# Patient Record
Sex: Female | Born: 1935 | Race: Black or African American | Hispanic: No | Marital: Single | Smoking: Current some day smoker
Health system: Southern US, Community
[De-identification: ages and names within clinical notes are randomized; demographics above are authoritative.]

## PROBLEM LIST (undated history)

## (undated) DIAGNOSIS — T380X5A Adverse effect of glucocorticoids and synthetic analogues, initial encounter: Secondary | ICD-10-CM

## (undated) DIAGNOSIS — Z8701 Personal history of pneumonia (recurrent): Secondary | ICD-10-CM

## (undated) DIAGNOSIS — I471 Supraventricular tachycardia: Secondary | ICD-10-CM

## (undated) DIAGNOSIS — K922 Gastrointestinal hemorrhage, unspecified: Secondary | ICD-10-CM

## (undated) DIAGNOSIS — I422 Other hypertrophic cardiomyopathy: Secondary | ICD-10-CM

## (undated) DIAGNOSIS — I517 Cardiomegaly: Secondary | ICD-10-CM

## (undated) DIAGNOSIS — I480 Paroxysmal atrial fibrillation: Secondary | ICD-10-CM

## (undated) DIAGNOSIS — M069 Rheumatoid arthritis, unspecified: Secondary | ICD-10-CM

## (undated) DIAGNOSIS — K529 Noninfective gastroenteritis and colitis, unspecified: Secondary | ICD-10-CM

## (undated) DIAGNOSIS — M545 Low back pain, unspecified: Secondary | ICD-10-CM

## (undated) DIAGNOSIS — D61818 Other pancytopenia: Secondary | ICD-10-CM

## (undated) DIAGNOSIS — K754 Autoimmune hepatitis: Secondary | ICD-10-CM

## (undated) DIAGNOSIS — I10 Essential (primary) hypertension: Secondary | ICD-10-CM

## (undated) DIAGNOSIS — I4719 Other supraventricular tachycardia: Secondary | ICD-10-CM

## (undated) DIAGNOSIS — M792 Neuralgia and neuritis, unspecified: Secondary | ICD-10-CM

## (undated) DIAGNOSIS — K7589 Other specified inflammatory liver diseases: Secondary | ICD-10-CM

## (undated) DIAGNOSIS — Z8719 Personal history of other diseases of the digestive system: Secondary | ICD-10-CM

## (undated) DIAGNOSIS — N183 Chronic kidney disease, stage 3 (moderate): Principal | ICD-10-CM

## (undated) DIAGNOSIS — G720 Drug-induced myopathy: Secondary | ICD-10-CM

## (undated) DIAGNOSIS — K633 Ulcer of intestine: Secondary | ICD-10-CM

## (undated) DIAGNOSIS — I639 Cerebral infarction, unspecified: Secondary | ICD-10-CM

## (undated) DIAGNOSIS — B3781 Candidal esophagitis: Secondary | ICD-10-CM

## (undated) DIAGNOSIS — J40 Bronchitis, not specified as acute or chronic: Secondary | ICD-10-CM

## (undated) DIAGNOSIS — K259 Gastric ulcer, unspecified as acute or chronic, without hemorrhage or perforation: Secondary | ICD-10-CM

## (undated) DIAGNOSIS — I4891 Unspecified atrial fibrillation: Secondary | ICD-10-CM

## (undated) DIAGNOSIS — N200 Calculus of kidney: Secondary | ICD-10-CM

## (undated) DIAGNOSIS — I441 Atrioventricular block, second degree: Secondary | ICD-10-CM

## (undated) DIAGNOSIS — M1990 Unspecified osteoarthritis, unspecified site: Secondary | ICD-10-CM

## (undated) DIAGNOSIS — K31819 Angiodysplasia of stomach and duodenum without bleeding: Secondary | ICD-10-CM

## (undated) DIAGNOSIS — D631 Anemia in chronic kidney disease: Secondary | ICD-10-CM

## (undated) DIAGNOSIS — I4892 Unspecified atrial flutter: Secondary | ICD-10-CM

## (undated) DIAGNOSIS — D509 Iron deficiency anemia, unspecified: Secondary | ICD-10-CM

## (undated) HISTORY — DX: Bronchitis, not specified as acute or chronic: J40

## (undated) HISTORY — DX: Noninfective gastroenteritis and colitis, unspecified: K52.9

## (undated) HISTORY — PX: CYST REMOVAL HAND: SHX6279

## (undated) HISTORY — DX: Paroxysmal atrial fibrillation: I48.0

## (undated) HISTORY — DX: Atrioventricular block, second degree: I44.1

## (undated) HISTORY — PX: OTHER SURGICAL HISTORY: SHX169

## (undated) HISTORY — DX: Other specified inflammatory liver diseases: K75.89

## (undated) HISTORY — DX: Unspecified osteoarthritis, unspecified site: M19.90

## (undated) HISTORY — DX: Chronic kidney disease, stage 3 (moderate): N18.3

## (undated) HISTORY — DX: Anemia in chronic kidney disease: D63.1

---

## 2000-12-11 ENCOUNTER — Encounter: Payer: Self-pay | Admitting: Family Medicine

## 2000-12-11 ENCOUNTER — Ambulatory Visit (HOSPITAL_COMMUNITY): Admission: RE | Admit: 2000-12-11 | Discharge: 2000-12-11 | Payer: Self-pay | Admitting: Specialist

## 2000-12-15 ENCOUNTER — Other Ambulatory Visit: Admission: RE | Admit: 2000-12-15 | Discharge: 2000-12-15 | Payer: Self-pay | Admitting: Family Medicine

## 2001-12-17 ENCOUNTER — Ambulatory Visit (HOSPITAL_COMMUNITY): Admission: RE | Admit: 2001-12-17 | Discharge: 2001-12-17 | Payer: Self-pay | Admitting: Family Medicine

## 2001-12-17 ENCOUNTER — Encounter: Payer: Self-pay | Admitting: Family Medicine

## 2002-12-23 ENCOUNTER — Ambulatory Visit (HOSPITAL_COMMUNITY): Admission: RE | Admit: 2002-12-23 | Discharge: 2002-12-23 | Payer: Self-pay | Admitting: Family Medicine

## 2004-01-13 ENCOUNTER — Ambulatory Visit (HOSPITAL_COMMUNITY): Admission: RE | Admit: 2004-01-13 | Discharge: 2004-01-13 | Payer: Self-pay | Admitting: Family Medicine

## 2004-03-04 ENCOUNTER — Ambulatory Visit (HOSPITAL_COMMUNITY): Admission: RE | Admit: 2004-03-04 | Discharge: 2004-03-04 | Payer: Self-pay | Admitting: General Surgery

## 2005-01-13 ENCOUNTER — Ambulatory Visit (HOSPITAL_COMMUNITY): Admission: RE | Admit: 2005-01-13 | Discharge: 2005-01-13 | Payer: Self-pay | Admitting: Family Medicine

## 2006-01-17 ENCOUNTER — Ambulatory Visit (HOSPITAL_COMMUNITY): Admission: RE | Admit: 2006-01-17 | Discharge: 2006-01-17 | Payer: Self-pay | Admitting: Family Medicine

## 2006-02-14 ENCOUNTER — Other Ambulatory Visit: Admission: RE | Admit: 2006-02-14 | Discharge: 2006-02-14 | Payer: Self-pay | Admitting: Family Medicine

## 2007-02-26 ENCOUNTER — Ambulatory Visit (HOSPITAL_COMMUNITY): Admission: RE | Admit: 2007-02-26 | Discharge: 2007-02-26 | Payer: Self-pay | Admitting: Family Medicine

## 2008-03-28 ENCOUNTER — Ambulatory Visit (HOSPITAL_COMMUNITY): Admission: RE | Admit: 2008-03-28 | Discharge: 2008-03-28 | Payer: Self-pay | Admitting: Family Medicine

## 2009-04-10 ENCOUNTER — Ambulatory Visit (HOSPITAL_COMMUNITY): Admission: RE | Admit: 2009-04-10 | Discharge: 2009-04-10 | Payer: Self-pay | Admitting: Family Medicine

## 2009-06-17 ENCOUNTER — Ambulatory Visit (HOSPITAL_COMMUNITY): Admission: RE | Admit: 2009-06-17 | Discharge: 2009-06-17 | Payer: Self-pay | Admitting: General Surgery

## 2009-11-27 ENCOUNTER — Emergency Department (HOSPITAL_COMMUNITY): Admission: EM | Admit: 2009-11-27 | Discharge: 2009-11-27 | Payer: Self-pay | Admitting: Emergency Medicine

## 2009-11-28 ENCOUNTER — Emergency Department (HOSPITAL_COMMUNITY): Admission: EM | Admit: 2009-11-28 | Discharge: 2009-11-29 | Payer: Self-pay | Admitting: Emergency Medicine

## 2009-12-03 ENCOUNTER — Encounter: Admission: RE | Admit: 2009-12-03 | Discharge: 2009-12-03 | Payer: Self-pay | Admitting: Family Medicine

## 2010-03-16 LAB — POCT I-STAT, CHEM 8
Calcium, Ion: 1.23 mmol/L (ref 1.12–1.32)
Glucose, Bld: 124 mg/dL — ABNORMAL HIGH (ref 70–99)
HCT: 28 % — ABNORMAL LOW (ref 36.0–46.0)
Hemoglobin: 9.5 g/dL — ABNORMAL LOW (ref 12.0–15.0)
TCO2: 26 mmol/L (ref 0–100)

## 2010-03-19 ENCOUNTER — Other Ambulatory Visit (HOSPITAL_COMMUNITY): Payer: Self-pay | Admitting: Family Medicine

## 2010-03-19 DIAGNOSIS — Z139 Encounter for screening, unspecified: Secondary | ICD-10-CM

## 2010-03-22 LAB — DIFFERENTIAL
Basophils Relative: 0 % (ref 0–1)
Eosinophils Absolute: 0.3 10*3/uL (ref 0.0–0.7)
Lymphs Abs: 1.2 10*3/uL (ref 0.7–4.0)
Neutro Abs: 3.9 10*3/uL (ref 1.7–7.7)
Neutrophils Relative %: 68 % (ref 43–77)

## 2010-03-22 LAB — CBC
MCV: 69.1 fL — ABNORMAL LOW (ref 78.0–100.0)
Platelets: 169 10*3/uL (ref 150–400)
WBC: 5.8 10*3/uL (ref 4.0–10.5)

## 2010-03-22 LAB — BASIC METABOLIC PANEL
BUN: 13 mg/dL (ref 6–23)
Calcium: 9.7 mg/dL (ref 8.4–10.5)
Chloride: 104 mEq/L (ref 96–112)
Creatinine, Ser: 1.06 mg/dL (ref 0.4–1.2)

## 2010-03-22 LAB — SURGICAL PCR SCREEN
MRSA, PCR: NEGATIVE
Staphylococcus aureus: NEGATIVE

## 2010-04-04 DIAGNOSIS — K259 Gastric ulcer, unspecified as acute or chronic, without hemorrhage or perforation: Secondary | ICD-10-CM

## 2010-04-04 DIAGNOSIS — K633 Ulcer of intestine: Secondary | ICD-10-CM

## 2010-04-04 HISTORY — PX: UPPER GASTROINTESTINAL ENDOSCOPY: SHX188

## 2010-04-04 HISTORY — DX: Gastric ulcer, unspecified as acute or chronic, without hemorrhage or perforation: K25.9

## 2010-04-04 HISTORY — DX: Ulcer of intestine: K63.3

## 2010-04-04 HISTORY — PX: COLONOSCOPY: SHX174

## 2010-04-12 ENCOUNTER — Ambulatory Visit (HOSPITAL_COMMUNITY)
Admission: RE | Admit: 2010-04-12 | Discharge: 2010-04-12 | Disposition: A | Discharge status: Home / Self Care | Payer: Medicare Other | Source: Ambulatory Visit | Attending: Family Medicine | Admitting: Family Medicine

## 2010-04-12 DIAGNOSIS — Z139 Encounter for screening, unspecified: Secondary | ICD-10-CM

## 2010-04-12 DIAGNOSIS — Z1231 Encounter for screening mammogram for malignant neoplasm of breast: Secondary | ICD-10-CM | POA: Insufficient documentation

## 2010-04-22 ENCOUNTER — Inpatient Hospital Stay (HOSPITAL_COMMUNITY)
Admission: AD | Admit: 2010-04-22 | Discharge: 2010-04-24 | DRG: 811 | Disposition: A | Discharge status: Home / Self Care | Payer: Medicare Other | Source: Ambulatory Visit | Attending: Otolaryngology | Admitting: Otolaryngology

## 2010-04-22 DIAGNOSIS — K633 Ulcer of intestine: Secondary | ICD-10-CM | POA: Diagnosis present

## 2010-04-22 DIAGNOSIS — I10 Essential (primary) hypertension: Secondary | ICD-10-CM | POA: Diagnosis present

## 2010-04-22 DIAGNOSIS — K254 Chronic or unspecified gastric ulcer with hemorrhage: Secondary | ICD-10-CM | POA: Diagnosis present

## 2010-04-22 DIAGNOSIS — D500 Iron deficiency anemia secondary to blood loss (chronic): Principal | ICD-10-CM | POA: Diagnosis present

## 2010-04-22 DIAGNOSIS — D61818 Other pancytopenia: Secondary | ICD-10-CM | POA: Diagnosis present

## 2010-04-22 DIAGNOSIS — J069 Acute upper respiratory infection, unspecified: Secondary | ICD-10-CM | POA: Diagnosis present

## 2010-04-22 DIAGNOSIS — K5730 Diverticulosis of large intestine without perforation or abscess without bleeding: Secondary | ICD-10-CM | POA: Diagnosis present

## 2010-04-22 DIAGNOSIS — I509 Heart failure, unspecified: Secondary | ICD-10-CM | POA: Diagnosis present

## 2010-04-22 DIAGNOSIS — T394X5A Adverse effect of antirheumatics, not elsewhere classified, initial encounter: Secondary | ICD-10-CM | POA: Diagnosis present

## 2010-04-22 LAB — COMPREHENSIVE METABOLIC PANEL
Albumin: 2.7 g/dL — ABNORMAL LOW (ref 3.5–5.2)
BUN: 12 mg/dL (ref 6–23)
Creatinine, Ser: 1.25 mg/dL — ABNORMAL HIGH (ref 0.4–1.2)
GFR calc Af Amer: 50 mL/min — ABNORMAL LOW (ref >60)
Potassium: 4.5 mEq/L (ref 3.5–5.1)
Total Protein: 7.1 g/dL (ref 6.0–8.3)

## 2010-04-22 LAB — CBC
HCT: 16.2 % — ABNORMAL LOW (ref 36.0–46.0)
Hemoglobin: 4.6 g/dL — CL (ref 12.0–15.0)
RBC: 2.83 MIL/uL — ABNORMAL LOW (ref 3.87–5.11)
WBC: 2.5 10*3/uL — ABNORMAL LOW (ref 4.0–10.5)

## 2010-04-22 LAB — PROTIME-INR: INR: 1.11 (ref 0.00–1.49)

## 2010-04-22 LAB — APTT: aPTT: 30 seconds (ref 24–37)

## 2010-04-22 LAB — DIFFERENTIAL
Eosinophils Relative: 0 % (ref 0–5)
Monocytes Relative: 12 % (ref 3–12)
Neutrophils Relative %: 43 % (ref 43–77)

## 2010-04-22 LAB — HEMOCCULT GUIAC POC 1CARD (OFFICE): Fecal Occult Bld: POSITIVE

## 2010-04-22 LAB — LACTATE DEHYDROGENASE: LDH: 153 U/L (ref 94–250)

## 2010-04-23 ENCOUNTER — Inpatient Hospital Stay (HOSPITAL_COMMUNITY): Payer: Medicare Other

## 2010-04-23 DIAGNOSIS — R195 Other fecal abnormalities: Secondary | ICD-10-CM

## 2010-04-23 DIAGNOSIS — K253 Acute gastric ulcer without hemorrhage or perforation: Secondary | ICD-10-CM

## 2010-04-23 DIAGNOSIS — D509 Iron deficiency anemia, unspecified: Secondary | ICD-10-CM

## 2010-04-23 DIAGNOSIS — Z791 Long term (current) use of non-steroidal anti-inflammatories (NSAID): Secondary | ICD-10-CM

## 2010-04-23 DIAGNOSIS — K2960 Other gastritis without bleeding: Secondary | ICD-10-CM

## 2010-04-23 LAB — CBC
HCT: 21.9 % — ABNORMAL LOW (ref 36.0–46.0)
Hemoglobin: 7.1 g/dL — ABNORMAL LOW (ref 12.0–15.0)
MCV: 63.7 fL — ABNORMAL LOW (ref 78.0–100.0)
RBC: 3.44 MIL/uL — ABNORMAL LOW (ref 3.87–5.11)
WBC: 3.8 10*3/uL — ABNORMAL LOW (ref 4.0–10.5)

## 2010-04-23 LAB — LIPID PANEL
Cholesterol: 108 mg/dL (ref 0–200)
LDL Cholesterol: 79 mg/dL (ref 0–99)
Total CHOL/HDL Ratio: 6.4 RATIO
VLDL: 12 mg/dL (ref 0–40)

## 2010-04-23 LAB — DIFFERENTIAL
Basophils Absolute: 0 10*3/uL (ref 0.0–0.1)
Eosinophils Relative: 3 % (ref 0–5)
Lymphocytes Relative: 35 % (ref 12–46)
Monocytes Relative: 12 % (ref 3–12)
Neutrophils Relative %: 50 % (ref 43–77)

## 2010-04-23 LAB — HEMOGLOBIN AND HEMATOCRIT, BLOOD: HCT: 25.9 % — ABNORMAL LOW (ref 36.0–46.0)

## 2010-04-23 LAB — IRON AND TIBC: UIBC: 352 ug/dL

## 2010-04-23 LAB — BASIC METABOLIC PANEL
BUN: 8 mg/dL (ref 6–23)
Calcium: 8.9 mg/dL (ref 8.4–10.5)
Chloride: 107 mEq/L (ref 96–112)
Creatinine, Ser: 1.07 mg/dL (ref 0.4–1.2)
GFR calc Af Amer: >60 mL/min (ref >60)

## 2010-04-23 LAB — VITAMIN B12: Vitamin B-12: 417 pg/mL (ref 211–911)

## 2010-04-23 LAB — TSH: TSH: 0.737 u[IU]/mL (ref 0.350–4.500)

## 2010-04-23 LAB — FERRITIN: Ferritin: 9 ng/mL — ABNORMAL LOW (ref 10–291)

## 2010-04-23 LAB — FOLATE: Folate: 9.9 ng/mL

## 2010-04-24 ENCOUNTER — Other Ambulatory Visit (INDEPENDENT_AMBULATORY_CARE_PROVIDER_SITE_OTHER): Payer: Self-pay | Admitting: Internal Medicine

## 2010-04-24 DIAGNOSIS — K633 Ulcer of intestine: Secondary | ICD-10-CM

## 2010-04-24 DIAGNOSIS — K5730 Diverticulosis of large intestine without perforation or abscess without bleeding: Secondary | ICD-10-CM

## 2010-04-24 LAB — CBC
Hemoglobin: 8.2 g/dL — ABNORMAL LOW (ref 12.0–15.0)
Platelets: 132 10*3/uL — ABNORMAL LOW (ref 150–400)
RBC: 3.95 MIL/uL (ref 3.87–5.11)
WBC: 2.8 10*3/uL — ABNORMAL LOW (ref 4.0–10.5)

## 2010-04-24 LAB — DIFFERENTIAL
Basophils Absolute: 0 10*3/uL (ref 0.0–0.1)
Basophils Relative: 1 % (ref 0–1)
Eosinophils Absolute: 0.1 10*3/uL (ref 0.0–0.7)
Lymphocytes Relative: 36 % (ref 12–46)
Monocytes Relative: 14 % — ABNORMAL HIGH (ref 3–12)
Neutro Abs: 1.3 10*3/uL — ABNORMAL LOW (ref 1.7–7.7)
Neutrophils Relative %: 44 % (ref 43–77)

## 2010-04-25 LAB — CROSSMATCH
Antibody Screen: NEGATIVE
Unit division: 0

## 2010-04-26 LAB — H. PYLORI ANTIBODY, IGG: H Pylori IgG: <0.4 {ISR}

## 2010-04-28 NOTE — Discharge Summary (Signed)
Eileen Santana, Eileen Santana               ACCOUNT NO.:  [id number]  MEDICAL RECORD NO.:  [id number]           PATIENT TYPE:  I  LOCATION:  A302                          FACILITY:  [facility]  PHYSICIAN:  Georgina Quint. Kobee Medlen, MDDATE OF BIRTH:  1930/09/20  DATE OF ADMISSION:  04/22/2010 DATE OF DISCHARGE:  04/21/2012LH                              DISCHARGE SUMMARY   DISCHARGE DIAGNOSES: 1. Severe anemia with hemoglobin of 4.6 due to nonsteroidal anti-     inflammatories-induced gastrointestinal ulcers. 2. Gastric ulcers. 3. Ulcer at the ileocecal valve and two erosions at sigmoid colon,     nonsteroidal anti-inflammatories induced. 4. Cough, likely due to allergies and upper respiratory tract illness     (improved). 5. Small bilateral pleural effusions, likely due to mild congestive     heart failure (resolved). 6. Hypertension, on therapy. 7. Pancytopenia, of unclear etiology.  CONSULTATIONS:  Lionel December, MD, Gastroenterology.  PROCEDURES:  Colonoscopy and upper endoscopy by Dr. Karilyn Cota.  DISCHARGE MEDICATIONS: 1. Ferrous sulfate twice a day. 2. Hycodan cough syrup 5 mL q.i.d. p.r.n. 3. Protonix 40 mg twice a day. 4. Claritin 10 mg daily p.r.n. 5. Cozaar 100 mg one tablet daily. 6. Bengay cream twice daily p.r.n. 7. Verapamil SR 240 mg daily.  ALLERGIES:  NAPROXEN, DIAZEPAM, CONTRAST MEDIA.  FOLLOWUP APPOINTMENTS:  With Dr. Mirna Mires next week with CBC and BMET. Followup appointment with Dr. Karilyn Cota in 4-6 weeks.  SPECIAL INSTRUCTIONS: 1. Resume activity as tolerated. 2. Avoid nonsteroidal anti-inflammatory medicines. 3. Okay to resume baby aspirin in 2 weeks.  HISTORY:  The patient is a 77 year old very active female who was admitted from home with hemoglobin of 4.6.  She was seen prior by Dr. Loleta Chance in the office with complaint of 3-4 weeks of progressive fatigue and malaise and hemoglobin was drawn.  Interestingly enough, she continued to bowl twice a week up until the  day of admission.  There was no chest pain.  There was some shortness of breath.  She was sick with cough for a few days prior to her admission, no fever or colored mucous.  HOSPITAL COURSE:  During the course of hospitalization, the patient was admitted to telemetry.  She received 2 units of red blood cells following her admission.  GI consultation with Dr. Karilyn Cota was obtained, and she underwent upper endoscopy first and lower endoscopy on the day of discharge.  She was found to have multiple ulcers by both studies. She was put on twice a day Protonix.  Her NSAIDs were discontinued.  On the day of discharge, she received another unit of red blood cells.  At the moment of discharge, she is feeling well.  She has no active complaints. VITAL SIGNS:  Her blood pressure this morning was 150/77, heart rate 86, respirations 18, temperature 97.9, O2 sats 97% on room air.  She is in no acute distress. HEENT:  Moist mucosa.  No pallor. NECK:  Supple. LUNGS:  Clear.  No wheezes or rales. HEART:  S1 and S2.  No gallop. ABDOMEN:  Soft, nontender.  No organomegaly, no masses felt. EXTREMITIES:  Lower extremities without edema. NEUROLOGIC:  She is alert, oriented, cooperative.  LABORATORY DATA:  White count 2.8, hemoglobin 8.2, MCV 66, platelets 132,000.  Chest x-ray with interstitial lung disease and small pleural effusion bilaterally. EGD on April 23, 2010, with gastric ulcers. Colonoscopy on April 24, 2010, with scattered diverticula throughout colon, 2 x 1 cm ulcer at the ileocecal valve, two erosions at sigmoid colon, external hemorrhoids.     Georgina Quint. Quincee Gittens, MD     AVP/MEDQ  D:  04/24/2010  T:  04/24/2010  Job:  [id number]  cc:   [name]. Loleta Chance, MD Fax: [phone]  Lionel December, M.D. Fax: [phone]  Electronically Signed by Jacinta Shoe MD on 04/28/2010 12:10:41 AM

## 2010-05-09 NOTE — Op Note (Signed)
NAMESEMA, Eileen Santana               ACCOUNT NO.:  [id number]  MEDICAL RECORD NO.:  [id number]           PATIENT TYPE:  I  LOCATION:  A302                          FACILITY:  [facility]  PHYSICIAN:  Lionel December, M.D.    DATE OF BIRTH:  06-28-1930  DATE OF PROCEDURE:  04/24/2010 DATE OF DISCHARGE:  04/24/2010                              OPERATIVE REPORT   PROCEDURE:  Colonoscopy.  SURGEON:  Lionel December, MD  INDICATIONS:  [name] is a 76 year old Afro American female who presents with profound microcytic anemia confirmed to be due to iron deficiency.  She had EGD yesterday which revealed two very small antral ulcers and erosions but no stigmata of bleeding were identified.  She is therefore undergoing diagnostic colonoscopy.  The patient's last colonoscopy was in March 2006.  Procedures were reviewed with the patient.  Informed consent was obtained.  MEDICATIONS FOR CONSCIOUS SEDATION:  Demerol 25 mg IV, Versed 3 mg IV.  FINDINGS:  Procedure performed in endoscopy suite.  The patient's vital signs and O2 sats were monitored during the procedure and remained stable.  The patient was placed in left lateral recumbent position. Rectal examination performed.  No abnormality noted on external or digital exam.  Pentax videoscope was placed through rectum and advanced under vision into sigmoid colon and beyond.  Preparation was excellent. She has scattered diverticula throughout the colon.  The scope was passed into cecum which was identified by ileocecal valve and appendiceal orifice.  Pictures were taken for the record.  There was approximately 2 x 1 cm ulcer on the superior lip of ileocecal valve with a clean base.  Short segment of GI was also examined and was normal.  On the way out, multiple biopsies were taken from this ulcer margin.  As the scope was withdrawn, rest of the colonic mucosa was carefully examined and there were two small erosions at sigmoid colon but no  other abnormalities noted.  Rectal mucosa was normal.  Scope was retroflexed to examine.  Anorectal junction was small.  Hemorrhoids were noted below the dentate line.  Endoscope was withdrawn.  Withdrawal time was over 10 minutes.  The patient tolerated the procedure well.  FINAL DIAGNOSES: 1. Normal terminal ileum. 2. A single large ulcer over ileocecal valve felt to be a result of     chronic NSAID therapy.  Multiple biopsies taken for histology. 3. Pancolonic diverticulosis. 4. Two erosions at sigmoid colon. 5. External hemorrhoids.  I suspect, [name] has been losing blood chronically from NSAID induced injury to her upper and lower GI tract and she may also have similar injury to her small bowel.  Agree with giving another unit of PRBCs today.  RECOMMENDATIONS: 1. She will need to stay on ferrous sulfate 325 mg twice daily for     several months. 2. Aspirin can be resumed at 81 mg after 2 weeks. 3. The patient should not take any NSAIDs.  I will be contacting the patient with results of H. pylori and biopsy next week.     Lionel December, M.D.     NR/MEDQ  D:  04/24/2010  T:  04/24/2010  Job:  [id number]  Electronically Signed by Lionel December M.D. on 05/09/2010 09:53:58 PM

## 2010-05-09 NOTE — Consult Note (Signed)
Eileen Santana, Eileen Santana               ACCOUNT NO.:  [id number]  MEDICAL RECORD NO.:  [id number]           PATIENT TYPE:  I  LOCATION:  A302                          FACILITY:  [facility]  PHYSICIAN:  Lionel December, M.D.    DATE OF BIRTH:  01/20/1930  DATE OF CONSULTATION: DATE OF DISCHARGE:                                CONSULTATION   REASON FOR CONSULTATION:  Anemia.  HISTORY OF PRESENT ILLNESS:  This patient is a 77 year old black female admitted for anemia.  She was found to have a hemoglobin of 4.6 and hematocrit of 16.2.  Her iron study was less than 10, UIBC was 352, vitamin B12 was 417, folate 9.9, ferritin was 9.  She does take aspirin on a daily basis 81 mg and 2 Aleve a day for her arthritis.  She states she began feeling weak about 4 weeks ago.  Also Monday she was bowling and felt very weak in her legs and saw Dr. Loleta Chance on Tuesday.  Apparently blood work was drawn and he called her Thursday and she was directly admitted to the hospital.  She says her bowel movements have been light brown.  She denies having any black tarry stools or bright red rectal bleeding.  There has been no epigastric pain or acid reflux.  She does give a remote history of having anemia.  She says she has lost about 3- 1/2 pounds per Dr. Loleta Chance.  She says her appetite has not been good for 4- 1/2 weeks due to her allergies.  PAST MEDICAL HISTORY:  Anemia.  Her last colonoscopy was in 2006 by Dr. Katrinka Blazing, which was normal.  She has a history of hypertension and high cholesterol.  ALLERGIES:  She is allergic to VALIUM, NAPROXEN, and IVP DYE.  SURGERIES:  She has had some arthritic nodules removed from her right elbow in June of last year.  SOCIAL HISTORY:  She smokes very little.  She does not drink or do drugs.  She is retired from [employer]  She has 4 children in good health.  She lives with 2 of her daughters.  HOME MEDICATIONS:  Verapamil 240 mg a day, losartan 100 mg a day, loratadine 10 mg  a day, aspirin 81 mg a day, Aleve 2 a day.  OBJECTIVE:  VITALS:  Her temperature is 98.9, pulse 76, respirations 16, her blood pressure is 115/69, O2 sats 94%, her weight is 69.2 kg, her height is 6 feet 5 inches. HEENT:  Sclerae are anicteric.  Her conjunctivae is pale.  She is edentulous.  She does have dentures, however.  Her oral mucosa is moist. There are no lesions. NECK:  Her thyroid is normal.  There is no cervical lymphadenopathy. LUNGS:  Clear. HEART:  Regular rate and rhythm. ABDOMEN:  Soft.  Bowel sounds are positive.  No masses.  She had fecal occult blood, which was positive. EXTREMITIES:  She has 2+ pulses to her lower extremities and there is no edema.  She has been transfused.  WBC count is 3.8, RBC 3.44, hemoglobin is 7.1, hematocrit is 21.9, MCV is low at 63.7, platelets of 143.  Anemia  profile in the history of physical at 6:17 this morning, her hemoglobin 8.3, hematocrit is 25.9.  BMET today; sodium 136, potassium 3.9, chloride 107, CO2 of 23, glucose 82, BUN 8, creatinine is 1.07, calcium is 8.9.  ASSESSMENT:  Eileen Santana is a 77 year old female admitted with anemia. Her stool has been guaiac positive.  She is on chronic NSAID therapy and also takes Aleve daily.  Peptic ulcer disease needs to be ruled out.  RECOMMENDATIONS:  We will schedule an EGD with Dr. Karilyn Cota today.  She may have clear liquids this morning and then n.p.o. and I have discussed this case with Dr. Karilyn Cota.    ______________________________ Dorene Ar, NP   ______________________________ Lionel December, M.D.    TS/MEDQ  D:  04/23/2010  T:  04/23/2010  Job:  [id number]  Electronically Signed by Dorene Ar PA on 04/30/2010 10:41:38 AM Electronically Signed by Lionel December M.D. on 05/09/2010 09:52:04 PM

## 2010-05-09 NOTE — Op Note (Signed)
  Eileen Santana, PEDERSON               ACCOUNT NO.:  [id number]  MEDICAL RECORD NO.:  [id number]           PATIENT TYPE:  I  LOCATION:  A302                          FACILITY:  [facility]  PHYSICIAN:  Lionel December, M.D.    DATE OF BIRTH:  Jun 12, 1930  DATE OF PROCEDURE:  04/23/2010 DATE OF DISCHARGE:                              OPERATIVE REPORT   PROCEDURE:  Esophagogastroduodenoscopy.  INDICATION:  [name] is a 77 year old African American female who presents with profound weakness and noted to have a hemoglobin of 4.6. Iron studies confirmed iron-deficiency anemia.  She has been on aspirin and also takes 2 Aleve every day that she is on for several months.  She has not experienced any hematemesis, melena, or rectal bleeding.  The patient's last screening colonoscopy was in March 2006 and was normal.  She is undergoing diagnostic EGD.  She has received 3 units of PRBCs and feels better and her hemoglobin is up to 8.3.  Procedure risks were reviewed with the patient.  Informed consent was obtained.  MEDICATIONS FOR CONSCIOUS SEDATION:  Cetacaine spray for pharyngeal topical anesthesia, Demerol 25 mg IV, Versed 3 mg IV.  FINDINGS:  Procedure performed in endoscopy suite.  The patient's vital signs and O2 saturations were monitored during the procedure and remained stable.  The patient was placed in left lateral recumbent position and Pentax videoscope was passed through oropharynx without any difficulty into esophagus.  Esophagus:  Mucosa of the esophagus was normal.  GE junction was at 39 cm from the incisors and was unremarkable.  Stomach:  It was empty and distended very well with insufflation.  Folds of proximal stomach are normal.  Examination of mucosa at the body was normal.  In the antrum, there were two 3-4 mm ulcers with a clean base. Multiple erosions were noted within 2-3 cm of pyloric channel.  No stigmata of bleeding noted.  Pyloric channel was patent.   Angularis, fundus, and cardia were examined by retroflexing scope and were normal.  Duodenum:  Bulbar mucosa was normal.  Scope was passed through second part of duodenum where mucosa and folds are normal.  Endoscope was withdrawn.  The patient tolerated the procedure well.  FINAL DIAGNOSIS:  Two small gastric ulcers at antrum along with multiple antral erosions.  No stigmata of bleeding.  This could be potential source of intermittent chronic blood loss in the setting of chronic nonsteroidal anti-inflammatory drug therapy.  I would need to rule out occult colonic neoplasm.  RECOMMENDATIONS:  Colonoscopy to be performed in a.m.  H. pylori serology will be checked, for colonoscopy later today.     Lionel December, M.D.     NR/MEDQ  D:  04/23/2010  T:  04/24/2010  Job:  [id number]  cc:   [name]. Loleta Chance, MD Fax: [phone]  Electronically Signed by Lionel December M.D. on 05/09/2010 09:52:15 PM

## 2010-05-10 NOTE — H&P (Signed)
NAMEPACEY, Eileen Santana               ACCOUNT NO.:  [id number]  MEDICAL RECORD NO.:  [id number]           PATIENT TYPE:  I  LOCATION:  A302                          FACILITY:  [facility]  PHYSICIAN:  Kendi Defalco L. Lendell Caprice, MDDATE OF BIRTH:  11-20-1930  DATE OF ADMISSION:  04/22/2010 DATE OF DISCHARGE:  LH                             HISTORY & PHYSICAL   CHIEF COMPLAINT:  "Washed out."  HISTORY OF PRESENT ILLNESS:  [name] is a pleasant 77 year old black female patient of Dr. Loleta Chance who was directly admitted from home with anemia.  She saw him in the office for about 3-4 weeks of progressive fatigue, malaise.  She had routine blood work done and was found to have a hemoglobin just over 4.  Last year per e-chart, she had a hemoglobin of about 9.5.  She takes an aspirin a day and 2 Aleve a day.  She has noted no bleeding, bloody stools, or nausea.  Her appetite has diminished.  She denies no dysphasia, reflux, abdominal pain.  She had a blood transfusion once many years ago.  She reportedly had colonoscopy by Dr. Katrinka Blazing in 2006, but I do not have the results.  She has never had upper endoscopy.  She has no history of GI bleed.  She has had no chest pain.  No shortness of breath.  She is usually quite active and was looking forward to the [place] next week.  PAST MEDICAL HISTORY:  Hypertension, hyperlipidemia which she has refused treatment per Dr. Loleta Chance.  MEDICATIONS:  Aspirin 81 mg a day, Aleve 220 mg 2 tablets daily, Cozaar 100 mg a day, verapamil SR 240 mg a day.  Ben-Gay topically twice a day as needed, Claritin and was just prescribed 10 mg a day.  ALLERGIES:  She has an allergy to IVP DYE and UNCOATED NAPROSYN.  She has weakness and severe confusion with VALIUM.  SOCIAL HISTORY:  She a is single.  She lives with 2 daughters.  She has 4 daughters who are grown.  She smokes a few cigarettes a day or every other day.  She denies having ever been a heavy smoker.  She does  not drink.  She has no history of drug use.  FAMILY HISTORY:  Her daughter has ulcerative colitis.  No family history of cancer or blood dyscrasias.  HEALTH MAINTENANCE:  She had her mammogram a few weeks ago which was reportedly normal.  She had a Pap smear about 2 years ago with pelvic exam that was unremarkable per her report.  REVIEW OF SYSTEMS:  Systems reviewed and as above otherwise negative.  PHYSICAL EXAMINATION:  VITAL SIGNS:  On physical examination temperature is 97.8, heart rate 89, blood pressure 130/77.  Weight is 69.2 kg GENERAL:  The patient is an elderly black female in no acute distress. HEENT: Normocephalic, atraumatic.  Pupils are equal, round, reactive to light.  She has pale conjunctiva.  Moist mucous membranes. NECK:  Supple.  Shoddy submandibular lymphadenopathy.  No lymph.  No carotid bruits.  No thyromegaly. LUNGS:  Clear to auscultation bilaterally without wheezes, rhonchi, or rales. CARDIOVASCULAR:  Regular rate and  rhythm without murmurs, gallops, or rubs. ABDOMEN:  Normal bowel sounds, soft, nontender, nondistended. GU:  Deferred. Rectal:  She has no external hemorrhoids.  She has no mass.  She has brown stool which has been Hemoccult but results are pending. EXTREMITIES:  She has 1+ pitting edema. SKIN: No rash. PSYCHIATRIC:  Normal affect. NEUROLOGIC:  Sensory motor exam are grossly intact.  She does have chronic disconjugate gaze. PSYCHIATRIC:  Normal affect.  LABORATORY DATA:  White blood cell count is 2500 with a normal differential.  Hemoglobin is 4.6, hematocrit 16.2.  MCV is 57, platelet count 161.  PT/PTT normal.  Complete metabolic panel significant for a creatinine of 1.25, SGOT of 67, albumin of 2.7.  Normal total protein, normal calcium.  ASSESSMENT/PLAN: 1. Severe microcytic anemia, suspect acute on chronic:  The patient     agrees to blood transfusion.  I await her Hemoccult results.  I     have also sent blood for an anemia  panel.  I will add an LDH.  I     have discussed the case with Dr. Karilyn Cota who will consult for upper     endoscopy.  She does take NSAIDs and she may have chronic GI blood     loss.  He also can pull out Dr. Michaelle Copas colonoscopy report. 2. Mild leukopenia and neutropenia:  I will repeat her CBC.  Her     platelet count is normal but this brings of concern of a an     alternate etiology for the anemia.  If GI workup is negative we     will explore other etiologies. 3. Hypertension:  I will resume her verapamil female with hold     parameters.  I will hold her Cozaar for now.  To avoid hypotension. 4. Rheumatoid arthritis:  She does have a history of rheumatoid     arthritis but is maintained on Aleve alone. 5. Reported history of hyperlipidemia.  Apparently refused treatment     so she and her daughter reports that her cholesterol "went back to     normal." 6. Mild renal insufficiency.  I will repeat a basic metabolic panel     tomorrow.     Lenell Mcconnell L. Lendell Caprice, MD     CLS/MEDQ  D:  04/22/2010  T:  04/23/2010  Job:  [id number]  cc:   [name]. Loleta Chance, MD Fax: [phone]  Electronically Signed by Crista Curb MD on 05/10/2010 08:06:27 AM

## 2010-05-21 NOTE — H&P (Signed)
NAMEELDINE, RENCHER               ACCOUNT NO.:  [id number]   MEDICAL RECORD NO.:  [id number]          PATIENT TYPE:  AMB   LOCATION:  DAY                           FACILITY:  [facility]   PHYSICIAN:  Jerolyn Shin C. Katrinka Blazing, M.D.   DATE OF BIRTH:  22-Nov-1930   DATE OF ADMISSION:  DATE OF DISCHARGE:  LH                                HISTORY & PHYSICAL   HISTORY OF PRESENT ILLNESS:  A 77 year old female referred for screening  colonoscopy. She has had difficulty with bowel movements. There has been no  documented rectal bleeding. There is no family history of colon cancer. She  does have a history of anemia.   PAST MEDICAL HISTORY:  Positive for hypertension, osteoarthritis, and  hyperlipidemia.   MEDICATIONS:  Verapamil 240 mg q.d., Celebrex 200 mg q.d., Hyzaar 100/12.5  mg q.d.   PHYSICAL EXAMINATION:  VITAL SIGNS:  Blood pressure 140/80, pulse 72,  respiratory rate 20, weight 168 pounds.  HEENT:  Unremarkable.  NECK:  Supple. No jugular venous distention or bruit.  CHEST:  Clear to auscultation.  HEART:  Regular rate and rhythm without murmur, rub, or gallop.  ABDOMEN:  Soft, nontender, no masses.  EXTREMITIES:  No clubbing, cyanosis, or edema. Positive crepitus and  stiffness of both knees with mild synovial hypertrophy.  NEUROLOGIC:  No focal motor, sensory, or cerebellar deficits.   IMPRESSION:  1.  Need for screening colonoscopy.  2.  Anemia.  3.  Hypertension.  4.  Osteoarthritis.  5.  Hyperlipidemia.   PLAN:  Colonoscopy.      LCS/MEDQ  D:  03/04/2004  T:  03/04/2004  Job:  [id number]

## 2010-06-24 ENCOUNTER — Other Ambulatory Visit (HOSPITAL_COMMUNITY): Payer: Self-pay | Admitting: Oncology

## 2010-06-24 ENCOUNTER — Encounter (HOSPITAL_COMMUNITY): Payer: Medicare Other | Attending: Oncology

## 2010-06-24 DIAGNOSIS — D61818 Other pancytopenia: Secondary | ICD-10-CM | POA: Insufficient documentation

## 2010-06-24 DIAGNOSIS — D509 Iron deficiency anemia, unspecified: Secondary | ICD-10-CM | POA: Insufficient documentation

## 2010-06-24 DIAGNOSIS — D709 Neutropenia, unspecified: Secondary | ICD-10-CM | POA: Insufficient documentation

## 2010-06-24 DIAGNOSIS — M069 Rheumatoid arthritis, unspecified: Secondary | ICD-10-CM | POA: Insufficient documentation

## 2010-06-24 DIAGNOSIS — I10 Essential (primary) hypertension: Secondary | ICD-10-CM | POA: Insufficient documentation

## 2010-06-24 LAB — DIFFERENTIAL
Basophils Relative: 0 % (ref 0–1)
Eosinophils Absolute: 0 10*3/uL (ref 0.0–0.7)
Eosinophils Relative: 1 % (ref 0–5)
Lymphocytes Relative: 40 % (ref 12–46)
Monocytes Relative: 16 % — ABNORMAL HIGH (ref 3–12)
Neutro Abs: 1.3 10*3/uL — ABNORMAL LOW (ref 1.7–7.7)
Neutrophils Relative %: 43 % (ref 43–77)

## 2010-06-24 LAB — CBC
HCT: 33.6 % — ABNORMAL LOW (ref 36.0–46.0)
Hemoglobin: 11 g/dL — ABNORMAL LOW (ref 12.0–15.0)
RBC: 4.73 MIL/uL (ref 3.87–5.11)

## 2010-06-24 LAB — ANTIBODY SCREEN: Antibody Screen: NEGATIVE

## 2010-06-24 LAB — LACTATE DEHYDROGENASE: LDH: 204 U/L (ref 94–250)

## 2010-06-25 LAB — DIRECT ANTIGLOBULIN TEST (NOT AT ARMC)
DAT, IgG: NEGATIVE
DAT, complement: NEGATIVE

## 2010-06-25 LAB — HAPTOGLOBIN: Haptoglobin: 231 mg/dL — ABNORMAL HIGH (ref 30–200)

## 2010-07-08 ENCOUNTER — Other Ambulatory Visit (HOSPITAL_COMMUNITY): Payer: Self-pay | Admitting: Oncology

## 2010-07-08 ENCOUNTER — Encounter (HOSPITAL_COMMUNITY): Payer: Medicare Other | Attending: Oncology

## 2010-07-08 DIAGNOSIS — I10 Essential (primary) hypertension: Secondary | ICD-10-CM | POA: Insufficient documentation

## 2010-07-08 DIAGNOSIS — D649 Anemia, unspecified: Secondary | ICD-10-CM

## 2010-07-08 DIAGNOSIS — D61818 Other pancytopenia: Secondary | ICD-10-CM | POA: Insufficient documentation

## 2010-07-08 DIAGNOSIS — M069 Rheumatoid arthritis, unspecified: Secondary | ICD-10-CM | POA: Insufficient documentation

## 2010-07-08 DIAGNOSIS — D709 Neutropenia, unspecified: Secondary | ICD-10-CM | POA: Insufficient documentation

## 2010-07-08 DIAGNOSIS — D509 Iron deficiency anemia, unspecified: Secondary | ICD-10-CM | POA: Insufficient documentation

## 2010-07-08 LAB — DIFFERENTIAL
Basophils Relative: 2 % — ABNORMAL HIGH (ref 0–1)
Eosinophils Absolute: 0.1 10*3/uL (ref 0.0–0.7)
Eosinophils Relative: 5 % (ref 0–5)
Lymphs Abs: 1.2 10*3/uL (ref 0.7–4.0)

## 2010-07-08 LAB — CBC
MCV: 72.5 fL — ABNORMAL LOW (ref 78.0–100.0)
Platelets: 127 10*3/uL — ABNORMAL LOW (ref 150–400)
RDW: 19.3 % — ABNORMAL HIGH (ref 11.5–15.5)
WBC: 2.5 10*3/uL — ABNORMAL LOW (ref 4.0–10.5)

## 2010-07-08 LAB — RETICULOCYTES
Retic Count, Absolute: 33.1 10*3/uL (ref 19.0–186.0)
Retic Ct Pct: 0.7 % (ref 0.4–3.1)

## 2010-07-09 ENCOUNTER — Encounter (HOSPITAL_COMMUNITY): Payer: Medicare Other | Admitting: Oncology

## 2010-07-09 DIAGNOSIS — D72819 Decreased white blood cell count, unspecified: Secondary | ICD-10-CM

## 2010-07-09 DIAGNOSIS — D696 Thrombocytopenia, unspecified: Secondary | ICD-10-CM

## 2010-09-02 ENCOUNTER — Encounter (HOSPITAL_COMMUNITY): Payer: Medicare Other | Attending: Oncology

## 2010-09-02 DIAGNOSIS — D61818 Other pancytopenia: Secondary | ICD-10-CM | POA: Insufficient documentation

## 2010-09-02 DIAGNOSIS — R700 Elevated erythrocyte sedimentation rate: Secondary | ICD-10-CM

## 2010-09-02 LAB — DIFFERENTIAL
Basophils Absolute: 0 10*3/uL (ref 0.0–0.1)
Lymphocytes Relative: 50 % — ABNORMAL HIGH (ref 12–46)
Lymphs Abs: 1.4 10*3/uL (ref 0.7–4.0)
Monocytes Absolute: 0.5 10*3/uL (ref 0.1–1.0)
Neutro Abs: 0.8 10*3/uL — ABNORMAL LOW (ref 1.7–7.7)

## 2010-09-02 LAB — IRON AND TIBC
Saturation Ratios: 9 % — ABNORMAL LOW (ref 20–55)
TIBC: 339 ug/dL (ref 250–470)
UIBC: 307 ug/dL (ref 125–400)

## 2010-09-02 LAB — CBC
HCT: 24.4 % — ABNORMAL LOW (ref 36.0–46.0)
Platelets: 129 10*3/uL — ABNORMAL LOW (ref 150–400)
RBC: 3.34 MIL/uL — ABNORMAL LOW (ref 3.87–5.11)
RDW: 17.5 % — ABNORMAL HIGH (ref 11.5–15.5)
WBC: 2.7 10*3/uL — ABNORMAL LOW (ref 4.0–10.5)

## 2010-09-02 LAB — SEDIMENTATION RATE: Sed Rate: 120 mm/hr — ABNORMAL HIGH (ref 0–22)

## 2010-09-02 NOTE — Progress Notes (Signed)
Labs drawn today for cbc/diff,esr,ferr,feibc

## 2010-09-03 ENCOUNTER — Encounter (HOSPITAL_BASED_OUTPATIENT_CLINIC_OR_DEPARTMENT_OTHER): Payer: Medicare Other | Admitting: Oncology

## 2010-09-03 ENCOUNTER — Ambulatory Visit (HOSPITAL_COMMUNITY): Payer: Medicare Other | Admitting: Oncology

## 2010-09-03 ENCOUNTER — Encounter (HOSPITAL_COMMUNITY): Payer: Self-pay | Admitting: Oncology

## 2010-09-03 ENCOUNTER — Other Ambulatory Visit (HOSPITAL_COMMUNITY): Payer: Medicare Other

## 2010-09-03 DIAGNOSIS — D61818 Other pancytopenia: Secondary | ICD-10-CM | POA: Insufficient documentation

## 2010-09-03 DIAGNOSIS — R161 Splenomegaly, not elsewhere classified: Secondary | ICD-10-CM

## 2010-09-03 DIAGNOSIS — R700 Elevated erythrocyte sedimentation rate: Secondary | ICD-10-CM

## 2010-09-03 LAB — CBC
HCT: 22.4 % — ABNORMAL LOW (ref 36.0–46.0)
Hemoglobin: 7.3 g/dL — ABNORMAL LOW (ref 12.0–15.0)
MCV: 72.3 fL — ABNORMAL LOW (ref 78.0–100.0)
RBC: 3.1 MIL/uL — ABNORMAL LOW (ref 3.87–5.11)
RDW: 17.7 % — ABNORMAL HIGH (ref 11.5–15.5)
WBC: 2.6 10*3/uL — ABNORMAL LOW (ref 4.0–10.5)

## 2010-09-03 NOTE — Patient Instructions (Signed)
[facility]  Discharge Instructions  RECOMMENDATIONS MADE BY THE CONSULTANT AND ANY TEST RESULTS WILL BE SENT TO YOUR REFERRING DOCTOR.   EXAM FINDINGS BY MD TODAY AND SIGNS AND SYMPTOMS TO REPORT TO CLINIC OR PRIMARY MD:   Return in 3 weeks to see doctor -- 09/27/10 @ 2:00 Dr. Mariel Sleet  Today we are doing a CBC, ANA, rheumatoid factor, and ESR (labs)  CT of abd/pelvis without contrast on 09/09/10 at 10:15. Arrive at 10 in the radiology department. If this is normal, we will then proceed to do a bone marrow biopsy.    I acknowledge that I have been informed and understand all the instructions given to me and received a copy. I do not have any more questions at this time, but understand that I may call the [facility]ty [facility] at [facility] at [phone] during business hours should I have any further questions or need assistance in obtaining follow-up care.    __________________________________________  _____________  __________ Signature of Patient or Authorized Representative            Date                   Time    __________________________________________ Nurse's Signature

## 2010-09-03 NOTE — Progress Notes (Signed)
Eileen Courier, MD [address] [place] [address] [place] [address]  1. Pancytopenia  losartan (COZAAR) 100 MG tablet, verapamil (CALAN-SR) 240 MG CR tablet, dexlansoprazole (DEXILANT) 60 MG capsule, Multiple Vitamins-Minerals (MULTIVITAMIN WITH MINERALS) tablet, Sulfabenzamide POWD, Basic metabolic panel, CT Abdomen Pelvis W Contrast, Basic metabolic panel, CT Abdomen Pelvis Wo Contrast, CBC, Sedimentation rate, ANA, Rheumatoid factor, CBC, Sedimentation rate, ANA, Rheumatoid factor  2. Sedimentation rate elevation       INTERVAL HISTORY: Eileen Santana 77 y.o. female returns for  regular  visit for followup of iron deficiency anemia with pancytopenia.  The patient is noted today to be pancytopenic with an elevated sedimentation rate.  The patient denies any complaints.  She denies any headaches, muscle or joint aches, or other pains.    I spent a significant amount of time with the patient going over her lab work.  I went over patient education regarding pancytopenia and what needs to be performed to elucidate its etiology.  She understands that she needs a CT scan of the abdomen and pelvis and if that is normal, we should proceed to a bone marrow aspiration and biopsy.  I spent some time going over patient education regarding splenomegaly and how that can affect the blood counts.  I spent time going over rheumatologic diseases that may be a cause of her increased sedimentation rate and pancytopenia.  I explained the role of bone marrow in producing all three cell lines and a bone marrow aspiration and biopsy is the best procedure to evaluate the functionality of the bone marrow.  I went over the risks, benefits, and alternatives to a bone marrow aspiration and biopsy.  I described the procedure in detail.  I listed the possible complications including infection, bleeding, pain, and ecchymosis.  She knows the procedure.    Past Medical History  Diagnosis Date  . H/O: GI bleed d/t NSAIDS   . Anemia     fe def anemia  . DJD (degenerative joint disease)   . Hypertension   . Colitis, acute hx of   . Ileitis hx of  . Pancytopenia 09/03/2010  . Sedimentation rate elevation 09/03/2010    has Pancytopenia and Sedimentation rate elevation on her problem list.     is allergic to iohexol; naprosyn; red blood cells; vicodin; and sulfa antibiotics.  Ms. Gsell does not currently have medications on file.  Past Surgical History  Procedure Date  . Colonoscopy   . Upper gastrointestinal endoscopy     Denies any headaches, dizziness, double vision, fevers, chills, night sweats, nausea, vomiting, diarrhea, constipation, chest pain, heart palpitations, shortness of breath, blood in stool, black tarry stool, urinary pain, urinary burning, urinary frequency, hematuria.   PHYSICAL EXAMINATION   Filed Vitals:   09/03/10 1309  BP: 153/77  Pulse: 99  Temp: 98 F (36.7 C)    GENERAL:alert, no distress, well nourished, well developed, comfortable and cooperative SKIN: skin color, texture, turgor are normal, no rashes or significant lesions HEAD: Normocephalic EYES: normal EARS: External ears normal OROPHARYNX:mucous membranes are moist  NECK: trachea midline LYMPH:  not examined BREAST:not examined LUNGS: clear to auscultation and percussion HEART: regular rate & rhythm, no murmurs, no gallops, S1 normal and S2 normal ABDOMEN:abdomen soft, non-tender and normal bowel sounds BACK: Back symmetric, no curvature., No CVA tenderness EXTREMITIES:less then 2 second capillary refill, no joint deformities, effusion, or inflammation, no skin discoloration, no clubbing, no cyanosis, positive findings:  edema trace pretibial pitting edema  NEURO: alert &  oriented x 3 with fluent speech, no focal motor/sensory deficits, gait normal    LABORATORY DATA:    Component Value Date/Time   WBC 2.7* 09/02/2010 0949   RBC 3.34* 09/02/2010 0949   HGB 7.9* 09/02/2010 0949   HCT 24.4* 09/02/2010  0949   PLT 129* 09/02/2010 0949   MCV 73.1* 09/02/2010 0949   MCH 23.7* 09/02/2010 0949   MCHC 32.4 09/02/2010 0949   RDW 17.5* 09/02/2010 0949   LYMPHSABS 1.4 09/02/2010 0949   MONOABS 0.5 09/02/2010 0949   EOSABS 0.1 09/02/2010 0949   BASOSABS 0.0 09/02/2010 0949    ASSESSMENT:  1. Pancytopenia 2. Elevated ESR   PLAN:  1. Lab work today: CBC, sed rate, ANA, Rheumatoid factor 2. CT Abd/Pelvis without contrast to evaluate for splenomegaly and lymphadenopathy. 3. Return in 3 weeks for follow-up 4. If CT scan is normal, we will then proceed to perform a bone marrow aspiration and biopsy. 5. I personally reviewed and went over laboratory results with the patient. 6. Performed patient education regarding pancytopenia, potential etiologies, and the investigative process involved in discovering the diagnosis. 7. Patient education regarding the bone marrow aspiration and biopsy procedure and its risks, benefits, and alternatives.  All questions were answered. The patient knows to call the clinic with any problems, questions or concerns. We can certainly see the patient much sooner if necessary.  The patient and plan discussed with Glenford Peers, MD and he is in agreement with the aforementioned.  I spent 40 minutes counseling the patient face to face. The total time spent in the appointment was 60 minutes.  More than 50% of the time spent with the patient was utilized for counseling.    Kinisha Soper

## 2010-09-04 LAB — RHEUMATOID FACTOR: Rhuematoid fact SerPl-aCnc: 225 IU/mL — ABNORMAL HIGH (ref <=14)

## 2010-09-07 ENCOUNTER — Telehealth (HOSPITAL_COMMUNITY): Payer: Self-pay | Admitting: *Deleted

## 2010-09-07 LAB — ANA: Anti Nuclear Antibody(ANA): NEGATIVE

## 2010-09-07 NOTE — Telephone Encounter (Signed)
Will need premeds before feraheme - please advise.

## 2010-09-07 NOTE — Telephone Encounter (Signed)
Check with Pharmacy-do we have standing pre-med orders for feraheme??

## 2010-09-08 NOTE — Telephone Encounter (Signed)
Eileen Santana, Did you check with pharmacy concerning Feraheme pre-meds standing orders?

## 2010-09-09 ENCOUNTER — Other Ambulatory Visit (HOSPITAL_COMMUNITY): Payer: Self-pay | Admitting: Oncology

## 2010-09-09 ENCOUNTER — Encounter (HOSPITAL_COMMUNITY): Payer: Medicare Other | Attending: Oncology

## 2010-09-09 ENCOUNTER — Ambulatory Visit (HOSPITAL_COMMUNITY)
Admission: RE | Admit: 2010-09-09 | Discharge: 2010-09-09 | Disposition: A | Discharge status: Home / Self Care | Payer: Medicare Other | Source: Ambulatory Visit | Attending: Oncology | Admitting: Oncology

## 2010-09-09 VITALS — BP 102/60 | HR 92 | Temp 98.4°F

## 2010-09-09 DIAGNOSIS — R700 Elevated erythrocyte sedimentation rate: Secondary | ICD-10-CM

## 2010-09-09 DIAGNOSIS — D61818 Other pancytopenia: Secondary | ICD-10-CM | POA: Insufficient documentation

## 2010-09-09 DIAGNOSIS — D509 Iron deficiency anemia, unspecified: Secondary | ICD-10-CM | POA: Insufficient documentation

## 2010-09-09 DIAGNOSIS — R599 Enlarged lymph nodes, unspecified: Secondary | ICD-10-CM | POA: Insufficient documentation

## 2010-09-09 MED ORDER — SODIUM CHLORIDE 0.9 % IV SOLN
INTRAVENOUS | Status: DC
  Administered 2010-09-09: 11:00:00 via INTRAVENOUS

## 2010-09-09 MED ORDER — SODIUM CHLORIDE 0.9 % IV SOLN
1020.0000 mg | Freq: Once | INTRAVENOUS | Status: AC
  Administered 2010-09-09: 1020 mg via INTRAVENOUS
  Filled 2010-09-09: qty 34

## 2010-09-10 ENCOUNTER — Telehealth (HOSPITAL_COMMUNITY): Payer: Self-pay | Admitting: Oncology

## 2010-09-10 NOTE — Telephone Encounter (Signed)
Patient's CT did not show splenomegaly.  Will proceed with Bone Marrow BUT it must be after bowling.  Therefore, we will obtain a CBC to make sure she is ok for bowling on 9/20.  Then a bone marrow is scheduled for the beginning of October.  The patient refuses to have anything interfere with her bowling which is the last week of September.

## 2010-09-21 ENCOUNTER — Encounter (HOSPITAL_COMMUNITY): Payer: Medicare Other | Admitting: Oncology

## 2010-09-23 ENCOUNTER — Encounter (HOSPITAL_BASED_OUTPATIENT_CLINIC_OR_DEPARTMENT_OTHER): Payer: Medicare Other

## 2010-09-23 DIAGNOSIS — D61818 Other pancytopenia: Secondary | ICD-10-CM

## 2010-09-23 DIAGNOSIS — R700 Elevated erythrocyte sedimentation rate: Secondary | ICD-10-CM

## 2010-09-23 LAB — COMPREHENSIVE METABOLIC PANEL
ALT: 17 U/L (ref 0–35)
AST: 86 U/L — ABNORMAL HIGH (ref 0–37)
Albumin: 2.7 g/dL — ABNORMAL LOW (ref 3.5–5.2)
CO2: 29 mEq/L (ref 19–32)
Calcium: 10.4 mg/dL (ref 8.4–10.5)
Chloride: 102 mEq/L (ref 96–112)
Creatinine, Ser: 1.06 mg/dL (ref 0.50–1.10)
GFR calc non Af Amer: 50 mL/min — ABNORMAL LOW (ref >60)
Sodium: 137 mEq/L (ref 135–145)

## 2010-09-23 LAB — DIFFERENTIAL
Basophils Absolute: 0 10*3/uL (ref 0.0–0.1)
Basophils Relative: 0 % (ref 0–1)
Lymphocytes Relative: 31 % (ref 12–46)
Monocytes Absolute: 0.5 10*3/uL (ref 0.1–1.0)
Monocytes Relative: 19 % — ABNORMAL HIGH (ref 3–12)
Neutro Abs: 1.4 10*3/uL — ABNORMAL LOW (ref 1.7–7.7)
Neutrophils Relative %: 48 % (ref 43–77)

## 2010-09-23 LAB — CBC
HCT: 29.4 % — ABNORMAL LOW (ref 36.0–46.0)
Hemoglobin: 9.4 g/dL — ABNORMAL LOW (ref 12.0–15.0)
MCHC: 32 g/dL (ref 30.0–36.0)
WBC: 2.8 10*3/uL — ABNORMAL LOW (ref 4.0–10.5)

## 2010-09-23 LAB — RETICULOCYTES
RBC.: 3.84 MIL/uL — ABNORMAL LOW (ref 3.87–5.11)
Retic Count, Absolute: 76.8 10*3/uL (ref 19.0–186.0)

## 2010-09-23 NOTE — Progress Notes (Signed)
Labs drawn today for cbc/diff,ret,cmp,esr

## 2010-09-27 ENCOUNTER — Ambulatory Visit (HOSPITAL_COMMUNITY): Payer: Medicare Other | Admitting: Oncology

## 2010-10-01 ENCOUNTER — Encounter (HOSPITAL_BASED_OUTPATIENT_CLINIC_OR_DEPARTMENT_OTHER): Payer: Medicare Other | Admitting: Oncology

## 2010-10-01 ENCOUNTER — Ambulatory Visit (HOSPITAL_COMMUNITY): Payer: Medicare Other | Admitting: Oncology

## 2010-10-01 DIAGNOSIS — D61818 Other pancytopenia: Secondary | ICD-10-CM

## 2010-10-01 NOTE — Progress Notes (Signed)
Eileen Courier, MD [address] [place] [address] [place] [address]  1. Pancytopenia     INTERVAL HISTORY: Eileen Santana 77 y.o. female returns for  regular  visit for followup of pancytopenia.  The patient is her today to not only go over lab work, but also to discuss a bone marrow aspiration and biopsy.  I personally reviewed and went over laboratory results with the patient.   I went over the the procedure of a bone marrow aspiration and biopsy.  I explained that we prep and cleanse the skin to decrease the risk of infection.  We then numb the skin superficial to the PSIS.  This will likely cause a stinging sensation that is short-lived.  After numbing the skin, a tract all the way to the bone or PSIS is numbed.  After waiting a few moments for the lidocaine to take full effect, a needle is inserted through the numbed tract and through the bone.  Once through the bone, an aspirate is performed.  A "shock" sensation will be felt down the lower extremity of the side of the hip that the procedure is being performed on. This is caused by the negative pressure created by the pulling back of the syringe to obtain blood from the bone marrow.  When this blood is collected and the negative pressure is stopped, the "shock" sensation subsides.  Then a biopsy is performed of the bone and its marrow.  The products from this procedure are then analyzed and the results are reported 1-2 weeks later. Following the procedure, a pressure bandage will be applied to the site and the patient will remain in the procedure room for 30 minutes. The patient will then be discharged.  The risks, benefits, and alternatives of the procedure were explained.  The risks include infection, pain, minimal blood loss, and discomfort following the procedure.  The patient expresses understanding and asks appropriate questions.  All questions regarding the procedure were answered.  The patient reports that she feels well.  She performed  well in her bowling match last week.  She placed 6th.  She is interested in finding an answer for her low blood counts.  Her daughter believes that her pancytopenia is related to a vasculitis that occurred at the onset of her low counts.    I explained that we need to complete our work-up and rule out any myelodysplastic disorders before we investigate other causes of her pancytopenia.  Past Medical History  Diagnosis Date  . H/O: GI bleed d/t NSAIDS  . Anemia     fe def anemia  . DJD (degenerative joint disease)   . Hypertension   . Colitis, acute hx of   . Ileitis hx of  . Pancytopenia 09/03/2010  . Sedimentation rate elevation 09/03/2010    has Pancytopenia and Sedimentation rate elevation on her problem list.     is allergic to iohexol; naprosyn; red blood cells; vicodin; and sulfa antibiotics.  Ms. Kolodny does not currently have medications on file.  Past Surgical History  Procedure Date  . Colonoscopy   . Upper gastrointestinal endoscopy     Denies any headaches, dizziness, double vision, fevers, chills, night sweats, nausea, vomiting, diarrhea, constipation, chest pain, heart palpitations, shortness of breath, blood in stool, black tarry stool, urinary pain, urinary burning, urinary frequency, hematuria.   PHYSICAL EXAMINATION  ECOG PERFORMANCE STATUS: 0 - Asymptomatic  Filed Vitals:   10/01/10 1431  BP: 152/77  Pulse: 90  Temp: 98.8 F (37.1  C)    GENERAL:alert, no distress, well nourished, well developed, comfortable, cooperative and smiling SKIN: skin color, texture, turgor are normal HEAD: Normocephalic EYES: normal EARS: External ears normal OROPHARYNX:mucous membranes are moist  NECK: trachea midline LYMPH:  not examined BREAST:not examined LUNGS: clear to auscultation  HEART: regular rate & rhythm, no murmurs, no gallops, S1 normal and S2 normal ABDOMEN:abdomen soft, non-tender and normal bowel sounds BACK: No CVA tenderness EXTREMITIES:less then 2  second capillary refill, no joint deformities, effusion, or inflammation, no skin discoloration, no clubbing, no cyanosis, positive findings:  edema B/L trace pre-tibial edema.  NEURO: alert & oriented x 3 with fluent speech, no focal motor/sensory deficits, gait normal   LABORATORY DATA: CBC    Component Value Date/Time   WBC 2.8* 09/23/2010 1014   RBC 3.84* 09/23/2010 1014   HGB 9.4* 09/23/2010 1014   HCT 29.4* 09/23/2010 1014   PLT 82* 09/23/2010 1014   MCV 76.6* 09/23/2010 1014   MCH 24.5* 09/23/2010 1014   MCHC 32.0 09/23/2010 1014   RDW 21.3* 09/23/2010 1014   LYMPHSABS 0.9 09/23/2010 1014   MONOABS 0.5 09/23/2010 1014   EOSABS 0.0 09/23/2010 1014   BASOSABS 0.0 09/23/2010 1014      Chemistry      Component Value Date/Time   NA 137 09/23/2010 1014   K 3.9 09/23/2010 1014   CL 102 09/23/2010 1014   CO2 29 09/23/2010 1014   BUN 11 09/23/2010 1014   CREATININE 1.06 09/23/2010 1014      Component Value Date/Time   CALCIUM 10.4 09/23/2010 1014   ALKPHOS 162* 09/23/2010 1014   AST 86* 09/23/2010 1014   ALT 17 09/23/2010 1014   BILITOT 0.6 09/23/2010 1014       RADIOGRAPHIC STUDIES:  09/09/10  *RADIOLOGY REPORT*  Clinical Data: Evaluate pancytopenia  CT ABDOMEN AND PELVIS WITHOUT CONTRAST  Technique: Multidetector CT imaging of the abdomen and pelvis was  performed following the standard protocol without intravenous  contrast.  Comparison: None  Findings:  Lung bases appear clear.  There is no focal liver abnormality identified.  The spleen measures 0.9 cm in craniocaudal dimension. Within  normal limits.  Calcified porta hepatic lymph nodes and gastrohepatic ligament  lymph nodes are identified. The adrenal glands both appear within  normal limits.  Gallbladder is negative. No biliary dilatation. Normal appearance  of the right kidney. There is a cyst within the interpolar region  of the left kidney. Nonobstructing calculus is noted within the  inferior pole of the left kidney  measuring 4 mm, image 27.  Multiple calcified upper abdominal lymph nodes are identified.  No pelvic  Or inguinal adenopathy identified.  A trace amount of free fluid is noted within the dependent portion  of the pelvis.  The uterus and the adnexal structures are unremarkable. The  urinary bladder appears collapsed. Uterus and the adnexal  structures are unremarkable. There is a trace amount of free fluid  identified within the dependent portion of the pelvis.  The stomach and small bowel loops appear normal. The appendix is  normal.  Normal appearance of the colon.  IMPRESSION:  1. No evidence for splenomegaly.  2. Calcified upper abdominal lymph nodes are identified which are  likely the sequela of prior granulomatous inflammation or  infection. No noncalcified pathologic adenopathy identified.  3. Small amount of free fluid within the pelvis.  Original Report Authenticated By: Rosealee Albee, M.D.     ASSESSMENT:  1. Pancytopenia 2. Intermittent elevated  ESR   PLAN:  1. Bone marrow aspiration and biopsy on Monday October 1st at 9 am 2. Recommend the patient take a Percocet one hour prior to procedure.  She already has this medication at home. 3. Return 2 weeks following bone marrow to go over results. 4. I personally reviewed and went over laboratory results with the patient. 5. More than 50% of the time spent with the patient was utilized for counseling.    All questions were answered. The patient knows to call the clinic with any problems, questions or concerns. We can certainly see the patient much sooner if necessary.  The patient and plan discussed with Glenford Peers, MD and he is in agreement with the aforementioned.  KEFALAS,THOMAS

## 2010-10-01 NOTE — Patient Instructions (Signed)
[facility]  Discharge Instructions  RECOMMENDATIONS MADE BY THE CONSULTANT AND ANY TEST RESULTS WILL BE SENT TO YOUR REFERRING DOCTOR.   SPECIAL INSTRUCTIONS/FOLLOW-UP: Return to Clinic on Monday for Bone Marrow.  See the from desk for appointments.   I acknowledge that I have been informed and understand all the instructions given to me and received a copy. I do not have any more questions at this time, but understand that I may call the Specialty Clinic at [facility] at [phone] during business hours should I have any further questions or need assistance in obtaining follow-up care.    __________________________________________  _____________  __________ Signature of Patient or Authorized Representative            Date                   Time    __________________________________________ Nurse's Signature

## 2010-10-04 ENCOUNTER — Other Ambulatory Visit (HOSPITAL_COMMUNITY): Payer: Self-pay | Admitting: Oncology

## 2010-10-04 ENCOUNTER — Encounter (HOSPITAL_COMMUNITY): Payer: Medicare Other | Attending: Oncology | Admitting: Oncology

## 2010-10-04 DIAGNOSIS — D61818 Other pancytopenia: Secondary | ICD-10-CM | POA: Insufficient documentation

## 2010-10-04 DIAGNOSIS — R700 Elevated erythrocyte sedimentation rate: Secondary | ICD-10-CM | POA: Insufficient documentation

## 2010-10-04 LAB — DIFFERENTIAL
Basophils Absolute: 0 10*3/uL (ref 0.0–0.1)
Basophils Relative: 1 % (ref 0–1)
Eosinophils Absolute: 0 10*3/uL (ref 0.0–0.7)
Eosinophils Relative: 1 % (ref 0–5)
Monocytes Absolute: 0.4 10*3/uL (ref 0.1–1.0)

## 2010-10-04 LAB — CBC
HCT: 28.4 % — ABNORMAL LOW (ref 36.0–46.0)
MCH: 25.5 pg — ABNORMAL LOW (ref 26.0–34.0)
MCV: 76.1 fL — ABNORMAL LOW (ref 78.0–100.0)
RBC: 3.73 MIL/uL — ABNORMAL LOW (ref 3.87–5.11)
WBC: 2.1 10*3/uL — ABNORMAL LOW (ref 4.0–10.5)

## 2010-10-04 NOTE — Patient Instructions (Addendum)
Bone Marrow Aspiration, Bone Marrow Biopsy Care After Read the instructions outlined below and refer to this sheet in the next few weeks. These discharge instructions provide you with general information on caring for yourself after you leave the hospital. Your caregiver may also give you specific instructions. While your treatment has been planned according to the most current medical practices available, unavoidable complications occasionally occur. If you have any problems or questions after discharge, call your caregiver. HOME CARE INSTRUCTIONS You have had sedation and may be sleepy or dizzy. Your thinking may not be as clear as usual. For the next 24 hours:  Only take over-the-counter or prescription medicines for pain, discomfort, and or fever as directed by your caregiver.   Do not drink alcohol.   Do not smoke.   Do not drive.   Do not make important legal decisions.   Do not operate heavy machinery.   Do not care for small children by yourself.   Keep your dressing clean and dry. You may replace dressing with a bandage after 24 hours.   You may take a bath or shower after 24 hours.   Use an ice pack for 20 minutes every 2 hours while awake for pain as needed.  FINDING OUT THE RESULTS OF YOUR TEST Not all test results are available during your visit. If your test results are not back during the visit, make an appointment with your caregiver to find out the results. Do not assume everything is normal if you have not heard from your caregiver or the medical facility. It is important for you to follow up on all of your test results.  SEEK MEDICAL CARE IF:  There is redness, swelling, or increasing pain at the biopsy site.   There is pus coming from the biopsy site.   There is drainage from a biopsy site lasting longer than one day.   An unexplained oral temperature above 100.5 develops.  SEEK IMMEDIATE MEDICAL CARE IF:  You develop a rash.   You have difficulty  breathing.   You develop any reaction or side effects to medications given.  Document Released: 07/09/2004 Document Re-Released: 06/09/2009 [facility] Patient Information 2011 [place], [place].[facility]  Discharge Instructions  RECOMMENDATIONS MADE BY THE CONSULTANT AND ANY TEST RESULTS WILL BE SENT TO YOUR REFERRING DOCTOR.   EXAM FINDINGS BY MD TODAY AND SIGNS AND SYMPTOMS TO REPORT TO CLINIC OR PRIMARY MD:  Bone marrow biopsy to left hip  INSTRUCTIONS GIVEN AND DISCUSSED: Other  Keep dressing dry and intact for 24 hrs.  If bleeding occurs reinforce dressing, if bleeding then continues report to ED.  SPECIAL INSTRUCTIONS/FOLLOW-UP: Return to Clinic on 10/13/10   I acknowledge that I have been informed and understand all the instructions given to me and received a copy. I do not have any more questions at this time, but understand that I may call the Specialty Clinic at [facility] at [phone] during business hours should I have any further questions or need assistance in obtaining follow-up care.    __________________________________________  _____________  __________ Signature of Patient or Authorized Representative            Date                   Time    __________________________________________ Nurse's Signature

## 2010-10-04 NOTE — Progress Notes (Signed)
Procedure began at 0930 and finished at 0940 am. Pressure dressing applied to left hip and pt placed supine. Vitals stable. Venipuncture x 1 to lt antecubital for cbc diff and pt tolerated procedures well. Denies pain or discomfort. Pt. Instructed to leave pressure dressing on for 24 hrs and to report to [place] if bleeding through dressing occurs.  D/c ed home with daughter.

## 2010-10-04 NOTE — Progress Notes (Signed)
This office note has been dictated.

## 2010-10-04 NOTE — Procedures (Signed)
DIAGNOSES: 1. Pancytopenia, unclear etiology. 2. Elevated sedimentation rate, unclear etiology. 3. History of colitis in the past. 4. History of hypertension. 5. History of gastrointestinal bleed due to nonsteroidal     antiinflammatory drugs in the past.  Phoebie agreed to do a bone marrow aspirate and biopsy after her blood counts continued to show pancytopenia.  Her white count the other day was 2800, hemoglobin 9.4 g, platelets 82,000 and we do not have a clear etiology for this at all.  She has no evidence of splenomegaly.  No evidence for adenopathy.  No evidence for B12 or folic acid deficiency, etc.  So we have proceeded to talk to her about a bone marrow aspirate and biopsy which she agreed to do.  After informed consent she was placed in the prone position.  She was of course once again identified to [place] as [employer].  She had the right and left superior posterior iliac spinous processes identified and I chose the right to perform the bone marrow biopsy on.  She was prepped with Betadine swabs x3.  Sterile cloths were then placed over the area.  She was anesthetized with 9 mL of 2% plain Xylocaine and a bone marrow aspirate and biopsy for flow cytometry and cytogenics was performed without incident.    ______________________________ Ladona Horns. Mariel Sleet, MD ESN/MEDQ  D:  10/04/2010  T:  10/04/2010  Job:  [id number]

## 2010-10-04 NOTE — Progress Notes (Signed)
Pt. Arrived for Bone Marrow biopsy this am. Verbalized understanding of procedure. Informed consent was obtained and potential risks including bleeding, infection and pain were reviewed with the patient.   Posterior iliac crest(s) prepped with Betadine.   Lidocaine 2% esp 02/2013 10 cc local anesthesia infiltrated into the subcutaneous tissue.  Left bone marrow biopsy and left bone marrow aspirate was obtained.   The procedure was tolerated well and there were no complications.  Specimens sent for: flow cytometry and Cytogenetics Nurse: Edythe Lynn A

## 2010-10-05 ENCOUNTER — Other Ambulatory Visit (HOSPITAL_COMMUNITY): Payer: Self-pay | Admitting: Oncology

## 2010-10-18 ENCOUNTER — Encounter (HOSPITAL_BASED_OUTPATIENT_CLINIC_OR_DEPARTMENT_OTHER): Payer: Medicare Other | Admitting: Oncology

## 2010-10-18 DIAGNOSIS — D61818 Other pancytopenia: Secondary | ICD-10-CM

## 2010-10-18 DIAGNOSIS — R700 Elevated erythrocyte sedimentation rate: Secondary | ICD-10-CM

## 2010-10-18 NOTE — Patient Instructions (Signed)
[facility]  Discharge Instructions  RECOMMENDATIONS MADE BY THE CONSULTANT AND ANY TEST RESULTS WILL BE SENT TO YOUR REFERRING DOCTOR.   EXAM FINDINGS BY MD TODAY AND SIGNS AND SYMPTOMS TO REPORT TO CLINIC OR PRIMARY MD:   Bone marrow was good.  Please return in 1 month for labs (CBC, ESR, C-reactive protein)   November 12th @ 9:10  Return to see Big Daddy in 3 months:  January 14th @ 10:00  We are referring you to Zenovia Jordan, rheumatologist     I acknowledge that I have been informed and understand all the instructions given to me and received a copy. I do not have any more questions at this time, but understand that I may call the Specialty Clinic at [facility] at [phone] during business hours should I have any further questions or need assistance in obtaining follow-up care.    __________________________________________  _____________  __________ Signature of Patient or Authorized Representative            Date                   Time    __________________________________________ Nurse's Signature

## 2010-10-18 NOTE — Progress Notes (Signed)
[name], MD [address] [place] [address] [place] [address]  1. Pancytopenia  CBC, Sedimentation rate, C-reactive protein  2. Sedimentation rate elevation  Sedimentation rate    INTERVAL HISTORY: Eileen Santana 77 y.o. female returns for  regular  visit for followup of pancytopenia.  The patient recently underwent a bone marrow aspiration and biopsy.  The results reveal a slightly hypercellular bone marrow for age with trilineage hematopoiesis and 5% plasma cells.  No specific changes.  Significant dyspoiesis is not appreciated.  The patient and her daughter, of course, are pleased with the results.  I personally reviewed and went over laboratory results with the patient.  The patient however remains asymptomatic of her pancytopenia and she remains pancytopenic.  I have told the patient that we have completed our work-up and she does not have any myelodysplastic syndrome that we can detect via bone marrow aspiration and biopsy.  We spent some time going over what the next steps are.  I have suggested that we get a rheumatology consultation with Zenovia Jordan.  She has a history of increased sedimentation rates.  She may have an inflammatory process going on that needs further evaluation.    Past Medical History  Diagnosis Date  . H/O: GI bleed d/t NSAIDS  . Anemia     fe def anemia  . DJD (degenerative joint disease)   . Hypertension   . Colitis, acute hx of   . Ileitis hx of  . Pancytopenia 09/03/2010  . Sedimentation rate elevation 09/03/2010    has Pancytopenia and Sedimentation rate elevation on her problem list.     is allergic to iohexol; naprosyn; red blood cells; vicodin; and sulfa antibiotics.  Ms. Dona does not currently have medications on file.  Past Surgical History  Procedure Date  . Colonoscopy   . Upper gastrointestinal endoscopy     Denies any headaches, dizziness, double vision, fevers, chills, night sweats, nausea, vomiting, diarrhea,  constipation, chest pain, heart palpitations, shortness of breath, blood in stool, black tarry stool, urinary pain, urinary burning, urinary frequency, hematuria.   PHYSICAL EXAMINATION  ECOG PERFORMANCE STATUS: 1 - Symptomatic but completely ambulatory  Filed Vitals:   10/18/10 1525  BP: 147/72  Pulse: 106  Temp: 97.3 F (36.3 C)    GENERAL:alert, no distress, well nourished, well developed, comfortable, cooperative and smiling SKIN: skin color, texture, turgor are normal HEAD: Normocephalic EYES: normal EARS: External ears normal OROPHARYNX:mucous membranes are moist  NECK: trachea midline LYMPH:  not examined BREAST:not examined LUNGS: not examined HEART: not examined ABDOMEN:not examined BACK: not examined EXTREMITIES:no edema  NEURO: alert & oriented x 3 with fluent speech, gait normal   LABORATORY DATA: CBC    Component Value Date/Time   WBC 2.1* 10/04/2010 0946   RBC 3.73* 10/04/2010 0946   HGB 9.5* 10/04/2010 0946   HCT 28.4* 10/04/2010 0946   PLT 86* 10/04/2010 0946   MCV 76.1* 10/04/2010 0946   MCH 25.5* 10/04/2010 0946   MCHC 33.5 10/04/2010 0946   RDW 20.2* 10/04/2010 0946   LYMPHSABS 0.8 10/04/2010 0946   MONOABS 0.4 10/04/2010 0946   EOSABS 0.0 10/04/2010 0946   BASOSABS 0.0 10/04/2010 0946    PATHOLOGY: 1. Bone Marrow, Aspirate, Biopsy, and clot, right SISP- slightly hypercellular bone marrow for age with trilineage hematopoiesis and 5% plasma cells.  No specific changes.  Significant dyspoiesis is not appreciated.  ASSESSMENT:  1. Pancytopenia 2. Elevated ESR   PLAN:  1. Referral to Dr. Marylene Land  Hawkes for evaluation of elevated ESR and associated pancytopenia with a negative bone marrow aspiration and biopsy. 2. Lab work in 4 weeks: CBC diff, ESR, CRP 3. Return to the clinic in 3 months for follow-up. 4. I personally reviewed and went over laboratory and pathology results with the patient.    All questions were answered. The patient knows to call  the clinic with any problems, questions or concerns. We can certainly see the patient much sooner if necessary.  KEFALAS,THOMAS

## 2010-11-15 ENCOUNTER — Encounter (HOSPITAL_COMMUNITY): Payer: Medicare Other | Attending: Oncology

## 2010-11-15 DIAGNOSIS — R700 Elevated erythrocyte sedimentation rate: Secondary | ICD-10-CM

## 2010-11-15 DIAGNOSIS — D61818 Other pancytopenia: Secondary | ICD-10-CM

## 2010-11-15 LAB — CBC
MCHC: 33.5 g/dL (ref 30.0–36.0)
Platelets: 121 10*3/uL — ABNORMAL LOW (ref 150–400)
RDW: 17.2 % — ABNORMAL HIGH (ref 11.5–15.5)
WBC: 4.1 10*3/uL (ref 4.0–10.5)

## 2010-11-15 LAB — C-REACTIVE PROTEIN: CRP: 0.18 mg/dL — ABNORMAL LOW (ref <0.60)

## 2010-11-15 LAB — SEDIMENTATION RATE: Sed Rate: 132 mm/hr — ABNORMAL HIGH (ref 0–22)

## 2010-11-15 NOTE — Progress Notes (Signed)
Labs drawn today for cbc,esr,c-reactive protein

## 2011-01-17 ENCOUNTER — Encounter (HOSPITAL_COMMUNITY): Payer: Medicare Other | Attending: Oncology | Admitting: Oncology

## 2011-01-17 ENCOUNTER — Encounter (HOSPITAL_COMMUNITY): Payer: Self-pay | Admitting: Oncology

## 2011-01-17 DIAGNOSIS — D61818 Other pancytopenia: Secondary | ICD-10-CM | POA: Insufficient documentation

## 2011-01-17 DIAGNOSIS — E611 Iron deficiency: Secondary | ICD-10-CM

## 2011-01-17 DIAGNOSIS — R635 Abnormal weight gain: Secondary | ICD-10-CM

## 2011-01-17 DIAGNOSIS — R700 Elevated erythrocyte sedimentation rate: Secondary | ICD-10-CM | POA: Insufficient documentation

## 2011-01-17 DIAGNOSIS — D509 Iron deficiency anemia, unspecified: Secondary | ICD-10-CM

## 2011-01-17 DIAGNOSIS — L089 Local infection of the skin and subcutaneous tissue, unspecified: Secondary | ICD-10-CM

## 2011-01-17 LAB — CBC
HCT: 29.7 % — ABNORMAL LOW (ref 36.0–46.0)
Hemoglobin: 10.1 g/dL — ABNORMAL LOW (ref 12.0–15.0)
MCH: 23.3 pg — ABNORMAL LOW (ref 26.0–34.0)
RBC: 4.33 MIL/uL (ref 3.87–5.11)

## 2011-01-17 LAB — RETICULOCYTES: Retic Ct Pct: 2.1 % (ref 0.4–3.1)

## 2011-01-17 LAB — COMPREHENSIVE METABOLIC PANEL
ALT: 72 U/L — ABNORMAL HIGH (ref 0–35)
Alkaline Phosphatase: 112 U/L (ref 39–117)
BUN: 31 mg/dL — ABNORMAL HIGH (ref 6–23)
CO2: 23 mEq/L (ref 19–32)
Calcium: 10.5 mg/dL (ref 8.4–10.5)
GFR calc Af Amer: 46 mL/min — ABNORMAL LOW (ref >90)
GFR calc non Af Amer: 39 mL/min — ABNORMAL LOW (ref >90)
Glucose, Bld: 167 mg/dL — ABNORMAL HIGH (ref 70–99)
Potassium: 3.9 mEq/L (ref 3.5–5.1)
Sodium: 137 mEq/L (ref 135–145)

## 2011-01-17 LAB — DIFFERENTIAL
Basophils Absolute: 0 10*3/uL (ref 0.0–0.1)
Eosinophils Absolute: 0.1 10*3/uL (ref 0.0–0.7)
Eosinophils Relative: 1 % (ref 0–5)
Neutrophils Relative %: 64 % (ref 43–77)
Smear Review: DECREASED

## 2011-01-17 LAB — SEDIMENTATION RATE: Sed Rate: 34 mm/hr — ABNORMAL HIGH (ref 0–22)

## 2011-01-17 NOTE — Progress Notes (Signed)
CC:   Zenovia Jordan, MD [name]. Loleta Chance, MD Lionel December, M.D.  DIAGNOSES: 1. Inflammatory disorder, unclear as to its exact nature.  Presently,     she is on prednisone 20 mg a day since December.  She states that     Dr. Nickola Major started in December, but she cannot remember the exact     date. 2. Iron-deficiency at presentation also with thrombocytopenia, status     post intravenous iron.  We will see what her labs are today. 3. Degenerative joint disease. 4. History of hypertension. 5. Chronic leg swelling, which has gotten worse.  Plus, she has gone     up in weight from 143 pounds in July to 264 pounds today.  She was     136 pounds actually in October.  INTERVAL HISTORY:  She tells [place] that she was placed on prednisone by Dr. Nickola Major and at one point, in the last 6 months, she was placed on Megace by Dr. Loleta Chance.  She took it for awhile, got a better appetite, and her legs started swelling at some point in the last couple months.  They are 3+ pitting edema presently bilaterally below the knees and very, very puffy.  She states her waist is enlarged as well, but she is not short of breath and she just "does not feel great."  She cannot put her finger on anything specific.  She needs blood work today which will include her sed rate, CBC, diff, reticulocyte count, and CMET.  I will check some protein electrophoresis and quantitative IgG levels to make sure we are not missing anything with the sed rate, but she also had a bone marrow biopsy and aspiration with flow cytometry in October which did not reveal a plasma cell dyscrasia.  It did show adequate iron stores at that time.  I will check her ferritin level since that was low in the past at 9.  She states also that she is on a diuretic, but we do not have one listed and she cannot remember the name of it.  She states that she takes it when she takes her potassium.  She will call us back with those doses, names, etc.  I am hoping we  can make her sense of well being better.  I tried to explain to her in detail about her inflammatory disorder which is not well-characterized otherwise.  I think she understands, but I am not sure we can put a name to her disorder just yet.  At one time, she was said, by Dr. Karilyn Cota, to have ileitis and colitis with ulceration, but it was probably due to nonsteroidal anti- inflammatory drug usage and nothing more.  We will check a few blood tests today.  We will forward them to Dr. Nickola Major, who is going to see her on Thursday.  We will also send my note to Dr. Loleta Chance.  I will see her myself in 3 months.   ______________________________ Ladona Horns. Mariel Sleet, MD ESN/MEDQ  D:  01/17/2011  T:  01/17/2011  Job:  [id number]

## 2011-01-17 NOTE — Patient Instructions (Addendum)
Eileen Santana  [id number] 1930/04/04   [facility]  Discharge Instructions  RECOMMENDATIONS MADE BY THE CONSULTANT AND ANY TEST RESULTS WILL BE SENT TO YOUR REFERRING DOCTOR.   EXAM FINDINGS BY MD TODAY AND SIGNS AND SYMPTOMS TO REPORT TO CLINIC OR PRIMARY MD: Call Tobie Lords [phone]) with the name and dosage of your fluid pill and your potassium.  Will check some labs today and will fax them to Dr. Nickola Major.  You have a lot of fluid in your legs that we need to try to get rid of.  MEDICATIONS PRESCRIBED: none   INSTRUCTIONS GIVEN AND DISCUSSED: Report increased shortness of breath.  SPECIAL INSTRUCTIONS/FOLLOW-UP: Return to Clinic in 2 months.   I acknowledge that I have been informed and understand all the instructions given to me and received a copy. I do not have any more questions at this time, but understand that I may call the Specialty Clinic at [facility] at [phone] during business hours should I have any further questions or need assistance in obtaining follow-up care.    __________________________________________  _____________  __________ Signature of Patient or Authorized Representative            Date                   Time    __________________________________________ Nurse's Signature

## 2011-01-17 NOTE — Progress Notes (Signed)
This office note has been dictated.

## 2011-01-17 NOTE — Progress Notes (Signed)
Patient is taking Torsemide 10 mg as needed for swelling and Potassium Chloride 10 mEq whenever she takes the Torsemide.  Discussed with Dr. Mariel Sleet and prescription for Spironolactone 50 mg Bid and refill for Torsemide also called in.  Patient notified and verbalized understanding.

## 2011-01-19 LAB — PROTEIN ELECTROPHORESIS, SERUM
Alpha-1-Globulin: 4.5 % (ref 2.9–4.9)
Alpha-2-Globulin: 9.9 % (ref 7.1–11.8)
Beta 2: 7.8 % — ABNORMAL HIGH (ref 3.2–6.5)
Beta Globulin: 7.6 % — ABNORMAL HIGH (ref 4.7–7.2)
Gamma Globulin: 20.3 % — ABNORMAL HIGH (ref 11.1–18.8)

## 2011-01-21 ENCOUNTER — Telehealth (HOSPITAL_COMMUNITY): Payer: Self-pay | Admitting: Oncology

## 2011-02-01 ENCOUNTER — Telehealth (HOSPITAL_COMMUNITY): Payer: Self-pay

## 2011-02-01 ENCOUNTER — Encounter: Payer: Self-pay | Admitting: Oncology

## 2011-02-01 NOTE — Telephone Encounter (Signed)
Spoke with [name], information given that elevated LFTs probably due to the inflammatory process.  Per daughter, Dr. Lendon Colonel is referring Mrs. Sison to Dr. Karilyn Cota does not know when the appointment is going to be, she is suppose to be contacted with day and time.

## 2011-02-02 ENCOUNTER — Encounter (INDEPENDENT_AMBULATORY_CARE_PROVIDER_SITE_OTHER): Payer: Self-pay | Admitting: *Deleted

## 2011-02-07 ENCOUNTER — Ambulatory Visit (HOSPITAL_COMMUNITY): Payer: Medicare Other | Admitting: Oncology

## 2011-02-17 ENCOUNTER — Ambulatory Visit (INDEPENDENT_AMBULATORY_CARE_PROVIDER_SITE_OTHER): Payer: Medicare Other | Admitting: Internal Medicine

## 2011-02-17 ENCOUNTER — Encounter (INDEPENDENT_AMBULATORY_CARE_PROVIDER_SITE_OTHER): Payer: Self-pay | Admitting: Internal Medicine

## 2011-02-17 DIAGNOSIS — K922 Gastrointestinal hemorrhage, unspecified: Secondary | ICD-10-CM | POA: Insufficient documentation

## 2011-02-17 DIAGNOSIS — D649 Anemia, unspecified: Secondary | ICD-10-CM

## 2011-02-17 DIAGNOSIS — D509 Iron deficiency anemia, unspecified: Secondary | ICD-10-CM | POA: Insufficient documentation

## 2011-02-17 DIAGNOSIS — R748 Abnormal levels of other serum enzymes: Secondary | ICD-10-CM | POA: Insufficient documentation

## 2011-02-17 DIAGNOSIS — D696 Thrombocytopenia, unspecified: Secondary | ICD-10-CM

## 2011-02-17 NOTE — Patient Instructions (Addendum)
Continue present medications. Will get a repeat CMet and a ferritin on her today. Further recommendations once we have these back. I will discuss with DR. Rehman.

## 2011-02-17 NOTE — Progress Notes (Signed)
Subjective:     Patient ID: Eileen Santana, female   DOB: July 17, 1930, 78 y.o.   MRN: [id number]  HPIWilma is a 78 yr old female referred to our off by Dr. Loleta Chance for elevated liver enzymes.( See below) Her appetite is good. No weight loss. No abdominal pain. There is no hx of etoh abuse. No new medications.She usually has a BM x 1 a day.   She has a hx of chronic mild pancytopenia and is being managed by Dr. Mariel Sleet. . 09/09/2010 CT abdomen and pelvis with CM. There e is no focal liver abnormality identified. No evidence of splenomegaly. Calcified upper abdominal lymph nodes are identified which are likely the sequela of prior granulomatous inflammation or infection. Small amt of free fluid within the pelvis.  01/17/2011 AST 133, ALT 72, ALP 112. 09/23/10 AST 86, ALT 17, ALP 162. 04/22/10 AST 67, ALT 16, ALP 91  Platelets low at 97. Review of Systems Current Outpatient Prescriptions  Medication Sig Dispense Refill  . dexlansoprazole (DEXILANT) 60 MG capsule Take 60 mg by mouth daily.        Marland Kitchen losartan (COZAAR) 100 MG tablet Take 100 mg by mouth daily.        . Multiple Vitamins-Minerals (MULTIVITAMIN WITH MINERALS) tablet Take 1 tablet by mouth daily.        . predniSONE (DELTASONE) 5 MG tablet Take 20 mg by mouth daily.      Marland Kitchen spironolactone (ALDACTONE) 50 MG tablet Take 50 mg by mouth 2 (two) times daily. To start 01/17/11      . torsemide (DEMADEX) 10 MG tablet Take 10 mg by mouth as needed.      . verapamil (CALAN-SR) 240 MG CR tablet Take 240 mg by mouth daily.        Marland Kitchen POTASSIUM CHLORIDE PO Take 10 mEq by mouth. Takes when she takes Torsemide      . Sulfabenzamide POWD by Does not apply route.         Past Medical History  Diagnosis Date  . H/O: GI bleed d/t NSAIDS  . Anemia     fe def anemia  . DJD (degenerative joint disease)   . Hypertension   . Colitis, acute hx of   . Ileitis hx of  . Pancytopenia 09/03/2010  . Sedimentation rate elevation 09/03/2010   Past Surgical History   Procedure Date  . Colonoscopy   . Upper gastrointestinal endoscopy    History   Social History  . Marital Status: Single    Spouse Name: N/A    Number of Children: N/A  . Years of Education: N/A   Occupational History  . Not on file.   Social History Main Topics  . Smoking status: Current Some Day Smoker    Types: Cigarettes  . Smokeless tobacco: Not on file  . Alcohol Use: No  . Drug Use: No  . Sexually Active: Not on file   Other Topics Concern  . Not on file   Social History Narrative  . No narrative on file   Family Status  Relation Status Death Age  . Mother Deceased     CVA  . Father Deceased     MI   Allergies  Allergen Reactions  . Iohexol Swelling    IV Dye   . Naprosyn (Naproxen)     Drowsy, hallucinations  . Red Blood Cells Swelling and Dermatitis    With blood transfusion 2012  . Vicodin (Hydrocodone-Acetaminophen) Other (See Comments)  hallucinations  . Sulfa Antibiotics Rash      Objective:   Physical Exam Filed Vitals:   02/17/11 1104  Height: 5\' 6"  (1.676 m)  Weight: 156 lb (70.761 kg)   Alert and oriented. Skin warm and dry. Oral mucosa is moist.   . Sclera anicteric, conjunctivae is pink. Thyroid not enlarged. No cervical lymphadenopathy. Lungs clear. Heart regular rate and rhythm.  Abdomen is soft. Bowel sounds are positive. No hepatomegaly. No abdominal masses felt. No tenderness.  No edema to lower extremities. Patient is alert and oriented.      Assessment:    Elevated liver enzymes. ? Etiology. No fatty liver on CT. No spenomegaly. No new medications.    Plan:    Will repeat a CMet and US liver. Further recommendations once we have these studies back. I will discuss with DR. Rehman.

## 2011-02-18 LAB — COMPREHENSIVE METABOLIC PANEL
ALT: 73 U/L — ABNORMAL HIGH (ref 0–35)
CO2: 21 mEq/L (ref 19–32)
Chloride: 104 mEq/L (ref 96–112)
Potassium: 4.5 mEq/L (ref 3.5–5.3)
Sodium: 136 mEq/L (ref 135–145)
Total Bilirubin: 1.3 mg/dL — ABNORMAL HIGH (ref 0.3–1.2)
Total Protein: 6.3 g/dL (ref 6.0–8.3)

## 2011-02-18 LAB — FERRITIN: Ferritin: 48 ng/mL (ref 10–291)

## 2011-02-21 ENCOUNTER — Telehealth (INDEPENDENT_AMBULATORY_CARE_PROVIDER_SITE_OTHER): Payer: Self-pay | Admitting: Internal Medicine

## 2011-02-21 DIAGNOSIS — N289 Disorder of kidney and ureter, unspecified: Secondary | ICD-10-CM

## 2011-02-21 DIAGNOSIS — Z1159 Encounter for screening for other viral diseases: Secondary | ICD-10-CM

## 2011-02-21 DIAGNOSIS — R748 Abnormal levels of other serum enzymes: Secondary | ICD-10-CM

## 2011-02-21 DIAGNOSIS — R932 Abnormal findings on diagnostic imaging of liver and biliary tract: Secondary | ICD-10-CM

## 2011-02-21 NOTE — Telephone Encounter (Signed)
Left message with Daughter

## 2011-02-21 NOTE — Telephone Encounter (Signed)
Spoke with patient. Will get a ACE level, Hep B, Hep C, SMA. After results she will probably need a liver biopsy. I discussed this case with Dr. Karilyn Cota.

## 2011-02-22 ENCOUNTER — Ambulatory Visit (HOSPITAL_COMMUNITY)
Admission: RE | Admit: 2011-02-22 | Discharge: 2011-02-22 | Disposition: A | Discharge status: Home / Self Care | Payer: Medicare Other | Source: Ambulatory Visit | Attending: Internal Medicine | Admitting: Internal Medicine

## 2011-02-22 DIAGNOSIS — R748 Abnormal levels of other serum enzymes: Secondary | ICD-10-CM | POA: Insufficient documentation

## 2011-02-22 DIAGNOSIS — K7689 Other specified diseases of liver: Secondary | ICD-10-CM | POA: Insufficient documentation

## 2011-02-22 DIAGNOSIS — D696 Thrombocytopenia, unspecified: Secondary | ICD-10-CM | POA: Insufficient documentation

## 2011-02-22 LAB — HEPATITIS B SURFACE ANTIGEN: Hepatitis B Surface Ag: NEGATIVE

## 2011-02-22 LAB — HEPATITIS C ANTIBODY: HCV Ab: NEGATIVE

## 2011-02-23 ENCOUNTER — Telehealth (INDEPENDENT_AMBULATORY_CARE_PROVIDER_SITE_OTHER): Payer: Self-pay | Admitting: *Deleted

## 2011-02-23 LAB — ANGIOTENSIN CONVERTING ENZYME: Angiotensin-Converting Enzyme: 57 U/L — ABNORMAL HIGH (ref 8–52)

## 2011-02-23 LAB — ANTI-SMOOTH MUSCLE ANTIBODY, IGG: Smooth Muscle Ab: 48 U — ABNORMAL HIGH (ref <20)

## 2011-02-23 NOTE — Telephone Encounter (Signed)
Per Terri setzer,NP the patient will need repeat C-MET in 1 week. Lab noted and printed to [employer]

## 2011-03-01 ENCOUNTER — Telehealth (INDEPENDENT_AMBULATORY_CARE_PROVIDER_SITE_OTHER): Payer: Self-pay | Admitting: Internal Medicine

## 2011-03-01 NOTE — Telephone Encounter (Signed)
Will drop Prednisone down to 35mg  x 1 week, then 5mg  every week. The lab should be back by Monday.

## 2011-03-04 ENCOUNTER — Other Ambulatory Visit (INDEPENDENT_AMBULATORY_CARE_PROVIDER_SITE_OTHER): Payer: Self-pay | Admitting: Internal Medicine

## 2011-03-04 DIAGNOSIS — I441 Atrioventricular block, second degree: Secondary | ICD-10-CM

## 2011-03-04 HISTORY — DX: Atrioventricular block, second degree: I44.1

## 2011-03-05 ENCOUNTER — Other Ambulatory Visit: Payer: Self-pay

## 2011-03-05 ENCOUNTER — Encounter (HOSPITAL_COMMUNITY): Payer: Self-pay | Admitting: Emergency Medicine

## 2011-03-05 ENCOUNTER — Inpatient Hospital Stay (HOSPITAL_COMMUNITY)
Admission: EM | Admit: 2011-03-05 | Discharge: 2011-03-06 | DRG: 312 | Disposition: A | Discharge status: Home / Self Care | Payer: Medicare Other | Attending: Internal Medicine | Admitting: Internal Medicine

## 2011-03-05 ENCOUNTER — Emergency Department (HOSPITAL_COMMUNITY): Payer: Medicare Other

## 2011-03-05 DIAGNOSIS — N289 Disorder of kidney and ureter, unspecified: Secondary | ICD-10-CM

## 2011-03-05 DIAGNOSIS — D649 Anemia, unspecified: Secondary | ICD-10-CM

## 2011-03-05 DIAGNOSIS — R001 Bradycardia, unspecified: Secondary | ICD-10-CM | POA: Diagnosis present

## 2011-03-05 DIAGNOSIS — Z888 Allergy status to other drugs, medicaments and biological substances status: Secondary | ICD-10-CM

## 2011-03-05 DIAGNOSIS — E139 Other specified diabetes mellitus without complications: Secondary | ICD-10-CM | POA: Diagnosis present

## 2011-03-05 DIAGNOSIS — D696 Thrombocytopenia, unspecified: Secondary | ICD-10-CM

## 2011-03-05 DIAGNOSIS — I9589 Other hypotension: Principal | ICD-10-CM | POA: Diagnosis present

## 2011-03-05 DIAGNOSIS — F17200 Nicotine dependence, unspecified, uncomplicated: Secondary | ICD-10-CM | POA: Diagnosis present

## 2011-03-05 DIAGNOSIS — Z882 Allergy status to sulfonamides status: Secondary | ICD-10-CM

## 2011-03-05 DIAGNOSIS — Z79899 Other long term (current) drug therapy: Secondary | ICD-10-CM

## 2011-03-05 DIAGNOSIS — I459 Conduction disorder, unspecified: Secondary | ICD-10-CM

## 2011-03-05 DIAGNOSIS — I129 Hypertensive chronic kidney disease with stage 1 through stage 4 chronic kidney disease, or unspecified chronic kidney disease: Secondary | ICD-10-CM | POA: Diagnosis present

## 2011-03-05 DIAGNOSIS — IMO0002 Reserved for concepts with insufficient information to code with codable children: Secondary | ICD-10-CM

## 2011-03-05 DIAGNOSIS — I498 Other specified cardiac arrhythmias: Secondary | ICD-10-CM | POA: Diagnosis present

## 2011-03-05 DIAGNOSIS — I10 Essential (primary) hypertension: Secondary | ICD-10-CM

## 2011-03-05 DIAGNOSIS — T380X5A Adverse effect of glucocorticoids and synthetic analogues, initial encounter: Secondary | ICD-10-CM | POA: Diagnosis present

## 2011-03-05 DIAGNOSIS — N19 Unspecified kidney failure: Secondary | ICD-10-CM

## 2011-03-05 DIAGNOSIS — M1990 Unspecified osteoarthritis, unspecified site: Secondary | ICD-10-CM | POA: Diagnosis present

## 2011-03-05 DIAGNOSIS — T463X5A Adverse effect of coronary vasodilators, initial encounter: Secondary | ICD-10-CM | POA: Diagnosis present

## 2011-03-05 DIAGNOSIS — I441 Atrioventricular block, second degree: Secondary | ICD-10-CM | POA: Diagnosis present

## 2011-03-05 DIAGNOSIS — K922 Gastrointestinal hemorrhage, unspecified: Secondary | ICD-10-CM

## 2011-03-05 DIAGNOSIS — N189 Chronic kidney disease, unspecified: Secondary | ICD-10-CM | POA: Diagnosis present

## 2011-03-05 DIAGNOSIS — R700 Elevated erythrocyte sedimentation rate: Secondary | ICD-10-CM

## 2011-03-05 DIAGNOSIS — K754 Autoimmune hepatitis: Secondary | ICD-10-CM | POA: Diagnosis present

## 2011-03-05 DIAGNOSIS — R748 Abnormal levels of other serum enzymes: Secondary | ICD-10-CM

## 2011-03-05 DIAGNOSIS — I951 Orthostatic hypotension: Secondary | ICD-10-CM | POA: Diagnosis present

## 2011-03-05 HISTORY — DX: Autoimmune hepatitis: K75.4

## 2011-03-05 LAB — URINALYSIS, ROUTINE W REFLEX MICROSCOPIC
Glucose, UA: NEGATIVE mg/dL
Hgb urine dipstick: NEGATIVE
Ketones, ur: NEGATIVE mg/dL
Leukocytes, UA: NEGATIVE
Nitrite: NEGATIVE
Protein, ur: NEGATIVE mg/dL
Specific Gravity, Urine: 1.02 (ref 1.005–1.030)
Urobilinogen, UA: 0.2 mg/dL (ref 0.0–1.0)
pH: 6 (ref 5.0–8.0)

## 2011-03-05 LAB — CBC
HCT: 27.6 % — ABNORMAL LOW (ref 36.0–46.0)
Hemoglobin: 9.4 g/dL — ABNORMAL LOW (ref 12.0–15.0)
MCH: 23 pg — ABNORMAL LOW (ref 26.0–34.0)
MCHC: 34.1 g/dL (ref 30.0–36.0)
MCV: 67.5 fL — ABNORMAL LOW (ref 78.0–100.0)
Platelets: 112 K/uL — ABNORMAL LOW (ref 150–400)
RBC: 4.09 MIL/uL (ref 3.87–5.11)
RDW: 20 % — ABNORMAL HIGH (ref 11.5–15.5)
WBC: 9.6 K/uL (ref 4.0–10.5)

## 2011-03-05 LAB — BASIC METABOLIC PANEL WITH GFR
Calcium: 10.7 mg/dL — ABNORMAL HIGH (ref 8.4–10.5)
Creatinine, Ser: 2.01 mg/dL — ABNORMAL HIGH (ref 0.50–1.10)
GFR calc Af Amer: 26 mL/min — ABNORMAL LOW (ref >90)
Sodium: 132 meq/L — ABNORMAL LOW (ref 135–145)

## 2011-03-05 LAB — DIFFERENTIAL
Basophils Absolute: 0 K/uL (ref 0.0–0.1)
Basophils Relative: 0 % (ref 0–1)
Eosinophils Absolute: 0.1 K/uL (ref 0.0–0.7)
Eosinophils Relative: 1 % (ref 0–5)
Lymphocytes Relative: 8 % — ABNORMAL LOW (ref 12–46)
Lymphs Abs: 0.7 K/uL (ref 0.7–4.0)
Monocytes Absolute: 0.5 10*3/uL (ref 0.1–1.0)
Monocytes Relative: 5 % (ref 3–12)
Neutro Abs: 8.3 10*3/uL — ABNORMAL HIGH (ref 1.7–7.7)
Neutrophils Relative %: 87 % — ABNORMAL HIGH (ref 43–77)

## 2011-03-05 LAB — COMPREHENSIVE METABOLIC PANEL
ALT: 115 U/L — ABNORMAL HIGH (ref 0–35)
Alkaline Phosphatase: 172 U/L — ABNORMAL HIGH (ref 39–117)
CO2: 18 mEq/L — ABNORMAL LOW (ref 19–32)
Creat: 1.7 mg/dL — ABNORMAL HIGH (ref 0.50–1.10)
Sodium: 136 mEq/L (ref 135–145)
Total Bilirubin: 1.6 mg/dL — ABNORMAL HIGH (ref 0.3–1.2)
Total Protein: 6.3 g/dL (ref 6.0–8.3)

## 2011-03-05 LAB — GLUCOSE, CAPILLARY
Glucose-Capillary: 204 mg/dL — ABNORMAL HIGH (ref 70–99)
Glucose-Capillary: 172 mg/dL — ABNORMAL HIGH (ref 70–99)

## 2011-03-05 LAB — BASIC METABOLIC PANEL
BUN: 43 mg/dL — ABNORMAL HIGH (ref 6–23)
CO2: 17 mEq/L — ABNORMAL LOW (ref 19–32)
Chloride: 104 mEq/L (ref 96–112)
GFR calc non Af Amer: 23 mL/min — ABNORMAL LOW (ref >90)
Glucose, Bld: 208 mg/dL — ABNORMAL HIGH (ref 70–99)
Potassium: 5.1 mEq/L (ref 3.5–5.1)

## 2011-03-05 LAB — PRO B NATRIURETIC PEPTIDE: Pro B Natriuretic peptide (BNP): 1299 pg/mL — ABNORMAL HIGH (ref 0–450)

## 2011-03-05 LAB — CARDIAC PANEL(CRET KIN+CKTOT+MB+TROPI): Troponin I: <0.3 ng/mL (ref <0.30)

## 2011-03-05 LAB — TROPONIN I: Troponin I: <0.3 ng/mL (ref <0.30)

## 2011-03-05 MED ORDER — GLUCAGON HCL (RDNA) 1 MG IJ SOLR
3.0000 mg | Freq: Once | INTRAMUSCULAR | Status: AC
  Administered 2011-03-05: 3 mg via INTRAVENOUS
  Filled 2011-03-05: qty 3

## 2011-03-05 MED ORDER — DOCUSATE SODIUM 100 MG PO CAPS
100.0000 mg | ORAL_CAPSULE | Freq: Two times a day (BID) | ORAL | Status: DC
  Administered 2011-03-05: 100 mg via ORAL
  Filled 2011-03-05: qty 1

## 2011-03-05 MED ORDER — ONDANSETRON HCL 4 MG/2ML IJ SOLN: 4.0000 mg | Freq: Four times a day (QID) | INTRAMUSCULAR | Status: DC | PRN

## 2011-03-05 MED ORDER — MULTI-VITAMIN/MINERALS PO TABS
1.0000 | ORAL_TABLET | Freq: Every day | ORAL | Status: DC
  Filled 2011-03-05 (×3): qty 1

## 2011-03-05 MED ORDER — SODIUM CHLORIDE 0.9 % IJ SOLN
3.0000 mL | Freq: Two times a day (BID) | INTRAMUSCULAR | Status: DC
  Administered 2011-03-05: 3 mL via INTRAVENOUS
  Filled 2011-03-05: qty 3

## 2011-03-05 MED ORDER — SODIUM CHLORIDE 0.9 % IV BOLUS (SEPSIS)
500.0000 mL | Freq: Once | INTRAVENOUS | Status: AC
  Administered 2011-03-05: 1000 mL via INTRAVENOUS

## 2011-03-05 MED ORDER — SODIUM CHLORIDE 0.9 % IV BOLUS (SEPSIS): 500.0000 mL | Freq: Once | INTRAVENOUS | Status: DC

## 2011-03-05 MED ORDER — INSULIN ASPART 100 UNIT/ML ~~LOC~~ SOLN: 0.0000 [IU] | Freq: Three times a day (TID) | SUBCUTANEOUS | Status: DC

## 2011-03-05 MED ORDER — PANTOPRAZOLE SODIUM 40 MG PO TBEC
40.0000 mg | DELAYED_RELEASE_TABLET | Freq: Every day | ORAL | Status: DC
  Administered 2011-03-05: 40 mg via ORAL
  Filled 2011-03-05: qty 1

## 2011-03-05 MED ORDER — ONDANSETRON HCL 4 MG PO TABS: 4.0000 mg | ORAL_TABLET | Freq: Four times a day (QID) | ORAL | Status: DC | PRN

## 2011-03-05 MED ORDER — SODIUM CHLORIDE 0.9 % IV SOLN
Freq: Once | INTRAVENOUS | Status: AC
  Administered 2011-03-05: 16:00:00 via INTRAVENOUS

## 2011-03-05 MED ORDER — PREDNISONE 10 MG PO TABS
10.0000 mg | ORAL_TABLET | Freq: Every day | ORAL | Status: DC
  Filled 2011-03-05: qty 1

## 2011-03-05 MED ORDER — MORPHINE SULFATE 2 MG/ML IJ SOLN: 1.0000 mg | INTRAMUSCULAR | Status: DC | PRN

## 2011-03-05 NOTE — ED Notes (Signed)
Family at bedside. Patient and family do not need anything at this time. 

## 2011-03-05 NOTE — ED Notes (Signed)
Dr ghim aware of pt.

## 2011-03-05 NOTE — ED Notes (Signed)
Beeped Dr. Karilyn Cota to [phone].

## 2011-03-05 NOTE — ED Provider Notes (Addendum)
History     CSN: [id number]  Arrival date & time 03/05/11  1501   First MD Initiated Contact with Patient 03/05/11 1524      Chief Complaint  Patient presents with  . Weakness    (Consider location/radiation/quality/duration/timing/severity/associated sxs/prior treatment) HPI Comments: Most history is obtained from the patient's daughter who is a critical care nurse. The patient began having complications about one year ago following a blood transfusion. She developed vasculitis and more recently has developed autoimmune hepatitis and is currently on prednisone. Approximately 3 weeks ago she began having minor episodes of generalized weakness and the patient's daughter reports that the patient has been having episodes of low blood pressure. Normally she takes 240 mg of long-acting diltiazem. They temporarily decreased her dosage during this time. The last few days, however the patient has gone back up to her usual dosage. She also was begun on Fosamax. Today approximately 2 PM the patient developed profound weakness again and the patient's daughter took her vital signs and found her blood pressure to be low and also with a slower heart beat than usual at around 60. Patient denies any chest or back pain. She denied any syncopal episode. She denied abdominal back or flank pain. She denies any recently vomiting or diarrhea. She denies any black or bloody stools. The patient simply endorses global fatigue and weakness. She reports she had a brief episode of nausea which has since resolved. No fevers or chills.    The history is provided by the patient and a relative.    Past Medical History  Diagnosis Date  . H/O: GI bleed d/t NSAIDS  . Anemia     fe def anemia  . DJD (degenerative joint disease)   . Hypertension   . Colitis, acute hx of   . Ileitis hx of  . Pancytopenia 09/03/2010  . Sedimentation rate elevation 09/03/2010  . Autoimmune hepatitis     Past Surgical History  Procedure Date   . Colonoscopy   . Upper gastrointestinal endoscopy     Family History  Problem Relation Age of Onset  . Stroke Mother   . Hypertension Sister   . Hypertension Brother     History  Substance Use Topics  . Smoking status: Current Some Day Smoker    Types: Cigarettes  . Smokeless tobacco: Not on file  . Alcohol Use: No    OB History    Grav Para Term Preterm Abortions TAB SAB Ect Mult Living                  Review of Systems  Constitutional: Positive for fatigue. Negative for fever, chills and appetite change.  Cardiovascular: Negative for chest pain and palpitations.  Gastrointestinal: Negative for abdominal pain.  Neurological: Positive for dizziness, weakness and light-headedness. Negative for headaches.  All other systems reviewed and are negative.    Allergies  Iohexol; Naprosyn; Red blood cells; Vicodin; and Sulfa antibiotics  Home Medications   Current Outpatient Rx  Name Route Sig Dispense Refill  . DEXLANSOPRAZOLE 60 MG PO CPDR Oral Take 60 mg by mouth daily.      Marland Kitchen LOSARTAN POTASSIUM 100 MG PO TABS Oral Take 100 mg by mouth daily.      . MULTI-VITAMIN/MINERALS PO TABS Oral Take 1 tablet by mouth daily.      Marland Kitchen POTASSIUM CHLORIDE PO Oral Take 10 mEq by mouth. Takes when she takes Torsemide    . PREDNISONE 5 MG PO TABS Oral Take 20 mg  by mouth daily.    Marland Kitchen SPIRONOLACTONE 50 MG PO TABS Oral Take 50 mg by mouth 2 (two) times daily. To start 01/17/11    . SULFABENZAMIDE POWD Does not apply by Does not apply route.      . TORSEMIDE 10 MG PO TABS Oral Take 10 mg by mouth as needed.    Marland Kitchen VERAPAMIL HCL 240 MG PO TBCR Oral Take 240 mg by mouth daily.        BP 114/62  Pulse 71  Temp(Src) 97.8 F (36.6 C) (Oral)  Resp 20  Ht 5\' 6"  (1.676 m)  Wt 165 lb (74.844 kg)  BMI 26.63 kg/m2  SpO2 98%  Physical Exam  Nursing note and vitals reviewed. Constitutional: She appears well-developed and well-nourished.  HENT:  Head: Normocephalic and atraumatic.  Eyes:  Conjunctivae and EOM are normal. No scleral icterus.  Neck: JVD present.  Cardiovascular: Normal rate, S1 normal and S2 normal.   Occasional extrasystoles are present.  Pulmonary/Chest: Effort normal and breath sounds normal.  Abdominal: Soft. Bowel sounds are normal. There is no tenderness. There is no rebound and no guarding.  Neurological: She is alert. She has normal strength.       No arm drift  Skin: Skin is warm and dry. No rash noted. No erythema.  Psychiatric: She has a normal mood and affect.    ED Course  Procedures (including critical care time)  CRITICAL CARE Performed by: Lear Ng.   Total critical care time: 40 min  Critical care time was exclusive of separately billable procedures and treating other patients.  Critical care was necessary to treat or prevent imminent or life-threatening deterioration.  Critical care was time spent personally by me on the following activities: development of treatment plan with patient and/or surrogate as well as nursing, discussions with consultants, evaluation of patient's response to treatment, examination of patient, obtaining history from patient or surrogate, ordering and performing treatments and interventions, ordering and review of laboratory studies, ordering and review of radiographic studies, pulse oximetry and re-evaluation of patient's condition.   Labs Reviewed  CBC - Abnormal; Notable for the following:    Hemoglobin 9.4 (*)    HCT 27.6 (*)    MCV 67.5 (*)    MCH 23.0 (*)    RDW 20.0 (*)    Platelets 112 (*)    All other components within normal limits  DIFFERENTIAL - Abnormal; Notable for the following:    Neutrophils Relative 87 (*)    Neutro Abs 8.3 (*)    Lymphocytes Relative 8 (*)    All other components within normal limits  BASIC METABOLIC PANEL - Abnormal; Notable for the following:    Sodium 132 (*)    CO2 17 (*)    Glucose, Bld 208 (*)    BUN 43 (*)    Creatinine, Ser 2.01 (*)    Calcium  10.7 (*)    GFR calc non Af Amer 23 (*)    GFR calc Af Amer 26 (*)    All other components within normal limits  URINALYSIS, ROUTINE W REFLEX MICROSCOPIC - Abnormal; Notable for the following:    Bilirubin Urine SMALL (*)    All other components within normal limits  PRO B NATRIURETIC PEPTIDE - Abnormal; Notable for the following:    Pro B Natriuretic peptide (BNP) 1299.0 (*)    All other components within normal limits  GLUCOSE, CAPILLARY - Abnormal; Notable for the following:    Glucose-Capillary 204 (*)  All other components within normal limits  TROPONIN I   Dg Chest Portable 1 View  03/05/2011  *RADIOLOGY REPORT*  Clinical Data: Weakness.  Heart block.  PORTABLE CHEST - 1 VIEW  Comparison: 04/23/2010.  Findings: 1542 hours.  Cardiomegaly and aortic atherosclerosis appear stable.  Previously noted interstitial prominence has resolved.  There is no edema, confluent airspace opacity or significant pleural effusion.  There is no pneumothorax.  Multiple telemetry leads overlie the chest.  IMPRESSION: Stable cardiomegaly with resolved pulmonary edema.  No acute cardiopulmonary process.  Original Report Authenticated By: Gerrianne Scale, M.D.     1. Heart block   2. Hypotension   3. Renal insufficiency   4. Anemia   5. Thrombocytopenia       sat is 100% and normal  ECG at time 15:16 initially shows sinus tachycardia at a rate of about 100 with a prolonged PR interval versus a slow SVT with retrograde P waves which then turned into a slower second-degree heart block with likely a retrograde P waves and a junctional escape rhythm or sinus escape rhythm with a rate of 70. Normal axis is noted. No ST or T wave changes are noted except for nonspecific T wave in lead to and V6 which may be due to the retrograde P waves.  MDM   Patient currently with a heart rate of 60-70, however arrhythmias noted showing an escape rhythm possibly to 2 second or third degree heart block with possibly a  junctional narrow complex tachycardia. Patient's blood pressure is marginal with a systolic of 85-90. Patient is awake and talkative with no current airway concerns. Her only symptom has generalized overall weakness. She did have one minor episode of nausea. She denies chest or back pain. Her only new medication is Fosamax. Also she has been on prednisone recently to 2 recent diagnosis of autoimmune hepatitis. She does take verapamil daily. She was given her usual medications this morning which is always taken. Otherwise no exact prodromal symptoms prior to symptom onset at about 2 PM this afternoon. Otherwise she had been feeling at her baseline prior to that episode.      5:02 PM Repeat EKG had too much artifact. A third EKG performed at 16:44 shows a sinus rhythm at rate 72 which is probably an ectopic P waves with normal axis, normal intervals and no ST or T-wave abnormalities. Patient reports that she feels improved and her blood pressure is 90/60. Again she denies any chest pain or shortness of breath. In my opinion the glucagon likely did improve her symptoms. I question whether or not the patient has sick sinus syndrome. I will discuss with the hospitalist here in consider speaking to a cardiologist down at [facility].   5:53 PM Spoke to Farina who would like for me to discuss with cardiology regarding what to watch for and if pt can stay here or should be transferred to [facility].     6:31 PM Spoke to Dr. Anne Fu who reviewed ECG's, thinks that this is not complete heart block, no need for pacer at this time, would stop diltiazem, will need to monitor as an atrial tachycardia may develop and require a different medication.  Thinks ok to be admitted at [facility].    Gavin Pound. Oletta Lamas, MD 03/05/11 1610  Gavin Pound. Oletta Lamas, MD 03/05/11 9604

## 2011-03-05 NOTE — Progress Notes (Signed)
CRITICAL VALUE ALERT  Critical value received:  CK[facility] 7.9  Date of notification:  03/05/11  Time of notification: 2155  Critical value read back:yes  Nurse who received alert:  Linwood Dibbles RN  MD notified (1st page):  Ranga  Time of first page:  2200  MD notified (2nd page): Ranga  Time of second page:2215  Responding MD:  Venetia Constable  Time MD responded:  2217

## 2011-03-05 NOTE — H&P (Signed)
PCP:   [name], MD, MD   Chief Complaint:  Low BP, lethargy since this afternoon.  HPI: Eileen Santana is a delightful 78 year old female, recently diagnosed of autoimmune hepatitis, now on prednisone, being tapered off, who comes in with hx of lethargy and low BP around 90 systolic, with Hr around 60, per her daughter, a former critical care nurse in the [facility] system, who was checking the vitals. Apparently, patient had a similar episode last week and her daughter had been cutting back patient's BP meds, which include losartan, Demadex, verapamil, spironolactone. Patient denies chest pains, and does not have a prior cardiac history. Lately, she was diagnosed of autoimmune hepattitis and started on prednisone. She also has renal insufficiency, which is relatively new, per her daughter. Today patient had EKG suggesting possible 2nd degree avb, and was given glucagon/calcium, in consultation with cardiology Dr Anne Fu, who recommended telemetry monitoring. Patient has received bolus normal saline , and she is hemodynamically stable with heart rate in the 70s, BP118 systolic.  Review of Systems:  Unremarkable, except as highlighted in hpi. Past Medical History: Past Medical History  Diagnosis Date  . H/O: GI bleed d/t NSAIDS  . Anemia     fe def anemia  . DJD (degenerative joint disease)   . Hypertension   . Colitis, acute hx of   . Ileitis hx of  . Pancytopenia 09/03/2010  . Sedimentation rate elevation 09/03/2010  . Autoimmune hepatitis    Past Surgical History  Procedure Date  . Colonoscopy   . Upper gastrointestinal endoscopy     Medications: Prior to Admission medications   Medication Sig Start Date End Date Taking? Authorizing Provider  alendronate (FOSAMAX) 70 MG tablet Take 70 mg by mouth every 7 (seven) days. Take with a full glass of water on an empty stomach.Patient takes on Saturday   Yes Historical Provider, MD  dexlansoprazole (DEXILANT) 60 MG capsule Take 60 mg by mouth  daily.     Yes Historical Provider, MD  Multiple Vitamins-Minerals (MULTIVITAMIN WITH MINERALS) tablet Take 1 tablet by mouth daily.     Yes Historical Provider, MD  predniSONE (DELTASONE) 10 MG tablet Take 10 mg by mouth daily. Patient is taking 3&1/2 tablets(35mg ) today(03/05/11) until Thursday then tapering off   Yes Historical Provider, MD  spironolactone (ALDACTONE) 50 MG tablet Take 25 mg by mouth every other day. To start 01/17/11   Yes Historical Provider, MD  torsemide (DEMADEX) 10 MG tablet Take 10 mg by mouth as needed.   Yes Historical Provider, MD  verapamil (CALAN-SR) 240 MG CR tablet Take 240 mg by mouth daily.     Yes Historical Provider, MD    Allergies:   Allergies  Allergen Reactions  . Iohexol Swelling    IV Dye   . Naprosyn (Naproxen)     Drowsy, hallucinations  . Red Blood Cells Swelling and Dermatitis    With blood transfusion 2012  . Vicodin (Hydrocodone-Acetaminophen) Other (See Comments)    hallucinations  . Sulfa Antibiotics Rash    Social History:  reports that she has been smoking Cigarettes.  She does not have any smokeless tobacco history on file. She reports that she does not drink alcohol or use illicit drugs.   Family History: Family History  Problem Relation Age of Onset  . Stroke Mother   . Hypertension Sister   . Hypertension Brother     Physical Exam: Filed Vitals:   03/05/11 1507 03/05/11 1545 03/05/11 1600 03/05/11 1728  BP:  95/53  90/59 114/62  Pulse: 60 61 62 71  Temp: 97.8 F (36.6 C)     TempSrc: Oral     Resp: 19 20 18 20   Height:      Weight:      SpO2: 100% 100% 100% 98%   Lying comfortably in bed. No jvd or carotid bruits. Lungs clear. CVS- S1S2 heard, no murmurs. RRR. Abdomen soft, non tender. +BS. No palpable organomegaly or masses. CNS- grossly intact. Extremities- tinge of pedal edema.   Labs on Admission:   [facility] 03/05/11 1522 03/04/11 0910  NA 132* 136  K 5.1 4.7  CL 104 107  CO2 17* 18*  GLUCOSE 208*  126*  BUN 43* 42*  CREATININE 2.01* 1.70*  CALCIUM 10.7* 10.3  MG -- --  PHOS -- --    Basename 03/04/11 0910  AST 163*  ALT 115*  ALKPHOS 172*  BILITOT 1.6*  PROT 6.3  ALBUMIN 3.1*   No results found for this basename: LIPASE:2,AMYLASE:2 in the last 72 hours  Basename 03/05/11 1522  WBC 9.6  NEUTROABS 8.3*  HGB 9.4*  HCT 27.6*  MCV 67.5*  PLT 112*    Basename 03/05/11 1538  CKTOTAL --  CKMB --  CKMBINDEX --  TROPONINI <0.30   No results found for this basename: TSH,T4TOTAL,FREET3,T3FREE,THYROIDAB in the last 72 hours No results found for this basename: VITAMINB12:2,FOLATE:2,FERRITIN:2,TIBC:2,IRON:2,RETICCTPCT:2 in the last 72 hours  Radiological Exams on Admission: US Abdomen Complete  02/22/2011  *RADIOLOGY REPORT*  Clinical Data:  Elevated liver enzymes, thrombocytopenia  COMPLETE ABDOMINAL ULTRASOUND  Comparison:  CT abdomen pelvis dated 09/09/2010  Findings:  Gallbladder:  No gallstones, gallbladder wall thickening, or pericholecystic fluid.  Negative sonographic Murphy's sign.  Common bile duct:  Measures 5 mm.  Liver:  No focal lesion identified.  Hyperechoic hepatic parenchyma, suggesting hepatic steatosis.  IVC:  Appears normal.  Pancreas:  Visualized portions are grossly unremarkable.  Spleen:  Measures 7.9 cm.  Right Kidney:  Measures 9.5 cm.  No mass or hydronephrosis.  Left Kidney:  Measures 11.7 cm.  Multiple cysts, the largest a 3.0 x 3.4 x 3.1 cm interpolar /renal sinus cyst.  Abdominal aorta:  No aneurysm identified.  IMPRESSION: Normal sonographic appearance of the gallbladder.  Hepatic steatosis.  Left renal cysts.  Original Report Authenticated By: Charline Bills, M.D.   Dg Chest Portable 1 View  03/05/2011  *RADIOLOGY REPORT*  Clinical Data: Weakness.  Heart block.  PORTABLE CHEST - 1 VIEW  Comparison: 04/23/2010.  Findings: 1542 hours.  Cardiomegaly and aortic atherosclerosis appear stable.  Previously noted interstitial prominence has resolved.  There  is no edema, confluent airspace opacity or significant pleural effusion.  There is no pneumothorax.  Multiple telemetry leads overlie the chest.  IMPRESSION: Stable cardiomegaly with resolved pulmonary edema.  No acute cardiopulmonary process.  Original Report Authenticated By: Gerrianne Scale, M.D.    Assessment Hypotension and bradycardia, in elderly patient on multiple antihypertensives, in background of hepattitis and renal failure. Medications may be playing significant role. She has hyperglycemia, likely steroid induced. Plan .Hypotension/Bradycardia- likely due to meds, but consider possibility of masking of more nefarious etiology. Hold antihypertensives. Cardiac enzymes/2decho. Telemetry monitoring/?cards consult. .Renal failure- ?new. Med element. Urine lytes. Renal ultrasound. Renal and hepatic dysfunction may be causing delay in med clearance. .Autoimmune hepatitis- defer mx to GI. Dvt/gi prophylaxis. Condition guarded. Dicussed plan of care with daughter at bed side.  Jaila Schellhorn [phone]. 03/05/2011, 8:26 PM

## 2011-03-05 NOTE — ED Notes (Signed)
Pt c/o weakness all over. Denies any pain/n/v/d or dizziness. Pt is alert/oriented. Generalized weakness observed. Pt has no hx of dm but cbg in route is 251. Pupils perrla.

## 2011-03-06 LAB — CBC
MCH: 22.1 pg — ABNORMAL LOW (ref 26.0–34.0)
Platelets: 84 10*3/uL — ABNORMAL LOW (ref 150–400)
RBC: 3.66 MIL/uL — ABNORMAL LOW (ref 3.87–5.11)

## 2011-03-06 LAB — PROTIME-INR: Prothrombin Time: 15.9 seconds — ABNORMAL HIGH (ref 11.6–15.2)

## 2011-03-06 LAB — URINALYSIS, ROUTINE W REFLEX MICROSCOPIC
Ketones, ur: NEGATIVE mg/dL
Leukocytes, UA: NEGATIVE
Nitrite: NEGATIVE
Protein, ur: NEGATIVE mg/dL
Urobilinogen, UA: 0.2 mg/dL (ref 0.0–1.0)
pH: 5.5 (ref 5.0–8.0)

## 2011-03-06 LAB — LIPID PANEL
Cholesterol: 196 mg/dL (ref 0–200)
HDL: 68 mg/dL (ref >39)
Total CHOL/HDL Ratio: 2.9 RATIO
Triglycerides: 41 mg/dL (ref <150)
VLDL: 8 mg/dL (ref 0–40)

## 2011-03-06 LAB — GLUCOSE, CAPILLARY: Glucose-Capillary: 110 mg/dL — ABNORMAL HIGH (ref 70–99)

## 2011-03-06 LAB — CARDIAC PANEL(CRET KIN+CKTOT+MB+TROPI)
Relative Index: INVALID (ref 0.0–2.5)
Troponin I: <0.3 ng/mL (ref <0.30)

## 2011-03-06 LAB — HEMOGLOBIN A1C: Mean Plasma Glucose: 140 mg/dL — ABNORMAL HIGH (ref <117)

## 2011-03-06 LAB — APTT: aPTT: 27 seconds (ref 24–37)

## 2011-03-06 LAB — TSH: TSH: 0.605 u[IU]/mL (ref 0.350–4.500)

## 2011-03-06 MED ORDER — METOPROLOL TARTRATE 25 MG PO TABS: 12.5000 mg | ORAL_TABLET | Freq: Two times a day (BID) | ORAL | Status: DC

## 2011-03-06 MED ORDER — METOPROLOL SUCCINATE ER 25 MG PO TB24: 12.5000 mg | ORAL_TABLET | Freq: Two times a day (BID) | ORAL | Status: DC

## 2011-03-06 NOTE — Discharge Summary (Signed)
Physician Discharge Summary  Patient ID: Eileen Santana MRN: [id number] DOB/AGE: 1930/06/05 78 y.o. Primary Care Physician:HILL,GERALD K, MD, MD Admit date: 03/05/2011 Discharge date: 03/06/2011    Discharge Diagnoses:  1. Hypotension, bradycardia secondary to verapamil, resolved. 2. Autoimmune hepatitis. 3. Chronic renal failure. 4. Hypertension.   Medication List  As of 03/06/2011  9:52 AM   STOP taking these medications         spironolactone 50 MG tablet      torsemide 10 MG tablet      verapamil 240 MG CR tablet         TAKE these medications         alendronate 70 MG tablet   Commonly known as: FOSAMAX   Take 70 mg by mouth every 7 (seven) days. Take with a full glass of water on an empty stomach.Patient takes on Saturday      DEXILANT 60 MG capsule   Generic drug: dexlansoprazole   Take 60 mg by mouth daily.      metoprolol tartrate 25 MG tablet   Commonly known as: LOPRESSOR   Take 0.5 tablets (12.5 mg total) by mouth 2 (two) times daily.      multivitamin with minerals tablet   Take 1 tablet by mouth daily.      predniSONE 10 MG tablet   Commonly known as: DELTASONE   Take 10 mg by mouth daily. Patient is taking 3&1/2 tablets(35mg ) today(03/05/11) until Thursday then tapering off            Discharged Condition: Stable and improved.    Consults: None.  Significant Diagnostic Studies: US Abdomen Complete  02/22/2011  *RADIOLOGY REPORT*  Clinical Data:  Elevated liver enzymes, thrombocytopenia  COMPLETE ABDOMINAL ULTRASOUND  Comparison:  CT abdomen pelvis dated 09/09/2010  Findings:  Gallbladder:  No gallstones, gallbladder wall thickening, or pericholecystic fluid.  Negative sonographic Murphy's sign.  Common bile duct:  Measures 5 mm.  Liver:  No focal lesion identified.  Hyperechoic hepatic parenchyma, suggesting hepatic steatosis.  IVC:  Appears normal.  Pancreas:  Visualized portions are grossly unremarkable.  Spleen:  Measures 7.9 cm.  Right Kidney:   Measures 9.5 cm.  No mass or hydronephrosis.  Left Kidney:  Measures 11.7 cm.  Multiple cysts, the largest a 3.0 x 3.4 x 3.1 cm interpolar /renal sinus cyst.  Abdominal aorta:  No aneurysm identified.  IMPRESSION: Normal sonographic appearance of the gallbladder.  Hepatic steatosis.  Left renal cysts.  Original Report Authenticated By: Charline Bills, M.D.   Dg Chest Portable 1 View  03/05/2011  *RADIOLOGY REPORT*  Clinical Data: Weakness.  Heart block.  PORTABLE CHEST - 1 VIEW  Comparison: 04/23/2010.  Findings: 1542 hours.  Cardiomegaly and aortic atherosclerosis appear stable.  Previously noted interstitial prominence has resolved.  There is no edema, confluent airspace opacity or significant pleural effusion.  There is no pneumothorax.  Multiple telemetry leads overlie the chest.  IMPRESSION: Stable cardiomegaly with resolved pulmonary edema.  No acute cardiopulmonary process.  Original Report Authenticated By: Gerrianne Scale, M.D.    Lab Results: Basic Metabolic Panel:  Basename 03/05/11 2100 03/05/11 1522 03/04/11 0910  NA -- 132* 136  K -- 5.1 4.7  CL -- 104 107  CO2 -- 17* 18*  GLUCOSE -- 208* 126*  BUN -- 43* 42*  CREATININE -- 2.01* 1.70*  CALCIUM -- 10.7* 10.3  MG 1.9 -- --  PHOS 3.1 -- --   Liver Function Tests:  [facility] 03/04/11 0910  AST 163*  ALT 115*  ALKPHOS 172*  BILITOT 1.6*  PROT 6.3  ALBUMIN 3.1*     CBC:  Basename 03/06/11 0409 03/05/11 1522  WBC 7.5 9.6  NEUTROABS -- 8.3*  HGB 8.1* 9.4*  HCT 24.8* 27.6*  MCV 67.8* 67.5*  PLT 84* 112*       Hospital Course: This 78 year old lady was admitted yesterday with hypotension and bradycardia. She had been taking verapamil 240 mg daily and she took a dose yesterday. Her blood pressure had been trending down and blood pressure medicines were also being adjusted. When she came to the emergency room, she was hypotensive and bradycardic. She appeared to be in a 2 to one block. She was given glucagon and  intravenous fluids and everything has resolved now. Serial cardiac enzymes are negative. Her blood pressure is not significantly elevated despite not having any medications on board. I note that her creatinine has increased to 2.01 in the last couple of days.  Discharge Exam: Blood pressure 136/75, pulse 98, temperature 98.8 F (37.1 C), temperature source Oral, resp. rate 20, height 5\' 6"  (1.676 m), weight 74.8 kg (164 lb 14.5 oz), SpO2 96.00%. She looks systemically well. Heart sounds are present and in sinus rhythm with a slight increased heart rate at rest. Lung fields are clear. She is alert and orientated. There are no focal neurological signs.  Disposition: Home. I think she needs to see a nephrologist in view of increased creatinine levels. I will encourage her to drink oral fluids. Have given this lady a prescription for metoprolol 12.5 mg twice a day should her blood pressure increase and be unsafe. Her daughter, who is a nursing background, understands to give this medicine if needed.  Discharge Orders    Future Appointments: Provider: Department: Dept Phone: Center:   03/14/2011 9:00 AM Randall An, MD [facility] [phone] None     Future Orders Please Complete By Expires   Diet - low sodium heart healthy      Increase activity slowly         Follow-up Information    Follow up with [facility] K, MD. Schedule an appointment as soon as possible for a visit in 2 weeks.      Follow up with [facility] S, MD. Schedule an appointment as soon as possible for a visit in 1 week.   Contact information:   [address] [place] [address] [phone]          SignedWilson Singer Pager [phone]  03/06/2011, 9:52 AM

## 2011-03-06 NOTE — Progress Notes (Signed)
Writer discussed and reviewed discharge instructions and medications, verbalized understanding.  Encouraged to call with any questions that may arise.  Pt refused morning medications, stated that she will take her own at home.  Encouraged follow up appts and importance to make Monday morning.  Pt took all belongings and staff member wheel chaired pt out in stable condition.

## 2011-03-10 ENCOUNTER — Telehealth (INDEPENDENT_AMBULATORY_CARE_PROVIDER_SITE_OTHER): Payer: Self-pay | Admitting: *Deleted

## 2011-03-10 ENCOUNTER — Ambulatory Visit (HOSPITAL_COMMUNITY)
Admission: RE | Admit: 2011-03-10 | Discharge: 2011-03-10 | Disposition: A | Discharge status: Home / Self Care | Payer: Medicare Other | Source: Ambulatory Visit | Attending: Cardiovascular Disease | Admitting: Cardiovascular Disease

## 2011-03-10 DIAGNOSIS — F17200 Nicotine dependence, unspecified, uncomplicated: Secondary | ICD-10-CM | POA: Insufficient documentation

## 2011-03-10 DIAGNOSIS — I300 Acute nonspecific idiopathic pericarditis: Secondary | ICD-10-CM | POA: Insufficient documentation

## 2011-03-10 DIAGNOSIS — I10 Essential (primary) hypertension: Secondary | ICD-10-CM | POA: Insufficient documentation

## 2011-03-10 DIAGNOSIS — K754 Autoimmune hepatitis: Secondary | ICD-10-CM

## 2011-03-10 NOTE — Progress Notes (Signed)
*  PRELIMINARY RESULTS* Echocardiogram 2D Echocardiogram has been performed.  Conrad Paul Smiths 03/10/2011, 9:23 AM

## 2011-03-10 NOTE — Telephone Encounter (Signed)
Per Delrae Rend the patient will need to have drawn on 03-14-11 the following labs: CBC/D, C- Met. Lab orders faxed to [facility].

## 2011-03-14 ENCOUNTER — Encounter (HOSPITAL_COMMUNITY): Payer: Self-pay | Admitting: Pharmacy Technician

## 2011-03-14 ENCOUNTER — Other Ambulatory Visit (INDEPENDENT_AMBULATORY_CARE_PROVIDER_SITE_OTHER): Payer: Self-pay | Admitting: *Deleted

## 2011-03-14 ENCOUNTER — Encounter (HOSPITAL_COMMUNITY): Payer: Self-pay | Admitting: Oncology

## 2011-03-14 ENCOUNTER — Encounter (HOSPITAL_COMMUNITY): Payer: Medicare Other | Attending: Oncology | Admitting: Oncology

## 2011-03-14 ENCOUNTER — Other Ambulatory Visit: Payer: Self-pay | Admitting: Physician Assistant

## 2011-03-14 DIAGNOSIS — D509 Iron deficiency anemia, unspecified: Secondary | ICD-10-CM

## 2011-03-14 DIAGNOSIS — R17 Unspecified jaundice: Secondary | ICD-10-CM

## 2011-03-14 DIAGNOSIS — G7111 Myotonic muscular dystrophy: Secondary | ICD-10-CM

## 2011-03-14 DIAGNOSIS — R7401 Elevation of levels of liver transaminase levels: Secondary | ICD-10-CM | POA: Insufficient documentation

## 2011-03-14 DIAGNOSIS — R748 Abnormal levels of other serum enzymes: Secondary | ICD-10-CM

## 2011-03-14 DIAGNOSIS — K754 Autoimmune hepatitis: Secondary | ICD-10-CM

## 2011-03-14 DIAGNOSIS — D696 Thrombocytopenia, unspecified: Secondary | ICD-10-CM

## 2011-03-14 DIAGNOSIS — D61818 Other pancytopenia: Secondary | ICD-10-CM | POA: Insufficient documentation

## 2011-03-14 DIAGNOSIS — R7402 Elevation of levels of lactic acid dehydrogenase (LDH): Secondary | ICD-10-CM | POA: Insufficient documentation

## 2011-03-14 DIAGNOSIS — M6281 Muscle weakness (generalized): Secondary | ICD-10-CM

## 2011-03-14 LAB — COMPREHENSIVE METABOLIC PANEL
Albumin: 2.3 g/dL — ABNORMAL LOW (ref 3.5–5.2)
BUN: 25 mg/dL — ABNORMAL HIGH (ref 6–23)
Calcium: 9.4 mg/dL (ref 8.4–10.5)
Chloride: 104 mEq/L (ref 96–112)
Creatinine, Ser: 1.38 mg/dL — ABNORMAL HIGH (ref 0.50–1.10)
Total Bilirubin: 2.6 mg/dL — ABNORMAL HIGH (ref 0.3–1.2)

## 2011-03-14 LAB — CBC
MCH: 22.5 pg — ABNORMAL LOW (ref 26.0–34.0)
MCHC: 33.3 g/dL (ref 30.0–36.0)
MCV: 67.6 fL — ABNORMAL LOW (ref 78.0–100.0)
Platelets: 96 10*3/uL — ABNORMAL LOW (ref 150–400)
RBC: 4.17 MIL/uL (ref 3.87–5.11)
RDW: 20.2 % — ABNORMAL HIGH (ref 11.5–15.5)

## 2011-03-14 LAB — CK: Total CK: 111 U/L (ref 7–177)

## 2011-03-14 LAB — IRON AND TIBC
Iron: 26 ug/dL — ABNORMAL LOW (ref 42–135)
Saturation Ratios: 8 % — ABNORMAL LOW (ref 20–55)

## 2011-03-14 LAB — FERRITIN: Ferritin: 61 ng/mL (ref 10–291)

## 2011-03-14 NOTE — Patient Instructions (Signed)
Eileen Santana  [id number] 1930-05-31   [facility]  Discharge Instructions  RECOMMENDATIONS MADE BY THE CONSULTANT AND ANY TEST RESULTS WILL BE SENT TO YOUR REFERRING DOCTOR.   EXAM FINDINGS BY MD TODAY AND SIGNS AND SYMPTOMS TO REPORT TO CLINIC OR PRIMARY MD: We need to do some labs today.  Dr. Mariel Sleet will talk with Dr. Karilyn Cota and Dr. Nickola Major and will be back in touch with you. MD thinks the difficulty walking is probably due to steroids.  Will make a Home Health referral for physical therapy for ambulation, and hip girdle musculature strengthening  MEDICATIONS PRESCRIBED: none     SPECIAL INSTRUCTIONS/FOLLOW-UP: Lab work Needed today and Return to Clinic to see MD in 1 month.   I acknowledge that I have been informed and understand all the instructions given to me and received a copy. I do not have any more questions at this time, but understand that I may call the Specialty Clinic at [facility] at [phone] during business hours should I have any further questions or need assistance in obtaining follow-up care.    __________________________________________  _____________  __________ Signature of Patient or Authorized Representative            Date                   Time    __________________________________________ Nurse's Signature

## 2011-03-15 ENCOUNTER — Ambulatory Visit (HOSPITAL_COMMUNITY)
Admission: RE | Admit: 2011-03-15 | Discharge: 2011-03-15 | Disposition: A | Discharge status: Home / Self Care | Payer: Medicare Other | Source: Ambulatory Visit | Attending: Internal Medicine | Admitting: Internal Medicine

## 2011-03-15 DIAGNOSIS — R17 Unspecified jaundice: Secondary | ICD-10-CM

## 2011-03-15 DIAGNOSIS — K754 Autoimmune hepatitis: Secondary | ICD-10-CM | POA: Insufficient documentation

## 2011-03-15 LAB — CBC
HCT: 27.8 % — ABNORMAL LOW (ref 36.0–46.0)
MCH: 22.1 pg — ABNORMAL LOW (ref 26.0–34.0)
MCHC: 33.1 g/dL (ref 30.0–36.0)
MCV: 66.7 fL — ABNORMAL LOW (ref 78.0–100.0)
RDW: 20.1 % — ABNORMAL HIGH (ref 11.5–15.5)

## 2011-03-15 LAB — GAMMA GT: GGT: 583 U/L — ABNORMAL HIGH (ref 7–51)

## 2011-03-15 MED ORDER — FENTANYL CITRATE 0.05 MG/ML IJ SOLN
INTRAMUSCULAR | Status: AC | PRN
  Administered 2011-03-15: 50 ug via INTRAVENOUS
  Administered 2011-03-15: 25 ug via INTRAVENOUS

## 2011-03-15 MED ORDER — FENTANYL CITRATE 0.05 MG/ML IJ SOLN
INTRAMUSCULAR | Status: AC
  Filled 2011-03-15: qty 4

## 2011-03-15 MED ORDER — SODIUM CHLORIDE 0.9 % IV SOLN
INTRAVENOUS | Status: DC
  Administered 2011-03-15: 09:00:00 via INTRAVENOUS

## 2011-03-15 MED ORDER — MIDAZOLAM HCL 5 MG/5ML IJ SOLN
INTRAMUSCULAR | Status: AC | PRN
  Administered 2011-03-15: 1 mg via INTRAVENOUS
  Administered 2011-03-15: 0.5 mg via INTRAVENOUS

## 2011-03-15 MED ORDER — MIDAZOLAM HCL 2 MG/2ML IJ SOLN
INTRAMUSCULAR | Status: AC
  Filled 2011-03-15: qty 4

## 2011-03-15 NOTE — Procedures (Signed)
Ultrasound guided liver biopsy.  2 cores.  Gelfoam used in biopsy tract.  No immediate complication.

## 2011-03-15 NOTE — Progress Notes (Signed)
This office note has been dictated.

## 2011-03-15 NOTE — Progress Notes (Signed)
CC:   Eileen Santana, M.D. [name]. Eileen Chance, MD Eileen Jordan, MD  DIAGNOSES: 1. Inflammatory disorder consistent with rheumatoid arthritis,     followed by Dr. Nickola Major now on 40 mg of prednisone a day. 2. Abnormal liver enzymes, probably from an autoimmune disorder versus     infectious etiology. 3. Iron-deficiency at presentation with thrombocytopenia status post     intravenous iron. 4. Degenerative joint disease. 5. 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 6. History of hypertension. 7. Chronic leg swelling. 8. Chronic renal insufficiency. 9. Thrombocytopenia most likely from an autoimmune disorder. She is here today with her daughter, Eileen Santana.  She has really deteriorated in the last 2 weeks.  She is now in a wheelchair.  She could not walk up here easily.  She cannot get out of a chair without typically pushing off with her hands.  She was able to get out of the wheelchair 1 time without using her hands, but she almost had the lean so far forward she almost fell towards the floor.  She has tremendous weakness in the hip girdle musculature, quadriceps, psoas of course.  She also has some weakness in the proximal musculature of the upper extremities, but it is not nearly as noticeable.  She can be easily overcome below.  She still has puffiness of the pretibial areas.  She is becoming more cushingoid in the face.  Her labs, of course, I reviewed with her today which still show very abnormal liver enzymes.  I ordered a GGT which came back also quite abnormal at 583.  Her total bilirubin was 2.6, but she was not clearly jaundiced.  She looks more and more chronically ill.  Her hemoglobin has at least stabilized around the 9.4 g range.  Her white count is 8100. Platelets are still stable around 96,000.  So [name] has lost quite a bit of ground on several fronts, 1 of which is her liver enzymes are quite abnormal and she has developed  profound weakness, cannot walk well anymore without assistance.  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  In spite of the 40 mg of prednisone for the last several weeks, she has really not improved, she has only worsened.  Her liver enzymes have also worsened.  So I discussed her in detail with Dr. Karilyn Cota  by phone and Dr. Zenovia Santana by phone.  We all feel that we need to pursue a liver biopsy to get to the bottom of what is going on in her liver.  If she needs another agent such as Imuran, then that can be added to her steroids and hopefully the steroids can be tapered, but she has become very, very ill with this inflammatory disorder.  Whether it is all related to RA is unclear at this time.  Dr. Karilyn Cota will set up her liver biopsy in the very near future.    ______________________________ Ladona Horns. Mariel Sleet, MD ESN/MEDQ  D:  03/15/2011  T:  03/15/2011  Job:  [id number]

## 2011-03-15 NOTE — H&P (Signed)
Eileen Santana is an 78 y.o. female.   Chief Complaint: "I'm here for a liver biopsy" HPI: Patient with history of autoimmune hepatitis presents today for US guided random liver biopsy.  Past Medical History  Diagnosis Date  . H/O: GI bleed d/t NSAIDS  . Anemia     fe def anemia  . DJD (degenerative joint disease)   . Hypertension   . Colitis, acute hx of   . Ileitis hx of  . Pancytopenia 09/03/2010  . Sedimentation rate elevation 09/03/2010  . Autoimmune hepatitis   . Heart block AV second degree March 2013    Past Surgical History  Procedure Date  . Colonoscopy   . Upper gastrointestinal endoscopy     Family History  Problem Relation Age of Onset  . Stroke Mother   . Hypertension Sister   . Hypertension Brother    Social History:  reports that she has been smoking Cigarettes.  She does not have any smokeless tobacco history on file. She reports that she does not drink alcohol or use illicit drugs.  Allergies:  Allergies  Allergen Reactions  . Iohexol Swelling    IV Dye   . Naprosyn (Naproxen)     Drowsy, hallucinations  . Red Blood Cells Swelling and Dermatitis    With blood transfusion 2012  . Verapamil     Heart block (2nd degree)  . Vicodin (Hydrocodone-Acetaminophen) Other (See Comments)    hallucinations  . Sulfa Antibiotics Rash    Medications Prior to Admission  Medication Sig Dispense Refill  . alendronate (FOSAMAX) 70 MG tablet Take 70 mg by mouth every 7 (seven) days. Take with a full glass of water on an empty stomach.Patient takes on Saturday      . amLODipine (NORVASC) 2.5 MG tablet Take 2.5 mg by mouth daily.      . Coenzyme Q10 (CO Q 10) 100 MG CAPS Take 200 mg by mouth daily.      Marland Kitchen dexlansoprazole (DEXILANT) 60 MG capsule Take 60 mg by mouth daily.        . fish oil-omega-3 fatty acids 1000 MG capsule Take 1 g by mouth daily.      . Multiple Vitamins-Minerals (MULTIVITAMIN WITH MINERALS) tablet Take 1 tablet by mouth daily.        .  predniSONE (DELTASONE) 10 MG tablet Take 40 mg by mouth daily.        Medications Prior to Admission  Medication Dose Route Frequency Provider Last Rate Last Dose  . 0.9 %  sodium chloride infusion   Intravenous Continuous Abundio Miu, MD 20 mL/hr at 03/15/11 [id number]      Results for orders placed in visit on 03/14/11 (from the past 48 hour(s))  CBC     Status: Abnormal   Collection Time   03/14/11  9:52 AM      Component Value Range Comment   WBC 8.1  4.0 - 10.5 (K/uL)    RBC 4.17  3.87 - 5.11 (MIL/uL)    Hemoglobin 9.4 (*) 12.0 - 15.0 (g/dL)    HCT 96.0 (*) 45.4 - 46.0 (%)    MCV 67.6 (*) 78.0 - 100.0 (fL)    MCH 22.5 (*) 26.0 - 34.0 (pg)    MCHC 33.3  30.0 - 36.0 (g/dL)    RDW 09.8 (*) 11.9 - 15.5 (%)    Platelets 96 (*) 150 - 400 (K/uL)   SEDIMENTATION RATE     Status: Abnormal   Collection Time  03/14/11  9:52 AM      Component Value Range Comment   Sed Rate 37 (*) 0 - 22 (mm/hr)   COMPREHENSIVE METABOLIC PANEL     Status: Abnormal   Collection Time   03/14/11  9:52 AM      Component Value Range Comment   Sodium 133 (*) 135 - 145 (mEq/L)    Potassium 3.8  3.5 - 5.1 (mEq/L)    Chloride 104  96 - 112 (mEq/L)    CO2 22  19 - 32 (mEq/L)    Glucose, Bld 202 (*) 70 - 99 (mg/dL)    BUN 25 (*) 6 - 23 (mg/dL)    Creatinine, Ser 1.61 (*) 0.50 - 1.10 (mg/dL)    Calcium 9.4  8.4 - 10.5 (mg/dL)    Total Protein 5.9 (*) 6.0 - 8.3 (g/dL)    Albumin 2.3 (*) 3.5 - 5.2 (g/dL)    AST 096 (*) 0 - 37 (U/L)    ALT 270 (*) 0 - 35 (U/L)    Alkaline Phosphatase 352 (*) 39 - 117 (U/L)    Total Bilirubin 2.6 (*) 0.3 - 1.2 (mg/dL)    GFR calc non Af Amer 36 (*) >90 (mL/min)    GFR calc Af Amer 41 (*) >90 (mL/min)   GAMMA GT     Status: Abnormal   Collection Time   03/14/11  9:52 AM      Component Value Range Comment   GGT 583 (*) 7 - 51 (U/L)   FERRITIN     Status: Normal   Collection Time   03/14/11  9:52 AM      Component Value Range Comment   Ferritin 61  10 - 291 (ng/mL)   IRON AND  TIBC     Status: Abnormal   Collection Time   03/14/11  9:52 AM      Component Value Range Comment   Iron 26 (*) 42 - 135 (ug/dL)    TIBC 045  409 - 811 (ug/dL) TIBC and %SAT were not calculated due to the UIBC being >575.   Saturation Ratios 8 (*) 20 - 55 (%) TIBC and %SAT were not calculated due to the UIBC being >575.   UIBC 311  125 - 400 (ug/dL)   VITAMIN B14     Status: Normal   Collection Time   03/14/11  9:52 AM      Component Value Range Comment   Vitamin B-12 736  211 - 911 (pg/mL)   FOLATE     Status: Normal   Collection Time   03/14/11  9:52 AM      Component Value Range Comment   Folate >20.0     CK     Status: Normal   Collection Time   03/14/11  1:14 PM      Component Value Range Comment   Total CK 111  7 - 177 (U/L)    Results for orders placed during the hospital encounter of 03/15/11  CBC      Component Value Range   WBC 8.2  4.0 - 10.5 (K/uL)   RBC 4.17  3.87 - 5.11 (MIL/uL)   Hemoglobin 9.2 (*) 12.0 - 15.0 (g/dL)   HCT 78.2 (*) 95.6 - 46.0 (%)   MCV 66.7 (*) 78.0 - 100.0 (fL)   MCH 22.1 (*) 26.0 - 34.0 (pg)   MCHC 33.1  30.0 - 36.0 (g/dL)   RDW 21.3 (*) 08.6 - 15.5 (%)   Platelets 73 (*) 150 -  400 (K/uL)  PROTIME-INR      Component Value Range   Prothrombin Time 14.2  11.6 - 15.2 (seconds)   INR 1.08  0.00 - 1.49     Review of Systems  Constitutional: Positive for malaise/fatigue. Negative for fever and chills.  Respiratory: Negative for cough and shortness of breath.   Cardiovascular: Negative for chest pain.  Gastrointestinal: Negative for nausea, vomiting and abdominal pain.  Neurological: Positive for weakness. Negative for headaches.  Endo/Heme/Allergies: Does not bruise/bleed easily.    Blood pressure 142/76, pulse 107, temperature 97.2 F (36.2 C), temperature source Oral, resp. rate 16, height 5\' 6"  (1.676 m), weight 172 lb (78.019 kg), SpO2 98.00%. Physical Exam  Constitutional: She is oriented to person, place, and time. She appears  well-developed and well-nourished.  Cardiovascular:       Irregular rhythm, tachycardic  Respiratory: Effort normal and breath sounds normal.  GI: Soft. Bowel sounds are normal. There is no tenderness.  Musculoskeletal: Normal range of motion. She exhibits edema.  Neurological: She is alert and oriented to person, place, and time.     Assessment/Plan Patient with history of autoimmune hepatitis; plan is for US guided random core liver biopsy.  Zandria Woldt,D KEVIN 03/15/2011, 9:15 AM

## 2011-03-15 NOTE — ED Notes (Signed)
Dressing CDI, no pain or C/O.

## 2011-03-15 NOTE — ED Notes (Signed)
RUQ bandaid CDI.  Denies pain.

## 2011-03-16 ENCOUNTER — Other Ambulatory Visit (INDEPENDENT_AMBULATORY_CARE_PROVIDER_SITE_OTHER): Payer: Self-pay | Admitting: Internal Medicine

## 2011-03-16 ENCOUNTER — Telehealth (HOSPITAL_COMMUNITY): Payer: Self-pay

## 2011-03-16 ENCOUNTER — Encounter (INDEPENDENT_AMBULATORY_CARE_PROVIDER_SITE_OTHER): Payer: Self-pay | Admitting: *Deleted

## 2011-03-16 ENCOUNTER — Telehealth (INDEPENDENT_AMBULATORY_CARE_PROVIDER_SITE_OTHER): Payer: Self-pay | Admitting: Internal Medicine

## 2011-03-16 DIAGNOSIS — R17 Unspecified jaundice: Secondary | ICD-10-CM

## 2011-03-16 DIAGNOSIS — K754 Autoimmune hepatitis: Secondary | ICD-10-CM

## 2011-03-16 NOTE — Telephone Encounter (Signed)
Dr. Frederica Kuster called to review liver biopsy results on [name]. I reviewed patient's prior workup with him. He sees changes suggestive of obstructive pattern and not one of autoimmune liver injury. Prior ultrasound and CT were negative for biliary dilation. He has requested a second opinion from hepatologist at [facility]. Liver biopsy interpretation may be difficult since she's been on prednisone. It is possible that she has developed second process which would explain sudden bump in her transaminases and alkaline phosphatase. Her GGT done by Dr. Jerelyn Scott was more than 10 folds elevated. Biopsy results reviewed with patient's daughter Dr. Ronnald Ramp. We'll schedule another ultrasound to find that if she has evidence of biliary obstruction in which case she would need an ERCP.

## 2011-03-16 NOTE — Telephone Encounter (Signed)
Per dr Karilyn Cota, patient needs abd U/S, elevated transaminases, elevated bilirubin  This encounter was created in error - please disregard.

## 2011-03-17 ENCOUNTER — Ambulatory Visit (HOSPITAL_COMMUNITY)
Admission: RE | Admit: 2011-03-17 | Discharge: 2011-03-17 | Disposition: A | Discharge status: Home / Self Care | Payer: Medicare Other | Source: Ambulatory Visit | Attending: Internal Medicine | Admitting: Internal Medicine

## 2011-03-17 DIAGNOSIS — R17 Unspecified jaundice: Secondary | ICD-10-CM

## 2011-03-17 DIAGNOSIS — R932 Abnormal findings on diagnostic imaging of liver and biliary tract: Secondary | ICD-10-CM | POA: Insufficient documentation

## 2011-03-17 DIAGNOSIS — R748 Abnormal levels of other serum enzymes: Secondary | ICD-10-CM | POA: Insufficient documentation

## 2011-03-19 ENCOUNTER — Other Ambulatory Visit (INDEPENDENT_AMBULATORY_CARE_PROVIDER_SITE_OTHER): Payer: Self-pay | Admitting: Internal Medicine

## 2011-03-19 DIAGNOSIS — K754 Autoimmune hepatitis: Secondary | ICD-10-CM

## 2011-03-19 LAB — HEPATIC FUNCTION PANEL
AST: 256 U/L — ABNORMAL HIGH (ref 0–37)
Albumin: 2.2 g/dL — ABNORMAL LOW (ref 3.5–5.2)
Alkaline Phosphatase: 358 U/L — ABNORMAL HIGH (ref 39–117)
Total Bilirubin: 2.9 mg/dL — ABNORMAL HIGH (ref 0.3–1.2)
Total Protein: 5.4 g/dL — ABNORMAL LOW (ref 6.0–8.3)

## 2011-03-20 ENCOUNTER — Other Ambulatory Visit (INDEPENDENT_AMBULATORY_CARE_PROVIDER_SITE_OTHER): Payer: Self-pay | Admitting: Internal Medicine

## 2011-03-20 DIAGNOSIS — K7689 Other specified diseases of liver: Secondary | ICD-10-CM

## 2011-03-20 MED ORDER — URSODIOL 250 MG PO TABS: 500.0000 mg | ORAL_TABLET | Freq: Three times a day (TID) | ORAL | Status: DC

## 2011-03-23 ENCOUNTER — Telehealth (HOSPITAL_COMMUNITY): Payer: Self-pay

## 2011-03-23 ENCOUNTER — Other Ambulatory Visit (INDEPENDENT_AMBULATORY_CARE_PROVIDER_SITE_OTHER): Payer: Self-pay | Admitting: *Deleted

## 2011-03-23 DIAGNOSIS — R935 Abnormal findings on diagnostic imaging of other abdominal regions, including retroperitoneum: Secondary | ICD-10-CM

## 2011-03-23 DIAGNOSIS — R17 Unspecified jaundice: Secondary | ICD-10-CM

## 2011-03-23 DIAGNOSIS — K754 Autoimmune hepatitis: Secondary | ICD-10-CM

## 2011-03-23 DIAGNOSIS — K760 Fatty (change of) liver, not elsewhere classified: Secondary | ICD-10-CM

## 2011-03-23 NOTE — Progress Notes (Signed)
Per Dr Karilyn Cota, patient needs to be sch'd for MRA w/ & w/out, looking for veno occlusive disease

## 2011-03-23 NOTE — Telephone Encounter (Signed)
Patient's daughter called concerned about "4+ edema" to BLE.  Dr. Mariel Sleet notified and prescription called in to [facility] for Spirinolactone 50mg  PO BID for 30 days.  Daughter notified of new prescription.

## 2011-03-24 ENCOUNTER — Other Ambulatory Visit (INDEPENDENT_AMBULATORY_CARE_PROVIDER_SITE_OTHER): Payer: Self-pay | Admitting: *Deleted

## 2011-03-24 ENCOUNTER — Other Ambulatory Visit (HOSPITAL_COMMUNITY): Payer: Medicare Other

## 2011-03-24 DIAGNOSIS — K754 Autoimmune hepatitis: Secondary | ICD-10-CM

## 2011-03-24 DIAGNOSIS — R17 Unspecified jaundice: Secondary | ICD-10-CM

## 2011-03-25 ENCOUNTER — Ambulatory Visit (HOSPITAL_COMMUNITY)
Admission: RE | Admit: 2011-03-25 | Discharge: 2011-03-25 | Disposition: A | Discharge status: Home / Self Care | Payer: Medicare Other | Source: Ambulatory Visit | Attending: Internal Medicine | Admitting: Internal Medicine

## 2011-03-25 ENCOUNTER — Ambulatory Visit (HOSPITAL_COMMUNITY): Admission: RE | Admit: 2011-03-25 | Payer: Medicare Other | Source: Ambulatory Visit

## 2011-03-25 DIAGNOSIS — R17 Unspecified jaundice: Secondary | ICD-10-CM

## 2011-03-25 DIAGNOSIS — R188 Other ascites: Secondary | ICD-10-CM | POA: Insufficient documentation

## 2011-03-25 DIAGNOSIS — K754 Autoimmune hepatitis: Secondary | ICD-10-CM | POA: Insufficient documentation

## 2011-03-28 ENCOUNTER — Telehealth (INDEPENDENT_AMBULATORY_CARE_PROVIDER_SITE_OTHER): Payer: Self-pay | Admitting: *Deleted

## 2011-03-28 DIAGNOSIS — R945 Abnormal results of liver function studies: Secondary | ICD-10-CM

## 2011-03-28 NOTE — Telephone Encounter (Signed)
Per Dr. Karilyn Cota the patient will need to have this lab 04-01-11.

## 2011-03-30 ENCOUNTER — Telehealth (INDEPENDENT_AMBULATORY_CARE_PROVIDER_SITE_OTHER): Payer: Self-pay | Admitting: Internal Medicine

## 2011-03-30 ENCOUNTER — Telehealth (INDEPENDENT_AMBULATORY_CARE_PROVIDER_SITE_OTHER): Payer: Self-pay | Admitting: *Deleted

## 2011-03-30 NOTE — Telephone Encounter (Signed)
I talked with Dr. Nickola Major; she is in agreement with starting patient on azathioprine. Her daughter was TPMT deficient; therefore will check TPMT assay .

## 2011-03-30 NOTE — Telephone Encounter (Signed)
The TMPT could not be ordered from Epic , so I sent a req (old form up stairs to lab).

## 2011-03-30 NOTE — Telephone Encounter (Signed)
A req was sent. As i could not find this test in Epic.

## 2011-03-31 ENCOUNTER — Other Ambulatory Visit (INDEPENDENT_AMBULATORY_CARE_PROVIDER_SITE_OTHER): Payer: Self-pay | Admitting: Internal Medicine

## 2011-04-01 LAB — COMPREHENSIVE METABOLIC PANEL
ALT: 97 U/L — ABNORMAL HIGH (ref 0–35)
AST: 144 U/L — ABNORMAL HIGH (ref 0–37)
CO2: 24 mEq/L (ref 19–32)
Sodium: 135 mEq/L (ref 135–145)
Total Bilirubin: 5 mg/dL — ABNORMAL HIGH (ref 0.3–1.2)
Total Protein: 5.5 g/dL — ABNORMAL LOW (ref 6.0–8.3)

## 2011-04-01 LAB — CBC WITH DIFFERENTIAL/PLATELET
Basophils Absolute: 0 10*3/uL (ref 0.0–0.1)
Eosinophils Absolute: 0 10*3/uL (ref 0.0–0.7)
Lymphocytes Relative: 6 % — ABNORMAL LOW (ref 12–46)
Lymphs Abs: 0.6 10*3/uL — ABNORMAL LOW (ref 0.7–4.0)
Neutrophils Relative %: 90 % — ABNORMAL HIGH (ref 43–77)
Platelets: 149 10*3/uL — ABNORMAL LOW (ref 150–400)
RBC: 4.24 MIL/uL (ref 3.87–5.11)
WBC: 10.1 10*3/uL (ref 4.0–10.5)

## 2011-04-01 LAB — HEPATIC FUNCTION PANEL
Albumin: 2.9 g/dL — ABNORMAL LOW (ref 3.5–5.2)
Alkaline Phosphatase: 237 U/L — ABNORMAL HIGH (ref 39–117)
Total Protein: 5.5 g/dL — ABNORMAL LOW (ref 6.0–8.3)

## 2011-04-08 ENCOUNTER — Other Ambulatory Visit (INDEPENDENT_AMBULATORY_CARE_PROVIDER_SITE_OTHER): Payer: Self-pay | Admitting: Internal Medicine

## 2011-04-08 DIAGNOSIS — K754 Autoimmune hepatitis: Secondary | ICD-10-CM

## 2011-04-08 MED ORDER — INSULIN ASPART 100 UNIT/ML ~~LOC~~ SOLN: 4.0000 [IU] | Freq: Three times a day (TID) | SUBCUTANEOUS | Status: DC

## 2011-04-08 MED ORDER — AZATHIOPRINE 50 MG PO TABS: 75.0000 mg | ORAL_TABLET | Freq: Every day | ORAL | Status: DC

## 2011-04-12 ENCOUNTER — Telehealth (INDEPENDENT_AMBULATORY_CARE_PROVIDER_SITE_OTHER): Payer: Self-pay | Admitting: *Deleted

## 2011-04-12 DIAGNOSIS — K754 Autoimmune hepatitis: Secondary | ICD-10-CM

## 2011-04-12 NOTE — Telephone Encounter (Signed)
Per Dr. Karilyn Cota the patient will need to have labs drawn on 04-23-11 and a office visit on 04-26-11.

## 2011-04-18 ENCOUNTER — Encounter (HOSPITAL_COMMUNITY): Payer: Self-pay | Admitting: Oncology

## 2011-04-18 ENCOUNTER — Encounter (HOSPITAL_COMMUNITY): Payer: Medicare Other | Attending: Oncology | Admitting: Oncology

## 2011-04-18 VITALS — BP 132/87 | HR 88 | Temp 98.9°F | Wt 158.1 lb

## 2011-04-18 DIAGNOSIS — M359 Systemic involvement of connective tissue, unspecified: Secondary | ICD-10-CM

## 2011-04-18 DIAGNOSIS — D649 Anemia, unspecified: Secondary | ICD-10-CM | POA: Insufficient documentation

## 2011-04-18 DIAGNOSIS — R5381 Other malaise: Secondary | ICD-10-CM

## 2011-04-18 DIAGNOSIS — D61818 Other pancytopenia: Secondary | ICD-10-CM | POA: Insufficient documentation

## 2011-04-18 LAB — COMPREHENSIVE METABOLIC PANEL
Albumin: 2.4 g/dL — ABNORMAL LOW (ref 3.5–5.2)
Alkaline Phosphatase: 176 U/L — ABNORMAL HIGH (ref 39–117)
BUN: 24 mg/dL — ABNORMAL HIGH (ref 6–23)
Creatinine, Ser: 1.35 mg/dL — ABNORMAL HIGH (ref 0.50–1.10)
GFR calc Af Amer: 42 mL/min — ABNORMAL LOW (ref >90)
Glucose, Bld: 226 mg/dL — ABNORMAL HIGH (ref 70–99)
Potassium: 4.6 mEq/L (ref 3.5–5.1)
Total Bilirubin: 4.1 mg/dL — ABNORMAL HIGH (ref 0.3–1.2)
Total Protein: 6.5 g/dL (ref 6.0–8.3)

## 2011-04-18 LAB — RETICULOCYTES
Retic Count, Absolute: 179.3 10*3/uL (ref 19.0–186.0)
Retic Ct Pct: 4.3 % — ABNORMAL HIGH (ref 0.4–3.1)

## 2011-04-18 LAB — CBC
HCT: 28.8 % — ABNORMAL LOW (ref 36.0–46.0)
Hemoglobin: 8.8 g/dL — ABNORMAL LOW (ref 12.0–15.0)
MCH: 21.1 pg — ABNORMAL LOW (ref 26.0–34.0)
MCHC: 30.6 g/dL (ref 30.0–36.0)
MCV: 69.1 fL — ABNORMAL LOW (ref 78.0–100.0)
RDW: 21 % — ABNORMAL HIGH (ref 11.5–15.5)

## 2011-04-18 LAB — GAMMA GT: GGT: 98 U/L — ABNORMAL HIGH (ref 7–51)

## 2011-04-18 NOTE — Patient Instructions (Signed)
Eileen Santana  [id number] 1930/06/30   [facility]  Discharge Instructions  RECOMMENDATIONS MADE BY THE CONSULTANT AND ANY TEST RESULTS WILL BE SENT TO YOUR REFERRING DOCTOR.   EXAM FINDINGS BY MD TODAY AND SIGNS AND SYMPTOMS TO REPORT TO CLINIC OR PRIMARY MD: Exam & Discussion per MD.  May want to do trial of aranesp or procrit.  Need to check some labs first.n  MEDICATIONS PRESCRIBED: none   INSTRUCTIONS GIVEN AND DISCUSSED: Other :  Report increased shortness of breath or other problems.  SPECIAL INSTRUCTIONS/FOLLOW-UP: Lab work Needed today and Return to Clinic in 3 months to see MD.   I acknowledge that I have been informed and understand all the instructions given to me and received a copy. I do not have any more questions at this time, but understand that I may call the Specialty Clinic at [facility] at [phone] during business hours should I have any further questions or need assistance in obtaining follow-up care.    __________________________________________  _____________  __________ Signature of Patient or Authorized Representative            Date                   Time    __________________________________________ Nurse's Signature

## 2011-04-18 NOTE — Progress Notes (Signed)
This office note has been dictated.

## 2011-04-18 NOTE — Progress Notes (Signed)
CC:   [name]. Loleta Chance, MD Zenovia Jordan, MD Lionel December, M.D.  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  Eileen Santana is a very complicated woman who is 78 years old, being seen by Dr. Karilyn Cota, Dr. Loleta Chance, Dr. Nickola Major and myself.  She has still a profound anemia with a hemoglobin of 8.8 g, platelets are still mildly low intermittently at 125,000, though 3 weeks ago they were 149,000.  Her white count is 9900, and her differential 3 weeks ago really was mildly left-shifted.  Her sedimentation rate is now only 35 but she is on prednisone 10 mg a day.  Her reticulocyte count was still brisk at 179,000 but we have not suspected that she was hemolyzing.  She has had other blood counts early in the year that showed a platelet count of 84,000, hemoglobin 8.1 g, white count 7500 so I suspect the white count is partially elevated now due to the steroids but her platelets are better, her hemoglobin is still not responding, however.  Her reticulocytes in September were very low actually at a time when her sedimentation rate was 125.  She had only a 2% reticulocyte count, but absolute reticulocyte count of only 76,000.  I think since starting the steroids with a drop in her sedimentation rate she has had a rebound in her bone marrow function.  So she is here today for followup.  She does not feel a ton better but we have checked her haptoglobin in the past which was perfectly unremarkable, not indicating hemolysis at that time and that was last year in June when her haptoglobin level was 231.  Her sedimentation rate has been as high as 132, and then with the onset and use of steroids her sedimentation rate has dropped into the high 20s/low 30s.  She still feels weak and tired and so I think it is reasonable  to consider Aranesp at this point since her sedimentation rate is much lower than it was.  Usually when we see the people with inflammatory disorders and the high sedimentation rates they do not seem to respond well in my experience to darbepoetin or erythropoietin.  So we will give this a try for 12 weeks.  She and her daughter are willing to proceed with this and we will see what happens if anything to her hemoglobin.    ______________________________ Ladona Horns. Mariel Sleet, MD ESN/MEDQ  D:  04/18/2011  T:  04/18/2011  Job:  [id number]

## 2011-04-20 ENCOUNTER — Encounter (HOSPITAL_BASED_OUTPATIENT_CLINIC_OR_DEPARTMENT_OTHER): Payer: Medicare Other

## 2011-04-20 DIAGNOSIS — D649 Anemia, unspecified: Secondary | ICD-10-CM

## 2011-04-20 MED ORDER — DARBEPOETIN ALFA-POLYSORBATE 500 MCG/ML IJ SOLN
500.0000 ug | Freq: Once | INTRAMUSCULAR | Status: AC
  Administered 2011-04-20: 500 ug via SUBCUTANEOUS

## 2011-04-20 MED ORDER — DARBEPOETIN ALFA-POLYSORBATE 500 MCG/ML IJ SOLN
INTRAMUSCULAR | Status: AC
  Filled 2011-04-20: qty 1

## 2011-04-20 NOTE — Progress Notes (Signed)
Ardeth Perfect Aldridge presents today for injection per MD orders. Aranesp administered SQ in left Abdomen. Administration without incident. Patient tolerated well.

## 2011-04-22 ENCOUNTER — Telehealth (INDEPENDENT_AMBULATORY_CARE_PROVIDER_SITE_OTHER): Payer: Self-pay | Admitting: *Deleted

## 2011-04-22 MED ORDER — DEXLANSOPRAZOLE 60 MG PO CPDR
60.0000 mg | DELAYED_RELEASE_CAPSULE | Freq: Every day | ORAL | Status: DC
Start: 2011-04-22 — End: 2011-08-23

## 2011-04-22 NOTE — Telephone Encounter (Signed)
[facility] has requested a refill request on Dexilant 60 mg, take 1 capsule by mouth 30 minutes prior to breakfast

## 2011-04-26 ENCOUNTER — Ambulatory Visit (INDEPENDENT_AMBULATORY_CARE_PROVIDER_SITE_OTHER): Payer: Medicare Other | Admitting: Internal Medicine

## 2011-04-26 ENCOUNTER — Encounter (INDEPENDENT_AMBULATORY_CARE_PROVIDER_SITE_OTHER): Payer: Self-pay | Admitting: Internal Medicine

## 2011-04-26 DIAGNOSIS — K5900 Constipation, unspecified: Secondary | ICD-10-CM

## 2011-04-26 DIAGNOSIS — K754 Autoimmune hepatitis: Secondary | ICD-10-CM

## 2011-04-26 DIAGNOSIS — Z5189 Encounter for other specified aftercare: Secondary | ICD-10-CM

## 2011-04-26 DIAGNOSIS — D61818 Other pancytopenia: Secondary | ICD-10-CM

## 2011-04-26 DIAGNOSIS — R109 Unspecified abdominal pain: Secondary | ICD-10-CM

## 2011-04-26 DIAGNOSIS — Z79899 Other long term (current) drug therapy: Secondary | ICD-10-CM

## 2011-04-26 DIAGNOSIS — R188 Other ascites: Secondary | ICD-10-CM

## 2011-04-26 MED ORDER — DOCUSATE SODIUM 100 MG PO CAPS: 100.0000 mg | ORAL_CAPSULE | Freq: Two times a day (BID) | ORAL | Status: AC

## 2011-04-26 MED ORDER — SPIRONOLACTONE 50 MG PO TABS: 50.0000 mg | ORAL_TABLET | Freq: Two times a day (BID) | ORAL | Status: DC

## 2011-04-26 MED ORDER — HYDROCODONE-ACETAMINOPHEN 5-500 MG PO TABS: 2.0000 | ORAL_TABLET | Freq: Three times a day (TID) | ORAL | Status: DC | PRN

## 2011-04-26 MED ORDER — PREDNISONE 10 MG PO TABS: 5.0000 mg | ORAL_TABLET | Freq: Every day | ORAL | Status: DC

## 2011-04-26 MED ORDER — FUROSEMIDE 20 MG PO TABS: 20.0000 mg | ORAL_TABLET | Freq: Every day | ORAL | Status: DC

## 2011-04-26 NOTE — Patient Instructions (Addendum)
Next blood work would be on 05/13/2011. Notify if abdominal pain is recurrent.

## 2011-04-26 NOTE — Progress Notes (Signed)
Presenting complaint;  Followup for liver problems.   Database;  Patient is a 78 year old Afro-American female who is in for scheduled visit accompanied by her daughter [name]. She was initially seen in her office on 02/17/2011 by Ms. Dorene Ar NP for elevated transaminases. No focal abnormalities are noted on abdominopelvic CT as well as ultrasound. Markers for hepatitis B and C. were negative. Smooth muscle antibody was positive at a titer of 48 U (normal less than 20). ACE level was borderline at 57 U/L. ANA.in  September, 2012 was negative and rheumatoid factor was positive at 225 IU/mL. She was already on prednisone for rheumatoid arthritis  and the care of Dr. Zenovia Jordan of [place]. She also has been evaluated by Dr. 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 She had a liver biopsy on 03/15/2011. Typical changes of autoimmune hepatitis were not seen. Dr. Frederica Kuster obtained secondhand from hepatologist at the [facility]. Systolic she was felt to be most consistent with venous outflow obstruction. She therefore returned for hepatobiliary ultrasound along with Doppler study and hepatic veins were wide open. For progressive cholestasis she was begun on Urso. Her TPMT was normal in prescription for azathioprine was called over 2 weeks ago but unfortunately this was not filled until yesterday. Patient has been gradually decreasing her prednisone and she took 5 mg yesterday. She is also seeing Dr. Fausto Skillern. Subjective; Patient states she feels some better. Her main complaint is one of feeling tired and weak.; however she is unable to do simple tasks at home. She has had a good appetite until today. She noted nausea last evening and this morning but without emesis. She has noted decrease in abdominal swelling and lower extremity edema as she's been dropping prednisone dose. Her glucose levels have also been coming down. Today she noted abdominal pain primarily on  the left side but migratory in other quadrants. Her stool has been somewhat hard. She denies melena or rectal bleeding or dysuria. She has not experienced weight loss since her last visit.          Current Medications: Current Outpatient Prescriptions on File Prior to Visit  Medication Sig Dispense Refill  . alendronate (FOSAMAX) 70 MG tablet Take 70 mg by mouth every 7 (seven) days. Take with a full glass of water on an empty stomach.Patient takes on Saturday      . ALPRAZolam (XANAX) 0.25 MG tablet Take 0.25 mg by mouth at bedtime as needed.      Marland Kitchen aspirin 81 MG tablet Take 81 mg by mouth daily.      . Bisacodyl (DULCOLAX PO) Take by mouth as needed.      . Coenzyme Q10 (CO Q 10) 100 MG CAPS Take 200 mg by mouth daily.      Marland Kitchen dexlansoprazole (DEXILANT) 60 MG capsule Take 1 capsule (60 mg total) by mouth daily.  30 capsule  6  . fish oil-omega-3 fatty acids 1000 MG capsule Take 1 g by mouth daily.      . Hydrocod Polst-Chlorphen Polst (TUSSIONEX PENNKINETIC ER PO) Take 5 mLs by mouth as needed.      . loratadine (CLARITIN) 10 MG tablet Take 10 mg by mouth as needed.      . Metoprolol Tartrate (LOPRESSOR PO) Take 12.5 mg by mouth 2 (two) times daily.      . Multiple Vitamins-Minerals (MULTIVITAMIN WITH MINERALS) tablet Take 1 tablet by mouth daily.        . ursodiol (ACTIGALL) 250 MG tablet  Take 2 tablets (500 mg total) by mouth 3 (three) times daily.  120 tablet  5  . DISCONTD: furosemide (LASIX) 20 MG tablet Take 20 mg by mouth daily.      Marland Kitchen azaTHIOprine (IMURAN) 50 MG tablet Take 1.5 tablets (75 mg total) by mouth daily.  45 tablet  2  . insulin aspart (NOVOLOG) 100 UNIT/ML injection Inject 4 Units into the skin 3 (three) times daily before meals.  10 mL  3     Objective: Blood pressure 110/70, pulse 98, temperature 98.4 F (36.9 C), temperature source Oral, resp. rate 20, height 5\' 5"  (1.651 m), weight 151 lb 14.4 oz (68.901 kg). Patient appears chronically ill and has a round  facies. Conjunctiva is pink. Sclera is nonicteric Oropharyngeal mucosa is normal. No neck masses or thyromegaly noted. Cardiac exam with regular rhythm normal S1 and S2. No murmur or gallop noted. Lungs are clear to auscultation. Abdomen is symmetrical. Bowel sounds are normal. Abdomen is soft with mild tenderness in left lower quadrants. No guarding or rebound noted. No hepatosplenomegaly or masses.  She has trace edema around her ankles. No clubbing noted  Labs/studies Results: From 04/18/2011. WBC 9.9, hemoglobin 8.8, hematocrit 28.8 MCV 69.1, platelet count 125K. Serum sodium 134 potassium 4.6 chloride 97 CO2 26 glucose 226 BUN 24 creatinine 1.35, calcium 10.3, Bilirubin 4.1, AP 176, AST 129 ALT 58, albumin 2.4 Gamma GT 98; it was 583 one month ago.     Assessment:  #1. Patient's liver disease appears to be both  cholestatic and hepatocellular injury. Etiology is felt to be autoimmune. Liver biopsy suggestive of venoocclusive disease however she has patent hepatic veins on Doppler study. She has had somewhat delayed response to prednisone. She will need to be on azathioprine her long-term maintenance on this she has side effects. #2. Microcytic anemia previously well documented to be due to iron deficiency. Need for parenteral iron   would be deferred to Dr. Mariel Sleet. #3. Recent onset of left-sided abdominal pain. Abdominal exam is rather benign. Will monitor.   Plan: Patient or family member when called if abdominal pain persists. Continue prednisone at 5 mg by mouth every morning. Start azathioprine 75 mg by mouth daily. Colace 2 tablets by mouth each bedtime. Continue furosemide, Spironolactone and Urso at current dose. New prescription given for furosemide, spironolactone and  Prednisone given. Patient will have CBC with differential and comprehensive chemistry panel on 05/13/2011. Next office visit in 4 weeks.

## 2011-05-04 ENCOUNTER — Telehealth (INDEPENDENT_AMBULATORY_CARE_PROVIDER_SITE_OTHER): Payer: Self-pay | Admitting: *Deleted

## 2011-05-04 ENCOUNTER — Encounter (INDEPENDENT_AMBULATORY_CARE_PROVIDER_SITE_OTHER): Payer: Self-pay | Admitting: *Deleted

## 2011-05-04 DIAGNOSIS — Z79899 Other long term (current) drug therapy: Secondary | ICD-10-CM

## 2011-05-04 NOTE — Telephone Encounter (Signed)
Lab printed 

## 2011-05-09 ENCOUNTER — Other Ambulatory Visit (INDEPENDENT_AMBULATORY_CARE_PROVIDER_SITE_OTHER): Payer: Self-pay | Admitting: *Deleted

## 2011-05-09 DIAGNOSIS — K754 Autoimmune hepatitis: Secondary | ICD-10-CM

## 2011-05-10 ENCOUNTER — Other Ambulatory Visit (HOSPITAL_COMMUNITY): Payer: Self-pay

## 2011-05-10 DIAGNOSIS — R700 Elevated erythrocyte sedimentation rate: Secondary | ICD-10-CM

## 2011-05-12 ENCOUNTER — Encounter (HOSPITAL_COMMUNITY): Payer: Medicare Other | Attending: Oncology

## 2011-05-12 DIAGNOSIS — D649 Anemia, unspecified: Secondary | ICD-10-CM | POA: Insufficient documentation

## 2011-05-12 DIAGNOSIS — N19 Unspecified kidney failure: Secondary | ICD-10-CM | POA: Insufficient documentation

## 2011-05-12 DIAGNOSIS — R700 Elevated erythrocyte sedimentation rate: Secondary | ICD-10-CM | POA: Insufficient documentation

## 2011-05-12 DIAGNOSIS — D61818 Other pancytopenia: Secondary | ICD-10-CM | POA: Insufficient documentation

## 2011-05-12 LAB — CBC
Hemoglobin: 6.5 g/dL — CL (ref 12.0–15.0)
RBC: 3.32 MIL/uL — ABNORMAL LOW (ref 3.87–5.11)

## 2011-05-12 LAB — SEDIMENTATION RATE: Sed Rate: 65 mm/hr — ABNORMAL HIGH (ref 0–22)

## 2011-05-12 MED ORDER — DARBEPOETIN ALFA-POLYSORBATE 500 MCG/ML IJ SOLN
500.0000 ug | Freq: Once | INTRAMUSCULAR | Status: AC
  Administered 2011-05-12: 500 ug via SUBCUTANEOUS

## 2011-05-12 MED ORDER — DARBEPOETIN ALFA-POLYSORBATE 500 MCG/ML IJ SOLN
INTRAMUSCULAR | Status: AC
  Filled 2011-05-12: qty 1

## 2011-05-12 NOTE — Progress Notes (Signed)
Eileen Santana presented for labwork. Labs per MD order drawn via Peripheral Line 23 gauge needle inserted in right antecubital.  Good blood return present. Procedure without incident.  Needle removed intact. Patient tolerated procedure well.  Eileen Santana presents today for injection per MD orders. Aranesp 500 mcg administered SQ in left Abdomen. Administration without incident. Patient tolerated well.  Patient scheduled to receive 2 units PRBC tomorrow.

## 2011-05-13 ENCOUNTER — Encounter (HOSPITAL_BASED_OUTPATIENT_CLINIC_OR_DEPARTMENT_OTHER): Payer: Medicare Other

## 2011-05-13 VITALS — BP 122/70 | HR 102 | Temp 98.6°F | Resp 18

## 2011-05-13 DIAGNOSIS — D649 Anemia, unspecified: Secondary | ICD-10-CM

## 2011-05-13 LAB — COMPREHENSIVE METABOLIC PANEL
ALT: 19 U/L (ref 0–35)
AST: 78 U/L — ABNORMAL HIGH (ref 0–37)
Albumin: 2.5 g/dL — ABNORMAL LOW (ref 3.5–5.2)
Calcium: 9.6 mg/dL (ref 8.4–10.5)
Chloride: 105 mEq/L (ref 96–112)
Potassium: 4.3 mEq/L (ref 3.5–5.3)
Sodium: 136 mEq/L (ref 135–145)

## 2011-05-13 LAB — C-REACTIVE PROTEIN: CRP: 1.64 mg/dL — ABNORMAL HIGH (ref <0.60)

## 2011-05-13 MED ORDER — ACETAMINOPHEN 325 MG PO TABS
ORAL_TABLET | ORAL | Status: AC
  Filled 2011-05-13: qty 2

## 2011-05-13 MED ORDER — DIPHENHYDRAMINE HCL 25 MG PO CAPS
ORAL_CAPSULE | ORAL | Status: AC
  Filled 2011-05-13: qty 1

## 2011-05-13 MED ORDER — ACETAMINOPHEN 325 MG PO TABS
650.0000 mg | ORAL_TABLET | Freq: Four times a day (QID) | ORAL | Status: DC | PRN
  Administered 2011-05-13: 650 mg via ORAL

## 2011-05-13 MED ORDER — SODIUM CHLORIDE 0.9 % IJ SOLN
INTRAMUSCULAR | Status: AC
  Filled 2011-05-13: qty 10

## 2011-05-13 MED ORDER — SODIUM CHLORIDE 0.9 % IV SOLN
250.0000 mL | Freq: Once | INTRAVENOUS | Status: AC
  Administered 2011-05-13: 250 mL via INTRAVENOUS

## 2011-05-13 MED ORDER — SODIUM CHLORIDE 0.9 % IJ SOLN
10.0000 mL | INTRAMUSCULAR | Status: AC | PRN
  Administered 2011-05-13: 10 mL
  Filled 2011-05-13: qty 10

## 2011-05-13 MED ORDER — DIPHENHYDRAMINE HCL 25 MG PO CAPS
25.0000 mg | ORAL_CAPSULE | Freq: Four times a day (QID) | ORAL | Status: AC | PRN
  Administered 2011-05-13: 25 mg via ORAL

## 2011-05-13 NOTE — Progress Notes (Signed)
Tolerated packed red cell transfusion well. Home accompanied by family members.  No s/s of  Transfusion reaction.

## 2011-05-14 LAB — TYPE AND SCREEN: Unit division: 0

## 2011-05-23 ENCOUNTER — Encounter (INDEPENDENT_AMBULATORY_CARE_PROVIDER_SITE_OTHER): Payer: Self-pay | Admitting: Internal Medicine

## 2011-05-23 ENCOUNTER — Ambulatory Visit (INDEPENDENT_AMBULATORY_CARE_PROVIDER_SITE_OTHER): Payer: Medicare Other | Admitting: Internal Medicine

## 2011-05-23 VITALS — BP 128/78 | HR 76 | Temp 98.4°F | Resp 20 | Ht 66.0 in | Wt 156.9 lb

## 2011-05-23 DIAGNOSIS — K754 Autoimmune hepatitis: Secondary | ICD-10-CM

## 2011-05-23 DIAGNOSIS — D649 Anemia, unspecified: Secondary | ICD-10-CM

## 2011-05-23 NOTE — Patient Instructions (Addendum)
Notify if lower extremity edema increases or you  have gained more than 5 pounds. Next blood work to be done on 06/02/2011. To continue prednisone 5 mg daily and Imuran/azathioprine 75 mg daily

## 2011-05-23 NOTE — Progress Notes (Signed)
Presenting complaint;  Followup for autoimmune hepatitis and cholangitis.  Subjective:  Patient is a 78 year old female who is here for scheduled visit accompanied by one of her daughters. She was last seen 4 weeks ago she has been on Imuran for 4 weeks now. She she reports no side effects with this medication. Patient states that since she received blood transfusion 3 weeks ago she feels great. Her appetite is is normal. Her strength is coming back and she is able to move around much better. She has noted lower extremity edema slightly more on the right side. She denies shortness of breath or leg pain. She's also felt to have rheumatoid arthritis and leukocytoclastic vasculitis and is seeing Dr. Zenovia Jordan. Abdominal pain that she was complaining off on her last visit has resolved. Her bowels move every morning and she denies melena or rectal bleeding.  Current Medications: Current Outpatient Prescriptions  Medication Sig Dispense Refill  . alendronate (FOSAMAX) 70 MG tablet Take 70 mg by mouth every 7 (seven) days. Take with a full glass of water on an empty stomach.Patient takes on Saturday      . ALPRAZolam (XANAX) 0.25 MG tablet Take 0.25 mg by mouth at bedtime as needed.      Marland Kitchen aspirin 81 MG tablet Take 81 mg by mouth daily.      Marland Kitchen azaTHIOprine (IMURAN) 50 MG tablet Take 1.5 tablets (75 mg total) by mouth daily.  45 tablet  2  . Bisacodyl (DULCOLAX PO) Take by mouth as needed.      . Coenzyme Q10 (CO Q 10) 100 MG CAPS Take 200 mg by mouth daily.      Marland Kitchen dexlansoprazole (DEXILANT) 60 MG capsule Take 1 capsule (60 mg total) by mouth daily.  30 capsule  6  . docusate sodium (COLACE) 100 MG capsule Take 1 capsule (100 mg total) by mouth 2 (two) times daily.  60 capsule  2  . fish oil-omega-3 fatty acids 1000 MG capsule Take 1 g by mouth daily.      . furosemide (LASIX) 20 MG tablet Take 1 tablet (20 mg total) by mouth daily.  30 tablet  2  . Hydrocod Polst-Chlorphen Polst (TUSSIONEX  PENNKINETIC ER PO) Take 5 mLs by mouth as needed.      . insulin aspart (NOVOLOG) 100 UNIT/ML injection Inject 4 Units into the skin 3 (three) times daily before meals.  10 mL  3  . loratadine (CLARITIN) 10 MG tablet Take 10 mg by mouth as needed.      . Metoprolol Tartrate (LOPRESSOR PO) Take 12.5 mg by mouth 2 (two) times daily.      . Multiple Vitamins-Minerals (MULTIVITAMIN WITH MINERALS) tablet Take 1 tablet by mouth daily.        . predniSONE (DELTASONE) 10 MG tablet Take 0.5 tablets (5 mg total) by mouth daily. On 10 mg now will decrease to 5 mg tomorrow  30 tablet  1  . spironolactone (ALDACTONE) 50 MG tablet Take 1 tablet (50 mg total) by mouth 2 (two) times daily.  60 tablet  2  . ursodiol (ACTIGALL) 250 MG tablet Take 2 tablets (500 mg total) by mouth 3 (three) times daily.  120 tablet  5   No current facility-administered medications for this visit.   Facility-Administered Medications Ordered in Other Visits  Medication Dose Route Frequency Provider Last Rate Last Dose  . acetaminophen (TYLENOL) tablet 650 mg  650 mg Oral Q6H PRN Randall An, MD   650 mg  at 05/13/11 0953  . diphenhydrAMINE (BENADRYL) capsule 25 mg  25 mg Oral Q6H PRN Randall An, MD   25 mg at 05/13/11 1610     Objective: Blood pressure 128/78, pulse 76, temperature 98.4 F (36.9 C), temperature source Oral, resp. rate 20, height 5\' 6"  (1.676 m), weight 156 lb 14.4 oz (71.169 kg). Patient is comfortable sitting in chair. She has round facies. Conjunctiva is pink. Sclera is nonicteric Oropharyngeal mucosa is normal. No neck masses or thyromegaly noted. Cardiac exam with regular rhythm normal S1 and S2. No murmur or gallop noted. Lungs are clear to auscultation. Abdomen is full, soft and nontender without organomegaly or masses.  She has bilateral lower extremity edema 2+ on the right side and 1+ on the left below the level of the knees. No calf tenderness noted.  Labs/studies Results: LFTs from  05/04/2011. Total bilirubin 1.8, AP 98, AST 78, ALT 19 and albumin 2.5 .    Assessment:  #1. Autoimmune hepatitis and cholangitis based on patient's liver biopsy and biochemical parameters prednisone is down to 5 mg per day and she has been on Imuran for 4 weeks now and tolerating this medication. There has been significant improvement in level of transaminases and cholestasis has resolved. #2. Anemia. Felt to be multifactorial requiring 2 units of PRBCs a few weeks ago. #3. Other autoimmune disorders as above. Current therapy should help with these as well.   Plan: Patient will continue prednisone and Imuran at current dose. She will have CBC, C-reactive protein and comprehensive chemistry panel on 06/02/2011. Office visit in 3 months

## 2011-05-25 ENCOUNTER — Other Ambulatory Visit (INDEPENDENT_AMBULATORY_CARE_PROVIDER_SITE_OTHER): Payer: Self-pay | Admitting: *Deleted

## 2011-05-25 DIAGNOSIS — K754 Autoimmune hepatitis: Secondary | ICD-10-CM

## 2011-05-25 DIAGNOSIS — D649 Anemia, unspecified: Secondary | ICD-10-CM

## 2011-06-02 ENCOUNTER — Encounter (HOSPITAL_BASED_OUTPATIENT_CLINIC_OR_DEPARTMENT_OTHER): Payer: Medicare Other

## 2011-06-02 ENCOUNTER — Encounter (INDEPENDENT_AMBULATORY_CARE_PROVIDER_SITE_OTHER): Payer: Self-pay

## 2011-06-02 VITALS — BP 109/52 | HR 93

## 2011-06-02 DIAGNOSIS — D649 Anemia, unspecified: Secondary | ICD-10-CM

## 2011-06-02 DIAGNOSIS — N19 Unspecified kidney failure: Secondary | ICD-10-CM

## 2011-06-02 MED ORDER — DARBEPOETIN ALFA-POLYSORBATE 500 MCG/ML IJ SOLN
INTRAMUSCULAR | Status: AC
  Filled 2011-06-02: qty 1

## 2011-06-02 MED ORDER — DARBEPOETIN ALFA-POLYSORBATE 500 MCG/ML IJ SOLN
500.0000 ug | Freq: Once | INTRAMUSCULAR | Status: AC
  Administered 2011-06-02: 500 ug via SUBCUTANEOUS

## 2011-06-02 NOTE — Progress Notes (Signed)
Eileen Santana presents today for injection per MD orders. Aranesp 500 mcg administered SQ in right Abdomen. Administration without incident. Patient tolerated well.  

## 2011-06-21 ENCOUNTER — Encounter (HOSPITAL_COMMUNITY): Payer: Medicare Other | Attending: Oncology

## 2011-06-21 ENCOUNTER — Encounter (HOSPITAL_BASED_OUTPATIENT_CLINIC_OR_DEPARTMENT_OTHER): Payer: Medicare Other | Admitting: Oncology

## 2011-06-21 ENCOUNTER — Other Ambulatory Visit (HOSPITAL_COMMUNITY): Payer: Self-pay | Admitting: Oncology

## 2011-06-21 DIAGNOSIS — N19 Unspecified kidney failure: Secondary | ICD-10-CM

## 2011-06-21 DIAGNOSIS — D649 Anemia, unspecified: Secondary | ICD-10-CM

## 2011-06-21 DIAGNOSIS — M069 Rheumatoid arthritis, unspecified: Secondary | ICD-10-CM

## 2011-06-21 DIAGNOSIS — M359 Systemic involvement of connective tissue, unspecified: Secondary | ICD-10-CM

## 2011-06-21 LAB — DIFFERENTIAL
Basophils Absolute: 0.1 10*3/uL (ref 0.0–0.1)
Eosinophils Absolute: 0.1 10*3/uL (ref 0.0–0.7)
Lymphocytes Relative: 15 % (ref 12–46)
Monocytes Absolute: 0.8 10*3/uL (ref 0.1–1.0)
Neutro Abs: 3.9 10*3/uL (ref 1.7–7.7)

## 2011-06-21 LAB — COMPREHENSIVE METABOLIC PANEL
Albumin: 2.9 g/dL — ABNORMAL LOW (ref 3.5–5.2)
BUN: 23 mg/dL (ref 6–23)
Creatinine, Ser: 1.06 mg/dL (ref 0.50–1.10)
Total Protein: 6.8 g/dL (ref 6.0–8.3)

## 2011-06-21 LAB — CBC
Hemoglobin: 5.7 g/dL — CL (ref 12.0–15.0)
MCHC: 28.9 g/dL — ABNORMAL LOW (ref 30.0–36.0)
RDW: 21.2 % — ABNORMAL HIGH (ref 11.5–15.5)

## 2011-06-21 LAB — SEDIMENTATION RATE: Sed Rate: 75 mm/hr — ABNORMAL HIGH (ref 0–22)

## 2011-06-21 LAB — LACTATE DEHYDROGENASE: LDH: 320 U/L — ABNORMAL HIGH (ref 94–250)

## 2011-06-21 LAB — RETICULOCYTES
RBC.: 3 MIL/uL — ABNORMAL LOW (ref 3.87–5.11)
Retic Count, Absolute: 48 10*3/uL (ref 19.0–186.0)

## 2011-06-21 MED ORDER — DARBEPOETIN ALFA-POLYSORBATE 500 MCG/ML IJ SOLN
INTRAMUSCULAR | Status: AC
  Filled 2011-06-21: qty 1

## 2011-06-21 MED ORDER — DARBEPOETIN ALFA-POLYSORBATE 500 MCG/ML IJ SOLN
500.0000 ug | Freq: Once | INTRAMUSCULAR | Status: AC
  Administered 2011-06-21: 500 ug via SUBCUTANEOUS

## 2011-06-21 NOTE — Progress Notes (Signed)
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 She also has severe anemia which thus far is not responding to Aranesp 500 mcg every 21 days. Her hemoglobin today is under 6 down from just under 9 we started therapy. She is very weak very tired but her liver enzymes have improved on the Imuran 75 mg is her present dose. She is a work in today. Her daughter [name] is with her today and the patient is very weak and tired and having a hard time getting around her house. We are going to transfuse her with 2 units of packed cells and reevaluate a number of blood tests. Her daughter will call us on Friday to see if the blood has helped her strength and I will be in touch after the blood tests with Dr. Karilyn Cota to see if we need to do anything else. 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 Sometimes these autoimmune disorders suppress the bone marrow significantly such that erythropoietin agents do not always work.

## 2011-06-21 NOTE — Progress Notes (Signed)
Eileen Santana presents today for injection per MD orders. Aranesp 500 mcg administered SQ in right Abdomen. Administration without incident. Patient tolerated well.  Eileen Santana presented for labwork. Labs per MD order drawn via Peripheral Line 25 gauge needle inserted in rt arm.  Good blood return present. Procedure without incident.  Needle removed intact. Patient tolerated procedure well. Dr. Mariel Sleet in to talk with pt and orders received.

## 2011-06-22 ENCOUNTER — Encounter (HOSPITAL_BASED_OUTPATIENT_CLINIC_OR_DEPARTMENT_OTHER): Payer: Medicare Other

## 2011-06-22 VITALS — BP 122/67 | HR 87 | Temp 98.4°F | Resp 18

## 2011-06-22 DIAGNOSIS — D649 Anemia, unspecified: Secondary | ICD-10-CM

## 2011-06-22 LAB — HAPTOGLOBIN: Haptoglobin: 127 mg/dL (ref 45–215)

## 2011-06-22 LAB — IRON AND TIBC

## 2011-06-22 LAB — VITAMIN B12: Vitamin B-12: 703 pg/mL (ref 211–911)

## 2011-06-22 LAB — FERRITIN: Ferritin: 16 ng/mL (ref 10–291)

## 2011-06-22 MED ORDER — ACETAMINOPHEN 325 MG PO TABS
650.0000 mg | ORAL_TABLET | Freq: Once | ORAL | Status: AC
  Administered 2011-06-22: 650 mg via ORAL

## 2011-06-22 MED ORDER — SODIUM CHLORIDE 0.9 % IJ SOLN
10.0000 mL | INTRAMUSCULAR | Status: DC | PRN
  Filled 2011-06-22: qty 10

## 2011-06-22 MED ORDER — ACETAMINOPHEN 325 MG PO TABS
ORAL_TABLET | ORAL | Status: AC
  Filled 2011-06-22: qty 2

## 2011-06-22 MED ORDER — SODIUM CHLORIDE 0.9 % IV SOLN
250.0000 mL | Freq: Once | INTRAVENOUS | Status: AC
  Administered 2011-06-22: 250 mL via INTRAVENOUS

## 2011-06-22 MED ORDER — DIPHENHYDRAMINE HCL 25 MG PO CAPS
ORAL_CAPSULE | ORAL | Status: AC
  Filled 2011-06-22: qty 1

## 2011-06-22 MED ORDER — DIPHENHYDRAMINE HCL 25 MG PO CAPS
25.0000 mg | ORAL_CAPSULE | Freq: Once | ORAL | Status: AC
  Administered 2011-06-22: 25 mg via ORAL
  Filled 2011-06-22: qty 1

## 2011-06-22 NOTE — Patient Instructions (Addendum)
[facility]  Discharge Instructions  RECOMMENDATIONS MADE BY THE CONSULTANT AND ANY TEST RESULTS WILL BE SENT TO YOUR REFERRING DOCTOR.   EXAM FINDINGS BY MD TODAY AND SIGNS AND SYMPTOMS TO REPORT TO CLINIC OR PRIMARY MD:  Dr. Mariel Sleet and Dr. Karilyn Cota talked and would like you to increase your prednisone to 10 mg daily.  SPECIAL INSTRUCTIONS/FOLLOW-UP: Lab work Needed in 2 weeks See Dr. Mariel Sleet the end of July   I acknowledge that I have been informed and understand all the instructions given to me and received a copy. I do not have any more questions at this time, but understand that I may call the Specialty Clinic at [facility] at [phone] during business hours should I have any further questions or need assistance in obtaining follow-up care.

## 2011-06-22 NOTE — Progress Notes (Signed)
Tolerated well

## 2011-06-23 LAB — TYPE AND SCREEN
Antibody Screen: NEGATIVE
Unit division: 0

## 2011-06-27 ENCOUNTER — Other Ambulatory Visit (HOSPITAL_COMMUNITY): Payer: Self-pay | Admitting: Oncology

## 2011-06-27 DIAGNOSIS — E611 Iron deficiency: Secondary | ICD-10-CM

## 2011-06-27 MED ORDER — SODIUM CHLORIDE 0.9 % IV SOLN: 1020.0000 mg | Freq: Once | INTRAVENOUS | Status: DC

## 2011-06-28 ENCOUNTER — Encounter (HOSPITAL_BASED_OUTPATIENT_CLINIC_OR_DEPARTMENT_OTHER): Payer: Medicare Other

## 2011-06-28 DIAGNOSIS — D649 Anemia, unspecified: Secondary | ICD-10-CM

## 2011-06-28 MED ORDER — SODIUM CHLORIDE 0.9 % IJ SOLN
INTRAMUSCULAR | Status: AC
  Filled 2011-06-28: qty 10

## 2011-06-28 MED ORDER — SODIUM CHLORIDE 0.9 % IV SOLN
1020.0000 mg | Freq: Once | INTRAVENOUS | Status: AC
  Administered 2011-06-28: 1020 mg via INTRAVENOUS
  Filled 2011-06-28: qty 34

## 2011-06-28 MED ORDER — SODIUM CHLORIDE 0.9 % IV SOLN
Freq: Once | INTRAVENOUS | Status: AC
  Administered 2011-06-28: 15:00:00 via INTRAVENOUS

## 2011-06-28 MED ORDER — SODIUM CHLORIDE 0.9 % IJ SOLN
10.0000 mL | INTRAMUSCULAR | Status: DC | PRN
  Administered 2011-06-28: 10 mL via INTRAVENOUS
  Filled 2011-06-28: qty 10

## 2011-06-28 NOTE — Progress Notes (Signed)
Tolerated fereheme infusion well. 

## 2011-07-04 ENCOUNTER — Encounter (HOSPITAL_COMMUNITY): Payer: Medicare Other | Attending: Oncology

## 2011-07-04 DIAGNOSIS — R7401 Elevation of levels of liver transaminase levels: Secondary | ICD-10-CM | POA: Insufficient documentation

## 2011-07-04 DIAGNOSIS — D649 Anemia, unspecified: Secondary | ICD-10-CM

## 2011-07-04 DIAGNOSIS — N19 Unspecified kidney failure: Secondary | ICD-10-CM | POA: Insufficient documentation

## 2011-07-04 DIAGNOSIS — R7402 Elevation of levels of lactic acid dehydrogenase (LDH): Secondary | ICD-10-CM | POA: Insufficient documentation

## 2011-07-04 LAB — CBC
Hemoglobin: 10.2 g/dL — ABNORMAL LOW (ref 12.0–15.0)
MCH: 23.7 pg — ABNORMAL LOW (ref 26.0–34.0)
MCHC: 30.9 g/dL (ref 30.0–36.0)
Platelets: 176 10*3/uL (ref 150–400)
RDW: 30.8 % — ABNORMAL HIGH (ref 11.5–15.5)

## 2011-07-04 NOTE — Progress Notes (Signed)
Lab draw

## 2011-07-05 ENCOUNTER — Other Ambulatory Visit (HOSPITAL_COMMUNITY): Payer: Medicare Other

## 2011-07-12 ENCOUNTER — Other Ambulatory Visit (HOSPITAL_COMMUNITY): Payer: Medicare Other

## 2011-07-12 ENCOUNTER — Encounter (HOSPITAL_BASED_OUTPATIENT_CLINIC_OR_DEPARTMENT_OTHER): Payer: Medicare Other

## 2011-07-12 ENCOUNTER — Ambulatory Visit (HOSPITAL_COMMUNITY): Payer: Medicare Other | Admitting: Oncology

## 2011-07-12 VITALS — BP 118/75 | HR 94

## 2011-07-12 DIAGNOSIS — D649 Anemia, unspecified: Secondary | ICD-10-CM

## 2011-07-12 MED ORDER — DARBEPOETIN ALFA-POLYSORBATE 500 MCG/ML IJ SOLN
500.0000 ug | Freq: Once | INTRAMUSCULAR | Status: AC
  Administered 2011-07-12: 500 ug via SUBCUTANEOUS

## 2011-07-12 MED ORDER — DARBEPOETIN ALFA-POLYSORBATE 500 MCG/ML IJ SOLN
INTRAMUSCULAR | Status: AC
  Filled 2011-07-12: qty 1

## 2011-07-12 NOTE — Progress Notes (Signed)
Tolerated injection well. 

## 2011-07-26 ENCOUNTER — Encounter (HOSPITAL_BASED_OUTPATIENT_CLINIC_OR_DEPARTMENT_OTHER): Payer: Medicare Other

## 2011-07-26 DIAGNOSIS — D649 Anemia, unspecified: Secondary | ICD-10-CM

## 2011-07-26 LAB — CBC
MCH: 24.8 pg — ABNORMAL LOW (ref 26.0–34.0)
MCV: 77.3 fL — ABNORMAL LOW (ref 78.0–100.0)
Platelets: 166 10*3/uL (ref 150–400)
RDW: 25 % — ABNORMAL HIGH (ref 11.5–15.5)
WBC: 7.2 10*3/uL (ref 4.0–10.5)

## 2011-07-26 NOTE — Progress Notes (Signed)
Labs drawn today for cbc 

## 2011-07-27 ENCOUNTER — Other Ambulatory Visit (HOSPITAL_COMMUNITY): Payer: Self-pay

## 2011-07-27 DIAGNOSIS — D649 Anemia, unspecified: Secondary | ICD-10-CM

## 2011-08-02 ENCOUNTER — Encounter (HOSPITAL_BASED_OUTPATIENT_CLINIC_OR_DEPARTMENT_OTHER): Payer: Medicare Other | Admitting: Oncology

## 2011-08-02 ENCOUNTER — Telehealth (INDEPENDENT_AMBULATORY_CARE_PROVIDER_SITE_OTHER): Payer: Self-pay | Admitting: *Deleted

## 2011-08-02 ENCOUNTER — Other Ambulatory Visit (INDEPENDENT_AMBULATORY_CARE_PROVIDER_SITE_OTHER): Payer: Self-pay | Admitting: Internal Medicine

## 2011-08-02 ENCOUNTER — Encounter (HOSPITAL_COMMUNITY): Payer: Self-pay | Admitting: Oncology

## 2011-08-02 VITALS — BP 105/63 | HR 91 | Temp 98.3°F | Wt 156.2 lb

## 2011-08-02 DIAGNOSIS — D649 Anemia, unspecified: Secondary | ICD-10-CM

## 2011-08-02 DIAGNOSIS — M359 Systemic involvement of connective tissue, unspecified: Secondary | ICD-10-CM

## 2011-08-02 DIAGNOSIS — R748 Abnormal levels of other serum enzymes: Secondary | ICD-10-CM

## 2011-08-02 DIAGNOSIS — K7689 Other specified diseases of liver: Secondary | ICD-10-CM

## 2011-08-02 LAB — CBC WITH DIFFERENTIAL/PLATELET
Basophils Relative: 0 % (ref 0–1)
Eosinophils Relative: 1 % (ref 0–5)
Hemoglobin: 7.5 g/dL — ABNORMAL LOW (ref 12.0–15.0)
MCH: 23.7 pg — ABNORMAL LOW (ref 26.0–34.0)
MCV: 74.8 fL — ABNORMAL LOW (ref 78.0–100.0)
Monocytes Absolute: 0.4 10*3/uL (ref 0.1–1.0)
Monocytes Relative: 5 % (ref 3–12)
Neutrophils Relative %: 87 % — ABNORMAL HIGH (ref 43–77)
RBC: 3.17 MIL/uL — ABNORMAL LOW (ref 3.87–5.11)
WBC: 7.4 10*3/uL (ref 4.0–10.5)

## 2011-08-02 LAB — RETICULOCYTES
RBC.: 3.17 MIL/uL — ABNORMAL LOW (ref 3.87–5.11)
Retic Count, Absolute: 85.6 10*3/uL (ref 19.0–186.0)
Retic Ct Pct: 2.7 % (ref 0.4–3.1)

## 2011-08-02 LAB — COMPREHENSIVE METABOLIC PANEL
BUN: 17 mg/dL (ref 6–23)
Calcium: 10.4 mg/dL (ref 8.4–10.5)
Creatinine, Ser: 0.98 mg/dL (ref 0.50–1.10)
GFR calc Af Amer: 62 mL/min — ABNORMAL LOW (ref >90)
GFR calc non Af Amer: 54 mL/min — ABNORMAL LOW (ref >90)
Glucose, Bld: 138 mg/dL — ABNORMAL HIGH (ref 70–99)
Sodium: 136 mEq/L (ref 135–145)
Total Protein: 6.2 g/dL (ref 6.0–8.3)

## 2011-08-02 MED ORDER — DARBEPOETIN ALFA-POLYSORBATE 500 MCG/ML IJ SOLN
500.0000 ug | Freq: Once | INTRAMUSCULAR | Status: AC
  Administered 2011-08-02: 500 ug via SUBCUTANEOUS

## 2011-08-02 MED ORDER — URSODIOL 250 MG PO TABS: 500.0000 mg | ORAL_TABLET | Freq: Three times a day (TID) | ORAL | Status: DC

## 2011-08-02 MED ORDER — DARBEPOETIN ALFA-POLYSORBATE 500 MCG/ML IJ SOLN
INTRAMUSCULAR | Status: AC
  Filled 2011-08-02: qty 1

## 2011-08-02 NOTE — Progress Notes (Signed)
Eileen Santana presented for labwork. Labs per MD order drawn via Peripheral Line 23 gauge needle inserted in right forearm. Good blood return present. Procedure without incident.  Needle removed intact. Patient tolerated procedure well. Eileen Santana presents today for injection per MD orders. Aranesp 500 mcg administered SQ in right Abdomen. Administration without incident. Patient tolerated well.

## 2011-08-02 NOTE — Telephone Encounter (Signed)
rec'd refill request:  Ursodiol 250 mg #120 take 2 tab po tid

## 2011-08-02 NOTE — Patient Instructions (Signed)
Eileen Santana  [id number] Jul 20, 1930 Dr. Glenford Peers  [facility]  Discharge Instructions  RECOMMENDATIONS MADE BY THE CONSULTANT AND ANY TEST RESULTS WILL BE SENT TO YOUR REFERRING DOCTOR.   Lab work today and every 3 weeks. Aranesp today and every 3 weeks as needed. Return to clinic in 2 months to see MD.   I acknowledge that I have been informed and understand all the instructions given to me and received a copy. I do not have any more questions at this time, but understand that I may call the [facility] at [facility] at [phone] during business hours should I have any further questions or need assistance in obtaining follow-up care.    __________________________________________  _____________  __________ Signature of Patient or Authorized Representative            Date                   Time    __________________________________________ Nurse's Signature

## 2011-08-02 NOTE — Progress Notes (Signed)
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 She presented with pancytopenia but presently her white count and platelets have returned to normal but her hemoglobin remains an issue. She also developed iron deficiency and has been replaced with IV feraheme as of 06/28/2011. She received 1020 mg on that day. She was also transfused with 2 units of packed cells when her hemoglobin dropped to 5.7. Her hemoglobin is now 8.2. She was feeling stronger and much better when her hemoglobin was 10 g or greater but is starting to get slightly weak again. She has intact proximal muscle strength and walk to the floor today without the help of a wheelchair. She did not use a cane either. She definitely looks better. I think her sedimentation rate needs to be repeated and possibly her prednisone increased mildly since her bone marrow will not respond to Aranesp if her sed rate is too high. I will also start checking every three-week blood counts liver enzymes etc. so that Dr. Karilyn Cota and Dr. Nickola Major and myself can all be in the loop as to her progress. Her daughter will call me in the morning. We will see her in 8 weeks.

## 2011-08-03 ENCOUNTER — Telehealth (HOSPITAL_COMMUNITY): Payer: Self-pay

## 2011-08-03 ENCOUNTER — Other Ambulatory Visit (HOSPITAL_COMMUNITY): Payer: Self-pay | Admitting: Oncology

## 2011-08-03 ENCOUNTER — Other Ambulatory Visit (HOSPITAL_COMMUNITY): Payer: Self-pay

## 2011-08-03 DIAGNOSIS — D649 Anemia, unspecified: Secondary | ICD-10-CM

## 2011-08-03 LAB — FERRITIN: Ferritin: 74 ng/mL (ref 10–291)

## 2011-08-03 LAB — IRON AND TIBC
Iron: 40 ug/dL — ABNORMAL LOW (ref 42–135)
Saturation Ratios: 11 % — ABNORMAL LOW (ref 20–55)
TIBC: 360 ug/dL (ref 250–470)

## 2011-08-03 NOTE — Telephone Encounter (Signed)
Message copied by Evelena Leyden on Wed Aug 03, 2011  2:54 PM ------      Message from: Mariel Sleet, ERIC S      Created: Wed Aug 03, 2011 10:39 AM       Is she on aranesp? If not we can start 500 q 3 weeks

## 2011-08-03 NOTE — Telephone Encounter (Signed)
Call back from [place] states "Mom wants to go ahead and get the transfusion.  Says that her legs are weak and she does have some dyspnea."  Scheduled transfusion for 8/2.  Patient will come tomorrow to be typed and cross - matched and transfusion will be done on 8/2.

## 2011-08-03 NOTE — Telephone Encounter (Signed)
Notified Eileen Santana that her mother's sed rate was down and that prednisone did not need to be increased and if she was symptomatic that we could give her 2 units of blood.  Per Eileen Santana, her mother was not symptomatic and she did not want to do anything at the present.  Instructed to call if symptoms change.

## 2011-08-04 ENCOUNTER — Encounter (HOSPITAL_COMMUNITY): Payer: Medicare Other | Attending: Oncology

## 2011-08-04 DIAGNOSIS — R7401 Elevation of levels of liver transaminase levels: Secondary | ICD-10-CM | POA: Insufficient documentation

## 2011-08-04 DIAGNOSIS — R7402 Elevation of levels of lactic acid dehydrogenase (LDH): Secondary | ICD-10-CM | POA: Insufficient documentation

## 2011-08-04 DIAGNOSIS — D649 Anemia, unspecified: Secondary | ICD-10-CM | POA: Insufficient documentation

## 2011-08-04 NOTE — Progress Notes (Signed)
Labs drawn today for type and screen 

## 2011-08-05 ENCOUNTER — Encounter (HOSPITAL_BASED_OUTPATIENT_CLINIC_OR_DEPARTMENT_OTHER): Payer: Medicare Other

## 2011-08-05 VITALS — BP 111/65 | HR 88 | Temp 98.5°F | Resp 18

## 2011-08-05 DIAGNOSIS — D649 Anemia, unspecified: Secondary | ICD-10-CM

## 2011-08-05 MED ORDER — ACETAMINOPHEN 325 MG PO TABS
650.0000 mg | ORAL_TABLET | Freq: Once | ORAL | Status: AC
  Administered 2011-08-05: 650 mg via ORAL

## 2011-08-05 MED ORDER — ACETAMINOPHEN 325 MG PO TABS
ORAL_TABLET | ORAL | Status: AC
  Filled 2011-08-05: qty 2

## 2011-08-05 MED ORDER — DIPHENHYDRAMINE HCL 25 MG PO CAPS
25.0000 mg | ORAL_CAPSULE | Freq: Once | ORAL | Status: AC
  Administered 2011-08-05: 25 mg via ORAL

## 2011-08-05 MED ORDER — SODIUM CHLORIDE 0.9 % IJ SOLN
10.0000 mL | INTRAMUSCULAR | Status: AC | PRN
  Administered 2011-08-05: 10 mL
  Filled 2011-08-05: qty 10

## 2011-08-05 MED ORDER — SODIUM CHLORIDE 0.9 % IV SOLN
250.0000 mL | Freq: Once | INTRAVENOUS | Status: AC
Start: 2011-08-05 — End: 2011-08-05
  Administered 2011-08-05: 250 mL via INTRAVENOUS

## 2011-08-05 MED ORDER — DIPHENHYDRAMINE HCL 25 MG PO CAPS
ORAL_CAPSULE | ORAL | Status: AC
  Filled 2011-08-05: qty 1

## 2011-08-05 MED ORDER — SODIUM CHLORIDE 0.9 % IJ SOLN
INTRAMUSCULAR | Status: AC
  Filled 2011-08-05: qty 10

## 2011-08-05 NOTE — Progress Notes (Signed)
Tolerated transfusion without problems 

## 2011-08-06 LAB — TYPE AND SCREEN
Antibody Screen: NEGATIVE
Unit division: 0

## 2011-08-10 ENCOUNTER — Emergency Department (HOSPITAL_COMMUNITY): Payer: Medicare Other

## 2011-08-10 ENCOUNTER — Encounter (HOSPITAL_COMMUNITY): Payer: Self-pay | Admitting: Emergency Medicine

## 2011-08-10 ENCOUNTER — Inpatient Hospital Stay (HOSPITAL_COMMUNITY)
Admission: EM | Admit: 2011-08-10 | Discharge: 2011-08-12 | DRG: 690 | Disposition: A | Discharge status: Home / Self Care | Payer: Medicare Other | Attending: Internal Medicine | Admitting: Internal Medicine

## 2011-08-10 DIAGNOSIS — T380X5A Adverse effect of glucocorticoids and synthetic analogues, initial encounter: Secondary | ICD-10-CM | POA: Diagnosis present

## 2011-08-10 DIAGNOSIS — D649 Anemia, unspecified: Secondary | ICD-10-CM

## 2011-08-10 DIAGNOSIS — F17200 Nicotine dependence, unspecified, uncomplicated: Secondary | ICD-10-CM | POA: Diagnosis present

## 2011-08-10 DIAGNOSIS — G722 Myopathy due to other toxic agents: Secondary | ICD-10-CM | POA: Diagnosis present

## 2011-08-10 DIAGNOSIS — Z882 Allergy status to sulfonamides status: Secondary | ICD-10-CM

## 2011-08-10 DIAGNOSIS — R748 Abnormal levels of other serum enzymes: Secondary | ICD-10-CM | POA: Diagnosis present

## 2011-08-10 DIAGNOSIS — R9431 Abnormal electrocardiogram [ECG] [EKG]: Secondary | ICD-10-CM | POA: Diagnosis present

## 2011-08-10 DIAGNOSIS — R7401 Elevation of levels of liver transaminase levels: Secondary | ICD-10-CM | POA: Diagnosis present

## 2011-08-10 DIAGNOSIS — D509 Iron deficiency anemia, unspecified: Secondary | ICD-10-CM

## 2011-08-10 DIAGNOSIS — Z885 Allergy status to narcotic agent status: Secondary | ICD-10-CM

## 2011-08-10 DIAGNOSIS — R110 Nausea: Secondary | ICD-10-CM | POA: Diagnosis present

## 2011-08-10 DIAGNOSIS — D696 Thrombocytopenia, unspecified: Secondary | ICD-10-CM

## 2011-08-10 DIAGNOSIS — E861 Hypovolemia: Secondary | ICD-10-CM | POA: Diagnosis present

## 2011-08-10 DIAGNOSIS — Z6825 Body mass index (BMI) 25.0-25.9, adult: Secondary | ICD-10-CM

## 2011-08-10 DIAGNOSIS — N390 Urinary tract infection, site not specified: Principal | ICD-10-CM

## 2011-08-10 DIAGNOSIS — N200 Calculus of kidney: Secondary | ICD-10-CM | POA: Diagnosis present

## 2011-08-10 DIAGNOSIS — Z91041 Radiographic dye allergy status: Secondary | ICD-10-CM

## 2011-08-10 DIAGNOSIS — I10 Essential (primary) hypertension: Secondary | ICD-10-CM

## 2011-08-10 DIAGNOSIS — R1084 Generalized abdominal pain: Secondary | ICD-10-CM

## 2011-08-10 DIAGNOSIS — Z79899 Other long term (current) drug therapy: Secondary | ICD-10-CM

## 2011-08-10 DIAGNOSIS — R7402 Elevation of levels of lactic acid dehydrogenase (LDH): Secondary | ICD-10-CM | POA: Diagnosis present

## 2011-08-10 DIAGNOSIS — K754 Autoimmune hepatitis: Secondary | ICD-10-CM

## 2011-08-10 DIAGNOSIS — R011 Cardiac murmur, unspecified: Secondary | ICD-10-CM | POA: Diagnosis present

## 2011-08-10 DIAGNOSIS — B9689 Other specified bacterial agents as the cause of diseases classified elsewhere: Secondary | ICD-10-CM | POA: Diagnosis present

## 2011-08-10 DIAGNOSIS — R001 Bradycardia, unspecified: Secondary | ICD-10-CM

## 2011-08-10 DIAGNOSIS — R700 Elevated erythrocyte sedimentation rate: Secondary | ICD-10-CM

## 2011-08-10 DIAGNOSIS — M1990 Unspecified osteoarthritis, unspecified site: Secondary | ICD-10-CM | POA: Diagnosis present

## 2011-08-10 DIAGNOSIS — R197 Diarrhea, unspecified: Secondary | ICD-10-CM

## 2011-08-10 DIAGNOSIS — N19 Unspecified kidney failure: Secondary | ICD-10-CM

## 2011-08-10 DIAGNOSIS — E669 Obesity, unspecified: Secondary | ICD-10-CM | POA: Diagnosis present

## 2011-08-10 DIAGNOSIS — Z7982 Long term (current) use of aspirin: Secondary | ICD-10-CM

## 2011-08-10 DIAGNOSIS — IMO0002 Reserved for concepts with insufficient information to code with codable children: Secondary | ICD-10-CM

## 2011-08-10 DIAGNOSIS — K922 Gastrointestinal hemorrhage, unspecified: Secondary | ICD-10-CM

## 2011-08-10 DIAGNOSIS — K8020 Calculus of gallbladder without cholecystitis without obstruction: Secondary | ICD-10-CM

## 2011-08-10 DIAGNOSIS — E860 Dehydration: Secondary | ICD-10-CM

## 2011-08-10 DIAGNOSIS — E876 Hypokalemia: Secondary | ICD-10-CM | POA: Diagnosis present

## 2011-08-10 DIAGNOSIS — I951 Orthostatic hypotension: Secondary | ICD-10-CM

## 2011-08-10 HISTORY — DX: Calculus of kidney: N20.0

## 2011-08-10 LAB — CBC WITH DIFFERENTIAL/PLATELET
Basophils Relative: 0 % (ref 0–1)
Eosinophils Absolute: 0 10*3/uL (ref 0.0–0.7)
HCT: 28.2 % — ABNORMAL LOW (ref 36.0–46.0)
Hemoglobin: 9 g/dL — ABNORMAL LOW (ref 12.0–15.0)
MCH: 25.1 pg — ABNORMAL LOW (ref 26.0–34.0)
MCHC: 31.9 g/dL (ref 30.0–36.0)
MCV: 78.6 fL (ref 78.0–100.0)
Monocytes Absolute: 0.4 10*3/uL (ref 0.1–1.0)
Monocytes Relative: 3 % (ref 3–12)
Neutro Abs: 11.6 10*3/uL — ABNORMAL HIGH (ref 1.7–7.7)

## 2011-08-10 LAB — URINE MICROSCOPIC-ADD ON

## 2011-08-10 LAB — COMPREHENSIVE METABOLIC PANEL
ALT: 14 U/L (ref 0–35)
AST: 46 U/L — ABNORMAL HIGH (ref 0–37)
Albumin: 2.5 g/dL — ABNORMAL LOW (ref 3.5–5.2)
Calcium: 9.6 mg/dL (ref 8.4–10.5)
Sodium: 137 mEq/L (ref 135–145)
Total Protein: 5.1 g/dL — ABNORMAL LOW (ref 6.0–8.3)

## 2011-08-10 LAB — URINALYSIS, ROUTINE W REFLEX MICROSCOPIC
Glucose, UA: NEGATIVE mg/dL
Ketones, ur: NEGATIVE mg/dL
pH: 6 (ref 5.0–8.0)

## 2011-08-10 LAB — CLOSTRIDIUM DIFFICILE BY PCR: Toxigenic C. Difficile by PCR: NEGATIVE

## 2011-08-10 MED ORDER — HYDROCORTISONE SOD SUCCINATE 100 MG IJ SOLR
50.0000 mg | Freq: Once | INTRAMUSCULAR | Status: AC
  Administered 2011-08-10: 50 mg via INTRAVENOUS
  Filled 2011-08-10: qty 2

## 2011-08-10 MED ORDER — POTASSIUM CHLORIDE 20 MEQ/15ML (10%) PO LIQD
40.0000 meq | Freq: Once | ORAL | Status: AC
  Administered 2011-08-10: 40 meq via ORAL
  Filled 2011-08-10: qty 30

## 2011-08-10 MED ORDER — ALUM & MAG HYDROXIDE-SIMETH 200-200-20 MG/5ML PO SUSP
30.0000 mL | Freq: Four times a day (QID) | ORAL | Status: DC | PRN
Start: 2011-08-10 — End: 2011-08-12

## 2011-08-10 MED ORDER — ALPRAZOLAM 0.25 MG PO TABS: 0.2500 mg | ORAL_TABLET | Freq: Four times a day (QID) | ORAL | Status: DC | PRN

## 2011-08-10 MED ORDER — ACETAMINOPHEN 325 MG PO TABS: 650.0000 mg | ORAL_TABLET | Freq: Four times a day (QID) | ORAL | Status: DC | PRN

## 2011-08-10 MED ORDER — ACETAMINOPHEN 650 MG RE SUPP: 650.0000 mg | Freq: Four times a day (QID) | RECTAL | Status: DC | PRN

## 2011-08-10 MED ORDER — METRONIDAZOLE IN NACL 5-0.79 MG/ML-% IV SOLN
500.0000 mg | Freq: Three times a day (TID) | INTRAVENOUS | Status: DC
  Administered 2011-08-10 – 2011-08-11 (×3): 500 mg via INTRAVENOUS
  Filled 2011-08-10 (×6): qty 100

## 2011-08-10 MED ORDER — ONDANSETRON HCL 4 MG/2ML IJ SOLN
4.0000 mg | INTRAMUSCULAR | Status: DC | PRN
  Administered 2011-08-10: 4 mg via INTRAVENOUS
  Filled 2011-08-10 (×2): qty 2

## 2011-08-10 MED ORDER — ONDANSETRON HCL 4 MG/2ML IJ SOLN: 4.0000 mg | Freq: Four times a day (QID) | INTRAMUSCULAR | Status: DC | PRN

## 2011-08-10 MED ORDER — AZATHIOPRINE 50 MG PO TABS
75.0000 mg | ORAL_TABLET | Freq: Every day | ORAL | Status: DC
  Administered 2011-08-10 – 2011-08-12 (×3): 75 mg via ORAL
  Filled 2011-08-10 (×3): qty 2

## 2011-08-10 MED ORDER — POTASSIUM CHLORIDE IN NACL 40-0.9 MEQ/L-% IV SOLN
INTRAVENOUS | Status: DC
  Administered 2011-08-10: 75 mL/h via INTRAVENOUS
  Filled 2011-08-10 (×4): qty 1000

## 2011-08-10 MED ORDER — DICLOFENAC SODIUM 1 % TD GEL
1.0000 "application " | Freq: Four times a day (QID) | TRANSDERMAL | Status: DC
  Administered 2011-08-10 – 2011-08-11 (×2): 1 via TOPICAL
  Filled 2011-08-10: qty 100

## 2011-08-10 MED ORDER — ALBUTEROL SULFATE (5 MG/ML) 0.5% IN NEBU: 2.5000 mg | INHALATION_SOLUTION | RESPIRATORY_TRACT | Status: DC | PRN

## 2011-08-10 MED ORDER — MORPHINE SULFATE 4 MG/ML IJ SOLN
4.0000 mg | INTRAMUSCULAR | Status: DC | PRN
  Administered 2011-08-11 (×2): 4 mg via INTRAVENOUS
  Filled 2011-08-10 (×2): qty 1

## 2011-08-10 MED ORDER — SPIRONOLACTONE 25 MG PO TABS
50.0000 mg | ORAL_TABLET | Freq: Every day | ORAL | Status: DC
  Administered 2011-08-10 – 2011-08-12 (×3): 50 mg via ORAL
  Filled 2011-08-10: qty 2
  Filled 2011-08-10 (×2): qty 1
  Filled 2011-08-10: qty 2
  Filled 2011-08-10: qty 1
  Filled 2011-08-10: qty 2

## 2011-08-10 MED ORDER — PANTOPRAZOLE SODIUM 40 MG PO TBEC
40.0000 mg | DELAYED_RELEASE_TABLET | Freq: Every day | ORAL | Status: DC
  Administered 2011-08-11: 40 mg via ORAL
  Filled 2011-08-10: qty 1

## 2011-08-10 MED ORDER — URSODIOL 500 MG PO TABS
500.0000 mg | ORAL_TABLET | Freq: Two times a day (BID) | ORAL | Status: DC
  Administered 2011-08-10 – 2011-08-11 (×3): 500 mg via ORAL
  Filled 2011-08-10 (×4): qty 1

## 2011-08-10 MED ORDER — DEXTROSE 5 % IV SOLN
1.0000 g | Freq: Once | INTRAVENOUS | Status: AC
  Administered 2011-08-10: 1 g via INTRAVENOUS
  Filled 2011-08-10: qty 10

## 2011-08-10 MED ORDER — FAMOTIDINE IN NACL 20-0.9 MG/50ML-% IV SOLN
20.0000 mg | Freq: Once | INTRAVENOUS | Status: AC
  Administered 2011-08-10: 20 mg via INTRAVENOUS
  Filled 2011-08-10: qty 50

## 2011-08-10 MED ORDER — PREDNISONE 10 MG PO TABS
20.0000 mg | ORAL_TABLET | Freq: Every day | ORAL | Status: DC
  Administered 2011-08-11 – 2011-08-12 (×2): 20 mg via ORAL
  Filled 2011-08-10 (×2): qty 2

## 2011-08-10 MED ORDER — SODIUM CHLORIDE 0.9 % IV SOLN
INTRAVENOUS | Status: DC
  Administered 2011-08-10: 1000 mL via INTRAVENOUS

## 2011-08-10 MED ORDER — MAGNESIUM SULFATE 40 MG/ML IJ SOLN
2.0000 g | Freq: Once | INTRAMUSCULAR | Status: AC
  Administered 2011-08-10: 2 g via INTRAVENOUS
  Filled 2011-08-10: qty 50

## 2011-08-10 MED ORDER — CIPROFLOXACIN IN D5W 400 MG/200ML IV SOLN
400.0000 mg | Freq: Two times a day (BID) | INTRAVENOUS | Status: DC
  Administered 2011-08-10 – 2011-08-11 (×2): 400 mg via INTRAVENOUS
  Filled 2011-08-10 (×5): qty 200

## 2011-08-10 MED ORDER — ONDANSETRON HCL 4 MG PO TABS: 4.0000 mg | ORAL_TABLET | Freq: Four times a day (QID) | ORAL | Status: DC | PRN

## 2011-08-10 MED ORDER — ASPIRIN EC 81 MG PO TBEC
81.0000 mg | DELAYED_RELEASE_TABLET | Freq: Every day | ORAL | Status: DC
  Administered 2011-08-10 – 2011-08-12 (×3): 81 mg via ORAL
  Filled 2011-08-10 (×3): qty 1

## 2011-08-10 NOTE — ED Notes (Signed)
Pt not ambulated d/t being orthostatic.

## 2011-08-10 NOTE — ED Provider Notes (Signed)
History  This chart was scribed for Laray Anger, DO by Bennett Scrape. This patient was seen in room [facility]04 and the patient's care was started at 9:23AM.  CSN: [id number]  Arrival date & time 08/10/11  0918   First MD Initiated Contact with Patient 08/10/11 [phone]      Chief Complaint  Patient presents with  . Nausea  . Diarrhea    The history is provided by the patient. No language interpreter was used.    Pt was seen at 9:34AM.  MATRICE HERRO is a 78 y.o. female who presents to the Emergency Department complaining of gradual onset and worsening of constant generalized abd "pain" since approx 0600 PTA.  Has been associated with nausea and multiple episodes of "diarrhea."  Describes her stools as "watery."  Denies recent abx use.  Denies vomiting, no fevers, no back pain, no black or blood in stools, no rash, no CP/SOB.     Dr. Loleta Chance is PCP. Pt has also been seen by Dr. Mariel Sleet, Dr. Stefano Gaul, Dr. Juanetta Gosling and Dr. Jeanie Cooks since the onset of the GI bleed.  Past Medical History  Diagnosis Date  . H/O: GI bleed d/t NSAIDS  . Anemia     fe def anemia  . DJD (degenerative joint disease)   . Hypertension   . Colitis, acute hx of   . Ileitis hx of  . Pancytopenia 09/03/2010  . Sedimentation rate elevation 09/03/2010  . Autoimmune hepatitis   . Heart block AV second degree March 2013  . Bronchitis     Past Surgical History  Procedure Date  . Colonoscopy   . Upper gastrointestinal endoscopy     Family History  Problem Relation Age of Onset  . Stroke Mother   . Hypertension Sister   . Hypertension Brother     History  Substance Use Topics  . Smoking status: Current Some Day Smoker    Types: Cigarettes  . Smokeless tobacco: Never Used  . Alcohol Use: No    No OB history provided.  Review of Systems ROS: Statement: All systems negative except as marked or noted in the HPI; Constitutional: Negative for fever and chills. ; ; Eyes: Negative for eye pain,  redness and discharge. ; ; ENMT: Negative for ear pain, hoarseness, nasal congestion, sinus pressure and sore throat. ; ; Cardiovascular: Negative for chest pain, palpitations, diaphoresis, dyspnea and peripheral edema. ; ; Respiratory: Negative for cough, wheezing and stridor. ; ; Gastrointestinal: +nausea, abd pain, diarrhea.  Negative for vomiting, blood in stool, hematemesis, jaundice and rectal bleeding. . ; ; Genitourinary: Negative for dysuria, flank pain and hematuria. ; ; Musculoskeletal: Negative for back pain and neck pain. Negative for swelling and trauma.; ; Skin: Negative for pruritus, rash, abrasions, blisters, bruising and skin lesion.; ; Neuro: Negative for headache, lightheadedness and neck stiffness. Negative for weakness, altered level of consciousness , altered mental status, extremity weakness, paresthesias, involuntary movement, seizure and syncope.       Allergies  Iohexol; Ivp dye; Naprosyn; Red blood cells; Verapamil; Vicodin; and Sulfa antibiotics  Home Medications   Current Outpatient Rx  Name Route Sig Dispense Refill  . ALENDRONATE SODIUM 70 MG PO TABS Oral Take 70 mg by mouth every 7 (seven) days. Take with a full glass of water on an empty stomach.Patient takes on Saturday    . ALPRAZOLAM 0.25 MG PO TABS Oral Take 0.25 mg by mouth at bedtime as needed.    . ASPIRIN 81 MG PO TABS Oral  Take 81 mg by mouth daily.    . AZATHIOPRINE 50 MG PO TABS Oral Take 1.5 tablets (75 mg total) by mouth daily. 45 tablet 2  . DULCOLAX PO Oral Take by mouth as needed.    . CO Q 10 100 MG PO CAPS Oral Take 200 mg by mouth daily.    . DEXLANSOPRAZOLE 60 MG PO CPDR Oral Take 1 capsule (60 mg total) by mouth daily. 30 capsule 6  . DOCUSATE SODIUM 100 MG PO CAPS Oral Take 1 capsule (100 mg total) by mouth 2 (two) times daily. 60 capsule 2  . OMEGA-3 FATTY ACIDS 1000 MG PO CAPS Oral Take 1 g by mouth daily.    . FUROSEMIDE 20 MG PO TABS Oral Take 20 mg by mouth. Taking as needed    .  TUSSIONEX PENNKINETIC ER PO Oral Take 5 mLs by mouth as needed.    Marland Kitchen LORATADINE 10 MG PO TABS Oral Take 10 mg by mouth as needed.    Marland Kitchen LOPRESSOR PO Oral Take 12.5 mg by mouth 2 (two) times daily.    . MULTI-VITAMIN/MINERALS PO TABS Oral Take 1 tablet by mouth daily.      Marland Kitchen PREDNISONE 10 MG PO TABS Oral Take 10 mg by mouth daily.    Marland Kitchen SPIRONOLACTONE 50 MG PO TABS Oral Take 50 mg by mouth daily.    Marland Kitchen URSODIOL 250 MG PO TABS Oral Take 2 tablets (500 mg total) by mouth 3 (three) times daily. 120 tablet 5  . URSODIOL 250 MG PO TABS Oral Take 500 mg by mouth 2 (two) times daily.      Triage Vitals :BP 118/51  Temp 98.9 F (37.2 C) (Oral)  Ht 5\' 6"  (1.676 m)  Wt 156 lb (70.761 kg)  BMI 25.18 kg/m2  SpO2 94%  Physical Exam 0940: Physical examination:  Nursing notes reviewed; Vital signs and O2 SAT reviewed;  Constitutional: Well developed, Well nourished, In no acute distress; Head:  Normocephalic, atraumatic; Eyes: EOMI, PERRL, No scleral icterus; ENMT: Mouth and pharynx normal, Mucous membranes dry; Neck: Supple, Full range of motion, No lymphadenopathy; Cardiovascular: Irregular rate and rhythm, No gallop; Respiratory: Breath sounds clear & equal bilaterally, No wheezes.  Speaking full sentences with ease, Normal respiratory effort/excursion; Chest: Nontender, Movement normal; Abdomen: Soft, +mild diffuse tenderness to palp.  No rebound or guarding. Nondistended, Normal bowel sounds, Rectal exam performed w/permission of pt and ED RN chaparone present.  Anal tone normal.  Non-tender, soft brown stool in rectal vault, heme positive.  No fissures, no external hemorrhoids, no palp masses.; Extremities: Pulses normal, No tenderness, No edema, No calf edema or asymmetry.; Neuro: AA&Ox3, Major CN grossly intact.  Speech clear. No gross focal motor or sensory deficits in extremities.; Skin: Color normal, Warm, Dry.   ED Course  Procedures   MDM  MDM Reviewed: previous chart, nursing note and  vitals Reviewed previous: labs and ECG Interpretation: labs, ECG, x-ray and CT scan    Date: 08/10/2011  Rate: 125  Rhythm: atrial fibrillation, artifact  QRS Axis: normal  Intervals: QT prolonged  ST/T Wave abnormalities: nonspecific ST/T changes  Conduction Disutrbances:none  Narrative Interpretation:   Old EKG Reviewed: changes noted, afib and prolonged QT new since previous EKG dated 03/06/2011.  Results for orders placed during the hospital encounter of 08/10/11  COMPREHENSIVE METABOLIC PANEL      Component Value Range   Sodium 137  135 - 145 mEq/L   Potassium 3.1 (*) 3.5 - 5.1 mEq/L  Chloride 102  96 - 112 mEq/L   CO2 21  19 - 32 mEq/L   Glucose, Bld 108 (*) 70 - 99 mg/dL   BUN 18  6 - 23 mg/dL   Creatinine, Ser 1.61  0.50 - 1.10 mg/dL   Calcium 9.6  8.4 - 09.6 mg/dL   Total Protein 5.1 (*) 6.0 - 8.3 g/dL   Albumin 2.5 (*) 3.5 - 5.2 g/dL   AST 46 (*) 0 - 37 U/L   ALT 14  0 - 35 U/L   Alkaline Phosphatase 61  39 - 117 U/L   Total Bilirubin 1.0  0.3 - 1.2 mg/dL   GFR calc non Af Amer 56 (*) >90 mL/min   GFR calc Af Amer 65 (*) >90 mL/min  CBC WITH DIFFERENTIAL      Component Value Range   WBC 12.3 (*) 4.0 - 10.5 K/uL   RBC 3.59 (*) 3.87 - 5.11 MIL/uL   Hemoglobin 9.0 (*) 12.0 - 15.0 g/dL   HCT 04.5 (*) 40.9 - 81.1 %   MCV 78.6  78.0 - 100.0 fL   MCH 25.1 (*) 26.0 - 34.0 pg   MCHC 31.9  30.0 - 36.0 g/dL   RDW 91.4 (*) 78.2 - 95.6 %   Platelets 121 (*) 150 - 400 K/uL   Neutrophils Relative 94 (*) 43 - 77 %   Neutro Abs 11.6 (*) 1.7 - 7.7 K/uL   Lymphocytes Relative 3 (*) 12 - 46 %   Lymphs Abs 0.3 (*) 0.7 - 4.0 K/uL   Monocytes Relative 3  3 - 12 %   Monocytes Absolute 0.4  0.1 - 1.0 K/uL   Eosinophils Relative 0  0 - 5 %   Eosinophils Absolute 0.0  0.0 - 0.7 K/uL   Basophils Relative 0  0 - 1 %   Basophils Absolute 0.0  0.0 - 0.1 K/uL  LIPASE, BLOOD      Component Value Range   Lipase 47  11 - 59 U/L  TROPONIN I      Component Value Range   Troponin I  <0.30  <0.30 ng/mL  URINALYSIS, ROUTINE W REFLEX MICROSCOPIC      Component Value Range   Color, Urine YELLOW  YELLOW   APPearance CLEAR  CLEAR   Specific Gravity, Urine 1.010  1.005 - 1.030   pH 6.0  5.0 - 8.0   Glucose, UA NEGATIVE  NEGATIVE mg/dL   Hgb urine dipstick SMALL (*) NEGATIVE   Bilirubin Urine SMALL (*) NEGATIVE   Ketones, ur NEGATIVE  NEGATIVE mg/dL   Protein, ur NEGATIVE  NEGATIVE mg/dL   Urobilinogen, UA 0.2  0.0 - 1.0 mg/dL   Nitrite POSITIVE (*) NEGATIVE   Leukocytes, UA SMALL (*) NEGATIVE  MAGNESIUM      Component Value Range   Magnesium 1.1 (*) 1.5 - 2.5 mg/dL  OCCULT BLOOD, POC DEVICE      Component Value Range   Fecal Occult Bld POSITIVE    URINE MICROSCOPIC-ADD ON      Component Value Range   Squamous Epithelial / LPF FEW (*) RARE   WBC, UA TOO NUMEROUS TO COUNT  <3 WBC/hpf   RBC / HPF 0-2  <3 RBC/hpf   Bacteria, UA MANY (*) RARE   Ct Abdomen Pelvis Wo Contrast 08/10/2011  *RADIOLOGY REPORT*  Clinical Data: Abdominal pain, nausea and bloody diarrhea.  CT ABDOMEN AND PELVIS WITHOUT CONTRAST  Technique:  Multidetector CT imaging of the abdomen and pelvis was performed  following the standard protocol without intravenous contrast.  Comparison: 09/09/2010  Findings: The lung bases appear clear.  No pericardial or pleural effusion.  There is no focal liver abnormality.  Tiny stones are noted layering within the gallbladder fundus.  Gallbladder wall thickening/edema is again identified as seen on ultrasound from 03/17/2011.  There is no significant biliary dilatation.  Normal appearance of the pancreas.  The spleen is normal.  Both adrenal glands are within normal limits.  There is a tiny cyst within the lower pole of the right kidney.  This is incompletely characterized without IV contrast.  No right-sided hydronephrosis or nephrolithiasis.  Left renal cyst measures 3.4 mm.  There is a nonobstructing calculus within the mid pole of the left kidney measuring 3 mm, image  number 33.  Urinary bladder appears normal. The uterus and the adnexal structures have a normal physiologic appearance for patient's age.  No pathologic adenopathy within the upper abdomen.  There is no pelvic or inguinal adenopathy.  The stomach is normal.  The small bowel loops are unremarkable.  The appendix is identified and appears normal.  Normal appearance of the colon.  No wall thickening or inflammatory change noted.  No obstructing mass identified.  Review of the visualized osseous structures is significant for lumbar degenerative disc disease.  Most severe at L4-5 and L5-S1.  IMPRESSION:  1.  Tiny stones are suspected within the fundus of the gallbladder. There is gallbladder wall edema.  In the setting of liver disease this may be a nonspecific finding.  If there is a concern for acute cholecystitis a nuclear medicine hepatobiliary scan may be helpful to assess the patency of the cystic duct. 2.  Nonobstructing left renal calculus. 3.  Bilateral renal cysts.  These are incompletely characterized without IV contrast.  Original Report Authenticated By: Rosealee Albee, M.D.   Dg Chest Port 1 View 08/10/2011  *RADIOLOGY REPORT*  Clinical Data: Nausea, abdominal pain.  PORTABLE CHEST - 1 VIEW  Comparison: 03/05/2011  Findings: Cardiomegaly.  The lungs are clear.  No effusions.  No acute bony abnormality.  IMPRESSION: Cardiomegaly.  No active disease.  Original Report Authenticated By: Cyndie Chime, M.D.    Results for LEAANN, NEVILS (MRN [id number]) as of 08/10/2011 13:41  Ref. Range 07/26/2011 10:19 08/02/2011 15:30 08/10/2011 10:02  WBC Latest Range: 4.0-10.5 K/uL 7.2 7.4 12.3 (H)  Hemoglobin Latest Range: 12.0-15.0 g/dL 8.2 (L) 7.5 (L) 9.0 (L)  HCT Latest Range: 36.0-46.0 % 25.6 (L) 23.7 (L) 28.2 (L)  Platelets Latest Range: 150-400 K/uL 166 182 121 (L)     1335:  Pt with multiple diarrheal stools while in the ED without gross blood; sent for culture and cdiff.  +UTI, UC pending, IV rocephin  ordered. Potassium repleted PO, magnesium repleted IV.  +orthostatic on VS, will give judicious IVF.  Continues to deny CP/SOB.  Remains afebrile. Dx testing d/w pt and family.  Questions answered.  Verb understanding, agreeable to admit. T/C to [facility] Dr. Sherrie Mustache, case discussed, including:  HPI, pertinent PM/SHx, VS/PE, dx testing, ED course and treatment:  Agreeable to admit, requests to obtain tele bed to team 1.     I personally performed the services described in this documentation, which was scribed in my presence. The recorded information has been reviewed and considered. [place] Wenk Allison Quarry, DO 08/11/11 [id number]

## 2011-08-10 NOTE — ED Notes (Signed)
Pt c/o pain to IV site left posterior wrist; small amt of swelling noted just above site; IVF infusion stopped and access discontinued; 2nd access placed and IVF infusion restarted.

## 2011-08-10 NOTE — ED Notes (Signed)
CRITICAL VALUE ALERT  Critical value received:  EKG  Date of notification:  08/10/2011  Time of notification:  1019  Nurse who received alert:  Tarri Glenn RN  MD notified (1st page):  Dr Clarene Duke  Time of first page:  1020  MD notified (2nd page):  Time of second page:  Responding MD:  Dr Clarene Duke  Time MD responded:  1020

## 2011-08-10 NOTE — H&P (Signed)
[facility] History and Physical  KAREEMA KEITT ZOX:[id number] DOB: 02/27/1930 DOA: 08/10/2011  Referring physician: DR. Clarene Duke PCP: [name], MD   Chief Complaint: Abdominal pain, nausea, and diarrhea.  HPI:  The patient is a 78 year old woman with a history significant for autoimmune hepatitis, chronic microcytic anemia, and ileitis/colitis, who presents with a chief complaint of abdominal pain, nausea, and diarrhea. She was in her usual state of health until this morning at approximately 6:00 AM when she began having severe 10 over 10 abdominal pain. She describes the pain as sharp. It has been constant for the past few hours. The pain is located at the right lower quadrant and radiates to the left lower quadrant. There is nothing that she has taken that makes the pain worse or better. Shortly after the abdominal pain started, she began to have diarrhea. She says that she had diarrhea continuously from 6 AM until approximately 8:30 AM this morning. She also had a few loose bowel movements in the emergency department. Her stools have been loose and brown. No evidence of bright red blood per rectum or black tarry stools. She has had nausea but no vomiting. She denies pain or discomfort with urination. She denies chest pain, shortness of breath, fever, or chills. She was at a local community event last night where she ate a hot dog and some cantaloupe. She denies any recent antibiotic therapy.  In the emergency department, she is noted to be orthostatic with a blood pressure of 74/46. At rest, her systolic blood pressure ranges from 100-118. She is afebrile. Her lab data are significant for a WBC of 12.3, hemoglobin of 9.0, platelet count of 121, potassium of 3.1, and magnesium of 1.1. CT of her abdomen reveals tiny stones in the fundus of the gallbladder and gallbladder wall edema. She is being admitted for further evaluation and management.   Review of Systems:  Her review of systems  is positive as above in history of present illness. In addition, she has chronic generalized weakness secondary to myopathy from chronic prednisone therapy. Otherwise, review of systems is negative.   Past Medical History  Diagnosis Date  . H/O: GI bleed d/t NSAIDS  . Anemia     fe def anemia. Transfusion dependent.  . DJD (degenerative joint disease)   . Hypertension   . Colitis, acute hx of   . Ileitis hx of  . Pancytopenia 09/03/2010  . Sedimentation rate elevation 09/03/2010  . Autoimmune hepatitis     With leukocytoclastic vasculitis  . Heart block AV second degree March 2013  . Bronchitis   . Myopathy     Steroid-induced   Past Surgical History  Procedure Date  . Colonoscopy   . Upper gastrointestinal endoscopy    Social History: She is single. She has 4 daughters. Her daughter Olegario Messier is with her today. She is retired from [employer]. She smokes 2-3 cigarettes daily. She denies alcohol and illicit drug use. She generally ambulates with a cane.   Allergies  Allergen Reactions  . Iohexol Swelling    IV Dye   . Ivp Dye (Iodinated Diagnostic Agents) Swelling    Hives  . Naprosyn (Naproxen)     Drowsy, hallucinations  . Red Blood Cells Swelling and Dermatitis    With blood transfusion 2012  . Verapamil     Heart block (2nd degree)  . Vicodin (Hydrocodone-Acetaminophen) Other (See Comments)    hallucinations  . Sulfa Antibiotics Rash    Family History  Problem Relation  Age of Onset  . Stroke Mother   . Hypertension Sister   . Hypertension Brother   Her father died of natural causes at 24 years of age.  Prior to Admission medications   Medication Sig Start Date End Date Taking? Authorizing Provider  acetaminophen (TYLENOL) 325 MG tablet Take 650 mg by mouth every 6 (six) hours as needed. For pain   Yes Historical Provider, MD  alendronate (FOSAMAX) 70 MG tablet Take 70 mg by mouth every 7 (seven) days. On saturdays Take with a full glass of water on an  empty stomach.Patient takes on Saturday   Yes Historical Provider, MD  ALPRAZolam Prudy Feeler) 0.25 MG tablet Take 0.25 mg by mouth every 6 (six) hours as needed. For sleep or anxiety   Yes Historical Provider, MD  aspirin EC 81 MG tablet Take 81 mg by mouth daily.   Yes Historical Provider, MD  azaTHIOprine (IMURAN) 50 MG tablet Take 1.5 tablets (75 mg total) by mouth daily. 04/08/11 04/07/12 Yes Malissa Hippo, MD  Coenzyme Q10 (CO Q 10) 100 MG CAPS Take 200 mg by mouth daily.   Yes Historical Provider, MD  darbepoetin alfa-polysorbate (ARANESP, ALBUMIN FREE,) 500 MCG/ML injection Inject 500 mcg into the skin once.   Yes Historical Provider, MD  dexlansoprazole (DEXILANT) 60 MG capsule Take 1 capsule (60 mg total) by mouth daily. 04/22/11  Yes Len Blalock, NP  diclofenac sodium (VOLTAREN) 1 % GEL Apply 1 application topically 4 (four) times daily.   Yes Historical Provider, MD  docusate sodium (COLACE) 100 MG capsule Take 1 capsule (100 mg total) by mouth 2 (two) times daily. 04/26/11 04/25/12 Yes Malissa Hippo, MD  fish oil-omega-3 fatty acids 1000 MG capsule Take 1 g by mouth daily.   Yes Historical Provider, MD  furosemide (LASIX) 20 MG tablet Take 20 mg by mouth daily as needed. For fluid retention 04/26/11  Yes Malissa Hippo, MD  metoprolol tartrate (LOPRESSOR) 25 MG tablet Take 12.5 mg by mouth 2 (two) times daily.   Yes Historical Provider, MD  Multiple Vitamins-Minerals (MULTIVITAMIN WITH MINERALS) tablet Take 1 tablet by mouth daily.     Yes Historical Provider, MD  predniSONE (DELTASONE) 10 MG tablet Take 10 mg by mouth daily. 04/26/11  Yes Malissa Hippo, MD  spironolactone (ALDACTONE) 50 MG tablet Take 50 mg by mouth daily. 04/26/11  Yes Malissa Hippo, MD  ursodiol (ACTIGALL) 250 MG tablet Take 500 mg by mouth 2 (two) times daily. 08/02/11 08/01/12 Yes Len Blalock, NP  vitamin B-12 (CYANOCOBALAMIN) 1000 MCG tablet Take 1,000 mcg by mouth daily.   Yes Historical Provider, MD   Physical  Exam: Filed Vitals:   08/10/11 1305 08/10/11 1307 08/10/11 1357 08/10/11 1400  BP: 90/42 74/46 98/52  107/59  Pulse: 108 115  103  Temp:    99.2 F (37.3 C)  TempSrc:    Oral  Resp:      Height:      Weight:      SpO2: 96%   99%     General:  Overweight elderly 78 year old African American woman lying in bed in no acute distress, but she appears ill.  Eyes: Pupils equal, round, and reactive to light. Extraocular movements are intact. Conjunctivae are clear. Sclerae are discolored but not icteric..  ENT: Oropharynx reveals no teeth. He does membranes are mildly dry.  Neck: Supple, no adenopathy, no thyromegaly, no JVD.  Cardiovascular: S1, S2, with 1-2/6 systolic murmur.  Respiratory: Decreased breath  sounds in the bases. Clear otherwise. Breathing is nonlabored.  Abdomen: Obese, positive bowel sounds, soft, mildly tender over the left lower quadrant, suprapubic area, and right lower quadrant. No rigidity, no masses palpated, no hepatosplenomegaly.  Skin: Fair turgor. No rashes.  Musculoskeletal: Pedal pulses palpable. No pedal edema. No acute hot red joints.  Psychiatric: Sad affect. Alert and oriented x3.  Neurologic: Cranial nerves II through XII are grossly intact.  Labs on Admission:  Basic Metabolic Panel:  Lab 08/10/11 1610  NA 137  K 3.1*  CL 102  CO2 21  GLUCOSE 108*  BUN 18  CREATININE 0.95  CALCIUM 9.6  MG 1.1*  PHOS --   Liver Function Tests:  Lab 08/10/11 1002  AST 46*  ALT 14  ALKPHOS 61  BILITOT 1.0  PROT 5.1*  ALBUMIN 2.5*    Lab 08/10/11 1002  LIPASE 47  AMYLASE --   No results found for this basename: AMMONIA:5 in the last 168 hours CBC:  Lab 08/10/11 1002  WBC 12.3*  NEUTROABS 11.6*  HGB 9.0*  HCT 28.2*  MCV 78.6  PLT 121*   Cardiac Enzymes:  Lab 08/10/11 1002  CKTOTAL --  CKMB --  CKMBINDEX --  TROPONINI <0.30    BNP (last 3 results)  Basename 03/05/11 1538  PROBNP 1299.0*   CBG: No results found for this  basename: GLUCAP:5 in the last 168 hours  Radiological Exams on Admission: Ct Abdomen Pelvis Wo Contrast  08/10/2011  *RADIOLOGY REPORT*  Clinical Data: Abdominal pain, nausea and bloody diarrhea.  CT ABDOMEN AND PELVIS WITHOUT CONTRAST  Technique:  Multidetector CT imaging of the abdomen and pelvis was performed following the standard protocol without intravenous contrast.  Comparison: 09/09/2010  Findings: The lung bases appear clear.  No pericardial or pleural effusion.  There is no focal liver abnormality.  Tiny stones are noted layering within the gallbladder fundus.  Gallbladder wall thickening/edema is again identified as seen on ultrasound from 03/17/2011.  There is no significant biliary dilatation.  Normal appearance of the pancreas.  The spleen is normal.  Both adrenal glands are within normal limits.  There is a tiny cyst within the lower pole of the right kidney.  This is incompletely characterized without IV contrast.  No right-sided hydronephrosis or nephrolithiasis.  Left renal cyst measures 3.4 mm.  There is a nonobstructing calculus within the mid pole of the left kidney measuring 3 mm, image number 33.  Urinary bladder appears normal. The uterus and the adnexal structures have a normal physiologic appearance for patient's age.  No pathologic adenopathy within the upper abdomen.  There is no pelvic or inguinal adenopathy.  The stomach is normal.  The small bowel loops are unremarkable.  The appendix is identified and appears normal.  Normal appearance of the colon.  No wall thickening or inflammatory change noted.  No obstructing mass identified.  Review of the visualized osseous structures is significant for lumbar degenerative disc disease.  Most severe at L4-5 and L5-S1.  IMPRESSION:  1.  Tiny stones are suspected within the fundus of the gallbladder. There is gallbladder wall edema.  In the setting of liver disease this may be a nonspecific finding.  If there is a concern for acute  cholecystitis a nuclear medicine hepatobiliary scan may be helpful to assess the patency of the cystic duct. 2.  Nonobstructing left renal calculus. 3.  Bilateral renal cysts.  These are incompletely characterized without IV contrast.  Original Report Authenticated By: Rosealee Albee,  M.D.   Dg Chest Port 1 View  08/10/2011  *RADIOLOGY REPORT*  Clinical Data: Nausea, abdominal pain.  PORTABLE CHEST - 1 VIEW  Comparison: 03/05/2011  Findings: Cardiomegaly.  The lungs are clear.  No effusions.  No acute bony abnormality.  IMPRESSION: Cardiomegaly.  No active disease.  Original Report Authenticated By: Cyndie Chime, M.D.    EKG: The computer reads EKG as atrial fibrillation with a heart rate of 125 beats per minute, however, there is a lot of artifact. There is prolonged QTc interval of 565. No old EKG currently to compare.  Assessment/Plan Principal Problem:  *Abdominal pain, acute, generalized Active Problems:  Diarrhea  UTI (urinary tract infection)  Orthostatic hypotension  Nausea  Hypomagnesemia  Hypokalemia  Elevated liver enzymes  Autoimmune hepatitis  Prolonged Q-T interval on ECG  Gallstones  Thrombocytopenia   1. Abdominal pain, diarrhea, and nausea. The patient has a history of colitis and ileitis (but her daughter [name] states that she was never told that her mother had colitis/ileitis before). There is no evidence of colitis or ileitis radiographically on the CT today. She does have gallstones. She has evidence of UTI. She also ate a hot dog and cantaloupe at a community event last night. She has had no recent antibiotic treatment. She could have a viral gastroenteritis versus gastroenteritis from food intoxication versus manifestations of a UTI or gallbladder dysfunction.  2. Gallstones/cholelithiasis. Associated gallbladder edema. She is not particularly tender in the right upper quadrant.  3. Urinary tract infection.  4. Hypomagnesemia and hypokalemia. Possibly  secondary to diarrhea.  5. Prolonged QTc interval. Atrial fibrillation is showing on EKG, however on exam, there was no ectopy. EKG shows a lot of artifact. She does have a history of second degree AV block from [place] female which was eventually discontinued by her cardiologist.  6. Orthostatic hypotension, likely secondary to hypovolemia  7. Autoimmune hepatitis, on chronic prednisone therapy.  8. Chronic microcytic anemia. She was transfused 2 units of packed red blood cells a couple few ago as ordered by Dr. Mariel Sleet. She also receives Aranesp every 3 weeks for treatment.  9. Thrombocytopenia. A few days ago, her platelet count was normal. Today it is 121. This may be a manifestation of infection.     Plan:  1. The patient was given IV Rocephin in the emergency department. 2. We'll start Cipro and Flagyl for a possible enteric infection and for treatment of urinary tract infection. 3. IV fluid hydration. 4. The patient was given oral potassium chloride and 2 g of magnesium sulfate in the emergency department. Will continue supplementation as needed. 5. Supportive treatment with as needed analgesics and as needed antiemetics. We'll continue proton pump inhibitor therapy with Protonix. 6. Clear liquid diet for now. 7. We'll give one stress dose of IV hydrocortisone and continue prednisone at a slightly higher dose tomorrow. 8. Urine culture and stool culture were ordered in the emergency department. C. difficile was also ordered. For further evaluation, we'll order a HIDA scan and other laboratory studies in the morning. We'll also order a followup EKG in the morning.  Code Status: Full code Family Communication: Discussed with her daughter, [name] Disposition Plan: To be determined  Time spent: One hour and 15 minutes  Jamarien Rodkey [facility] Pager [phone]  If 7PM-7AM, please contact night-coverage [email or web address] Password [email or web address] 08/10/2011, 3:12 PM

## 2011-08-10 NOTE — ED Notes (Signed)
Assumed c/o pt; report rec'd from Tonita Phoenix, RN.

## 2011-08-10 NOTE — ED Notes (Signed)
States started having nausea and diarrhea this am around 6am. Denies vomiting. Felt fine when went to bed last night.

## 2011-08-11 ENCOUNTER — Encounter (HOSPITAL_COMMUNITY): Payer: Self-pay

## 2011-08-11 ENCOUNTER — Inpatient Hospital Stay (HOSPITAL_COMMUNITY): Payer: Medicare Other

## 2011-08-11 DIAGNOSIS — R52 Pain, unspecified: Secondary | ICD-10-CM

## 2011-08-11 DIAGNOSIS — N390 Urinary tract infection, site not specified: Principal | ICD-10-CM

## 2011-08-11 DIAGNOSIS — K8020 Calculus of gallbladder without cholecystitis without obstruction: Secondary | ICD-10-CM

## 2011-08-11 DIAGNOSIS — R1084 Generalized abdominal pain: Secondary | ICD-10-CM

## 2011-08-11 LAB — COMPREHENSIVE METABOLIC PANEL
AST: 44 U/L — ABNORMAL HIGH (ref 0–37)
Albumin: 2.3 g/dL — ABNORMAL LOW (ref 3.5–5.2)
Alkaline Phosphatase: 54 U/L (ref 39–117)
BUN: 18 mg/dL (ref 6–23)
Creatinine, Ser: 0.91 mg/dL (ref 0.50–1.10)
Potassium: 4.7 mEq/L (ref 3.5–5.1)
Total Protein: 5 g/dL — ABNORMAL LOW (ref 6.0–8.3)

## 2011-08-11 LAB — CBC
HCT: 24.7 % — ABNORMAL LOW (ref 36.0–46.0)
MCV: 77.4 fL — ABNORMAL LOW (ref 78.0–100.0)
RBC: 3.19 MIL/uL — ABNORMAL LOW (ref 3.87–5.11)
WBC: 15.9 10*3/uL — ABNORMAL HIGH (ref 4.0–10.5)

## 2011-08-11 MED ORDER — BENZONATATE 100 MG PO CAPS: 100.0000 mg | ORAL_CAPSULE | Freq: Three times a day (TID) | ORAL | Status: DC | PRN

## 2011-08-11 MED ORDER — GUAIFENESIN-DM 100-10 MG/5ML PO SYRP
5.0000 mL | ORAL_SOLUTION | ORAL | Status: DC | PRN
  Administered 2011-08-11: 5 mL via ORAL
  Filled 2011-08-11: qty 5

## 2011-08-11 MED ORDER — TECHNETIUM TC 99M MEBROFENIN IV KIT
5.0000 | PACK | Freq: Once | INTRAVENOUS | Status: AC | PRN
  Administered 2011-08-11: 5.4 via INTRAVENOUS

## 2011-08-11 MED ORDER — MAGNESIUM OXIDE 400 (241.3 MG) MG PO TABS
400.0000 mg | ORAL_TABLET | Freq: Two times a day (BID) | ORAL | Status: DC
  Administered 2011-08-12: 400 mg via ORAL
  Filled 2011-08-11: qty 1

## 2011-08-11 MED ORDER — CIPROFLOXACIN HCL 250 MG PO TABS
500.0000 mg | ORAL_TABLET | Freq: Two times a day (BID) | ORAL | Status: DC
  Administered 2011-08-11 – 2011-08-12 (×2): 500 mg via ORAL
  Filled 2011-08-11 (×2): qty 2

## 2011-08-11 MED ORDER — SODIUM CHLORIDE 0.9 % IV SOLN
INTRAVENOUS | Status: DC
  Administered 2011-08-11 – 2011-08-12 (×2): via INTRAVENOUS

## 2011-08-11 NOTE — Progress Notes (Signed)
UR chart review completed.  

## 2011-08-11 NOTE — Progress Notes (Signed)
Subjective: The patient says that she has no abdominal pain. Her last bowel movement was last night. She has no nausea vomiting.  Objective: Vital signs in last 24 hours: Filed Vitals:   08/10/11 1600 08/10/11 1632 08/10/11 2055 08/11/11 0606  BP: 111/53 106/69 100/63 114/63  Pulse: 96 93 93 95  Temp: 99.2 F (37.3 C) 99 F (37.2 C) 98.8 F (37.1 C) 99.3 F (37.4 C)  TempSrc: Oral Oral Oral Oral  Resp: 18 18 24 20   Height:  5\' 6"  (1.676 m)    Weight:  70.6 kg (155 lb 10.3 oz)    SpO2: 99% 96% 98% 96%    Intake/Output Summary (Last 24 hours) at 08/11/11 1524 Last data filed at 08/11/11 1254  Gross per 24 hour  Intake 1773.75 ml  Output   1450 ml  Net 323.75 ml    Weight change:   Physical exam: General: Pleasant 78 year old African-American woman lying in bed, in no acute distress. Lungs: Clear to auscultation bilaterally. Decreased breath sounds in the bases. Heart: S1, S2, with a 1-2/6 systolic murmur. Abdomen: Obese, positive bowel sounds, soft, nontender, nondistended. Extremities: No pedal edema.  Lab Results: Basic Metabolic Panel:  Basename 08/11/11 0456 08/10/11 1002  NA 138 137  K 4.7 3.1*  CL 108 102  CO2 22 21  GLUCOSE 89 108*  BUN 18 18  CREATININE 0.91 0.95  CALCIUM 8.9 9.6  MG 1.7 1.1*  PHOS -- --   Liver Function Tests:  Basename 08/11/11 0456 08/10/11 1002  AST 44* 46*  ALT 13 14  ALKPHOS 54 61  BILITOT 0.9 1.0  PROT 5.0* 5.1*  ALBUMIN 2.3* 2.5*    Basename 08/10/11 1002  LIPASE 47  AMYLASE --   No results found for this basename: AMMONIA:2 in the last 72 hours CBC:  Basename 08/11/11 0456 08/10/11 1002  WBC 15.9* 12.3*  NEUTROABS -- 11.6*  HGB 8.0* 9.0*  HCT 24.7* 28.2*  MCV 77.4* 78.6  PLT 123* 121*   Cardiac Enzymes:  Basename 08/10/11 1002  CKTOTAL --  CKMB --  CKMBINDEX --  TROPONINI <0.30   BNP: No results found for this basename: PROBNP:3 in the last 72 hours D-Dimer: No results found for this basename:  DDIMER:2 in the last 72 hours CBG: No results found for this basename: GLUCAP:6 in the last 72 hours Hemoglobin A1C: No results found for this basename: HGBA1C in the last 72 hours Fasting Lipid Panel: No results found for this basename: CHOL,HDL,LDLCALC,TRIG,CHOLHDL,LDLDIRECT in the last 72 hours Thyroid Function Tests:  Basename 08/10/11 1002  TSH 0.703  T4TOTAL --  FREET4 --  T3FREE --  THYROIDAB --   Anemia Panel: No results found for this basename: VITAMINB12,FOLATE,FERRITIN,TIBC,IRON,RETICCTPCT in the last 72 hours Coagulation: No results found for this basename: LABPROT:2,INR:2 in the last 72 hours Urine Drug Screen: Drugs of Abuse  No results found for this basename: labopia, cocainscrnur, labbenz, amphetmu, thcu, labbarb    Alcohol Level: No results found for this basename: ETH:2 in the last 72 hours Urinalysis:  Basename 08/10/11 1200  COLORURINE YELLOW  LABSPEC 1.010  PHURINE 6.0  GLUCOSEU NEGATIVE  HGBUR SMALL*  BILIRUBINUR SMALL*  KETONESUR NEGATIVE  PROTEINUR NEGATIVE  UROBILINOGEN 0.2  NITRITE POSITIVE*  LEUKOCYTESUR SMALL*   Misc. Labs:   Micro: Recent Results (from the past 240 hour(s))  URINE CULTURE     Status: Normal (Preliminary result)   Collection Time   08/10/11 12:00 PM      Component Value Range  Status Comment   Specimen Description URINE, CLEAN CATCH   Final    Special Requests NONE   Final    Culture  Setup Time 08/10/2011 13:15   Final    Colony Count 80,000 COLONIES/ML   Final    Culture GRAM NEGATIVE RODS   Final    Report Status PENDING   Incomplete   CLOSTRIDIUM DIFFICILE BY PCR     Status: Normal   Collection Time   08/10/11  1:24 PM      Component Value Range Status Comment   C difficile by pcr NEGATIVE  NEGATIVE Final     Studies/Results: Ct Abdomen Pelvis Wo Contrast  08/10/2011  *RADIOLOGY REPORT*  Clinical Data: Abdominal pain, nausea and bloody diarrhea.  CT ABDOMEN AND PELVIS WITHOUT CONTRAST  Technique:   Multidetector CT imaging of the abdomen and pelvis was performed following the standard protocol without intravenous contrast.  Comparison: 09/09/2010  Findings: The lung bases appear clear.  No pericardial or pleural effusion.  There is no focal liver abnormality.  Tiny stones are noted layering within the gallbladder fundus.  Gallbladder wall thickening/edema is again identified as seen on ultrasound from 03/17/2011.  There is no significant biliary dilatation.  Normal appearance of the pancreas.  The spleen is normal.  Both adrenal glands are within normal limits.  There is a tiny cyst within the lower pole of the right kidney.  This is incompletely characterized without IV contrast.  No right-sided hydronephrosis or nephrolithiasis.  Left renal cyst measures 3.4 mm.  There is a nonobstructing calculus within the mid pole of the left kidney measuring 3 mm, image number 33.  Urinary bladder appears normal. The uterus and the adnexal structures have a normal physiologic appearance for patient's age.  No pathologic adenopathy within the upper abdomen.  There is no pelvic or inguinal adenopathy.  The stomach is normal.  The small bowel loops are unremarkable.  The appendix is identified and appears normal.  Normal appearance of the colon.  No wall thickening or inflammatory change noted.  No obstructing mass identified.  Review of the visualized osseous structures is significant for lumbar degenerative disc disease.  Most severe at L4-5 and L5-S1.  IMPRESSION:  1.  Tiny stones are suspected within the fundus of the gallbladder. There is gallbladder wall edema.  In the setting of liver disease this may be a nonspecific finding.  If there is a concern for acute cholecystitis a nuclear medicine hepatobiliary scan may be helpful to assess the patency of the cystic duct. 2.  Nonobstructing left renal calculus. 3.  Bilateral renal cysts.  These are incompletely characterized without IV contrast.  Original Report  Authenticated By: Rosealee Albee, M.D.   Nm Hepato W/eject Fract  08/11/2011  *RADIOLOGY REPORT*  Clinical Data: Abdominal pain for several days.  NUCLEAR MEDICINE HEPATOBILIARY WITH GB, PHARM AND QUAN MEASURE  Radiopharmaceutical:  5.4 mCi technetium 99 Choletec.  Ensure Plus was utilized as the gallbladder contraction stimulant.  Comparison: 08/10/2011 CT scan  Findings: There is satisfactory uptake of radiopharmaceutical from the blood pool.  Gallbladder activity is visible at 15 minutes. Bile duct activity is visible by 60 minutes.  After administration of oral Ensure Plus, bowel activity is clearly identified.  Gallbladder ejection overt an hour-long observation is 28% (normally greater than 30%).  IMPRESSION:  1.  Mild gallbladder dysfunction, with ejection fraction at 28% (normally greater than 30%). 2.  No evidence of cystic duct obstruction or common bile duct obstruction.  Original Report Authenticated By: Dellia Cloud, M.D.   Dg Chest Port 1 View  08/10/2011  *RADIOLOGY REPORT*  Clinical Data: Nausea, abdominal pain.  PORTABLE CHEST - 1 VIEW  Comparison: 03/05/2011  Findings: Cardiomegaly.  The lungs are clear.  No effusions.  No acute bony abnormality.  IMPRESSION: Cardiomegaly.  No active disease.  Original Report Authenticated By: Cyndie Chime, M.D.    Medications:  Scheduled:   . aspirin EC  81 mg Oral Daily  . azaTHIOprine  75 mg Oral Daily  . ciprofloxacin  500 mg Oral BID  . diclofenac sodium  1 application Topical QID  . magnesium sulfate  2 g Intravenous Once  . pantoprazole  40 mg Oral Q1200  . predniSONE  20 mg Oral Daily  . spironolactone  50 mg Oral Daily  . ursodiol  500 mg Oral BID  . DISCONTD: ciprofloxacin  400 mg Intravenous Q12H  . DISCONTD: metronidazole  500 mg Intravenous Q8H   Continuous:   . sodium chloride 50 mL/hr at 08/11/11 1506  . DISCONTD: 0.9 % NaCl with KCl 40 mEq / L 75 mL/hr at 08/10/11 1800   WUJ:[id number], acetaminophen,  albuterol, ALPRAZolam, alum & mag hydroxide-simeth, morphine injection, ondansetron (ZOFRAN) IV, ondansetron, technetium TC 50M mebrofenin, DISCONTD: ondansetron  Assessment: Principal Problem:  *Abdominal pain, acute, generalized Active Problems:  Diarrhea  UTI (urinary tract infection)  Orthostatic hypotension  Nausea  Hypomagnesemia  Hypokalemia  Elevated liver enzymes  Autoimmune hepatitis  Prolonged Q-T interval on ECG  Gallstones  Thrombocytopenia   The patient is symptomatically improved. Her C. difficile PCR is negative. Her HIDA scan revealed only a marginally decreased ejection fraction. Her prolonged QT interval has resolved with magnesium and potassium chloride supplementation. We'll continue treatment of the urinary tract infection. We'll discontinue Flagyl. Her white blood cell count is elevated, so we'll keep her another day for observation. It is possible that her elevated white blood cell count is secondary to steroid therapy. We'll continue supplementing her potassium and magnesium.  Plan: 1. Decrease IV fluids. 2. Discontinue Flagyl. Continue Cipro orally.  3. Advance diet. 4. We'll check the results of the urine culture when available.   LOS: 1 day   Neave Lenger 08/11/2011, 3:24 PM

## 2011-08-11 NOTE — Progress Notes (Signed)
Pt with dry cough at this time. Requesting cough drops. Dr. Sherrie Mustache paged and awaiting return page at this time.

## 2011-08-12 ENCOUNTER — Encounter (HOSPITAL_COMMUNITY): Payer: Self-pay | Admitting: Internal Medicine

## 2011-08-12 DIAGNOSIS — N200 Calculus of kidney: Secondary | ICD-10-CM | POA: Diagnosis present

## 2011-08-12 HISTORY — DX: Calculus of kidney: N20.0

## 2011-08-12 LAB — URINE CULTURE: Colony Count: 80000

## 2011-08-12 LAB — COMPREHENSIVE METABOLIC PANEL
Alkaline Phosphatase: 65 U/L (ref 39–117)
BUN: 15 mg/dL (ref 6–23)
Chloride: 108 mEq/L (ref 96–112)
GFR calc Af Amer: 65 mL/min — ABNORMAL LOW (ref >90)
GFR calc non Af Amer: 56 mL/min — ABNORMAL LOW (ref >90)
Glucose, Bld: 122 mg/dL — ABNORMAL HIGH (ref 70–99)
Potassium: 4.5 mEq/L (ref 3.5–5.1)
Total Bilirubin: 0.7 mg/dL (ref 0.3–1.2)

## 2011-08-12 LAB — CBC
MCV: 77.3 fL — ABNORMAL LOW (ref 78.0–100.0)
Platelets: 109 10*3/uL — ABNORMAL LOW (ref 150–400)
RDW: 22.7 % — ABNORMAL HIGH (ref 11.5–15.5)
WBC: 11.8 10*3/uL — ABNORMAL HIGH (ref 4.0–10.5)

## 2011-08-12 MED ORDER — CIPROFLOXACIN HCL 500 MG PO TABS: 500.0000 mg | ORAL_TABLET | Freq: Two times a day (BID) | ORAL | Status: DC

## 2011-08-12 MED ORDER — MAGNESIUM OXIDE 400 (241.3 MG) MG PO TABS: 400.0000 mg | ORAL_TABLET | Freq: Two times a day (BID) | ORAL | Status: DC

## 2011-08-12 NOTE — Discharge Summary (Signed)
Physician Discharge Summary  Eileen Santana:[id number] DOB: 01-13-1930 DOA: 08/10/2011  PCP: [name], MD  Admit date: 08/10/2011 Discharge date: 08/12/2011  Recommendations for Outpatient Follow-up:  1. The patient was discharged to home in improved condition. She will followup with Dr. Loleta Chance, Dr. Mariel Sleet, Dr. Karilyn Cota as scheduled.   Discharge Diagnoses:  1. Urinary tract infection secondary to gram-negative rods. 2. Diarrhea. C. difficile PCR negative. 3. Nausea/abdominal pain. Resolved. 4. Severe hypomagnesemia, repleted. 5. Hypokalemia. Repleted. 6. Prolonged QT interval on EKG, resolved following supplementation with magnesium and potassium. 7. Cholelithiasis. Mild gallbladder dysfunction with an ejection fraction of 28% (normal is 30% or greater). 8. Orthostatic hypotension secondary to hypovolemia/volume depletion. 9. Chronic autoimmune hepatitis, on chronic suppressive therapy. 10. Chronic thrombocytopenia. 11. Chronic anemia, transfusion dependent. 12. Right renal calculus.  Discharge Condition: Improved.  Diet recommendation: heart healthy.   Wt Readings from Last 3 Encounters:  08/10/11 70.6 kg (155 lb 10.3 oz)  08/02/11 70.852 kg (156 lb 3.2 oz)  05/23/11 71.169 kg (156 lb 14.4 oz)    History of present illness:  The patient is a 78 year old woman with a past medical history significant for autoimmune hepatitis, chronic microcytic anemia, and steroid-induced myopathy, who presented to the emergency department on 08/10/2011 with a chief complaint of abdominal pain, nausea, and diarrhea. In the emergency department, she was noted to be orthostatic with a blood pressure 74/46 when she stood up. At rest, her systolic blood pressure was 100 to 118. She was afebrile. Her lab data were significant for a WBC of 12.3, hemoglobin of 9.0, platelet count of 121, potassium of 3.1, and magnesium of 1.1. Her EKG revealed prolonged QTc interval. CT scan of her abdomen and pelvis  revealed tiny stones in the gallbladder and gallbladder wall edema. Her urinalysis was indicative of infection. She was admitted for further evaluation and management.   Hospital Course:  She was started on treatment with IV Rocephin given in the emergency department. However, given her symptomatology and presentation, antibiotic therapy was continued with Cipro and antiparasitic therapy with Flagyl for a possible enteric infection. IV fluid hydration was started. She was supplemented with potassium chloride orally and in the IV fluids. She was given 2 g of magnesium sulfate. Supportive treatment was started with as needed analgesics and as needed antiemetics. Proton pump inhibitor therapy was continued with Protonix. A clear liquid diet was started. One stress dose of IV hydrocortisone was given followed by a slightly elevated oral dose of prednisone. For further evaluation, a number of studies were ordered. Her AST ranged from 46-44, but her ALT and total bilirubin were well within normal limits. Her troponin I was less than 0.30. Her TSH was within normal limits at 0.7. C. difficile by PCR was negative. Her urine culture grew out 80,000 colonies of gram-negative rods. The specific identification was pending at the time of discharge. The stool culture was pending at the time of discharge. Her HIDA scan revealed mild gallbladder dysfunction with an ejection fraction of 28% (normal is  30% or greater) but with no evidence of cystic duct obstruction or common bile duct obstruction. Her followup serum potassium improved to 4.5. Her followup magnesium level improved to 1.9. Her followup EKG revealed resolution of QTC prolongation. Over the course of the hospitalization, her abdominal pain, nausea, and diarrhea completely resolved. Her diet was advanced. She remained hemodynamically stable. Her white blood cell count improved to 11.8. The mild persistent elevation may have been secondary to stress dose IV  hydrocortisone. Her hemoglobin did decrease from 9.0-8.0, likely from the dilutional effects of the IV fluids. Her platelet count was 121 on admission and 109 at the time of discharge. The patient has a history of chronic anemia and thrombocytopenia associated with autoimmune hepatitis. She is followed by both Dr. Mariel Sleet and Dr. Karilyn Cota.  Flagyl was discontinued when her C. difficile PCR was found to be negative. She was maintained on Cipro. She was discharged to home on 5 more days of Cipro after receiving antibiotic therapy for 2-1/2 days. She was afebrile at the time of discharge.  Procedures:  None  Consultations:  None   Discharge Exam: Filed Vitals:   08/12/11 0644  BP: 108/60  Pulse: 89  Temp: 98.5 F (36.9 C)  Resp: 20   Filed Vitals:   08/11/11 1400 08/11/11 2249 08/11/11 2250 08/12/11 0644  BP: 120/74  109/65 108/60  Pulse: 92  92 89  Temp: 98.2 F (36.8 C)  98.9 F (37.2 C) 98.5 F (36.9 C)  TempSrc:   Oral Oral  Resp: 20  20 20   Height:      Weight:      SpO2: 98% 96% 96% 99%    General: pleasant alert 78 year old African-American African-American woman sitting up in bed, in no acute distress.  Cardiovascular: S1, S2, with a soft systolic murmur. Abdomen: Positive bowel sounds, soft, obese, nontender, nondistended.  Respiratory: decreased breath sounds in the bases and clear anteriorly.   Discharge Instructions  Discharge Orders    Future Appointments: Provider: Department: Dept Phone: Center:   08/23/2011 9:30 AM Malissa Hippo, MD [facility] [phone] None   08/23/2011 11:00 AM [facility] [phone] None   09/23/2011 2:00 PM Randall An, MD [facility] [phone] None     Future Orders Please Complete By Expires   Diet - low sodium heart healthy      Increase activity slowly        Medication List  As of 08/12/2011 10:26 AM   TAKE these medications         acetaminophen 325 MG tablet   Commonly known as: TYLENOL    Take 650 mg by mouth every 6 (six) hours as needed. For pain      alendronate 70 MG tablet   Commonly known as: FOSAMAX   Take 70 mg by mouth every 7 (seven) days. On saturdays Take with a full glass of water on an empty stomach.Patient takes on Saturday      ALPRAZolam 0.25 MG tablet   Commonly known as: XANAX   Take 0.25 mg by mouth every 6 (six) hours as needed. For sleep or anxiety      ARANESP (ALBUMIN FREE) 500 MCG/ML injection   Generic drug: darbepoetin alfa-polysorbate   Inject 500 mcg into the skin once.      aspirin EC 81 MG tablet   Take 81 mg by mouth daily.      azaTHIOprine 50 MG tablet   Commonly known as: IMURAN   Take 1.5 tablets (75 mg total) by mouth daily.      ciprofloxacin 500 MG tablet   Commonly known as: CIPRO   Take 1 tablet (500 mg total) by mouth 2 (two) times daily. Antibiotic to be taken for 5 more days.      Co Q 10 100 MG Caps   Take 200 mg by mouth daily.      dexlansoprazole 60 MG capsule   Commonly known as: DEXILANT  Take 1 capsule (60 mg total) by mouth daily.      diclofenac sodium 1 % Gel   Commonly known as: VOLTAREN   Apply 1 application topically 4 (four) times daily.      docusate sodium 100 MG capsule   Commonly known as: COLACE   Take 1 capsule (100 mg total) by mouth 2 (two) times daily.      fish oil-omega-3 fatty acids 1000 MG capsule   Take 1 g by mouth daily.      furosemide 20 MG tablet   Commonly known as: LASIX   Take 20 mg by mouth daily as needed. For fluid retention      magnesium oxide 400 (241.3 MG) MG tablet   Commonly known as: MAG-OX   Take 1 tablet (400 mg total) by mouth 2 (two) times daily.      metoprolol tartrate 25 MG tablet   Commonly known as: LOPRESSOR   Take 12.5 mg by mouth 2 (two) times daily.      multivitamin with minerals tablet   Take 1 tablet by mouth daily.      predniSONE 10 MG tablet   Commonly known as: DELTASONE   Take 10 mg by mouth daily.      spironolactone 50 MG  tablet   Commonly known as: ALDACTONE   Take 50 mg by mouth daily.      ursodiol 250 MG tablet   Commonly known as: ACTIGALL   Take 500 mg by mouth 2 (two) times daily.      vitamin B-12 1000 MCG tablet   Commonly known as: CYANOCOBALAMIN   Take 1,000 mcg by mouth daily.           Follow-up Information    Follow up with [name], MD on 08/19/2011. (10:45am)    Contact information:   [address] [place] [address] [phone]           The results of significant diagnostics from this hospitalization (including imaging, microbiology, ancillary and laboratory) are listed below for reference.    Significant Diagnostic Studies: Ct Abdomen Pelvis Wo Contrast  08/10/2011  *RADIOLOGY REPORT*  Clinical Data: Abdominal pain, nausea and bloody diarrhea.  CT ABDOMEN AND PELVIS WITHOUT CONTRAST  Technique:  Multidetector CT imaging of the abdomen and pelvis was performed following the standard protocol without intravenous contrast.  Comparison: 09/09/2010  Findings: The lung bases appear clear.  No pericardial or pleural effusion.  There is no focal liver abnormality.  Tiny stones are noted layering within the gallbladder fundus.  Gallbladder wall thickening/edema is again identified as seen on ultrasound from 03/17/2011.  There is no significant biliary dilatation.  Normal appearance of the pancreas.  The spleen is normal.  Both adrenal glands are within normal limits.  There is a tiny cyst within the lower pole of the right kidney.  This is incompletely characterized without IV contrast.  No right-sided hydronephrosis or nephrolithiasis.  Left renal cyst measures 3.4 mm.  There is a nonobstructing calculus within the mid pole of the left kidney measuring 3 mm, image number 33.  Urinary bladder appears normal. The uterus and the adnexal structures have a normal physiologic appearance for patient's age.  No pathologic adenopathy within the upper abdomen.  There is  no pelvic or inguinal adenopathy.  The stomach is normal.  The small bowel loops are unremarkable.  The appendix is identified and appears normal.  Normal appearance of the colon.  No  wall thickening or inflammatory change noted.  No obstructing mass identified.  Review of the visualized osseous structures is significant for lumbar degenerative disc disease.  Most severe at L4-5 and L5-S1.  IMPRESSION:  1.  Tiny stones are suspected within the fundus of the gallbladder. There is gallbladder wall edema.  In the setting of liver disease this may be a nonspecific finding.  If there is a concern for acute cholecystitis a nuclear medicine hepatobiliary scan may be helpful to assess the patency of the cystic duct. 2.  Nonobstructing left renal calculus. 3.  Bilateral renal cysts.  These are incompletely characterized without IV contrast.  Original Report Authenticated By: Rosealee Albee, M.D.   Nm Hepato W/eject Fract  08/11/2011  *RADIOLOGY REPORT*  Clinical Data: Abdominal pain for several days.  NUCLEAR MEDICINE HEPATOBILIARY WITH GB, PHARM AND QUAN MEASURE  Radiopharmaceutical:  5.4 mCi technetium 99 Choletec.  Ensure Plus was utilized as the gallbladder contraction stimulant.  Comparison: 08/10/2011 CT scan  Findings: There is satisfactory uptake of radiopharmaceutical from the blood pool.  Gallbladder activity is visible at 15 minutes. Bile duct activity is visible by 60 minutes.  After administration of oral Ensure Plus, bowel activity is clearly identified.  Gallbladder ejection overt an hour-long observation is 28% (normally greater than 30%).  IMPRESSION:  1.  Mild gallbladder dysfunction, with ejection fraction at 28% (normally greater than 30%). 2.  No evidence of cystic duct obstruction or common bile duct obstruction.  Original Report Authenticated By: Dellia Cloud, M.D.   Dg Chest Port 1 View  08/10/2011  *RADIOLOGY REPORT*  Clinical Data: Nausea, abdominal pain.  PORTABLE CHEST - 1 VIEW   Comparison: 03/05/2011  Findings: Cardiomegaly.  The lungs are clear.  No effusions.  No acute bony abnormality.  IMPRESSION: Cardiomegaly.  No active disease.  Original Report Authenticated By: Cyndie Chime, M.D.    Microbiology: Recent Results (from the past 240 hour(s))  URINE CULTURE     Status: Normal (Preliminary result)   Collection Time   08/10/11 12:00 PM      Component Value Range Status Comment   Specimen Description URINE, CLEAN CATCH   Final    Special Requests NONE   Final    Culture  Setup Time 08/10/2011 13:15   Final    Colony Count 80,000 COLONIES/ML   Final    Culture GRAM NEGATIVE RODS   Final    Report Status PENDING   Incomplete   CLOSTRIDIUM DIFFICILE BY PCR     Status: Normal   Collection Time   08/10/11  1:24 PM      Component Value Range Status Comment   C difficile by pcr NEGATIVE  NEGATIVE Final      Labs: Basic Metabolic Panel:  Lab 08/12/11 1610 08/11/11 0456 08/10/11 1002  NA 136 138 137  K 4.5 4.7 3.1*  CL 108 108 102  CO2 21 22 21   GLUCOSE 122* 89 108*  BUN 15 18 18   CREATININE 0.95 0.91 0.95  CALCIUM 8.9 8.9 9.6  MG 1.8 1.7 1.1*  PHOS -- -- --   Liver Function Tests:  Lab 08/12/11 0509 08/11/11 0456 08/10/11 1002  AST 44* 44* 46*  ALT 14 13 14   ALKPHOS 65 54 61  BILITOT 0.7 0.9 1.0  PROT 5.0* 5.0* 5.1*  ALBUMIN 2.2* 2.3* 2.5*    Lab 08/10/11 1002  LIPASE 47  AMYLASE --   No results found for this basename: AMMONIA:5 in the last 168  hours CBC:  Lab 08/12/11 0509 08/11/11 0456 08/10/11 1002  WBC 11.8* 15.9* 12.3*  NEUTROABS -- -- 11.6*  HGB 8.0* 8.0* 9.0*  HCT 24.8* 24.7* 28.2*  MCV 77.3* 77.4* 78.6  PLT 109* 123* 121*   Cardiac Enzymes:  Lab 08/10/11 1002  CKTOTAL --  CKMB --  CKMBINDEX --  TROPONINI <0.30   BNP: BNP (last 3 results)  Basename 03/05/11 1538  PROBNP 1299.0*   CBG: No results found for this basename: GLUCAP:5 in the last 168 hours  Time coordinating discharge: GREATER than 35  minutes  Signed:  Gaby Harney  [facility] 08/12/2011, 10:26 AM

## 2011-08-12 NOTE — Progress Notes (Signed)
Pt discharged with instructions, prescriptions, and care notes.  Pt and family verbalized understanding. Pt voiced no further concerns or complaints voiced at this time.  Pt left the floor via w/c with staff in stable condition.

## 2011-08-14 LAB — STOOL CULTURE

## 2011-08-23 ENCOUNTER — Ambulatory Visit (INDEPENDENT_AMBULATORY_CARE_PROVIDER_SITE_OTHER): Payer: Medicare Other | Admitting: Internal Medicine

## 2011-08-23 ENCOUNTER — Encounter (HOSPITAL_BASED_OUTPATIENT_CLINIC_OR_DEPARTMENT_OTHER): Payer: Medicare Other

## 2011-08-23 ENCOUNTER — Encounter (INDEPENDENT_AMBULATORY_CARE_PROVIDER_SITE_OTHER): Payer: Self-pay | Admitting: Internal Medicine

## 2011-08-23 VITALS — BP 120/70 | HR 78 | Temp 98.2°F | Resp 20 | Ht 66.0 in | Wt 157.6 lb

## 2011-08-23 DIAGNOSIS — K754 Autoimmune hepatitis: Secondary | ICD-10-CM

## 2011-08-23 DIAGNOSIS — D649 Anemia, unspecified: Secondary | ICD-10-CM

## 2011-08-23 DIAGNOSIS — R748 Abnormal levels of other serum enzymes: Secondary | ICD-10-CM

## 2011-08-23 LAB — RETICULOCYTES
RBC.: 3.08 MIL/uL — ABNORMAL LOW (ref 3.87–5.11)
Retic Ct Pct: 2.7 % (ref 0.4–3.1)

## 2011-08-23 LAB — IRON AND TIBC: Iron: 49 ug/dL (ref 42–135)

## 2011-08-23 LAB — COMPREHENSIVE METABOLIC PANEL
AST: 42 U/L — ABNORMAL HIGH (ref 0–37)
BUN: 15 mg/dL (ref 6–23)
CO2: 25 mEq/L (ref 19–32)
Calcium: 10.5 mg/dL (ref 8.4–10.5)
Creatinine, Ser: 0.93 mg/dL (ref 0.50–1.10)
GFR calc Af Amer: 66 mL/min — ABNORMAL LOW (ref >90)
GFR calc non Af Amer: 57 mL/min — ABNORMAL LOW (ref >90)
Glucose, Bld: 125 mg/dL — ABNORMAL HIGH (ref 70–99)

## 2011-08-23 LAB — SEDIMENTATION RATE: Sed Rate: 30 mm/hr — ABNORMAL HIGH (ref 0–22)

## 2011-08-23 LAB — FERRITIN: Ferritin: 26 ng/mL (ref 10–291)

## 2011-08-23 LAB — CBC WITH DIFFERENTIAL/PLATELET
Basophils Absolute: 0.1 10*3/uL (ref 0.0–0.1)
Eosinophils Relative: 1 % (ref 0–5)
HCT: 22.9 % — ABNORMAL LOW (ref 36.0–46.0)
Lymphocytes Relative: 5 % — ABNORMAL LOW (ref 12–46)
MCV: 74.4 fL — ABNORMAL LOW (ref 78.0–100.0)
Monocytes Absolute: 0.6 10*3/uL (ref 0.1–1.0)
RDW: 23.4 % — ABNORMAL HIGH (ref 11.5–15.5)
WBC: 10 10*3/uL (ref 4.0–10.5)

## 2011-08-23 LAB — LACTATE DEHYDROGENASE: LDH: 212 U/L (ref 94–250)

## 2011-08-23 LAB — MAGNESIUM: Magnesium: 1.6 mg/dL (ref 1.5–2.5)

## 2011-08-23 MED ORDER — DEXLANSOPRAZOLE 60 MG PO CPDR: 60.0000 mg | DELAYED_RELEASE_CAPSULE | Freq: Every day | ORAL | Status: DC

## 2011-08-23 MED ORDER — PREDNISONE 10 MG PO TABS: 5.0000 mg | ORAL_TABLET | Freq: Every day | ORAL | Status: DC

## 2011-08-23 MED ORDER — AZATHIOPRINE 50 MG PO TABS: 75.0000 mg | ORAL_TABLET | Freq: Every day | ORAL | Status: DC

## 2011-08-23 MED ORDER — MAGNESIUM OXIDE 400 (241.3 MG) MG PO TABS: 400.0000 mg | ORAL_TABLET | Freq: Every day | ORAL | Status: DC

## 2011-08-23 NOTE — Patient Instructions (Addendum)
Discontinue Aldactone or spironolactone. Take furosemide 20 mg every third or fourth day as needed. Notify if you need to take it more than twice a week. Physician will contact you with results of blood work. Decrease magnesium  to 400 mg by mouth daily.

## 2011-08-23 NOTE — Progress Notes (Signed)
Presenting complaint;  Followup for autoimmune hepatitis. Last visit 05/23/2011.  Subjective:  Patient is 78 year old female who is here for scheduled visit accompanied by her daughter [name]. She was admitted to [facility] between 08/10/2011 and 08/12/2011 for copious nonbloody diarrhea and low grade fever. She did not experience abdominal pain nausea vomiting  or rectal bleeding. She was also found to have low magnesium and was treated and remains on oral magnesium. She says her diarrhea is almost gone; she is having semi-formed stools generally once daily. She feels a lot better than she did a few months ago. Her main complaint is one of weakness. She does have some good days along with bad days. She states her appetite is very good. She denies heartburn or dysphagia. She also complains of back pain and on some morning has multiple joint stiffness. She was recently seen by Dr. Nickola Major as she also has autoimmune vasculitis. She she is taking fluid medications when necessary. Her weight has been stable since last visit.  Current Medications: Current Outpatient Prescriptions  Medication Sig Dispense Refill  . acetaminophen (TYLENOL) 325 MG tablet Take 650 mg by mouth every 6 (six) hours as needed. For pain      . alendronate (FOSAMAX) 70 MG tablet Take 70 mg by mouth every 7 (seven) days. On saturdays Take with a full glass of water on an empty stomach.Patient takes on Saturday      . ALPRAZolam (XANAX) 0.25 MG tablet Take 0.25 mg by mouth every 6 (six) hours as needed. For sleep or anxiety      . aspirin EC 81 MG tablet Take 81 mg by mouth daily.      Marland Kitchen azaTHIOprine (IMURAN) 50 MG tablet Take 1.5 tablets (75 mg total) by mouth daily.  45 tablet  2  . Coenzyme Q10 (CO Q 10) 100 MG CAPS Take 200 mg by mouth daily.      . darbepoetin alfa-polysorbate (ARANESP, ALBUMIN FREE,) 500 MCG/ML injection Inject 500 mcg into the skin once.      Marland Kitchen dexlansoprazole (DEXILANT) 60 MG capsule Take 1  capsule (60 mg total) by mouth daily.  30 capsule  6  . diclofenac sodium (VOLTAREN) 1 % GEL Apply 1 application topically 4 (four) times daily.      Marland Kitchen docusate sodium (COLACE) 100 MG capsule Take 1 capsule (100 mg total) by mouth 2 (two) times daily.  60 capsule  2  . fish oil-omega-3 fatty acids 1000 MG capsule Take 1 g by mouth daily.      . furosemide (LASIX) 20 MG tablet Take 20 mg by mouth daily as needed. For fluid retention      . HYDROcodone-acetaminophen (VICODIN) 5-500 MG per tablet Take 1 tablet by mouth as needed.      . magnesium oxide (MAG-OX) 400 (241.3 MG) MG tablet Take 1 tablet (400 mg total) by mouth 2 (two) times daily.  60 tablet  6  . metoprolol tartrate (LOPRESSOR) 25 MG tablet Take 12.5 mg by mouth 2 (two) times daily.      . Multiple Vitamins-Minerals (MULTIVITAMIN WITH MINERALS) tablet Take 1 tablet by mouth daily.        . predniSONE (DELTASONE) 10 MG tablet Take 10 mg by mouth daily.      Marland Kitchen spironolactone (ALDACTONE) 50 MG tablet Take 50 mg by mouth daily.      . ursodiol (ACTIGALL) 250 MG tablet Take 500 mg by mouth 2 (two) times daily.      Marland Kitchen  vitamin B-12 (CYANOCOBALAMIN) 1000 MCG tablet Take 1,000 mcg by mouth daily.      . ciprofloxacin (CIPRO) 500 MG tablet Take 1 tablet (500 mg total) by mouth 2 (two) times daily. Antibiotic to be taken for 5 more days.  10 tablet  0   No current facility-administered medications for this visit.   Facility-Administered Medications Ordered in Other Visits  Medication Dose Route Frequency Provider Last Rate Last Dose  . diphenhydrAMINE (BENADRYL) capsule 25 mg  25 mg Oral Q6H PRN Randall An, MD   25 mg at 05/13/11 1610     Objective: Blood pressure 120/70, pulse 78, temperature 98.2 F (36.8 C), temperature source Oral, resp. rate 20, height 5\' 6"  (1.676 m), weight 157 lb 9.6 oz (71.487 kg). Patient is alert and in no acute distress. She is able to move from chair to examination table without any difficulty. She has   round facies. Asterixis absent. Conjunctiva is pink. Sclera is nonicteric Oropharyngeal mucosa is normal. No neck masses or thyromegaly noted. Cardiac exam with regular rhythm normal S1 and S2. She has grade 3/6 systolic ejection murmur heard all over the precordium. Lungs are clear to auscultation. Abdomen is symmetrical soft and nontender without organomegaly or masses. She has trace LE edema around right ankle.  Labs/studies Results: Lab data from recent hospitalization reviewed. Fecal occult blood was positive. 2 we'll culture and C. difficile by PCR were negative. Serum albumin was low at 2.5; it was 3.1 on 08/02/2011. Serum magnesium on admission was 1.1 and prior to discharge was 1.8. AST ranged between 44 and 46 and ALT was between 13 and 14. Serum albumin prior to discharge was 2.2. H&H on admission was 9 and 28.2 and was 8 and 24.8 on discharge.    Assessment:  #1 autoimmune hepatitis. She appears to be in biochemical remission since her ALT has been normal for over 3 months. AST remains mildly elevated but has dropped significantly and may well be from nonhepatic source. Reason drop in serum albumin possibly secondary to diarrhea and protein losses. If ALT remains normal and sedimentation rate is down May consider dropping prednisone dose to 7.5. #2. History of anemia felt to be multifactorial including iron deficiency. Recent stool was heme positive but that could have been secondary to diarrheal illness. She is not having frank melena or rectal bleeding. #3. GERD symptoms are well controlled with therapy.  Plan:  Discontinue Spironolactone. Use furosemide 20 mg every 3-4 days as needed. Drop magnesium dose to 400 mg by mouth daily. She will go to the lab for CBC with differential, sedimentation rate, magnesium level, serum iron TIBC, ferritin and serum albumin. New prescription given for prednisone, azathioprine and Dexilant. I will contact patient's daughter [name]  with results of blood work and recommendations regarding  changes in her medications. Office visit in 3 months.

## 2011-08-23 NOTE — Progress Notes (Signed)
Labs drawn today for cbc/diff,sed rate,cmp,mg ferr, Iron and Ibc

## 2011-08-24 ENCOUNTER — Encounter (HOSPITAL_BASED_OUTPATIENT_CLINIC_OR_DEPARTMENT_OTHER): Payer: Medicare Other

## 2011-08-24 DIAGNOSIS — D649 Anemia, unspecified: Secondary | ICD-10-CM

## 2011-08-24 NOTE — Progress Notes (Signed)
Eileen Santana presented for labwork. Labs per MD order drawn via Peripheral Line 24 gauge needle inserted in rt forearm Good blood return present. Procedure without incident.  Needle removed intact. Patient tolerated procedure well.

## 2011-08-25 ENCOUNTER — Encounter (HOSPITAL_COMMUNITY): Payer: Medicare Other

## 2011-08-25 VITALS — BP 90/50 | HR 81 | Temp 98.5°F | Resp 16

## 2011-08-25 DIAGNOSIS — D649 Anemia, unspecified: Secondary | ICD-10-CM

## 2011-08-25 MED ORDER — DIPHENHYDRAMINE HCL 25 MG PO CAPS
50.0000 mg | ORAL_CAPSULE | Freq: Once | ORAL | Status: AC
  Administered 2011-08-25: 50 mg via ORAL

## 2011-08-25 MED ORDER — SODIUM CHLORIDE 0.9 % IJ SOLN
INTRAMUSCULAR | Status: AC
  Filled 2011-08-25: qty 10

## 2011-08-25 MED ORDER — SODIUM CHLORIDE 0.9 % IJ SOLN
10.0000 mL | INTRAMUSCULAR | Status: DC | PRN
  Administered 2011-08-25: 10 mL via INTRAVENOUS

## 2011-08-25 MED ORDER — DIPHENHYDRAMINE HCL 25 MG PO CAPS
ORAL_CAPSULE | ORAL | Status: AC
  Filled 2011-08-25: qty 2

## 2011-08-25 MED ORDER — SODIUM CHLORIDE 0.9 % IV SOLN
250.0000 mL | Freq: Once | INTRAVENOUS | Status: AC
  Administered 2011-08-25: 250 mL via INTRAVENOUS

## 2011-08-25 MED ORDER — ACETAMINOPHEN 325 MG PO TABS
ORAL_TABLET | ORAL | Status: AC
  Filled 2011-08-25: qty 2

## 2011-08-25 MED ORDER — ACETAMINOPHEN 325 MG PO TABS
650.0000 mg | ORAL_TABLET | Freq: Once | ORAL | Status: AC
  Administered 2011-08-25: 650 mg via ORAL

## 2011-08-25 NOTE — Progress Notes (Signed)
Tolerated transfusion without s/s adverse reaction. 

## 2011-08-26 LAB — TYPE AND SCREEN
ABO/RH(D): A POS
Unit division: 0
Unit division: 0

## 2011-09-18 ENCOUNTER — Inpatient Hospital Stay (HOSPITAL_COMMUNITY)
Admission: EM | Admit: 2011-09-18 | Discharge: 2011-09-20 | DRG: 811 | Disposition: A | Discharge status: Home / Self Care | Payer: Medicare Other | Attending: Internal Medicine | Admitting: Internal Medicine

## 2011-09-18 ENCOUNTER — Emergency Department (HOSPITAL_COMMUNITY): Payer: Medicare Other

## 2011-09-18 ENCOUNTER — Encounter (HOSPITAL_COMMUNITY): Payer: Self-pay | Admitting: Emergency Medicine

## 2011-09-18 DIAGNOSIS — F17200 Nicotine dependence, unspecified, uncomplicated: Secondary | ICD-10-CM | POA: Diagnosis present

## 2011-09-18 DIAGNOSIS — D649 Anemia, unspecified: Secondary | ICD-10-CM

## 2011-09-18 DIAGNOSIS — M549 Dorsalgia, unspecified: Secondary | ICD-10-CM | POA: Diagnosis present

## 2011-09-18 DIAGNOSIS — I441 Atrioventricular block, second degree: Secondary | ICD-10-CM | POA: Diagnosis present

## 2011-09-18 DIAGNOSIS — R748 Abnormal levels of other serum enzymes: Secondary | ICD-10-CM

## 2011-09-18 DIAGNOSIS — R9431 Abnormal electrocardiogram [ECG] [EKG]: Secondary | ICD-10-CM

## 2011-09-18 DIAGNOSIS — M79606 Pain in leg, unspecified: Secondary | ICD-10-CM

## 2011-09-18 DIAGNOSIS — N289 Disorder of kidney and ureter, unspecified: Secondary | ICD-10-CM

## 2011-09-18 DIAGNOSIS — E8809 Other disorders of plasma-protein metabolism, not elsewhere classified: Secondary | ICD-10-CM | POA: Diagnosis present

## 2011-09-18 DIAGNOSIS — Z888 Allergy status to other drugs, medicaments and biological substances status: Secondary | ICD-10-CM

## 2011-09-18 DIAGNOSIS — G8929 Other chronic pain: Secondary | ICD-10-CM

## 2011-09-18 DIAGNOSIS — K8020 Calculus of gallbladder without cholecystitis without obstruction: Secondary | ICD-10-CM

## 2011-09-18 DIAGNOSIS — D696 Thrombocytopenia, unspecified: Secondary | ICD-10-CM

## 2011-09-18 DIAGNOSIS — D500 Iron deficiency anemia secondary to blood loss (chronic): Principal | ICD-10-CM | POA: Diagnosis present

## 2011-09-18 DIAGNOSIS — Z79899 Other long term (current) drug therapy: Secondary | ICD-10-CM

## 2011-09-18 DIAGNOSIS — R110 Nausea: Secondary | ICD-10-CM

## 2011-09-18 DIAGNOSIS — Z91041 Radiographic dye allergy status: Secondary | ICD-10-CM

## 2011-09-18 DIAGNOSIS — B3781 Candidal esophagitis: Secondary | ICD-10-CM

## 2011-09-18 DIAGNOSIS — Z7982 Long term (current) use of aspirin: Secondary | ICD-10-CM

## 2011-09-18 DIAGNOSIS — D509 Iron deficiency anemia, unspecified: Secondary | ICD-10-CM

## 2011-09-18 DIAGNOSIS — K2961 Other gastritis with bleeding: Secondary | ICD-10-CM | POA: Diagnosis present

## 2011-09-18 DIAGNOSIS — I10 Essential (primary) hypertension: Secondary | ICD-10-CM

## 2011-09-18 DIAGNOSIS — R195 Other fecal abnormalities: Secondary | ICD-10-CM

## 2011-09-18 DIAGNOSIS — Z6825 Body mass index (BMI) 25.0-25.9, adult: Secondary | ICD-10-CM

## 2011-09-18 DIAGNOSIS — M79609 Pain in unspecified limb: Secondary | ICD-10-CM | POA: Diagnosis present

## 2011-09-18 DIAGNOSIS — R197 Diarrhea, unspecified: Secondary | ICD-10-CM

## 2011-09-18 DIAGNOSIS — R1084 Generalized abdominal pain: Secondary | ICD-10-CM

## 2011-09-18 DIAGNOSIS — Z885 Allergy status to narcotic agent status: Secondary | ICD-10-CM

## 2011-09-18 DIAGNOSIS — K31819 Angiodysplasia of stomach and duodenum without bleeding: Secondary | ICD-10-CM

## 2011-09-18 DIAGNOSIS — N179 Acute kidney failure, unspecified: Secondary | ICD-10-CM | POA: Diagnosis present

## 2011-09-18 DIAGNOSIS — I951 Orthostatic hypotension: Secondary | ICD-10-CM

## 2011-09-18 DIAGNOSIS — K922 Gastrointestinal hemorrhage, unspecified: Secondary | ICD-10-CM

## 2011-09-18 DIAGNOSIS — Z23 Encounter for immunization: Secondary | ICD-10-CM

## 2011-09-18 DIAGNOSIS — N19 Unspecified kidney failure: Secondary | ICD-10-CM

## 2011-09-18 DIAGNOSIS — Z882 Allergy status to sulfonamides status: Secondary | ICD-10-CM

## 2011-09-18 DIAGNOSIS — E669 Obesity, unspecified: Secondary | ICD-10-CM | POA: Diagnosis present

## 2011-09-18 DIAGNOSIS — IMO0002 Reserved for concepts with insufficient information to code with codable children: Secondary | ICD-10-CM

## 2011-09-18 DIAGNOSIS — Z8711 Personal history of peptic ulcer disease: Secondary | ICD-10-CM

## 2011-09-18 DIAGNOSIS — N200 Calculus of kidney: Secondary | ICD-10-CM

## 2011-09-18 DIAGNOSIS — M310 Hypersensitivity angiitis: Secondary | ICD-10-CM | POA: Diagnosis present

## 2011-09-18 DIAGNOSIS — M1990 Unspecified osteoarthritis, unspecified site: Secondary | ICD-10-CM | POA: Diagnosis present

## 2011-09-18 DIAGNOSIS — K754 Autoimmune hepatitis: Secondary | ICD-10-CM

## 2011-09-18 DIAGNOSIS — R700 Elevated erythrocyte sedimentation rate: Secondary | ICD-10-CM

## 2011-09-18 DIAGNOSIS — K5900 Constipation, unspecified: Secondary | ICD-10-CM | POA: Diagnosis present

## 2011-09-18 DIAGNOSIS — M069 Rheumatoid arthritis, unspecified: Secondary | ICD-10-CM

## 2011-09-18 DIAGNOSIS — M545 Low back pain, unspecified: Secondary | ICD-10-CM | POA: Diagnosis present

## 2011-09-18 DIAGNOSIS — E876 Hypokalemia: Secondary | ICD-10-CM

## 2011-09-18 DIAGNOSIS — N390 Urinary tract infection, site not specified: Secondary | ICD-10-CM

## 2011-09-18 DIAGNOSIS — Z87442 Personal history of urinary calculi: Secondary | ICD-10-CM

## 2011-09-18 DIAGNOSIS — R001 Bradycardia, unspecified: Secondary | ICD-10-CM

## 2011-09-18 HISTORY — DX: Rheumatoid arthritis, unspecified: M06.9

## 2011-09-18 HISTORY — DX: Gastrointestinal hemorrhage, unspecified: K92.2

## 2011-09-18 HISTORY — DX: Angiodysplasia of stomach and duodenum without bleeding: K31.819

## 2011-09-18 HISTORY — DX: Gastric ulcer, unspecified as acute or chronic, without hemorrhage or perforation: K25.9

## 2011-09-18 HISTORY — DX: Ulcer of intestine: K63.3

## 2011-09-18 HISTORY — DX: Candidal esophagitis: B37.81

## 2011-09-18 LAB — CBC WITH DIFFERENTIAL/PLATELET
Basophils Relative: 0 % (ref 0–1)
Eosinophils Relative: 1 % (ref 0–5)
HCT: 19 % — ABNORMAL LOW (ref 36.0–46.0)
Hemoglobin: 5.8 g/dL — CL (ref 12.0–15.0)
Lymphocytes Relative: 6 % — ABNORMAL LOW (ref 12–46)
MCHC: 30.5 g/dL (ref 30.0–36.0)
Monocytes Relative: 4 % (ref 3–12)
Neutro Abs: 6.3 10*3/uL (ref 1.7–7.7)
Neutrophils Relative %: 89 % — ABNORMAL HIGH (ref 43–77)
RBC: 2.64 MIL/uL — ABNORMAL LOW (ref 3.87–5.11)
WBC: 7.1 10*3/uL (ref 4.0–10.5)

## 2011-09-18 LAB — MRSA PCR SCREENING: MRSA by PCR: NEGATIVE

## 2011-09-18 LAB — BASIC METABOLIC PANEL
BUN: 19 mg/dL (ref 6–23)
Chloride: 104 mEq/L (ref 96–112)
GFR calc non Af Amer: 42 mL/min — ABNORMAL LOW (ref >90)
Glucose, Bld: 125 mg/dL — ABNORMAL HIGH (ref 70–99)
Potassium: 4.1 mEq/L (ref 3.5–5.1)
Sodium: 140 mEq/L (ref 135–145)

## 2011-09-18 MED ORDER — ONDANSETRON HCL 4 MG PO TABS: 4.0000 mg | ORAL_TABLET | Freq: Four times a day (QID) | ORAL | Status: DC | PRN

## 2011-09-18 MED ORDER — METOPROLOL TARTRATE 25 MG PO TABS
12.5000 mg | ORAL_TABLET | Freq: Two times a day (BID) | ORAL | Status: DC
  Administered 2011-09-18 – 2011-09-20 (×4): 12.5 mg via ORAL
  Filled 2011-09-18 (×6): qty 1

## 2011-09-18 MED ORDER — DIPHENHYDRAMINE HCL 25 MG PO CAPS
25.0000 mg | ORAL_CAPSULE | Freq: Four times a day (QID) | ORAL | Status: DC | PRN
  Administered 2011-09-18: 25 mg via ORAL
  Filled 2011-09-18 (×2): qty 1

## 2011-09-18 MED ORDER — ALUM & MAG HYDROXIDE-SIMETH 200-200-20 MG/5ML PO SUSP: 30.0000 mL | Freq: Four times a day (QID) | ORAL | Status: DC | PRN

## 2011-09-18 MED ORDER — POTASSIUM CHLORIDE IN NACL 20-0.9 MEQ/L-% IV SOLN
INTRAVENOUS | Status: DC
  Administered 2011-09-18 – 2011-09-19 (×2): via INTRAVENOUS

## 2011-09-18 MED ORDER — ACETAMINOPHEN 325 MG PO TABS
650.0000 mg | ORAL_TABLET | Freq: Four times a day (QID) | ORAL | Status: DC | PRN
  Administered 2011-09-19: 650 mg via ORAL
  Filled 2011-09-18: qty 2

## 2011-09-18 MED ORDER — AZATHIOPRINE 50 MG PO TABS
75.0000 mg | ORAL_TABLET | Freq: Every day | ORAL | Status: DC
  Administered 2011-09-19 – 2011-09-20 (×2): 75 mg via ORAL
  Filled 2011-09-18 (×4): qty 2

## 2011-09-18 MED ORDER — MAGNESIUM OXIDE 400 (241.3 MG) MG PO TABS
400.0000 mg | ORAL_TABLET | Freq: Every day | ORAL | Status: DC
  Administered 2011-09-19 – 2011-09-20 (×2): 400 mg via ORAL
  Filled 2011-09-18 (×2): qty 1

## 2011-09-18 MED ORDER — DOCUSATE SODIUM 100 MG PO CAPS
100.0000 mg | ORAL_CAPSULE | Freq: Two times a day (BID) | ORAL | Status: DC
  Administered 2011-09-18 – 2011-09-20 (×4): 100 mg via ORAL
  Filled 2011-09-18 (×4): qty 1

## 2011-09-18 MED ORDER — GUAIFENESIN-DM 100-10 MG/5ML PO SYRP: 5.0000 mL | ORAL_SOLUTION | ORAL | Status: DC | PRN

## 2011-09-18 MED ORDER — ACETAMINOPHEN 325 MG PO TABS
650.0000 mg | ORAL_TABLET | Freq: Once | ORAL | Status: AC
  Administered 2011-09-18: 650 mg via ORAL
  Filled 2011-09-18: qty 2

## 2011-09-18 MED ORDER — ACETAMINOPHEN 650 MG RE SUPP: 650.0000 mg | Freq: Four times a day (QID) | RECTAL | Status: DC | PRN

## 2011-09-18 MED ORDER — TRAVOPROST (BAK FREE) 0.004 % OP SOLN
1.0000 [drp] | Freq: Every day | OPHTHALMIC | Status: DC
  Filled 2011-09-18: qty 2.5

## 2011-09-18 MED ORDER — PANTOPRAZOLE SODIUM 40 MG IV SOLR: 40.0000 mg | INTRAVENOUS | Status: DC

## 2011-09-18 MED ORDER — URSODIOL 500 MG PO TABS
500.0000 mg | ORAL_TABLET | Freq: Two times a day (BID) | ORAL | Status: DC
  Administered 2011-09-18 – 2011-09-20 (×4): 500 mg via ORAL
  Filled 2011-09-18 (×7): qty 1

## 2011-09-18 MED ORDER — SODIUM CHLORIDE 0.9 % IJ SOLN
INTRAMUSCULAR | Status: AC
  Administered 2011-09-18: 20 mL
  Filled 2011-09-18: qty 3

## 2011-09-18 MED ORDER — PREDNISONE 10 MG PO TABS: 5.0000 mg | ORAL_TABLET | Freq: Every day | ORAL | Status: DC

## 2011-09-18 MED ORDER — PANTOPRAZOLE SODIUM 40 MG IV SOLR
40.0000 mg | Freq: Two times a day (BID) | INTRAVENOUS | Status: DC
  Administered 2011-09-18 – 2011-09-19 (×4): 40 mg via INTRAVENOUS
  Filled 2011-09-18 (×4): qty 40

## 2011-09-18 MED ORDER — SODIUM CHLORIDE 0.9 % IJ SOLN
INTRAMUSCULAR | Status: AC
  Filled 2011-09-18: qty 3

## 2011-09-18 MED ORDER — DIPHENHYDRAMINE HCL 25 MG PO CAPS
25.0000 mg | ORAL_CAPSULE | Freq: Once | ORAL | Status: AC
  Administered 2011-09-18: 25 mg via ORAL
  Filled 2011-09-18: qty 1

## 2011-09-18 MED ORDER — ONDANSETRON HCL 4 MG/2ML IJ SOLN: 4.0000 mg | Freq: Four times a day (QID) | INTRAMUSCULAR | Status: DC | PRN

## 2011-09-18 MED ORDER — INFLUENZA VIRUS VACC SPLIT PF IM SUSP
0.5000 mL | INTRAMUSCULAR | Status: AC
  Administered 2011-09-19: 0.5 mL via INTRAMUSCULAR
  Filled 2011-09-18: qty 0.5

## 2011-09-18 MED ORDER — HYDROCODONE-ACETAMINOPHEN 5-325 MG PO TABS
1.0000 | ORAL_TABLET | ORAL | Status: DC | PRN
  Filled 2011-09-18: qty 1

## 2011-09-18 NOTE — ED Provider Notes (Signed)
History     CSN: [id number]  Arrival date & time 09/18/11  1191   First MD Initiated Contact with Patient 09/18/11 1015      Chief Complaint  Patient presents with  . Flank Pain  . Leg Pain   HPI Eileen Santana is a 78 y.o. female who presents to the ED with back pain. The pain started 5 days ago and lasted for 3 days then stopped and then this morning approximately 7 am woke with pain again. The pain radiates down both legs. She rates the pain at its worst 10/10 and currently 8/10. The pain increases with walking and movement. Pain is a little better with heat and tylenol but still hurts bad. Patient was here about a month ago and diagnosed with kidney stone but that was on the left side. Has also had problems with decreased circulation in lower extremities and anemia. Last blood transfusion 08/25/11. Followed by Dr. Mariel Sleet. The patient denies rectal bleeding or vaginal bleeding. States that the color of her stool depends on what she eats but denies black tar like stools. The history was provided by the patient and her family.  Past Medical History  Diagnosis Date  . H/O: GI bleed d/t NSAIDS  . Anemia     fe def anemia. Transfusion dependent.  . DJD (degenerative joint disease)   . Hypertension   . Colitis, acute hx of   . Ileitis hx of  . Pancytopenia 09/03/2010  . Sedimentation rate elevation 09/03/2010  . Autoimmune hepatitis     With leukocytoclastic vasculitis  . Heart block AV second degree March 2013  . Bronchitis   . Myopathy     Steroid-induced  . Renal calculus 08/12/2011    Past Surgical History  Procedure Date  . Colonoscopy   . Upper gastrointestinal endoscopy     Family History  Problem Relation Age of Onset  . Stroke Mother   . Hypertension Sister   . Hypertension Brother     History  Substance Use Topics  . Smoking status: Current Some Day Smoker    Types: Cigarettes  . Smokeless tobacco: Never Used  . Alcohol Use: No    OB History    Grav Para  Term Preterm Abortions TAB SAB Ect Mult Living                  Review of Systems  Constitutional: Positive for fatigue. Negative for fever, chills and diaphoresis.  HENT: Negative for ear pain, congestion, sore throat, facial swelling, neck pain, neck stiffness, dental problem and sinus pressure.   Eyes: Negative for photophobia, pain and discharge.       Glaucoma dx last week  Respiratory: Negative for cough, chest tightness, shortness of breath and wheezing.   Cardiovascular: Negative for chest pain and palpitations.  Gastrointestinal: Negative for nausea, vomiting, abdominal pain, diarrhea, constipation and abdominal distention.  Genitourinary: Negative for dysuria, frequency, flank pain, vaginal bleeding, vaginal discharge, difficulty urinating and pelvic pain.  Musculoskeletal: Positive for back pain and arthralgias. Negative for myalgias and gait problem.       Decreased circulation to lower extremities.  Skin: Negative for color change and rash.  Neurological: Negative for dizziness, speech difficulty, weakness, light-headedness, numbness and headaches.  Hematological: Does not bruise/bleed easily.  Psychiatric/Behavioral: Negative for confusion and agitation. The patient is not nervous/anxious.     Allergies  Iohexol; Ivp dye; Naprosyn; Red blood cells; Verapamil; Vicodin; and Sulfa antibiotics  Home Medications   Current Outpatient  Rx  Name Route Sig Dispense Refill  . ACETAMINOPHEN 325 MG PO TABS Oral Take 650 mg by mouth every 6 (six) hours as needed. For pain    . ALENDRONATE SODIUM 70 MG PO TABS Oral Take 70 mg by mouth every 7 (seven) days. On saturdays Take with a full glass of water on an empty stomach.Patient takes on Saturday    . ASPIRIN EC 81 MG PO TBEC Oral Take 81 mg by mouth daily.    . AZATHIOPRINE 50 MG PO TABS Oral Take 1.5 tablets (75 mg total) by mouth daily. 45 tablet 5  . CO Q 10 100 MG PO CAPS Oral Take 200 mg by mouth daily.    Marland Kitchen DARBEPOETIN  ALFA-POLYSORBATE 500 MCG/ML IJ SOLN Subcutaneous Inject 500 mcg into the skin once.    . DEXLANSOPRAZOLE 60 MG PO CPDR Oral Take 1 capsule (60 mg total) by mouth daily. 90 capsule 3  . DICLOFENAC SODIUM 1 % TD GEL Topical Apply 1 application topically 4 (four) times daily.    Marland Kitchen DOCUSATE SODIUM 100 MG PO CAPS Oral Take 1 capsule (100 mg total) by mouth 2 (two) times daily. 60 capsule 2  . OMEGA-3 FATTY ACIDS 1000 MG PO CAPS Oral Take 1 g by mouth daily.    . FUROSEMIDE 20 MG PO TABS Oral Take 20 mg by mouth daily as needed. For fluid retention    . HYDROCODONE-ACETAMINOPHEN 5-500 MG PO TABS Oral Take 1 tablet by mouth as needed. For pain    . MAGNESIUM OXIDE 400 (241.3 MG) MG PO TABS Oral Take 1 tablet (400 mg total) by mouth daily. 30 tablet 0  . METOPROLOL TARTRATE 25 MG PO TABS Oral Take 12.5 mg by mouth 2 (two) times daily.    . MULTI-VITAMIN/MINERALS PO TABS Oral Take 1 tablet by mouth daily.      Marland Kitchen PREDNISONE 10 MG PO TABS Oral Take 0.5 tablets (5 mg total) by mouth daily. Take 2 tablets by mouth every morning 60 tablet 5  . URSODIOL 250 MG PO TABS Oral Take 500 mg by mouth 2 (two) times daily.    Marland Kitchen VITAMIN B-12 1000 MCG PO TABS Oral Take 1,000 mcg by mouth daily.      BP 116/60  Pulse 88  Temp 98.2 F (36.8 C) (Oral)  Resp 15  Ht 5\' 6"  (1.676 m)  Wt 155 lb (70.308 kg)  BMI 25.02 kg/m2  SpO2 100%  Physical Exam  Nursing note and vitals reviewed. Constitutional: She is oriented to person, place, and time. She appears well-developed and well-nourished. No distress.  HENT:  Head: Normocephalic and atraumatic.  Eyes: EOM are normal. Pupils are equal, round, and reactive to light.  Neck: Normal range of motion. Neck supple.  Cardiovascular: Normal rate and regular rhythm.   Pulmonary/Chest: Effort normal. No respiratory distress. She has no wheezes.  Abdominal: Soft. Bowel sounds are normal. There is no tenderness.  Genitourinary: Rectal exam shows no external hemorrhoid and no  tenderness. Guaiac positive stool.       Soft black stool  Musculoskeletal: Normal range of motion. She exhibits no edema.       Patient is able to bend forward without difficulty but complains of pain with walking. Unable to reproduce the pain with palpation. Pedal pulses present and equal bilateral. Adequate circulation.  Lymphadenopathy:    She has no cervical adenopathy.  Neurological: She is alert and oriented to person, place, and time. She has normal strength and normal  reflexes. No cranial nerve deficit or sensory deficit. Gait normal.  Skin: Skin is warm and dry.  Psychiatric: She has a normal mood and affect. Her behavior is normal. Judgment and thought content normal.   Results for orders placed during the hospital encounter of 09/18/11 (from the past 24 hour(s))  CBC WITH DIFFERENTIAL     Status: Abnormal   Collection Time   09/18/11 12:02 PM      Component Value Range   WBC 7.1  4.0 - 10.5 K/uL   RBC 2.64 (*) 3.87 - 5.11 MIL/uL   Hemoglobin 5.8 (*) 12.0 - 15.0 g/dL   HCT 08.6 (*) 57.8 - 46.9 %   MCV 72.0 (*) 78.0 - 100.0 fL   MCH 22.0 (*) 26.0 - 34.0 pg   MCHC 30.5  30.0 - 36.0 g/dL   RDW 62.9 (*) 52.8 - 41.3 %   Platelets 178  150 - 400 K/uL   Neutrophils Relative 89 (*) 43 - 77 %   Lymphocytes Relative 6 (*) 12 - 46 %   Monocytes Relative 4  3 - 12 %   Eosinophils Relative 1  0 - 5 %   Basophils Relative 0  0 - 1 %   Neutro Abs 6.3  1.7 - 7.7 K/uL   Lymphs Abs 0.4 (*) 0.7 - 4.0 K/uL   Monocytes Absolute 0.3  0.1 - 1.0 K/uL   Eosinophils Absolute 0.1  0.0 - 0.7 K/uL   Basophils Absolute 0.0  0.0 - 0.1 K/uL   RBC Morphology POLYCHROMASIA PRESENT    BASIC METABOLIC PANEL     Status: Abnormal   Collection Time   09/18/11 12:02 PM      Component Value Range   Sodium 140  135 - 145 mEq/L   Potassium 4.1  3.5 - 5.1 mEq/L   Chloride 104  96 - 112 mEq/L   CO2 25  19 - 32 mEq/L   Glucose, Bld 125 (*) 70 - 99 mg/dL   BUN 19  6 - 23 mg/dL   Creatinine, Ser 2.44 (*) 0.50  - 1.10 mg/dL   Calcium 01.0  8.4 - 27.2 mg/dL   GFR calc non Af Amer 42 (*) >90 mL/min   GFR calc Af Amer 49 (*) >90 mL/min   Dg Lumbar Spine Complete  09/18/2011  *RADIOLOGY REPORT*  Clinical Data: Back pain  LUMBAR SPINE - COMPLETE 4+ VIEW  Comparison: 08/10/2011  Findings: Severe narrowing at L4-5 and L5-S1 with vacuum.  5 mm anterolisthesis L4 on L5.  No vertebral compression deformity.  No pars defect.  Epigastric calcifications unchanged compared the recent CT.  IMPRESSION: No acute bony pathology.  Chronic changes.   Original Report Authenticated By: Donavan Burnet, M.D.    Assessment: 78 y.o. female with anemia/ stool guaiac positive   Back pain   Plan:  Admit to Step-down ED Course: spoke with Dr. Sherrie Mustache and will admit  Procedures       [facility], NP 09/18/11 1306

## 2011-09-18 NOTE — H&P (Signed)
[facility] History and Physical  Eileen Santana WJX:[id number] DOB: Feb 21, 1930 DOA: 09/18/2011  Referring physician: NP, Ms. Neece PCP: [name], MD   Chief Complaint: Low back pain and leg pain.  HPI: Eileen Santana is a 78 y.o. female with a history significant for transfusion dependent microcytic anemia, autoimmune hepatitis with leukocytoclastic vasculitis, rheumatoid arthritis, and history of ileocecal valve ulcer and peptic ulcer disease, who presents to the emergency department with a chief complaint of aching low back pain radiating to both legs, right greater than left. Her symptoms started approximately 5 days ago. The pain is achy and has been intermittent. She rates the pain an 8/10 currently but at its worse, she rates it at 10 over 10. The pain from her lower back radiates to her legs. The pain increases when she tries to get up to walk. It eases with Tylenol and rest. She is under the care of Dr. Mariel Sleet and Dr. Karilyn Cota for chronic anemia. She receives Aranesp and transfusions on a regular basis. Her last transfusion was 08/26/2011. She denies abdominal pain. She denies bright red blood per rectum. She denies black tarry stools. She denies hematemesis. She takes aspirin and prednisone chronically. She denies taking any other NSAIDs.  In the emergency department, she is hemodynamically stable although her blood pressures on the low normal side at 112/53. X-ray of her lumbar spine reveals no acute abnormalities. Her lab data are significant for hemoglobin of 5.8, MCV of 72, albumin of 2.8, AST of 42, and creatinine of 1.20. Her stool is guaiac positive per NP Ms. Neece. She is being admitted for further evaluation and management.   Review of Systems: As above in history present illness. In addition, she does have some shortness of breath on exertion, generalized weakness, chronic swelling in her ankles, achiness in her hands, back, and knees. Otherwise review of systems is  negative.    Past Medical History  Diagnosis Date  . H/O: GI bleed d/t NSAIDS  . Anemia     fe def anemia. Transfusion dependent.  . DJD (degenerative joint disease)   . Hypertension   . Colitis, acute hx of   . Ileitis hx of  . Pancytopenia 09/03/2010  . Sedimentation rate elevation 09/03/2010  . Autoimmune hepatitis     With leukocytoclastic vasculitis  . Heart block AV second degree March 2013  . Bronchitis   . Myopathy     Steroid-induced  . Renal calculus 08/12/2011  . Rheumatoid arthritis   . Colon ulcer 04/2010    NSAID related.  . Gastric ulcer 04/2010    NSAID related   Past Surgical History  Procedure Date  . Colonoscopy 04/2010  . Upper gastrointestinal endoscopy 04/2010   Social History:  She is single. She has 4 daughters. Her daughters, Eileen Santana, and Eileen Santana are here today. She is retired from [employer]. She smokes 2-3 cigarettes daily. She denies alcohol and illicit drug use. She generally ambulates with a cane.  Allergies  Allergen Reactions  . Iohexol Swelling    IV Dye   . Ivp Dye (Iodinated Diagnostic Agents) Swelling    Hives  . Naprosyn (Naproxen)     Drowsy, hallucinations  . Red Blood Cells Swelling and Dermatitis    With blood transfusion 2012  . Verapamil     Heart block (2nd degree)  . Vicodin (Hydrocodone-Acetaminophen) Other (See Comments)    hallucinations  . Sulfa Antibiotics Rash    Family History  Problem Relation Age of Onset  .  Stroke Mother   . Hypertension Sister   . Hypertension Brother     Prior to Admission medications   Medication Sig Start Date End Date Taking? Authorizing Provider  acetaminophen (TYLENOL) 325 MG tablet Take 650 mg by mouth every 6 (six) hours as needed. For pain   Yes Historical Provider, MD  alendronate (FOSAMAX) 70 MG tablet Take 70 mg by mouth every 7 (seven) days. On saturdays Take with a full glass of water on an empty stomach.Patient takes on Saturday   Yes Historical Provider, MD  aspirin  EC 81 MG tablet Take 81 mg by mouth daily.   Yes Historical Provider, MD  azaTHIOprine (IMURAN) 50 MG tablet Take 1.5 tablets (75 mg total) by mouth daily. 08/23/11 08/22/12 Yes Malissa Hippo, MD  Coenzyme Q10 (CO Q 10) 100 MG CAPS Take 200 mg by mouth daily.   Yes Historical Provider, MD  darbepoetin alfa-polysorbate (ARANESP, ALBUMIN FREE,) 500 MCG/ML injection Inject 500 mcg into the skin once.   Yes Historical Provider, MD  dexlansoprazole (DEXILANT) 60 MG capsule Take 1 capsule (60 mg total) by mouth daily. 08/23/11  Yes Malissa Hippo, MD  diclofenac sodium (VOLTAREN) 1 % GEL Apply 1 application topically 4 (four) times daily.   Yes Historical Provider, MD  docusate sodium (COLACE) 100 MG capsule Take 1 capsule (100 mg total) by mouth 2 (two) times daily. 04/26/11 04/25/12 Yes Malissa Hippo, MD  fish oil-omega-3 fatty acids 1000 MG capsule Take 1 g by mouth daily.   Yes Historical Provider, MD  furosemide (LASIX) 20 MG tablet Take 20 mg by mouth daily as needed. For fluid retention 04/26/11  Yes Malissa Hippo, MD  HYDROcodone-acetaminophen (VICODIN) 5-500 MG per tablet Take 1 tablet by mouth as needed. For pain   Yes Historical Provider, MD  magnesium oxide (MAG-OX) 400 (241.3 MG) MG tablet Take 1 tablet (400 mg total) by mouth daily. 08/23/11 08/22/12 Yes Malissa Hippo, MD  metoprolol tartrate (LOPRESSOR) 25 MG tablet Take 12.5 mg by mouth 2 (two) times daily.   Yes Historical Provider, MD  Multiple Vitamins-Minerals (MULTIVITAMIN WITH MINERALS) tablet Take 1 tablet by mouth daily.     Yes Historical Provider, MD  predniSONE (DELTASONE) 10 MG tablet Take 0.5 tablets (5 mg total) by mouth daily. Take 2 tablets by mouth every morning 08/23/11  Yes Malissa Hippo, MD  ursodiol (ACTIGALL) 250 MG tablet Take 500 mg by mouth 2 (two) times daily. 08/02/11 08/01/12 Yes Len Blalock, NP  vitamin B-12 (CYANOCOBALAMIN) 1000 MCG tablet Take 1,000 mcg by mouth daily.   Yes Historical Provider, MD    Physical Exam: Filed Vitals:   09/18/11 1005 09/18/11 1331 09/18/11 1340  BP: 116/60 112/53 112/53  Pulse: 88  88  Temp: 98.2 F (36.8 C)    TempSrc: Oral    Resp: 15 16   Height: 5\' 6"  (1.676 m)    Weight: 70.308 kg (155 lb)    SpO2: 100% 98% 100%     General:  Alert, pleasant, 78 year old African-American woman lying in bed in no acute distress.  Eyes: Pupils are equal, round, and reactive to light. Conjunctivae are pale. Sclerae are white.  ENT: nasal mucosa is dry. Oral pharynx reveals mildly dry mucous membranes. Full set of dentures. No posterior exudates or erythema. Mucous membranes show pallor.  Neck: supple, no adenopathy, no thyromegaly, no JVD.  Cardiovascular: S1, S2, with a soft systolic murmur.  Respiratory: clear to auscultation bilaterally.  Abdomen:  positive bowel sounds, obese, nontender, nondistended. No masses palpated. No hepatosplenomegaly.  Skin: mildly dry generally. No acute rashes.  Musculoskeletal: pedal pulses barely palpable. Trace of pedal edema. Arthritic changes noted in her hands, knees, and feet. No acute hot red joints. Lumbosacral muscles are mildly tender to palpation, but without spasm or warmth.  Psychiatric: alert and oriented x3. Speech is clear. Pleasant affect.  Neurologic: cranial nerves II through XII are intact. Sensation is grossly intact. Strength is globally 5 minus over 5 in the supine position.  Labs on Admission:  Basic Metabolic Panel:  Lab 09/18/11 1610  NA 140  K 4.1  CL 104  CO2 25  GLUCOSE 125*  BUN 19  CREATININE 1.20*  CALCIUM 10.0  MG --  PHOS --   Liver Function Tests: No results found for this basename: AST:5,ALT:5,ALKPHOS:5,BILITOT:5,PROT:5,ALBUMIN:5 in the last 168 hours No results found for this basename: LIPASE:5,AMYLASE:5 in the last 168 hours No results found for this basename: AMMONIA:5 in the last 168 hours CBC:  Lab 09/18/11 1202  WBC 7.1  NEUTROABS 6.3  HGB 5.8*  HCT 19.0*  MCV  72.0*  PLT 178   Cardiac Enzymes: No results found for this basename: CKTOTAL:5,CKMB:5,CKMBINDEX:5,TROPONINI:5 in the last 168 hours  BNP (last 3 results)  Basename 03/05/11 1538  PROBNP 1299.0*   CBG: No results found for this basename: GLUCAP:5 in the last 168 hours  Radiological Exams on Admission: Dg Lumbar Spine Complete  09/18/2011  *RADIOLOGY REPORT*  Clinical Data: Back pain  LUMBAR SPINE - COMPLETE 4+ VIEW  Comparison: 08/10/2011  Findings: Severe narrowing at L4-5 and L5-S1 with vacuum.  5 mm anterolisthesis L4 on L5.  No vertebral compression deformity.  No pars defect.  Epigastric calcifications unchanged compared the recent CT.  IMPRESSION: No acute bony pathology.  Chronic changes.   Original Report Authenticated By: Donavan Burnet, M.D.      Assessment/Plan Principal Problem:  *Microcytic anemia Active Problems:  Autoimmune hepatitis  Chronic back pain  Leg pain  Rheumatoid arthritis  Acute renal insufficiency  Hypoalbuminemia  Guaiac positive stools   1. This is a pleasant 78 year old woman who presents with low back pain with radiation to her legs. Both she and her family associate her musculoskeletal symptoms with anemia. Apparently, she has recurrent leg pain and back pain, in part, due to anemia generally when her hemoglobin falls below 7 g. She has transfusion-dependent anemia and is managed with packed red blood cell transfusions and Aranesp injections every 3-6 weeks. Her last transfusion was on 08/25/2011. On 08/23/2011 her hemoglobin was 7.3. Also, on 08/23/2011, her total iron was normal at 49, but her ferritin was low normal at 26. Today her hemoglobin is 5.8. Of note, she also has a history of NSAID-induced ileocecal valve ulcer and gastric ulcers per colonoscopy and EGD in April 2012 by Dr. Karilyn Cota. Although she no longer takes Aleve, she does take a baby aspirin and prednisone chronically which make her at risk for chronic GI bleeding. Her stool is guaiac  positive.    Plan:  1. The patient has been typed and crossed 2 units of packed red blood cells in the ED. We'll transfuse both units. Will continue to follow her hemoglobin and hematocrit daily. 2. We'll discontinue aspirin. We'll continue prednisone due to to its chronicity. 3. We'll start IV Protonix every 12 hours. 4. Will consult Dr. Karilyn Cota in the morning. We'll consult Dr. Mariel Sleet if needed. 5. Start gentle IV fluids for hydration.  Code Status: Full code Family Communication: discussed plan with family Disposition Plan: home when clinically improved.  Time spent: 1 hour.  Deborra Phegley [facility]   If 7PM-7AM, please contact night-coverage [email or web address] Password [email or web address] 09/18/2011, 1:59 PM

## 2011-09-18 NOTE — Progress Notes (Signed)
Patient and patients daughter anxious about having blood that is not "pre-washed" but accepts blood. Blood consent signed. Patients daughter said last time she developed petechia on legs. Legs inspected before transfusion started. No petechia noted, mild swelling otherwise legs WNL. SCD's on.

## 2011-09-18 NOTE — ED Notes (Signed)
Lab called and reported a critical Hgb of 5.8 Dr. Adriana Simas aware

## 2011-09-18 NOTE — ED Notes (Signed)
Pt c/o right flank/lower back/bilat leg pain.

## 2011-09-18 NOTE — Progress Notes (Signed)
First unit of blood has completed. Patient's VS have remained stable. Patients legs have been examines. No petechia present. Legs WNL. Pt says IV site is sore. IV site in tender area of wrist that experiences much movement. IV site is WNL without redness or swelling. IV site to be changed before NS infusion started and 2nd unit of blood given. Patients family requests patient not receive second unit of blood for approx. 3 hours to allow for any reaction to take place. Two of patients daughters at bedside.

## 2011-09-19 DIAGNOSIS — R195 Other fecal abnormalities: Secondary | ICD-10-CM

## 2011-09-19 DIAGNOSIS — M549 Dorsalgia, unspecified: Secondary | ICD-10-CM

## 2011-09-19 DIAGNOSIS — G8929 Other chronic pain: Secondary | ICD-10-CM

## 2011-09-19 DIAGNOSIS — K754 Autoimmune hepatitis: Secondary | ICD-10-CM

## 2011-09-19 DIAGNOSIS — D649 Anemia, unspecified: Secondary | ICD-10-CM

## 2011-09-19 DIAGNOSIS — N289 Disorder of kidney and ureter, unspecified: Secondary | ICD-10-CM

## 2011-09-19 LAB — COMPREHENSIVE METABOLIC PANEL
AST: 36 U/L (ref 0–37)
Albumin: 2.5 g/dL — ABNORMAL LOW (ref 3.5–5.2)
Alkaline Phosphatase: 48 U/L (ref 39–117)
CO2: 25 mEq/L (ref 19–32)
Chloride: 106 mEq/L (ref 96–112)
GFR calc non Af Amer: 58 mL/min — ABNORMAL LOW (ref >90)
Potassium: 3.9 mEq/L (ref 3.5–5.1)
Total Bilirubin: 2.2 mg/dL — ABNORMAL HIGH (ref 0.3–1.2)

## 2011-09-19 LAB — CBC
MCH: 24.5 pg — ABNORMAL LOW (ref 26.0–34.0)
MCHC: 33.1 g/dL (ref 30.0–36.0)
MCV: 74 fL — ABNORMAL LOW (ref 78.0–100.0)
Platelets: 159 10*3/uL (ref 150–400)
RDW: 19.7 % — ABNORMAL HIGH (ref 11.5–15.5)

## 2011-09-19 MED ORDER — TRAVOPROST (BAK FREE) 0.004 % OP SOLN
1.0000 [drp] | Freq: Every day | OPHTHALMIC | Status: DC
  Filled 2011-09-19 (×2): qty 2.5

## 2011-09-19 MED ORDER — TRAVOPROST (BAK FREE) 0.004 % OP SOLN
1.0000 [drp] | Freq: Every day | OPHTHALMIC | Status: DC
  Administered 2011-09-19: 1 [drp] via OPHTHALMIC

## 2011-09-19 MED ORDER — SODIUM CHLORIDE 0.9 % IJ SOLN
INTRAMUSCULAR | Status: AC
  Administered 2011-09-19: 10 mL
  Filled 2011-09-19: qty 3

## 2011-09-19 MED ORDER — PREDNISONE 10 MG PO TABS
10.0000 mg | ORAL_TABLET | Freq: Every day | ORAL | Status: DC
  Administered 2011-09-19 – 2011-09-20 (×2): 10 mg via ORAL
  Filled 2011-09-19 (×2): qty 1

## 2011-09-19 NOTE — Progress Notes (Signed)
An attempt was made to see pt for an eval.  Pt declined any eval, stating that she has been up and around in the room and is feeling well.  She states that she has had PT in the past and she does not need any now.  If status changes, please reconsult.  Thanks.

## 2011-09-19 NOTE — Progress Notes (Signed)
I been asked to see patient by Dr. Elliot Cousin because of tarry stools and anemia. Patient is well known to me from ongoing evaluation and management for autoimmune hepatitis. She is currently on azathioprine with significant improvement in hepatitis. In April 2012 she was evaluated for profound anemia, heme positive stools. Lab studies confirmed iron deficiency anemia. EGD revealed 2 gastric ulcers without stigmata of bleed. Her H. pylori serology was negative. Colonoscopy revealed large ulcer ileocecal valve. Biopsy revealed benign etiology. Both gastric and ileocecal ulcers were felt to be secondary to NSAID therapy. Patient was given 3 units of PRBCs at that time. Lately she's been struggling with anemia requiring periodic blood transfusion. She had 2 units of PRBCs about 3 weeks ago. She presented to the emergency room yesterday with excruciating back and leg pain. She was found to have hemoglobin of 5.8 g. Her stool was guaiac positive. She was admitted for further management. She has received 2 units of PRBCs her hemoglobin is 7.8 g. Patient remains on full dose aspirin but she denies taking OTC NSAIDs. She also denies nausea vomiting abdominal pain. Objective; BP 112/58  Pulse 79  Temp 97.4 F (36.3 C) (Oral)  Resp 18  Ht 5\' 6"  (1.676 m)  Wt 153 lb 7 oz (69.6 kg)  BMI 24.77 kg/m2  SpO2 100% Patient is alert and in no acute distress. Conjunctiva is pale sclera is nonicteric. No neck masses or thyromegaly noted. Cardiac exam with regular rhythm normal S1 and S2 and faint systolic ejection murmur best heard at aortic area. Lungs are clear to auscultation. Abdomen is soft and nontender without organomegaly or masses. No LE edema noted. Lab data reviewed. LFTs from this morning. Total bilirubin 2.2, AP 36, AST 10, ALT 10 and albumin is 2.5 . Assessment; Recurrent anemia. Patient has evidence of overt bleed and history of gastric and ileocecal ulcers secondary to NSAID therapy. It is  possible she is losing blood from upper GI tract but  anemia would appear to be multifactorial. History of autoimmune hepatitis. Transaminases are normal albumin is still quite low. Recommendations; Diagnostic EGD in a.m.

## 2011-09-19 NOTE — Progress Notes (Signed)
Report given to C. Raul Del, RN on Dept 300.  Pt is A&O, VSS.  Pt is to transfer to room 326 via wheelchair.  Pt and her belongings taken to room 326 by [employer], RN.  Pt's family is aware of transfer.

## 2011-09-19 NOTE — Care Management Note (Unsigned)
    Page 1 of 1   09/19/2011     4:28:40 PM   CARE MANAGEMENT NOTE 09/19/2011  Patient:  Eileen Santana, Eileen Santana   Account Number:  [id number]  Date Initiated:  09/19/2011  Documentation initiated by:  Rosemary Holms  Subjective/Objective Assessment:   Pt admitted from home where she lives with her two daughters. Dx Acute on Chronic anemia. PTA she has had [facility] but not active now. Pt does not believe she will have any HH needs after this admission.     Action/Plan:   CM will follow   Anticipated DC Date:  09/21/2011   Anticipated DC Plan:  HOME/SELF CARE      DC Planning Services  CM consult      Choice offered to / List presented to:             Status of service:  In process, will continue to follow Medicare Important Message given?   (If response is "NO", the following Medicare IM given date fields will be blank) Date Medicare IM given:   Date Additional Medicare IM given:    Discharge Disposition:    Per UR Regulation:    If discussed at Long Length of Stay Meetings, dates discussed:    Comments:  09/19/11 1015 Nyra Anspaugh Leanord Hawking RN BSN CM

## 2011-09-19 NOTE — Progress Notes (Signed)
Subjective: The patient complains of constipation. She has lower abdominal discomfort from constipation. Otherwise, no complaints of nausea, vomiting, back pain, or leg pain this morning.  Objective: Vital signs in last 24 hours: Filed Vitals:   09/19/11 0300 09/19/11 0400 09/19/11 0500 09/19/11 0600  BP: 119/74 111/65 127/58 121/78  Pulse: 74 74 78 87  Temp:      TempSrc:      Resp: 16 15 17 20   Height:      Weight:      SpO2: 99% 99% 99% 99%    Intake/Output Summary (Last 24 hours) at 09/19/11 0905 Last data filed at 09/19/11 0600  Gross per 24 hour  Intake 1879.17 ml  Output    750 ml  Net 1129.17 ml    Weight change:   Physical exam: General: Pleasant 78 year old African/American woman sitting up in bed, in no acute distress. Lungs: Clear to auscultation bilaterally. Heart: S1, S2, with a soft systolic murmur. Abdomen: Positive bowel sounds, obese, nontender, nondistended. Extremities: Trace of pedal edema bilaterally. Back: No tenderness to palpation. No spasms.  Lab Results: Basic Metabolic Panel:  Basename 09/19/11 0447 09/18/11 1202  NA 138 140  K 3.9 4.1  CL 106 104  CO2 25 25  GLUCOSE 83 125*  BUN 14 19  CREATININE 0.92 1.20*  CALCIUM 9.1 10.0  MG -- --  PHOS -- --   Liver Function Tests:  [facility] 09/19/11 0447  AST 36  ALT 10  ALKPHOS 48  BILITOT 2.2*  PROT 5.1*  ALBUMIN 2.5*   No results found for this basename: LIPASE:2,AMYLASE:2 in the last 72 hours No results found for this basename: AMMONIA:2 in the last 72 hours CBC:  Basename 09/19/11 0447 09/18/11 1202  WBC 6.8 7.1  NEUTROABS -- 6.3  HGB 7.8* 5.8*  HCT 23.6* 19.0*  MCV 74.0* 72.0*  PLT 159 178   Cardiac Enzymes: No results found for this basename: CKTOTAL:3,CKMB:3,CKMBINDEX:3,TROPONINI:3 in the last 72 hours BNP: No results found for this basename: PROBNP:3 in the last 72 hours D-Dimer: No results found for this basename: DDIMER:2 in the last 72 hours CBG: No results  found for this basename: GLUCAP:6 in the last 72 hours Hemoglobin A1C: No results found for this basename: HGBA1C in the last 72 hours Fasting Lipid Panel: No results found for this basename: CHOL,HDL,LDLCALC,TRIG,CHOLHDL,LDLDIRECT in the last 72 hours Thyroid Function Tests: No results found for this basename: TSH,T4TOTAL,FREET4,T3FREE,THYROIDAB in the last 72 hours Anemia Panel: No results found for this basename: VITAMINB12,FOLATE,FERRITIN,TIBC,IRON,RETICCTPCT in the last 72 hours Coagulation:  Basename 09/19/11 0447  LABPROT 16.1*  INR 1.26   Urine Drug Screen: Drugs of Abuse  No results found for this basename: labopia, cocainscrnur, labbenz, amphetmu, thcu, labbarb    Alcohol Level: No results found for this basename: ETH:2 in the last 72 hours Urinalysis: No results found for this basename: COLORURINE:2,APPERANCEUR:2,LABSPEC:2,PHURINE:2,GLUCOSEU:2,HGBUR:2,BILIRUBINUR:2,KETONESUR:2,PROTEINUR:2,UROBILINOGEN:2,NITRITE:2,LEUKOCYTESUR:2 in the last 72 hours Misc. Labs:   Micro: Recent Results (from the past 240 hour(s))  MRSA PCR SCREENING     Status: Normal   Collection Time   09/18/11  3:43 PM      Component Value Range Status Comment   MRSA by PCR NEGATIVE  NEGATIVE Final     Studies/Results: Dg Lumbar Spine Complete  09/18/2011  *RADIOLOGY REPORT*  Clinical Data: Back pain  LUMBAR SPINE - COMPLETE 4+ VIEW  Comparison: 08/10/2011  Findings: Severe narrowing at L4-5 and L5-S1 with vacuum.  5 mm anterolisthesis L4 on L5.  No vertebral compression deformity.  No pars defect.  Epigastric calcifications unchanged compared the recent CT.  IMPRESSION: No acute bony pathology.  Chronic changes.   Original Report Authenticated By: Donavan Burnet, M.D.     Medications:  Scheduled:   . acetaminophen  650 mg Oral Once  . azaTHIOprine  75 mg Oral Daily  . diphenhydrAMINE  25 mg Oral Once  . docusate sodium  100 mg Oral BID  . influenza  inactive virus vaccine  0.5 mL  Intramuscular Tomorrow-1000  . magnesium oxide  400 mg Oral Daily  . metoprolol tartrate  12.5 mg Oral BID  . pantoprazole (PROTONIX) IV  40 mg Intravenous Q12H  . predniSONE  10 mg Oral Daily  . sodium chloride      . sodium chloride      . Travoprost (BAK Free)  1 drop Both Eyes QHS  . ursodiol  500 mg Oral BID  . DISCONTD: pantoprazole (PROTONIX) IV  40 mg Intravenous Q24H  . DISCONTD: predniSONE  5 mg Oral Daily   Continuous:   . 0.9 % NaCl with KCl 20 mEq / L 75 mL/hr at 09/19/11 0600   ZOX:[id number], acetaminophen, alum & mag hydroxide-simeth, diphenhydrAMINE, guaiFENesin-dextromethorphan, HYDROcodone-acetaminophen, ondansetron (ZOFRAN) IV, ondansetron  Assessment: Principal Problem:  *Microcytic anemia Active Problems:  Autoimmune hepatitis  Chronic back pain  Leg pain  Rheumatoid arthritis  Acute renal insufficiency  Hypoalbuminemia  Guaiac positive stools    1. Acute on chronic transfusion-dependent anemia in the setting of guaiac positive stool. She is status post 2 units of packed red blood cell transfusions. Her hemoglobin improved appropriately from 5.8-7.8. Her haptoglobin is normal. Her LDH is modestly elevated. She continues on IV proton every 12 hours empirically. She has a history of ileocecal ulcer and peptic ulcer disease from endoscopic studies in 2012. Gastroenterologist, Dr. Karilyn Cota has been consulted. Of note, she takes a baby aspirin and prednisone chronically, but no other NSAIDs. Aspirin is currently being held.   Chronic low back pain/leg pain. Less symptomatic following the transfusions.  Acute renal failure secondary to prerenal azotemia. Resolved with IV fluids. Lasix is on hold.  Autoimmune hepatitis/rheumatoid arthritis. She is on Imuran and prednisone chronically. These are being continued.    Plan:  1. Continue IV fluids, IV Protonix, and supportive treatment. Continue clear liquid diet until she is evaluated by gastroenterology. 2.  Dr. Patty Sermons consultation is pending. 3. We'll continue to monitor her CBC daily. We'll transfuse for a hemoglobin of less than 7.5. 4. Will transfer to telemetry.   LOS: 1 day   Eileen Santana 09/19/2011, 9:05 AM

## 2011-09-19 NOTE — Progress Notes (Signed)
Second unit of blood tranfused, vital signs remained within normal limits and no petechia noted on lower extremities.

## 2011-09-20 ENCOUNTER — Encounter (HOSPITAL_COMMUNITY)
Admission: EM | Disposition: A | Discharge status: Home / Self Care | Payer: Self-pay | Source: Home / Self Care | Attending: Internal Medicine

## 2011-09-20 ENCOUNTER — Encounter (HOSPITAL_COMMUNITY): Payer: Self-pay

## 2011-09-20 DIAGNOSIS — K31819 Angiodysplasia of stomach and duodenum without bleeding: Secondary | ICD-10-CM

## 2011-09-20 DIAGNOSIS — K922 Gastrointestinal hemorrhage, unspecified: Secondary | ICD-10-CM

## 2011-09-20 DIAGNOSIS — B3781 Candidal esophagitis: Secondary | ICD-10-CM

## 2011-09-20 HISTORY — DX: Gastrointestinal hemorrhage, unspecified: K92.2

## 2011-09-20 HISTORY — PX: ESOPHAGOGASTRODUODENOSCOPY: SHX1529

## 2011-09-20 HISTORY — DX: Candidal esophagitis: B37.81

## 2011-09-20 HISTORY — DX: Angiodysplasia of stomach and duodenum without bleeding: K31.819

## 2011-09-20 HISTORY — PX: ESOPHAGOGASTRODUODENOSCOPY: SHX5428

## 2011-09-20 LAB — CBC
MCH: 24 pg — ABNORMAL LOW (ref 26.0–34.0)
MCV: 74.5 fL — ABNORMAL LOW (ref 78.0–100.0)
Platelets: 143 10*3/uL — ABNORMAL LOW (ref 150–400)
RDW: 20.4 % — ABNORMAL HIGH (ref 11.5–15.5)

## 2011-09-20 LAB — BASIC METABOLIC PANEL
BUN: 10 mg/dL (ref 6–23)
CO2: 24 mEq/L (ref 19–32)
Calcium: 8.8 mg/dL (ref 8.4–10.5)
Creatinine, Ser: 0.86 mg/dL (ref 0.50–1.10)
Glucose, Bld: 80 mg/dL (ref 70–99)

## 2011-09-20 LAB — KOH PREP

## 2011-09-20 SURGERY — EGD (ESOPHAGOGASTRODUODENOSCOPY)
Anesthesia: Moderate Sedation

## 2011-09-20 MED ORDER — ACETAMINOPHEN 325 MG PO TABS
650.0000 mg | ORAL_TABLET | Freq: Once | ORAL | Status: AC
  Administered 2011-09-20: 650 mg via ORAL
  Filled 2011-09-20: qty 2

## 2011-09-20 MED ORDER — SODIUM CHLORIDE 0.45 % IV SOLN
INTRAVENOUS | Status: DC
  Administered 2011-09-20: 07:00:00 via INTRAVENOUS

## 2011-09-20 MED ORDER — FUROSEMIDE 10 MG/ML IJ SOLN
20.0000 mg | Freq: Once | INTRAMUSCULAR | Status: AC
  Administered 2011-09-20: 20 mg via INTRAVENOUS
  Filled 2011-09-20: qty 2

## 2011-09-20 MED ORDER — MEPERIDINE HCL 50 MG/ML IJ SOLN
INTRAMUSCULAR | Status: AC
  Filled 2011-09-20: qty 1

## 2011-09-20 MED ORDER — NYSTATIN 100000 UNIT/ML MT SUSP: 5.0000 mL | Freq: Four times a day (QID) | OROMUCOSAL | Status: DC

## 2011-09-20 MED ORDER — MIDAZOLAM HCL 5 MG/5ML IJ SOLN
INTRAMUSCULAR | Status: AC
  Filled 2011-09-20: qty 10

## 2011-09-20 MED ORDER — ASPIRIN EC 81 MG PO TBEC: 81.0000 mg | DELAYED_RELEASE_TABLET | Freq: Every day | ORAL | Status: DC

## 2011-09-20 MED ORDER — PANTOPRAZOLE SODIUM 40 MG PO TBEC
40.0000 mg | DELAYED_RELEASE_TABLET | Freq: Two times a day (BID) | ORAL | Status: DC
  Administered 2011-09-20: 40 mg via ORAL
  Filled 2011-09-20 (×2): qty 1

## 2011-09-20 MED ORDER — DIPHENHYDRAMINE HCL 25 MG PO CAPS
25.0000 mg | ORAL_CAPSULE | Freq: Once | ORAL | Status: AC
  Administered 2011-09-20: 25 mg via ORAL

## 2011-09-20 MED ORDER — MEPERIDINE HCL 25 MG/ML IJ SOLN
INTRAMUSCULAR | Status: DC | PRN
  Administered 2011-09-20: 25 mg via INTRAVENOUS

## 2011-09-20 MED ORDER — SODIUM CHLORIDE 0.9 % IV SOLN
750.0000 mg | Freq: Two times a day (BID) | INTRAVENOUS | Status: DC
  Filled 2011-09-20 (×2): qty 750

## 2011-09-20 MED ORDER — SODIUM CHLORIDE 0.9 % IV SOLN: INTRAVENOUS | Status: DC

## 2011-09-20 MED ORDER — DEXLANSOPRAZOLE 60 MG PO CPDR: 60.0000 mg | DELAYED_RELEASE_CAPSULE | Freq: Two times a day (BID) | ORAL | Status: DC

## 2011-09-20 MED ORDER — NYSTATIN 100000 UNIT/ML MT SUSP
5.0000 mL | Freq: Four times a day (QID) | OROMUCOSAL | Status: DC
  Administered 2011-09-20: 13:00:00 via ORAL
  Administered 2011-09-20 (×2): 500000 [IU] via ORAL
  Filled 2011-09-20 (×3): qty 5

## 2011-09-20 MED ORDER — MIDAZOLAM HCL 5 MG/5ML IJ SOLN
INTRAMUSCULAR | Status: DC | PRN
  Administered 2011-09-20 (×2): 1 mg via INTRAVENOUS
  Administered 2011-09-20: 2 mg via INTRAVENOUS

## 2011-09-20 MED ORDER — BUTAMBEN-TETRACAINE-BENZOCAINE 2-2-14 % EX AERO
INHALATION_SPRAY | CUTANEOUS | Status: DC | PRN
  Administered 2011-09-20: 2 via TOPICAL

## 2011-09-20 MED ORDER — STERILE WATER FOR IRRIGATION IR SOLN
Status: DC | PRN
  Administered 2011-09-20: 07:00:00

## 2011-09-20 MED ORDER — SODIUM CHLORIDE 0.9 % IV SOLN
Freq: Once | INTRAVENOUS | Status: AC
  Administered 2011-09-20: 15:00:00 via INTRAVENOUS

## 2011-09-20 MED ORDER — TRAVOPROST (BAK FREE) 0.004 % OP SOLN: 1.0000 [drp] | Freq: Every day | OPHTHALMIC | Status: DC

## 2011-09-20 NOTE — Progress Notes (Signed)
ANTIBIOTIC CONSULT NOTE - INITIAL  Pharmacy Consult for Vancomycin Indication: pneumonia  Allergies  Allergen Reactions  . Iohexol Swelling    IV Dye   . Ivp Dye (Iodinated Diagnostic Agents) Swelling    Hives  . Naprosyn (Naproxen)     Drowsy, hallucinations  . Red Blood Cells Swelling and Dermatitis    With blood transfusion 2012  . Verapamil     Heart block (2nd degree)  . Vicodin (Hydrocodone-Acetaminophen) Other (See Comments)    hallucinations  . Sulfa Antibiotics Rash    Patient Measurements: Height: 5\' 6"  (167.6 cm) Weight: 159 lb 9.8 oz (72.4 kg) IBW/kg (Calculated) : 59.3    Vital Signs: Temp: 98.7 F (37.1 C) (09/17 1553) Temp src: Oral (09/17 1553) BP: 106/68 mmHg (09/17 1553) Pulse Rate: 81  (09/17 1553) Intake/Output from previous day: 09/16 0701 - 09/17 0700 In: 3135 [P.O.:1400; I.V.:1725; IV Piggyback:10] Out: 951 [Urine:950; Stool:1] Intake/Output from this shift: Total I/O In: 350 [Blood:350] Out: 175 [Urine:175]  Labs:  [facility] 09/20/11 0520 09/19/11 0447 09/18/11 1202  WBC 6.6 6.8 7.1  HGB 7.7* 7.8* 5.8*  PLT 143* 159 178  LABCREA -- -- --  CREATININE 0.86 0.92 1.20*   Estimated Creatinine Clearance: 54.9 ml/min (by C-G formula based on Cr of 0.86). No results found for this basename: VANCOTROUGH:2,VANCOPEAK:2,VANCORANDOM:2,GENTTROUGH:2,GENTPEAK:2,GENTRANDOM:2,TOBRATROUGH:2,TOBRAPEAK:2,TOBRARND:2,AMIKACINPEAK:2,AMIKACINTROU:2,AMIKACIN:2, in the last 72 hours   Microbiology: Recent Results (from the past 720 hour(s))  MRSA PCR SCREENING     Status: Normal   Collection Time   09/18/11  3:43 PM      Component Value Range Status Comment   MRSA by PCR NEGATIVE  NEGATIVE Final   KOH PREP     Status: Normal   Collection Time   09/20/11  7:58 AM      Component Value Range Status Comment   Specimen Description ESOPHAGUS BRUSHING   Final    Special Requests NONE   Final    KOH Prep     Final    Value: YEAST WITH PSEUDOHYPHAE   Performed at [facility]   Report Status 09/20/2011 FINAL   Final     Medical History: Past Medical History  Diagnosis Date  . H/O: GI bleed d/t NSAIDS  . Anemia     fe def anemia. Transfusion dependent.  . DJD (degenerative joint disease)   . Hypertension   . Colitis, acute hx of   . Ileitis hx of  . Pancytopenia 09/03/2010  . Sedimentation rate elevation 09/03/2010  . Autoimmune hepatitis     With leukocytoclastic vasculitis  . Heart block AV second degree March 2013  . Bronchitis   . Myopathy     Steroid-induced  . Renal calculus 08/12/2011  . Rheumatoid arthritis   . Colon ulcer 04/2010    NSAID related.  . Gastric ulcer 04/2010    NSAID related  . Cancer   . UGI bleed 09/20/2011    Focal area of gastritis oozing of blood.  . Gastric AVM 09/20/2011  . Candida esophagitis 09/20/2011    Medications:  Scheduled:    . sodium chloride   Intravenous Once  . acetaminophen  650 mg Oral Once  . azaTHIOprine  75 mg Oral Daily  . diphenhydrAMINE  25 mg Oral Once  . docusate sodium  100 mg Oral BID  . furosemide  20 mg Intravenous Once  . magnesium oxide  400 mg Oral Daily  . meperidine      . metoprolol tartrate  12.5 mg Oral BID  .  midazolam      . nystatin  5 mL Oral QID  . pantoprazole  40 mg Oral BID AC  . predniSONE  10 mg Oral Q breakfast  . Travoprost (BAK Free)  1 drop Both Eyes QHS  . ursodiol  500 mg Oral BID  . vancomycin  750 mg Intravenous Q12H  . DISCONTD: pantoprazole (PROTONIX) IV  40 mg Intravenous Q12H  . DISCONTD: Travoprost (BAK Free)  1 drop Both Eyes QHS   Assessment: CrCl 54.9 ml/min Pt weight 72.4 kg  Goal of Therapy:  Vancomycin trough level 15-20 mcg/ml  Plan:  Vancomycin 750 mg IV every 12 hours Vancomycin trough at steady state Monitor renal function Labs per protocol  Eileen Santana, Eileen Santana 09/20/2011,4:20 PM

## 2011-09-20 NOTE — Discharge Summary (Addendum)
Physician Discharge Summary  Eileen Santana HYQ:[id number] DOB: 1930-11-28 DOA: 09/18/2011  PCP: [name], MD  Admit date: 09/18/2011 Discharge date: 09/20/2011  Recommendations for Outpatient Follow-up:  1. The patient was discharged to home in improved condition. She will followup with Dr. Mariel Sleet as scheduled. She was instructed to call to make an appointment to followup with Dr. Karilyn Cota in 2 weeks.  Discharge Diagnoses:  Principal Problem:  *Microcytic anemia Active Problems:  Autoimmune hepatitis  HTN (hypertension), benign  Chronic back pain  Leg pain  Rheumatoid arthritis  Acute renal insufficiency  Hypoalbuminemia  Guaiac positive stools  UGI bleed  Gastric AVM  Candida esophagitis   1. Upper GI bleed. EGD per Dr. Karilyn Cota on 09/20/2011 revealed coffee ground material coating the mucosa at the gastric body and antrum/focal gastritis; focal area with oozing at the gastric body along the anterior wall; 2 mm AVM at the gastric body without stigmata of bleeding. Status post APC at the site of bleeding and APC at the AVM. 2. Cheesy exudate in the mid and distal esophagus, consistent with Candida esophagitis. 3. Chronic microcytic anemia, likely secondary to chronic blood loss. Status post a total of 2 units of packed red blood cell transfusions. Hemoglobin 7.7 prior to discharge. An additional unit of packed red blood cell transfusion is pending prior to discharge. 4. Acute renal insufficiency. Resolved with IV fluids. 5. Chronic low back pain and leg pain, asymptomatic at the time of discharge. 6. Rheumatoid arthritis. 7. Autoimmune hepatitis. 8. Hypertension. 9. Hypoalbuminemia. 10. Thrombocytopenia.   Discharge Condition: Improved.  Diet recommendation: Heart healthy.  Filed Weights   09/18/11 1005 09/18/11 1438 09/20/11 0422  Weight: 70.308 kg (155 lb) 69.6 kg (153 lb 7 oz) 72.4 kg (159 lb 9.8 oz)    History of present illness:  The patient is a 78 year old  woman with a history significant for transfusion dependent microcytic anemia, autoimmune hepatitis, ileocecal valve ulcer, and peptic ulcer disease, who presented to the emergency department on 09/18/2011 with a chief complaint of low back pain and leg pain. She said that her chronic pain intensifies when her blood count falls below 7. In the emergency department, she was noted to be hemodynamically stable although her blood pressure was on the lower side of normal. X-ray of her lumbar spine revealed no acute abnormalities. Her lab data were significant for hemoglobin of 5.8, MCV of 72, albumin of 2.8, AST of 42, and creatinine of 1.20. Her stool was guaiac positive. She was admitted for further evaluation and management.  Hospital Course:  The patient was typed and crossed and then transfused 2 units of packed red blood cells. IV fluids were given for IV hydration. Intravenous Protonix was started at every 12 hours. Aspirin was withheld. Her hemoglobin and hematocrit were followed closely. Supportive treatment was given otherwise. Gastroenterologist, Dr. Karilyn Cota was consulted. Following his evaluation, he proceeded with an upper endoscopy. The results were dictated above. Following the results, he changed the patient's proton pump inhibitor to oral routing. Mycostatin suspension was started and will continue at 4 times a day for 10 days for Candida esophagitis. He wanted to avoid fluconazole because of the patient's autoimmune hepatitis. He recommended holding aspirin for one week. He also recommended discontinuing Fosamax and discussing an alternative treatment for osteoporosis with her primary care physician. He also suggested another packed red blood cell transfusion.  The  patient improved clinically and symptomatically. She remained hemodynamically stable and afebrile. Her creatinine normalized to 0.86 prior to discharge.  As stated above, her hemoglobin improved from 5.8-7.7. She is being transfused another  unit of packed red blood cells prior to hospital discharge. A followup hemoglobin was not ordered. However, she has an appointment with Dr. Mariel Sleet in a few days, and at that time, another CBC can be ordered.  Procedures:  EGD.  Consultations:  Lionel December, M.D.  Discharge Exam: Filed Vitals:   09/20/11 0755 09/20/11 0800 09/20/11 1022 09/20/11 1459  BP: 129/73 124/68 111/65 115/66  Pulse: 86 83 88 85  Temp:    98.8 F (37.1 C)  TempSrc:    Oral  Resp: 21 19  18   Height:      Weight:      SpO2: 93% 92%  98%    General: Pleasant 78 year old woman sitting up in bed eating, in no acute distress. Cardiovascular: S1, S2, with no murmurs rubs or gallops. Respiratory: clear to auscultation bilaterally. Abdomen: Mildly obese, positive bowel sounds, soft, nontender, nondistended. Extremities: Trace of pedal edema bilaterally.  Discharge Instructions  Discharge Orders    Future Appointments: Provider: Department: Dept Phone: Center:   09/23/2011 2:00 PM Randall An, MD [facility] [phone] None     Future Orders Please Complete By Expires   Diet - low sodium heart healthy      Increase activity slowly      Discharge instructions      Comments:   Stop aspirin for now. Restart in one week. Dexlansoprazole was increased to 1 tablet 2 times daily.       Medication List     As of 09/20/2011  3:07 PM    STOP taking these medications         alendronate 70 MG tablet   Commonly known as: FOSAMAX      TAKE these medications         acetaminophen 325 MG tablet   Commonly known as: TYLENOL   Take 650 mg by mouth every 6 (six) hours as needed. For pain      ARANESP (ALBUMIN FREE) 500 MCG/ML injection   Generic drug: darbepoetin alfa-polysorbate   Inject 500 mcg into the skin once.      aspirin EC 81 MG tablet   Take 1 tablet (81 mg total) by mouth daily. Do not take aspirin until September 28, 2011; then restart it at 81 mg daily.      azaTHIOprine 50 MG  tablet   Commonly known as: IMURAN   Take 1.5 tablets (75 mg total) by mouth daily.      Co Q 10 100 MG Caps   Take 200 mg by mouth daily.      dexlansoprazole 60 MG capsule   Commonly known as: DEXILANT   Take 1 capsule (60 mg total) by mouth 2 (two) times daily.      diclofenac sodium 1 % Gel   Commonly known as: VOLTAREN   Apply 1 application topically 4 (four) times daily.      docusate sodium 100 MG capsule   Commonly known as: COLACE   Take 1 capsule (100 mg total) by mouth 2 (two) times daily.      fish oil-omega-3 fatty acids 1000 MG capsule   Take 1 g by mouth daily.      furosemide 20 MG tablet   Commonly known as: LASIX   Take 20 mg by mouth daily as needed. For fluid retention      HYDROcodone-acetaminophen 5-500 MG per tablet   Commonly known as: VICODIN  Take 1 tablet by mouth as needed. For pain      magnesium oxide 400 (241.3 MG) MG tablet   Commonly known as: MAG-OX   Take 1 tablet (400 mg total) by mouth daily.      metoprolol tartrate 25 MG tablet   Commonly known as: LOPRESSOR   Take 12.5 mg by mouth 2 (two) times daily.      multivitamin with minerals tablet   Take 1 tablet by mouth daily.      nystatin 100000 UNIT/ML suspension   Commonly known as: MYCOSTATIN   Take 5 mLs (500,000 Units total) by mouth 4 (four) times daily. Take for a 10 more days.      predniSONE 10 MG tablet   Commonly known as: DELTASONE   Take 0.5 tablets (5 mg total) by mouth daily. Take 2 tablets by mouth every morning      Travoprost (BAK Free) 0.004 % Soln ophthalmic solution   Commonly known as: TRAVATAN   Place 1 drop into both eyes at bedtime.      ursodiol 250 MG tablet   Commonly known as: ACTIGALL   Take 500 mg by mouth 2 (two) times daily.      vitamin B-12 1000 MCG tablet   Commonly known as: CYANOCOBALAMIN   Take 1,000 mcg by mouth daily.           Follow-up Information    Follow up with Randall An, MD. (Followup as scheduled.)    Contact  information:   [address] [place] [address] [phone]       Follow up with [facility]. Schedule an appointment as soon as possible for a visit in 2 weeks.   Contact information:   [address] [place] [address] [phone]          The results of significant diagnostics from this hospitalization (including imaging, microbiology, ancillary and laboratory) are listed below for reference.    Significant Diagnostic Studies: Dg Lumbar Spine Complete  09/18/2011  *RADIOLOGY REPORT*  Clinical Data: Back pain  LUMBAR SPINE - COMPLETE 4+ VIEW  Comparison: 08/10/2011  Findings: Severe narrowing at L4-5 and L5-S1 with vacuum.  5 mm anterolisthesis L4 on L5.  No vertebral compression deformity.  No pars defect.  Epigastric calcifications unchanged compared the recent CT.  IMPRESSION: No acute bony pathology.  Chronic changes.   Original Report Authenticated By: Donavan Burnet, M.D.     Microbiology: Recent Results (from the past 240 hour(s))  MRSA PCR SCREENING     Status: Normal   Collection Time   09/18/11  3:43 PM      Component Value Range Status Comment   MRSA by PCR NEGATIVE  NEGATIVE Final   KOH PREP     Status: Normal   Collection Time   09/20/11  7:58 AM      Component Value Range Status Comment   Specimen Description ESOPHAGUS BRUSHING   Final    Special Requests NONE   Final    KOH Prep     Final    Value: YEAST WITH PSEUDOHYPHAE     Performed at [facility]   Report Status 09/20/2011 FINAL   Final      Labs: Basic Metabolic Panel:  Lab 09/20/11 7425 09/19/11 0447 09/18/11 1202  NA 138 138 140  K 4.0 3.9 4.1  CL 109 106 104  CO2 24 25 25   GLUCOSE 80 83 125*  BUN 10 14 19   CREATININE 0.86 0.92 1.20*  CALCIUM 8.8 9.1 10.0  MG -- 1.5 --  PHOS -- -- --   Liver Function Tests:  Lab 09/19/11 0447  AST 36  ALT 10  ALKPHOS 48  BILITOT 2.2*  PROT 5.1*  ALBUMIN 2.5*   No results found for  this basename: LIPASE:5,AMYLASE:5 in the last 168 hours No results found for this basename: AMMONIA:5 in the last 168 hours CBC:  Lab 09/20/11 0520 09/19/11 0447 09/18/11 1202  WBC 6.6 6.8 7.1  NEUTROABS -- -- 6.3  HGB 7.7* 7.8* 5.8*  HCT 23.9* 23.6* 19.0*  MCV 74.5* 74.0* 72.0*  PLT 143* 159 178   Cardiac Enzymes: No results found for this basename: CKTOTAL:5,CKMB:5,CKMBINDEX:5,TROPONINI:5 in the last 168 hours BNP: BNP (last 3 results)  Basename 03/05/11 1538  PROBNP 1299.0*   CBG: No results found for this basename: GLUCAP:5 in the last 168 hours  Time coordinating discharge: greater than 30 minutes  Signed:  Abdoulaye Drum  [facility] 09/20/2011, 3:07 PM

## 2011-09-20 NOTE — Progress Notes (Signed)
UR Chart Review Completed  

## 2011-09-20 NOTE — ED Provider Notes (Signed)
Medical screening examination/treatment/procedure(s) were conducted as a shared visit with non-physician practitioner(s) and myself.  I personally evaluated the patient during the encounter.  Anemia with heme positive stools. Admit to transfuse  Donnetta Hutching, MD 09/20/11 1500

## 2011-09-20 NOTE — Op Note (Signed)
EGD PROCEDURE REPORT  PATIENT:  Eileen Santana  MR#:  [id number] Birthdate:  [birth year], 78 y.o., female Endoscopist:  Dr. Malissa Hippo, MD  Procedure Date: 09/20/2011  Procedure:   EGD  Indications:  Patient is 78 years old African female with multiple medical problems who presents with melena and anemia. He has history of peptic ulcer disease diagnosed in April 2012 secondary to NSAID therapy. She is on aspirin but does not take any other NSAIDs. However she is on Fosamax.            Informed Consent:  The risks, benefits, alternatives & imponderables which include, but are not limited to, bleeding, infection, perforation, drug reaction and potential missed lesion have been reviewed.  The potential for biopsy, lesion removal, esophageal dilation, etc. have also been discussed.  Questions have been answered.  All parties agreeable.  Please see history & physical in medical record for more information.  Medications:  Demerol 25 mg IV Versed 4 mg IV Cetacaine spray topically for oropharyngeal anesthesia  Description of procedure:  The endoscope was introduced through the mouth and advanced to the second portion of the duodenum without difficulty or limitations. The mucosal surfaces were surveyed very carefully during advancement of the scope and upon withdrawal.  Findings:  Esophagus:  There was patchy cheesy exudate in the mid and distal esophagus suspicious for Candida esophagitis. GE junction was was unremarkable. GEJ:  39 cm Stomach:  Stomach was empty and distended very well with insufflation. There were specks and streaks of coffee-ground material coating mucosa at gastric body and antrum. There was focal area with losing at gastric body along the anterior wall. There is 2 mm AVM at gastric body without stigmata of bleeding. Pyloric channel was patent. Angularis fundus and cardia were examined by retroflexing the scope and were normal. Duodenum:  Normal bulbar and post bulbar  mucosa.  Therapeutic/Diagnostic Maneuvers Performed:  Using argon plasma coagulator bleeding site was coagulated with good hemostasis. Tiny AV malformation at gastric body was also ablated with APC. On the way out brushing was taken from the esophagus for KOH prep.  Complications:   none  Impression: Esophageal candidiasis. Focal gastritis and gastric body with oozing along with streaks in specks of coffee-ground material coating gastric mucosa. This area was coagulated with argon plasma coagulator. Tiny gastric AV malformation also ablated with APC.    Recommendations:  Change PPI to oral route. Mycostatin suspension 500,000 units swish and swallow 4 times a day for 10 days. Hold aspirin for one week. Consider alternate or before osteoporosis as mucosal injury may be secondary to Fosamax. Consider transfusion with another unit of PRBCs as hemoglobin is 7.7 g.  Jaylan Hinojosa U  09/20/2011  8:02 AM  CC: Dr. [name], MD & Dr. Bonnetta Barry ref. provider found

## 2011-09-21 DIAGNOSIS — Z8711 Personal history of peptic ulcer disease: Secondary | ICD-10-CM

## 2011-09-21 DIAGNOSIS — K921 Melena: Secondary | ICD-10-CM

## 2011-09-21 DIAGNOSIS — D509 Iron deficiency anemia, unspecified: Secondary | ICD-10-CM

## 2011-09-21 DIAGNOSIS — K31811 Angiodysplasia of stomach and duodenum with bleeding: Secondary | ICD-10-CM

## 2011-09-21 DIAGNOSIS — K2961 Other gastritis with bleeding: Secondary | ICD-10-CM

## 2011-09-21 DIAGNOSIS — B3781 Candidal esophagitis: Secondary | ICD-10-CM

## 2011-09-21 LAB — TYPE AND SCREEN
ABO/RH(D): A POS
Antibody Screen: NEGATIVE
Unit division: 0
Unit division: 0

## 2011-09-23 ENCOUNTER — Encounter (HOSPITAL_COMMUNITY): Payer: Self-pay | Admitting: Internal Medicine

## 2011-09-23 ENCOUNTER — Encounter (HOSPITAL_COMMUNITY): Payer: Medicare Other | Attending: Oncology | Admitting: Oncology

## 2011-09-23 VITALS — BP 144/56 | HR 101 | Temp 98.4°F | Resp 16 | Wt 156.0 lb

## 2011-09-23 DIAGNOSIS — R7402 Elevation of levels of lactic acid dehydrogenase (LDH): Secondary | ICD-10-CM | POA: Insufficient documentation

## 2011-09-23 DIAGNOSIS — M069 Rheumatoid arthritis, unspecified: Secondary | ICD-10-CM

## 2011-09-23 DIAGNOSIS — R7401 Elevation of levels of liver transaminase levels: Secondary | ICD-10-CM | POA: Insufficient documentation

## 2011-09-23 DIAGNOSIS — D649 Anemia, unspecified: Secondary | ICD-10-CM | POA: Insufficient documentation

## 2011-09-23 DIAGNOSIS — M359 Systemic involvement of connective tissue, unspecified: Secondary | ICD-10-CM

## 2011-09-23 DIAGNOSIS — K754 Autoimmune hepatitis: Secondary | ICD-10-CM

## 2011-09-23 DIAGNOSIS — K922 Gastrointestinal hemorrhage, unspecified: Secondary | ICD-10-CM | POA: Insufficient documentation

## 2011-09-23 DIAGNOSIS — R748 Abnormal levels of other serum enzymes: Secondary | ICD-10-CM

## 2011-09-23 DIAGNOSIS — D509 Iron deficiency anemia, unspecified: Secondary | ICD-10-CM | POA: Insufficient documentation

## 2011-09-23 DIAGNOSIS — M310 Hypersensitivity angiitis: Secondary | ICD-10-CM

## 2011-09-23 LAB — CBC WITH DIFFERENTIAL/PLATELET
Basophils Relative: 0 % (ref 0–1)
Eosinophils Absolute: 0.1 10*3/uL (ref 0.0–0.7)
Hemoglobin: 10.1 g/dL — ABNORMAL LOW (ref 12.0–15.0)
MCH: 25.5 pg — ABNORMAL LOW (ref 26.0–34.0)
MCHC: 33 g/dL (ref 30.0–36.0)
Monocytes Relative: 6 % (ref 3–12)
Neutro Abs: 5.3 10*3/uL (ref 1.7–7.7)
Neutrophils Relative %: 87 % — ABNORMAL HIGH (ref 43–77)
Platelets: 117 10*3/uL — ABNORMAL LOW (ref 150–400)
RBC: 3.96 MIL/uL (ref 3.87–5.11)

## 2011-09-23 LAB — COMPREHENSIVE METABOLIC PANEL
Alkaline Phosphatase: 60 U/L (ref 39–117)
BUN: 8 mg/dL (ref 6–23)
CO2: 26 mEq/L (ref 19–32)
Chloride: 93 mEq/L — ABNORMAL LOW (ref 96–112)
Creatinine, Ser: 0.98 mg/dL (ref 0.50–1.10)
GFR calc Af Amer: 62 mL/min — ABNORMAL LOW (ref >90)
GFR calc non Af Amer: 54 mL/min — ABNORMAL LOW (ref >90)
Glucose, Bld: 128 mg/dL — ABNORMAL HIGH (ref 70–99)
Potassium: 4.4 mEq/L (ref 3.5–5.1)
Total Bilirubin: 0.8 mg/dL (ref 0.3–1.2)

## 2011-09-23 LAB — RETICULOCYTES: Retic Ct Pct: 3.3 % — ABNORMAL HIGH (ref 0.4–3.1)

## 2011-09-23 LAB — LACTATE DEHYDROGENASE: LDH: 266 U/L — ABNORMAL HIGH (ref 94–250)

## 2011-09-23 MED ORDER — DARBEPOETIN ALFA-POLYSORBATE 300 MCG/0.6ML IJ SOLN
500.0000 ug | INTRAMUSCULAR | Status: DC
  Administered 2011-09-23: 500 ug via SUBCUTANEOUS
  Filled 2011-09-23: qty 1.2

## 2011-09-23 MED ORDER — DARBEPOETIN ALFA-POLYSORBATE 500 MCG/ML IJ SOLN
INTRAMUSCULAR | Status: AC
  Filled 2011-09-23: qty 1

## 2011-09-23 NOTE — Progress Notes (Signed)
Eileen Santana presents today for injection per MD orders. Aranesp 500 mcg administered SQ in right Abdomen. Administration without incident. Patient tolerated well.

## 2011-09-23 NOTE — Patient Instructions (Addendum)
[facility]  Discharge Instructions  RECOMMENDATIONS MADE BY THE CONSULTANT AND ANY TEST RESULTS WILL BE SENT TO YOUR REFERRING DOCTOR.   EXAM FINDINGS BY MD TODAY AND SIGNS AND SYMPTOMS TO REPORT TO CLINIC OR PRIMARY MD: will check labs today and will give you your aranesp today.  We will need to check your blood work every 3 weeks.   MEDICATIONS PRESCRIBED: none   INSTRUCTIONS GIVEN AND DISCUSSED: Other :  Report increased fatigue or shortness of breath.  SPECIAL INSTRUCTIONS/FOLLOW-UP: Lab work Needed every 3 week and possible injections and Return to Clinic for follow-up in 3 months.   I acknowledge that I have been informed and understand all the instructions given to me and received a copy. I do not have any more questions at this time, but understand that I may call the [facility]ty [facility] at [facility] at [phone] during business hours should I have any further questions or need assistance in obtaining follow-up care.    __________________________________________  _____________  __________ Signature of Patient or Authorized Representative            Date                   Time    __________________________________________ Nurse's Signature

## 2011-09-23 NOTE — Progress Notes (Signed)
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 She has anemia because of this as well. She occasionally has a very high sedimentation rate. She was recently admitted to the hospital with a GI bleed was found at 2 small ulcers and was transfused several units of blood.  We need to reinstitute her Aranesp, await her lab results today. She feels good today but still a little week. She is still on her Imuran. We will continue check her blood work every 3 weeks but will check his cmet every 6 weeks and I'll see her in 12 weeks.

## 2011-09-25 LAB — FERRITIN: Ferritin: 17 ng/mL (ref 10–291)

## 2011-09-26 ENCOUNTER — Ambulatory Visit (HOSPITAL_COMMUNITY): Payer: Medicare Other | Admitting: Oncology

## 2011-09-27 ENCOUNTER — Other Ambulatory Visit (HOSPITAL_COMMUNITY): Payer: Self-pay | Admitting: *Deleted

## 2011-10-04 ENCOUNTER — Encounter (INDEPENDENT_AMBULATORY_CARE_PROVIDER_SITE_OTHER): Payer: Self-pay | Admitting: Internal Medicine

## 2011-10-04 ENCOUNTER — Ambulatory Visit (INDEPENDENT_AMBULATORY_CARE_PROVIDER_SITE_OTHER): Payer: Medicare Other | Admitting: Internal Medicine

## 2011-10-04 VITALS — BP 120/70 | HR 78 | Temp 98.4°F | Resp 18 | Ht 66.0 in | Wt 153.3 lb

## 2011-10-04 DIAGNOSIS — K754 Autoimmune hepatitis: Secondary | ICD-10-CM

## 2011-10-04 DIAGNOSIS — D509 Iron deficiency anemia, unspecified: Secondary | ICD-10-CM

## 2011-10-04 DIAGNOSIS — K219 Gastro-esophageal reflux disease without esophagitis: Secondary | ICD-10-CM

## 2011-10-04 MED ORDER — DEXLANSOPRAZOLE 60 MG PO CPDR: 60.0000 mg | DELAYED_RELEASE_CAPSULE | Freq: Every day | ORAL | Status: DC

## 2011-10-04 NOTE — Patient Instructions (Signed)
Notify if he have tarry stools or rectal bleeding. Call if you have breakthrough symptoms or heartburn with single dose of Dexilant daily.

## 2011-10-04 NOTE — Progress Notes (Signed)
Presenting complaint;  Followup for autoimmune hepatitis anemia and GI bleed.  Subjective:  Patient is 78 year old African female who presents for scheduled visit. She was hospitalized over 2 weeks ago with anemia and melena. EGD revealed focal gastritis with oozing as well as single AV malformation. These areas were treated with APC. She received 3 units of PRBCs she was advised to discontinue aspirin. She has no complaints. She feels quite well. She denies post stroke symptoms or shortness of breath. She has very good appetite and trying her best not to gain weeks. Her heartburn is well controlled with therapy. She denies abdominal pain melena or rectal bleeding.  Current Medications: Current Outpatient Prescriptions  Medication Sig Dispense Refill  . acetaminophen (TYLENOL) 325 MG tablet Take 650 mg by mouth every 6 (six) hours as needed. For pain      . azaTHIOprine (IMURAN) 50 MG tablet Take 1.5 tablets (75 mg total) by mouth daily.  45 tablet  5  . Coenzyme Q10 (CO Q 10) 100 MG CAPS Take 200 mg by mouth daily.      . darbepoetin alfa-polysorbate (ARANESP, ALBUMIN FREE,) 500 MCG/ML injection Inject 500 mcg into the skin once.      Marland Kitchen dexlansoprazole (DEXILANT) 60 MG capsule Take 1 capsule (60 mg total) by mouth daily.  90 capsule  3  . diclofenac sodium (VOLTAREN) 1 % GEL Apply 1 application topically 4 (four) times daily.      Marland Kitchen docusate sodium (COLACE) 100 MG capsule Take 1 capsule (100 mg total) by mouth 2 (two) times daily.  60 capsule  2  . fish oil-omega-3 fatty acids 1000 MG capsule Take 1 g by mouth daily.      . furosemide (LASIX) 20 MG tablet Take 20 mg by mouth daily as needed. For fluid retention      . HYDROcodone-acetaminophen (VICODIN) 5-500 MG per tablet Take 1 tablet by mouth as needed. For pain      . magnesium oxide (MAG-OX) 400 (241.3 MG) MG tablet Take 1 tablet (400 mg total) by mouth daily.  30 tablet  0  . metoprolol tartrate (LOPRESSOR) 25 MG tablet Take 12.5 mg by  mouth 2 (two) times daily.      . Multiple Vitamins-Minerals (MULTIVITAMIN WITH MINERALS) tablet Take 1 tablet by mouth daily.        . predniSONE (DELTASONE) 10 MG tablet Take 10 mg by mouth daily.      . Travoprost, BAK Free, (TRAVATAN) 0.004 % SOLN ophthalmic solution Place 1 drop into both eyes at bedtime.      . ursodiol (ACTIGALL) 250 MG tablet Take 500 mg by mouth 2 (two) times daily.      . vitamin B-12 (CYANOCOBALAMIN) 1000 MCG tablet Take 1,000 mcg by mouth daily.      Marland Kitchen DISCONTD: dexlansoprazole (DEXILANT) 60 MG capsule Take 1 capsule (60 mg total) by mouth 2 (two) times daily.  90 capsule  3   No current facility-administered medications for this visit.   Facility-Administered Medications Ordered in Other Visits  Medication Dose Route Frequency Provider Last Rate Last Dose  . diphenhydrAMINE (BENADRYL) capsule 25 mg  25 mg Oral Q6H PRN Randall An, MD   25 mg at 05/13/11 1610     Objective: Blood pressure 120/70, pulse 78, temperature 98.4 F (36.9 C), temperature source Oral, resp. rate 18, height 5\' 6"  (1.676 m), weight 153 lb 4.8 oz (69.536 kg). Patient is alert and in no acute distress. She has slight round  facies. Conjunctiva is pink. Sclera is nonicteric Oropharyngeal mucosa is normal. No neck masses or thyromegaly noted. Cardiac exam with regular rhythm normal S1 and S2. She has faint systolic ejection murmur at LLSB. Lungs are clear to auscultation. Abdomen is soft and nontender without organomegaly or masses. No LE edema or clubbing noted.  Labs/studies Results: Lab data from 09/23/2011. H&H 10.1 and 30.6 platelet count 117K. Bilirubin oh 0.8, AP 60, AST 47, ALT 13 and albumin 3.0. Serum creatinine 0.98. Serum ferritin 17 ng/ml. Sedimentation rate 8. LDH mildly elevated at 161(09-604). Reticulocyte count 3.3.  Assessment:  #1. Anemia. Her anemia would appear to be multifactorial. She has had well documented GI bleed. She is now off aspirin. Her AST is  mildly elevated with normal ALT. She also has mildly elevated LDH. I wonder if she also has low-grade hemolysis. #2. Autoimmune hepatitis. She appears to be in biochemical remission. Mildly elevated AST appears to be from nonhepatic source. In addition to autoimmune hepatitis she also has RA leukocytoclastic vasculitis. Current therapy with azathioprine and prednisone to treat all of these conditions. Eventually prednisone dose could be reviewed but will discuss with Dr. Mariel Sleet #3. Chronic GERD. She is doing well with therapy. I do not believe she needs to be on double dose PPI.   Plan:  Decrease Dexilant to 60 mg by mouth every morning. Call if he have breakthrough heartburn. Since she is getting periodic blood work by Dr. Mariel Sleet will not order any tests for now and plan to see her back in 4 months.

## 2011-10-05 ENCOUNTER — Telehealth (INDEPENDENT_AMBULATORY_CARE_PROVIDER_SITE_OTHER): Payer: Self-pay | Admitting: *Deleted

## 2011-10-05 NOTE — Telephone Encounter (Signed)
Eileen Santana would like for Dr. Karilyn Cota to give her a call about her mothers visit yesterday, 10/04/11. The return phone number is [phone] or [phone] or [phone]. One question was her mother said Dr. Karilyn Cota was going to decrease the Actigall and there was no mention of this in the visit summary.

## 2011-10-06 NOTE — Telephone Encounter (Signed)
I have called Eileen Santana. I left a message on her voicemail at work. I told her that Dr.Rehman had decreased her Mom's Dexilant to 1 per day and a new prescription was given for that. No labs are order as she is getting her lab work at Dr.Niejstorm's office We plan to see her in 4 months. If she had any other questions to please call me.

## 2011-10-14 ENCOUNTER — Encounter (HOSPITAL_COMMUNITY): Payer: Medicare Other | Attending: Oncology

## 2011-10-14 ENCOUNTER — Encounter (HOSPITAL_BASED_OUTPATIENT_CLINIC_OR_DEPARTMENT_OTHER): Payer: Medicare Other

## 2011-10-14 DIAGNOSIS — D649 Anemia, unspecified: Secondary | ICD-10-CM

## 2011-10-14 DIAGNOSIS — R7401 Elevation of levels of liver transaminase levels: Secondary | ICD-10-CM | POA: Insufficient documentation

## 2011-10-14 DIAGNOSIS — R7402 Elevation of levels of lactic acid dehydrogenase (LDH): Secondary | ICD-10-CM | POA: Insufficient documentation

## 2011-10-14 DIAGNOSIS — R748 Abnormal levels of other serum enzymes: Secondary | ICD-10-CM

## 2011-10-14 LAB — CBC WITH DIFFERENTIAL/PLATELET
Basophils Absolute: 0.1 10*3/uL (ref 0.0–0.1)
Basophils Relative: 1 % (ref 0–1)
Eosinophils Absolute: 0.2 10*3/uL (ref 0.0–0.7)
HCT: 28.4 % — ABNORMAL LOW (ref 36.0–46.0)
Hemoglobin: 8.9 g/dL — ABNORMAL LOW (ref 12.0–15.0)
Lymphs Abs: 0.9 10*3/uL (ref 0.7–4.0)
MCH: 22.9 pg — ABNORMAL LOW (ref 26.0–34.0)
MCHC: 31.3 g/dL (ref 30.0–36.0)
Monocytes Absolute: 0.6 10*3/uL (ref 0.1–1.0)
Neutro Abs: 3.9 10*3/uL (ref 1.7–7.7)
RDW: 22.3 % — ABNORMAL HIGH (ref 11.5–15.5)

## 2011-10-14 LAB — SEDIMENTATION RATE: Sed Rate: 30 mm/hr — ABNORMAL HIGH (ref 0–22)

## 2011-10-14 MED ORDER — DARBEPOETIN ALFA-POLYSORBATE 500 MCG/ML IJ SOLN
500.0000 ug | INTRAMUSCULAR | Status: DC
  Administered 2011-10-14: 500 ug via SUBCUTANEOUS

## 2011-10-14 MED ORDER — DARBEPOETIN ALFA-POLYSORBATE 500 MCG/ML IJ SOLN
INTRAMUSCULAR | Status: AC
  Filled 2011-10-14: qty 1

## 2011-10-14 NOTE — Progress Notes (Signed)
Tolerated venipuncture and injection well. 

## 2011-10-24 ENCOUNTER — Other Ambulatory Visit (HOSPITAL_COMMUNITY): Payer: Medicare Other

## 2011-11-04 ENCOUNTER — Encounter (HOSPITAL_COMMUNITY): Payer: Medicare Other | Attending: Oncology

## 2011-11-04 ENCOUNTER — Encounter (HOSPITAL_BASED_OUTPATIENT_CLINIC_OR_DEPARTMENT_OTHER): Payer: Medicare Other

## 2011-11-04 DIAGNOSIS — N19 Unspecified kidney failure: Secondary | ICD-10-CM | POA: Insufficient documentation

## 2011-11-04 DIAGNOSIS — R7401 Elevation of levels of liver transaminase levels: Secondary | ICD-10-CM | POA: Insufficient documentation

## 2011-11-04 DIAGNOSIS — D649 Anemia, unspecified: Secondary | ICD-10-CM

## 2011-11-04 DIAGNOSIS — R7402 Elevation of levels of lactic acid dehydrogenase (LDH): Secondary | ICD-10-CM | POA: Insufficient documentation

## 2011-11-04 DIAGNOSIS — D509 Iron deficiency anemia, unspecified: Secondary | ICD-10-CM | POA: Insufficient documentation

## 2011-11-04 LAB — CBC
HCT: 27.3 % — ABNORMAL LOW (ref 36.0–46.0)
Hemoglobin: 8.4 g/dL — ABNORMAL LOW (ref 12.0–15.0)
MCV: 69.3 fL — ABNORMAL LOW (ref 78.0–100.0)
RBC: 3.94 MIL/uL (ref 3.87–5.11)
RDW: 21.1 % — ABNORMAL HIGH (ref 11.5–15.5)
WBC: 5 10*3/uL (ref 4.0–10.5)

## 2011-11-04 MED ORDER — DARBEPOETIN ALFA-POLYSORBATE 300 MCG/0.6ML IJ SOLN
500.0000 ug | INTRAMUSCULAR | Status: DC
  Administered 2011-11-04: 500 ug via SUBCUTANEOUS
  Filled 2011-11-04: qty 1.2

## 2011-11-04 MED ORDER — DARBEPOETIN ALFA-POLYSORBATE 500 MCG/ML IJ SOLN
INTRAMUSCULAR | Status: AC
  Filled 2011-11-04: qty 1

## 2011-11-04 NOTE — Progress Notes (Signed)
Tolerated injection well. 

## 2011-11-11 ENCOUNTER — Other Ambulatory Visit (INDEPENDENT_AMBULATORY_CARE_PROVIDER_SITE_OTHER): Payer: Self-pay | Admitting: Internal Medicine

## 2011-11-24 ENCOUNTER — Other Ambulatory Visit (HOSPITAL_COMMUNITY): Payer: Medicare Other

## 2011-11-25 ENCOUNTER — Encounter (HOSPITAL_COMMUNITY): Payer: Medicare Other

## 2011-11-25 ENCOUNTER — Encounter (HOSPITAL_BASED_OUTPATIENT_CLINIC_OR_DEPARTMENT_OTHER): Payer: Medicare Other

## 2011-11-25 VITALS — BP 115/61 | HR 91

## 2011-11-25 DIAGNOSIS — D638 Anemia in other chronic diseases classified elsewhere: Secondary | ICD-10-CM

## 2011-11-25 DIAGNOSIS — R748 Abnormal levels of other serum enzymes: Secondary | ICD-10-CM

## 2011-11-25 DIAGNOSIS — D649 Anemia, unspecified: Secondary | ICD-10-CM

## 2011-11-25 DIAGNOSIS — D509 Iron deficiency anemia, unspecified: Secondary | ICD-10-CM

## 2011-11-25 LAB — CBC WITH DIFFERENTIAL/PLATELET
Eosinophils Relative: 1 % (ref 0–5)
HCT: 26 % — ABNORMAL LOW (ref 36.0–46.0)
Hemoglobin: 7.9 g/dL — ABNORMAL LOW (ref 12.0–15.0)
Lymphocytes Relative: 9 % — ABNORMAL LOW (ref 12–46)
Lymphs Abs: 0.4 10*3/uL — ABNORMAL LOW (ref 0.7–4.0)
MCV: 66.7 fL — ABNORMAL LOW (ref 78.0–100.0)
Monocytes Absolute: 0.3 10*3/uL (ref 0.1–1.0)
RBC: 3.9 MIL/uL (ref 3.87–5.11)
WBC: 4.8 10*3/uL (ref 4.0–10.5)

## 2011-11-25 LAB — LACTATE DEHYDROGENASE: LDH: 216 U/L (ref 94–250)

## 2011-11-25 LAB — RETICULOCYTES: Retic Count, Absolute: 39 10*3/uL (ref 19.0–186.0)

## 2011-11-25 MED ORDER — DARBEPOETIN ALFA-POLYSORBATE 500 MCG/ML IJ SOLN
INTRAMUSCULAR | Status: AC
  Filled 2011-11-25: qty 1

## 2011-11-25 MED ORDER — DARBEPOETIN ALFA-POLYSORBATE 500 MCG/ML IJ SOLN
500.0000 ug | INTRAMUSCULAR | Status: DC
  Administered 2011-11-25: 500 ug via SUBCUTANEOUS

## 2011-11-25 NOTE — Progress Notes (Unsigned)
Eileen Santana presented for labwork. Labs per MD order drawn via Peripheral Line 23 gauge needle inserted in rt forearm.  Good blood return present. Procedure without incident.  Needle removed intact. Patient tolerated procedure well. Awaiting results of labs.

## 2011-11-28 ENCOUNTER — Encounter (HOSPITAL_BASED_OUTPATIENT_CLINIC_OR_DEPARTMENT_OTHER): Payer: Medicare Other

## 2011-11-28 ENCOUNTER — Other Ambulatory Visit (HOSPITAL_COMMUNITY): Payer: Self-pay | Admitting: *Deleted

## 2011-11-28 DIAGNOSIS — D649 Anemia, unspecified: Secondary | ICD-10-CM

## 2011-11-28 LAB — PREPARE RBC (CROSSMATCH)

## 2011-11-28 LAB — HEMOGLOBIN AND HEMATOCRIT, BLOOD
HCT: 24.9 % — ABNORMAL LOW (ref 36.0–46.0)
Hemoglobin: 7.6 g/dL — ABNORMAL LOW (ref 12.0–15.0)

## 2011-11-28 NOTE — Progress Notes (Signed)
Labs drawn today for H&H

## 2011-11-28 NOTE — Addendum Note (Signed)
Addended by: Edythe Lynn A on: 11/28/2011 11:36 AM   Modules accepted: Orders, SmartSet

## 2011-11-28 NOTE — Progress Notes (Signed)
Talked to Olegario Messier and arrangements made for transfusion on 11/29/2011

## 2011-11-29 ENCOUNTER — Encounter (HOSPITAL_BASED_OUTPATIENT_CLINIC_OR_DEPARTMENT_OTHER): Payer: Medicare Other

## 2011-11-29 VITALS — BP 114/58 | HR 82 | Temp 98.4°F | Resp 16

## 2011-11-29 DIAGNOSIS — D649 Anemia, unspecified: Secondary | ICD-10-CM

## 2011-11-29 MED ORDER — SODIUM CHLORIDE 0.9 % IJ SOLN
INTRAMUSCULAR | Status: AC
  Filled 2011-11-29: qty 10

## 2011-11-29 MED ORDER — ACETAMINOPHEN 325 MG PO TABS
650.0000 mg | ORAL_TABLET | Freq: Once | ORAL | Status: AC
  Administered 2011-11-29: 650 mg via ORAL

## 2011-11-29 MED ORDER — ACETAMINOPHEN 325 MG PO TABS
ORAL_TABLET | ORAL | Status: AC
  Filled 2011-11-29: qty 2

## 2011-11-29 MED ORDER — DIPHENHYDRAMINE HCL 25 MG PO CAPS
25.0000 mg | ORAL_CAPSULE | Freq: Once | ORAL | Status: AC
  Administered 2011-11-29: 25 mg via ORAL

## 2011-11-29 MED ORDER — SODIUM CHLORIDE 0.9 % IV SOLN
250.0000 mL | Freq: Once | INTRAVENOUS | Status: AC
  Administered 2011-11-29: 250 mL via INTRAVENOUS

## 2011-11-29 MED ORDER — DIPHENHYDRAMINE HCL 25 MG PO CAPS
ORAL_CAPSULE | ORAL | Status: AC
  Filled 2011-11-29: qty 1

## 2011-11-29 MED ORDER — SODIUM CHLORIDE 0.9 % IJ SOLN
10.0000 mL | INTRAMUSCULAR | Status: DC | PRN
  Filled 2011-11-29: qty 10

## 2011-11-29 NOTE — Progress Notes (Signed)
Tolerated transfusion very well.

## 2011-11-30 LAB — TYPE AND SCREEN
ABO/RH(D): A POS
Antibody Screen: NEGATIVE
Unit division: 0

## 2011-12-23 ENCOUNTER — Other Ambulatory Visit (HOSPITAL_COMMUNITY): Payer: Medicare Other

## 2011-12-26 ENCOUNTER — Encounter (HOSPITAL_COMMUNITY): Payer: Self-pay | Admitting: Oncology

## 2011-12-26 ENCOUNTER — Encounter (HOSPITAL_COMMUNITY): Payer: Medicare Other | Attending: Oncology | Admitting: Oncology

## 2011-12-26 VITALS — BP 119/71 | HR 78 | Temp 98.3°F | Resp 16 | Wt 158.6 lb

## 2011-12-26 DIAGNOSIS — K754 Autoimmune hepatitis: Secondary | ICD-10-CM

## 2011-12-26 DIAGNOSIS — M310 Hypersensitivity angiitis: Secondary | ICD-10-CM

## 2011-12-26 DIAGNOSIS — D61818 Other pancytopenia: Secondary | ICD-10-CM | POA: Insufficient documentation

## 2011-12-26 DIAGNOSIS — M359 Systemic involvement of connective tissue, unspecified: Secondary | ICD-10-CM

## 2011-12-26 LAB — COMPREHENSIVE METABOLIC PANEL
Alkaline Phosphatase: 57 U/L (ref 39–117)
BUN: 12 mg/dL (ref 6–23)
Chloride: 102 mEq/L (ref 96–112)
Creatinine, Ser: 0.92 mg/dL (ref 0.50–1.10)
GFR calc Af Amer: 67 mL/min — ABNORMAL LOW (ref >90)
Glucose, Bld: 126 mg/dL — ABNORMAL HIGH (ref 70–99)
Potassium: 4 mEq/L (ref 3.5–5.1)
Total Bilirubin: 0.6 mg/dL (ref 0.3–1.2)

## 2011-12-26 LAB — CBC WITH DIFFERENTIAL/PLATELET
Basophils Relative: 1 % (ref 0–1)
Eosinophils Absolute: 0.1 10*3/uL (ref 0.0–0.7)
HCT: 27.3 % — ABNORMAL LOW (ref 36.0–46.0)
Hemoglobin: 8.4 g/dL — ABNORMAL LOW (ref 12.0–15.0)
Lymphocytes Relative: 7 % — ABNORMAL LOW (ref 12–46)
Lymphs Abs: 0.4 10*3/uL — ABNORMAL LOW (ref 0.7–4.0)
MCHC: 30.8 g/dL (ref 30.0–36.0)
MCV: 68.9 fL — ABNORMAL LOW (ref 78.0–100.0)
Monocytes Relative: 5 % (ref 3–12)
Neutro Abs: 4.6 10*3/uL (ref 1.7–7.7)
RDW: 24.1 % — ABNORMAL HIGH (ref 11.5–15.5)

## 2011-12-26 LAB — SEDIMENTATION RATE: Sed Rate: 18 mm/hr (ref 0–22)

## 2011-12-26 NOTE — Progress Notes (Signed)
Eileen Santana presented for labwork. Labs per MD order drawn via Peripheral Line 25 gauge needle inserted in rt ac.  Good blood return present. Procedure without incident.  Needle removed intact. Patient tolerated procedure well.

## 2011-12-26 NOTE — Patient Instructions (Addendum)
..  [facility] Discharge Instructions  RECOMMENDATIONS MADE BY THE CONSULTANT AND ANY TEST RESULTS WILL BE SENT TO YOUR REFERRING PHYSICIAN.  EXAM FINDINGS BY THE PHYSICIAN TODAY AND SIGNS OR SYMPTOMS TO REPORT TO CLINIC OR PRIMARY PHYSICIAN:  Exam good today   ISPECIAL INSTRUCTIONS/FOLLOW-UP: Labs every 4-6 weeks See Dr. In 3 months  Thank you for choosing [facility] to provide your oncology and hematology care.  To afford each patient quality time with our providers, please arrive at least 15 minutes before your scheduled appointment time.  With your help, our goal is to use those 15 minutes to complete the necessary work-up to ensure our physicians have the information they need to help with your evaluation and healthcare recommendations.    Effective January 1st, 2014, we ask that you re-schedule your appointment with our physicians should you arrive 10 or more minutes late for your appointment.  We strive to give you quality time with our providers, and arriving late affects you and other patients whose appointments are after yours.    Again, thank you for choosing [facility].  Our hope is that these requests will decrease the amount of time that you wait before being seen by our physicians.       _____________________________________________________________  I acknowledge that I have been informed and understand all the instructions given to me and received a copy. I do not have anymore questions at this time but understand that I may call the Cancer Center at [facility] at [phone] during business hours should I have any further questions or need assistance in obtaining follow-up care.

## 2011-12-26 NOTE — Progress Notes (Signed)
Problem #1 autoimmune disorder consistent with possibly rheumatoid arthritis and a leukocytoclastic vasculitis and a complicating autoimmune hepatitis. She is on Imuran and prednisone and she is feeling better and better. She cannot tell any difference after 2 units of blood in November you know her hemoglobin was significantly low. We stopped the Aranesp due to lack of efficacy.  She is not using the Lasix unless as needed. Her blood has not been checked since 11/25/2011. We'll check it today along with her sed rate and liver enzymes.  She is definitely feeling better overall and has done so for at least the last 3 months. She no longer comes up in her wheelchair and in fact she is driving and doing chores around the house. We will check her blood counts every 4-6 weeks. We may need to check her sedimentation rate in 12 weeks.

## 2012-01-19 ENCOUNTER — Encounter (HOSPITAL_COMMUNITY): Payer: Medicare Other | Attending: Oncology

## 2012-01-19 DIAGNOSIS — D649 Anemia, unspecified: Secondary | ICD-10-CM | POA: Insufficient documentation

## 2012-01-19 LAB — CBC
Platelets: 211 10*3/uL (ref 150–400)
RBC: 4.1 MIL/uL (ref 3.87–5.11)
RDW: 22.7 % — ABNORMAL HIGH (ref 11.5–15.5)
WBC: 6.9 10*3/uL (ref 4.0–10.5)

## 2012-01-19 NOTE — Progress Notes (Signed)
Labs drawn today for cbc 

## 2012-02-06 ENCOUNTER — Ambulatory Visit (INDEPENDENT_AMBULATORY_CARE_PROVIDER_SITE_OTHER): Payer: Medicare Other | Admitting: Internal Medicine

## 2012-02-06 ENCOUNTER — Encounter (INDEPENDENT_AMBULATORY_CARE_PROVIDER_SITE_OTHER): Payer: Self-pay | Admitting: Internal Medicine

## 2012-02-06 VITALS — BP 118/70 | HR 82 | Temp 98.0°F | Resp 18 | Ht 66.0 in | Wt 155.9 lb

## 2012-02-06 DIAGNOSIS — K754 Autoimmune hepatitis: Secondary | ICD-10-CM

## 2012-02-06 DIAGNOSIS — K219 Gastro-esophageal reflux disease without esophagitis: Secondary | ICD-10-CM

## 2012-02-06 DIAGNOSIS — D649 Anemia, unspecified: Secondary | ICD-10-CM

## 2012-02-06 DIAGNOSIS — K922 Gastrointestinal hemorrhage, unspecified: Secondary | ICD-10-CM

## 2012-02-06 DIAGNOSIS — D509 Iron deficiency anemia, unspecified: Secondary | ICD-10-CM

## 2012-02-06 MED ORDER — URSODIOL 250 MG PO TABS: 500.0000 mg | ORAL_TABLET | Freq: Two times a day (BID) | ORAL | Status: AC

## 2012-02-06 MED ORDER — AZATHIOPRINE 50 MG PO TABS: 75.0000 mg | ORAL_TABLET | Freq: Every day | ORAL | Status: DC

## 2012-02-06 MED ORDER — PREDNISONE 10 MG PO TABS: 5.0000 mg | ORAL_TABLET | Freq: Every day | ORAL | Status: DC

## 2012-02-06 MED ORDER — DEXLANSOPRAZOLE 60 MG PO CPDR: 60.0000 mg | DELAYED_RELEASE_CAPSULE | Freq: Every day | ORAL | Status: DC

## 2012-02-06 NOTE — Progress Notes (Signed)
Presenting complaint;  Followup for autoimmune hepatitis, GERD and anemia.  Subjective:  Patient is 79 year old African female with multiple medical problems who is here for scheduled visit. Her only complaint is one of intermittent back pain. She has very good appetite. She denies nausea vomiting abdominal pain. Bowels move regularly. Her heartburn is well controlled with therapy. She denies melena or rectal bleeding.  Current Medications: Current Outpatient Prescriptions  Medication Sig Dispense Refill  . acetaminophen (TYLENOL) 325 MG tablet Take 650 mg by mouth every 6 (six) hours as needed. For pain      . azaTHIOprine (IMURAN) 50 MG tablet Take 1.5 tablets (75 mg total) by mouth daily.  45 tablet  5  . Coenzyme Q10 (CO Q 10) 100 MG CAPS Take 200 mg by mouth daily.      Marland Kitchen dexlansoprazole (DEXILANT) 60 MG capsule Take 1 capsule (60 mg total) by mouth daily.  90 capsule  3  . diclofenac sodium (VOLTAREN) 1 % GEL Apply 1 application topically as needed.       . docusate sodium (COLACE) 100 MG capsule Take 1 capsule (100 mg total) by mouth 2 (two) times daily.  60 capsule  2  . fish oil-omega-3 fatty acids 1000 MG capsule Take 1 g by mouth daily.      . folic acid (FOLVITE) 800 MCG tablet Take 400 mcg by mouth.      . furosemide (LASIX) 20 MG tablet Take 20 mg by mouth daily as needed. For fluid retention      . metoprolol tartrate (LOPRESSOR) 25 MG tablet Take 12.5 mg by mouth 2 (two) times daily.      . Multiple Vitamins-Minerals (MULTIVITAMIN WITH MINERALS) tablet Take 1 tablet by mouth daily.        . predniSONE (DELTASONE) 10 MG tablet Take 5 mg by mouth daily.       . Travoprost, BAK Free, (TRAVATAN) 0.004 % SOLN ophthalmic solution Place 1 drop into both eyes at bedtime.      . ursodiol (ACTIGALL) 250 MG tablet Take 500 mg by mouth 2 (two) times daily.      . vitamin B-12 (CYANOCOBALAMIN) 1000 MCG tablet Take 1,000 mcg by mouth daily.       No current facility-administered  medications for this visit.   Facility-Administered Medications Ordered in Other Visits  Medication Dose Route Frequency Provider Last Rate Last Dose  . darbepoetin alfa-polysorbate (ARANESP) injection 500 mcg  500 mcg Subcutaneous Q21 days Randall An, MD   500 mcg at 11/25/11 1527  . diphenhydrAMINE (BENADRYL) capsule 25 mg  25 mg Oral Q6H PRN Randall An, MD   25 mg at 05/13/11 8295      Objective: Blood pressure 118/70, pulse 82, temperature 98 F (36.7 C), temperature source Oral, resp. rate 18, height 5\' 6"  (1.676 m), weight 155 lb 14.4 oz (70.716 kg). Patient is alert and in no acute distress. She has slight round faccies. Conjunctiva is pink. Sclera is nonicteric Oropharyngeal mucosa is normal. No neck masses or thyromegaly noted. Cardiac exam with regular rhythm normal S1 and S2. No murmur or gallop noted. Lungs are clear to auscultation. Abdomen is soft and nontender without organomegaly or masses. No LE edema or clubbing noted.  Labs/studies Results: Blood work from 01-31-2012 WBC 6.9, H&H 8.7 and 28.0 platelet count 211K. LFTs on 12/26/2011  Bilirubin oh 0.6, AP 57, AST to 54, ALT 13 and albumin 2.8   Assessment:  #1. Autoimmune hepatitis. AST is mildly  elevated but ALT is normal. AST may be from nonhepatic source i.e. Muscle. Overall she appears to be doing very well. She is not having any side effects with azathioprine. She will remain on current dose of azathioprine along with prednisone 5 mg daily. #2. GERD. She is doing well with therapy. We'll consider dropping PPI dose following next visit. #3 anemia. H&H is up slightly. No evidence of GI bleed. Anemia felt to be multifactorial.  Plan:  New prescriptions given for azathioprine, prednisone and Dexlansoprazole. Patient will return for office visit in 4 months.

## 2012-02-06 NOTE — Patient Instructions (Addendum)
Notify if he have rectal bleeding or tarry stools. Can take up to 1 g of Tylenol per day if needed.

## 2012-03-16 ENCOUNTER — Encounter (HOSPITAL_COMMUNITY): Payer: Medicare Other | Attending: Oncology

## 2012-03-16 ENCOUNTER — Encounter (HOSPITAL_COMMUNITY): Payer: Medicare Other

## 2012-03-16 VITALS — BP 112/67 | HR 86 | Temp 99.0°F | Resp 16

## 2012-03-16 DIAGNOSIS — D649 Anemia, unspecified: Secondary | ICD-10-CM | POA: Insufficient documentation

## 2012-03-16 DIAGNOSIS — R7401 Elevation of levels of liver transaminase levels: Secondary | ICD-10-CM | POA: Insufficient documentation

## 2012-03-16 DIAGNOSIS — R748 Abnormal levels of other serum enzymes: Secondary | ICD-10-CM

## 2012-03-16 DIAGNOSIS — R7402 Elevation of levels of lactic acid dehydrogenase (LDH): Secondary | ICD-10-CM | POA: Insufficient documentation

## 2012-03-16 DIAGNOSIS — R700 Elevated erythrocyte sedimentation rate: Secondary | ICD-10-CM | POA: Insufficient documentation

## 2012-03-16 LAB — COMPREHENSIVE METABOLIC PANEL
ALT: 13 U/L (ref 0–35)
Alkaline Phosphatase: 61 U/L (ref 39–117)
BUN: 16 mg/dL (ref 6–23)
Chloride: 101 mEq/L (ref 96–112)
Creatinine, Ser: 1.08 mg/dL (ref 0.50–1.10)
GFR calc Af Amer: 55 mL/min — ABNORMAL LOW (ref >90)
GFR calc non Af Amer: 48 mL/min — ABNORMAL LOW (ref >90)
Total Bilirubin: 0.6 mg/dL (ref 0.3–1.2)

## 2012-03-16 LAB — CBC WITH DIFFERENTIAL/PLATELET
Basophils Absolute: 0.1 10*3/uL (ref 0.0–0.1)
Basophils Relative: 1 % (ref 0–1)
Eosinophils Absolute: 0.2 10*3/uL (ref 0.0–0.7)
Eosinophils Relative: 3 % (ref 0–5)
HCT: 22.6 % — ABNORMAL LOW (ref 36.0–46.0)
MCH: 18.5 pg — ABNORMAL LOW (ref 26.0–34.0)
MCHC: 29.6 g/dL — ABNORMAL LOW (ref 30.0–36.0)
Monocytes Absolute: 0.9 10*3/uL (ref 0.1–1.0)
Monocytes Relative: 13 % — ABNORMAL HIGH (ref 3–12)
Neutro Abs: 4.4 10*3/uL (ref 1.7–7.7)
RDW: 20.4 % — ABNORMAL HIGH (ref 11.5–15.5)

## 2012-03-16 MED ORDER — DIPHENHYDRAMINE HCL 25 MG PO CAPS
ORAL_CAPSULE | ORAL | Status: AC
  Filled 2012-03-16: qty 1

## 2012-03-16 MED ORDER — SODIUM CHLORIDE 0.9 % IJ SOLN
10.0000 mL | INTRAMUSCULAR | Status: AC | PRN
  Administered 2012-03-16: 10 mL
  Filled 2012-03-16: qty 10

## 2012-03-16 MED ORDER — ACETAMINOPHEN 325 MG PO TABS
ORAL_TABLET | ORAL | Status: AC
  Filled 2012-03-16: qty 1

## 2012-03-16 MED ORDER — SODIUM CHLORIDE 0.9 % IV SOLN
250.0000 mL | Freq: Once | INTRAVENOUS | Status: AC
  Administered 2012-03-16: 250 mL via INTRAVENOUS

## 2012-03-16 MED ORDER — ACETAMINOPHEN 325 MG PO TABS
650.0000 mg | ORAL_TABLET | Freq: Once | ORAL | Status: AC
  Administered 2012-03-16: 325 mg via ORAL

## 2012-03-16 MED ORDER — DIPHENHYDRAMINE HCL 25 MG PO CAPS
25.0000 mg | ORAL_CAPSULE | Freq: Once | ORAL | Status: AC
  Administered 2012-03-16: 25 mg via ORAL

## 2012-03-16 NOTE — Progress Notes (Signed)
Labs drawn today for cbc/diff,cmp,ldh,sed rate,retic

## 2012-03-16 NOTE — Progress Notes (Signed)
CRITICAL VALUE ALERT Critical value received:  Hgb 6.7 Date of notification:  03/16/2012  Time of notification: 1035 Critical value read back:  yes Nurse who received alert:  Payton Doughty, RN MD notified (1st page):  Dr. Mariel Sleet  03/16/2012 1030 Per Dr. Mariel Sleet, Ardeth Perfect [place]alloran needs to receive 2 units of PRBCs.  I contacted Arville Go and informed her of same - she is coming in soon for a T&C and to receive at least 1 unit PRBCs today.  Lehua Flores Aguinaldo verbalized understanding of plan of care.

## 2012-03-16 NOTE — Addendum Note (Signed)
Addended by: Corena Herter D on: 03/16/2012 10:47 AM   Modules accepted: Orders, SmartSet

## 2012-03-16 NOTE — Addendum Note (Signed)
Addended byLeida Lauth on: 03/16/2012 11:17 AM   Modules accepted: Orders

## 2012-03-16 NOTE — Progress Notes (Signed)
Tolerated transfusion well. 

## 2012-03-17 LAB — TYPE AND SCREEN: Unit division: 0

## 2012-03-18 NOTE — Progress Notes (Signed)
-  rescheduled due to weather-  

## 2012-03-19 ENCOUNTER — Ambulatory Visit (HOSPITAL_COMMUNITY): Payer: Medicare Other | Admitting: Oncology

## 2012-03-28 ENCOUNTER — Encounter: Payer: Self-pay | Admitting: *Deleted

## 2012-03-30 ENCOUNTER — Encounter (HOSPITAL_BASED_OUTPATIENT_CLINIC_OR_DEPARTMENT_OTHER): Payer: Medicare Other | Admitting: Oncology

## 2012-03-30 VITALS — BP 129/75 | HR 79 | Temp 98.2°F | Resp 16 | Wt 155.6 lb

## 2012-03-30 DIAGNOSIS — K922 Gastrointestinal hemorrhage, unspecified: Secondary | ICD-10-CM

## 2012-03-30 DIAGNOSIS — D509 Iron deficiency anemia, unspecified: Secondary | ICD-10-CM

## 2012-03-30 DIAGNOSIS — D649 Anemia, unspecified: Secondary | ICD-10-CM

## 2012-03-30 DIAGNOSIS — M359 Systemic involvement of connective tissue, unspecified: Secondary | ICD-10-CM

## 2012-03-30 DIAGNOSIS — M549 Dorsalgia, unspecified: Secondary | ICD-10-CM

## 2012-03-30 DIAGNOSIS — R700 Elevated erythrocyte sedimentation rate: Secondary | ICD-10-CM

## 2012-03-30 NOTE — Progress Notes (Signed)
Eileen Courier, MD [address] [place] [address] [place] [address]  Anemia - Plan: CBC, CBC, CBC with Differential  Sedimentation rate elevation - Plan: Comprehensive metabolic panel, Lactate dehydrogenase, Sedimentation rate  GI bleed  Microcytic anemia  CURRENT THERAPY: PRBC transfusion as indicated  INTERVAL HISTORY: Eileen Santana 79 y.o. female returns for  regular  visit for followup of autoimmune disorder consistent with possibly rheumatoid arthritis and a leukoclastic vasculitis and a complicating autoimmune hepatitis.   Eileen Santana is status post 2 units of packed red blood cells on 03/16/2012 for hemoglobin of 6.7 g/dL.  Home is only complaint is low back pain.  I personally reviewed and went over radiographic studies with the patient.  She had a lumbar spine x-ray performed on 09/18/2011 that revealed severe narrowing at L4-5 and L5-S1 with a 5 mm anterolisthesis L4 on L5. No acute bony pathology of the chronic changes are noted.  Prior to this a CT of abdomen and pelvis without contrast on 08/10/2011 showed lumbar degenerative disc disease most severe at L4-5 and L5-S1.  She reports that she cannot take Aleve due to her GI bleeding and anemia. She is also to avoid aspirin. She been avoiding Tylenol because she thought aspirin wasn't Tylenol. I educated the patient on acetaminophen and recommended she may take this medication but no more than 4000 mg in a 24-hour period. She reports that, really does not help her much.  she does admit that hydrocodone is beneficial for the discomfort but it does make her drowsy and sleepy during the day. She reports that this is unacceptable side effects as she misses some of her shows that she wishes to watch television. I've encouraged her to cut a hydrocodone in half and try that. She relays back to me that she has tried this in this to makes her sleepy.  I think due to her condition we're kind of stuck with hydrocodone especially if this is helpful for  her. I've encouraged her to continue taking this medication for severe pain she may try Tylenol for mild to moderate discomfort. Fortunately, the pain is not occur on a daily basis. It may occur once per month sometimes even less frequent than that.  I've offered her a referral to either a pain clinic or Dr. Channing Mutters for consultation and potential injections if indicated. She is declined this. She does not want to see anymore doctors.  Otherwise, she denies any complaints. She does request that we check her blood a little bit more frequently than we have been in fear of worsening anemia. As result, we'll perform laboratory work every 4 weeks. Depending on her transfusion dependency, we can space these out further in the future. She reports that when her hemoglobin was 6.7 in the middle of March 2014, she was asymptomatic. As a result, she like to have her labs checked a little more frequently. She was also encouraged to contact the clinic and between lab appointment if necessary if she feels as though she is more fatigued, weak, or having symptoms of anemia.  Hematologically, the patient denies any complaints and ROS questioning is negative.  Past Medical History  Diagnosis Date  . H/O: GI bleed d/t NSAIDS  . Anemia     fe def anemia. Transfusion dependent.  . DJD (degenerative joint disease)   . Hypertension   . Colitis, acute hx of   . Ileitis hx of  . Pancytopenia 09/03/2010  . Sedimentation rate elevation 09/03/2010  . Autoimmune hepatitis  With leukocytoclastic vasculitis  . Heart block AV second degree March 2013  . Bronchitis   . Myopathy     Steroid-induced  . Renal calculus 08/12/2011  . Rheumatoid arthritis   . Colon ulcer 04/2010    NSAID related.  . Gastric ulcer 04/2010    NSAID related  . Cancer   . UGI bleed 09/20/2011    Focal area of gastritis oozing of blood.  . Gastric AVM 09/20/2011  . Candida esophagitis 09/20/2011  . CHF (congestive heart failure)   . Paroxysmal atrial  fibrillation   . Liver problem   . Pericarditis     2D Echo EF 65%-70%  . Chronic kidney disease     probably stage II to III with a recent acute exacerbation  . Cholestatic hepatitis     has Pancytopenia; Sedimentation rate elevation; Elevated liver enzymes; GI bleed; Microcytic anemia; Orthostatic hypotension; Bradycardia; Renal failure; Autoimmune hepatitis; HTN (hypertension), benign; Abdominal pain, acute, generalized; Nausea; Diarrhea; Hypomagnesemia; Hypokalemia; Prolonged Q-T interval on ECG; UTI (urinary tract infection); Gallstones; Thrombocytopenia; Renal calculus; Chronic back pain; Leg pain; Rheumatoid arthritis; Acute renal insufficiency; Hypoalbuminemia; Guaiac positive stools; UGI bleed; Gastric AVM; Candida esophagitis; and Anemia on her problem list.     is allergic to iohexol; ivp dye; naprosyn; red blood cells; verapamil; vicodin; and sulfa antibiotics.  Eileen Santana does not currently have medications on file.  Past Surgical History  Procedure Laterality Date  . Colonoscopy  04/2010  . Upper gastrointestinal endoscopy  04/2010  . Esophagogastroduodenoscopy  09/20/2011    Status post APC.  Marland Kitchen Esophagogastroduodenoscopy  09/20/2011    Procedure: ESOPHAGOGASTRODUODENOSCOPY (EGD);  Surgeon: Malissa Hippo, MD;  Location: [facility];  Service: Endoscopy;  Laterality: N/A;  . Cyst removal hand      Elbow    Denies any headaches, dizziness, double vision, fevers, chills, night sweats, nausea, vomiting, diarrhea, constipation, chest pain, heart palpitations, shortness of breath, blood in stool, black tarry stool, urinary pain, urinary burning, urinary frequency, hematuria.   PHYSICAL EXAMINATION  ECOG PERFORMANCE STATUS: 1 - Symptomatic but completely ambulatory  Filed Vitals:   03/30/12 1300  BP: 129/75  Pulse: 79  Temp: 98.2 F (36.8 C)  Resp: 16    GENERAL:alert, no distress, well nourished, well developed, comfortable, cooperative and smiling SKIN: skin  color, texture, turgor are normal, no rashes or significant lesions HEAD: Normocephalic, No masses, lesions, tenderness or abnormalities EYES: normal, Conjunctiva are pink and non-injected EARS: External ears normal OROPHARYNX:mucous membranes are moist  NECK: supple, trachea midline LYMPH:  not examined BREAST:not examined LUNGS: clear to auscultation and percussion HEART: regular rate & rhythm, no murmurs, no gallops, S1 normal and S2 normal ABDOMEN:abdomen soft, non-tender and normal bowel sounds BACK: Back symmetric, no curvature. EXTREMITIES:less then 2 second capillary refill, no joint deformities, effusion, or inflammation, no edema, no skin discoloration, no clubbing, no cyanosis  NEURO: alert & oriented x 3 with fluent speech, no focal motor/sensory deficits, gait normal    LABORATORY DATA: CBC    Component Value Date/Time   WBC 7.1 03/16/2012 0937   RBC 3.62* 03/16/2012 0937   HGB 6.7* 03/16/2012 0937   HCT 22.6* 03/16/2012 0937   PLT 161 03/16/2012 0937   MCV 62.4* 03/16/2012 0937   MCH 18.5* 03/16/2012 0937   MCHC 29.6* 03/16/2012 0937   RDW 20.4* 03/16/2012 0937   LYMPHSABS 1.6 03/16/2012 0937   MONOABS 0.9 03/16/2012 0937   EOSABS 0.2 03/16/2012 0937   BASOSABS 0.1 03/16/2012  4098   Lab Results  Component Value Date   ESRSEDRATE 29* 03/16/2012      ASSESSMENT:  1. Autoimmune disorder consistent with possibly rheumatoid arthritis and a leukoclastic vasculitis and a complicating autoimmune hepatitis. 2. Anemia secondary to #1 3. Low back pain, secondary to degenerative disc disease and severe narrowing at L4-5 and L5-S1  PLAN:  1. I personally reviewed and went over laboratory results with the patient. 2. I personally reviewed and went over radiographic studies with the patient. 3. Tylenol as needed for pain not to exceed 4,000 mg in 24 hours 4. Hydrocodone for back pain. 5. Patient declined a referral to pain management or Dr. Channing Mutters. 6. Labs every 4 weeks: CBC 7.  Labs in 12 weeks: CBC diff, CMET, LDH, ESR 8. Return in 12 weeks for follow-up.    All questions were answered. The patient knows to call the clinic with any problems, questions or concerns. We can certainly see the patient much sooner if necessary.  The patient and plan discussed with Glenford Peers, MD and he is in agreement with the aforementioned.  Miroslav Gin

## 2012-03-30 NOTE — Patient Instructions (Signed)
[facility] Discharge Instructions  RECOMMENDATIONS MADE BY THE CONSULTANT AND ANY TEST RESULTS WILL BE SENT TO YOUR REFERRING PHYSICIAN.  MEDICATIONS PRESCRIBED:  None  INSTRUCTIONS GIVEN AND DISCUSSED: May take Tylenol up to 4,000 mg/24 hours. May take Hydrocodone as prescribed for back pain.  Recommend cutting in hald due to the side effect of drowsiness.   SPECIAL INSTRUCTIONS/FOLLOW-UP: Offered referral to specialist for back pain, [place] declined. Please call the clinic between appointments if needed for lab work. We will perform labs every 4 weeks or so.  Depending on transfusion requirements we can space this out more in the future.  Return for follow-up in 3 months.  Thank you for choosing [facility] to provide your oncology and hematology care.  To afford each patient quality time with our providers, please arrive at least 15 minutes before your scheduled appointment time.  With your help, our goal is to use those 15 minutes to complete the necessary work-up to ensure our physicians have the information they need to help with your evaluation and healthcare recommendations.    Effective January 1st, 2014, we ask that you re-schedule your appointment with our physicians should you arrive 10 or more minutes late for your appointment.  We strive to give you quality time with our providers, and arriving late affects you and other patients whose appointments are after yours.    Again, thank you for choosing [facility].  Our hope is that these requests will decrease the amount of time that you wait before being seen by our physicians.       _____________________________________________________________  Should you have questions after your visit to [facility], please contact our office at [phone] between the hours of 8:30 a.m. and 5:00 p.m.  Voicemails left after 4:30 p.m. will not be returned until the following  business day.  For prescription refill requests, have your pharmacy contact our office with your prescription refill request.

## 2012-04-23 ENCOUNTER — Encounter (HOSPITAL_COMMUNITY): Payer: Medicare Other | Attending: Oncology

## 2012-04-23 DIAGNOSIS — D649 Anemia, unspecified: Secondary | ICD-10-CM | POA: Insufficient documentation

## 2012-04-23 LAB — CBC
HCT: 25.7 % — ABNORMAL LOW (ref 36.0–46.0)
Hemoglobin: 7.9 g/dL — ABNORMAL LOW (ref 12.0–15.0)
MCH: 19.8 pg — ABNORMAL LOW (ref 26.0–34.0)
MCHC: 30.7 g/dL (ref 30.0–36.0)
MCV: 64.6 fL — ABNORMAL LOW (ref 78.0–100.0)
RBC: 3.98 MIL/uL (ref 3.87–5.11)

## 2012-04-23 NOTE — Progress Notes (Signed)
Labs drawn today for cbc 

## 2012-04-24 ENCOUNTER — Telehealth (HOSPITAL_COMMUNITY): Payer: Self-pay

## 2012-04-24 NOTE — Telephone Encounter (Signed)
Patient called to discuss labs.  States that she feels fine at this time except for her back pain.  Instructed her to return in 3 weeks for follow up labs and that we would call her with appointment time.

## 2012-05-12 ENCOUNTER — Encounter: Payer: Self-pay | Admitting: Cardiovascular Disease

## 2012-05-15 ENCOUNTER — Encounter (HOSPITAL_COMMUNITY): Payer: Medicare Other | Attending: Oncology

## 2012-05-15 DIAGNOSIS — D649 Anemia, unspecified: Secondary | ICD-10-CM

## 2012-05-15 DIAGNOSIS — D61818 Other pancytopenia: Secondary | ICD-10-CM

## 2012-05-15 DIAGNOSIS — D509 Iron deficiency anemia, unspecified: Secondary | ICD-10-CM | POA: Insufficient documentation

## 2012-05-15 LAB — CBC WITH DIFFERENTIAL/PLATELET
Basophils Absolute: 0.1 10*3/uL (ref 0.0–0.1)
Lymphocytes Relative: 16 % (ref 12–46)
Neutro Abs: 4.6 10*3/uL (ref 1.7–7.7)
Neutrophils Relative %: 71 % (ref 43–77)
Platelets: 170 10*3/uL (ref 150–400)
RDW: 22.3 % — ABNORMAL HIGH (ref 11.5–15.5)
WBC: 6.6 10*3/uL (ref 4.0–10.5)

## 2012-05-15 NOTE — Progress Notes (Signed)
Labs drawn today for cbc/diff 

## 2012-05-16 ENCOUNTER — Other Ambulatory Visit (HOSPITAL_COMMUNITY): Payer: Self-pay | Admitting: *Deleted

## 2012-05-16 DIAGNOSIS — D509 Iron deficiency anemia, unspecified: Secondary | ICD-10-CM

## 2012-05-17 ENCOUNTER — Other Ambulatory Visit (HOSPITAL_COMMUNITY): Payer: Self-pay | Admitting: Oncology

## 2012-05-17 ENCOUNTER — Telehealth (HOSPITAL_COMMUNITY): Payer: Self-pay | Admitting: *Deleted

## 2012-05-17 DIAGNOSIS — D509 Iron deficiency anemia, unspecified: Secondary | ICD-10-CM

## 2012-05-17 NOTE — Telephone Encounter (Signed)
2 units ordered with Tylenol and Benadryl pre-meds and Lasix after transfusion

## 2012-05-17 NOTE — Telephone Encounter (Signed)
Eileen Santana called and they are going out of town at the end of the month. Eileen Santana would like to be transfused before they go. I scheduled her to be rechecked and typed and crossed next Thursday/transfuse Friday. If that's ok can you put orders in.

## 2012-05-23 ENCOUNTER — Encounter: Payer: Self-pay | Admitting: Cardiovascular Disease

## 2012-05-23 ENCOUNTER — Ambulatory Visit (INDEPENDENT_AMBULATORY_CARE_PROVIDER_SITE_OTHER): Payer: Medicare Other | Admitting: Cardiovascular Disease

## 2012-05-23 VITALS — BP 110/60 | HR 77 | Resp 16 | Ht 66.0 in | Wt 150.0 lb

## 2012-05-23 DIAGNOSIS — I4891 Unspecified atrial fibrillation: Secondary | ICD-10-CM

## 2012-05-23 DIAGNOSIS — I10 Essential (primary) hypertension: Secondary | ICD-10-CM

## 2012-05-23 DIAGNOSIS — I471 Supraventricular tachycardia: Secondary | ICD-10-CM

## 2012-05-23 DIAGNOSIS — I422 Other hypertrophic cardiomyopathy: Secondary | ICD-10-CM | POA: Insufficient documentation

## 2012-05-23 NOTE — Progress Notes (Signed)
Patient ID: Eileen Santana, female   DOB: 04-04-1930, 79 y.o.   MRN: [id number]  HPI  Generally feels well except for low back pain. No palpitations, dizziness or dyspnea or syncope. Unaware of palpitations while I examine her and there is very frequent ectopy and short bursts of tachycardia.  Allergies  Allergen Reactions  . Iohexol Swelling    IV Dye   . Ivp Dye (Iodinated Diagnostic Agents) Swelling    Hives  . Naprosyn (Naproxen)     Drowsy, hallucinations  . Red Blood Cells Swelling and Dermatitis    With blood transfusion 2012  . Verapamil     Heart block (2nd degree)  . Vicodin (Hydrocodone-Acetaminophen) Other (See Comments)    hallucinations  . Sulfa Antibiotics Rash    Current Outpatient Prescriptions  Medication Sig Dispense Refill  . acetaminophen (TYLENOL) 325 MG tablet Take 650 mg by mouth every 6 (six) hours as needed. For pain      . ALPRAZolam (XANAX) 0.25 MG tablet Take 0.25 mg by mouth at bedtime as needed for sleep.      Marland Kitchen azaTHIOprine (IMURAN) 50 MG tablet Take 1.5 tablets (75 mg total) by mouth daily.  45 tablet  5  . Coenzyme Q10 (CO Q 10) 100 MG CAPS Take 200 mg by mouth daily.      Marland Kitchen dexlansoprazole (DEXILANT) 60 MG capsule Take 1 capsule (60 mg total) by mouth daily.  90 capsule  3  . diclofenac sodium (VOLTAREN) 1 % GEL Apply 1 application topically as needed.       . fish oil-omega-3 fatty acids 1000 MG capsule Take 1 g by mouth daily.      . folic acid (FOLVITE) 800 MCG tablet Take 400 mcg by mouth.      . furosemide (LASIX) 20 MG tablet Take 20 mg by mouth daily as needed. For fluid retention      . metoprolol tartrate (LOPRESSOR) 25 MG tablet Take 12.5 mg by mouth 2 (two) times daily.      . Multiple Vitamins-Minerals (MULTIVITAMIN WITH MINERALS) tablet Take 1 tablet by mouth daily.        . predniSONE (DELTASONE) 10 MG tablet Take 10 mg by mouth daily.      Marland Kitchen spironolactone (ALDACTONE) 50 MG tablet Take 50 mg by mouth daily.       . Travoprost,  BAK Free, (TRAVATAN) 0.004 % SOLN ophthalmic solution Place 1 drop into both eyes at bedtime.      . ursodiol (ACTIGALL) 250 MG tablet Take 2 tablets (500 mg total) by mouth 2 (two) times daily.  120 tablet  11  . vitamin B-12 (CYANOCOBALAMIN) 1000 MCG tablet Take 1,000 mcg by mouth daily.      Marland Kitchen alendronate (FOSAMAX) 70 MG tablet Take 70 mg by mouth every 7 (seven) days. Take with a full glass of water on an empty stomach.      Marland Kitchen aspirin 81 MG tablet Take 81 mg by mouth daily.       No current facility-administered medications for this visit.   Facility-Administered Medications Ordered in Other Visits  Medication Dose Route Frequency Provider Last Rate Last Dose  . darbepoetin alfa-polysorbate (ARANESP) injection 500 mcg  500 mcg Subcutaneous Q21 days Randall An, MD   500 mcg at 11/25/11 1527  . diphenhydrAMINE (BENADRYL) capsule 25 mg  25 mg Oral Q6H PRN Randall An, MD   25 mg at 05/13/11 0865    Past Medical History  Diagnosis Date  .  H/O: GI bleed d/t NSAIDS  . Anemia     fe def anemia. Transfusion dependent.  . DJD (degenerative joint disease)   . Hypertension   . Colitis, acute hx of   . Ileitis hx of  . Pancytopenia 09/03/2010  . Sedimentation rate elevation 09/03/2010  . Autoimmune hepatitis     With leukocytoclastic vasculitis  . Heart block AV second degree March 2013  . Bronchitis   . Myopathy     Steroid-induced  . Renal calculus 08/12/2011  . Rheumatoid arthritis   . Colon ulcer 04/2010    NSAID related.  . Gastric ulcer 04/2010    NSAID related  . Cancer   . UGI bleed 09/20/2011    Focal area of gastritis oozing of blood.  . Gastric AVM 09/20/2011  . Candida esophagitis 09/20/2011  . CHF (congestive heart failure)   . Paroxysmal atrial fibrillation   . Liver problem   . Pericarditis     2D Echo EF 65%-70%  . Chronic kidney disease     probably stage II to III with a recent acute exacerbation  . Cholestatic hepatitis     Past Surgical History   Procedure Laterality Date  . Colonoscopy  04/2010  . Upper gastrointestinal endoscopy  04/2010  . Esophagogastroduodenoscopy  09/20/2011    Status post APC.  Marland Kitchen Esophagogastroduodenoscopy  09/20/2011    Procedure: ESOPHAGOGASTRODUODENOSCOPY (EGD);  Surgeon: Malissa Hippo, MD;  Location: [facility];  Service: Endoscopy;  Laterality: N/A;  . Cyst removal hand      Elbow    Family History  Problem Relation Age of Onset  . Stroke Mother   . Hypertension Sister   . Hypertension Brother   . Heart disease Mother 36  . Heart disease Father 95  . COPD Brother   . Arthritis Brother   . Osteoporosis Sister     History   Social History  . Marital Status: Single    Spouse Name: N/A    Number of Children: N/A  . Years of Education: N/A   Occupational History  . Not on file.   Social History Main Topics  . Smoking status: Current Some Day Smoker    Types: Cigarettes  . Smokeless tobacco: Never Used  . Alcohol Use: No  . Drug Use: No  . Sexually Active: Not Currently   Other Topics Concern  . Not on file   Social History Narrative  . No narrative on file    ROS:The patient specifically denies any chest pain at rest or with exertion, dyspnea at rest or with exertion, orthopnea, paroxysmal nocturnal dyspnea, syncope, palpitations, focal neurological deficits, intermittent claudication, lower extremity edema, unexplained weight gain, cough, hemoptysis or wheezing.   PHYSICAL EXAM BP 110/60  Pulse 77  Resp 16  Ht 5\' 6"  (1.676 m)  Wt 150 lb (68.04 kg)  BMI 24.22 kg/m2  General: Alert, oriented x3, no distress Head: no evidence of trauma, PERRL, EOMI, no exophtalmos or lid lag, no myxedema, no xanthelasma; normal ears, nose and oropharynx Neck: normal jugular venous pulsations and no hepatojugular reflux; brisk carotid pulses without delay and no carotid bruits Chest: clear to auscultation, no signs of consolidation by percussion or palpation, normal fremitus, symmetrical  and full respiratory excursions Cardiovascular: normal position and quality of the apical impulse, regular rhythm, normal first and second heart sounds,  rubs or gallops. There is a short, early systolic murmur with a raspy quality at the left lower sternal border- I had identified  this as a pericardial rub at our previous encounter, but suspect now that it is a murmur Abdomen: no tenderness or distention, no masses by palpation, no abnormal pulsatility or arterial bruits, normal bowel sounds, no hepatosplenomegaly Extremities: no clubbing, cyanosis or edema; 2+ radial, ulnar and brachial pulses bilaterally; 2+ right femoral, posterior tibial and dorsalis pedis pulses; 2+ left femoral, posterior tibial and dorsalis pedis pulses; no subclavian or femoral bruits Neurological: grossly nonfocal   EKG: Ectopic atrial rhythm, transitioning gradually to sinus rhythm, nonspecific T wave changes.V5-V6 Suspect repol changes are due to LVH, but does not meet voltage criteria.  Lipid Panel     Component Value Date/Time   CHOL 196 03/06/2011 0410   TRIG 41 03/06/2011 0410   HDL 68 03/06/2011 0410   CHOLHDL 2.9 03/06/2011 0410   VLDL 8 03/06/2011 0410   LDLCALC 120* 03/06/2011 0410    BMET    Component Value Date/Time   NA 139 03/16/2012 0937   K 3.7 03/16/2012 0937   CL 101 03/16/2012 0937   CO2 25 03/16/2012 0937   GLUCOSE 146* 03/16/2012 0937   BUN 16 03/16/2012 0937   CREATININE 1.08 03/16/2012 0937   CREATININE 0.96 05/04/2011 1606   CALCIUM 10.0 03/16/2012 0937   GFRNONAA 48* 03/16/2012 0937   GFRAA 55* 03/16/2012 0937     ASSESSMENT AND PLAN  PAT (paroxysmal atrial tachycardia) Irregular rhythm due to frequent PACs, at time blocked, creating appearance of second degree heart block when she was on Verapamil. She is unaware of palpitations. No convincing evidence of atrial fibrillation is documented. Today on ECG there is a smooth transition from an ectopic atrial rhythm with "spiky" P waves, upright in V1  to fused P waves and then sinus Pwaves. As the arrhythmia is asymptomatic, there is no firm documentation of AFib and she has had severe (transfusion-requiring) blood loss anemia, recommend staying off ASA and warfarin. She is tolerating the low dose metoprolol well.  HTN (hypertension), benign controlled  Hypertrophic cardiomyopathy Nonobstructive; out of proportion to HTN. No family history of sudden death/arrhythmia. No clinical HF.  Follow up yearly.   Eileen Santana

## 2012-05-23 NOTE — Assessment & Plan Note (Addendum)
Irregular rhythm due to frequent PACs, at time blocked, creating appearance of second degree heart block when she was on Verapamil. She is unaware of palpitations. No convincing evidence of atrial fibrillation is documented. Today on ECG there is a smooth transition from an ectopic atrial rhythm with "spiky" P waves, upright in V1 to fused P waves and then sinus Pwaves. As the arrhythmia is asymptomatic, there is no firm documentation of AFib and she has had severe (transfusion-requiring) blood loss anemia, recommend staying off ASA and warfarin. She is tolerating the low dose metoprolol well.

## 2012-05-23 NOTE — Patient Instructions (Signed)
No change in meds.  Will see you in 12 months in our [place] office.

## 2012-05-23 NOTE — Assessment & Plan Note (Signed)
controlled 

## 2012-05-23 NOTE — Assessment & Plan Note (Signed)
Nonobstructive; out of proportion to HTN. No family history of sudden death/arrhythmia. No clinical HF.

## 2012-05-24 ENCOUNTER — Encounter (HOSPITAL_BASED_OUTPATIENT_CLINIC_OR_DEPARTMENT_OTHER): Payer: Medicare Other

## 2012-05-24 DIAGNOSIS — D649 Anemia, unspecified: Secondary | ICD-10-CM

## 2012-05-24 DIAGNOSIS — D509 Iron deficiency anemia, unspecified: Secondary | ICD-10-CM

## 2012-05-24 LAB — PREPARE RBC (CROSSMATCH)

## 2012-05-24 LAB — CBC
MCV: 62.7 fL — ABNORMAL LOW (ref 78.0–100.0)
Platelets: 132 10*3/uL — ABNORMAL LOW (ref 150–400)
RDW: 21.7 % — ABNORMAL HIGH (ref 11.5–15.5)
WBC: 6.1 10*3/uL (ref 4.0–10.5)

## 2012-05-24 NOTE — Addendum Note (Signed)
Addended by: Dennie Maizes on: 05/24/2012 11:53 AM   Modules accepted: Orders

## 2012-05-24 NOTE — Progress Notes (Signed)
Labs drawn today for cbc,type and screen

## 2012-05-25 ENCOUNTER — Encounter (HOSPITAL_BASED_OUTPATIENT_CLINIC_OR_DEPARTMENT_OTHER): Payer: Medicare Other

## 2012-05-25 VITALS — BP 113/63 | HR 79 | Temp 98.0°F | Resp 16

## 2012-05-25 DIAGNOSIS — D509 Iron deficiency anemia, unspecified: Secondary | ICD-10-CM

## 2012-05-25 DIAGNOSIS — D649 Anemia, unspecified: Secondary | ICD-10-CM

## 2012-05-25 MED ORDER — DIPHENHYDRAMINE HCL 25 MG PO CAPS
25.0000 mg | ORAL_CAPSULE | Freq: Once | ORAL | Status: AC
  Administered 2012-05-25: 25 mg via ORAL

## 2012-05-25 MED ORDER — SODIUM CHLORIDE 0.9 % IV SOLN
250.0000 mL | Freq: Once | INTRAVENOUS | Status: AC
  Administered 2012-05-25: 250 mL via INTRAVENOUS

## 2012-05-25 MED ORDER — ACETAMINOPHEN 325 MG PO TABS
ORAL_TABLET | ORAL | Status: AC
  Filled 2012-05-25: qty 2

## 2012-05-25 MED ORDER — FUROSEMIDE 10 MG/ML IJ SOLN
20.0000 mg | Freq: Once | INTRAMUSCULAR | Status: AC
  Administered 2012-05-25: 20 mg via INTRAVENOUS

## 2012-05-25 MED ORDER — ACETAMINOPHEN 325 MG PO TABS
650.0000 mg | ORAL_TABLET | Freq: Once | ORAL | Status: AC
  Administered 2012-05-25: 650 mg via ORAL

## 2012-05-25 MED ORDER — SODIUM CHLORIDE 0.9 % IJ SOLN
10.0000 mL | INTRAMUSCULAR | Status: AC | PRN
  Administered 2012-05-25: 10 mL
  Filled 2012-05-25: qty 10

## 2012-05-25 MED ORDER — FUROSEMIDE 10 MG/ML IJ SOLN
INTRAMUSCULAR | Status: AC
  Filled 2012-05-25: qty 2

## 2012-05-25 MED ORDER — DIPHENHYDRAMINE HCL 25 MG PO CAPS
ORAL_CAPSULE | ORAL | Status: AC
  Filled 2012-05-25: qty 1

## 2012-05-25 NOTE — Progress Notes (Signed)
Tolerated blood transfusion well. 

## 2012-05-26 LAB — TYPE AND SCREEN
ABO/RH(D): A POS
Unit division: 0

## 2012-06-04 ENCOUNTER — Encounter (INDEPENDENT_AMBULATORY_CARE_PROVIDER_SITE_OTHER): Payer: Self-pay | Admitting: Internal Medicine

## 2012-06-04 ENCOUNTER — Ambulatory Visit (INDEPENDENT_AMBULATORY_CARE_PROVIDER_SITE_OTHER): Payer: Medicare Other | Admitting: Internal Medicine

## 2012-06-04 VITALS — BP 124/72 | HR 82 | Temp 97.5°F | Resp 18 | Ht 66.0 in | Wt 153.0 lb

## 2012-06-04 DIAGNOSIS — D649 Anemia, unspecified: Secondary | ICD-10-CM

## 2012-06-04 DIAGNOSIS — K754 Autoimmune hepatitis: Secondary | ICD-10-CM

## 2012-06-04 DIAGNOSIS — K219 Gastro-esophageal reflux disease without esophagitis: Secondary | ICD-10-CM

## 2012-06-04 NOTE — Patient Instructions (Signed)
Can take up to 6 tablets of Tylenol or acetaminophen(325 mg each) daily as needed.

## 2012-06-04 NOTE — Progress Notes (Signed)
Presenting complaint;  Follow up for autoimmune hepatitis.  Subjective:  Patient is 79 year old African female who is here for scheduled visit accompanied by her daughter [name]. She was last seen 4 months ago and was doing well. She had routine blood work at  Dr. Thornton Papas office week before last. Her hemoglobin was down to 7.4 g. She was therefore given 2 units of PRBCs. She states she's been having lower back pain for about 3 weeks. Pain resolved after she received blood transfusion. She has very good appetite. She denies melena rectal bleeding hematuria or vaginal bleeding. She does not take NSAIDs. She has no difficulty sleeping. She also denies abdominal pain or pruritus. She is scheduled for blood work in about 2 weeks prior to her office visit with Dr. Mariel Sleet.  Current Medications: Current Outpatient Prescriptions  Medication Sig Dispense Refill  . acetaminophen (TYLENOL) 325 MG tablet Take 650 mg by mouth every 6 (six) hours as needed. For pain      . ALPRAZolam (XANAX) 0.25 MG tablet Take 0.25 mg by mouth at bedtime as needed for sleep.      Marland Kitchen azaTHIOprine (IMURAN) 50 MG tablet Take 1.5 tablets (75 mg total) by mouth daily.  45 tablet  5  . Coenzyme Q10 (CO Q 10) 100 MG CAPS Take 200 mg by mouth daily.      Marland Kitchen dexlansoprazole (DEXILANT) 60 MG capsule Take 1 capsule (60 mg total) by mouth daily.  90 capsule  3  . diclofenac sodium (VOLTAREN) 1 % GEL Apply 1 application topically as needed.       . diphenhydrAMINE (BENADRYL) 25 mg capsule Take 25 mg by mouth as needed for itching.      . fish oil-omega-3 fatty acids 1000 MG capsule Take 1 g by mouth daily.      . folic acid (FOLVITE) 800 MCG tablet Take 400 mcg by mouth.      . furosemide (LASIX) 20 MG tablet Take 20 mg by mouth daily as needed. For fluid retention      . metoprolol tartrate (LOPRESSOR) 25 MG tablet Take 12.5 mg by mouth 2 (two) times daily.      . Multiple Vitamins-Minerals (MULTIVITAMIN WITH MINERALS) tablet Take  1 tablet by mouth daily.        . predniSONE (DELTASONE) 10 MG tablet Take 10 mg by mouth daily.      Marland Kitchen spironolactone (ALDACTONE) 50 MG tablet Take 50 mg by mouth daily.       . Travoprost, BAK Free, (TRAVATAN) 0.004 % SOLN ophthalmic solution Place 1 drop into both eyes at bedtime.      . ursodiol (ACTIGALL) 250 MG tablet Take 2 tablets (500 mg total) by mouth 2 (two) times daily.  120 tablet  11  . vitamin B-12 (CYANOCOBALAMIN) 1000 MCG tablet Take 1,000 mcg by mouth daily.       No current facility-administered medications for this visit.   Facility-Administered Medications Ordered in Other Visits  Medication Dose Route Frequency Provider Last Rate Last Dose  . darbepoetin alfa-polysorbate (ARANESP) injection 500 mcg  500 mcg Subcutaneous Q21 days Randall An, MD   500 mcg at 11/25/11 1527  . diphenhydrAMINE (BENADRYL) capsule 25 mg  25 mg Oral Q6H PRN Randall An, MD   25 mg at 05/13/11 1610     Objective: Blood pressure 124/72, pulse 82, temperature 97.5 F (36.4 C), temperature source Oral, resp. rate 18, height 5\' 6"  (1.676 m), weight 153 lb (69.4 kg). Patient  is alert and in no acute distress. She has round facies. Conjunctiva is pink. Sclera is nonicteric Oropharyngeal mucosa is normal. No neck masses or thyromegaly noted. Cardiac exam with regular rhythm normal S1 and S2. She has short systolic murmur best heard at LLSB. Lungs are clear to auscultation. Abdomen is symmetrical soft and nontender without organomegaly or masses  No LE edema or clubbing noted.  Labs/studies Results: CBC from 05/15/2012. WBC 6.6, H&H 7.4 and 24.6 and platelet count 170K. LFTs from 03/16/2012 Bilirubin 0.6, AP 61, AST 50, ALT 13 and albumin 3.0   Assessment:  #1. Autoimmune hepatitis. She remains in biochemical remission. Mildly elevated AST with normal ALT would appear to be secondary to anemia/hemolysis. #2. Anemia. She has required 6 units of PRBCs in the last 7 months. She  has history of GI bleed and iron deficiency anemia. But this does not appear to be an issue at the present time. Suspect she has impaired hemopoiesis. #3. GERD. Symptoms well controlled with therapy.   Plan:  She will have blood work in 2 weeks as planned. She will continue azathioprine and Urso at current dose. Office visit in 4 months.

## 2012-06-25 ENCOUNTER — Encounter (HOSPITAL_COMMUNITY): Payer: Medicare Other | Attending: Oncology

## 2012-06-25 DIAGNOSIS — R700 Elevated erythrocyte sedimentation rate: Secondary | ICD-10-CM

## 2012-06-25 DIAGNOSIS — D696 Thrombocytopenia, unspecified: Secondary | ICD-10-CM | POA: Insufficient documentation

## 2012-06-25 DIAGNOSIS — D649 Anemia, unspecified: Secondary | ICD-10-CM

## 2012-06-25 DIAGNOSIS — D509 Iron deficiency anemia, unspecified: Secondary | ICD-10-CM | POA: Insufficient documentation

## 2012-06-25 LAB — COMPREHENSIVE METABOLIC PANEL
Alkaline Phosphatase: 57 U/L (ref 39–117)
BUN: 20 mg/dL (ref 6–23)
CO2: 27 mEq/L (ref 19–32)
GFR calc Af Amer: 55 mL/min — ABNORMAL LOW (ref >90)
GFR calc non Af Amer: 48 mL/min — ABNORMAL LOW (ref >90)
Glucose, Bld: 151 mg/dL — ABNORMAL HIGH (ref 70–99)
Potassium: 3.6 mEq/L (ref 3.5–5.1)
Total Bilirubin: 0.7 mg/dL (ref 0.3–1.2)
Total Protein: 6.6 g/dL (ref 6.0–8.3)

## 2012-06-25 LAB — CBC WITH DIFFERENTIAL/PLATELET
Eosinophils Absolute: 0.1 10*3/uL (ref 0.0–0.7)
Hemoglobin: 8.3 g/dL — ABNORMAL LOW (ref 12.0–15.0)
Lymphocytes Relative: 20 % (ref 12–46)
Lymphs Abs: 0.9 10*3/uL (ref 0.7–4.0)
MCH: 20.7 pg — ABNORMAL LOW (ref 26.0–34.0)
MCV: 68.6 fL — ABNORMAL LOW (ref 78.0–100.0)
Monocytes Relative: 10 % (ref 3–12)
Neutrophils Relative %: 67 % (ref 43–77)
RBC: 4.01 MIL/uL (ref 3.87–5.11)

## 2012-06-25 LAB — LACTATE DEHYDROGENASE: LDH: 235 U/L (ref 94–250)

## 2012-06-25 LAB — SEDIMENTATION RATE: Sed Rate: 50 mm/hr — ABNORMAL HIGH (ref 0–22)

## 2012-06-25 NOTE — Progress Notes (Signed)
Labs drawn today for crp,cbc/diff,cmp,ldh,sed rate

## 2012-06-28 ENCOUNTER — Other Ambulatory Visit (HOSPITAL_COMMUNITY): Payer: Medicare Other

## 2012-06-29 ENCOUNTER — Encounter (HOSPITAL_COMMUNITY): Payer: Self-pay | Admitting: Pharmacy Technician

## 2012-06-29 ENCOUNTER — Encounter (HOSPITAL_BASED_OUTPATIENT_CLINIC_OR_DEPARTMENT_OTHER): Payer: Medicare Other | Admitting: Oncology

## 2012-06-29 VITALS — BP 123/75 | HR 86 | Temp 97.4°F | Resp 16

## 2012-06-29 DIAGNOSIS — D696 Thrombocytopenia, unspecified: Secondary | ICD-10-CM

## 2012-06-29 DIAGNOSIS — D649 Anemia, unspecified: Secondary | ICD-10-CM

## 2012-06-29 NOTE — Progress Notes (Signed)
Autoimmune hepatitis complicated by anemia and thrombocytopenia which may or may not be related but I am concerned they maybe.  She occasionally needs transfusional therapy and feels somewhat better afterwards for a short time.  She had a non-specific bone marrow biopsy in 2012 and I think she may need another since her cytopenias persist. She may or may not have an evolving condition. This has been discussed with her and she will discuss this with her daughter,Cathy, who was not able to be here today.  She will get back with my nurse in the near future and continue f/u with Dr Karilyn Cota.  Today she has no leg swelling and denies back pain or SOB or chest pain. She does have new petechiae on her legs, R greater than L but present bilaterally.  Therefore I have recommended the bone marrow bx and if she continues to have dropping counts, esp.platelets, then I might consider steroid RX if bone marrow non-specific again.

## 2012-06-29 NOTE — Patient Instructions (Addendum)
[facility] Discharge Instructions  RECOMMENDATIONS MADE BY THE CONSULTANT AND ANY TEST RESULTS WILL BE SENT TO YOUR REFERRING PHYSICIAN.  EXAM FINDINGS BY THE PHYSICIAN TODAY AND SIGNS OR SYMPTOMS TO REPORT TO CLINIC OR PRIMARY PHYSICIAN: Discussion by MD.  Your platelets are getting a little low and your anemia is not getting better.  MD feels you need to have a repeat bone marrow biopsy and aspiration and we will get you scheduled for that to be done.  MEDICATIONS PRESCRIBED:  none  INSTRUCTIONS GIVEN AND DISCUSSED: Report increased shortness of breath, fatigue, swelling in lower extremities.  SPECIAL INSTRUCTIONS/FOLLOW-UP: CT guided bone marrow biopsy and follow-up afterwards.  Blood work in 1 month.  Thank you for choosing [facility] to provide your oncology and hematology care.  To afford each patient quality time with our providers, please arrive at least 15 minutes before your scheduled appointment time.  With your help, our goal is to use those 15 minutes to complete the necessary work-up to ensure our physicians have the information they need to help with your evaluation and healthcare recommendations.    Effective January 1st, 2014, we ask that you re-schedule your appointment with our physicians should you arrive 10 or more minutes late for your appointment.  We strive to give you quality time with our providers, and arriving late affects you and other patients whose appointments are after yours.    Again, thank you for choosing [facility].  Our hope is that these requests will decrease the amount of time that you wait before being seen by our physicians.       _____________________________________________________________  Should you have questions after your visit to [facility], please contact our office at [phone] between the hours of 8:30 a.m. and 5:00 p.m.  Voicemails left after 4:30 p.m. will not be  returned until the following business day.  For prescription refill requests, have your pharmacy contact our office with your prescription refill request.

## 2012-07-02 ENCOUNTER — Other Ambulatory Visit: Payer: Self-pay | Admitting: Radiology

## 2012-07-05 ENCOUNTER — Encounter (HOSPITAL_COMMUNITY): Payer: Self-pay

## 2012-07-05 ENCOUNTER — Ambulatory Visit (HOSPITAL_COMMUNITY)
Admission: RE | Admit: 2012-07-05 | Discharge: 2012-07-05 | Disposition: A | Discharge status: Home / Self Care | Payer: Medicare Other | Source: Ambulatory Visit | Attending: Oncology | Admitting: Oncology

## 2012-07-05 DIAGNOSIS — D649 Anemia, unspecified: Secondary | ICD-10-CM

## 2012-07-05 DIAGNOSIS — D696 Thrombocytopenia, unspecified: Secondary | ICD-10-CM | POA: Insufficient documentation

## 2012-07-05 DIAGNOSIS — I10 Essential (primary) hypertension: Secondary | ICD-10-CM | POA: Insufficient documentation

## 2012-07-05 DIAGNOSIS — I509 Heart failure, unspecified: Secondary | ICD-10-CM | POA: Insufficient documentation

## 2012-07-05 DIAGNOSIS — M069 Rheumatoid arthritis, unspecified: Secondary | ICD-10-CM | POA: Insufficient documentation

## 2012-07-05 DIAGNOSIS — D509 Iron deficiency anemia, unspecified: Secondary | ICD-10-CM | POA: Insufficient documentation

## 2012-07-05 DIAGNOSIS — K754 Autoimmune hepatitis: Secondary | ICD-10-CM | POA: Insufficient documentation

## 2012-07-05 LAB — CBC
HCT: 25.2 % — ABNORMAL LOW (ref 36.0–46.0)
Hemoglobin: 7.6 g/dL — ABNORMAL LOW (ref 12.0–15.0)
WBC: 4.8 10*3/uL (ref 4.0–10.5)

## 2012-07-05 LAB — BONE MARROW EXAM

## 2012-07-05 LAB — PROTIME-INR: INR: 1.11 (ref 0.00–1.49)

## 2012-07-05 MED ORDER — SODIUM CHLORIDE 0.9 % IV SOLN
INTRAVENOUS | Status: DC
  Administered 2012-07-05: 10:00:00 via INTRAVENOUS

## 2012-07-05 MED ORDER — MIDAZOLAM HCL 2 MG/2ML IJ SOLN
INTRAMUSCULAR | Status: AC | PRN
  Administered 2012-07-05: 1 mg via INTRAVENOUS

## 2012-07-05 MED ORDER — MIDAZOLAM HCL 2 MG/2ML IJ SOLN
INTRAMUSCULAR | Status: AC
  Filled 2012-07-05: qty 6

## 2012-07-05 MED ORDER — FENTANYL CITRATE 0.05 MG/ML IJ SOLN
INTRAMUSCULAR | Status: AC | PRN
  Administered 2012-07-05: 50 ug via INTRAVENOUS

## 2012-07-05 MED ORDER — FENTANYL CITRATE 0.05 MG/ML IJ SOLN
INTRAMUSCULAR | Status: AC
  Filled 2012-07-05: qty 6

## 2012-07-05 NOTE — H&P (Signed)
Eileen Santana is an 79 y.o. female.   Chief Complaint: Hx autoimmune hepatitis Anemia; thrombocytopenia- transfusions ongoing Bone marrow bx 2012- undetermined thrombocytopenia continues Scheduled now for BM Bx HPI: anemia; HTN; pancytopenia; hepatitis; Rh arthritis; CHF  Past Medical History  Diagnosis Date  . H/O: GI bleed d/t NSAIDS  . Anemia     fe def anemia. Transfusion dependent.  . DJD (degenerative joint disease)   . Hypertension   . Colitis, acute hx of   . Ileitis hx of  . Pancytopenia 09/03/2010  . Sedimentation rate elevation 09/03/2010  . Autoimmune hepatitis     With leukocytoclastic vasculitis  . Heart block AV second degree March 2013  . Bronchitis   . Myopathy     Steroid-induced  . Renal calculus 08/12/2011  . Rheumatoid arthritis(714.0)   . Colon ulcer 04/2010    NSAID related.  . Gastric ulcer 04/2010    NSAID related  . Cancer   . UGI bleed 09/20/2011    Focal area of gastritis oozing of blood.  . Gastric AVM 09/20/2011  . Candida esophagitis 09/20/2011  . CHF (congestive heart failure)   . Paroxysmal atrial fibrillation   . Liver problem   . Pericarditis     2D Echo EF 65%-70%  . Chronic kidney disease     probably stage II to III with a recent acute exacerbation  . Cholestatic hepatitis     Past Surgical History  Procedure Laterality Date  . Colonoscopy  04/2010  . Upper gastrointestinal endoscopy  04/2010  . Esophagogastroduodenoscopy  09/20/2011    Status post APC.  Marland Kitchen Esophagogastroduodenoscopy  09/20/2011    Procedure: ESOPHAGOGASTRODUODENOSCOPY (EGD);  Surgeon: Malissa Hippo, MD;  Location: [facility];  Service: Endoscopy;  Laterality: N/A;  . Cyst removal hand      Elbow    Family History  Problem Relation Age of Onset  . Stroke Mother   . Hypertension Sister   . Hypertension Brother   . Heart disease Mother 49  . Heart disease Father 95  . COPD Brother   . Arthritis Brother   . Osteoporosis Sister    Social History:  reports  that she has been smoking Cigarettes.  She has been smoking about 0.00 packs per day. She has never used smokeless tobacco. She reports that she does not drink alcohol or use illicit drugs.  Allergies:  Allergies  Allergen Reactions  . Iohexol Swelling    IV Dye   . Ivp Dye (Iodinated Diagnostic Agents) Swelling    Hives  . Naprosyn (Naproxen)     Drowsy, hallucinations  . Red Blood Cells Swelling and Dermatitis    With blood transfusion 2012  . Verapamil     Heart block (2nd degree)  . Vicodin (Hydrocodone-Acetaminophen) Other (See Comments)    hallucinations  . Sulfa Antibiotics Rash     (Not in a hospital admission)  Results for orders placed during the hospital encounter of 07/05/12 (from the past 48 hour(s))  CBC     Status: Abnormal   Collection Time    07/05/12  9:30 AM      Result Value Range   WBC 4.8  4.0 - 10.5 K/uL   RBC 3.78 (*) 3.87 - 5.11 MIL/uL   Hemoglobin 7.6 (*) 12.0 - 15.0 g/dL   HCT 41.3 (*) 24.4 - 01.0 %   MCV 66.7 (*) 78.0 - 100.0 fL   MCH 20.1 (*) 26.0 - 34.0 pg   MCHC 30.2  30.0 -  36.0 g/dL   RDW 16.1 (*) 09.6 - 04.5 %   Platelets 119 (*) 150 - 400 K/uL   Comment: SPECIMEN CHECKED FOR CLOTS     REPEATED TO VERIFY  PROTIME-INR     Status: None   Collection Time    07/05/12  9:30 AM      Result Value Range   Prothrombin Time 14.1  11.6 - 15.2 seconds   INR 1.11  0.00 - 1.49   No results found.  Review of Systems  Constitutional: Negative for fever.  Gastrointestinal: Negative for nausea, vomiting and abdominal pain.  Musculoskeletal: Positive for back pain.  Neurological: Positive for weakness. Negative for headaches.    Blood pressure 128/57, pulse 73, temperature 98.6 F (37 C), temperature source Oral, resp. rate 18, height 5\' 6"  (1.676 m), weight 153 lb (69.4 kg), SpO2 98.00%. Physical Exam  Constitutional: She is oriented to person, place, and time. She appears well-developed.  Cardiovascular: Normal rate, regular rhythm and  normal heart sounds.   No murmur heard. Respiratory: Effort normal and breath sounds normal. She has no wheezes.  GI: Soft. Bowel sounds are normal. There is no tenderness.  Musculoskeletal: Normal range of motion.  Uses cane  Neurological: She is alert and oriented to person, place, and time.  Skin: Skin is dry.  Psychiatric: She has a normal mood and affect. Her behavior is normal. Judgment and thought content normal.     Assessment/Plan Thrombocytopenia; anemia Refractory to transfusion Scheduled for BM bx now Pt and family aware of procedure benefits and risks and agreeable to proceed Consent signed and in chart  Jaelon Gatley A 07/05/2012, 10:21 AM

## 2012-07-05 NOTE — H&P (Signed)
Agree with PA note.    Signed,  Iktan Aikman K. Harshaan Whang, MD Vascular & Interventional Radiologist [facility]  

## 2012-07-05 NOTE — Procedures (Signed)
Interventional Radiology Procedure Note  Procedure: CT guided aspirate and core biopsy of right iliac bone Complications: None Recommendations: - Bedrest supine x 2 hrs - Hydrocodone PRN  Pain - Follow biopsy results  Signed,  Fatin Bachicha K. Zyrion Coey, MD Vascular & Interventional Radiologist [facility]  

## 2012-07-20 ENCOUNTER — Encounter (HOSPITAL_COMMUNITY): Payer: Medicare Other | Attending: Oncology | Admitting: Oncology

## 2012-07-20 ENCOUNTER — Ambulatory Visit (HOSPITAL_COMMUNITY): Payer: Medicare Other | Admitting: Oncology

## 2012-07-20 VITALS — BP 127/68 | HR 92 | Temp 98.6°F | Resp 20 | Wt 152.3 lb

## 2012-07-20 DIAGNOSIS — D61818 Other pancytopenia: Secondary | ICD-10-CM | POA: Insufficient documentation

## 2012-07-20 DIAGNOSIS — D509 Iron deficiency anemia, unspecified: Secondary | ICD-10-CM | POA: Insufficient documentation

## 2012-07-20 LAB — CBC WITH DIFFERENTIAL/PLATELET
Basophils Absolute: 0 10*3/uL (ref 0.0–0.1)
Basophils Relative: 1 % (ref 0–1)
Eosinophils Absolute: 0 10*3/uL (ref 0.0–0.7)
Lymphs Abs: 0.4 10*3/uL — ABNORMAL LOW (ref 0.7–4.0)
MCH: 20.3 pg — ABNORMAL LOW (ref 26.0–34.0)
MCHC: 30.3 g/dL (ref 30.0–36.0)
MCV: 67.2 fL — ABNORMAL LOW (ref 78.0–100.0)
Monocytes Absolute: 0.2 10*3/uL (ref 0.1–1.0)
Neutro Abs: 2.8 10*3/uL (ref 1.7–7.7)
RBC: 3.54 MIL/uL — ABNORMAL LOW (ref 3.87–5.11)
RDW: 22.5 % — ABNORMAL HIGH (ref 11.5–15.5)
WBC: 3.4 10*3/uL — ABNORMAL LOW (ref 4.0–10.5)

## 2012-07-20 LAB — IRON AND TIBC
Iron: 49 ug/dL (ref 42–135)
UIBC: 340 ug/dL (ref 125–400)

## 2012-07-20 NOTE — Progress Notes (Signed)
Eileen Courier, MD [address] [place] [address]  Pancytopenia - Plan: CBC with Differential, CBC with Differential, Iron and TIBC, Ferritin  Iron deficiency anemia, unspecified - Plan: CBC with Differential, Iron and TIBC, Ferritin  CURRENT THERAPY: Observation  INTERVAL HISTORY: Eileen Santana 79 y.o. female returns for  regular  visit for followup of Autoimmune hepatitis complicated by anemia and thrombocytopenia which may or may not be related   The patient's bone marrow aspirate and biopsy was performed on 07/05/2012 shows a hypercellular bone marrow for age with trilineage hematopoiesis. Absent iron stores. Microcytic, hypochromic anemia and thrombocytopenia note on peripheral blood. The bone marrow was hypercellular for age with trilineage hematopoiesis including relative abundance of megakaryocytes. There are subtle dyspoietic changes present, primarily involving the erythroid cell line associated with absent iron stores and lack of rings sideroblasts.  The overall changes are not consider specific antigen is suspected that some of the findings may be related to iron deficiency.  I personally reviewed and went over laboratory results with the patient.  Blood work from 07/05/2012 the white blood cell count 4.8, hemoglobin 7.6 g/dL, MCV 82.9 and therefore microcytic, RDW is 23.9, platelet count 119,000.  As a result of the bone marrow tests and a microcytic anemia, perform repeat CBC with iron studies today including a TIBC, ferritin, and serum iron.  She admits to low back pain that sounds musculoskeletal in cause. She reports it is lateral to her spine. She points in her low back region. She admits that it improves on his own when lying down. It worsens with movement and activities. After reviewing the patient's chart, she reports this similar pain back in June when her hemoglobin was low and it improved significantly after a blood transfusion. I question whether  this is playing a role in her current discomfort. I have declined getting her pain medicine at this point in time until we're able to further evaluate the discomfort.  Hematologically, the patient denies any complaints and ROS questioning is negative.   Past Medical History  Diagnosis Date  . H/O: GI bleed d/t NSAIDS  . Anemia     fe def anemia. Transfusion dependent.  . DJD (degenerative joint disease)   . Hypertension   . Colitis, acute hx of   . Ileitis hx of  . Pancytopenia 09/03/2010  . Sedimentation rate elevation 09/03/2010  . Autoimmune hepatitis     With leukocytoclastic vasculitis  . Heart block AV second degree March 2013  . Bronchitis   . Myopathy     Steroid-induced  . Renal calculus 08/12/2011  . Rheumatoid arthritis(714.0)   . Colon ulcer 04/2010    NSAID related.  . Gastric ulcer 04/2010    NSAID related  . Cancer   . UGI bleed 09/20/2011    Focal area of gastritis oozing of blood.  . Gastric AVM 09/20/2011  . Candida esophagitis 09/20/2011  . CHF (congestive heart failure)   . Paroxysmal atrial fibrillation   . Liver problem   . Pericarditis     2D Echo EF 65%-70%  . Chronic kidney disease     probably stage II to III with a recent acute exacerbation  . Cholestatic hepatitis     has Pancytopenia; Sedimentation rate elevation; Elevated liver enzymes; GI bleed; Microcytic anemia; Orthostatic hypotension; Bradycardia; Renal failure; Autoimmune hepatitis; HTN (hypertension), benign; Abdominal pain, acute, generalized; Nausea; Diarrhea; Hypomagnesemia; Hypokalemia; Prolonged Q-T interval on ECG; UTI (urinary tract infection); Gallstones; Thrombocytopenia; Renal  calculus; Chronic back pain; Leg pain; Rheumatoid arthritis; Acute renal insufficiency; Hypoalbuminemia; Guaiac positive stools; UGI bleed; Gastric AVM; Candida esophagitis; Anemia; PAT (paroxysmal atrial tachycardia); and Hypertrophic cardiomyopathy on her problem list.     is allergic to iohexol; ivp dye;  naprosyn; red blood cells; verapamil; vicodin; and sulfa antibiotics.  Eileen Santana does not currently have medications on file.  Past Surgical History  Procedure Laterality Date  . Colonoscopy  04/2010  . Upper gastrointestinal endoscopy  04/2010  . Esophagogastroduodenoscopy  09/20/2011    Status post APC.  Marland Kitchen Esophagogastroduodenoscopy  09/20/2011    Procedure: ESOPHAGOGASTRODUODENOSCOPY (EGD);  Surgeon: Malissa Hippo, MD;  Location: [facility];  Service: Endoscopy;  Laterality: N/A;  . Cyst removal hand      Elbow    Denies any headaches, dizziness, double vision, fevers, chills, night sweats, nausea, vomiting, diarrhea, constipation, chest pain, heart palpitations, shortness of breath, blood in stool, black tarry stool, urinary pain, urinary burning, urinary frequency, hematuria.   PHYSICAL EXAMINATION  ECOG PERFORMANCE STATUS: 1 - Symptomatic but completely ambulatory  Filed Vitals:   07/20/12 1413  BP: 127/68  Pulse: 92  Temp: 98.6 F (37 C)  Resp: 20    GENERAL:alert, no distress, well nourished, well developed, comfortable, cooperative and smiling SKIN: skin color, texture, turgor are normal, no rashes or significant lesions HEAD: Normocephalic, No masses, lesions, tenderness or abnormalities EYES: normal, PERRLA, EOMI, Conjunctiva are pink and non-injected EARS: External ears normal OROPHARYNX:mucous membranes are moist  NECK: supple, no adenopathy, trachea midline LYMPH:  no palpable lymphadenopathy BREAST:not examined LUNGS: clear to auscultation  HEART: regular rate & rhythm, no murmurs, no gallops, S1 normal and S2 normal ABDOMEN:abdomen soft, non-tender and normal bowel sounds BACK: Back symmetric, no curvature., No CVA tenderness EXTREMITIES:less then 2 second capillary refill, no joint deformities, effusion, or inflammation, no edema, no skin discoloration, no clubbing, no cyanosis  NEURO: alert & oriented x 3 with fluent speech, no focal motor/sensory  deficits, gait normal with a cane for assistance for stability.    LABORATORY DATA: CBC    Component Value Date/Time   WBC 4.8 07/05/2012 0930   RBC 3.78* 07/05/2012 0930   HGB 7.6* 07/05/2012 0930   HCT 25.2* 07/05/2012 0930   PLT 119* 07/05/2012 0930   MCV 66.7* 07/05/2012 0930   MCH 20.1* 07/05/2012 0930   MCHC 30.2 07/05/2012 0930   RDW 23.9* 07/05/2012 0930   LYMPHSABS 0.9 06/25/2012 0913   MONOABS 0.5 06/25/2012 0913   EOSABS 0.1 06/25/2012 0913   BASOSABS 0.0 06/25/2012 0913      Chemistry      Component Value Date/Time   NA 139 06/25/2012 0913   K 3.6 06/25/2012 0913   CL 101 06/25/2012 0913   CO2 27 06/25/2012 0913   BUN 20 06/25/2012 0913   CREATININE 1.08 06/25/2012 0913   CREATININE 0.96 05/04/2011 1606      Component Value Date/Time   CALCIUM 10.2 06/25/2012 0913   ALKPHOS 57 06/25/2012 0913   AST 54* 06/25/2012 0913   ALT 12 06/25/2012 0913   BILITOT 0.7 06/25/2012 0913     Lab Results  Component Value Date   FERRITIN 17 09/23/2011    PATHOLOGY:  07/05/2012  Diagnosis Bone Marrow, Aspirate,Biopsy, and Clot, right iliac - HYPERCELLULAR BONE MARROW FOR AGE WITH TRILINEAGE HEMATOPOIESIS. - ABSENT IRON STORES. - SEE COMMENT. PERIPHERAL BLOOD: - MICROCYTIC- HYPOCHROMIC ANEMIA. - THROMBOCYTOPENIA. Diagnosis Note The bone marrow is hypercellular for age with trilineage hematopoiesis  including relative abundance of megakaryocytes. There are subtle dyspoietic changes present, primarily involving the erythroid cell line associated with absent iron stores and lack of ringed sideroblasts. The overall changes are not considered specific and I suspect that some of the findings may be related to iron deficiency. Correlation with cytogenetic studies is strongly recommended. Guerry Bruin MD Pathologist, Electronic Signature (Case signed 07/09/2012)     ASSESSMENT:  1. Autoimmune hepatitis complicated by anemia and thrombocytopenia which may or may not be related. Negative bone marrow  aspiration and biopsy on 07/05/2012. 2. Microcytic anemia, ?iron deficiency anemia.  Absent iron stores on Bone marrow aspiration and biopsy on 07/05/2012. 3. Thrombocytopenia, stable.   Patient Active Problem List   Diagnosis Date Noted  . PAT (paroxysmal atrial tachycardia) 05/23/2012  . Hypertrophic cardiomyopathy 05/23/2012  . Anemia 11/25/2011  . UGI bleed 09/20/2011  . Gastric AVM 09/20/2011  . Candida esophagitis 09/20/2011  . Chronic back pain 09/18/2011  . Leg pain 09/18/2011  . Rheumatoid arthritis 09/18/2011  . Acute renal insufficiency 09/18/2011  . Hypoalbuminemia 09/18/2011  . Guaiac positive stools 09/18/2011  . Renal calculus 08/12/2011  . Abdominal pain, acute, generalized 08/10/2011  . Nausea 08/10/2011  . Diarrhea 08/10/2011  . Hypomagnesemia 08/10/2011  . Hypokalemia 08/10/2011  . Prolonged Q-T interval on ECG 08/10/2011  . UTI (urinary tract infection) 08/10/2011  . Gallstones 08/10/2011  . Thrombocytopenia 08/10/2011  . Orthostatic hypotension 03/05/2011  . Bradycardia 03/05/2011  . Renal failure 03/05/2011  . Autoimmune hepatitis 03/05/2011  . HTN (hypertension), benign 03/05/2011  . Elevated liver enzymes 02/17/2011  . GI bleed 02/17/2011  . Microcytic anemia 02/17/2011  . Pancytopenia 09/03/2010  . Sedimentation rate elevation 09/03/2010     PLAN:  1. I personally reviewed and went over laboratory results with the patient. 2. I personally reviewed and went over pathology results with the patient. 3. Labs today: CBC diff, Iron/TIBC, Ferritin 4. Will administer IV Feraheme versus PRBCs depending on results.  5. Labs every 3 weeks: CBC diff 6. Labs every 6 weeks: Ferritin, Iron/TIBC 7. Return in 2 months for follow-up   THERAPY PLAN:  We'll perform laboratory work today including a CBC with iron studies. If her iron studies show iron deficiency, we'll give her IV Feraheme versus a packed red blood cell transfusion depending on her symptomatology.  We'll perform laboratory work at regular intervals and see her back in approximately 2 months time. She certainly is a complicated patient with regards to her illnesses. She is to continue to follow GI as directed. Depending on her iron test workup, she may be a candidate for steroids per Dr. Mariel Sleet to previous note dated 06/29/2012.  All questions were answered. The patient knows to call the clinic with any problems, questions or concerns. We can certainly see the patient much sooner if necessary.  Patient and plan discussed with Dr. Erline Hau and he is in agreement with the aforementioned.   KEFALAS,THOMAS

## 2012-07-20 NOTE — Patient Instructions (Addendum)
[facility] Discharge Instructions  RECOMMENDATIONS MADE BY THE CONSULTANT AND ANY TEST RESULTS WILL BE SENT TO YOUR REFERRING PHYSICIAN.  EXAM FINDINGS BY THE PHYSICIAN TODAY AND SIGNS OR SYMPTOMS TO REPORT TO CLINIC OR PRIMARY PHYSICIAN: Exam and discussion by PA.  We will check some additional blood work. If there are any problems we will let you know.  MEDICATIONS PRESCRIBED:  none  INSTRUCTIONS GIVEN AND DISCUSSED: Report increased fatigue, shortness of breath or other problems.  SPECIAL INSTRUCTIONS/FOLLOW-UP: Blood work every 3 weeks and to be seen in follow-up in 2 months.  Thank you for choosing [facility] to provide your oncology and hematology care.  To afford each patient quality time with our providers, please arrive at least 15 minutes before your scheduled appointment time.  With your help, our goal is to use those 15 minutes to complete the necessary work-up to ensure our physicians have the information they need to help with your evaluation and healthcare recommendations.    Effective January 1st, 2014, we ask that you re-schedule your appointment with our physicians should you arrive 10 or more minutes late for your appointment.  We strive to give you quality time with our providers, and arriving late affects you and other patients whose appointments are after yours.    Again, thank you for choosing [facility].  Our hope is that these requests will decrease the amount of time that you wait before being seen by our physicians.       _____________________________________________________________  Should you have questions after your visit to [facility], please contact our office at [phone] between the hours of 8:30 a.m. and 5:00 p.m.  Voicemails left after 4:30 p.m. will not be returned until the following business day.  For prescription refill requests, have your pharmacy contact our office with your  prescription refill request.

## 2012-07-20 NOTE — Addendum Note (Signed)
Addended by: Evelena Leyden on: 07/20/2012 05:08 PM   Modules accepted: Orders

## 2012-07-21 LAB — FERRITIN: Ferritin: 11 ng/mL (ref 10–291)

## 2012-07-23 ENCOUNTER — Other Ambulatory Visit (HOSPITAL_COMMUNITY): Payer: Self-pay | Admitting: Oncology

## 2012-07-23 DIAGNOSIS — D509 Iron deficiency anemia, unspecified: Secondary | ICD-10-CM

## 2012-07-24 ENCOUNTER — Encounter (HOSPITAL_BASED_OUTPATIENT_CLINIC_OR_DEPARTMENT_OTHER): Payer: Medicare Other

## 2012-07-24 VITALS — BP 120/70 | HR 94 | Temp 98.4°F | Resp 16

## 2012-07-24 DIAGNOSIS — D509 Iron deficiency anemia, unspecified: Secondary | ICD-10-CM

## 2012-07-24 MED ORDER — SODIUM CHLORIDE 0.9 % IV SOLN
1020.0000 mg | Freq: Once | INTRAVENOUS | Status: AC
  Administered 2012-07-24: 1020 mg via INTRAVENOUS
  Filled 2012-07-24: qty 34

## 2012-07-24 NOTE — Progress Notes (Signed)
Tolerated fereheme infusion well. 

## 2012-07-30 ENCOUNTER — Other Ambulatory Visit (HOSPITAL_COMMUNITY): Payer: Self-pay | Admitting: Oncology

## 2012-07-30 ENCOUNTER — Telehealth (HOSPITAL_COMMUNITY): Payer: Self-pay

## 2012-07-30 ENCOUNTER — Other Ambulatory Visit (INDEPENDENT_AMBULATORY_CARE_PROVIDER_SITE_OTHER): Payer: Self-pay | Admitting: Internal Medicine

## 2012-07-30 DIAGNOSIS — D509 Iron deficiency anemia, unspecified: Secondary | ICD-10-CM

## 2012-07-30 MED ORDER — PREDNISONE 10 MG PO TABS: 10.0000 mg | ORAL_TABLET | Freq: Every day | ORAL | Status: DC

## 2012-07-30 NOTE — Telephone Encounter (Signed)
Scheduled for blood work tomorrow @ 11am.  Cathy notified.

## 2012-07-30 NOTE — Telephone Encounter (Signed)
Let's have ger come in for CBC.  Order placed.

## 2012-07-30 NOTE — Telephone Encounter (Signed)
Call from daughter [name].  States that her mom has been complaining with a lot of back pain.  States that this is what happens when her hemoglobin gets low and wonders if she needs blood?

## 2012-07-31 ENCOUNTER — Encounter (HOSPITAL_BASED_OUTPATIENT_CLINIC_OR_DEPARTMENT_OTHER): Payer: Medicare Other

## 2012-07-31 DIAGNOSIS — D509 Iron deficiency anemia, unspecified: Secondary | ICD-10-CM

## 2012-07-31 LAB — CBC
HCT: 29.2 % — ABNORMAL LOW (ref 36.0–46.0)
Hemoglobin: 8.5 g/dL — ABNORMAL LOW (ref 12.0–15.0)
MCHC: 29.1 g/dL — ABNORMAL LOW (ref 30.0–36.0)
WBC: 3.8 10*3/uL — ABNORMAL LOW (ref 4.0–10.5)

## 2012-07-31 NOTE — Progress Notes (Signed)
Labs drawn today for cbc 

## 2012-08-08 ENCOUNTER — Encounter (HOSPITAL_COMMUNITY): Payer: Medicare Other

## 2012-08-10 ENCOUNTER — Encounter (HOSPITAL_COMMUNITY): Payer: Medicare Other

## 2012-08-13 ENCOUNTER — Encounter (HOSPITAL_COMMUNITY): Payer: Medicare Other | Attending: Oncology

## 2012-08-13 DIAGNOSIS — D509 Iron deficiency anemia, unspecified: Secondary | ICD-10-CM | POA: Insufficient documentation

## 2012-08-13 DIAGNOSIS — D649 Anemia, unspecified: Secondary | ICD-10-CM

## 2012-08-13 DIAGNOSIS — D61818 Other pancytopenia: Secondary | ICD-10-CM | POA: Insufficient documentation

## 2012-08-13 LAB — CBC WITH DIFFERENTIAL/PLATELET
Lymphs Abs: 1 10*3/uL (ref 0.7–4.0)
Monocytes Relative: 17 % — ABNORMAL HIGH (ref 3–12)
Neutro Abs: 1.4 10*3/uL — ABNORMAL LOW (ref 1.7–7.7)
Neutrophils Relative %: 46 % (ref 43–77)
RBC: 4.06 MIL/uL (ref 3.87–5.11)
WBC: 3 10*3/uL — ABNORMAL LOW (ref 4.0–10.5)

## 2012-08-13 NOTE — Progress Notes (Signed)
Labs drawn today for cbc/diff 

## 2012-08-14 ENCOUNTER — Other Ambulatory Visit (HOSPITAL_COMMUNITY): Payer: Medicare Other

## 2012-08-23 ENCOUNTER — Other Ambulatory Visit (INDEPENDENT_AMBULATORY_CARE_PROVIDER_SITE_OTHER): Payer: Self-pay | Admitting: Internal Medicine

## 2012-08-23 DIAGNOSIS — K754 Autoimmune hepatitis: Secondary | ICD-10-CM

## 2012-08-23 MED ORDER — AZATHIOPRINE 50 MG PO TABS: 75.0000 mg | ORAL_TABLET | Freq: Every day | ORAL | Status: DC

## 2012-08-29 ENCOUNTER — Encounter (HOSPITAL_COMMUNITY): Payer: Medicare Other

## 2012-08-30 ENCOUNTER — Other Ambulatory Visit (INDEPENDENT_AMBULATORY_CARE_PROVIDER_SITE_OTHER): Payer: Self-pay | Admitting: Internal Medicine

## 2012-08-31 ENCOUNTER — Encounter (HOSPITAL_COMMUNITY): Payer: Medicare Other

## 2012-09-04 ENCOUNTER — Other Ambulatory Visit (HOSPITAL_COMMUNITY): Payer: Medicare Other

## 2012-09-19 ENCOUNTER — Encounter (HOSPITAL_COMMUNITY): Payer: Medicare Other | Attending: Oncology

## 2012-09-19 DIAGNOSIS — D61818 Other pancytopenia: Secondary | ICD-10-CM | POA: Insufficient documentation

## 2012-09-19 DIAGNOSIS — D509 Iron deficiency anemia, unspecified: Secondary | ICD-10-CM | POA: Insufficient documentation

## 2012-09-19 LAB — CBC WITH DIFFERENTIAL/PLATELET
Eosinophils Relative: 4 % (ref 0–5)
HCT: 28 % — ABNORMAL LOW (ref 36.0–46.0)
Lymphocytes Relative: 33 % (ref 12–46)
Lymphs Abs: 1.2 10*3/uL (ref 0.7–4.0)
MCV: 78 fL (ref 78.0–100.0)
Monocytes Absolute: 0.5 10*3/uL (ref 0.1–1.0)
Platelets: 126 10*3/uL — ABNORMAL LOW (ref 150–400)
RBC: 3.59 MIL/uL — ABNORMAL LOW (ref 3.87–5.11)
WBC: 3.5 10*3/uL — ABNORMAL LOW (ref 4.0–10.5)

## 2012-09-19 LAB — IRON AND TIBC
Iron: 25 ug/dL — ABNORMAL LOW (ref 42–135)
Saturation Ratios: 7 % — ABNORMAL LOW (ref 20–55)
UIBC: 318 ug/dL (ref 125–400)

## 2012-09-19 LAB — FERRITIN: Ferritin: 27 ng/mL (ref 10–291)

## 2012-09-19 NOTE — Progress Notes (Signed)
Labs drawn today for cbc/diff,ferr,Iron and IBC 

## 2012-09-20 ENCOUNTER — Other Ambulatory Visit (HOSPITAL_COMMUNITY): Payer: Self-pay | Admitting: Oncology

## 2012-09-21 ENCOUNTER — Encounter (HOSPITAL_BASED_OUTPATIENT_CLINIC_OR_DEPARTMENT_OTHER): Payer: Medicare Other

## 2012-09-21 ENCOUNTER — Encounter (HOSPITAL_COMMUNITY): Payer: Self-pay

## 2012-09-21 ENCOUNTER — Encounter (HOSPITAL_COMMUNITY): Payer: Medicare Other

## 2012-09-21 VITALS — BP 120/67 | HR 85 | Temp 98.3°F | Resp 16 | Wt 152.0 lb

## 2012-09-21 DIAGNOSIS — D731 Hypersplenism: Secondary | ICD-10-CM | POA: Insufficient documentation

## 2012-09-21 DIAGNOSIS — D696 Thrombocytopenia, unspecified: Secondary | ICD-10-CM

## 2012-09-21 DIAGNOSIS — D509 Iron deficiency anemia, unspecified: Secondary | ICD-10-CM

## 2012-09-21 DIAGNOSIS — Z23 Encounter for immunization: Secondary | ICD-10-CM

## 2012-09-21 DIAGNOSIS — D500 Iron deficiency anemia secondary to blood loss (chronic): Secondary | ICD-10-CM

## 2012-09-21 MED ORDER — SODIUM CHLORIDE 0.9 % IV SOLN
1020.0000 mg | Freq: Once | INTRAVENOUS | Status: AC
  Administered 2012-09-21: 1020 mg via INTRAVENOUS
  Filled 2012-09-21: qty 34

## 2012-09-21 MED ORDER — INFLUENZA VAC SPLIT QUAD 0.5 ML IM SUSP
0.5000 mL | INTRAMUSCULAR | Status: AC
  Administered 2012-09-21: 0.5 mL via INTRAMUSCULAR
  Filled 2012-09-21: qty 0.5

## 2012-09-21 NOTE — Progress Notes (Signed)
[facility] OFFICE PROGRESS NOTE  [name], MD [address] [place] [address]  DIAGNOSIS: Hypersplenism  Thrombocytopenia  Iron deficiency anemia secondary to blood loss (chronic)  Chief Complaint  Patient presents with  . Follow-up    CURRENT THERAPY: Intravenous iron 07/24/2012, RN S5 100 mcg subcutaneous every 3 weeks, last treatment 08/24/2012  INTERVAL HISTORY: Annleigh Bernath 79 y.o. female returns for followup of mixed anemia due to chronic blood loss from AVM in the stomach and hypersplenism. After previous iron infusion her back pain completely dissipated. Appetite has been good with no melena, hematochezia, hematuria, vaginal bleeding, or epistaxis. She denies any fever, night sweats, or craving for ice. Appetite is good with no nausea, vomiting, hematemesis, skin rash, or worsening joint discomfort.   MEDICAL HISTORY: Past Medical History  Diagnosis Date  . H/O: GI bleed d/t NSAIDS  . Anemia     fe def anemia. Transfusion dependent.  . DJD (degenerative joint disease)   . Hypertension   . Colitis, acute hx of   . Ileitis hx of  . Pancytopenia 09/03/2010  . Sedimentation rate elevation 09/03/2010  . Autoimmune hepatitis     With leukocytoclastic vasculitis  . Heart block AV second degree March 2013  . Bronchitis   . Myopathy     Steroid-induced  . Renal calculus 08/12/2011  . Rheumatoid arthritis(714.0)   . Colon ulcer 04/2010    NSAID related.  . Gastric ulcer 04/2010    NSAID related  . Cancer   . UGI bleed 09/20/2011    Focal area of gastritis oozing of blood.  . Gastric AVM 09/20/2011  . Candida esophagitis 09/20/2011  . CHF (congestive heart failure)   . Paroxysmal atrial fibrillation   . Liver problem   . Pericarditis     2D Echo EF 65%-70%  . Chronic kidney disease     probably stage II to III with a recent acute exacerbation  . Cholestatic hepatitis     INTERIM HISTORY: has Pancytopenia; Sedimentation rate  elevation; Elevated liver enzymes; GI bleed; Microcytic anemia; Orthostatic hypotension; Bradycardia; Renal failure; Autoimmune hepatitis; HTN (hypertension), benign; Abdominal pain, acute, generalized; Nausea; Diarrhea; Hypomagnesemia; Hypokalemia; Prolonged Q-T interval on ECG; UTI (urinary tract infection); Gallstones; Thrombocytopenia; Renal calculus; Chronic back pain; Leg pain; Rheumatoid arthritis; Acute renal insufficiency; Hypoalbuminemia; Guaiac positive stools; UGI bleed; Gastric AVM; Candida esophagitis; Anemia; PAT (paroxysmal atrial tachycardia); Hypertrophic cardiomyopathy; Hypersplenism; and Iron deficiency anemia secondary to blood loss (chronic) on her problem list.    ALLERGIES:  is allergic to iohexol; ivp dye; naprosyn; red blood cells; verapamil; vicodin; and sulfa antibiotics.  MEDICATIONS: has a current medication list which includes the following prescription(s): acetaminophen, azathioprine, vitamin d, co q 10, dexlansoprazole, diclofenac sodium, fish oil-omega-3 fatty acids, folic acid, furosemide, magnesium oxide, metoprolol tartrate, multivitamin with minerals, prednisone, spironolactone, travoprost (bak free), ursodiol, and vitamin b-12, and the following Facility-Administered Medications: darbepoetin alfa-polysorbate, diphenhydramine, and ferumoxytol.  SURGICAL HISTORY:  Past Surgical History  Procedure Laterality Date  . Colonoscopy  04/2010  . Upper gastrointestinal endoscopy  04/2010  . Esophagogastroduodenoscopy  09/20/2011    Status post APC.  Marland Kitchen Esophagogastroduodenoscopy  09/20/2011    Procedure: ESOPHAGOGASTRODUODENOSCOPY (EGD);  Surgeon: Malissa Hippo, MD;  Location: [facility];  Service: Endoscopy;  Laterality: N/A;  . Cyst removal hand      Elbow    REVIEW OF SYSTEMS:  Other than that discussed above is noncontributory.  PHYSICAL EXAMINATION: ECOG PERFORMANCE STATUS: 1 -  Symptomatic but completely ambulatory  Blood pressure 120/67, pulse 85,  temperature 98.3 F (36.8 C), temperature source Oral, resp. rate 16, weight 152 lb (68.947 kg).  GENERAL:alert, no distress and comfortable SKIN: skin color, texture, turgor are normal, no rashes or significant lesions EYES: normal, Conjunctiva are pink and non-injected, sclera clear OROPHARYNX:no exudate, no erythema and lips, buccal mucosa, and tongue normal  NECK: supple, thyroid normal size, non-tender, without nodularity CHEST: No breast masses. Normal AP diameter. LYMPH:  no palpable lymphadenopathy in the cervical, axillary or inguinal LUNGS: clear to auscultation and percussion with normal breathing effort HEART: regular rate & rhythm and no murmurs and no lower extremity edema ABDOMEN:abdomen soft, non-tender and normal bowel sounds Musculoskeletal:no cyanosis of digits and no clubbing  NEURO: alert & oriented x 3 with fluent speech, no focal motor/sensory deficits   LABORATORY DATA: Infusion on 09/19/2012  Component Date Value Range Status  . WBC 09/19/2012 3.5* 4.0 - 10.5 K/uL Final  . RBC 09/19/2012 3.59* 3.87 - 5.11 MIL/uL Final  . Hemoglobin 09/19/2012 8.9* 12.0 - 15.0 g/dL Final  . HCT 16/10/9602 28.0* 36.0 - 46.0 % Final  . MCV 09/19/2012 78.0  78.0 - 100.0 fL Final  . MCH 09/19/2012 24.8* 26.0 - 34.0 pg Final  . MCHC 09/19/2012 31.8  30.0 - 36.0 g/dL Final  . RDW 54/09/8117 21.4* 11.5 - 15.5 % Final  . Platelets 09/19/2012 126* 150 - 400 K/uL Final  . Neutrophils Relative % 09/19/2012 47  43 - 77 % Final  . Neutro Abs 09/19/2012 1.6* 1.7 - 7.7 K/uL Final  . Lymphocytes Relative 09/19/2012 33  12 - 46 % Final  . Lymphs Abs 09/19/2012 1.2  0.7 - 4.0 K/uL Final  . Monocytes Relative 09/19/2012 15* 3 - 12 % Final  . Monocytes Absolute 09/19/2012 0.5  0.1 - 1.0 K/uL Final  . Eosinophils Relative 09/19/2012 4  0 - 5 % Final  . Eosinophils Absolute 09/19/2012 0.1  0.0 - 0.7 K/uL Final  . Basophils Relative 09/19/2012 1  0 - 1 % Final  . Basophils Absolute 09/19/2012  0.0  0.0 - 0.1 K/uL Final  . Iron 09/19/2012 25* 42 - 135 ug/dL Final  . TIBC 14/78/2956 343  250 - 470 ug/dL Final  . Saturation Ratios 09/19/2012 7* 20 - 55 % Final  . UIBC 09/19/2012 318  125 - 400 ug/dL Final   Performed at [facility]  . Ferritin 09/19/2012 27  10 - 291 ng/mL Final   Performed at [facility]     Urinalysis    Component Value Date/Time   COLORURINE YELLOW 08/10/2011 1200   APPEARANCEUR CLEAR 08/10/2011 1200   LABSPEC 1.010 08/10/2011 1200   PHURINE 6.0 08/10/2011 1200   GLUCOSEU NEGATIVE 08/10/2011 1200   HGBUR SMALL* 08/10/2011 1200   BILIRUBINUR SMALL* 08/10/2011 1200   KETONESUR NEGATIVE 08/10/2011 1200   PROTEINUR NEGATIVE 08/10/2011 1200   UROBILINOGEN 0.2 08/10/2011 1200   NITRITE POSITIVE* 08/10/2011 1200   LEUKOCYTESUR SMALL* 08/10/2011 1200    RADIOGRAPHIC STUDIES: No results found.  ASSESSMENT: #1. Next anemia secondary to hypersplenism and chronic blood loss due to AV malformations in the stomach #2 autoimmune hepatitis. #3. Second-degree heart block #4 chronic kidney disease, stage II to III.   PLAN: #1. Intravenous Feraheme 1020 mg intravenously today #2. Return in 3 weeks with blood tests done one day before rechecking CBC and ferritin. If ferritin is over 100, Arimidex will be given. If ferritin is still  low additional intravenous iron will be administered.   All questions were answered. The patient knows to call the clinic with any problems, questions or concerns. We can certainly see the patient much sooner if necessary.  The patient and plan discussed with Alla German A and he is in agreement with the aforementioned.  I spent 30 minutes counseling the patient face to face. The total time spent in the appointment was 25 minutes.    Maurilio Lovely, MD 09/21/2012 2:06 PM

## 2012-09-21 NOTE — Patient Instructions (Addendum)
[facility] Discharge Instructions  RECOMMENDATIONS MADE BY THE CONSULTANT AND ANY TEST RESULTS WILL BE SENT TO YOUR REFERRING PHYSICIAN.  EXAM FINDINGS BY THE PHYSICIAN TODAY AND SIGNS OR SYMPTOMS TO REPORT TO CLINIC OR PRIMARY PHYSICIAN: Exam and findings as discussed by Dr. Zigmund Daniel. We will give you iron today and will see you back in 3 weeks with blood work and will see you after we get the results back.  Report increased fatigue or shortness of breath.  MEDICATIONS PRESCRIBED:  None  INSTRUCTIONS/FOLLOW-UP: Blood work in 3 weeks if ferritin is more than 100 we will give you aranesp.  Thank you for choosing [facility] to provide your oncology and hematology care.  To afford each patient quality time with our providers, please arrive at least 15 minutes before your scheduled appointment time.  With your help, our goal is to use those 15 minutes to complete the necessary work-up to ensure our physicians have the information they need to help with your evaluation and healthcare recommendations.    Effective January 1st, 2014, we ask that you re-schedule your appointment with our physicians should you arrive 10 or more minutes late for your appointment.  We strive to give you quality time with our providers, and arriving late affects you and other patients whose appointments are after yours.    Again, thank you for choosing [facility].  Our hope is that these requests will decrease the amount of time that you wait before being seen by our physicians.       _____________________________________________________________  Should you have questions after your visit to [facility], please contact our office at [phone] between the hours of 8:30 a.m. and 5:00 p.m.  Voicemails left after 4:30 p.m. will not be returned until the following business day.  For prescription refill requests, have your pharmacy contact our office with your  prescription refill request.

## 2012-09-21 NOTE — Progress Notes (Signed)
Iv started with #22 angiocath to right wrist area.  Good blood return.  Fluarix 5 ml given IM Z-track to right deltoid muscle.  Tolerated all well.

## 2012-10-08 ENCOUNTER — Ambulatory Visit (INDEPENDENT_AMBULATORY_CARE_PROVIDER_SITE_OTHER): Payer: Medicare Other | Admitting: Internal Medicine

## 2012-10-08 ENCOUNTER — Encounter (INDEPENDENT_AMBULATORY_CARE_PROVIDER_SITE_OTHER): Payer: Self-pay | Admitting: Internal Medicine

## 2012-10-08 VITALS — BP 122/70 | HR 78 | Temp 98.0°F | Resp 18 | Ht 66.0 in | Wt 152.5 lb

## 2012-10-08 DIAGNOSIS — K219 Gastro-esophageal reflux disease without esophagitis: Secondary | ICD-10-CM

## 2012-10-08 DIAGNOSIS — D649 Anemia, unspecified: Secondary | ICD-10-CM

## 2012-10-08 DIAGNOSIS — K754 Autoimmune hepatitis: Secondary | ICD-10-CM

## 2012-10-08 MED ORDER — DEXLANSOPRAZOLE 60 MG PO CPDR: 60.0000 mg | DELAYED_RELEASE_CAPSULE | ORAL | Status: DC

## 2012-10-08 NOTE — Progress Notes (Signed)
Presenting complaint;  Followup for autoimmune hepatitis.  Subjective:  Patient is 79 year old Afro-American female who presents for scheduled visit accompanied by her daughter [name]. She was last seen 4 months ago. She was seen at oncology clinic and noted to have iron deficiency anemia and received iron infusion 6 weeks and 2 weeks ago. She is scheduled to have blood work later this week. She feels fine. She denies shortness of breath heartburn nausea vomiting pruritus or abdominal pain. She has formed stools usually once a day. She denies melena or rectal bleeding. She complains of feeling nervous and is wondering if prednisone dose could be reduced. Her daughter would like for magnesium level to be checked since this has not been done in several months.  Current Medications: Current Outpatient Prescriptions  Medication Sig Dispense Refill  . acetaminophen (TYLENOL) 325 MG tablet Take 650 mg by mouth every 6 (six) hours as needed. For pain      . azaTHIOprine (IMURAN) 50 MG tablet Take 1.5 tablets (75 mg total) by mouth daily.  45 tablet  5  . Cholecalciferol (VITAMIN D) 2000 UNITS tablet Take 2,000 Units by mouth daily.      . Coenzyme Q10 (CO Q 10) 100 MG CAPS Take 200 mg by mouth daily.      Marland Kitchen dexlansoprazole (DEXILANT) 60 MG capsule Take 1 capsule (60 mg total) by mouth daily.  90 capsule  3  . diclofenac sodium (VOLTAREN) 1 % GEL Apply 1 application topically daily as needed (for pain). Apply to back and legs      . fish oil-omega-3 fatty acids 1000 MG capsule Take 1 g by mouth daily.      . folic acid (FOLVITE) 800 MCG tablet Take 800 mcg by mouth.       . furosemide (LASIX) 20 MG tablet Take 20 mg by mouth daily as needed. For fluid retention      . magnesium oxide (MAG-OX) 400 MG tablet Take 400 mg by mouth daily.      . metoprolol tartrate (LOPRESSOR) 25 MG tablet Take 12.5 mg by mouth 2 (two) times daily.      . Multiple Vitamins-Minerals (MULTIVITAMIN WITH MINERALS) tablet Take 1  tablet by mouth daily.        . predniSONE (DELTASONE) 10 MG tablet Take 1 tablet (10 mg total) by mouth daily.  90 tablet  2  . spironolactone (ALDACTONE) 50 MG tablet Take 50 mg by mouth daily.       . Travoprost, BAK Free, (TRAVATAN) 0.004 % SOLN ophthalmic solution Place 1 drop into both eyes at bedtime.      . ursodiol (ACTIGALL) 250 MG tablet Take 2 tablets (500 mg total) by mouth 2 (two) times daily.  120 tablet  11  . vitamin B-12 (CYANOCOBALAMIN) 1000 MCG tablet Take 1,000 mcg by mouth daily.       No current facility-administered medications for this visit.   Facility-Administered Medications Ordered in Other Visits  Medication Dose Route Frequency Provider Last Rate Last Dose  . darbepoetin alfa-polysorbate (ARANESP) injection 500 mcg  500 mcg Subcutaneous Q21 days Randall An, MD   500 mcg at 11/25/11 1527  . diphenhydrAMINE (BENADRYL) capsule 25 mg  25 mg Oral Q6H PRN Randall An, MD   25 mg at 05/13/11 1610     Objective: Blood pressure 122/70, pulse 78, temperature 98 F (36.7 C), temperature source Oral, resp. rate 18, height 5\' 6"  (1.676 m), weight 152 lb 8 oz (69.174 kg).  Patient is alert and in no acute distress. She has somewhat grown bases. Conjunctiva is somewhat pale. Sclera is nonicteric Oropharyngeal mucosa is normal. No neck masses or thyromegaly noted. Cardiac exam with regular rhythm normal S1 and S2. No murmur or gallop noted. Grade 3/6 SEM best heard at LLSB. Lungs are clear to auscultation. Abdomen is soft and symmetrical without tenderness organomegaly or masses.  She has trace edema around ankles. No clubbing or koilonychia noted.  Labs/studies Results: CBC from 09/19/2012. WBC 3.5 ANC 1600 H&H 8.9 and 28.0 Platelet count 126K      Assessment:  #1. Autoimmune hepatitis. She is currently on azathioprine and prednisone. ALT has been normal for one year but AST remains mildly elevated which is most likely from nonhepatic source and may  be due to red cell hemolysis. If transaminases are normal will consider dropping prednisone dose to 7.5 mg daily. #2. GERD. Symptoms well-controlled with current therapy therefore will change schedule to every other day and see how she does. #3. Anemia. She was found to have iron deficiency anemia and has received 2 doses of Feraheme within the last 6 weeks. She is due for blood work later this week.    Plan:  Decrease Dexlansoprazole to 60 mg by mouth every other morning. Comprehensive chemistry panel and serum magnesium with next blood draw later this week. Office visit in 4 months.

## 2012-10-08 NOTE — Patient Instructions (Signed)
Physician will contact you with results of blood work when completed. Take Dexilant 60 mg by mouth every other day. Notify if you have to go back on daily schedule.

## 2012-10-10 ENCOUNTER — Other Ambulatory Visit (HOSPITAL_COMMUNITY): Payer: Medicare Other

## 2012-10-11 ENCOUNTER — Encounter (HOSPITAL_COMMUNITY): Payer: Medicare Other | Attending: Oncology

## 2012-10-11 DIAGNOSIS — D500 Iron deficiency anemia secondary to blood loss (chronic): Secondary | ICD-10-CM | POA: Insufficient documentation

## 2012-10-11 DIAGNOSIS — D649 Anemia, unspecified: Secondary | ICD-10-CM

## 2012-10-11 DIAGNOSIS — D509 Iron deficiency anemia, unspecified: Secondary | ICD-10-CM

## 2012-10-11 DIAGNOSIS — D61818 Other pancytopenia: Secondary | ICD-10-CM | POA: Insufficient documentation

## 2012-10-11 LAB — COMPREHENSIVE METABOLIC PANEL
ALT: 10 U/L (ref 0–35)
AST: 61 U/L — ABNORMAL HIGH (ref 0–37)
Alkaline Phosphatase: 51 U/L (ref 39–117)
Calcium: 10.3 mg/dL (ref 8.4–10.5)
Chloride: 100 mEq/L (ref 96–112)
Creat: 0.97 mg/dL (ref 0.50–1.10)

## 2012-10-11 LAB — CBC WITH DIFFERENTIAL/PLATELET
Basophils Absolute: 0 10*3/uL (ref 0.0–0.1)
Basophils Relative: 1 % (ref 0–1)
HCT: 33.9 % — ABNORMAL LOW (ref 36.0–46.0)
Lymphocytes Relative: 28 % (ref 12–46)
Lymphs Abs: 1 10*3/uL (ref 0.7–4.0)
MCHC: 32.7 g/dL (ref 30.0–36.0)
Monocytes Absolute: 0.4 10*3/uL (ref 0.1–1.0)
Neutro Abs: 2 10*3/uL (ref 1.7–7.7)
Neutrophils Relative %: 57 % (ref 43–77)
Platelets: 116 10*3/uL — ABNORMAL LOW (ref 150–400)
RDW: 23.1 % — ABNORMAL HIGH (ref 11.5–15.5)
WBC: 3.5 10*3/uL — ABNORMAL LOW (ref 4.0–10.5)

## 2012-10-11 NOTE — Progress Notes (Signed)
Labs drawn today for cbc/diff 

## 2012-10-12 ENCOUNTER — Encounter (HOSPITAL_COMMUNITY): Payer: Medicare Other

## 2012-10-12 ENCOUNTER — Encounter (HOSPITAL_BASED_OUTPATIENT_CLINIC_OR_DEPARTMENT_OTHER): Payer: Medicare Other

## 2012-10-12 ENCOUNTER — Encounter (HOSPITAL_COMMUNITY): Payer: Self-pay

## 2012-10-12 VITALS — BP 122/70 | HR 90 | Temp 98.6°F | Resp 16 | Wt 154.3 lb

## 2012-10-12 DIAGNOSIS — D731 Hypersplenism: Secondary | ICD-10-CM

## 2012-10-12 DIAGNOSIS — Q279 Congenital malformation of peripheral vascular system, unspecified: Secondary | ICD-10-CM

## 2012-10-12 DIAGNOSIS — D500 Iron deficiency anemia secondary to blood loss (chronic): Secondary | ICD-10-CM

## 2012-10-12 DIAGNOSIS — Q2730 Arteriovenous malformation, site unspecified: Secondary | ICD-10-CM | POA: Insufficient documentation

## 2012-10-12 DIAGNOSIS — I441 Atrioventricular block, second degree: Secondary | ICD-10-CM

## 2012-10-12 NOTE — Progress Notes (Signed)
  For IV Zometa.

## 2012-10-12 NOTE — Addendum Note (Signed)
Addended by: Evelena Leyden on: 10/12/2012 02:10 PM   Modules accepted: Orders

## 2012-10-12 NOTE — Addendum Note (Signed)
Addended by: Alla German A on: 10/12/2012 01:49 PM   Modules accepted: Level of Service

## 2012-10-12 NOTE — Patient Instructions (Signed)
[facility] Discharge Instructions  RECOMMENDATIONS MADE BY THE CONSULTANT AND ANY TEST RESULTS WILL BE SENT TO YOUR REFERRING PHYSICIAN.  EXAM FINDINGS BY THE PHYSICIAN TODAY AND SIGNS OR SYMPTOMS TO REPORT TO CLINIC OR PRIMARY PHYSICIAN: Exam and findings as discussed by Dr. Zigmund Daniel.  Will check some additional blood work today and will monitor labs every 6 weeks.  Report increased shortness of breath or other problems.  MEDICATIONS PRESCRIBED:  none  INSTRUCTIONS/FOLLOW-UP: Blood work every 6 weeks and follow-up in 3 months.  Thank you for choosing [facility] to provide your oncology and hematology care.  To afford each patient quality time with our providers, please arrive at least 15 minutes before your scheduled appointment time.  With your help, our goal is to use those 15 minutes to complete the necessary work-up to ensure our physicians have the information they need to help with your evaluation and healthcare recommendations.    Effective January 1st, 2014, we ask that you re-schedule your appointment with our physicians should you arrive 10 or more minutes late for your appointment.  We strive to give you quality time with our providers, and arriving late affects you and other patients whose appointments are after yours.    Again, thank you for choosing [facility].  Our hope is that these requests will decrease the amount of time that you wait before being seen by our physicians.       _____________________________________________________________  Should you have questions after your visit to [facility], please contact our office at [phone] between the hours of 8:30 a.m. and 5:00 p.m.  Voicemails left after 4:30 p.m. will not be returned until the following business day.  For prescription refill requests, have your pharmacy contact our office with your prescription refill request.

## 2012-10-12 NOTE — Progress Notes (Signed)
[facility] presented for labwork. Labs per MD order drawn via Peripheral Line 23 gauge needle inserted in left AC  Good blood return present. Procedure without incident.  Needle removed intact. Patient tolerated procedure well.

## 2012-10-12 NOTE — Addendum Note (Signed)
Addended by: Evelena Leyden on: 10/12/2012 02:14 PM   Modules accepted: Orders

## 2012-10-12 NOTE — Addendum Note (Signed)
Addended by: Evelena Leyden on: 10/12/2012 02:16 PM   Modules accepted: Orders

## 2012-10-12 NOTE — Progress Notes (Signed)
[facility] OFFICE PROGRESS NOTE  [name], MD [address] [place] [address] [place] [address]  DIAGNOSIS: Iron deficiency anemia secondary to blood loss (chronic)  Hypersplenism  AVM (congenital arteriovenous malformation)  Chief Complaint  Patient presents with  . Anemia  . Hypersplenism    CURRENT THERAPY: Intravenous Feraheme given on 09/21/2012 when ferritin was 27.  INTERVAL HISTORY: Eileen Santana 79 y.o. female returns for followup of iron deficiency due to chronic blood loss from arteriovenous malformations in the setting of autoimmune hepatitis with cirrhosis and hypersplenism. Patient to get additional iron and feels better. Appetite is good with no nausea, vomiting, lower extremity swelling or redness, abdominal distention, or pruritus. She tolerated the treatment well with no muscle aches or fever. She denies any nausea, vomiting, diarrhea, constipation, dysuria, hematuria, vaginal bleeding, PND, orthopnea, palpitations, headache, or seizures.   MEDICAL HISTORY: Past Medical History  Diagnosis Date  . H/O: GI bleed d/t NSAIDS  . Anemia     fe def anemia. Transfusion dependent.  . DJD (degenerative joint disease)   . Hypertension   . Colitis, acute hx of   . Ileitis hx of  . Pancytopenia 09/03/2010  . Sedimentation rate elevation 09/03/2010  . Autoimmune hepatitis     With leukocytoclastic vasculitis  . Heart block AV second degree March 2013  . Bronchitis   . Myopathy     Steroid-induced  . Renal calculus 08/12/2011  . Rheumatoid arthritis(714.0)   . Colon ulcer 04/2010    NSAID related.  . Gastric ulcer 04/2010    NSAID related  . Cancer   . UGI bleed 09/20/2011    Focal area of gastritis oozing of blood.  . Gastric AVM 09/20/2011  . Candida esophagitis 09/20/2011  . CHF (congestive heart failure)   . Paroxysmal atrial fibrillation   . Liver problem   . Pericarditis     2D Echo EF 65%-70%  . Chronic kidney disease     probably stage  II to III with a recent acute exacerbation  . Cholestatic hepatitis     INTERIM HISTORY: has Pancytopenia; Sedimentation rate elevation; Elevated liver enzymes; GI bleed; Microcytic anemia; Orthostatic hypotension; Bradycardia; Renal failure; Autoimmune hepatitis; HTN (hypertension), benign; Abdominal pain, acute, generalized; Nausea; Diarrhea; Hypomagnesemia; Hypokalemia; Prolonged Q-T interval on ECG; UTI (urinary tract infection); Gallstones; Thrombocytopenia; Renal calculus; Chronic back pain; Leg pain; Rheumatoid arthritis; Acute renal insufficiency; Hypoalbuminemia; Guaiac positive stools; UGI bleed; Gastric AVM; Candida esophagitis; Anemia; PAT (paroxysmal atrial tachycardia); Hypertrophic cardiomyopathy; Hypersplenism; Iron deficiency anemia secondary to blood loss (chronic); and AVM (congenital arteriovenous malformation) on her problem list.    ALLERGIES:  is allergic to iohexol; ivp dye; naprosyn; red blood cells; verapamil; vicodin; and sulfa antibiotics.  MEDICATIONS: has a current medication list which includes the following prescription(s): acetaminophen, azathioprine, vitamin d, co q 10, dexlansoprazole, diclofenac sodium, fish oil-omega-3 fatty acids, folic acid, furosemide, magnesium oxide, metoprolol tartrate, multivitamin with minerals, prednisone, spironolactone, travoprost (bak free), ursodiol, and vitamin b-12, and the following Facility-Administered Medications: darbepoetin alfa-polysorbate and diphenhydramine.  SURGICAL HISTORY:  Past Surgical History  Procedure Laterality Date  . Colonoscopy  04/2010  . Upper gastrointestinal endoscopy  04/2010  . Esophagogastroduodenoscopy  09/20/2011    Status post APC.  Marland Kitchen Esophagogastroduodenoscopy  09/20/2011    Procedure: ESOPHAGOGASTRODUODENOSCOPY (EGD);  Surgeon: Malissa Hippo, MD;  Location: [facility];  Service: Endoscopy;  Laterality: N/A;  . Cyst removal hand      Elbow    FAMILY  HISTORY: family history includes Arthritis  in her brother; COPD in her brother; Heart disease (age of onset: 81) in her mother; Heart disease (age of onset: 33) in her father; Hypertension in her brother and sister; Osteoporosis in her sister; Stroke in her mother.  SOCIAL HISTORY:  reports that she has been smoking Cigarettes.  She has been smoking about 0.00 packs per day. She has never used smokeless tobacco. She reports that she does not drink alcohol or use illicit drugs.  REVIEW OF SYSTEMS:  Other than that discussed above is noncontributory.  PHYSICAL EXAMINATION: ECOG PERFORMANCE STATUS: 1 - Symptomatic but completely ambulatory  Blood pressure 122/70, pulse 90, temperature 98.6 F (37 C), temperature source Oral, resp. rate 16, weight 154 lb 4.8 oz (69.99 kg).  GENERAL:alert, no distress and comfortable SKIN: skin color, texture, turgor are normal, no rashes or significant lesions EYES: PERLA; Conjunctiva are pink and non-injected, sclera clear OROPHARYNX:no exudate, no erythema on lips, buccal mucosa, or tongue. NECK: supple, thyroid normal size, non-tender, without nodularity. No masses CHEST: Normal AP diameter with no breast masses. LYMPH:  no palpable lymphadenopathy in the cervical, axillary or inguinal LUNGS: clear to auscultation and percussion with normal breathing effort HEART: regular rate & rhythm and no murmurs and no lower extremity edema ABDOMEN:abdomen soft, non-tender and normal bowel sounds. Spleen tip palpable. No free fluid wave or shifting dullness. MUSCULOSKELETAL:no cyanosis of digits and no clubbing. Range of motion normal.  NEURO: alert & oriented x 3 with fluent speech, no focal motor/sensory deficits. No asterixis.   LABORATORY DATA: Infusion on 10/11/2012  Component Date Value Range Status  . WBC 10/11/2012 3.5* 4.0 - 10.5 K/uL Final  . RBC 10/11/2012 3.99  3.87 - 5.11 MIL/uL Final  . Hemoglobin 10/11/2012 11.1* 12.0 - 15.0 g/dL Final  . HCT 16/10/9602 33.9* 36.0 - 46.0 % Final  . MCV  10/11/2012 85.0  78.0 - 100.0 fL Final  . MCH 10/11/2012 27.8  26.0 - 34.0 pg Final  . MCHC 10/11/2012 32.7  30.0 - 36.0 g/dL Final  . RDW 54/09/8117 23.1* 11.5 - 15.5 % Final  . Platelets 10/11/2012 116* 150 - 400 K/uL Final  . Neutrophils Relative % 10/11/2012 57  43 - 77 % Final  . Neutro Abs 10/11/2012 2.0  1.7 - 7.7 K/uL Final  . Lymphocytes Relative 10/11/2012 28  12 - 46 % Final  . Lymphs Abs 10/11/2012 1.0  0.7 - 4.0 K/uL Final  . Monocytes Relative 10/11/2012 13* 3 - 12 % Final  . Monocytes Absolute 10/11/2012 0.4  0.1 - 1.0 K/uL Final  . Eosinophils Relative 10/11/2012 1  0 - 5 % Final  . Eosinophils Absolute 10/11/2012 0.1  0.0 - 0.7 K/uL Final  . Basophils Relative 10/11/2012 1  0 - 1 % Final  . Basophils Absolute 10/11/2012 0.0  0.0 - 0.1 K/uL Final  Office Visit on 10/08/2012  Component Date Value Range Status  . Sodium 10/11/2012 136  135 - 145 mEq/L Final  . Potassium 10/11/2012 3.4* 3.5 - 5.3 mEq/L Final  . Chloride 10/11/2012 100  96 - 112 mEq/L Final  . CO2 10/11/2012 30  19 - 32 mEq/L Final  . Glucose, Bld 10/11/2012 157* 70 - 99 mg/dL Final  . BUN 14/78/2956 16  6 - 23 mg/dL Final  . Creat 21/30/8657 0.97  0.50 - 1.10 mg/dL Final  . Total Bilirubin 10/11/2012 0.8  0.3 - 1.2 mg/dL Final  . Alkaline Phosphatase 10/11/2012 51  39 -  117 U/L Final  . AST 10/11/2012 61* 0 - 37 U/L Final  . ALT 10/11/2012 10  0 - 35 U/L Final  . Total Protein 10/11/2012 6.4  6.0 - 8.3 g/dL Final  . Albumin 40/98/1191 2.9* 3.5 - 5.2 g/dL Final  . Calcium 47/82/9562 10.3  8.4 - 10.5 mg/dL Final  . Magnesium 13/08/6576 1.4* 1.5 - 2.5 mg/dL Final  Infusion on 46/96/2952  Component Date Value Range Status  . WBC 09/19/2012 3.5* 4.0 - 10.5 K/uL Final  . RBC 09/19/2012 3.59* 3.87 - 5.11 MIL/uL Final  . Hemoglobin 09/19/2012 8.9* 12.0 - 15.0 g/dL Final  . HCT 84/13/2440 28.0* 36.0 - 46.0 % Final  . MCV 09/19/2012 78.0  78.0 - 100.0 fL Final  . MCH 09/19/2012 24.8* 26.0 - 34.0 pg Final  .  MCHC 09/19/2012 31.8  30.0 - 36.0 g/dL Final  . RDW 10/30/2534 21.4* 11.5 - 15.5 % Final  . Platelets 09/19/2012 126* 150 - 400 K/uL Final  . Neutrophils Relative % 09/19/2012 47  43 - 77 % Final  . Neutro Abs 09/19/2012 1.6* 1.7 - 7.7 K/uL Final  . Lymphocytes Relative 09/19/2012 33  12 - 46 % Final  . Lymphs Abs 09/19/2012 1.2  0.7 - 4.0 K/uL Final  . Monocytes Relative 09/19/2012 15* 3 - 12 % Final  . Monocytes Absolute 09/19/2012 0.5  0.1 - 1.0 K/uL Final  . Eosinophils Relative 09/19/2012 4  0 - 5 % Final  . Eosinophils Absolute 09/19/2012 0.1  0.0 - 0.7 K/uL Final  . Basophils Relative 09/19/2012 1  0 - 1 % Final  . Basophils Absolute 09/19/2012 0.0  0.0 - 0.1 K/uL Final  . Iron 09/19/2012 25* 42 - 135 ug/dL Final  . TIBC 64/40/3474 343  250 - 470 ug/dL Final  . Saturation Ratios 09/19/2012 7* 20 - 55 % Final  . UIBC 09/19/2012 318  125 - 400 ug/dL Final   Performed at [facility]  . Ferritin 09/19/2012 27  10 - 291 ng/mL Final   Performed at [facility]    PATHOLOGY:  Urinalysis    Component Value Date/Time   COLORURINE YELLOW 08/10/2011 1200   APPEARANCEUR CLEAR 08/10/2011 1200   LABSPEC 1.010 08/10/2011 1200   PHURINE 6.0 08/10/2011 1200   GLUCOSEU NEGATIVE 08/10/2011 1200   HGBUR SMALL* 08/10/2011 1200   BILIRUBINUR SMALL* 08/10/2011 1200   KETONESUR NEGATIVE 08/10/2011 1200   PROTEINUR NEGATIVE 08/10/2011 1200   UROBILINOGEN 0.2 08/10/2011 1200   NITRITE POSITIVE* 08/10/2011 1200   LEUKOCYTESUR SMALL* 08/10/2011 1200    RADIOGRAPHIC STUDIES: No results found.  ASSESSMENT: #1. 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 cirrhosis with autoimmune hepatitis and splenomegaly with hypersplenism. #3. Second-degree heart block. #4. Chronic kidney disease, stage III.   PLAN: #1. Repeat CBC and soluble transferrin receptor in 6 weeks. #2. Followup in 3 months with CBC and physical exam along with cycle transferrin  receptor. #3. Patient was told to continue her current medications and to call should any new symptoms occur that are troublesome and persistent.   All questions were answered. The patient knows to call the clinic with any problems, questions or concerns. We can certainly see the patient much sooner if necessary.   I spent 30 minutes counseling the patient face to face. The total time spent in the appointment was 25 minutes.    Maurilio Lovely, MD 10/12/2012 1:36 PM

## 2012-10-17 ENCOUNTER — Other Ambulatory Visit (INDEPENDENT_AMBULATORY_CARE_PROVIDER_SITE_OTHER): Payer: Self-pay | Admitting: Internal Medicine

## 2012-10-20 LAB — MISCELLANEOUS TEST: Miscellaneous Test: 85236

## 2012-10-22 ENCOUNTER — Other Ambulatory Visit (HOSPITAL_COMMUNITY): Payer: Self-pay | Admitting: Oncology

## 2012-10-26 ENCOUNTER — Telehealth (HOSPITAL_COMMUNITY): Payer: Self-pay

## 2012-10-26 NOTE — Telephone Encounter (Signed)
Lab results called to patient per her request.

## 2012-10-30 ENCOUNTER — Telehealth (INDEPENDENT_AMBULATORY_CARE_PROVIDER_SITE_OTHER): Payer: Self-pay | Admitting: *Deleted

## 2012-10-30 NOTE — Telephone Encounter (Signed)
Forwarded to Dr.Rehman to address. 

## 2012-10-30 NOTE — Telephone Encounter (Signed)
Please let [name] and Dr. Karilyn Cota know she would like to take the pill form for her potassium instead of food. Her return phone number is [phone].

## 2012-10-31 ENCOUNTER — Telehealth (INDEPENDENT_AMBULATORY_CARE_PROVIDER_SITE_OTHER): Payer: Self-pay | Admitting: *Deleted

## 2012-10-31 NOTE — Telephone Encounter (Signed)
[name] call to let [name] know she did receive her medicine

## 2012-10-31 NOTE — Telephone Encounter (Signed)
Both the Imuran and the Potassium have been called in for the patient, refer to previous encounter for details.

## 2012-10-31 NOTE — Telephone Encounter (Signed)
Eileen Santana is going to be out of her Imuran on 11/02/12. The pharmacy only has 2 pills in stock and the factory doesn't have any. She was told she may need to get something else in its place. Her return phone number is [phone].

## 2012-10-31 NOTE — Telephone Encounter (Signed)
I called around to check on the Imuran. [facility] in [place] did have the medication in stock. Patient was made aware and was okay with a prescription being called in there. Per Dr.Rehman may call in the Imuran 50 mg- Patient is to take 1.5 mg (75 mg total ) by mouth daily - 1 month supply with 5 refills , this was called to [facility] .  Patient had called yesterday about the potassium, per Dr.Rehman may call in Potassium 20 mEq -patient to take 1 by mouth daily - 1 month supply with 2 additional refills - the was called per patient's request to [facility]. Patient was called and made aware, she told me that she will call me and let me know that she got the medications.

## 2012-10-31 NOTE — Telephone Encounter (Signed)
Okay to change KCl prescription Okay to call [facility] for Imuran prescription

## 2012-10-31 NOTE — Telephone Encounter (Signed)
Noted  

## 2012-11-15 ENCOUNTER — Other Ambulatory Visit (INDEPENDENT_AMBULATORY_CARE_PROVIDER_SITE_OTHER): Payer: Self-pay | Admitting: Internal Medicine

## 2012-11-23 ENCOUNTER — Encounter (HOSPITAL_COMMUNITY): Payer: Medicare Other | Attending: Oncology

## 2012-11-23 DIAGNOSIS — D500 Iron deficiency anemia secondary to blood loss (chronic): Secondary | ICD-10-CM

## 2012-11-23 DIAGNOSIS — D509 Iron deficiency anemia, unspecified: Secondary | ICD-10-CM | POA: Insufficient documentation

## 2012-11-23 LAB — CBC
Platelets: 120 10*3/uL — ABNORMAL LOW (ref 150–400)
RBC: 3.59 MIL/uL — ABNORMAL LOW (ref 3.87–5.11)
RDW: 18.8 % — ABNORMAL HIGH (ref 11.5–15.5)
WBC: 3.2 10*3/uL — ABNORMAL LOW (ref 4.0–10.5)

## 2012-11-23 NOTE — Progress Notes (Signed)
Labs drawn today for cbc,transferring recetor

## 2012-11-30 ENCOUNTER — Encounter: Payer: Self-pay | Admitting: Hematology and Oncology

## 2012-12-13 ENCOUNTER — Other Ambulatory Visit (INDEPENDENT_AMBULATORY_CARE_PROVIDER_SITE_OTHER): Payer: Self-pay | Admitting: Internal Medicine

## 2012-12-19 ENCOUNTER — Encounter (HOSPITAL_COMMUNITY): Payer: Self-pay | Admitting: *Deleted

## 2012-12-21 ENCOUNTER — Encounter (HOSPITAL_COMMUNITY): Payer: Medicare Other | Attending: Oncology

## 2012-12-21 ENCOUNTER — Other Ambulatory Visit (HOSPITAL_COMMUNITY): Payer: Self-pay | Admitting: Oncology

## 2012-12-21 DIAGNOSIS — D500 Iron deficiency anemia secondary to blood loss (chronic): Secondary | ICD-10-CM

## 2012-12-21 DIAGNOSIS — D509 Iron deficiency anemia, unspecified: Secondary | ICD-10-CM

## 2012-12-21 LAB — CBC
HCT: 27.1 % — ABNORMAL LOW (ref 36.0–46.0)
Hemoglobin: 8.8 g/dL — ABNORMAL LOW (ref 12.0–15.0)
MCH: 24.8 pg — ABNORMAL LOW (ref 26.0–34.0)
MCHC: 32.5 g/dL (ref 30.0–36.0)
Platelets: 146 10*3/uL — ABNORMAL LOW (ref 150–400)
RBC: 3.55 MIL/uL — ABNORMAL LOW (ref 3.87–5.11)
WBC: 3 10*3/uL — ABNORMAL LOW (ref 4.0–10.5)

## 2012-12-21 LAB — IRON AND TIBC
Iron: 17 ug/dL — ABNORMAL LOW (ref 42–135)
TIBC: 334 ug/dL (ref 250–470)

## 2012-12-21 NOTE — Progress Notes (Signed)
Eileen Santana presented for labwork. Labs per MD order drawn via Peripheral Line 23 gauge needle inserted in RT AC Good blood return present. Procedure without incident.  Needle removed intact. Patient tolerated procedure well.

## 2012-12-22 ENCOUNTER — Other Ambulatory Visit (HOSPITAL_COMMUNITY): Payer: Self-pay | Admitting: Hematology and Oncology

## 2012-12-24 ENCOUNTER — Telehealth (HOSPITAL_COMMUNITY): Payer: Self-pay

## 2012-12-24 NOTE — Telephone Encounter (Signed)
Patient notified and will come on 12/31/12.

## 2012-12-24 NOTE — Telephone Encounter (Signed)
Message copied by Evelena Leyden on Mon Dec 24, 2012  9:27 AM ------      Message from: Alla German A      Created: Sat Dec 22, 2012  2:15 PM       Pt requires IV Feraheme. Ordered for Dec 29.  Dr.F ------

## 2012-12-31 ENCOUNTER — Encounter (HOSPITAL_BASED_OUTPATIENT_CLINIC_OR_DEPARTMENT_OTHER): Payer: Medicare Other

## 2012-12-31 VITALS — BP 120/74 | HR 87 | Temp 98.2°F | Resp 18

## 2012-12-31 DIAGNOSIS — D509 Iron deficiency anemia, unspecified: Secondary | ICD-10-CM

## 2012-12-31 DIAGNOSIS — D500 Iron deficiency anemia secondary to blood loss (chronic): Secondary | ICD-10-CM

## 2012-12-31 MED ORDER — SODIUM CHLORIDE 0.9 % IV SOLN
1020.0000 mg | Freq: Once | INTRAVENOUS | Status: AC
  Administered 2012-12-31: 1020 mg via INTRAVENOUS
  Filled 2012-12-31: qty 34

## 2012-12-31 MED ORDER — SODIUM CHLORIDE 0.9 % IV SOLN
INTRAVENOUS | Status: DC
  Administered 2012-12-31: 14:00:00 via INTRAVENOUS

## 2012-12-31 NOTE — Progress Notes (Signed)
Received fereheme  1020 mg iv.  Tolerated well.  Iv d/c. Bandaid applied to site.

## 2013-01-04 ENCOUNTER — Encounter (HOSPITAL_COMMUNITY): Payer: Medicare Other | Attending: Oncology

## 2013-01-04 DIAGNOSIS — D509 Iron deficiency anemia, unspecified: Secondary | ICD-10-CM | POA: Insufficient documentation

## 2013-01-04 DIAGNOSIS — D500 Iron deficiency anemia secondary to blood loss (chronic): Secondary | ICD-10-CM | POA: Insufficient documentation

## 2013-01-04 DIAGNOSIS — D649 Anemia, unspecified: Secondary | ICD-10-CM | POA: Insufficient documentation

## 2013-01-04 LAB — CBC
HCT: 28.6 % — ABNORMAL LOW (ref 36.0–46.0)
Hemoglobin: 9.3 g/dL — ABNORMAL LOW (ref 12.0–15.0)
MCH: 24.6 pg — ABNORMAL LOW (ref 26.0–34.0)
MCHC: 32.5 g/dL (ref 30.0–36.0)
MCV: 75.7 fL — ABNORMAL LOW (ref 78.0–100.0)
Platelets: 141 10*3/uL — ABNORMAL LOW (ref 150–400)
RBC: 3.78 MIL/uL — ABNORMAL LOW (ref 3.87–5.11)
RDW: 19.5 % — ABNORMAL HIGH (ref 11.5–15.5)
WBC: 3 10*3/uL — ABNORMAL LOW (ref 4.0–10.5)

## 2013-01-04 LAB — FERRITIN: Ferritin: 1341 ng/mL — ABNORMAL HIGH (ref 10–291)

## 2013-01-04 NOTE — Progress Notes (Signed)
Labs drawn today for cbc,ferr 

## 2013-01-08 ENCOUNTER — Encounter (HOSPITAL_COMMUNITY): Payer: Self-pay

## 2013-01-08 ENCOUNTER — Encounter (HOSPITAL_BASED_OUTPATIENT_CLINIC_OR_DEPARTMENT_OTHER): Payer: Medicare Other

## 2013-01-08 VITALS — BP 114/66 | HR 89 | Temp 97.8°F | Resp 16 | Wt 145.3 lb

## 2013-01-08 DIAGNOSIS — N183 Chronic kidney disease, stage 3 unspecified: Secondary | ICD-10-CM

## 2013-01-08 DIAGNOSIS — D649 Anemia, unspecified: Secondary | ICD-10-CM

## 2013-01-08 DIAGNOSIS — Q279 Congenital malformation of peripheral vascular system, unspecified: Secondary | ICD-10-CM

## 2013-01-08 DIAGNOSIS — Q2730 Arteriovenous malformation, site unspecified: Secondary | ICD-10-CM

## 2013-01-08 DIAGNOSIS — D500 Iron deficiency anemia secondary to blood loss (chronic): Secondary | ICD-10-CM

## 2013-01-08 DIAGNOSIS — D731 Hypersplenism: Secondary | ICD-10-CM

## 2013-01-08 LAB — TRANSFERRIN RECEPTOR, SOLUABLE: Transferrin Receptor, Soluble: 4.14 — AB (ref 0.76–1.76)

## 2013-01-08 MED ORDER — DARBEPOETIN ALFA-POLYSORBATE 500 MCG/ML IJ SOLN
INTRAMUSCULAR | Status: AC
  Filled 2013-01-08: qty 1

## 2013-01-08 MED ORDER — DARBEPOETIN ALFA-POLYSORBATE 300 MCG/0.6ML IJ SOLN
500.0000 ug | INTRAMUSCULAR | Status: DC
  Administered 2013-01-08: 500 ug via SUBCUTANEOUS

## 2013-01-08 NOTE — Progress Notes (Signed)
Eileen Santana presents today for injection per MD orders. Aranesp 500 mcg administered SQ in right Abdomen. Administration without incident. Patient tolerated well.  

## 2013-01-08 NOTE — Addendum Note (Signed)
Addended by: Evelena Leyden on: 01/08/2013 01:35 PM   Modules accepted: Orders

## 2013-01-08 NOTE — Patient Instructions (Signed)
[facility] Discharge Instructions  RECOMMENDATIONS MADE BY THE CONSULTANT AND ANY TEST RESULTS WILL BE SENT TO YOUR REFERRING PHYSICIAN.  EXAM FINDINGS BY THE PHYSICIAN TODAY AND SIGNS OR SYMPTOMS TO REPORT TO CLINIC OR PRIMARY PHYSICIAN: Exam and findings as discussed by Dr. Zigmund Daniel.  Will give you aranesp today to help bring your hemoglobin up.  Will check blood work every 4 weeks and see you back in 8 weeks.  MEDICATIONS PRESCRIBED:  none  INSTRUCTIONS/FOLLOW-UP: Follow-up with blood work every 4 weeks and MD visit in 8 weeks.  Thank you for choosing [facility] to provide your oncology and hematology care.  To afford each patient quality time with our providers, please arrive at least 15 minutes before your scheduled appointment time.  With your help, our goal is to use those 15 minutes to complete the necessary work-up to ensure our physicians have the information they need to help with your evaluation and healthcare recommendations.    Effective January 1st, 2014, we ask that you re-schedule your appointment with our physicians should you arrive 10 or more minutes late for your appointment.  We strive to give you quality time with our providers, and arriving late affects you and other patients whose appointments are after yours.    Again, thank you for choosing [facility].  Our hope is that these requests will decrease the amount of time that you wait before being seen by our physicians.       _____________________________________________________________  Should you have questions after your visit to [facility], please contact our office at [phone] between the hours of 8:30 a.m. and 5:00 p.m.  Voicemails left after 4:30 p.m. will not be returned until the following business day.  For prescription refill requests, have your pharmacy contact our office with your prescription refill request.

## 2013-01-08 NOTE — Progress Notes (Signed)
[facility]  OFFICE PROGRESS NOTE  [name], MD [address] [place] [address] [place] [address]  DIAGNOSIS: No diagnosis found.  Chief Complaint  Patient presents with  . Anemia    Mixed due to hypersplenism plus AV malformations.    CURRENT THERAPY: Intermittent infusions of iron to maintain hemoglobin and iron stores. Last Feraheme infusion on 12/31/2012 receiving 1020 mg.  INTERVAL HISTORY: Eileen Santana 80 y.o. female returns for followup of mixed anemia with contributions from hypersplenism and chronic blood loss due to AV malformations, last Feraheme infusion on 12/31/2012. Hemoglobin 01/04/2013 was 9.3 with a ferritin of 1341. Previous ferritin on 12/21/2012 was 20 so ferritin appears to be a bone marrow iron stores. Soluble transferrin receptor done on 10/12/2012 was 4.14 consistent with iron deficiency. Over the holidays she suffered from influenza and transmitted to most members of her family. She still feels weak after having high fever, muscle aches, and cough. She denies any melena, hematochezia, hematuria, vaginal bleeding, epistaxis, or hemoptysis. She denies any chest pain, PND, orthopnea, palpitations, headache, or seizures. She has suffered with bilateral knee discomfort without swelling or redness. Family physician did order Tamiflu.  MEDICAL HISTORY: Past Medical History  Diagnosis Date  . H/O: GI bleed d/t NSAIDS  . Anemia     fe def anemia. Transfusion dependent.  . DJD (degenerative joint disease)   . Hypertension   . Colitis, acute hx of   . Ileitis hx of  . Pancytopenia 09/03/2010  . Sedimentation rate elevation 09/03/2010  . Autoimmune hepatitis     With leukocytoclastic vasculitis  . Heart block AV second degree March 2013  . Bronchitis   . Myopathy     Steroid-induced  . Renal calculus 08/12/2011  . Rheumatoid arthritis(714.0)   . Colon ulcer 04/2010    NSAID related.  . Gastric ulcer 04/2010    NSAID  related  . Cancer   . UGI bleed 09/20/2011    Focal area of gastritis oozing of blood.  . Gastric AVM 09/20/2011  . Candida esophagitis 09/20/2011  . CHF (congestive heart failure)   . Paroxysmal atrial fibrillation   . Liver problem   . Pericarditis     2D Echo EF 65%-70%  . Chronic kidney disease     probably stage II to III with a recent acute exacerbation  . Cholestatic hepatitis     INTERIM HISTORY: has Pancytopenia; Sedimentation rate elevation; Elevated liver enzymes; GI bleed; Microcytic anemia; Orthostatic hypotension; Bradycardia; Renal failure; Autoimmune hepatitis; HTN (hypertension), benign; Abdominal pain, acute, generalized; Nausea; Diarrhea; Hypomagnesemia; Hypokalemia; Prolonged Q-T interval on ECG; UTI (urinary tract infection); Gallstones; Thrombocytopenia; Renal calculus; Chronic back pain; Leg pain; Rheumatoid arthritis; Acute renal insufficiency; Hypoalbuminemia; Guaiac positive stools; UGI bleed; Gastric AVM; Candida esophagitis; Anemia; PAT (paroxysmal atrial tachycardia); Hypertrophic cardiomyopathy; Hypersplenism; Iron deficiency anemia secondary to blood loss (chronic); and AVM (congenital arteriovenous malformation) on her problem list.    ALLERGIES:  is allergic to iohexol; ivp dye; naprosyn; red blood cells; verapamil; vicodin; and sulfa antibiotics.  MEDICATIONS: has a current medication list which includes the following prescription(s): acetaminophen, azathioprine, vitamin d, co q 10, dexlansoprazole, diclofenac sodium, fish oil-omega-3 fatty acids, folic acid, furosemide, magnesium oxide, metoprolol tartrate, multivitamin with minerals, potassium chloride sa, prednisone, prednisone, ferumoxytol, spironolactone, travoprost (bak free), ursodiol, and vitamin b-12, and the following Facility-Administered Medications: darbepoetin alfa-polysorbate and diphenhydramine.  SURGICAL HISTORY:  Past Surgical History  Procedure Laterality Date  .  Colonoscopy  04/2010  . Upper  gastrointestinal endoscopy  04/2010  . Esophagogastroduodenoscopy  09/20/2011    Status post APC.  Marland Kitchen Esophagogastroduodenoscopy  09/20/2011    Procedure: ESOPHAGOGASTRODUODENOSCOPY (EGD);  Surgeon: Malissa Hippo, MD;  Location: [facility];  Service: Endoscopy;  Laterality: N/A;  . Cyst removal hand      Elbow    FAMILY HISTORY: family history includes Arthritis in her brother; COPD in her brother; Heart disease (age of onset: 71) in her mother; Heart disease (age of onset: 40) in her father; Hypertension in her brother and sister; Osteoporosis in her sister; Stroke in her mother.  SOCIAL HISTORY:  reports that she has been smoking Cigarettes.  She has been smoking about 0.00 packs per day. She has never used smokeless tobacco. She reports that she does not drink alcohol or use illicit drugs.  REVIEW OF SYSTEMS:  Other than that discussed above is noncontributory.  PHYSICAL EXAMINATION: ECOG PERFORMANCE STATUS: 1 - Symptomatic but completely ambulatory  There were no vitals taken for this visit.  GENERAL:alert, no distress and comfortable SKIN: skin color, texture, turgor are normal, no rashes or significant lesions EYES: PERLA; Conjunctiva are pink and non-injected, sclera clear OROPHARYNX:no exudate, no erythema on lips, buccal mucosa, or tongue. NECK: supple, thyroid normal size, non-tender, without nodularity. No masses CHEST: Normal AP diameter with no breast masses. LYMPH:  no palpable lymphadenopathy in the cervical, axillary or inguinal LUNGS: clear to auscultation and percussion with normal breathing effort HEART: regular rate & rhythm and no murmurs. ABDOMEN:abdomen soft, non-tender and normal bowel sounds MUSCULOSKELETAL:no cyanosis of digits and no clubbing. Range of motion normal.  NEURO: alert & oriented x 3 with fluent speech, no focal motor/sensory deficits   LABORATORY DATA: Infusion on 01/04/2013  Component Date Value Range Status  . Ferritin 01/04/2013 1341*  10 - 291 ng/mL Final   Performed at [facility]  . WBC 01/04/2013 3.0* 4.0 - 10.5 K/uL Final  . RBC 01/04/2013 3.78* 3.87 - 5.11 MIL/uL Final  . Hemoglobin 01/04/2013 9.3* 12.0 - 15.0 g/dL Final  . HCT 62/94/7654 28.6* 36.0 - 46.0 % Final  . MCV 01/04/2013 75.7* 78.0 - 100.0 fL Final  . MCH 01/04/2013 24.6* 26.0 - 34.0 pg Final  . MCHC 01/04/2013 32.5  30.0 - 36.0 g/dL Final  . RDW 65/03/5463 19.5* 11.5 - 15.5 % Final  . Platelets 01/04/2013 141* 150 - 400 K/uL Final   CONSISTENT WITH PREVIOUS RESULT  Infusion on 12/21/2012  Component Date Value Range Status  . WBC 12/21/2012 3.0* 4.0 - 10.5 K/uL Final  . RBC 12/21/2012 3.55* 3.87 - 5.11 MIL/uL Final  . Hemoglobin 12/21/2012 8.8* 12.0 - 15.0 g/dL Final  . HCT 68/12/7515 27.1* 36.0 - 46.0 % Final  . MCV 12/21/2012 76.3* 78.0 - 100.0 fL Final  . MCH 12/21/2012 24.8* 26.0 - 34.0 pg Final  . MCHC 12/21/2012 32.5  30.0 - 36.0 g/dL Final  . RDW 00/17/4944 18.7* 11.5 - 15.5 % Final  . Platelets 12/21/2012 146* 150 - 400 K/uL Final  Orders Only on 12/21/2012  Component Date Value Range Status  . Ferritin 12/21/2012 20  10 - 291 ng/mL Final   Performed at [facility]  . Iron 12/21/2012 17* 42 - 135 ug/dL Final  . TIBC 96/75/9163 334  250 - 470 ug/dL Final  . Saturation Ratios 12/21/2012 5* 20 - 55 % Final  . UIBC 12/21/2012 317  125 - 400 ug/dL Final  Performed at [facility]    PATHOLOGY: No new pathology.   Urinalysis    Component Value Date/Time   COLORURINE YELLOW 08/10/2011 1200   APPEARANCEUR CLEAR 08/10/2011 1200   LABSPEC 1.010 08/10/2011 1200   PHURINE 6.0 08/10/2011 1200   GLUCOSEU NEGATIVE 08/10/2011 1200   HGBUR SMALL* 08/10/2011 1200   BILIRUBINUR SMALL* 08/10/2011 1200   KETONESUR NEGATIVE 08/10/2011 1200   PROTEINUR NEGATIVE 08/10/2011 1200   UROBILINOGEN 0.2 08/10/2011 1200   NITRITE POSITIVE* 08/10/2011 1200   LEUKOCYTESUR SMALL* 08/10/2011 1200    RADIOGRAPHIC STUDIES: No results  found.  ASSESSMENT:  #1. Mixed anemia secondary to hypersplenism plus chronic blood loss from AV malformations, status post iron infusion with normalization of ferritin, for Aranesp today 500 mcg subcutaneously. #2. Cirrhosis with hypersplenism. #3. Chronic kidney disease, stage III. #4. Second-degree heart block.   PLAN:  #1. Aranesp 500 mcg subcutaneously today. #2. CBC and ferritin in 4 weeks. #3. Office visit with CBC and ferritin in 8 weeks. She was told to call should she develop any melena or hematochezia.   All questions were answered. The patient knows to call the clinic with any problems, questions or concerns. We can certainly see the patient much sooner if necessary.   I spent 25 minutes counseling the patient face to face. The total time spent in the appointment was 30 minutes.    Maurilio Lovely, MD 01/08/2013 12:47 PM

## 2013-01-17 LAB — MISCELLANEOUS TEST: Miscellaneous Test: 85236

## 2013-01-23 ENCOUNTER — Telehealth (INDEPENDENT_AMBULATORY_CARE_PROVIDER_SITE_OTHER): Payer: Self-pay | Admitting: *Deleted

## 2013-01-23 NOTE — Telephone Encounter (Signed)
Our office rec'd a note from [facility] , stating that the Ursodiol 300 mg  Is currently on manufacturer back order with no given release date. Please advise on an alternative, so that the patient may continue therapy without interruption.  A month or so ago we were able to obtain this medication from [facility] in [place]. I have called and left a message for the patient asking that she call me ,so that  I may help her with this.

## 2013-01-24 ENCOUNTER — Telehealth (INDEPENDENT_AMBULATORY_CARE_PROVIDER_SITE_OTHER): Payer: Self-pay | Admitting: *Deleted

## 2013-01-24 NOTE — Telephone Encounter (Signed)
Patient has currently been on Ursodiol 250 mg  Taking 2 tablets my mouth twice daily. We rec'd from [facility] that this medication was on a back order. The 300 mg is available.  Per Dr.Rehman the patient may take 300 mg by mouth Three times a day #90 with 11 refills I attempted to call this to the above Pharmacy but was told that it would be best to call retail pharmacy as they may not have it and if they did , if would be after 5 o'clock tomorrow before she would get it. Patient is out of the medication.  Patient contacted and she agrees that I may call the prescription to [facility] in [place]. Called and given to [employer]. Will be delivered in the morning and patient is aware.

## 2013-01-31 ENCOUNTER — Other Ambulatory Visit (HOSPITAL_COMMUNITY): Payer: Self-pay

## 2013-01-31 DIAGNOSIS — D61818 Other pancytopenia: Secondary | ICD-10-CM

## 2013-01-31 DIAGNOSIS — D649 Anemia, unspecified: Secondary | ICD-10-CM

## 2013-02-01 ENCOUNTER — Encounter (HOSPITAL_COMMUNITY): Payer: Medicare Other

## 2013-02-04 ENCOUNTER — Encounter (HOSPITAL_COMMUNITY): Payer: Medicare Other | Attending: Oncology

## 2013-02-04 DIAGNOSIS — D649 Anemia, unspecified: Secondary | ICD-10-CM | POA: Insufficient documentation

## 2013-02-04 DIAGNOSIS — D61818 Other pancytopenia: Secondary | ICD-10-CM | POA: Insufficient documentation

## 2013-02-04 LAB — CBC
HCT: 32.5 % — ABNORMAL LOW (ref 36.0–46.0)
HEMOGLOBIN: 10.8 g/dL — AB (ref 12.0–15.0)
MCH: 27.6 pg (ref 26.0–34.0)
MCHC: 33.2 g/dL (ref 30.0–36.0)
MCV: 82.9 fL (ref 78.0–100.0)
PLATELETS: 78 10*3/uL — AB (ref 150–400)
RBC: 3.92 MIL/uL (ref 3.87–5.11)
RDW: 23.1 % — ABNORMAL HIGH (ref 11.5–15.5)
WBC: 2.3 10*3/uL — AB (ref 4.0–10.5)

## 2013-02-04 LAB — FERRITIN: Ferritin: 347 ng/mL — ABNORMAL HIGH (ref 10–291)

## 2013-02-04 NOTE — Progress Notes (Signed)
Labs drawn today for cbc,ferr 

## 2013-02-11 ENCOUNTER — Encounter (INDEPENDENT_AMBULATORY_CARE_PROVIDER_SITE_OTHER): Payer: Self-pay | Admitting: Internal Medicine

## 2013-02-11 ENCOUNTER — Ambulatory Visit (INDEPENDENT_AMBULATORY_CARE_PROVIDER_SITE_OTHER): Payer: Medicare Other | Admitting: Internal Medicine

## 2013-02-11 ENCOUNTER — Other Ambulatory Visit (HOSPITAL_COMMUNITY): Payer: Medicare Other

## 2013-02-11 VITALS — BP 138/70 | HR 76 | Temp 97.8°F | Resp 18 | Ht 66.0 in | Wt 146.1 lb

## 2013-02-11 DIAGNOSIS — K754 Autoimmune hepatitis: Secondary | ICD-10-CM

## 2013-02-11 DIAGNOSIS — K219 Gastro-esophageal reflux disease without esophagitis: Secondary | ICD-10-CM

## 2013-02-11 DIAGNOSIS — D696 Thrombocytopenia, unspecified: Secondary | ICD-10-CM

## 2013-02-11 LAB — HEPATIC FUNCTION PANEL
ALK PHOS: 56 U/L (ref 39–117)
ALT: 9 U/L (ref 0–35)
AST: 55 U/L — ABNORMAL HIGH (ref 0–37)
Albumin: 3.1 g/dL — ABNORMAL LOW (ref 3.5–5.2)
BILIRUBIN TOTAL: 1.1 mg/dL (ref 0.2–1.2)
Bilirubin, Direct: 0.4 mg/dL — ABNORMAL HIGH (ref 0.0–0.3)
Indirect Bilirubin: 0.7 mg/dL (ref 0.2–1.2)
TOTAL PROTEIN: 6.5 g/dL (ref 6.0–8.3)

## 2013-02-11 LAB — CBC WITH DIFFERENTIAL/PLATELET
Basophils Absolute: 0 10*3/uL (ref 0.0–0.1)
Basophils Relative: 1 % (ref 0–1)
EOS PCT: 2 % (ref 0–5)
Eosinophils Absolute: 0.1 10*3/uL (ref 0.0–0.7)
HEMATOCRIT: 30.8 % — AB (ref 36.0–46.0)
Hemoglobin: 10.2 g/dL — ABNORMAL LOW (ref 12.0–15.0)
LYMPHS ABS: 0.6 10*3/uL — AB (ref 0.7–4.0)
LYMPHS PCT: 18 % (ref 12–46)
MCH: 27.3 pg (ref 26.0–34.0)
MCHC: 33.1 g/dL (ref 30.0–36.0)
MCV: 82.4 fL (ref 78.0–100.0)
Monocytes Absolute: 0.5 10*3/uL (ref 0.1–1.0)
Monocytes Relative: 14 % — ABNORMAL HIGH (ref 3–12)
Neutro Abs: 2 10*3/uL (ref 1.7–7.7)
Neutrophils Relative %: 65 % (ref 43–77)
PLATELETS: 94 10*3/uL — AB (ref 150–400)
RBC: 3.74 MIL/uL — AB (ref 3.87–5.11)
RDW: 24.3 % — ABNORMAL HIGH (ref 11.5–15.5)
WBC: 3.1 10*3/uL — ABNORMAL LOW (ref 4.0–10.5)

## 2013-02-11 LAB — BASIC METABOLIC PANEL
BUN: 22 mg/dL (ref 6–23)
CO2: 30 meq/L (ref 19–32)
CREATININE: 0.96 mg/dL (ref 0.50–1.10)
Calcium: 10.5 mg/dL (ref 8.4–10.5)
Chloride: 100 mEq/L (ref 96–112)
GLUCOSE: 103 mg/dL — AB (ref 70–99)
Potassium: 4.7 mEq/L (ref 3.5–5.3)
Sodium: 136 mEq/L (ref 135–145)

## 2013-02-11 LAB — MAGNESIUM: Magnesium: 1.4 mg/dL — ABNORMAL LOW (ref 1.5–2.5)

## 2013-02-11 NOTE — Patient Instructions (Signed)
Physician will contact you with results of blood work and further recommendations. 

## 2013-02-11 NOTE — Progress Notes (Signed)
Presenting complaint;  Followup for autoimmune hepatitis and GERD. History of iron deficiency anemia.  Subjective:  Patient is [name] who presents for scheduled visit accompanied by her daughter [name]. She was last seen 4 months ago. She states she received iron infusion 6 weeks ago. He had flu around Christmas and was sick for 10-12 days. Her daughter believes she lost 8-10 pounds and she has gained a couple of pounds back. Her appetite has improved but is not normal. She does not have heartburn often. She believes she is doing well with Dexon and every other day. Bowels move daily. She denies melena rectal bleeding or abdominal pain. Every now and then she has pins and needles across her abdomen but this is very transient symptom. Patient's daughter would like for a potassium to be checked since she is on Spironolactone in addition to KCL. She received aranesp on 01/08/2013   Current Medications: Current Outpatient Prescriptions  Medication Sig Dispense Refill  . acetaminophen (TYLENOL) 325 MG tablet Take 650 mg by mouth every 6 (six) hours as needed. For pain      . azaTHIOprine (IMURAN) 50 MG tablet Take 1.5 tablets (75 mg total) by mouth daily.  45 tablet  5  . Cholecalciferol (VITAMIN D) 2000 UNITS tablet Take 2,000 Units by mouth daily.      . Coenzyme Q10 (CO Q 10) 100 MG CAPS Take 200 mg by mouth daily.      Marland Kitchen dexlansoprazole (DEXILANT) 60 MG capsule Take 1 capsule (60 mg total) by mouth every other day.  45 capsule  3  . diclofenac sodium (VOLTAREN) 1 % GEL Apply 1 application topically daily as needed (for pain). Apply to back and legs      . fish oil-omega-3 fatty acids 1000 MG capsule Take 1 g by mouth daily.      . folic acid (FOLVITE) 800 MCG tablet Take 800 mcg by mouth.       . magnesium oxide (MAG-OX) 400 MG tablet Take 400 mg by mouth daily.      . metoprolol tartrate (LOPRESSOR) 25 MG tablet Take 12.5 mg by mouth 2 (two) times daily.      .  Multiple Vitamins-Minerals (MULTIVITAMIN WITH MINERALS) tablet Take 1 tablet by mouth daily.        . potassium chloride SA (K-DUR,KLOR-CON) 20 MEQ tablet TAKE 1 TABLET BY MOUTH ONCE DAILY  30 tablet  2  . predniSONE (DELTASONE) 5 MG tablet TAKE 1&1/2 TABLET BY MOUTH EVERY MORNING  45 tablet  5  . sodium chloride 0.9 % SOLN 100 mL with ferumoxytol 510 MG/17ML SOLN 1,020 mg Inject 1,020 mg into the vein once.      Marland Kitchen spironolactone (ALDACTONE) 50 MG tablet Take 50 mg by mouth daily.       . Travoprost, BAK Free, (TRAVATAN) 0.004 % SOLN ophthalmic solution Place 1 drop into both eyes at bedtime.      . ursodiol (ACTIGALL) 300 MG capsule Take 300 mg by mouth 2 (two) times daily.       . vitamin B-12 (CYANOCOBALAMIN) 1000 MCG tablet Take 1,000 mcg by mouth daily.      . furosemide (LASIX) 20 MG tablet Take 20 mg by mouth daily as needed. For fluid retention       No current facility-administered medications for this visit.   Facility-Administered Medications Ordered in Other Visits  Medication Dose Route Frequency Provider Last Rate Last Dose  . darbepoetin (ARANESP) injection 500 mcg  500 mcg Subcutaneous Q21 days Alla German, MD   500 mcg at 01/08/13 1332  . darbepoetin alfa-polysorbate (ARANESP) injection 500 mcg  500 mcg Subcutaneous Q21 days Randall An, MD   500 mcg at 11/25/11 1527  . diphenhydrAMINE (BENADRYL) capsule 25 mg  25 mg Oral Q6H PRN Randall An, MD   25 mg at 05/13/11 0962     Objective: Blood pressure 138/70, pulse 76, temperature 97.8 F (36.6 C), temperature source Oral, resp. rate 18, height 5\' 6"  (1.676 m), weight 146 lb 1.6 oz (66.271 kg). Patient is alert and in no acute distress. Conjunctiva is pink. Sclera is nonicteric She has strabismus with lateral gaze in left eye. Oropharyngeal mucosa is normal. No neck masses or thyromegaly noted. Cardiac exam with regular rhythm normal S1 and S2. No murmur or gallop noted. Lungs are clear to  auscultation. Abdomen is soft and nontender without organomegaly or masses.  No LE edema or clubbing noted.  Labs/studies Results: Lab data from 02/04/2013. WBC 2.3 differential not available. H&H 10.8 and 32.5 and platelet count 78K. Serum ferritin 347; it was 1341 on 01/04/2013 and 20 on 12/21/2012. Serum iron and TIBC 17 and 334 respectively with saturation of 5% on 12/21/2012.   Assessment:  #1. Autoimmune hepatitis. She appears to be in remission. She is due for LFTs. Patient is on azathioprine and Urso. #2. Pancytopenia. Her platelet count is almost half of what was 5 weeks ago and similarly her WBC is low. Need to make sure that leukopenia and thrombocytopenia is not secondary to immunosuppressive therapy. If ANC is low azathioprine dose may have to be reduced. #3. History of iron deficiency anemia. Hemoglobin is 10.8 which is the best it has been for several months. Last dose of feraheme wasn't 12/21/2012 #4. History of hypokalemia. Patient is in oral KCL and spironolactone. Therefore serum potassium needs to be checked to make sure she is not hyperkalemic. #5. GERD. She is doing well with every other day PPI. #6. History of hypomagnesemia.  Plan:  Patient will go to the lab for CBC with differential, LFTs, metabolic 7 and serum magnesium level. Office visit in 4 months.

## 2013-02-18 ENCOUNTER — Telehealth (INDEPENDENT_AMBULATORY_CARE_PROVIDER_SITE_OTHER): Payer: Self-pay | Admitting: *Deleted

## 2013-02-18 DIAGNOSIS — D696 Thrombocytopenia, unspecified: Secondary | ICD-10-CM

## 2013-02-18 NOTE — Telephone Encounter (Signed)
Per Dr.Rehman the patient will need to have labs drawn in 1 month. 

## 2013-02-26 ENCOUNTER — Telehealth (INDEPENDENT_AMBULATORY_CARE_PROVIDER_SITE_OTHER): Payer: Self-pay | Admitting: *Deleted

## 2013-02-26 NOTE — Telephone Encounter (Signed)
Shamone said to let Dr. Karilyn Cota know she is going to have lab work drawn Monday, 0302/15, at the [facility]. She though he was going to add some additional lab work for him. Her return phone number is [phone].

## 2013-02-27 ENCOUNTER — Telehealth (INDEPENDENT_AMBULATORY_CARE_PROVIDER_SITE_OTHER): Payer: Self-pay | Admitting: *Deleted

## 2013-02-27 DIAGNOSIS — D509 Iron deficiency anemia, unspecified: Secondary | ICD-10-CM

## 2013-02-27 DIAGNOSIS — K754 Autoimmune hepatitis: Secondary | ICD-10-CM

## 2013-02-27 DIAGNOSIS — D696 Thrombocytopenia, unspecified: Secondary | ICD-10-CM

## 2013-02-27 NOTE — Telephone Encounter (Signed)
At [facility] 02/18/13, Dr.Rehman ordered labs and the patient is to have those drawn when she has her lab work drawn at the [facility].

## 2013-02-27 NOTE — Telephone Encounter (Signed)
Patient was called and made aware that her orders were in Epic . Cancer Center made aware also.

## 2013-03-01 ENCOUNTER — Encounter (HOSPITAL_COMMUNITY): Payer: Medicare Other

## 2013-03-04 ENCOUNTER — Encounter (HOSPITAL_COMMUNITY): Payer: Medicare Other | Attending: Oncology

## 2013-03-04 DIAGNOSIS — D61818 Other pancytopenia: Secondary | ICD-10-CM | POA: Insufficient documentation

## 2013-03-04 DIAGNOSIS — K754 Autoimmune hepatitis: Secondary | ICD-10-CM | POA: Insufficient documentation

## 2013-03-04 DIAGNOSIS — D649 Anemia, unspecified: Secondary | ICD-10-CM

## 2013-03-04 DIAGNOSIS — D696 Thrombocytopenia, unspecified: Secondary | ICD-10-CM | POA: Insufficient documentation

## 2013-03-04 DIAGNOSIS — D509 Iron deficiency anemia, unspecified: Secondary | ICD-10-CM | POA: Insufficient documentation

## 2013-03-04 LAB — BASIC METABOLIC PANEL
BUN: 14 mg/dL (ref 6–23)
CHLORIDE: 100 meq/L (ref 96–112)
CO2: 31 mEq/L (ref 19–32)
Calcium: 10.6 mg/dL — ABNORMAL HIGH (ref 8.4–10.5)
Creatinine, Ser: 0.99 mg/dL (ref 0.50–1.10)
GFR calc Af Amer: 61 mL/min — ABNORMAL LOW (ref >90)
GFR, EST NON AFRICAN AMERICAN: 52 mL/min — AB (ref >90)
GLUCOSE: 146 mg/dL — AB (ref 70–99)
POTASSIUM: 4.1 meq/L (ref 3.7–5.3)
Sodium: 138 mEq/L (ref 137–147)

## 2013-03-04 LAB — HEPATIC FUNCTION PANEL
ALT: 9 U/L (ref 0–35)
AST: 58 U/L — AB (ref 0–37)
Albumin: 2.5 g/dL — ABNORMAL LOW (ref 3.5–5.2)
Alkaline Phosphatase: 62 U/L (ref 39–117)
BILIRUBIN DIRECT: 0.3 mg/dL (ref 0.0–0.3)
BILIRUBIN TOTAL: 0.8 mg/dL (ref 0.3–1.2)
Indirect Bilirubin: 0.5 mg/dL (ref 0.3–0.9)
Total Protein: 7 g/dL (ref 6.0–8.3)

## 2013-03-04 LAB — CBC
HEMATOCRIT: 28 % — AB (ref 36.0–46.0)
Hemoglobin: 9.5 g/dL — ABNORMAL LOW (ref 12.0–15.0)
MCH: 28.4 pg (ref 26.0–34.0)
MCHC: 33.9 g/dL (ref 30.0–36.0)
MCV: 83.8 fL (ref 78.0–100.0)
PLATELETS: 99 10*3/uL — AB (ref 150–400)
RBC: 3.34 MIL/uL — ABNORMAL LOW (ref 3.87–5.11)
RDW: 20 % — ABNORMAL HIGH (ref 11.5–15.5)
WBC: 1.9 10*3/uL — ABNORMAL LOW (ref 4.0–10.5)

## 2013-03-04 LAB — MAGNESIUM: Magnesium: 1.7 mg/dL (ref 1.5–2.5)

## 2013-03-04 LAB — FERRITIN: Ferritin: 66 ng/mL (ref 10–291)

## 2013-03-04 NOTE — Progress Notes (Signed)
Eileen Santana presented for labwork. Labs per MD order drawn via Peripheral Line 23 gauge needle inserted in right AC.  Good blood return present. Procedure without incident.  Needle removed intact. Patient tolerated procedure well.

## 2013-03-05 ENCOUNTER — Telehealth (HOSPITAL_COMMUNITY): Payer: Self-pay

## 2013-03-05 ENCOUNTER — Encounter (HOSPITAL_BASED_OUTPATIENT_CLINIC_OR_DEPARTMENT_OTHER): Payer: Medicare Other

## 2013-03-05 ENCOUNTER — Encounter (HOSPITAL_COMMUNITY): Payer: Self-pay

## 2013-03-05 ENCOUNTER — Other Ambulatory Visit (HOSPITAL_COMMUNITY): Payer: Self-pay | Admitting: Hematology and Oncology

## 2013-03-05 VITALS — BP 124/58 | HR 82 | Temp 98.3°F | Resp 18 | Wt 147.0 lb

## 2013-03-05 DIAGNOSIS — D500 Iron deficiency anemia secondary to blood loss (chronic): Secondary | ICD-10-CM

## 2013-03-05 DIAGNOSIS — K5521 Angiodysplasia of colon with hemorrhage: Secondary | ICD-10-CM

## 2013-03-05 DIAGNOSIS — K7460 Unspecified cirrhosis of liver: Secondary | ICD-10-CM

## 2013-03-05 DIAGNOSIS — D649 Anemia, unspecified: Secondary | ICD-10-CM

## 2013-03-05 DIAGNOSIS — I441 Atrioventricular block, second degree: Secondary | ICD-10-CM

## 2013-03-05 DIAGNOSIS — Q2730 Arteriovenous malformation, site unspecified: Secondary | ICD-10-CM

## 2013-03-05 DIAGNOSIS — D731 Hypersplenism: Secondary | ICD-10-CM

## 2013-03-05 MED ORDER — SODIUM CHLORIDE 0.9 % IV SOLN
INTRAVENOUS | Status: DC
  Administered 2013-03-05: 13:00:00 via INTRAVENOUS

## 2013-03-05 MED ORDER — SODIUM CHLORIDE 0.9 % IJ SOLN: 10.0000 mL | INTRAMUSCULAR | Status: DC | PRN

## 2013-03-05 MED ORDER — SODIUM CHLORIDE 0.9 % IV SOLN
1020.0000 mg | Freq: Once | INTRAVENOUS | Status: AC
  Administered 2013-03-05: 1020 mg via INTRAVENOUS
  Filled 2013-03-05: qty 34

## 2013-03-05 MED ORDER — SODIUM CHLORIDE 0.9 % IV SOLN: 1020.0000 mg | Freq: Once | INTRAVENOUS | Status: DC

## 2013-03-05 NOTE — Progress Notes (Signed)
[place]  OFFICE PROGRESS NOTE  [name], MD [address] [place] [address]  DIAGNOSIS: Iron deficiency anemia, unspecified  AVM (congenital arteriovenous malformation)  Hypersplenism  Chief Complaint  Patient presents with  . Hypersplenism  . Iron deficiency  . Intestinal AVMs  . Anemia chronic blood loss    CURRENT THERAPY: Intermittent iron infusion last on 12/31/2012. Aranesp subcutaneously last dose on 01/08/2013.  INTERVAL HISTORY: Eileen Santana 80 y.o. 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 She has not noticed any melena or hematochezia. She denies any vaginal bleeding, hematuria, epistaxis, or hemoptysis. Appetite is good with no nausea, vomiting, easy satiety, abdominal distention, lower extremity swelling or redness, skin rash, headache, or seizures.  MEDICAL HISTORY: Past Medical History  Diagnosis Date  . H/O: GI bleed d/t NSAIDS  . Anemia     fe def anemia. Transfusion dependent.  . DJD (degenerative joint disease)   . Hypertension   . Colitis, acute hx of   . Ileitis hx of  . Pancytopenia 09/03/2010  . Sedimentation rate elevation 09/03/2010  . Autoimmune hepatitis     With leukocytoclastic vasculitis  . Heart block AV second degree March 2013  . Bronchitis   . Myopathy     Steroid-induced  . Renal calculus 08/12/2011  . Rheumatoid arthritis(714.0)   . Colon ulcer 04/2010    NSAID related.  . Gastric ulcer 04/2010    NSAID related  . Cancer   . UGI bleed 09/20/2011    Focal area of gastritis oozing of blood.  . Gastric AVM 09/20/2011  . Candida esophagitis 09/20/2011  . CHF (congestive heart failure)   . Paroxysmal atrial fibrillation   . Liver problem   . Pericarditis     2D Echo EF 65%-70%  . Chronic kidney disease     probably  stage II to III with a recent acute exacerbation  . Cholestatic hepatitis     INTERIM HISTORY: has Pancytopenia; Sedimentation rate elevation; Elevated liver enzymes; GI bleed; Microcytic anemia; Orthostatic hypotension; Bradycardia; Renal failure; Autoimmune hepatitis; HTN (hypertension), benign; Abdominal pain, acute, generalized; Nausea; Diarrhea; Hypomagnesemia; Hypokalemia; Prolonged Q-T interval on ECG; UTI (urinary tract infection); Gallstones; Thrombocytopenia; Renal calculus; Chronic back pain; Leg pain; Rheumatoid arthritis; Acute renal insufficiency; Hypoalbuminemia; Guaiac positive stools; UGI bleed; Gastric AVM; Candida esophagitis; Anemia; PAT (paroxysmal atrial tachycardia); Hypertrophic cardiomyopathy; Hypersplenism; Iron deficiency anemia secondary to blood loss (chronic); and AVM (congenital arteriovenous malformation) on her problem list.    ALLERGIES:  is allergic to iohexol; ivp dye; naprosyn; red blood cells; verapamil; vicodin; and sulfa antibiotics.  MEDICATIONS: has a current medication list which includes the following prescription(s): acetaminophen, azathioprine, vitamin d, co q 10, dexlansoprazole, diclofenac sodium, fish oil-omega-3 fatty acids, folic acid, furosemide, magnesium oxide, metoprolol tartrate, multivitamin with minerals, potassium chloride sa, prednisone, ferumoxytol, spironolactone, travoprost (bak free), ursodiol, and vitamin b-12, and the following Facility-Administered Medications: sodium chloride, darbepoetin, darbepoetin alfa-polysorbate, diphenhydramine, ferumoxytol, and sodium chloride.  SURGICAL HISTORY:  Past Surgical History  Procedure Laterality Date  . Colonoscopy  04/2010  . Upper gastrointestinal endoscopy  04/2010  . Esophagogastroduodenoscopy  09/20/2011    Status post APC.  Marland Kitchen Esophagogastroduodenoscopy  09/20/2011    Procedure: ESOPHAGOGASTRODUODENOSCOPY (EGD);  Surgeon: Rogene Houston, MD;  Location: [facility];  Service: Endoscopy;  Laterality: N/A;  . Cyst removal hand      Elbow    FAMILY HISTORY: family history includes Arthritis in her brother; COPD in her brother; Heart disease (age of onset: 22) in her mother; Heart disease (age of onset: 9) in her father; Hypertension in her brother and sister; Osteoporosis in her sister; Stroke in her mother.  SOCIAL HISTORY:  reports that she has been smoking Cigarettes.  She has been smoking about 0.00 packs per day. She has never used smokeless tobacco. She reports that she does not drink alcohol or use illicit drugs.  REVIEW OF SYSTEMS:  Other than that discussed above is noncontributory.  PHYSICAL EXAMINATION: ECOG PERFORMANCE STATUS: 1 - Symptomatic but completely ambulatory  Blood pressure 124/58, pulse 82, temperature 98.3 F (36.8 C), temperature source Oral, resp. rate 18, weight 147 lb (66.679 kg).  GENERAL:alert, no distress and comfortable SKIN: skin color, texture, turgor are normal, no rashes or significant lesions EYES: PERLA; Conjunctiva are pink and non-injected, sclera clear OROPHARYNX:no exudate, no erythema on lips, buccal mucosa, or tongue. NECK: supple, thyroid normal size, non-tender, without nodularity. No masses CHEST: Normal AP diameter with no breast masses. LYMPH:  no palpable lymphadenopathy in the cervical, axillary or inguinal LUNGS: clear to auscultation and percussion with normal breathing effort HEART: regular rate & rhythm and no murmurs. ABDOMEN:abdomen soft, non-tender and normal bowel sounds. Spleen tip palpable. No free fluid wave or shifting dullness. MUSCULOSKELETAL:no cyanosis of digits and no clubbing. Range of motion normal.  NEURO: alert & oriented x 3 with fluent speech, no focal motor/sensory deficits   LABORATORY DATA: Infusion on 03/04/2013  Component Date Value Ref Range Status  . WBC 03/04/2013 1.9* 4.0 - 10.5 K/uL Final  . RBC 03/04/2013 3.34* 3.87 - 5.11 MIL/uL Final  . Hemoglobin 03/04/2013 9.5* 12.0 - 15.0 g/dL  Final  . HCT 03/04/2013 28.0* 36.0 - 46.0 % Final  . MCV 03/04/2013 83.8  78.0 - 100.0 fL Final  . MCH 03/04/2013 28.4  26.0 - 34.0 pg Final  . MCHC 03/04/2013 33.9  30.0 - 36.0 g/dL Final  . RDW 03/04/2013 20.0* 11.5 - 15.5 % Final  . Platelets 03/04/2013 99* 150 - 400 K/uL Final   CONSISTENT WITH PREVIOUS RESULT  . Ferritin 03/04/2013 66  10 - 291 ng/mL Final   Performed at [facility]  . Magnesium 03/04/2013 1.7  1.5 - 2.5 mg/dL Final  . Sodium 03/04/2013 138  137 - 147 mEq/L Final  . Potassium 03/04/2013 4.1  3.7 - 5.3 mEq/L Final  . Chloride 03/04/2013 100  96 - 112 mEq/L Final  . CO2 03/04/2013 31  19 - 32 mEq/L Final  . Glucose, Bld 03/04/2013 146* 70 - 99 mg/dL Final  . BUN 03/04/2013 14  6 - 23 mg/dL Final  . Creatinine, Ser 03/04/2013 0.99  0.50 - 1.10 mg/dL Final  . Calcium 03/04/2013 10.6* 8.4 - 10.5 mg/dL Final  . GFR calc non Af Amer 03/04/2013 52* >90 mL/min Final  . GFR calc Af Amer 03/04/2013 61* >90 mL/min Final   Comment: (NOTE)                          The eGFR has been calculated using the CKD EPI equation.                          This calculation has not been validated in all clinical situations.  eGFR's persistently <90 mL/min signify possible Chronic Kidney                          Disease.  . Total Protein 03/04/2013 7.0  6.0 - 8.3 g/dL Final  . Albumin 03/04/2013 2.5* 3.5 - 5.2 g/dL Final  . AST 03/04/2013 58* 0 - 37 U/L Final  . ALT 03/04/2013 9  0 - 35 U/L Final  . Alkaline Phosphatase 03/04/2013 62  39 - 117 U/L Final  . Total Bilirubin 03/04/2013 0.8  0.3 - 1.2 mg/dL Final  . Bilirubin, Direct 03/04/2013 0.3  0.0 - 0.3 mg/dL Final  . Indirect Bilirubin 03/04/2013 0.5  0.3 - 0.9 mg/dL Final  Office Visit on 02/11/2013  Component Date Value Ref Range Status  . WBC 02/11/2013 3.1* 4.0 - 10.5 K/uL Final  . RBC 02/11/2013 3.74* 3.87 - 5.11 MIL/uL Final  . Hemoglobin 02/11/2013 10.2* 12.0 - 15.0 g/dL Final  . HCT  02/11/2013 30.8* 36.0 - 46.0 % Final  . MCV 02/11/2013 82.4  78.0 - 100.0 fL Final  . MCH 02/11/2013 27.3  26.0 - 34.0 pg Final  . MCHC 02/11/2013 33.1  30.0 - 36.0 g/dL Final  . RDW 02/11/2013 24.3* 11.5 - 15.5 % Final  . Platelets 02/11/2013 94* 150 - 400 K/uL Final  . Neutrophils Relative % 02/11/2013 65  43 - 77 % Final  . Neutro Abs 02/11/2013 2.0  1.7 - 7.7 K/uL Final  . Lymphocytes Relative 02/11/2013 18  12 - 46 % Final  . Lymphs Abs 02/11/2013 0.6* 0.7 - 4.0 K/uL Final  . Monocytes Relative 02/11/2013 14* 3 - 12 % Final  . Monocytes Absolute 02/11/2013 0.5  0.1 - 1.0 K/uL Final  . Eosinophils Relative 02/11/2013 2  0 - 5 % Final  . Eosinophils Absolute 02/11/2013 0.1  0.0 - 0.7 K/uL Final  . Basophils Relative 02/11/2013 1  0 - 1 % Final  . Basophils Absolute 02/11/2013 0.0  0.0 - 0.1 K/uL Final  . Smear Review 02/11/2013 Criteria for review not met   Final  . Total Bilirubin 02/11/2013 1.1  0.2 - 1.2 mg/dL Final   ** Please note change in reference range(s). **  . Bilirubin, Direct 02/11/2013 0.4* 0.0 - 0.3 mg/dL Final  . Indirect Bilirubin 02/11/2013 0.7  0.2 - 1.2 mg/dL Final   ** Please note change in reference range(s). **  . Alkaline Phosphatase 02/11/2013 56  39 - 117 U/L Final  . AST 02/11/2013 55* 0 - 37 U/L Final  . ALT 02/11/2013 9  0 - 35 U/L Final  . Total Protein 02/11/2013 6.5  6.0 - 8.3 g/dL Final  . Albumin 02/11/2013 3.1* 3.5 - 5.2 g/dL Final  . Sodium 02/11/2013 136  135 - 145 mEq/L Final  . Potassium 02/11/2013 4.7  3.5 - 5.3 mEq/L Final  . Chloride 02/11/2013 100  96 - 112 mEq/L Final  . CO2 02/11/2013 30  19 - 32 mEq/L Final  . Glucose, Bld 02/11/2013 103* 70 - 99 mg/dL Final  . BUN 02/11/2013 22  6 - 23 mg/dL Final  . Creat 02/11/2013 0.96  0.50 - 1.10 mg/dL Final  . Calcium 02/11/2013 10.5  8.4 - 10.5 mg/dL Final  . Magnesium 02/11/2013 1.4* 1.5 - 2.5 mg/dL Final  Infusion on 02/04/2013  Component Date Value Ref Range Status  . WBC 02/04/2013  2.3* 4.0 - 10.5 K/uL Final  . RBC 02/04/2013 3.92  3.87 -  5.11 MIL/uL Final  . Hemoglobin 02/04/2013 10.8* 12.0 - 15.0 g/dL Final  . HCT 02/04/2013 32.5* 36.0 - 46.0 % Final  . MCV 02/04/2013 82.9  78.0 - 100.0 fL Final  . MCH 02/04/2013 27.6  26.0 - 34.0 pg Final  . MCHC 02/04/2013 33.2  30.0 - 36.0 g/dL Final  . RDW 02/04/2013 23.1* 11.5 - 15.5 % Final  . Platelets 02/04/2013 78* 150 - 400 K/uL Final   Comment: CONSISTENT WITH PREVIOUS RESULT                          SPECIMEN CHECKED FOR CLOTS                          PLATELET COUNT CONFIRMED BY SMEAR  . Ferritin 02/04/2013 347* 10 - 291 ng/mL Final   Performed at [place]: No new pathology.  Urinalysis    Component Value Date/Time   COLORURINE YELLOW 08/10/2011 1200   APPEARANCEUR CLEAR 08/10/2011 1200   LABSPEC 1.010 08/10/2011 1200   PHURINE 6.0 08/10/2011 1200   GLUCOSEU NEGATIVE 08/10/2011 1200   HGBUR SMALL* 08/10/2011 1200   BILIRUBINUR SMALL* 08/10/2011 1200   KETONESUR NEGATIVE 08/10/2011 1200   PROTEINUR NEGATIVE 08/10/2011 1200   UROBILINOGEN 0.2 08/10/2011 1200   NITRITE POSITIVE* 08/10/2011 1200   LEUKOCYTESUR SMALL* 08/10/2011 1200    RADIOGRAPHIC STUDIES: No results found.  ASSESSMENT:  #1. 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  #2. Cirrhosis with hypersplenism.  #3. Chronic kidney disease, stage III.  #4. Second-degree heart block.    PLAN:  #1. Feraheme 1020 mg IV today. #2. Aranesp 500 mcg subcutaneously in one week. #3. Followup in 2 months with CBC and ferritin.   All questions were answered. The patient knows to call the clinic with any problems, questions or concerns. We can certainly see the patient much sooner if necessary.   I spent 25 minutes counseling the patient face to face. The total time spent in the appointment  was 30 minutes.    Doroteo Bradford, MD 03/05/2013 1:01 PM

## 2013-03-05 NOTE — Progress Notes (Signed)
Tolerated well

## 2013-03-05 NOTE — Telephone Encounter (Signed)
Message copied by Wandra Mannan on Tue Mar 05, 2013  8:54 AM ------      Message from: Alla German A      Created: Tue Mar 05, 2013  6:47 AM      Regarding: Eileen Santana       I have ordered Feraheme for Ms.Neubauer.  Thanks. Dr.F ------

## 2013-03-05 NOTE — Patient Instructions (Signed)
.  [facility] Discharge Instructions  RECOMMENDATIONS MADE BY THE CONSULTANT AND ANY TEST RESULTS WILL BE SENT TO YOUR REFERRING PHYSICIAN.  EXAM FINDINGS BY THE PHYSICIAN TODAY AND SIGNS OR SYMPTOMS TO REPORT TO CLINIC OR PRIMARY PHYSICIAN: Exam and findings as discussed by Dr. Zigmund Daniel.  MEDICATIONS PRESCRIBED:  Iron infusion today aranesp next week  INSTRUCTIONS/FOLLOW-UP: 2 months for labwork then to see the Dr.  [name] you for choosing [facility] to provide your oncology and hematology care.  To afford each patient quality time with our providers, please arrive at least 15 minutes before your scheduled appointment time.  With your help, our goal is to use those 15 minutes to complete the necessary work-up to ensure our physicians have the information they need to help with your evaluation and healthcare recommendations.    Effective January 1st, 2014, we ask that you re-schedule your appointment with our physicians should you arrive 10 or more minutes late for your appointment.  We strive to give you quality time with our providers, and arriving late affects you and other patients whose appointments are after yours.    Again, thank you for choosing [facility].  Our hope is that these requests will decrease the amount of time that you wait before being seen by our physicians.       _____________________________________________________________  Should you have questions after your visit to [facility], please contact our office at [phone] between the hours of 8:30 a.m. and 5:00 p.m.  Voicemails left after 4:30 p.m. will not be returned until the following business day.  For prescription refill requests, have your pharmacy contact our office with your prescription refill request.

## 2013-03-07 ENCOUNTER — Other Ambulatory Visit (INDEPENDENT_AMBULATORY_CARE_PROVIDER_SITE_OTHER): Payer: Self-pay | Admitting: Internal Medicine

## 2013-03-08 ENCOUNTER — Telehealth (INDEPENDENT_AMBULATORY_CARE_PROVIDER_SITE_OTHER): Payer: Self-pay | Admitting: *Deleted

## 2013-03-08 DIAGNOSIS — D61818 Other pancytopenia: Secondary | ICD-10-CM

## 2013-03-08 NOTE — Telephone Encounter (Signed)
Per Dr.Rehman the patient will need to have labs drawn in 2 weeks. Lab is noted for 03/19/13.

## 2013-03-12 ENCOUNTER — Encounter (HOSPITAL_BASED_OUTPATIENT_CLINIC_OR_DEPARTMENT_OTHER): Payer: Medicare Other

## 2013-03-12 ENCOUNTER — Encounter (HOSPITAL_COMMUNITY): Payer: Medicare Other

## 2013-03-12 VITALS — BP 126/70 | HR 85

## 2013-03-12 DIAGNOSIS — D631 Anemia in chronic kidney disease: Secondary | ICD-10-CM

## 2013-03-12 DIAGNOSIS — N183 Chronic kidney disease, stage 3 unspecified: Secondary | ICD-10-CM

## 2013-03-12 DIAGNOSIS — N039 Chronic nephritic syndrome with unspecified morphologic changes: Secondary | ICD-10-CM

## 2013-03-12 DIAGNOSIS — D649 Anemia, unspecified: Secondary | ICD-10-CM

## 2013-03-12 MED ORDER — DARBEPOETIN ALFA-POLYSORBATE 300 MCG/0.6ML IJ SOLN
500.0000 ug | INTRAMUSCULAR | Status: DC
  Administered 2013-03-12: 500 ug via SUBCUTANEOUS
  Filled 2013-03-12: qty 1.2

## 2013-03-12 NOTE — Progress Notes (Signed)
Eileen Santana presents today for injection per MD orders. Aranesp 500 mcg administered SQ in right Abdomen. Administration without incident. Patient tolerated well.

## 2013-04-02 ENCOUNTER — Encounter (HOSPITAL_COMMUNITY): Payer: Medicare Other

## 2013-04-02 ENCOUNTER — Encounter (HOSPITAL_BASED_OUTPATIENT_CLINIC_OR_DEPARTMENT_OTHER): Payer: Medicare Other

## 2013-04-02 DIAGNOSIS — D649 Anemia, unspecified: Secondary | ICD-10-CM

## 2013-04-02 DIAGNOSIS — D61818 Other pancytopenia: Secondary | ICD-10-CM

## 2013-04-02 LAB — CBC
HEMATOCRIT: 33.8 % — AB (ref 36.0–46.0)
HEMOGLOBIN: 11.3 g/dL — AB (ref 12.0–15.0)
MCH: 29 pg (ref 26.0–34.0)
MCHC: 33.4 g/dL (ref 30.0–36.0)
MCV: 86.9 fL (ref 78.0–100.0)
PLATELETS: 78 10*3/uL — AB (ref 150–400)
RBC: 3.89 MIL/uL (ref 3.87–5.11)
RDW: 19.1 % — ABNORMAL HIGH (ref 11.5–15.5)
WBC: 1.7 10*3/uL — AB (ref 4.0–10.5)

## 2013-04-02 LAB — FERRITIN: Ferritin: 540 ng/mL — ABNORMAL HIGH (ref 10–291)

## 2013-04-02 NOTE — Progress Notes (Signed)
Hemoglobin 11.3 today, Aranesp not given, treatment parameters not met. Patient to return in 3 weeks for re-check.

## 2013-04-05 NOTE — Progress Notes (Signed)
Labs drawn via PIV stick.  

## 2013-04-19 ENCOUNTER — Other Ambulatory Visit (HOSPITAL_COMMUNITY): Payer: Self-pay

## 2013-04-19 DIAGNOSIS — D649 Anemia, unspecified: Secondary | ICD-10-CM

## 2013-04-23 ENCOUNTER — Encounter (HOSPITAL_COMMUNITY): Payer: Medicare Other | Attending: Oncology

## 2013-04-23 ENCOUNTER — Encounter (HOSPITAL_BASED_OUTPATIENT_CLINIC_OR_DEPARTMENT_OTHER): Payer: Medicare Other

## 2013-04-23 DIAGNOSIS — N183 Chronic kidney disease, stage 3 unspecified: Secondary | ICD-10-CM

## 2013-04-23 DIAGNOSIS — D649 Anemia, unspecified: Secondary | ICD-10-CM | POA: Insufficient documentation

## 2013-04-23 DIAGNOSIS — N039 Chronic nephritic syndrome with unspecified morphologic changes: Secondary | ICD-10-CM

## 2013-04-23 DIAGNOSIS — D631 Anemia in chronic kidney disease: Secondary | ICD-10-CM

## 2013-04-23 DIAGNOSIS — D61818 Other pancytopenia: Secondary | ICD-10-CM

## 2013-04-23 LAB — CBC
HEMATOCRIT: 31.8 % — AB (ref 36.0–46.0)
HEMOGLOBIN: 10.8 g/dL — AB (ref 12.0–15.0)
MCH: 29.3 pg (ref 26.0–34.0)
MCHC: 34 g/dL (ref 30.0–36.0)
MCV: 86.4 fL (ref 78.0–100.0)
Platelets: 83 10*3/uL — ABNORMAL LOW (ref 150–400)
RBC: 3.68 MIL/uL — AB (ref 3.87–5.11)
RDW: 17.9 % — ABNORMAL HIGH (ref 11.5–15.5)
WBC: 2.1 10*3/uL — ABNORMAL LOW (ref 4.0–10.5)

## 2013-04-23 LAB — FERRITIN: Ferritin: 167 ng/mL (ref 10–291)

## 2013-04-23 MED ORDER — DARBEPOETIN ALFA-POLYSORBATE 500 MCG/ML IJ SOLN
INTRAMUSCULAR | Status: AC
  Filled 2013-04-23: qty 1

## 2013-04-23 MED ORDER — DARBEPOETIN ALFA-POLYSORBATE 300 MCG/0.6ML IJ SOLN
500.0000 ug | INTRAMUSCULAR | Status: DC
  Administered 2013-04-23: 500 ug via SUBCUTANEOUS

## 2013-04-23 NOTE — Progress Notes (Signed)
Eileen Santana presents today for injection per MD orders. Aranesp 500 mcg administered SQ in right lower abdomen. Administration without incident. Patient tolerated well.

## 2013-04-23 NOTE — Progress Notes (Signed)
Labs drawn today for cbc,ferr 

## 2013-04-29 ENCOUNTER — Other Ambulatory Visit (INDEPENDENT_AMBULATORY_CARE_PROVIDER_SITE_OTHER): Payer: Self-pay | Admitting: Internal Medicine

## 2013-04-29 DIAGNOSIS — K219 Gastro-esophageal reflux disease without esophagitis: Secondary | ICD-10-CM

## 2013-04-29 NOTE — Telephone Encounter (Signed)
error 

## 2013-05-02 ENCOUNTER — Other Ambulatory Visit (INDEPENDENT_AMBULATORY_CARE_PROVIDER_SITE_OTHER): Payer: Self-pay | Admitting: Internal Medicine

## 2013-05-02 NOTE — Telephone Encounter (Signed)
Per Dr.Rehman may fill with 5 refills 

## 2013-05-07 ENCOUNTER — Other Ambulatory Visit (HOSPITAL_COMMUNITY): Payer: Self-pay

## 2013-05-09 ENCOUNTER — Other Ambulatory Visit (HOSPITAL_COMMUNITY): Payer: Medicare Other

## 2013-05-13 ENCOUNTER — Encounter (HOSPITAL_BASED_OUTPATIENT_CLINIC_OR_DEPARTMENT_OTHER): Payer: Medicare Other

## 2013-05-13 ENCOUNTER — Encounter (HOSPITAL_COMMUNITY): Payer: Self-pay

## 2013-05-13 ENCOUNTER — Encounter (HOSPITAL_COMMUNITY): Payer: Medicare Other

## 2013-05-13 ENCOUNTER — Encounter (HOSPITAL_COMMUNITY): Payer: Medicare Other | Attending: Oncology

## 2013-05-13 VITALS — BP 115/73 | HR 102 | Temp 98.2°F | Resp 18 | Wt 147.2 lb

## 2013-05-13 DIAGNOSIS — D649 Anemia, unspecified: Secondary | ICD-10-CM

## 2013-05-13 DIAGNOSIS — D500 Iron deficiency anemia secondary to blood loss (chronic): Secondary | ICD-10-CM

## 2013-05-13 DIAGNOSIS — N183 Chronic kidney disease, stage 3 unspecified: Secondary | ICD-10-CM

## 2013-05-13 DIAGNOSIS — D61818 Other pancytopenia: Secondary | ICD-10-CM

## 2013-05-13 DIAGNOSIS — N039 Chronic nephritic syndrome with unspecified morphologic changes: Secondary | ICD-10-CM

## 2013-05-13 DIAGNOSIS — K7460 Unspecified cirrhosis of liver: Secondary | ICD-10-CM

## 2013-05-13 DIAGNOSIS — D731 Hypersplenism: Secondary | ICD-10-CM

## 2013-05-13 DIAGNOSIS — D631 Anemia in chronic kidney disease: Secondary | ICD-10-CM

## 2013-05-13 LAB — CBC
HCT: 31.7 % — ABNORMAL LOW (ref 36.0–46.0)
HEMOGLOBIN: 10.8 g/dL — AB (ref 12.0–15.0)
MCH: 28.4 pg (ref 26.0–34.0)
MCHC: 34.1 g/dL (ref 30.0–36.0)
MCV: 83.4 fL (ref 78.0–100.0)
Platelets: 107 10*3/uL — ABNORMAL LOW (ref 150–400)
RBC: 3.8 MIL/uL — AB (ref 3.87–5.11)
RDW: 17.3 % — ABNORMAL HIGH (ref 11.5–15.5)
WBC: 2.6 10*3/uL — ABNORMAL LOW (ref 4.0–10.5)

## 2013-05-13 LAB — FERRITIN: Ferritin: 70 ng/mL (ref 10–291)

## 2013-05-13 MED ORDER — DARBEPOETIN ALFA-POLYSORBATE 300 MCG/0.6ML IJ SOLN
500.0000 ug | INTRAMUSCULAR | Status: DC
  Administered 2013-05-13: 500 ug via SUBCUTANEOUS
  Filled 2013-05-13: qty 1.2

## 2013-05-13 NOTE — Progress Notes (Signed)
Kimiko Common presents today for injection per MD orders. Aranesp 500 mcg administered SQ in left Abdomen. Administration without incident. Patient tolerated well.

## 2013-05-13 NOTE — Progress Notes (Signed)
[facility]  OFFICE PROGRESS NOTE  [name], MD [address] [place] [address]  DIAGNOSIS: Pancytopenia  Iron deficiency anemia secondary to blood loss (chronic) - Plan: Ferritin  Anemia - Plan: darbepoetin (ARANESP) injection 500 mcg  Chief Complaint  Patient presents with  . Anemia in chronic kidney disease  . Iron deficiency    CURRENT THERAPY: Intermittent iron infusion last on 03/05/2013 with Aranesp 500 mcg given on 04/27/2013.  INTERVAL HISTORY: Eileen Santana 80 y.o. female returns for followup of anemia of chronic disease with chronic renal failure and iron deficiency with last hemoglobin on 05/13/2013 of 9.4. 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  she continues to do well with no increase in fatigue. She denies any melena, hematochezia, epistaxis, hemoptysis, or vaginal bleeding. She denies any nausea, vomiting, diarrhea, constipation, lower extremity swelling or redness, skin rash, headache, or seizures. She did have allergies earlier in the spring but avoid going outside when the pollen count is high. hematuria,  MEDICAL HISTORY: Past Medical History  Diagnosis Date  . H/O: GI bleed d/t NSAIDS  . Anemia     fe def anemia. Transfusion dependent.  . DJD (degenerative joint disease)   . Hypertension   . Colitis, acute hx of   . Ileitis hx of  . Pancytopenia 09/03/2010  . Sedimentation rate elevation 09/03/2010  . Autoimmune hepatitis     With leukocytoclastic vasculitis  . Heart block AV second degree March 2013  . Bronchitis   . Myopathy     Steroid-induced  . Renal calculus 08/12/2011  . Rheumatoid arthritis(714.0)   . Colon ulcer 04/2010    NSAID related.  . Gastric ulcer 04/2010    NSAID related  . Cancer   . UGI bleed 09/20/2011    Focal area of gastritis oozing of  blood.  . Gastric AVM 09/20/2011  . Candida esophagitis 09/20/2011  . CHF (congestive heart failure)   . Paroxysmal atrial fibrillation   . Liver problem   . Pericarditis     2D Echo EF 65%-70%  . Chronic kidney disease     probably stage II to III with a recent acute exacerbation  . Cholestatic hepatitis     INTERIM HISTORY: has Pancytopenia; Sedimentation rate elevation; Elevated liver enzymes; GI bleed; Microcytic anemia; Orthostatic hypotension; Bradycardia; Renal failure; Autoimmune hepatitis; HTN (hypertension), benign; Abdominal pain, acute, generalized; Nausea; Diarrhea; Hypomagnesemia; Hypokalemia; Prolonged Q-T interval on ECG; UTI (urinary tract infection); Gallstones; Thrombocytopenia; Renal calculus; Chronic back pain; Leg pain; Rheumatoid arthritis; Acute renal insufficiency; Hypoalbuminemia; Guaiac positive stools; UGI bleed; Gastric AVM; Candida esophagitis; Anemia; PAT (paroxysmal atrial tachycardia); Hypertrophic cardiomyopathy; Hypersplenism; Iron deficiency anemia secondary to blood loss (chronic); and AVM (congenital arteriovenous malformation) on her problem list.    ALLERGIES:  is allergic to iohexol; ivp dye; naprosyn; red blood cells; verapamil; vicodin; and sulfa antibiotics.  MEDICATIONS: has a current medication list which includes the following prescription(s): acetaminophen, azathioprine, vitamin d, co q 10, dexlansoprazole, diclofenac sodium, fish oil-omega-3 fatty acids, folic acid, magnesium oxide, metoprolol tartrate, multivitamin with minerals, prednisone, spironolactone, travoprost (bak free), ursodiol, vitamin b-12, furosemide, potassium chloride sa, and ferumoxytol, and the following Facility-Administered Medications: darbepoetin and diphenhydramine.  SURGICAL HISTORY:  Past Surgical History  Procedure Laterality Date  . Colonoscopy  04/2010  . Upper gastrointestinal endoscopy  04/2010  . Esophagogastroduodenoscopy  09/20/2011    Status post APC.  Marland Kitchen.  Esophagogastroduodenoscopy  09/20/2011    Procedure: ESOPHAGOGASTRODUODENOSCOPY (EGD);  Surgeon: Malissa HippoNajeeb U Rehman, MD;  Location: [facility];  Service: Endoscopy;  Laterality: N/A;  . Cyst removal hand      Elbow    FAMILY HISTORY: family history includes Arthritis in her brother; COPD in her brother; Heart disease (age of onset: 6889) in her mother; Heart disease (age of onset: 6995) in her father; Hypertension in her brother and sister; Osteoporosis in her sister; Stroke in her mother.  SOCIAL HISTORY:  reports that she has been smoking Cigarettes.  She has been smoking about 0.00 packs per day. She has never used smokeless tobacco. She reports that she does not drink alcohol or use illicit drugs.  REVIEW OF SYSTEMS:  Other than that discussed above is noncontributory.  PHYSICAL EXAMINATION: ECOG PERFORMANCE STATUS: 1 - Symptomatic but completely ambulatory  Blood pressure 115/73, pulse 102, temperature 98.2 F (36.8 C), resp. rate 18, weight 147 lb 3.2 oz (66.769 kg).  GENERAL:alert, no distress and comfortable SKIN: skin color, texture, turgor are normal, no rashes or significant lesions EYES: PERLA; Conjunctiva are pink and non-injected, sclera clear SINUSES: No redness or tenderness over maxillary or ethmoid sinuses. No rhinorrhea. OROPHARYNX:no exudate, no erythema on lips, buccal mucosa, or tongue. NECK: supple, thyroid normal size, non-tender, without nodularity. No masses CHEST: Normal AP diameter with no breast masses. LYMPH:  no palpable lymphadenopathy in the cervical, axillary or inguinal LUNGS: clear to auscultation and percussion with normal breathing effort HEART: regular rate & rhythm and no murmurs. ABDOMEN:abdomen soft, non-tender and normal bowel sounds MUSCULOSKELETAL:no cyanosis of digits and no clubbing. Range of motion normal.  NEURO: alert & oriented x 3 with fluent speech, no focal motor/sensory deficits   LABORATORY DATA: Appointment on 05/13/2013    Component Date Value Ref Range Status  . WBC 05/13/2013 2.6* 4.0 - 10.5 K/uL Final  . RBC 05/13/2013 3.80* 3.87 - 5.11 MIL/uL Final  . Hemoglobin 05/13/2013 10.8* 12.0 - 15.0 g/dL Final  . HCT 62/13/086505/11/2013 31.7* 36.0 - 46.0 % Final  . MCV 05/13/2013 83.4  78.0 - 100.0 fL Final  . MCH 05/13/2013 28.4  26.0 - 34.0 pg Final  . MCHC 05/13/2013 34.1  30.0 - 36.0 g/dL Final  . RDW 78/46/962905/11/2013 17.3* 11.5 - 15.5 % Final  . Platelets 05/13/2013 107* 150 - 400 K/uL Final   Comment: SPECIMEN CHECKED FOR CLOTS                          CONSISTENT WITH PREVIOUS RESULT  Infusion on 04/23/2013  Component Date Value Ref Range Status  . WBC 04/23/2013 2.1* 4.0 - 10.5 K/uL Final  . RBC 04/23/2013 3.68* 3.87 - 5.11 MIL/uL Final  . Hemoglobin 04/23/2013 10.8* 12.0 - 15.0 g/dL Final  . HCT 52/84/132404/21/2015 31.8* 36.0 - 46.0 % Final  . MCV 04/23/2013 86.4  78.0 - 100.0 fL Final  . MCH 04/23/2013 29.3  26.0 - 34.0 pg Final  . MCHC 04/23/2013 34.0  30.0 - 36.0 g/dL Final  . RDW 40/10/272504/21/2015 17.9* 11.5 - 15.5 % Final  . Platelets 04/23/2013 83* 150 - 400 K/uL Final   CONSISTENT WITH PREVIOUS RESULT  . Ferritin 04/23/2013 167  10 - 291 ng/mL Final   Performed at [facility]    PATHOLOGY: no new pathology. for Stephenie AcresBADGETT, Ewa ([id number]) Patient: Stephenie AcresBADGETT, Laneice Collected:  07/05/2012 Client: [facility] Accession: [id number] Received: 07/05/2012 Erline Hau, MD DOB: [birth year] Age: 13 Gender: F Reported: 07/09/2012 [address] Patient Ph: [phone] MRN #: [id number] [place], [place] [address] Visit #: [id number] Chart #: Phone: Fax: CC: Malachy Moan, MD BONE MARROW REPORT FINAL DIAGNOSIS Diagnosis Bone Marrow, Aspirate,Biopsy, and Clot, right iliac - HYPERCELLULAR BONE MARROW FOR AGE WITH TRILINEAGE HEMATOPOIESIS. - ABSENT IRON STORES. - SEE COMMENT. PERIPHERAL BLOOD: - MICROCYTIC- HYPOCHROMIC ANEMIA. - THROMBOCYTOPENIA. Diagnosis Note The bone marrow is  hypercellular for age with trilineage hematopoiesis including relative abundance of megakaryocytes. There are subtle dyspoietic changes present, primarily involving the erythroid cell line associated with absent iron stores and lack of ringed sideroblasts. The overall changes are not considered specific and I suspect that some of the findings may be related to iron deficiency. Correlation with cytogenetic studies is strongly recommended. Guerry Bruin MD Pathologist, Electronic Signature (Case signed 07/09/2012) GROSS AND MICROSCOPIC INFORMATION Specimen Clinical Information anemia (kp) Source Bone Marrow, Aspirate,Biopsy, and Clot, right iliac Microscopic LAB DATA: CBC performed on 07/05/2012 shows: WBC 4.8 K/ul Neutrophils 58% HB 7.6 g/dl Lymphocytes 25% 1 of 3 FINAL for Hietala, Oliviagrace ([id number]) Microscopic(continued) HCT 25.2 % Monocytes 8% MCV 66.7 fL Eosinophils 1% RDW 23.9 % Basophils 2% PLT 119 K/ul PERIPHERAL BLOOD SMEAR: The red blood cells display marked anisocytosis with microcytic and normocytic cells. There is marked poikilocytosis with target cells, teardrop cells, elliptocytes, microspherocytes, and scattered schistocytes. There is mild to moderate polychromasia. The white blood cells are normal in number. Scattered neutrophils are slightly hypogranular but appear normally lobated. Significant neutrophilic left shift is not seen on scan. The platelets are decreased in number but appear normogranular for the most part. BONE MARROW ASPIRATE: Erythroid precursors: Progressive maturation with scattered cells displaying nuclear cytoplasmic dyssynchrony, irregular nuclei or lobulated nuclei. Granulocytic precursors: Progressive maturation with scattered hypogranular maturing neutrophilic cells present. Increase in blastic cells is not identified. Megakaryocytes: Abundant with occasional small hypolobated forms. Lymphocytes/plasma cells: Large aggregates not present. TOUCH  PREPARATIONS: A mixture of cell types present. CLOT and BIOPSY: The sections show 40 to 70% cellularity with a mixture of cell types. Expanse sheets of blastic cells are not identified. A few small relatively well circumscribed interstitial lymphoid aggregates mostly composed of small lymphocytes are seen. IRON STAIN: Iron stains are performed on a bone marrow aspirate smear and section of clot. The controls stained appropriately. Storage Iron: Absent. Ringed Sideroblasts: Absent. ADDITIONAL DATA / TESTING: The specimen was sent for cytogenic analysis and a separate report will follow. (BNS:ecj 07/09/2012) Specimen Table Bone Marrow count performed on 500 cells shows: Blasts: 1% Myeloid 44% Promyelocyts: 0% Myelocytes: 10% Erythroid 45% Metamyelocyts: 3% Bands: 18% Lymphocytes: 6% Neutrophils: 9% Eosinophils: 3% Plasma Cells: 3% Basophils: 0% Monocytes: 2% M:E ratio: 1.05 2 of 3 FINAL for Oscarson, Marylouise Stacks ([id number]) Gross Received in [facility] is a 1.2 x 1.0 x 0.2 cm aggregate of tan brown soft tissue fragments which are submitted in toto in cassette A. Received in [facility] is a 1.2 cm tan brown bone core biopsy which is submitted in toto after decalcification in cassette B. (JK:caf 07/05/12) Stain(s) Used in Diagnosis The following stain(s) were used in diagnosing the case: Urinalysis    Component Value Date/Time   COLORURINE YELLOW 08/10/2011 1200   APPEARANCEUR CLEAR 08/10/2011 1200   LABSPEC 1.010 08/10/2011 1200   PHURINE 6.0 08/10/2011 1200   GLUCOSEU NEGATIVE 08/10/2011 1200   HGBUR SMALL* 08/10/2011 1200   BILIRUBINUR SMALL* 08/10/2011 1200  KETONESUR NEGATIVE 08/10/2011 1200   PROTEINUR NEGATIVE 08/10/2011 1200   UROBILINOGEN 0.2 08/10/2011 1200   NITRITE POSITIVE* 08/10/2011 1200   LEUKOCYTESUR SMALL* 08/10/2011 1200    RADIOGRAPHIC STUDIES: No results found.  ASSESSMENT:  #1. Mixed anemia with contributions from chronic kidney disease, chronic blood loss due to AVMs, and hypersplenism  with bone marrow done in July of 2014 showing no evidence of myelodysplasia or malignancy but with absent iron stores. She was given intravenous Feraheme.  For additional Aranesp 500 mcg subcutaneously today. #2. 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 #3. Chronic kidney disease, stage III. #4. Second-degree heart block.   PLAN:  #1. Aranesp 500 pg subcutaneous he today to be repeated in 3 weeks if hemoglobin is less than 11. #2. Office visit with CBC and ferritin in 6 weeks.    All questions were answered. The patient knows to call the clinic with any problems, questions or concerns. We can certainly see the patient much sooner if necessary.   I spent 25 minutes counseling the patient face to face. The total time spent in the appointment was 30 minutes.    Alla German, MD 05/13/2013 11:10 AM  DISCLAIMER:  This note was dictated with voice recognition software.  Similar sounding words can inadvertently be transcribed inaccurately and may not be corrected upon review.

## 2013-05-13 NOTE — Addendum Note (Signed)
Addended by: Evelena Leyden on: 05/13/2013 03:09 PM   Modules accepted: Orders

## 2013-05-13 NOTE — Patient Instructions (Signed)
[facility] Discharge Instructions  RECOMMENDATIONS MADE BY THE CONSULTANT AND ANY TEST RESULTS WILL BE SENT TO YOUR REFERRING PHYSICIAN.  EXAM FINDINGS BY THE PHYSICIAN TODAY AND SIGNS OR SYMPTOMS TO REPORT TO CLINIC OR PRIMARY PHYSICIAN: Exam and findings as discussed by Dr. Zigmund Daniel.  If your hemoglobin is below 11 we will give your aranesp injection. Report increased fatigue or shortness of breath.  MEDICATIONS PRESCRIBED:  none  INSTRUCTIONS/FOLLOW-UP: Follow-up in 3 weeks with labs and possible injection and in 6 weeks with labs, MD visit and possible injection.  Thank you for choosing [facility] to provide your oncology and hematology care.  To afford each patient quality time with our providers, please arrive at least 15 minutes before your scheduled appointment time.  With your help, our goal is to use those 15 minutes to complete the necessary work-up to ensure our physicians have the information they need to help with your evaluation and healthcare recommendations.    Effective January 1st, 2014, we ask that you re-schedule your appointment with our physicians should you arrive 10 or more minutes late for your appointment.  We strive to give you quality time with our providers, and arriving late affects you and other patients whose appointments are after yours.    Again, thank you for choosing [facility].  Our hope is that these requests will decrease the amount of time that you wait before being seen by our physicians.       _____________________________________________________________  Should you have questions after your visit to [facility], please contact our office at [phone] between the hours of 8:30 a.m. and 5:00 p.m.  Voicemails left after 4:30 p.m. will not be returned until the following business day.  For prescription refill requests, have your pharmacy contact our office with your prescription refill  request.

## 2013-05-31 NOTE — Progress Notes (Signed)
Labs drawn

## 2013-06-03 ENCOUNTER — Encounter (HOSPITAL_BASED_OUTPATIENT_CLINIC_OR_DEPARTMENT_OTHER): Payer: Medicare Other

## 2013-06-03 ENCOUNTER — Encounter (HOSPITAL_COMMUNITY): Payer: Medicare Other | Attending: Oncology

## 2013-06-03 ENCOUNTER — Other Ambulatory Visit (INDEPENDENT_AMBULATORY_CARE_PROVIDER_SITE_OTHER): Payer: Self-pay | Admitting: Internal Medicine

## 2013-06-03 VITALS — BP 118/71 | HR 72 | Resp 16

## 2013-06-03 DIAGNOSIS — D500 Iron deficiency anemia secondary to blood loss (chronic): Secondary | ICD-10-CM | POA: Insufficient documentation

## 2013-06-03 DIAGNOSIS — N039 Chronic nephritic syndrome with unspecified morphologic changes: Secondary | ICD-10-CM

## 2013-06-03 DIAGNOSIS — D649 Anemia, unspecified: Secondary | ICD-10-CM

## 2013-06-03 DIAGNOSIS — D61818 Other pancytopenia: Secondary | ICD-10-CM | POA: Insufficient documentation

## 2013-06-03 DIAGNOSIS — N183 Chronic kidney disease, stage 3 unspecified: Secondary | ICD-10-CM

## 2013-06-03 DIAGNOSIS — D631 Anemia in chronic kidney disease: Secondary | ICD-10-CM

## 2013-06-03 DIAGNOSIS — D509 Iron deficiency anemia, unspecified: Secondary | ICD-10-CM | POA: Insufficient documentation

## 2013-06-03 LAB — CBC
HCT: 30.9 % — ABNORMAL LOW (ref 36.0–46.0)
Hemoglobin: 10.5 g/dL — ABNORMAL LOW (ref 12.0–15.0)
MCH: 27.9 pg (ref 26.0–34.0)
MCHC: 34 g/dL (ref 30.0–36.0)
MCV: 82 fL (ref 78.0–100.0)
Platelets: 91 10*3/uL — ABNORMAL LOW (ref 150–400)
RBC: 3.77 MIL/uL — AB (ref 3.87–5.11)
RDW: 17.6 % — AB (ref 11.5–15.5)
WBC: 2.4 10*3/uL — ABNORMAL LOW (ref 4.0–10.5)

## 2013-06-03 MED ORDER — DARBEPOETIN ALFA-POLYSORBATE 500 MCG/ML IJ SOLN
INTRAMUSCULAR | Status: AC
  Filled 2013-06-03: qty 1

## 2013-06-03 MED ORDER — DARBEPOETIN ALFA-POLYSORBATE 300 MCG/0.6ML IJ SOLN
500.0000 ug | INTRAMUSCULAR | Status: DC
  Administered 2013-06-03: 500 ug via SUBCUTANEOUS

## 2013-06-03 NOTE — Progress Notes (Signed)
Eileen Santana presents today for injection per MD orders. Aranesp 500 mcg administered SQ in right Abdomen. Administration without incident. Patient tolerated well.  

## 2013-06-03 NOTE — Progress Notes (Signed)
Labs drawn

## 2013-06-04 NOTE — Telephone Encounter (Signed)
Per Dr.Rehman may fill with 5 additional refills. 

## 2013-06-11 ENCOUNTER — Emergency Department (HOSPITAL_COMMUNITY): Payer: Medicare Other

## 2013-06-11 ENCOUNTER — Ambulatory Visit (INDEPENDENT_AMBULATORY_CARE_PROVIDER_SITE_OTHER): Payer: Medicare Other | Admitting: Internal Medicine

## 2013-06-11 ENCOUNTER — Encounter (INDEPENDENT_AMBULATORY_CARE_PROVIDER_SITE_OTHER): Payer: Self-pay | Admitting: Internal Medicine

## 2013-06-11 ENCOUNTER — Ambulatory Visit (HOSPITAL_COMMUNITY)
Admission: RE | Admit: 2013-06-11 | Discharge: 2013-06-11 | Disposition: A | Discharge status: Home / Self Care | Payer: Medicare Other | Source: Ambulatory Visit | Attending: Internal Medicine | Admitting: Internal Medicine

## 2013-06-11 ENCOUNTER — Inpatient Hospital Stay (HOSPITAL_COMMUNITY)
Admission: EM | Admit: 2013-06-11 | Discharge: 2013-06-13 | DRG: 309 | Disposition: A | Discharge status: Home / Self Care | Payer: Medicare Other | Attending: Internal Medicine | Admitting: Internal Medicine

## 2013-06-11 ENCOUNTER — Encounter (HOSPITAL_COMMUNITY): Payer: Self-pay | Admitting: Emergency Medicine

## 2013-06-11 VITALS — BP 118/72 | HR 82 | Temp 97.0°F | Resp 18 | Ht 66.0 in | Wt 146.3 lb

## 2013-06-11 DIAGNOSIS — Z8249 Family history of ischemic heart disease and other diseases of the circulatory system: Secondary | ICD-10-CM

## 2013-06-11 DIAGNOSIS — J449 Chronic obstructive pulmonary disease, unspecified: Secondary | ICD-10-CM | POA: Diagnosis present

## 2013-06-11 DIAGNOSIS — K754 Autoimmune hepatitis: Secondary | ICD-10-CM

## 2013-06-11 DIAGNOSIS — M069 Rheumatoid arthritis, unspecified: Secondary | ICD-10-CM | POA: Diagnosis present

## 2013-06-11 DIAGNOSIS — F17200 Nicotine dependence, unspecified, uncomplicated: Secondary | ICD-10-CM | POA: Diagnosis present

## 2013-06-11 DIAGNOSIS — I4719 Other supraventricular tachycardia: Secondary | ICD-10-CM | POA: Diagnosis present

## 2013-06-11 DIAGNOSIS — B3781 Candidal esophagitis: Secondary | ICD-10-CM

## 2013-06-11 DIAGNOSIS — J4489 Other specified chronic obstructive pulmonary disease: Secondary | ICD-10-CM | POA: Diagnosis present

## 2013-06-11 DIAGNOSIS — D649 Anemia, unspecified: Secondary | ICD-10-CM | POA: Diagnosis not present

## 2013-06-11 DIAGNOSIS — Z823 Family history of stroke: Secondary | ICD-10-CM

## 2013-06-11 DIAGNOSIS — K219 Gastro-esophageal reflux disease without esophagitis: Secondary | ICD-10-CM | POA: Diagnosis not present

## 2013-06-11 DIAGNOSIS — I509 Heart failure, unspecified: Secondary | ICD-10-CM | POA: Diagnosis present

## 2013-06-11 DIAGNOSIS — D509 Iron deficiency anemia, unspecified: Secondary | ICD-10-CM | POA: Diagnosis present

## 2013-06-11 DIAGNOSIS — I422 Other hypertrophic cardiomyopathy: Secondary | ICD-10-CM

## 2013-06-11 DIAGNOSIS — I10 Essential (primary) hypertension: Secondary | ICD-10-CM | POA: Diagnosis present

## 2013-06-11 DIAGNOSIS — E8809 Other disorders of plasma-protein metabolism, not elsewhere classified: Secondary | ICD-10-CM | POA: Diagnosis present

## 2013-06-11 DIAGNOSIS — Z8262 Family history of osteoporosis: Secondary | ICD-10-CM

## 2013-06-11 DIAGNOSIS — I129 Hypertensive chronic kidney disease with stage 1 through stage 4 chronic kidney disease, or unspecified chronic kidney disease: Secondary | ICD-10-CM | POA: Diagnosis present

## 2013-06-11 DIAGNOSIS — Z87442 Personal history of urinary calculi: Secondary | ICD-10-CM

## 2013-06-11 DIAGNOSIS — M1990 Unspecified osteoarthritis, unspecified site: Secondary | ICD-10-CM | POA: Diagnosis present

## 2013-06-11 DIAGNOSIS — I471 Supraventricular tachycardia: Secondary | ICD-10-CM | POA: Diagnosis present

## 2013-06-11 DIAGNOSIS — R000 Tachycardia, unspecified: Secondary | ICD-10-CM

## 2013-06-11 DIAGNOSIS — I498 Other specified cardiac arrhythmias: Secondary | ICD-10-CM

## 2013-06-11 DIAGNOSIS — R1084 Generalized abdominal pain: Secondary | ICD-10-CM

## 2013-06-11 DIAGNOSIS — I4892 Unspecified atrial flutter: Secondary | ICD-10-CM | POA: Diagnosis present

## 2013-06-11 DIAGNOSIS — R7989 Other specified abnormal findings of blood chemistry: Secondary | ICD-10-CM

## 2013-06-11 DIAGNOSIS — I4891 Unspecified atrial fibrillation: Principal | ICD-10-CM | POA: Diagnosis present

## 2013-06-11 DIAGNOSIS — N390 Urinary tract infection, site not specified: Secondary | ICD-10-CM | POA: Diagnosis present

## 2013-06-11 DIAGNOSIS — N289 Disorder of kidney and ureter, unspecified: Secondary | ICD-10-CM

## 2013-06-11 DIAGNOSIS — N183 Chronic kidney disease, stage 3 unspecified: Secondary | ICD-10-CM | POA: Diagnosis present

## 2013-06-11 DIAGNOSIS — Z79899 Other long term (current) drug therapy: Secondary | ICD-10-CM

## 2013-06-11 HISTORY — DX: Personal history of other diseases of the digestive system: Z87.19

## 2013-06-11 HISTORY — DX: Adverse effect of glucocorticoids and synthetic analogues, initial encounter: G72.0

## 2013-06-11 HISTORY — DX: Adverse effect of glucocorticoids and synthetic analogues, initial encounter: T38.0X5A

## 2013-06-11 HISTORY — DX: Essential (primary) hypertension: I10

## 2013-06-11 HISTORY — DX: Iron deficiency anemia, unspecified: D50.9

## 2013-06-11 LAB — PRO B NATRIURETIC PEPTIDE: Pro B Natriuretic peptide (BNP): 1496 pg/mL — ABNORMAL HIGH (ref 0–450)

## 2013-06-11 LAB — URINALYSIS, ROUTINE W REFLEX MICROSCOPIC
BILIRUBIN URINE: NEGATIVE
Glucose, UA: NEGATIVE mg/dL
Hgb urine dipstick: NEGATIVE
Ketones, ur: NEGATIVE mg/dL
Nitrite: POSITIVE — AB
PH: 6 (ref 5.0–8.0)
Protein, ur: NEGATIVE mg/dL
Specific Gravity, Urine: 1.015 (ref 1.005–1.030)
Urobilinogen, UA: 0.2 mg/dL (ref 0.0–1.0)

## 2013-06-11 LAB — COMPREHENSIVE METABOLIC PANEL
ALBUMIN: 2.7 g/dL — AB (ref 3.5–5.2)
ALT: 11 U/L (ref 0–35)
AST: 62 U/L — ABNORMAL HIGH (ref 0–37)
Alkaline Phosphatase: 68 U/L (ref 39–117)
BUN: 19 mg/dL (ref 6–23)
CO2: 25 mEq/L (ref 19–32)
Calcium: 10.8 mg/dL — ABNORMAL HIGH (ref 8.4–10.5)
Chloride: 101 mEq/L (ref 96–112)
Creatinine, Ser: 1.07 mg/dL (ref 0.50–1.10)
GFR calc Af Amer: 55 mL/min — ABNORMAL LOW (ref >90)
GFR calc non Af Amer: 48 mL/min — ABNORMAL LOW (ref >90)
Glucose, Bld: 112 mg/dL — ABNORMAL HIGH (ref 70–99)
Potassium: 4.4 mEq/L (ref 3.7–5.3)
SODIUM: 138 meq/L (ref 137–147)
TOTAL PROTEIN: 6.9 g/dL (ref 6.0–8.3)
Total Bilirubin: 0.8 mg/dL (ref 0.3–1.2)

## 2013-06-11 LAB — CBC WITH DIFFERENTIAL/PLATELET
Basophils Absolute: 0 10*3/uL (ref 0.0–0.1)
Basophils Relative: 1 % (ref 0–1)
Eosinophils Absolute: 0.1 10*3/uL (ref 0.0–0.7)
Eosinophils Relative: 2 % (ref 0–5)
HEMATOCRIT: 33.6 % — AB (ref 36.0–46.0)
Hemoglobin: 11.6 g/dL — ABNORMAL LOW (ref 12.0–15.0)
LYMPHS PCT: 17 % (ref 12–46)
Lymphs Abs: 0.5 10*3/uL — ABNORMAL LOW (ref 0.7–4.0)
MCH: 27.9 pg (ref 26.0–34.0)
MCHC: 34.5 g/dL (ref 30.0–36.0)
MCV: 80.8 fL (ref 78.0–100.0)
MONOS PCT: 10 % (ref 3–12)
Monocytes Absolute: 0.3 10*3/uL (ref 0.1–1.0)
NEUTROS ABS: 1.9 10*3/uL (ref 1.7–7.7)
NEUTROS PCT: 70 % (ref 43–77)
Platelets: 143 10*3/uL — ABNORMAL LOW (ref 150–400)
RBC: 4.16 MIL/uL (ref 3.87–5.11)
RDW: 17.7 % — AB (ref 11.5–15.5)
WBC: 2.8 10*3/uL — AB (ref 4.0–10.5)

## 2013-06-11 LAB — TROPONIN I: Troponin I: <0.3 ng/mL (ref <0.30)

## 2013-06-11 LAB — URINE MICROSCOPIC-ADD ON

## 2013-06-11 LAB — MRSA PCR SCREENING: MRSA by PCR: NEGATIVE

## 2013-06-11 LAB — LIPASE, BLOOD: Lipase: 58 U/L (ref 11–59)

## 2013-06-11 MED ORDER — POTASSIUM CHLORIDE CRYS ER 20 MEQ PO TBCR
20.0000 meq | EXTENDED_RELEASE_TABLET | ORAL | Status: DC
  Administered 2013-06-12: 20 meq via ORAL
  Filled 2013-06-11: qty 1

## 2013-06-11 MED ORDER — DICLOFENAC SODIUM 1 % TD GEL
TRANSDERMAL | Status: AC
  Filled 2013-06-11: qty 100

## 2013-06-11 MED ORDER — LABETALOL HCL 5 MG/ML IV SOLN
2.0000 mg/min | INTRAVENOUS | Status: DC
  Filled 2013-06-11: qty 100

## 2013-06-11 MED ORDER — VITAMIN B-12 1000 MCG PO TABS
1000.0000 ug | ORAL_TABLET | Freq: Every day | ORAL | Status: DC
  Administered 2013-06-12 – 2013-06-13 (×2): 1000 ug via ORAL
  Filled 2013-06-11 (×2): qty 1

## 2013-06-11 MED ORDER — DILTIAZEM LOAD VIA INFUSION
10.0000 mg | Freq: Once | INTRAVENOUS | Status: AC
  Administered 2013-06-11: 10 mg via INTRAVENOUS
  Filled 2013-06-11: qty 10

## 2013-06-11 MED ORDER — LABETALOL HCL 5 MG/ML IV SOLN
INTRAVENOUS | Status: AC
  Filled 2013-06-11: qty 20

## 2013-06-11 MED ORDER — DIPHENHYDRAMINE HCL 25 MG PO CAPS
50.0000 mg | ORAL_CAPSULE | Freq: Once | ORAL | Status: AC
  Administered 2013-06-11: 50 mg via ORAL

## 2013-06-11 MED ORDER — AZATHIOPRINE 50 MG PO TABS
75.0000 mg | ORAL_TABLET | Freq: Every day | ORAL | Status: DC
  Administered 2013-06-12 – 2013-06-13 (×2): 75 mg via ORAL
  Filled 2013-06-11 (×3): qty 2

## 2013-06-11 MED ORDER — COENZYME Q10 200 MG PO CAPS: 200.0000 mg | ORAL_CAPSULE | Freq: Every day | ORAL | Status: DC

## 2013-06-11 MED ORDER — URSODIOL 300 MG PO CAPS
300.0000 mg | ORAL_CAPSULE | Freq: Two times a day (BID) | ORAL | Status: DC
  Administered 2013-06-11 – 2013-06-13 (×4): 300 mg via ORAL
  Filled 2013-06-11 (×6): qty 1

## 2013-06-11 MED ORDER — VITAMIN D3 25 MCG (1000 UNIT) PO TABS
2000.0000 [IU] | ORAL_TABLET | Freq: Every day | ORAL | Status: DC
  Administered 2013-06-12 – 2013-06-13 (×2): 2000 [IU] via ORAL
  Filled 2013-06-11 (×4): qty 2

## 2013-06-11 MED ORDER — MAGNESIUM OXIDE 400 MG PO TABS
400.0000 mg | ORAL_TABLET | ORAL | Status: DC
  Administered 2013-06-12: 400 mg via ORAL
  Filled 2013-06-11 (×2): qty 1

## 2013-06-11 MED ORDER — FOLIC ACID 800 MCG PO TABS: 800.0000 ug | ORAL_TABLET | Freq: Every day | ORAL | Status: DC

## 2013-06-11 MED ORDER — FUROSEMIDE 10 MG/ML IJ SOLN
60.0000 mg | Freq: Once | INTRAMUSCULAR | Status: AC
  Administered 2013-06-11: 60 mg via INTRAVENOUS
  Filled 2013-06-11: qty 6

## 2013-06-11 MED ORDER — METOPROLOL TARTRATE 25 MG PO TABS
12.5000 mg | ORAL_TABLET | Freq: Two times a day (BID) | ORAL | Status: DC
  Administered 2013-06-11: 12.5 mg via ORAL
  Filled 2013-06-11: qty 1

## 2013-06-11 MED ORDER — OMEGA-3-ACID ETHYL ESTERS 1 G PO CAPS
1.0000 | ORAL_CAPSULE | Freq: Every day | ORAL | Status: DC
  Administered 2013-06-12 – 2013-06-13 (×2): 1 g via ORAL
  Filled 2013-06-11 (×2): qty 1

## 2013-06-11 MED ORDER — VITAMIN D 50 MCG (2000 UT) PO TABS: 2000.0000 [IU] | ORAL_TABLET | Freq: Every day | ORAL | Status: DC

## 2013-06-11 MED ORDER — SPIRONOLACTONE 25 MG PO TABS
50.0000 mg | ORAL_TABLET | Freq: Every day | ORAL | Status: DC
  Administered 2013-06-12 – 2013-06-13 (×2): 50 mg via ORAL
  Filled 2013-06-11 (×2): qty 2

## 2013-06-11 MED ORDER — DICLOFENAC SODIUM 1 % TD GEL
1.0000 "application " | Freq: Every day | TRANSDERMAL | Status: DC | PRN
  Filled 2013-06-11: qty 100

## 2013-06-11 MED ORDER — DILTIAZEM HCL 100 MG IV SOLR
5.0000 mg/h | INTRAVENOUS | Status: DC
  Administered 2013-06-11: 5 mg/h via INTRAVENOUS
  Filled 2013-06-11: qty 100

## 2013-06-11 MED ORDER — URSODIOL 300 MG PO CAPS
ORAL_CAPSULE | ORAL | Status: AC
  Filled 2013-06-11: qty 1

## 2013-06-11 MED ORDER — ADULT MULTIVITAMIN W/MINERALS CH
1.0000 | ORAL_TABLET | Freq: Every day | ORAL | Status: DC
  Administered 2013-06-11 – 2013-06-12 (×2): 1 via ORAL
  Filled 2013-06-11 (×4): qty 1

## 2013-06-11 MED ORDER — ACETAMINOPHEN 325 MG PO TABS: 650.0000 mg | ORAL_TABLET | Freq: Four times a day (QID) | ORAL | Status: DC | PRN

## 2013-06-11 MED ORDER — DIPHENHYDRAMINE HCL 25 MG PO CAPS
ORAL_CAPSULE | ORAL | Status: AC
Start: 2013-06-11 — End: 2013-06-12
  Filled 2013-06-11: qty 2

## 2013-06-11 MED ORDER — PREDNISONE 5 MG PO TABS
7.5000 mg | ORAL_TABLET | Freq: Every day | ORAL | Status: DC
  Filled 2013-06-11: qty 1

## 2013-06-11 MED ORDER — LATANOPROST 0.005 % OP SOLN
OPHTHALMIC | Status: AC
  Filled 2013-06-11: qty 2.5

## 2013-06-11 MED ORDER — FUROSEMIDE 20 MG PO TABS
20.0000 mg | ORAL_TABLET | Freq: Every day | ORAL | Status: DC | PRN
  Administered 2013-06-12 – 2013-06-13 (×2): 20 mg via ORAL
  Filled 2013-06-11 (×2): qty 1

## 2013-06-11 MED ORDER — LABETALOL HCL 5 MG/ML IV SOLN
10.0000 mg | Freq: Once | INTRAVENOUS | Status: AC
  Administered 2013-06-11: 10 mg via INTRAVENOUS
  Filled 2013-06-11: qty 4

## 2013-06-11 MED ORDER — FOLIC ACID 1 MG PO TABS
1.0000 mg | ORAL_TABLET | Freq: Every day | ORAL | Status: DC
  Administered 2013-06-12 – 2013-06-13 (×2): 1 mg via ORAL
  Filled 2013-06-11 (×2): qty 1

## 2013-06-11 MED ORDER — PANTOPRAZOLE SODIUM 40 MG PO TBEC
40.0000 mg | DELAYED_RELEASE_TABLET | Freq: Every day | ORAL | Status: DC
Start: 2013-06-11 — End: 2013-06-13
  Administered 2013-06-11 – 2013-06-13 (×3): 40 mg via ORAL
  Filled 2013-06-11 (×3): qty 1

## 2013-06-11 MED ORDER — DEXTROSE 5 % IV SOLN
1.0000 g | Freq: Once | INTRAVENOUS | Status: AC
  Administered 2013-06-11: 1 g via INTRAVENOUS
  Filled 2013-06-11: qty 10

## 2013-06-11 MED ORDER — SODIUM CHLORIDE 0.9 % IJ SOLN
3.0000 mL | Freq: Two times a day (BID) | INTRAMUSCULAR | Status: DC
  Administered 2013-06-11 – 2013-06-13 (×3): 3 mL via INTRAVENOUS

## 2013-06-11 MED ORDER — LATANOPROST 0.005 % OP SOLN
1.0000 [drp] | Freq: Every day | OPHTHALMIC | Status: DC
  Administered 2013-06-11 – 2013-06-12 (×2): 1 [drp] via OPHTHALMIC
  Filled 2013-06-11: qty 2.5

## 2013-06-11 NOTE — ED Provider Notes (Signed)
CSN: [id number]     Arrival date & time 06/11/13  1238 History  This chart was scribed for Gerhard Munch, MD by Shari Heritage, ED Scribe. The patient was seen in room APA12/APA12. Patient's care was started at 1:13 PM.  Chief Complaint  Patient presents with  . Atrial Flutter    The history is provided by the patient. No language interpreter was used.    HPI Comments: Eileen Santana is a 80 y.o. female with history of CHF, paroxysmal atrial fibrillation, nephropathy with CKD 2-3 who presents to the Emergency Department complaining of atrial flutter. Patient was at an appointment with Dr. Karilyn Cota for a check up when he noticed that patient's heart rate was elevated. Dr. Karilyn Cota sent her to the hospital for an EKG which showed an atrial flutter rate of 134.She denies shortness of breath, fever, chest pain, cough, weight loss, weight gain, leg swelling, ecchymosis or signs of bleeding. Patient has an extensive medical history that also includes an episode of AV heart block in 2013 that was associated with lightheadedness and pallor. She was admitted to the hospital at that time and eventually converted. She also has a history of severe anemia and receives IV iron infusions as needed - her last was in January. Her other medical history includes autoimmune hepatitis, hypertension, gastric ulcer, and pericarditis.   Past Medical History  Diagnosis Date  . H/O: GI bleed d/t NSAIDS  . Anemia     fe def anemia. Transfusion dependent.  . DJD (degenerative joint disease)   . Hypertension   . Colitis, acute hx of   . Ileitis hx of  . Pancytopenia 09/03/2010  . Sedimentation rate elevation 09/03/2010  . Autoimmune hepatitis     With leukocytoclastic vasculitis  . Heart block AV second degree March 2013  . Bronchitis   . Myopathy     Steroid-induced  . Renal calculus 08/12/2011  . Rheumatoid arthritis(714.0)   . Colon ulcer 04/2010    NSAID related.  . Gastric ulcer 04/2010    NSAID related  . Cancer    . UGI bleed 09/20/2011    Focal area of gastritis oozing of blood.  . Gastric AVM 09/20/2011  . Candida esophagitis 09/20/2011  . CHF (congestive heart failure)   . Paroxysmal atrial fibrillation   . Liver problem   . Pericarditis     2D Echo EF 65%-70%  . Chronic kidney disease     probably stage II to III with a recent acute exacerbation  . Cholestatic hepatitis    Past Surgical History  Procedure Laterality Date  . Colonoscopy  04/2010  . Upper gastrointestinal endoscopy  04/2010  . Esophagogastroduodenoscopy  09/20/2011    Status post APC.  Marland Kitchen Esophagogastroduodenoscopy  09/20/2011    Procedure: ESOPHAGOGASTRODUODENOSCOPY (EGD);  Surgeon: Malissa Hippo, MD;  Location: [facility];  Service: Endoscopy;  Laterality: N/A;  . Cyst removal hand      Elbow   Family History  Problem Relation Age of Onset  . Stroke Mother   . Hypertension Sister   . Hypertension Brother   . Heart disease Mother 1  . Heart disease Father 95  . COPD Brother   . Arthritis Brother   . Osteoporosis Sister    History  Substance Use Topics  . Smoking status: Current Some Day Smoker    Types: Cigarettes  . Smokeless tobacco: Never Used  . Alcohol Use: No   OB History   Grav Para Term Preterm Abortions TAB SAB  Ect Mult Living                 Review of Systems  Constitutional:       Per HPI, otherwise negative  HENT:       Per HPI, otherwise negative  Respiratory:       Per HPI, otherwise negative  Cardiovascular:       Per HPI, otherwise negative  Gastrointestinal: Negative for vomiting.  Endocrine:       Negative aside from HPI  Genitourinary:       Neg aside from HPI   Musculoskeletal:       Per HPI, otherwise negative  Skin: Negative.   Neurological: Negative for syncope.      Allergies  Iohexol; Ivp dye; Naprosyn; Red blood cells; Verapamil; Vicodin; and Sulfa antibiotics  Home Medications   Prior to Admission medications   Medication Sig Start Date End Date Taking?  Authorizing Provider  acetaminophen (TYLENOL) 325 MG tablet Take 650 mg by mouth every 6 (six) hours as needed. For pain    Historical Provider, MD  azaTHIOprine (IMURAN) 50 MG tablet TAKE 1 & 1/2 TABLETS ONCE DAILY. 05/02/13   Malissa Hippo, MD  Cholecalciferol (VITAMIN D) 2000 UNITS tablet Take 2,000 Units by mouth daily.    Historical Provider, MD  Coenzyme Q10 (CO Q 10) 100 MG CAPS Take 200 mg by mouth daily.    Historical Provider, MD  Darbepoetin Alfa-Albumin (ARANESP IJ) Inject as directed. PRN    Historical Provider, MD  dexlansoprazole (DEXILANT) 60 MG capsule Take 1 capsule (60 mg total) by mouth every other day. 10/08/12   Malissa Hippo, MD  diclofenac sodium (VOLTAREN) 1 % GEL Apply 1 application topically daily as needed (for pain). Apply to back and legs    Historical Provider, MD  fish oil-omega-3 fatty acids 1000 MG capsule Take 1 g by mouth daily.    Historical Provider, MD  folic acid (FOLVITE) 800 MCG tablet Take 800 mcg by mouth.     Historical Provider, MD  furosemide (LASIX) 20 MG tablet Take 20 mg by mouth daily as needed. For fluid retention 04/26/11   Malissa Hippo, MD  magnesium oxide (MAG-OX) 400 MG tablet Take 400 mg by mouth daily.    Historical Provider, MD  metoprolol tartrate (LOPRESSOR) 25 MG tablet Take 12.5 mg by mouth 2 (two) times daily.    Historical Provider, MD  Multiple Vitamins-Minerals (MULTIVITAMIN WITH MINERALS) tablet Take 1 tablet by mouth daily.      Historical Provider, MD  potassium chloride SA (K-DUR,KLOR-CON) 20 MEQ tablet TAKE 1 TABLET BY MOUTH ONCE DAILY 03/07/13   Malissa Hippo, MD  predniSONE (DELTASONE) 5 MG tablet TAKE 1&1/2 TABLET BY MOUTH EVERY MORNING 11/15/12   Malissa Hippo, MD  sodium chloride 0.9 % SOLN 100 mL with ferumoxytol 510 MG/17ML SOLN 1,020 mg Inject 1,020 mg into the vein once.    Historical Provider, MD  spironolactone (ALDACTONE) 50 MG tablet Take 50 mg by mouth daily.     Historical Provider, MD  Travoprost, BAK  Free, (TRAVATAN) 0.004 % SOLN ophthalmic solution Place 1 drop into both eyes at bedtime. 09/20/11   Elliot Cousin, MD  ursodiol (ACTIGALL) 300 MG capsule Take 300 mg by mouth 2 (two) times daily.  01/24/13   Historical Provider, MD  vitamin B-12 (CYANOCOBALAMIN) 1000 MCG tablet Take 1,000 mcg by mouth daily.    Historical Provider, MD   Triage Vitals: BP 113/82  Pulse  125  Temp(Src) 98 F (36.7 C) (Oral)  Resp 23  Ht 5\' 5"  (1.651 m)  Wt 146 lb 3 oz (66.31 kg)  BMI 24.33 kg/m2  SpO2 100% Physical Exam  Nursing note and vitals reviewed. Constitutional: She is oriented to person, place, and time. She appears well-developed and well-nourished. No distress.  HENT:  Head: Normocephalic and atraumatic.  Eyes: Conjunctivae and EOM are normal.  Cardiovascular: Regular rhythm.  Tachycardia present.   Pulmonary/Chest: Effort normal and breath sounds normal. No stridor. No respiratory distress. She has no wheezes. She has no rales.  Abdominal: She exhibits no distension.  Musculoskeletal: She exhibits no edema.  Neurological: She is alert and oriented to person, place, and time. No cranial nerve deficit.  Skin: Skin is warm and dry.  Psychiatric: She has a normal mood and affect.    ED Course  Procedures (including critical care time)  COORDINATION OF CARE: 1:20 PM- Will order chest x-ray, CMP, UA, blood lipase, BNP, and CBC with diff. Patient informed of current plan for treatment and evaluation and agrees with plan at this time.     Labs Review Labs Reviewed  CBC WITH DIFFERENTIAL - Abnormal; Notable for the following:    WBC 2.8 (*)    Hemoglobin 11.6 (*)    HCT 33.6 (*)    RDW 17.7 (*)    Platelets 143 (*)    Lymphs Abs 0.5 (*)    All other components within normal limits  COMPREHENSIVE METABOLIC PANEL - Abnormal; Notable for the following:    Glucose, Bld 112 (*)    Calcium 10.8 (*)    Albumin 2.7 (*)    AST 62 (*)    GFR calc non Af Amer 48 (*)    GFR calc Af Amer 55 (*)     All other components within normal limits  URINALYSIS, ROUTINE W REFLEX MICROSCOPIC - Abnormal; Notable for the following:    Nitrite POSITIVE (*)    Leukocytes, UA TRACE (*)    All other components within normal limits  PRO B NATRIURETIC PEPTIDE - Abnormal; Notable for the following:    Pro B Natriuretic peptide (BNP) 1496.0 (*)    All other components within normal limits  URINE MICROSCOPIC-ADD ON - Abnormal; Notable for the following:    Squamous Epithelial / LPF FEW (*)    Bacteria, UA MANY (*)    All other components within normal limits  LIPASE, BLOOD    Imaging Review Dg Chest 2 View  06/11/2013   CLINICAL DATA:  Irregular heartbeat  EXAM: CHEST  2 VIEW  COMPARISON:  Portable chest x-ray dated August 10, 2011  FINDINGS: The lungs are mildly hyperinflated. The cardiopericardial silhouette is enlarged but stable. The pulmonary vascularity is not engorged. There is mild tortuosity of the descending thoracic aorta. There is no pleural effusion. The bony thorax is unremarkable.  IMPRESSION: 1. There is mild cardiomegaly without evidence of CHF. 2. There is mild hyperinflation consistent with COPD.   Electronically Signed   By: David  [place]   On: 06/11/2013 13:55    ECG : afib / aflutter , rate 134 abnormal  On monitor the patient has tachycardia, 130, abnormal   Update: Following provision of the middle of patient's heart rate dropped briefly into the 110/120 range, no rhythm remained similar.   Update: Patient and daughter aware of all results, and with elevated BNP, positive urinary tract infection. Patient received additional Lasix  6:05 PM Heart attack in the 130 range.  Given the resiliency of her dysrhythmia, she will be started on a beta blocker drip. Patient will be admitted to step down unit.   MDM    I personally performed the services described in this documentation, which was scribed in my presence. The recorded information has been reviewed and is  accurate.   Patient presents with concerns of dysrhythmia.  The patient does have prior episodes of atrial flutter, but no recent events. Patient's heart rate remains elevated in spite of multiple beta blocker doses.  Patient's dysrhythmia may be secondary to elevated BNP, and she received concurrent treatment, though with no change in her heart rate substantially. Patient required initiation of a beta blocker drip, admission to step down unit given the persistency of her atrial fibrillation with rapid ventricular response.   CRITICAL CARE Performed by: Gerhard Munchobert Abdulai Blaylock Total critical care time: 35 Critical care time was exclusive of separately billable procedures and treating other patients. Critical care was necessary to treat or prevent imminent or life-threatening deterioration. Critical care was time spent personally by me on the following activities: development of treatment plan with patient and/or surrogate as well as nursing, discussions with consultants, evaluation of patient's response to treatment, examination of patient, obtaining history from patient or surrogate, ordering and performing treatments and interventions, ordering and review of laboratory studies, ordering and review of radiographic studies, pulse oximetry and re-evaluation of patient's condition.    Gerhard Munchobert Gabriel Conry, MD 06/11/13 1806

## 2013-06-11 NOTE — ED Notes (Signed)
Pt was at Dr Patty Sermons office today for "check up" and when he performed his exam, he noticed that pt's hr was elevated, ekg performed at his office showed atrial flutter rate of 134, pt does have hx of atrial flutter, pt alert on arrival to er, denies any symptoms,

## 2013-06-11 NOTE — H&P (Signed)
[facility] History and Physical  Eileen Santana BJY:[id number] DOB: 20-Oct-1930 DOA: 06/11/2013  Referring physician: ER. PCP: [name], MD   Chief Complaint: Asymptomatic tachycardia.  HPI: Eileen Santana is a 80 y.o. female  This is an 80 year old lady who was seen by gastroenterology in the office today. The patient was noted to have a tachycardia and electrocardiogram showed what appeared to be atrial flutter/fibrillation. The patient had remained asymptomatic. Because of the tachycardia, she was sent to the emergency room and in the emergency room, the heart rate went up to the 130s and 140s. She remains asymptomatic. She denies any palpitations, dyspnea or chest pain. She does have a previous history of atrial tachycardia. She has been fairly well controlled on metoprolol. She is now being admitted for further management.   Review of Systems:  Constitutional:  No weight loss, night sweats, Fevers, chills, fatigue.  HEENT:  No headaches, Difficulty swallowing,Tooth/dental problems,Sore throat,  No sneezing, itching, ear ache, nasal congestion, post nasal drip,  Cardio-vascular:  No chest pain, Orthopnea, PND, swelling in lower extremities, anasarca, dizziness, palpitations  GI:  No heartburn, indigestion, abdominal pain, nausea, vomiting, diarrhea, change in bowel habits, loss of appetite  Resp:  No shortness of breath with exertion or at rest. No excess mucus, no productive cough, No non-productive cough, No coughing up of blood.No change in color of mucus.No wheezing.No chest wall deformity  Skin:  no rash or lesions.  GU:  no dysuria, change in color of urine, no urgency or frequency. No flank pain.  Musculoskeletal:  No joint pain or swelling. No decreased range of motion. No back pain.  Psych:  No change in mood or affect. No depression or anxiety. No memory loss.   Past Medical History  Diagnosis Date  . H/O: GI bleed d/t NSAIDS  . Anemia     fe def anemia.  Transfusion dependent.  . DJD (degenerative joint disease)   . Hypertension   . Colitis, acute hx of   . Ileitis hx of  . Pancytopenia 09/03/2010  . Sedimentation rate elevation 09/03/2010  . Autoimmune hepatitis     With leukocytoclastic vasculitis  . Heart block AV second degree March 2013  . Bronchitis   . Myopathy     Steroid-induced  . Renal calculus 08/12/2011  . Rheumatoid arthritis(714.0)   . Colon ulcer 04/2010    NSAID related.  . Gastric ulcer 04/2010    NSAID related  . Cancer   . UGI bleed 09/20/2011    Focal area of gastritis oozing of blood.  . Gastric AVM 09/20/2011  . Candida esophagitis 09/20/2011  . CHF (congestive heart failure)   . Paroxysmal atrial fibrillation   . Liver problem   . Pericarditis     2D Echo EF 65%-70%  . Chronic kidney disease     probably stage II to III with a recent acute exacerbation  . Cholestatic hepatitis    Past Surgical History  Procedure Laterality Date  . Colonoscopy  04/2010  . Upper gastrointestinal endoscopy  04/2010  . Esophagogastroduodenoscopy  09/20/2011    Status post APC.  Marland Kitchen Esophagogastroduodenoscopy  09/20/2011    Procedure: ESOPHAGOGASTRODUODENOSCOPY (EGD);  Surgeon: Malissa Hippo, MD;  Location: [facility];  Service: Endoscopy;  Laterality: N/A;  . Cyst removal hand      Elbow   Social History:  reports that she has been smoking Cigarettes.  She has been smoking about 0.00 packs per day. She has never used smokeless tobacco. She  reports that she does not drink alcohol or use illicit drugs.  Allergies  Allergen Reactions  . Iohexol Swelling    IV Dye   . Ivp Dye [Iodinated Diagnostic Agents] Swelling    Hives  . Naprosyn [Naproxen] Hives and Itching  . Red Blood Cells Swelling and Dermatitis    With blood transfusion 2012  . Verapamil     Heart block (2nd degree)  . Vicodin [Hydrocodone-Acetaminophen] Hives and Itching  . Sulfa Antibiotics Itching and Rash    Family History  Problem Relation Age of  Onset  . Stroke Mother   . Hypertension Sister   . Hypertension Brother   . Heart disease Mother 28  . Heart disease Father 95  . COPD Brother   . Arthritis Brother   . Osteoporosis Sister      Prior to Admission medications   Medication Sig Start Date End Date Taking? Authorizing Provider  acetaminophen (TYLENOL) 325 MG tablet Take 650 mg by mouth every 6 (six) hours as needed. For pain   Yes Historical Provider, MD  azaTHIOprine (IMURAN) 50 MG tablet TAKE 1 & 1/2 TABLETS ONCE DAILY. 05/02/13  Yes Malissa Hippo, MD  Cholecalciferol (VITAMIN D) 2000 UNITS tablet Take 2,000 Units by mouth daily.   Yes Historical Provider, MD  Coenzyme Q10 200 MG capsule Take 200 mg by mouth daily.   Yes Historical Provider, MD  dexlansoprazole (DEXILANT) 60 MG capsule Take 1 capsule (60 mg total) by mouth every other day. 10/08/12  Yes Malissa Hippo, MD  diclofenac sodium (VOLTAREN) 1 % GEL Apply 1 application topically daily as needed (for pain). Apply to back and legs   Yes Historical Provider, MD  fish oil-omega-3 fatty acids 1000 MG capsule Take 1 g by mouth daily.   Yes Historical Provider, MD  folic acid (FOLVITE) 800 MCG tablet Take 800 mcg by mouth daily.    Yes Historical Provider, MD  magnesium oxide (MAG-OX) 400 MG tablet Take 400 mg by mouth 4 (four) times a week. Takes Monday Wednesday Friday and Saturday   Yes Historical Provider, MD  metoprolol tartrate (LOPRESSOR) 25 MG tablet Take 12.5 mg by mouth 2 (two) times daily.   Yes Historical Provider, MD  Multiple Vitamins-Minerals (MULTIVITAMIN WITH MINERALS) tablet Take 1 tablet by mouth daily.     Yes Historical Provider, MD  potassium chloride SA (K-DUR,KLOR-CON) 20 MEQ tablet Take 20 mEq by mouth See admin instructions. Takes 5 times weekly on Monday, Wednesday, Friday, Saturday and Sunday   Yes Historical Provider, MD  predniSONE (DELTASONE) 5 MG tablet TAKE 1&1/2 TABLET BY MOUTH EVERY MORNING 11/15/12  Yes Malissa Hippo, MD    spironolactone (ALDACTONE) 50 MG tablet Take 50 mg by mouth daily.    Yes Historical Provider, MD  Travoprost, BAK Free, (TRAVATAN) 0.004 % SOLN ophthalmic solution Place 1 drop into both eyes at bedtime. 09/20/11  Yes Elliot Cousin, MD  ursodiol (ACTIGALL) 300 MG capsule Take 300 mg by mouth 2 (two) times daily.  01/24/13  Yes Historical Provider, MD  vitamin B-12 (CYANOCOBALAMIN) 1000 MCG tablet Take 1,000 mcg by mouth daily.   Yes Historical Provider, MD  Darbepoetin Alfa-Albumin (ARANESP IJ) Inject as directed. PRN    Historical Provider, MD  furosemide (LASIX) 20 MG tablet Take 20 mg by mouth daily as needed. For fluid retention 04/26/11   Malissa Hippo, MD  sodium chloride 0.9 % SOLN 100 mL with ferumoxytol 510 MG/17ML SOLN 1,020 mg Inject 1,020 mg into  the vein as needed.     Historical Provider, MD   Physical Exam: Filed Vitals:   06/11/13 1905  BP: 118/67  Pulse: 132  Temp:   Resp:     BP 118/67  Pulse 132  Temp(Src) 98 F (36.7 C) (Oral)  Resp 26  Ht 5\' 5"  (1.651 m)  Wt 64.9 kg (143 lb 1.3 oz)  BMI 23.81 kg/m2  SpO2 96%  General:  Appears calm and comfortable Eyes: PERRL, normal lids, irises & conjunctiva ENT: grossly normal hearing, lips & tongue Neck: no LAD, masses or thyromegaly Cardiovascular: Irregular. no m/r/g. No LE edema. Telemetry: Irregular rhythm. It looks like atrial fibrillation/atrial flutter but I'm not convinced of this. Respiratory: CTA bilaterally, no w/r/r. Normal respiratory effort. Abdomen: soft, ntnd Skin: no rash or induration seen on limited exam Musculoskeletal: grossly normal tone BUE/BLE Psychiatric: grossly normal mood and affect, speech fluent and appropriate Neurologic: grossly non-focal.          Labs on Admission:  Basic Metabolic Panel:  Recent Labs Lab 06/11/13 1339  NA 138  K 4.4  CL 101  CO2 25  GLUCOSE 112*  BUN 19  CREATININE 1.07  CALCIUM 10.8*   Liver Function Tests:  Recent Labs Lab 06/11/13 1339  AST  62*  ALT 11  ALKPHOS 68  BILITOT 0.8  PROT 6.9  ALBUMIN 2.7*    Recent Labs Lab 06/11/13 1339  LIPASE 58   No results found for this basename: AMMONIA,  in the last 168 hours CBC:  Recent Labs Lab 06/11/13 1324  WBC 2.8*  NEUTROABS 1.9  HGB 11.6*  HCT 33.6*  MCV 80.8  PLT 143*   Cardiac Enzymes: No results found for this basename: CKTOTAL, CKMB, CKMBINDEX, TROPONINI,  in the last 168 hours  BNP (last 3 results)  Recent Labs  06/11/13 1339  PROBNP 1496.0*   CBG: No results found for this basename: GLUCAP,  in the last 168 hours  Radiological Exams on Admission: Dg Chest 2 View  06/11/2013   CLINICAL DATA:  Irregular heartbeat  EXAM: CHEST  2 VIEW  COMPARISON:  Portable chest x-ray dated August 10, 2011  FINDINGS: The lungs are mildly hyperinflated. The cardiopericardial silhouette is enlarged but stable. The pulmonary vascularity is not engorged. There is mild tortuosity of the descending thoracic aorta. There is no pleural effusion. The bony thorax is unremarkable.  IMPRESSION: 1. There is mild cardiomegaly without evidence of CHF. 2. There is mild hyperinflation consistent with COPD.   Electronically Signed   By: David  [place]   On: 06/11/2013 13:55    EKG: Independently reviewed. Possible atrial fibrillation. Previously she has been documented to have atrial tachycardia. No acute ST-T wave elevation.  Assessment/Plan   1. Atrial tachycardia. Asymptomatic. 2. Autoimmune hepatitis, in remission. 3. Hypertension. 4. UTI. 5. Hypoalbuminemia with elevated calcium levels.  Plan: 1. Admit to step down unit. 2. Intravenous Cardizem drip to control ventricular rate. 3. Cardiology consultation. 4. Monitor calcium levels and obtain ionized calcium levels.  Further recommendations will depend on patient's hospital progress.   Code Status: Full code.   Family Communication: I discussed the plan with patient at the bedside.   Disposition Plan: Home when medically  stable.   Time spent: 60 minutes.  Wilson Singer [facility] Pager [phone].  **Disclaimer: This note may have been dictated with voice recognition software. Similar sounding words can inadvertently be transcribed and this note may contain transcription errors which may not have been corrected  upon publication of note.**

## 2013-06-11 NOTE — Progress Notes (Signed)
Presenting complaint;  Followup for chronic liver disease and GERD.  Subjective:  Patient is [name] was here for scheduled visit. She was last seen 4 months ago. He is accompanied by her daughter Eileen Santana. Her only complaint today is intermittent back pain. She gets some relief with Tylenol. She has been advised not to take NSAIDs because of history of GI bleed in the past. She says if she did not have back problems she can run a marathon. She denies lower extremity weakness or numbness. Her daughter states she had x-rays of her lumbar spine year before last and was told she had arthritis. She remains with good appetite. Her weight has been stable. Heartburns well-controlled with therapy. She drinks a lot of water. She does get up 5-6 times each night to urinate. She denies dysuria or hematuria. Bowels move daily. She denies melena or rectal bleeding. She denies chest pain shortness of breath or lightheadedness.  Current Medications: Outpatient Encounter Prescriptions as of 06/11/2013  Medication Sig  . acetaminophen (TYLENOL) 325 MG tablet Take 650 mg by mouth every 6 (six) hours as needed. For pain  . azaTHIOprine (IMURAN) 50 MG tablet TAKE 1 & 1/2 TABLETS ONCE DAILY.  Marland Kitchen Cholecalciferol (VITAMIN D) 2000 UNITS tablet Take 2,000 Units by mouth daily.  . Coenzyme Q10 (CO Q 10) 100 MG CAPS Take 200 mg by mouth daily.  . Darbepoetin Alfa-Albumin (ARANESP IJ) Inject as directed. PRN  . dexlansoprazole (DEXILANT) 60 MG capsule Take 1 capsule (60 mg total) by mouth every other day.  . diclofenac sodium (VOLTAREN) 1 % GEL Apply 1 application topically daily as needed (for pain). Apply to back and legs  . fish oil-omega-3 fatty acids 1000 MG capsule Take 1 g by mouth daily.  . folic acid (FOLVITE) 800 MCG tablet Take 800 mcg by mouth.   . furosemide (LASIX) 20 MG tablet Take 20 mg by mouth daily as needed. For fluid retention  . magnesium oxide (MAG-OX) 400 MG tablet Take 400  mg by mouth daily.  . metoprolol tartrate (LOPRESSOR) 25 MG tablet Take 12.5 mg by mouth 2 (two) times daily.  . Multiple Vitamins-Minerals (MULTIVITAMIN WITH MINERALS) tablet Take 1 tablet by mouth daily.    . potassium chloride SA (K-DUR,KLOR-CON) 20 MEQ tablet TAKE 1 TABLET BY MOUTH ONCE DAILY  . predniSONE (DELTASONE) 5 MG tablet TAKE 1&1/2 TABLET BY MOUTH EVERY MORNING  . sodium chloride 0.9 % SOLN 100 mL with ferumoxytol 510 MG/17ML SOLN 1,020 mg Inject 1,020 mg into the vein once.  Marland Kitchen spironolactone (ALDACTONE) 50 MG tablet Take 50 mg by mouth daily.   . Travoprost, BAK Free, (TRAVATAN) 0.004 % SOLN ophthalmic solution Place 1 drop into both eyes at bedtime.  . ursodiol (ACTIGALL) 300 MG capsule Take 300 mg by mouth 2 (two) times daily.   . vitamin B-12 (CYANOCOBALAMIN) 1000 MCG tablet Take 1,000 mcg by mouth daily.  . [DISCONTINUED] ursodiol (ACTIGALL) 250 MG tablet TAKE 2 TABLETS BY MOUTH TWICE DAILY     Objective: Blood pressure 118/72, pulse 82, temperature 97 F (36.1 C), temperature source Oral, resp. rate 18, height 5\' 6"  (1.676 m), weight 146 lb 4.8 oz (66.361 kg). Repeat heart rate 112 per minute and irregular. Patient is alert and in no acute distress. No asterixis noted. Conjunctiva is pink. Sclera is nonicteric Oropharyngeal mucosa is normal. No neck masses or thyromegaly noted. Cardiac exam with irregular rhythm normal S1 and S2. No murmur or gallop noted. Lungs are  clear to auscultation. Abdomen symmetrical soft and nontender. Liver edge is firm. Spleen is nonpalpable.  No LE edema or clubbing noted.  Labs/studies Results: CBC from 06/03/2013 WBC 2.4, H&H 10.5 and 30.9 and platelet count 91K.    Assessment:  #1. Autoimmune hepatitis. She is in biochemical remission but her serum albumin 3 months ago was low possibly an error. She has had no problems with fluid retention. If transaminases remain normal would consider dropping prednisone dose to 5 mg daily after  conferring with Dr. Zigmund Daniel. #2. Pancytopenia. It appears to be multifactorial. She has undergone extensive evaluation in the past including bone marrow. Chronic liver disease may be contributing but she does not have evidence of advanced cirrhosis. She also has history of GI bleed but none at the present time.  She is receiving Aranesp and iron infusion intermittently through oncology clinic #3. GERD. She is doing well with Dexilant extending milligrams every other day. #4. Tachyarrhythmia. Heart rate is 110s per minute and regular. She has history of PAT but EKG in May 2014 reveals normal sinus rhythm.   Plan:  Patient advised to take no more than 2 g of Tylenol per day. She will have comprehensive chemistry panel with her next blood work on 06/24/2013. Patient will go to lab for EKG today. Patient will have comprehensive chemistry panel with blood draw on 06/24/2013. Office visit in 4 months.

## 2013-06-11 NOTE — ED Notes (Signed)
Pt had ekg performed prior to coming to er < per Dr Effie Shy ekg did not have to be repeated in er,

## 2013-06-11 NOTE — Patient Instructions (Signed)
Can take Tylenol up to 2 g per day and no more. Physician will call with the results of blood work when completed.

## 2013-06-12 ENCOUNTER — Other Ambulatory Visit: Payer: Self-pay

## 2013-06-12 ENCOUNTER — Encounter (HOSPITAL_COMMUNITY): Payer: Self-pay | Admitting: Cardiology

## 2013-06-12 DIAGNOSIS — I4891 Unspecified atrial fibrillation: Secondary | ICD-10-CM | POA: Diagnosis present

## 2013-06-12 DIAGNOSIS — N289 Disorder of kidney and ureter, unspecified: Secondary | ICD-10-CM

## 2013-06-12 DIAGNOSIS — R52 Pain, unspecified: Secondary | ICD-10-CM

## 2013-06-12 DIAGNOSIS — R1084 Generalized abdominal pain: Secondary | ICD-10-CM

## 2013-06-12 DIAGNOSIS — I422 Other hypertrophic cardiomyopathy: Secondary | ICD-10-CM

## 2013-06-12 DIAGNOSIS — I4892 Unspecified atrial flutter: Secondary | ICD-10-CM

## 2013-06-12 DIAGNOSIS — D649 Anemia, unspecified: Secondary | ICD-10-CM

## 2013-06-12 LAB — COMPREHENSIVE METABOLIC PANEL
ALBUMIN: 2.5 g/dL — AB (ref 3.5–5.2)
ALT: 12 U/L (ref 0–35)
AST: 67 U/L — AB (ref 0–37)
Alkaline Phosphatase: 78 U/L (ref 39–117)
BUN: 18 mg/dL (ref 6–23)
CALCIUM: 10.4 mg/dL (ref 8.4–10.5)
CO2: 28 mEq/L (ref 19–32)
CREATININE: 1.09 mg/dL (ref 0.50–1.10)
Chloride: 97 mEq/L (ref 96–112)
GFR calc Af Amer: 54 mL/min — ABNORMAL LOW (ref >90)
GFR, EST NON AFRICAN AMERICAN: 47 mL/min — AB (ref >90)
Glucose, Bld: 128 mg/dL — ABNORMAL HIGH (ref 70–99)
Potassium: 3.7 mEq/L (ref 3.7–5.3)
Sodium: 137 mEq/L (ref 137–147)
Total Bilirubin: 0.5 mg/dL (ref 0.3–1.2)
Total Protein: 6.6 g/dL (ref 6.0–8.3)

## 2013-06-12 LAB — TROPONIN I

## 2013-06-12 LAB — CBC
HEMATOCRIT: 32.3 % — AB (ref 36.0–46.0)
HEMOGLOBIN: 11.3 g/dL — AB (ref 12.0–15.0)
MCH: 28.1 pg (ref 26.0–34.0)
MCHC: 35 g/dL (ref 30.0–36.0)
MCV: 80.3 fL (ref 78.0–100.0)
Platelets: 125 10*3/uL — ABNORMAL LOW (ref 150–400)
RBC: 4.02 MIL/uL (ref 3.87–5.11)
RDW: 17.7 % — ABNORMAL HIGH (ref 11.5–15.5)
WBC: 3.2 10*3/uL — ABNORMAL LOW (ref 4.0–10.5)

## 2013-06-12 LAB — CALCIUM, IONIZED: Calcium, Ion: 1.41 mmol/L — ABNORMAL HIGH (ref 1.13–1.30)

## 2013-06-12 LAB — TSH: TSH: 1.11 u[IU]/mL (ref 0.350–4.500)

## 2013-06-12 MED ORDER — CEFTRIAXONE SODIUM 1 G IJ SOLR
1.0000 g | INTRAMUSCULAR | Status: DC
  Administered 2013-06-12: 1 g via INTRAVENOUS
  Filled 2013-06-12 (×2): qty 10

## 2013-06-12 MED ORDER — POTASSIUM CHLORIDE CRYS ER 20 MEQ PO TBCR
40.0000 meq | EXTENDED_RELEASE_TABLET | Freq: Once | ORAL | Status: AC
Start: 2013-06-12 — End: 2013-06-12
  Administered 2013-06-12: 40 meq via ORAL
  Filled 2013-06-12: qty 2

## 2013-06-12 MED ORDER — PREDNISONE 5 MG PO TABS
7.5000 mg | ORAL_TABLET | Freq: Every day | ORAL | Status: DC
  Administered 2013-06-12 – 2013-06-13 (×2): 7.5 mg via ORAL
  Filled 2013-06-12 (×3): qty 1.5

## 2013-06-12 MED ORDER — DILTIAZEM HCL 60 MG PO TABS
60.0000 mg | ORAL_TABLET | Freq: Four times a day (QID) | ORAL | Status: DC
  Administered 2013-06-12 – 2013-06-13 (×5): 60 mg via ORAL
  Filled 2013-06-12 (×5): qty 1

## 2013-06-12 MED ORDER — METOPROLOL TARTRATE 25 MG PO TABS
25.0000 mg | ORAL_TABLET | Freq: Two times a day (BID) | ORAL | Status: DC
  Administered 2013-06-12 – 2013-06-13 (×3): 25 mg via ORAL
  Filled 2013-06-12 (×3): qty 1

## 2013-06-12 MED ORDER — DILTIAZEM HCL 100 MG IV SOLR
5.0000 mg/h | INTRAVENOUS | Status: DC
  Filled 2013-06-12: qty 100

## 2013-06-12 MED ORDER — ADULT MULTIVITAMIN W/MINERALS CH
1.0000 | ORAL_TABLET | Freq: Every day | ORAL | Status: DC
  Administered 2013-06-13: 1 via ORAL
  Filled 2013-06-12: qty 1

## 2013-06-12 MED ORDER — SIMETHICONE 40 MG/0.6ML PO SUSP
ORAL | Status: AC
  Filled 2013-06-12: qty 1.2

## 2013-06-12 NOTE — Progress Notes (Signed)
UR chart review completed.  

## 2013-06-12 NOTE — Consult Note (Signed)
Primary cardiologist: Dr. Thurmon Fair Consulting cardiologist: Dr. Jonelle Sidle  Clinical Summary Eileen Santana is a medically complex 80 y.o.female admitted to the hospital yesterday, originally seen in the GI clinic for evaluation and found to be tachycardic, subsequent ECG demonstrating atrial flutter, and evaluated at that point in the ER. She has not been aware of any sense of palpitations, chest pain, or shortness of breath.  History includes possible paroxysmal atrial fibrillation in the past, not clearly documented based on review of the records. She at least has had MAT and frequent PACs noted, no previous documented atrial flutter. Recent serial ECGs are consistent with atrial flutter and variable conduction, initially with rapid ventricular response. Cardiac markers argue against ACS.  CHADSVASC score is at least 4, although she has a history of recurrent GI bleeds, iron deficiency anemia that has been transfusion dependent. In reviewing previous cardiology notes, she has been on neither aspirin nor anticoagulant treatments.  Echocardiogram from March 2013 revealed severe concentric LVH with LVEF greater than 70%, grade 1 diastolic dysfunction with increased filling pressures, mild mitral regurgitation, mild to moderate left atrial enlargement.   Allergies  Allergen Reactions  . Iohexol Swelling    IV Dye   . Ivp Dye [Iodinated Diagnostic Agents] Swelling    Hives  . Naprosyn [Naproxen] Hives and Itching  . Red Blood Cells Swelling and Dermatitis    With blood transfusion 2012  . Verapamil     Heart block (2nd degree)  . Vicodin [Hydrocodone-Acetaminophen] Hives and Itching  . Sulfa Antibiotics Itching and Rash    Medications Scheduled Medications: . azaTHIOprine  75 mg Oral Daily  . cholecalciferol  2,000 Units Oral Daily  . folic acid  1 mg Oral Daily  . latanoprost  1 drop Both Eyes QHS  . magnesium oxide  400 mg Oral Q M,W,F,Sa-1800  . metoprolol  tartrate  12.5 mg Oral BID  . multivitamin with minerals  1 tablet Oral Daily  . omega-3 acid ethyl esters  1 capsule Oral Daily  . pantoprazole  40 mg Oral Daily  . potassium chloride SA  20 mEq Oral Q M,W,F,S,S -1800  . predniSONE  7.5 mg Oral Q breakfast  . simethicone      . sodium chloride  3 mL Intravenous Q12H  . spironolactone  50 mg Oral Daily  . ursodiol  300 mg Oral BID  . vitamin B-12  1,000 mcg Oral Daily     Infusions: . diltiazem (CARDIZEM) infusion 15 mg/hr (06/12/13 0629)     PRN Medications:  acetaminophen, diclofenac sodium, furosemide   Past Medical History  Diagnosis Date  . History of GI bleed     Associated with NSAIDS  . Iron deficiency anemia     Transfusion dependent  . DJD (degenerative joint disease)   . Essential hypertension, benign   . History of colitis   . Ileitis   . Pancytopenia   . Autoimmune hepatitis     With leukocytoclastic vasculitis  . Heart block AV second degree March 2013  . Bronchitis   . Steroid-induced myopathy   . Renal calculus 08/12/2011  . Rheumatoid arthritis(714.0)   . Colon ulcer 04/2010    NSAID related  . Gastric ulcer 04/2010    NSAID related  . Cancer   . UGI bleed 09/20/2011    Focal area of gastritis oozing of blood  . Gastric AVM 09/20/2011  . Candida esophagitis 09/20/2011  . Paroxysmal atrial fibrillation   . Pericarditis  2D Echo EF 65%-70%  . Chronic kidney disease     Probably stage II to III with a recent acute exacerbation  . Cholestatic hepatitis     Past Surgical History  Procedure Laterality Date  . Colonoscopy  04/2010  . Upper gastrointestinal endoscopy  04/2010  . Esophagogastroduodenoscopy  09/20/2011    Status post APC.  Marland Kitchen Esophagogastroduodenoscopy  09/20/2011    Procedure: ESOPHAGOGASTRODUODENOSCOPY (EGD);  Surgeon: Malissa Hippo, MD;  Location: [facility];  Service: Endoscopy;  Laterality: N/A;  . Cyst removal hand      Elbow    Family History  Problem Relation Age of  Onset  . Stroke Mother   . Hypertension Sister   . Hypertension Brother   . Heart disease Mother 45  . Heart disease Father 95  . COPD Brother   . Arthritis Brother   . Osteoporosis Sister     Social History Eileen Santana reports that she has been smoking Cigarettes.  She has been smoking about 0.00 packs per day. She has never used smokeless tobacco. Eileen Santana reports that she does not drink alcohol.  Review of Systems Reports no fevers or chills, no cough, no change in bowel or bladder habits, no obvious melena. Otherwise as outlined above.  Physical Examination Blood pressure 112/77, pulse 138, temperature 98 F (36.7 C), temperature source Oral, resp. rate 14, height 5\' 5"  (1.651 m), weight 143 lb 1.3 oz (64.9 kg), SpO2 100.00%.  Intake/Output Summary (Last 24 hours) at 06/12/13 0744 Last data filed at 06/12/13 0500  Gross per 24 hour  Intake 230.99 ml  Output   2775 ml  Net -2544.01 ml   Telemetry: Atrial flutter with variable conduction.  Patient appears comfortable at rest. HEENT: Conjunctiva and lids normal, oropharynx clear. Neck: Supple, no elevated JVP or carotid bruits, no thyromegaly. Lungs: Clear to auscultation, nonlabored breathing at rest. Cardiac: Irregular, no S3, soft apical systolic murmur, no pericardial rub. Abdomen: Soft, nontender, bowel sounds present, no guarding or rebound. Extremities: No pitting edema, distal pulses 2+. Skin: Warm and dry. Musculoskeletal: No kyphosis. Neuropsychiatric: Alert and oriented x3, affect grossly appropriate.   Lab Results  Basic Metabolic Panel:  Recent Labs Lab 06/11/13 1339 06/12/13 0124  NA 138 137  K 4.4 3.7  CL 101 97  CO2 25 28  GLUCOSE 112* 128*  BUN 19 18  CREATININE 1.07 1.09  CALCIUM 10.8* 10.4    Liver Function Tests:  Recent Labs Lab 06/11/13 1339 06/12/13 0124  AST 62* 67*  ALT 11 12  ALKPHOS 68 78  BILITOT 0.8 0.5  PROT 6.9 6.6  ALBUMIN 2.7* 2.5*    CBC:  Recent  Labs Lab 06/11/13 1324 06/12/13 0124  WBC 2.8* 3.2*  NEUTROABS 1.9  --   HGB 11.6* 11.3*  HCT 33.6* 32.3*  MCV 80.8 80.3  PLT 143* 125*    Cardiac Enzymes:  Recent Labs Lab 06/11/13 1954 06/12/13 0124  TROPONINI <0.30 <0.30    Pro-BNP: 1496   Imaging CHEST 2 VIEW  COMPARISON: Portable chest x-ray dated August 10, 2011  FINDINGS: The lungs are mildly hyperinflated. The cardiopericardial silhouette is enlarged but stable. The pulmonary vascularity is not engorged. There is mild tortuosity of the descending thoracic aorta. There is no pleural effusion. The bony thorax is unremarkable.  IMPRESSION: 1. There is mild cardiomegaly without evidence of CHF. 2. There is mild hyperinflation consistent with COPD.   Impression  1. Atrial flutter, duration uncertain. Patient presented with rapid  ventricular rates, although relatively asymptomatic with no sense of palpitations, chest pain, or shortness of breath. She is now on diltiazem infusion with heart rate coming down, although blood pressure is also decreasing. She is in no distress. CHADSVASC score is at least 4, although she has a history of recurrent GI bleeding and iron deficiency anemia that has been transfusion dependent. She is a poor candidate for anticoagulation, and also therefore cardioversion.  2. history of recurrent GI bleeds and iron deficiency anemia as outlined above.  3. History of autoimmune hepatitis, followed by GI.  4. CKD, stage 2 to 3.  5. Probable hypertrophic cardiomyopathy, LVEF greater than 70% with severe LVH as of 2013.  6. Question of previous second degree heart block, although review of cardiology notes indicates that this may have been actually blocked PACs when the patient was on verapamil.   Recommendations  Hopefully heart rate can be controlled with a combination of Lopressor and diltiazem. Holding off aspirin or anticoagulants given high bleeding risk as documented above. This  also therefore means that we will not pursue cardioversion in the absence of anticoagulation. Try and transition to oral diltiazem and increase Lopressor dose slightly.   Jonelle Sidle, M.D., F.A.C.C.

## 2013-06-12 NOTE — Care Management Note (Addendum)
    Page 1 of 1   06/13/2013     12:42:00 PM CARE MANAGEMENT NOTE 06/13/2013  Patient:  Eileen Santana, Eileen Santana   Account Number:  [id number]  Date Initiated:  06/12/2013  Documentation initiated by:  Sharrie Rothman  Subjective/Objective Assessment:   Pt admitted from home with a flutter. Pt lives with 2 daughters and will return home at discharge. Pt stated that she is very independent at home. Pt has a cane, walker, and BSC from previous health issues.     Action/Plan:   No CM needs noted.   Anticipated DC Date:  06/14/2013   Anticipated DC Plan:  HOME/SELF CARE      DC Planning Services  CM consult      Choice offered to / List presented to:             Status of service:  Completed, signed off Medicare Important Message given?   (If response is "NO", the following Medicare IM given date fields will be blank) Date Medicare IM given:   Date Additional Medicare IM given:    Discharge Disposition:  HOME/SELF CARE  Per UR Regulation:    If discussed at Long Length of Stay Meetings, dates discussed:    Comments:  06/13/13 1240 Arlyss Queen, RN BSN CM Pt discharged today. No CM needs noted.  06/12/13 1540 Arlyss Queen, RN BSN CM

## 2013-06-12 NOTE — Progress Notes (Signed)
[facility] PROGRESS NOTE   Eileen Santana CHE:[id number] DOB: 10/17/1930 DOA: 06/11/2013 PCP: [name], MD  HPI/Subjective: Denies any palpitations, chest pain or shortness of breath.  Assessment/Plan: Active Problems:   Autoimmune hepatitis   HTN (hypertension), benign   UTI (urinary tract infection)   Hypoalbuminemia   Atrial tachycardia   Atrial flutter   Atrial flutter -With rapid ventricular rate, patient started on Cardizem drip and admitted to the ICU. -Seen by cardiology, heart rate is more controlled. -Poor candidate for anticoagulation because of recurrent GI bleed/iron deficiency anemia. -Continue Cardizem, switch to oral when the heart rate is controlled.  UTI -Received one dose of Rocephin, will continue. -Adjust antibiotics according to the culture results.  CKD stay 2-3 -At baseline, creatinine hit 1.0.  Hypertension -Continue home medications.  Code Status: Full code Family Communication: Plan discussed with the patient. Disposition Plan: Remains inpatient   Consultants:  Cardiology  Procedures:  None  Antibiotics:  None   Objective: Filed Vitals:   06/12/13 1130  BP:   Pulse:   Temp: 98.4 F (36.9 C)  Resp:     Intake/Output Summary (Last 24 hours) at 06/12/13 1326 Last data filed at 06/12/13 1100  Gross per 24 hour  Intake 860.99 ml  Output   2775 ml  Net -1914.01 ml   Filed Weights   06/11/13 1256 06/11/13 1905 06/12/13 0500  Weight: 66.31 kg (146 lb 3 oz) 64.9 kg (143 lb 1.3 oz) 64.9 kg (143 lb 1.3 oz)    Exam: General: Alert and awake, oriented x3, not in any acute distress. HEENT: anicteric sclera, pupils reactive to light and accommodation, EOMI CVS: S1-S2 clear, no murmur rubs or gallops Chest: clear to auscultation bilaterally, no wheezing, rales or rhonchi Abdomen: soft nontender, nondistended, normal bowel sounds, no organomegaly Extremities: no cyanosis, clubbing or edema noted bilaterally Neuro:  Cranial nerves II-XII intact, no focal neurological deficits  Data Reviewed: Basic Metabolic Panel:  Recent Labs Lab 06/11/13 1339 06/12/13 0124  NA 138 137  K 4.4 3.7  CL 101 97  CO2 25 28  GLUCOSE 112* 128*  BUN 19 18  CREATININE 1.07 1.09  CALCIUM 10.8* 10.4   Liver Function Tests:  Recent Labs Lab 06/11/13 1339 06/12/13 0124  AST 62* 67*  ALT 11 12  ALKPHOS 68 78  BILITOT 0.8 0.5  PROT 6.9 6.6  ALBUMIN 2.7* 2.5*    Recent Labs Lab 06/11/13 1339  LIPASE 58   No results found for this basename: AMMONIA,  in the last 168 hours CBC:  Recent Labs Lab 06/11/13 1324 06/12/13 0124  WBC 2.8* 3.2*  NEUTROABS 1.9  --   HGB 11.6* 11.3*  HCT 33.6* 32.3*  MCV 80.8 80.3  PLT 143* 125*   Cardiac Enzymes:  Recent Labs Lab 06/11/13 1954 06/12/13 0124 06/12/13 0742  TROPONINI <0.30 <0.30 <0.30   BNP (last 3 results)  Recent Labs  06/11/13 1339  PROBNP 1496.0*   CBG: No results found for this basename: GLUCAP,  in the last 168 hours  Micro Recent Results (from the past 240 hour(s))  MRSA PCR SCREENING     Status: None   Collection Time    06/11/13  7:30 PM      Result Value Ref Range Status   MRSA by PCR NEGATIVE  NEGATIVE Final   Comment:            The GeneXpert MRSA Assay (FDA     approved for NASAL specimens  only), is one component of a     comprehensive MRSA colonization     surveillance program. It is not     intended to diagnose MRSA     infection nor to guide or     monitor treatment for     MRSA infections.     Studies: Dg Chest 2 View  06/11/2013   CLINICAL DATA:  Irregular heartbeat  EXAM: CHEST  2 VIEW  COMPARISON:  Portable chest x-ray dated August 10, 2011  FINDINGS: The lungs are mildly hyperinflated. The cardiopericardial silhouette is enlarged but stable. The pulmonary vascularity is not engorged. There is mild tortuosity of the descending thoracic aorta. There is no pleural effusion. The bony thorax is unremarkable.   IMPRESSION: 1. There is mild cardiomegaly without evidence of CHF. 2. There is mild hyperinflation consistent with COPD.   Electronically Signed   By: David  [place]   On: 06/11/2013 13:55    Scheduled Meds: . azaTHIOprine  75 mg Oral Daily  . cholecalciferol  2,000 Units Oral Daily  . diltiazem  60 mg Oral 4 times per day  . folic acid  1 mg Oral Daily  . latanoprost  1 drop Both Eyes QHS  . magnesium oxide  400 mg Oral Q M,W,F,Sa-1800  . metoprolol tartrate  25 mg Oral BID  . [START ON 06/13/2013] multivitamin with minerals  1 tablet Oral Daily  . omega-3 acid ethyl esters  1 capsule Oral Daily  . pantoprazole  40 mg Oral Daily  . potassium chloride SA  20 mEq Oral Q M,W,F,S,S -1800  . predniSONE  7.5 mg Oral Q breakfast  . simethicone      . sodium chloride  3 mL Intravenous Q12H  . spironolactone  50 mg Oral Daily  . ursodiol  300 mg Oral BID  . vitamin B-12  1,000 mcg Oral Daily   Continuous Infusions: . diltiazem (CARDIZEM) infusion Stopped (06/12/13 0815)       Time spent: 35 minutes    [facility]  [facility] Pager [phone] If 7PM-7AM, please contact night-coverage at [email or web address], password [facility] 06/12/2013, 1:26 PM  LOS: 1 day

## 2013-06-13 DIAGNOSIS — B3781 Candidal esophagitis: Secondary | ICD-10-CM

## 2013-06-13 LAB — BASIC METABOLIC PANEL
BUN: 20 mg/dL (ref 6–23)
CALCIUM: 9.9 mg/dL (ref 8.4–10.5)
CO2: 28 mEq/L (ref 19–32)
Chloride: 99 mEq/L (ref 96–112)
Creatinine, Ser: 1.19 mg/dL — ABNORMAL HIGH (ref 0.50–1.10)
GFR calc non Af Amer: 42 mL/min — ABNORMAL LOW (ref >90)
GFR, EST AFRICAN AMERICAN: 49 mL/min — AB (ref >90)
GLUCOSE: 89 mg/dL (ref 70–99)
POTASSIUM: 4.5 meq/L (ref 3.7–5.3)
Sodium: 137 mEq/L (ref 137–147)

## 2013-06-13 LAB — CBC
HCT: 32.1 % — ABNORMAL LOW (ref 36.0–46.0)
Hemoglobin: 11 g/dL — ABNORMAL LOW (ref 12.0–15.0)
MCH: 27.5 pg (ref 26.0–34.0)
MCHC: 34.3 g/dL (ref 30.0–36.0)
MCV: 80.3 fL (ref 78.0–100.0)
Platelets: 128 10*3/uL — ABNORMAL LOW (ref 150–400)
RBC: 4 MIL/uL (ref 3.87–5.11)
RDW: 17.8 % — AB (ref 11.5–15.5)
WBC: 3.4 10*3/uL — ABNORMAL LOW (ref 4.0–10.5)

## 2013-06-13 MED ORDER — DILTIAZEM HCL ER COATED BEADS 120 MG PO CP24: 120.0000 mg | ORAL_CAPSULE | Freq: Every day | ORAL | Status: DC

## 2013-06-13 MED ORDER — CEFUROXIME AXETIL 500 MG PO TABS: 500.0000 mg | ORAL_TABLET | Freq: Two times a day (BID) | ORAL | Status: DC

## 2013-06-13 MED ORDER — METOPROLOL TARTRATE 25 MG PO TABS
25.0000 mg | ORAL_TABLET | Freq: Two times a day (BID) | ORAL | Status: DC
Start: 2013-06-13 — End: 2013-06-27

## 2013-06-13 MED ORDER — DILTIAZEM HCL ER COATED BEADS 120 MG PO CP24
120.0000 mg | ORAL_CAPSULE | Freq: Every day | ORAL | Status: DC
  Administered 2013-06-13: 120 mg via ORAL
  Filled 2013-06-13: qty 1

## 2013-06-13 NOTE — Progress Notes (Signed)
.    Primary cardiologist: Dr. Thurmon Fair  Consulting cardiologist: Dr. Jonelle Sidle  Subjective:   No chest pain or shortness of breath at rest, no palpitations.   Objective:   Temp:  [98 F (36.7 C)-98.4 F (36.9 C)] 98.3 F (36.8 C) (06/11 0730) Pulse Rate:  [99-100] 99 (06/10 2000) Resp:  [12-27] 12 (06/11 0600) BP: (92-131)/(11-83) 100/54 mmHg (06/11 0600) SpO2:  [97 %-99 %] 98 % (06/11 0600) Last BM Date: 06/12/13  Filed Weights   06/11/13 1256 06/11/13 1905 06/12/13 0500  Weight: 146 lb 3 oz (66.31 kg) 143 lb 1.3 oz (64.9 kg) 143 lb 1.3 oz (64.9 kg)    Intake/Output Summary (Last 24 hours) at 06/13/13 0853 Last data filed at 06/13/13 0500  Gross per 24 hour  Intake   1303 ml  Output   1950 ml  Net   -647 ml    Telemetry: Normal sinus rhythm.  Exam:  General: Comfortable at rest.  Lungs: Clear, nonlabored.  Cardiac: RRR, no gallop.  Extremities: No pitting edema.   Lab Results:  Basic Metabolic Panel:  Recent Labs Lab 06/11/13 1339 06/12/13 0124 06/13/13 0514  NA 138 137 137  K 4.4 3.7 4.5  CL 101 97 99  CO2 25 28 28   GLUCOSE 112* 128* 89  BUN 19 18 20   CREATININE 1.07 1.09 1.19*  CALCIUM 10.8* 10.4 9.9    Liver Function Tests:  Recent Labs Lab 06/11/13 1339 06/12/13 0124  AST 62* 67*  ALT 11 12  ALKPHOS 68 78  BILITOT 0.8 0.5  PROT 6.9 6.6  ALBUMIN 2.7* 2.5*    CBC:  Recent Labs Lab 06/11/13 1324 06/12/13 0124 06/13/13 0514  WBC 2.8* 3.2* 3.4*  HGB 11.6* 11.3* 11.0*  HCT 33.6* 32.3* 32.1*  MCV 80.8 80.3 80.3  PLT 143* 125* 128*    Cardiac Enzymes:  Recent Labs Lab 06/11/13 1954 06/12/13 0124 06/12/13 0742  TROPONINI <0.30 <0.30 <0.30    ECG: Normal sinus rhythm.   Medications:   Scheduled Medications: . azaTHIOprine  75 mg Oral Daily  . cefTRIAXone (ROCEPHIN)  IV  1 g Intravenous Q24H  . cholecalciferol  2,000 Units Oral Daily  . diltiazem  60 mg Oral 4 times per day  . folic acid  1 mg  Oral Daily  . latanoprost  1 drop Both Eyes QHS  . magnesium oxide  400 mg Oral Q M,W,F,Sa-1800  . metoprolol tartrate  25 mg Oral BID  . multivitamin with minerals  1 tablet Oral Daily  . omega-3 acid ethyl esters  1 capsule Oral Daily  . pantoprazole  40 mg Oral Daily  . potassium chloride SA  20 mEq Oral Q M,W,F,S,S -1800  . predniSONE  7.5 mg Oral Q breakfast  . sodium chloride  3 mL Intravenous Q12H  . spironolactone  50 mg Oral Daily  . ursodiol  300 mg Oral BID  . vitamin B-12  1,000 mcg Oral Daily      PRN Medications:  acetaminophen, diclofenac sodium, furosemide   Assessment:   1. Atrial flutter, duration uncertain. Patient presented with rapid ventricular rates, although relatively asymptomatic with no sense of palpitations, chest pain, or shortness of breath. She has converted spontaneously to sinus rhythm with rate control. CHADSVASC score is at least 4, although she has a history of recurrent GI bleeding and iron deficiency anemia that has been transfusion dependent. She is a poor candidate for anticoagulation.   2. History of recurrent GI  bleeds and iron deficiency anemia as outlined above.   3. History of autoimmune hepatitis, followed by GI.   4. CKD, stage 2 to 3.   5. Probable hypertrophic cardiomyopathy, LVEF greater than 70% with severe LVH as of 2013.   6. Question of previous second degree heart block, although review of cardiology notes indicates that this may have been actually blocked PACs when the patient was on verapamil.   Plan/Discussion:    Would have patient ambulate today, if stable likely ready for discharge home. Would convert short acting Cardizem to Cardizem CD 120 mg daily, and continue current dose of Lopressor at 25 mg twice daily. No plan for antiplatelets or anticoagulants as noted above given high bleeding risk. She needs to followup with Dr. Royann Shivers or associate in the next 2 weeks.   Jonelle Sidle, M.D., F.A.C.C.

## 2013-06-13 NOTE — Progress Notes (Signed)
Discharge instructions given. Pt in nsl, denies any sob. Iv d/c'd  Discharged to home.

## 2013-06-13 NOTE — Discharge Summary (Signed)
Physician Discharge Summary  Eileen Santana TML:[id number] DOB: July 28, 1930 DOA: 06/11/2013  PCP: [name], MD  Admit date: 06/11/2013 Discharge date: 06/13/2013  Time spent: 40 minutes  Recommendations for Outpatient Follow-up:  1. Followup with cardiology in 2 weeks. Patient wants to get a local cardiologist in [place], [place].  Discharge Diagnoses:  Active Problems:   Autoimmune hepatitis   HTN (hypertension), benign   UTI (urinary tract infection)   Hypoalbuminemia   Atrial tachycardia   Atrial flutter   Discharge Condition: Stable  Diet recommendation: Heart healthy diet  Filed Weights   06/11/13 1256 06/11/13 1905 06/12/13 0500  Weight: 66.31 kg (146 lb 3 oz) 64.9 kg (143 lb 1.3 oz) 64.9 kg (143 lb 1.3 oz)    History of present illness:  Eileen Santana is a 80 y.o. female  This is an 80 year old lady who was seen by gastroenterology in the office today. The patient was noted to have a tachycardia and electrocardiogram showed what appeared to be atrial flutter/fibrillation. The patient had remained asymptomatic. Because of the tachycardia, she was sent to the emergency room and in the emergency room, the heart rate went up to the 130s and 140s. She remains asymptomatic. She denies any palpitations, dyspnea or chest pain. She does have a previous history of atrial tachycardia. She has been fairly well controlled on metoprolol. She is now being admitted for further management.  Hospital Course:   Atrial flutter  -With rapid ventricular rate, patient started on Cardizem drip and admitted to the ICU on admission.  -Seen by cardiology, heart rate is more controlled.  -Poor candidate for anticoagulation because of recurrent GI bleed/iron deficiency anemia.  -Heart rate controlled with IV Cardizem drip so switched to oral Cardizem, and Lopressor increased. -Recommendation for discharge is Cardizem 120 CD, Lopressor 25 twice a day  UTI  -Received one dose of Rocephin. -Cultures  not back at the time of discharge, prescribe Ceftin for 3 more days.  CKD stage 2-3  -At baseline, creatinine hit 1.0.   Hypertension  -Continue home medications.   Probable hypertrophic cardiomyopathy -LVEF is greater than 70% with severe LVH from old echo done in 2013. -Patient followup with cardiology, she is on metoprolol and diuretics. -Likely she will need 2-D echo to be done as outpatient.  Procedures:  None  Consultations:  Cards  Discharge Exam: Filed Vitals:   06/13/13 0900  BP: 116/59  Pulse:   Temp:   Resp: 14   General: Alert and awake, oriented x3, not in any acute distress. HEENT: anicteric sclera, pupils reactive to light and accommodation, EOMI CVS: S1-S2 clear, no murmur rubs or gallops Chest: clear to auscultation bilaterally, no wheezing, rales or rhonchi Abdomen: soft nontender, nondistended, normal bowel sounds, no organomegaly Extremities: no cyanosis, clubbing or edema noted bilaterally Neuro: Cranial nerves II-XII intact, no focal neurological deficits  Discharge Instructions You were cared for by a hospitalist during your hospital stay. If you have any questions about your discharge medications or the care you received while you were in the hospital after you are discharged, you can call the unit and asked to speak with the hospitalist on call if the hospitalist that took care of you is not available. Once you are discharged, your primary care physician will handle any further medical issues. Please note that NO REFILLS for any discharge medications will be authorized once you are discharged, as it is imperative that you return to your primary care physician (or establish a relationship with a primary  care physician if you do not have one) for your aftercare needs so that they can reassess your need for medications and monitor your lab values.      Discharge Instructions   Diet - low sodium heart healthy    Complete by:  As directed      Increase  activity slowly    Complete by:  As directed             Medication List         acetaminophen 325 MG tablet  Commonly known as:  TYLENOL  Take 650 mg by mouth every 6 (six) hours as needed. For pain     ARANESP IJ  Inject as directed. PRN     azaTHIOprine 50 MG tablet  Commonly known as:  IMURAN  TAKE 1 & 1/2 TABLETS ONCE DAILY.     cefUROXime 500 MG tablet  Commonly known as:  CEFTIN  Take 1 tablet (500 mg total) by mouth 2 (two) times daily with a meal.     Coenzyme Q10 200 MG capsule  Take 200 mg by mouth daily.     dexlansoprazole 60 MG capsule  Commonly known as:  DEXILANT  Take 1 capsule (60 mg total) by mouth every other day.     diclofenac sodium 1 % Gel  Commonly known as:  VOLTAREN  Apply 1 application topically daily as needed (for pain). Apply to back and legs     diltiazem 120 MG 24 hr capsule  Commonly known as:  CARDIZEM CD  Take 1 capsule (120 mg total) by mouth daily.     fish oil-omega-3 fatty acids 1000 MG capsule  Take 1 g by mouth daily.     folic acid 800 MCG tablet  Commonly known as:  FOLVITE  Take 800 mcg by mouth daily.     furosemide 20 MG tablet  Commonly known as:  LASIX  Take 20 mg by mouth daily as needed. For fluid retention     magnesium oxide 400 MG tablet  Commonly known as:  MAG-OX  Take 400 mg by mouth 4 (four) times a week. Takes Monday Wednesday Friday and Saturday     metoprolol tartrate 25 MG tablet  Commonly known as:  LOPRESSOR  Take 1 tablet (25 mg total) by mouth 2 (two) times daily.     multivitamin with minerals tablet  Take 1 tablet by mouth daily.     potassium chloride SA 20 MEQ tablet  Commonly known as:  K-DUR,KLOR-CON  Take 20 mEq by mouth See admin instructions. Takes 5 times weekly on Monday, Wednesday, Friday, Saturday and Sunday     predniSONE 5 MG tablet  Commonly known as:  DELTASONE  TAKE 1&1/2 TABLET BY MOUTH EVERY MORNING     sodium chloride 0.9 % SOLN 100 mL with ferumoxytol 510  MG/17ML SOLN 1,020 mg  Inject 1,020 mg into the vein as needed.     spironolactone 50 MG tablet  Commonly known as:  ALDACTONE  Take 50 mg by mouth daily.     Travoprost (BAK Free) 0.004 % Soln ophthalmic solution  Commonly known as:  TRAVATAN  Place 1 drop into both eyes at bedtime.     ursodiol 300 MG capsule  Commonly known as:  ACTIGALL  Take 300 mg by mouth 2 (two) times daily.     vitamin B-12 1000 MCG tablet  Commonly known as:  CYANOCOBALAMIN  Take 1,000 mcg by mouth daily.     Vitamin  D 2000 UNITS tablet  Take 2,000 Units by mouth daily.       Allergies  Allergen Reactions  . Iohexol Swelling    IV Dye   . Ivp Dye [Iodinated Diagnostic Agents] Swelling    Hives  . Naprosyn [Naproxen] Hives and Itching  . Red Blood Cells Swelling and Dermatitis    With blood transfusion 2012  . Verapamil     Heart block (2nd degree)  . Vicodin [Hydrocodone-Acetaminophen] Hives and Itching  . Sulfa Antibiotics Itching and Rash   Follow-up Information   Follow up with HILL,GERALD K, MD In 1 week.   Specialty:  Family Medicine   Contact information:   [address] [place] [address] [phone]       Follow up with Nona Dell, MD In 2 weeks.   Specialty:  Cardiology   Contact information:   [address] [place] [address] [phone]        The results of significant diagnostics from this hospitalization (including imaging, microbiology, ancillary and laboratory) are listed below for reference.    Significant Diagnostic Studies: Dg Chest 2 View  06/11/2013   CLINICAL DATA:  Irregular heartbeat  EXAM: CHEST  2 VIEW  COMPARISON:  Portable chest x-ray dated August 10, 2011  FINDINGS: The lungs are mildly hyperinflated. The cardiopericardial silhouette is enlarged but stable. The pulmonary vascularity is not engorged. There is mild tortuosity of the descending thoracic aorta. There is no pleural effusion. The bony thorax is unremarkable.   IMPRESSION: 1. There is mild cardiomegaly without evidence of CHF. 2. There is mild hyperinflation consistent with COPD.   Electronically Signed   By: David  [place]   On: 06/11/2013 13:55    Microbiology: Recent Results (from the past 240 hour(s))  MRSA PCR SCREENING     Status: None   Collection Time    06/11/13  7:30 PM      Result Value Ref Range Status   MRSA by PCR NEGATIVE  NEGATIVE Final   Comment:            The GeneXpert MRSA Assay (FDA     approved for NASAL specimens     only), is one component of a     comprehensive MRSA colonization     surveillance program. It is not     intended to diagnose MRSA     infection nor to guide or     monitor treatment for     MRSA infections.     Labs: Basic Metabolic Panel:  Recent Labs Lab 06/11/13 1339 06/12/13 0124 06/13/13 0514  NA 138 137 137  K 4.4 3.7 4.5  CL 101 97 99  CO2 25 28 28   GLUCOSE 112* 128* 89  BUN 19 18 20   CREATININE 1.07 1.09 1.19*  CALCIUM 10.8* 10.4 9.9   Liver Function Tests:  Recent Labs Lab 06/11/13 1339 06/12/13 0124  AST 62* 67*  ALT 11 12  ALKPHOS 68 78  BILITOT 0.8 0.5  PROT 6.9 6.6  ALBUMIN 2.7* 2.5*    Recent Labs Lab 06/11/13 1339  LIPASE 58   No results found for this basename: AMMONIA,  in the last 168 hours CBC:  Recent Labs Lab 06/11/13 1324 06/12/13 0124 06/13/13 0514  WBC 2.8* 3.2* 3.4*  NEUTROABS 1.9  --   --   HGB 11.6* 11.3* 11.0*  HCT 33.6* 32.3* 32.1*  MCV 80.8 80.3 80.3  PLT 143* 125* 128*   Cardiac Enzymes:  Recent Labs Lab 06/11/13 1954 06/12/13 0124 06/12/13 0742  TROPONINI <0.30 <0.30 <0.30   BNP: BNP (last 3 results)  Recent Labs  06/11/13 1339  PROBNP 1496.0*   CBG: No results found for this basename: GLUCAP,  in the last 168 hours     Signed:  Ayahna Solazzo A  [facility] 06/13/2013, 11:37 AM

## 2013-06-14 LAB — URINE CULTURE
COLONY COUNT: NO GROWTH
CULTURE: NO GROWTH

## 2013-06-24 ENCOUNTER — Encounter (HOSPITAL_COMMUNITY): Payer: Medicare Other

## 2013-06-24 ENCOUNTER — Encounter (HOSPITAL_BASED_OUTPATIENT_CLINIC_OR_DEPARTMENT_OTHER): Payer: Medicare Other

## 2013-06-24 ENCOUNTER — Encounter (HOSPITAL_COMMUNITY): Payer: Self-pay

## 2013-06-24 VITALS — BP 101/64 | HR 65 | Temp 98.3°F | Resp 16 | Wt 146.4 lb

## 2013-06-24 DIAGNOSIS — N039 Chronic nephritic syndrome with unspecified morphologic changes: Secondary | ICD-10-CM

## 2013-06-24 DIAGNOSIS — K754 Autoimmune hepatitis: Secondary | ICD-10-CM

## 2013-06-24 DIAGNOSIS — I441 Atrioventricular block, second degree: Secondary | ICD-10-CM

## 2013-06-24 DIAGNOSIS — N183 Chronic kidney disease, stage 3 unspecified: Secondary | ICD-10-CM

## 2013-06-24 DIAGNOSIS — Q2730 Arteriovenous malformation, site unspecified: Secondary | ICD-10-CM

## 2013-06-24 DIAGNOSIS — D61818 Other pancytopenia: Secondary | ICD-10-CM

## 2013-06-24 DIAGNOSIS — D649 Anemia, unspecified: Secondary | ICD-10-CM

## 2013-06-24 DIAGNOSIS — D500 Iron deficiency anemia secondary to blood loss (chronic): Secondary | ICD-10-CM

## 2013-06-24 DIAGNOSIS — K7460 Unspecified cirrhosis of liver: Secondary | ICD-10-CM

## 2013-06-24 DIAGNOSIS — K5521 Angiodysplasia of colon with hemorrhage: Secondary | ICD-10-CM

## 2013-06-24 DIAGNOSIS — D631 Anemia in chronic kidney disease: Secondary | ICD-10-CM

## 2013-06-24 DIAGNOSIS — D731 Hypersplenism: Secondary | ICD-10-CM

## 2013-06-24 LAB — CBC
HCT: 33.1 % — ABNORMAL LOW (ref 36.0–46.0)
HEMOGLOBIN: 11.3 g/dL — AB (ref 12.0–15.0)
MCH: 27.2 pg (ref 26.0–34.0)
MCHC: 34.1 g/dL (ref 30.0–36.0)
MCV: 79.6 fL (ref 78.0–100.0)
PLATELETS: 99 10*3/uL — AB (ref 150–400)
RBC: 4.16 MIL/uL (ref 3.87–5.11)
RDW: 17.8 % — ABNORMAL HIGH (ref 11.5–15.5)
WBC: 3.5 10*3/uL — ABNORMAL LOW (ref 4.0–10.5)

## 2013-06-24 LAB — FERRITIN: FERRITIN: 48 ng/mL (ref 10–291)

## 2013-06-24 NOTE — Progress Notes (Signed)
LABS DRAWN FOR CBC FERR  

## 2013-06-24 NOTE — Patient Instructions (Signed)
[facility] Discharge Instructions  RECOMMENDATIONS MADE BY THE CONSULTANT AND ANY TEST RESULTS WILL BE SENT TO YOUR REFERRING PHYSICIAN.  EXAM FINDINGS BY THE PHYSICIAN TODAY AND SIGNS OR SYMPTOMS TO REPORT TO CLINIC OR PRIMARY PHYSICIAN: Exam and findings as discussed by Dr. Zigmund Daniel.  You don't need aranesp today.  Your hemoglobin is 11.3.    Report increased fatigue or increased shortness of breath.  MEDICATIONS PRESCRIBED:  none  INSTRUCTIONS/FOLLOW-UP: Feraheme infusions on 6/24 and 7/1.  Labs and possible aranesp every 3 weeks, office visit in 9 weeks.  Thank you for choosing [facility] to provide your oncology and hematology care.  To afford each patient quality time with our providers, please arrive at least 15 minutes before your scheduled appointment time.  With your help, our goal is to use those 15 minutes to complete the necessary work-up to ensure our physicians have the information they need to help with your evaluation and healthcare recommendations.    Effective January 1st, 2014, we ask that you re-schedule your appointment with our physicians should you arrive 10 or more minutes late for your appointment.  We strive to give you quality time with our providers, and arriving late affects you and other patients whose appointments are after yours.    Again, thank you for choosing [facility].  Our hope is that these requests will decrease the amount of time that you wait before being seen by our physicians.       _____________________________________________________________  Should you have questions after your visit to [facility], please contact our office at [phone] between the hours of 8:30 a.m. and 5:00 p.m.  Voicemails left after 4:30 p.m. will not be returned until the following business day.  For prescription refill requests, have your pharmacy contact our office with your prescription refill request.

## 2013-06-24 NOTE — Progress Notes (Signed)
[place]  OFFICE PROGRESS NOTE  [name], MD [address] [place] [address]  DIAGNOSIS: Iron deficiency anemia secondary to blood loss (chronic)  AVM (congenital arteriovenous malformation)  Hypersplenism  Autoimmune hepatitis  Chief Complaint  Patient presents with  . Mixed anemia    Chronic local blood loss, anemia in chronic kidney disease, ferritin depletion by Aranesp    CURRENT THERAPY: Intermittent iron infusion last on 03/05/2013 with Aranesp 500 mcg given on 06/03/2013 being administered every 3 weeks to maintain hemoglobin close to 11 as possible.  INTERVAL HISTORY: Eileen Santana 80 y.o. female returns for followup of anemia of chronic disease with chronic renal failure and iron deficiency with blood loss secondary to intestinal AV malformations, chronic kidney disease, hypersplenism and also depletion of iron stores by the use of Aranesp. Last ferritin on 05/13/2013 was 70. Recently hospitalized for 2 days with atrial flutter treated medically without long-term anticoagulation plan due to chronic blood loss. She feels well today with no melena, hematochezia, hematuria, epistaxis, hemoptysis, or fatigue. She also denies any PND, orthopnea, or palpitations. Appetite is good with no diarrhea, constipation, incontinence, vaginal bleeding, skin rash, headache, or seizures.  MEDICAL HISTORY: Past Medical History  Diagnosis Date  . History of GI bleed     Associated with NSAIDS  . Iron deficiency anemia     Transfusion dependent  . DJD (degenerative joint disease)   . Essential hypertension, benign   . History of colitis   . Ileitis   . Pancytopenia   . Autoimmune hepatitis     With leukocytoclastic vasculitis  . Heart block AV second degree March 2013  . Bronchitis   . Steroid-induced myopathy   . Renal calculus 08/12/2011  . Rheumatoid arthritis(714.0)   . Colon ulcer 04/2010    NSAID related  . Gastric ulcer  04/2010    NSAID related  . Cancer   . UGI bleed 09/20/2011    Focal area of gastritis oozing of blood  . Gastric AVM 09/20/2011  . Candida esophagitis 09/20/2011  . Paroxysmal atrial fibrillation   . Pericarditis     2D Echo EF 65%-70%  . Chronic kidney disease     Probably stage II to III with a recent acute exacerbation  . Cholestatic hepatitis   . UTI (lower urinary tract infection)     history    INTERIM HISTORY: has Pancytopenia; Sedimentation rate elevation; Elevated liver enzymes; GI bleed; Microcytic anemia; Orthostatic hypotension; Bradycardia; Renal failure; Autoimmune hepatitis; HTN (hypertension), benign; Abdominal pain, acute, generalized; Nausea; Diarrhea; Hypomagnesemia; Hypokalemia; Prolonged Q-T interval on ECG; UTI (urinary tract infection); Gallstones; Thrombocytopenia; Renal calculus; Chronic back pain; Leg pain; Rheumatoid arthritis; Acute renal insufficiency; Hypoalbuminemia; Guaiac positive stools; UGI bleed; Gastric AVM; Candida esophagitis; Anemia; PAT (paroxysmal atrial tachycardia); Hypertrophic cardiomyopathy; Hypersplenism; Iron deficiency anemia secondary to blood loss (chronic); AVM (congenital arteriovenous malformation); Atrial tachycardia; and Atrial flutter on her problem list.    ALLERGIES:  is allergic to iohexol; ivp dye; naprosyn; red blood cells; verapamil; vicodin; and sulfa antibiotics.  MEDICATIONS: has a current medication list which includes the following prescription(s): acetaminophen, azathioprine, vitamin d, coenzyme q10, darbepoetin alfa-albumin, dexlansoprazole, diclofenac sodium, diltiazem, fish oil-omega-3 fatty acids, folic acid, furosemide, magnesium oxide, metoprolol tartrate, multivitamin with minerals, potassium chloride sa, prednisone, ferumoxytol, spironolactone, travoprost (bak free), ursodiol, and vitamin b-12, and the following Facility-Administered Medications: diphenhydramine.  SURGICAL HISTORY:  Past Surgical History  Procedure  Laterality Date  . Colonoscopy  04/2010  . Upper gastrointestinal endoscopy  04/2010  . Esophagogastroduodenoscopy  09/20/2011    Status post APC.  Marland Kitchen Esophagogastroduodenoscopy  09/20/2011    Procedure: ESOPHAGOGASTRODUODENOSCOPY (EGD);  Surgeon: Rogene Houston, MD;  Location: [facility];  Service: Endoscopy;  Laterality: N/A;  . Cyst removal hand      Elbow    FAMILY HISTORY: family history includes Arthritis in her brother; COPD in her brother; Heart disease (age of onset: 63) in her mother; Heart disease (age of onset: 64) in her father; Hypertension in her brother and sister; Osteoporosis in her sister; Stroke in her mother.  SOCIAL HISTORY:  reports that she has been smoking Cigarettes.  She has been smoking about 0.00 packs per day. She has never used smokeless tobacco. She reports that she does not drink alcohol or use illicit drugs.  REVIEW OF SYSTEMS:  Other than that discussed above is noncontributory.  PHYSICAL EXAMINATION: ECOG PERFORMANCE STATUS: 1 - Symptomatic but completely ambulatory  Blood pressure 101/64, pulse 65, temperature 98.3 F (36.8 C), temperature source Oral, resp. rate 16, weight 146 lb 6.4 oz (66.407 kg).  GENERAL:alert, no distress and comfortable SKIN: skin color, texture, turgor are normal, no rashes or significant lesions EYES: PERLA; Conjunctiva are pink and non-injected, sclera clear SINUSES: No redness or tenderness over maxillary or ethmoid sinuses OROPHARYNX:no exudate, no erythema on lips, buccal mucosa, or tongue. NECK: supple, thyroid normal size, non-tender, without nodularity. No masses CHEST: Normal AP diameter with no breast masses. LYMPH:  no palpable lymphadenopathy in the cervical, axillary or inguinal LUNGS: clear to auscultation and percussion with normal breathing effort HEART: regular rate & rhythm and no murmurs. ABDOMEN:abdomen soft, non-tender and normal bowel sounds MUSCULOSKELETAL:no cyanosis of digits and no clubbing. Range  of motion normal.  NEURO: alert & oriented x 3 with fluent speech, no focal motor/sensory deficits   LABORATORY DATA: Lab on 06/24/2013  Component Date Value Ref Range Status  . WBC 06/24/2013 3.5* 4.0 - 10.5 K/uL Final  . RBC 06/24/2013 4.16  3.87 - 5.11 MIL/uL Final  . Hemoglobin 06/24/2013 11.3* 12.0 - 15.0 g/dL Final  . HCT 06/24/2013 33.1* 36.0 - 46.0 % Final  . MCV 06/24/2013 79.6  78.0 - 100.0 fL Final  . MCH 06/24/2013 27.2  26.0 - 34.0 pg Final  . MCHC 06/24/2013 34.1  30.0 - 36.0 g/dL Final  . RDW 06/24/2013 17.8* 11.5 - 15.5 % Final  . Platelets 06/24/2013 99* 150 - 400 K/uL Final   Comment: SPECIMEN CHECKED FOR CLOTS                          PLATELET COUNT CONFIRMED BY SMEAR  Admission on 06/11/2013, Discharged on 06/13/2013  Component Date Value Ref Range Status  . WBC 06/11/2013 2.8* 4.0 - 10.5 K/uL Final  . RBC 06/11/2013 4.16  3.87 - 5.11 MIL/uL Final  . Hemoglobin 06/11/2013 11.6* 12.0 - 15.0 g/dL Final  . HCT 06/11/2013 33.6* 36.0 - 46.0 % Final  . MCV 06/11/2013 80.8  78.0 - 100.0 fL Final  . MCH 06/11/2013 27.9  26.0 - 34.0 pg Final  . MCHC 06/11/2013 34.5  30.0 - 36.0 g/dL Final  . RDW 06/11/2013 17.7* 11.5 - 15.5 % Final  . Platelets 06/11/2013 143* 150 - 400 K/uL Final  . Neutrophils Relative % 06/11/2013 70  43 - 77 % Final  . Lymphocytes Relative 06/11/2013 17  12 -  46 % Final  . Monocytes Relative 06/11/2013 10  3 - 12 % Final  . Eosinophils Relative 06/11/2013 2  0 - 5 % Final  . Basophils Relative 06/11/2013 1  0 - 1 % Final  . Neutro Abs 06/11/2013 1.9  1.7 - 7.7 K/uL Final  . Lymphs Abs 06/11/2013 0.5* 0.7 - 4.0 K/uL Final  . Monocytes Absolute 06/11/2013 0.3  0.1 - 1.0 K/uL Final  . Eosinophils Absolute 06/11/2013 0.1  0.0 - 0.7 K/uL Final  . Basophils Absolute 06/11/2013 0.0  0.0 - 0.1 K/uL Final  . RBC Morphology 06/11/2013 POLYCHROMASIA PRESENT   Final  . WBC Morphology 06/11/2013 WHITE COUNT CONFIRMED ON SMEAR   Final  . Smear Review  06/11/2013 PLATELET COUNT CONFIRMED BY SMEAR   Final   Comment: LARGE PLATELETS PRESENT                          GIANT PLATELETS SEEN  . Sodium 06/11/2013 138  137 - 147 mEq/L Final  . Potassium 06/11/2013 4.4  3.7 - 5.3 mEq/L Final  . Chloride 06/11/2013 101  96 - 112 mEq/L Final  . CO2 06/11/2013 25  19 - 32 mEq/L Final  . Glucose, Bld 06/11/2013 112* 70 - 99 mg/dL Final  . BUN 06/11/2013 19  6 - 23 mg/dL Final  . Creatinine, Ser 06/11/2013 1.07  0.50 - 1.10 mg/dL Final  . Calcium 06/11/2013 10.8* 8.4 - 10.5 mg/dL Final  . Total Protein 06/11/2013 6.9  6.0 - 8.3 g/dL Final  . Albumin 06/11/2013 2.7* 3.5 - 5.2 g/dL Final  . AST 06/11/2013 62* 0 - 37 U/L Final  . ALT 06/11/2013 11  0 - 35 U/L Final  . Alkaline Phosphatase 06/11/2013 68  39 - 117 U/L Final  . Total Bilirubin 06/11/2013 0.8  0.3 - 1.2 mg/dL Final  . GFR calc non Af Amer 06/11/2013 48* >90 mL/min Final  . GFR calc Af Amer 06/11/2013 55* >90 mL/min Final   Comment: (NOTE)                          The eGFR has been calculated using the CKD EPI equation.                          This calculation has not been validated in all clinical situations.                          eGFR's persistently <90 mL/min signify possible Chronic Kidney                          Disease.  . Color, Urine 06/11/2013 YELLOW  YELLOW Final  . APPearance 06/11/2013 CLEAR  CLEAR Final  . Specific Gravity, Urine 06/11/2013 1.015  1.005 - 1.030 Final  . pH 06/11/2013 6.0  5.0 - 8.0 Final  . Glucose, UA 06/11/2013 NEGATIVE  NEGATIVE mg/dL Final  . Hgb urine dipstick 06/11/2013 NEGATIVE  NEGATIVE Final  . Bilirubin Urine 06/11/2013 NEGATIVE  NEGATIVE Final  . Ketones, ur 06/11/2013 NEGATIVE  NEGATIVE mg/dL Final  . Protein, ur 06/11/2013 NEGATIVE  NEGATIVE mg/dL Final  . Urobilinogen, UA 06/11/2013 0.2  0.0 - 1.0 mg/dL Final  . Nitrite 06/11/2013 POSITIVE* NEGATIVE Final  . Leukocytes, UA 06/11/2013 TRACE* NEGATIVE Final  . Lipase 06/11/2013 58  11 - 59  U/L Final  . Pro B Natriuretic peptide (BNP) 06/11/2013 1496.0* 0 - 450 pg/mL Final  . Squamous Epithelial / LPF 06/11/2013 FEW* RARE Final  . WBC, UA 06/11/2013 7-10  <3 WBC/hpf Final  . RBC / HPF 06/11/2013 0-2  <3 RBC/hpf Final  . Bacteria, UA 06/11/2013 MANY* RARE Final  . MRSA by PCR 06/11/2013 NEGATIVE  NEGATIVE Final   Comment:                                 The GeneXpert MRSA Assay (FDA                          approved for NASAL specimens                          only), is one component of a                          comprehensive MRSA colonization                          surveillance program. It is not                          intended to diagnose MRSA                          infection nor to guide or                          monitor treatment for                          MRSA infections.  Marland Kitchen TSH 06/11/2013 1.110  0.350 - 4.500 uIU/mL Final   Performed at [facility]  . Troponin I 06/11/2013 <0.30  <0.30 ng/mL Final   Comment:                                 Due to the release kinetics of cTnI,                          a negative result within the first hours                          of the onset of symptoms does not rule out                          myocardial infarction with certainty.                          If myocardial infarction is still suspected,                          repeat the test at appropriate intervals.  . Troponin I 06/12/2013 <0.30  <0.30 ng/mL Final   Comment:  Due to the release kinetics of cTnI,                          a negative result within the first hours                          of the onset of symptoms does not rule out                          myocardial infarction with certainty.                          If myocardial infarction is still suspected,                          repeat the test at appropriate intervals.  . Troponin I 06/12/2013 <0.30  <0.30 ng/mL Final   Comment:                                  Due to the release kinetics of cTnI,                          a negative result within the first hours                          of the onset of symptoms does not rule out                          myocardial infarction with certainty.                          If myocardial infarction is still suspected,                          repeat the test at appropriate intervals.  . Sodium 06/12/2013 137  137 - 147 mEq/L Final  . Potassium 06/12/2013 3.7  3.7 - 5.3 mEq/L Final  . Chloride 06/12/2013 97  96 - 112 mEq/L Final  . CO2 06/12/2013 28  19 - 32 mEq/L Final  . Glucose, Bld 06/12/2013 128* 70 - 99 mg/dL Final  . BUN 06/12/2013 18  6 - 23 mg/dL Final  . Creatinine, Ser 06/12/2013 1.09  0.50 - 1.10 mg/dL Final  . Calcium 06/12/2013 10.4  8.4 - 10.5 mg/dL Final  . Total Protein 06/12/2013 6.6  6.0 - 8.3 g/dL Final  . Albumin 06/12/2013 2.5* 3.5 - 5.2 g/dL Final  . AST 06/12/2013 67* 0 - 37 U/L Final  . ALT 06/12/2013 12  0 - 35 U/L Final  . Alkaline Phosphatase 06/12/2013 78  39 - 117 U/L Final  . Total Bilirubin 06/12/2013 0.5  0.3 - 1.2 mg/dL Final  . GFR calc non Af Amer 06/12/2013 47* >90 mL/min Final  . GFR calc Af Amer 06/12/2013 54* >90 mL/min Final   Comment: (NOTE)                          The eGFR has been calculated using the CKD EPI equation.  This calculation has not been validated in all clinical situations.                          eGFR's persistently <90 mL/min signify possible Chronic Kidney                          Disease.  . WBC 06/12/2013 3.2* 4.0 - 10.5 K/uL Final  . RBC 06/12/2013 4.02  3.87 - 5.11 MIL/uL Final  . Hemoglobin 06/12/2013 11.3* 12.0 - 15.0 g/dL Final  . HCT 06/12/2013 32.3* 36.0 - 46.0 % Final  . MCV 06/12/2013 80.3  78.0 - 100.0 fL Final  . MCH 06/12/2013 28.1  26.0 - 34.0 pg Final  . MCHC 06/12/2013 35.0  30.0 - 36.0 g/dL Final  . RDW 06/12/2013 17.7* 11.5 - 15.5 % Final  . Platelets 06/12/2013 125* 150 - 400 K/uL Final     Comment: RESULT REPEATED AND VERIFIED                          SPECIMEN CHECKED FOR CLOTS                          PLATELET COUNT CONFIRMED BY SMEAR                          SMEAR STAINED AND AVAILABLE FOR REVIEW  . Calcium, Ion 06/11/2013 1.41* 1.13 - 1.30 mmol/L Final   Performed at [facility]  . Specimen Description 06/12/2013 URINE, CLEAN CATCH   Final  . Special Requests 06/12/2013 NONE   Final  . Culture  Setup Time 06/12/2013    Final                   Value:06/13/2013 00:31                         Performed at [facility]  . Colony Count 06/12/2013    Final                   Value:NO GROWTH                         Performed at [facility]  . Culture 06/12/2013    Final                   Value:NO GROWTH                         Performed at [facility]  . Report Status 06/12/2013 06/14/2013 FINAL   Final  . WBC 06/13/2013 3.4* 4.0 - 10.5 K/uL Final  . RBC 06/13/2013 4.00  3.87 - 5.11 MIL/uL Final  . Hemoglobin 06/13/2013 11.0* 12.0 - 15.0 g/dL Final  . HCT 06/13/2013 32.1* 36.0 - 46.0 % Final  . MCV 06/13/2013 80.3  78.0 - 100.0 fL Final  . MCH 06/13/2013 27.5  26.0 - 34.0 pg Final  . MCHC 06/13/2013 34.3  30.0 - 36.0 g/dL Final  . RDW 06/13/2013 17.8* 11.5 - 15.5 % Final  . Platelets 06/13/2013 128* 150 - 400 K/uL Final   Comment: SPECIMEN CHECKED FOR CLOTS  PLATELET COUNT CONFIRMED BY SMEAR  . Sodium 06/13/2013 137  137 - 147 mEq/L Final  . Potassium 06/13/2013 4.5  3.7 - 5.3 mEq/L Final   DELTA CHECK NOTED  . Chloride 06/13/2013 99  96 - 112 mEq/L Final  . CO2 06/13/2013 28  19 - 32 mEq/L Final  . Glucose, Bld 06/13/2013 89  70 - 99 mg/dL Final  . BUN 06/13/2013 20  6 - 23 mg/dL Final  . Creatinine, Ser 06/13/2013 1.19* 0.50 - 1.10 mg/dL Final  . Calcium 06/13/2013 9.9  8.4 - 10.5 mg/dL Final  . GFR calc non Af Amer 06/13/2013 42* >90 mL/min Final  . GFR calc Af Amer 06/13/2013 49* >90 mL/min Final    Comment: (NOTE)                          The eGFR has been calculated using the CKD EPI equation.                          This calculation has not been validated in all clinical situations.                          eGFR's persistently <90 mL/min signify possible Chronic Kidney                          Disease.  Infusion on 06/03/2013  Component Date Value Ref Range Status  . WBC 06/03/2013 2.4* 4.0 - 10.5 K/uL Final  . RBC 06/03/2013 3.77* 3.87 - 5.11 MIL/uL Final  . Hemoglobin 06/03/2013 10.5* 12.0 - 15.0 g/dL Final  . HCT 06/03/2013 30.9* 36.0 - 46.0 % Final  . MCV 06/03/2013 82.0  78.0 - 100.0 fL Final  . MCH 06/03/2013 27.9  26.0 - 34.0 pg Final  . MCHC 06/03/2013 34.0  30.0 - 36.0 g/dL Final  . RDW 06/03/2013 17.6* 11.5 - 15.5 % Final  . Platelets 06/03/2013 91* 150 - 400 K/uL Final   Comment: SPECIMEN CHECKED FOR CLOTS                          PLATELET COUNT CONFIRMED BY SMEAR    PATHOLOGY: No new pathology.  Urinalysis    Component Value Date/Time   COLORURINE YELLOW 06/11/2013 1356   APPEARANCEUR CLEAR 06/11/2013 1356   LABSPEC 1.015 06/11/2013 1356   PHURINE 6.0 06/11/2013 1356   GLUCOSEU NEGATIVE 06/11/2013 1356   HGBUR NEGATIVE 06/11/2013 [place] 06/11/2013 1356   KETONESUR NEGATIVE 06/11/2013 1356   PROTEINUR NEGATIVE 06/11/2013 1356   UROBILINOGEN 0.2 06/11/2013 1356   NITRITE POSITIVE* 06/11/2013 1356   LEUKOCYTESUR TRACE* 06/11/2013 1356    RADIOGRAPHIC STUDIES: Dg Chest 2 View  06/11/2013   CLINICAL DATA:  Irregular heartbeat  EXAM: CHEST  2 VIEW  COMPARISON:  Portable chest x-ray dated August 10, 2011  FINDINGS: The lungs are mildly hyperinflated. The cardiopericardial silhouette is enlarged but stable. The pulmonary vascularity is not engorged. There is mild tortuosity of the descending thoracic aorta. There is no pleural effusion. The bony thorax is unremarkable.  IMPRESSION: 1. There is mild cardiomegaly without evidence of CHF. 2. There is mild hyperinflation  consistent with COPD.   Electronically Signed   By: David  Martinique   On: 06/11/2013 13:55    ASSESSMENT:  #1. Mixed anemia  with contributions from chronic kidney disease, chronic blood loss due to AVMs, and hypersplenism with bone marrow done in July of 2014 showing no evidence of myelodysplasia or malignancy but with absent iron stores.  #2. 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  #3. Chronic kidney disease, stage III.  #4. Second-degree heart block with recent episode of atrial flutter, treated medically with no intent to use anticoagulants in the future.    PLAN:  #1. Feraheme 510 mg IV on 06/26/2013 and 07/03/2013 to replenish  iron stores. #2. Aranesp will be held today since hemoglobin is over 11. Repeat value in 3 weeks and if hemoglobin is less than eleven, 500 mcg of Aranesp will be administered. #3. Followup in 9 weeks with CBC and ferritin   All questions were answered. The patient knows to call the clinic with any problems, questions or concerns. We can certainly see the patient much sooner if necessary.   I spent 25 minutes counseling the patient face to face. The total time spent in the appointment was 30 minutes.    Doroteo Bradford, MD 06/24/2013 10:58 AM  DISCLAIMER:  This note was dictated with voice recognition software.  Similar sounding words can inadvertently be transcribed inaccurately and may not be corrected upon review.

## 2013-06-25 LAB — COMPREHENSIVE METABOLIC PANEL
ALT: 13 U/L (ref 0–35)
AST: 66 U/L — ABNORMAL HIGH (ref 0–37)
Albumin: 3.1 g/dL — ABNORMAL LOW (ref 3.5–5.2)
Alkaline Phosphatase: 53 U/L (ref 39–117)
BUN: 17 mg/dL (ref 6–23)
CALCIUM: 10.6 mg/dL — AB (ref 8.4–10.5)
CHLORIDE: 103 meq/L (ref 96–112)
CO2: 26 meq/L (ref 19–32)
Creat: 0.99 mg/dL (ref 0.50–1.10)
Glucose, Bld: 110 mg/dL — ABNORMAL HIGH (ref 70–99)
Potassium: 4.4 mEq/L (ref 3.5–5.3)
SODIUM: 135 meq/L (ref 135–145)
TOTAL PROTEIN: 6.9 g/dL (ref 6.0–8.3)
Total Bilirubin: 1 mg/dL (ref 0.2–1.2)

## 2013-06-26 ENCOUNTER — Encounter (HOSPITAL_BASED_OUTPATIENT_CLINIC_OR_DEPARTMENT_OTHER): Payer: Medicare Other

## 2013-06-26 VITALS — BP 103/48 | HR 61 | Temp 98.2°F | Resp 16

## 2013-06-26 DIAGNOSIS — D509 Iron deficiency anemia, unspecified: Secondary | ICD-10-CM

## 2013-06-26 DIAGNOSIS — D500 Iron deficiency anemia secondary to blood loss (chronic): Secondary | ICD-10-CM

## 2013-06-26 DIAGNOSIS — Q279 Congenital malformation of peripheral vascular system, unspecified: Secondary | ICD-10-CM

## 2013-06-26 MED ORDER — SODIUM CHLORIDE 0.9 % IJ SOLN
10.0000 mL | INTRAMUSCULAR | Status: DC | PRN
  Administered 2013-06-26: 10 mL via INTRAVENOUS

## 2013-06-26 MED ORDER — SODIUM CHLORIDE 0.9 % IV SOLN
510.0000 mg | Freq: Once | INTRAVENOUS | Status: AC
  Administered 2013-06-26: 510 mg via INTRAVENOUS
  Filled 2013-06-26: qty 17

## 2013-06-26 MED ORDER — SODIUM CHLORIDE 0.9 % IV SOLN
INTRAVENOUS | Status: DC
  Administered 2013-06-26: 11:00:00 via INTRAVENOUS

## 2013-06-26 NOTE — Progress Notes (Signed)
Tolerated well

## 2013-06-26 NOTE — Progress Notes (Signed)
HPI: Eileen Santana is a-year-old patient normally followed Dr. Jomarie Longs, we are fine for ongoing assessment management of atrial fibrillation, hypertrophic heart myopathy and hypertension. She was last seen in May of 2014 and [facility] office.   But has had a recent admission to [facility] in the setting of tachycardia, atrial fib flutter with rapid ventricular response. She was asymptomatic. Heart rate had increased to the 130s to 140. Started on diltiazem drip and admitted to ICU. After infusion of Cardizem drip she was switched to oral Cardizem and metoprolol was increased to 25 mg twice a day. Diltiazem was continued 120 mg daily. Pressure is well-controlled. She is found to have a UTI and did receive Rocephin and placed on Ceftin for 3 more days.   Echocardiogram was completed revealing EF of 70% with severe LVH, but this was just on review of a 2013 echo she will need a followup. The patient was not placed on anticoagulation therapy, due to history of blood loss anemia requiring transfusion by history.  She comes without complaints. She is medically complaint and has no complaints of rapid palpitations or dizziness. She was asymptomatic with rapid HR in the past.    Allergies  Allergen Reactions  . Iohexol Swelling    IV Dye   . Ivp Dye [Iodinated Diagnostic Agents] Swelling    Hives  . Naprosyn [Naproxen] Hives and Itching  . Red Blood Cells Swelling and Dermatitis    With blood transfusion 2012  . Verapamil     Heart block (2nd degree)  . Vicodin [Hydrocodone-Acetaminophen] Hives and Itching  . Sulfa Antibiotics Itching and Rash    Current Outpatient Prescriptions  Medication Sig Dispense Refill  . acetaminophen (TYLENOL) 325 MG tablet Take 650 mg by mouth every 6 (six) hours as needed. For pain      . azaTHIOprine (IMURAN) 50 MG tablet TAKE 1 & 1/2 TABLETS ONCE DAILY.  45 tablet  5  . Cholecalciferol (VITAMIN D) 2000 UNITS tablet Take 2,000 Units by mouth daily.        . Coenzyme Q10 200 MG capsule Take 200 mg by mouth daily.      Marland Kitchen dexlansoprazole (DEXILANT) 60 MG capsule Take 1 capsule (60 mg total) by mouth every other day.  45 capsule  3  . diclofenac sodium (VOLTAREN) 1 % GEL Apply 1 application topically daily as needed (for pain). Apply to back and legs      . diltiazem (CARDIZEM CD) 120 MG 24 hr capsule Take 1 capsule (120 mg total) by mouth daily.  30 capsule  0  . fish oil-omega-3 fatty acids 1000 MG capsule Take 1 g by mouth daily.      . folic acid (FOLVITE) 800 MCG tablet Take 800 mcg by mouth daily.       . furosemide (LASIX) 20 MG tablet Take 20 mg by mouth daily as needed. For fluid retention      . magnesium oxide (MAG-OX) 400 MG tablet Take 400 mg by mouth 4 (four) times a week. Takes Monday Wednesday Friday and Saturday      . metoprolol tartrate (LOPRESSOR) 25 MG tablet Take 1 tablet (25 mg total) by mouth 2 (two) times daily.  60 tablet  0  . Multiple Vitamins-Minerals (MULTIVITAMIN WITH MINERALS) tablet Take 1 tablet by mouth daily.        . potassium chloride SA (K-DUR,KLOR-CON) 20 MEQ tablet Take 20 mEq by mouth See admin instructions. Takes 5 times weekly on Monday,  Wednesday, Friday, Saturday and Sunday      . predniSONE (DELTASONE) 5 MG tablet TAKE 1&1/2 TABLET BY MOUTH EVERY MORNING  45 tablet  5  . sodium chloride 0.9 % SOLN 100 mL with ferumoxytol 510 MG/17ML SOLN 1,020 mg Inject 1,020 mg into the vein as needed.       Marland Kitchen spironolactone (ALDACTONE) 50 MG tablet Take 50 mg by mouth daily.       . Travoprost, BAK Free, (TRAVATAN) 0.004 % SOLN ophthalmic solution Place 1 drop into both eyes at bedtime.      . ursodiol (ACTIGALL) 300 MG capsule Take 300 mg by mouth 2 (two) times daily.       . vitamin B-12 (CYANOCOBALAMIN) 1000 MCG tablet Take 1,000 mcg by mouth daily.      . Darbepoetin Alfa-Albumin (ARANESP IJ) Inject as directed. PRN       No current facility-administered medications for this visit.   Facility-Administered  Medications Ordered in Other Visits  Medication Dose Route Frequency Steele Stracener Last Rate Last Dose  . diphenhydrAMINE (BENADRYL) capsule 25 mg  25 mg Oral Q6H PRN Randall An, MD   25 mg at 05/13/11 5366    Past Medical History  Diagnosis Date  . History of GI bleed     Associated with NSAIDS  . Iron deficiency anemia     Transfusion dependent  . DJD (degenerative joint disease)   . Essential hypertension, benign   . History of colitis   . Ileitis   . Pancytopenia   . Autoimmune hepatitis     With leukocytoclastic vasculitis  . Heart block AV second degree March 2013  . Bronchitis   . Steroid-induced myopathy   . Renal calculus 08/12/2011  . Rheumatoid arthritis(714.0)   . Colon ulcer 04/2010    NSAID related  . Gastric ulcer 04/2010    NSAID related  . Cancer   . UGI bleed 09/20/2011    Focal area of gastritis oozing of blood  . Gastric AVM 09/20/2011  . Candida esophagitis 09/20/2011  . Paroxysmal atrial fibrillation   . Pericarditis     2D Echo EF 65%-70%  . Chronic kidney disease     Probably stage II to III with a recent acute exacerbation  . Cholestatic hepatitis   . UTI (lower urinary tract infection)     history    Past Surgical History  Procedure Laterality Date  . Colonoscopy  04/2010  . Upper gastrointestinal endoscopy  04/2010  . Esophagogastroduodenoscopy  09/20/2011    Status post APC.  Marland Kitchen Esophagogastroduodenoscopy  09/20/2011    Procedure: ESOPHAGOGASTRODUODENOSCOPY (EGD);  Surgeon: Malissa Hippo, MD;  Location: [facility] SU[facility];  Service: Endoscopy;  Laterality: N/A;  . Cyst removal hand      Elbow    ROS: Review of systems complete and found to be negative unless listed above  PHYSICAL EXAM BP 130/70  Pulse 66  Ht 5\' 5"  (1.651 m)  Wt 146 lb (66.225 kg)  BMI 24.30 kg/m2  SpO2 98% General: Well developed, well nourished, in no acute distress Head: Eyes PERRLA, No xanthomas.   Normal cephalic and atramatic  Lungs: Clear bilaterally to  auscultation and percussion. Heart: HRRR S1 S2, without MRG.  Pulses are 2+ & equal.            No carotid bruit. No JVD.  No abdominal bruits. No femoral bruits. Abdomen: Bowel sounds are positive, abdomen soft and non-tender without masses or  Hernia's noted. Msk:  Back normal, normal gait. Normal strength and tone for age. Extremities: No clubbing, cyanosis or edema.  DP +1 Neuro: Alert and oriented X 3. Psych:  Good affect, responds appropriately    ASSESSMENT AND PLAN

## 2013-06-27 ENCOUNTER — Encounter: Payer: Self-pay | Admitting: Adult Health

## 2013-06-27 ENCOUNTER — Ambulatory Visit (INDEPENDENT_AMBULATORY_CARE_PROVIDER_SITE_OTHER): Payer: Medicare Other | Admitting: Adult Health

## 2013-06-27 VITALS — BP 130/70 | HR 66 | Ht 65.0 in | Wt 146.0 lb

## 2013-06-27 DIAGNOSIS — I422 Other hypertrophic cardiomyopathy: Secondary | ICD-10-CM

## 2013-06-27 DIAGNOSIS — I471 Supraventricular tachycardia: Secondary | ICD-10-CM

## 2013-06-27 DIAGNOSIS — I498 Other specified cardiac arrhythmias: Secondary | ICD-10-CM

## 2013-06-27 DIAGNOSIS — I10 Essential (primary) hypertension: Secondary | ICD-10-CM

## 2013-06-27 MED ORDER — METOPROLOL TARTRATE 25 MG PO TABS: 25.0000 mg | ORAL_TABLET | Freq: Two times a day (BID) | ORAL | Status: DC

## 2013-06-27 MED ORDER — DILTIAZEM HCL ER COATED BEADS 120 MG PO CP24: 120.0000 mg | ORAL_CAPSULE | Freq: Every day | ORAL | Status: DC

## 2013-06-27 NOTE — Patient Instructions (Signed)
Your physician recommends that you schedule a follow-up appointment in: 6 months with Dr. Purvis Sheffield or Dr Wyline Mood.  Your physician recommends that you continue on your current medications as directed. Please refer to the Current Medication list given to you today.  Thank you for choosing [facility]!!

## 2013-06-27 NOTE — Assessment & Plan Note (Signed)
Heart rate is well controlled. She is not a candidate for anticoagulation due to AVM and GIB. She will continue Cardiazem and metoprolol. 90- day supply is prescribed.

## 2013-06-27 NOTE — Assessment & Plan Note (Signed)
Doing well. She is compliant and symptom free. She will come back in 6 months with no changes in her medication regimen.

## 2013-06-27 NOTE — Assessment & Plan Note (Signed)
Follow up echo will be completed in a year or so for ongoing assessment after longterm medication regimen.

## 2013-06-27 NOTE — Progress Notes (Deleted)
Name: Eileen Santana    DOB: 10-11-1930  Age: 80 y.o.  MR#: [id number]       PCP:  Evlyn CourierHILL,GERALD K, MD      Insurance: Payor: MEDICARE / Plan: MEDICARE PART A AND B / Product Type: *No Product type* /   CC:    Chief Complaint  Patient presents with  . Atrial Fibrillation  . Hypertension    VS Filed Vitals:   06/27/13 1301  BP: 130/70  Pulse: 66  Height: 5\' 5"  (1.651 m)  Weight: 146 lb (66.225 kg)  SpO2: 98%    Weights Current Weight  06/27/13 146 lb (66.225 kg)  06/24/13 146 lb 6.4 oz (66.407 kg)  06/12/13 143 lb 1.3 oz (64.9 kg)    Blood Pressure  BP Readings from Last 3 Encounters:  06/27/13 130/70  06/26/13 103/48  06/24/13 101/64     Admit date:  (Not on file) Last encounter with RMR:  Visit date not found   Allergy Iohexol; Ivp dye; Naprosyn; Red blood cells; Verapamil; Vicodin; and Sulfa antibiotics  Current Outpatient Prescriptions  Medication Sig Dispense Refill  . acetaminophen (TYLENOL) 325 MG tablet Take 650 mg by mouth every 6 (six) hours as needed. For pain      . azaTHIOprine (IMURAN) 50 MG tablet TAKE 1 & 1/2 TABLETS ONCE DAILY.  45 tablet  5  . Cholecalciferol (VITAMIN D) 2000 UNITS tablet Take 2,000 Units by mouth daily.      . Coenzyme Q10 200 MG capsule Take 200 mg by mouth daily.      Marland Kitchen. dexlansoprazole (DEXILANT) 60 MG capsule Take 1 capsule (60 mg total) by mouth every other day.  45 capsule  3  . diclofenac sodium (VOLTAREN) 1 % GEL Apply 1 application topically daily as needed (for pain). Apply to back and legs      . diltiazem (CARDIZEM CD) 120 MG 24 hr capsule Take 1 capsule (120 mg total) by mouth daily.  30 capsule  0  . fish oil-omega-3 fatty acids 1000 MG capsule Take 1 g by mouth daily.      . folic acid (FOLVITE) 800 MCG tablet Take 800 mcg by mouth daily.       . furosemide (LASIX) 20 MG tablet Take 20 mg by mouth daily as needed. For fluid retention      . magnesium oxide (MAG-OX) 400 MG tablet Take 400 mg by mouth 4 (four) times a week.  Takes Monday Wednesday Friday and Saturday      . metoprolol tartrate (LOPRESSOR) 25 MG tablet Take 1 tablet (25 mg total) by mouth 2 (two) times daily.  60 tablet  0  . Multiple Vitamins-Minerals (MULTIVITAMIN WITH MINERALS) tablet Take 1 tablet by mouth daily.        . potassium chloride SA (K-DUR,KLOR-CON) 20 MEQ tablet Take 20 mEq by mouth See admin instructions. Takes 5 times weekly on Monday, Wednesday, Friday, Saturday and Sunday      . predniSONE (DELTASONE) 5 MG tablet TAKE 1&1/2 TABLET BY MOUTH EVERY MORNING  45 tablet  5  . sodium chloride 0.9 % SOLN 100 mL with ferumoxytol 510 MG/17ML SOLN 1,020 mg Inject 1,020 mg into the vein as needed.       Marland Kitchen. spironolactone (ALDACTONE) 50 MG tablet Take 50 mg by mouth daily.       . Travoprost, BAK Free, (TRAVATAN) 0.004 % SOLN ophthalmic solution Place 1 drop into both eyes at bedtime.      . ursodiol (ACTIGALL)  300 MG capsule Take 300 mg by mouth 2 (two) times daily.       . vitamin B-12 (CYANOCOBALAMIN) 1000 MCG tablet Take 1,000 mcg by mouth daily.      . Darbepoetin Alfa-Albumin (ARANESP IJ) Inject as directed. PRN       No current facility-administered medications for this visit.   Facility-Administered Medications Ordered in Other Visits  Medication Dose Route Frequency Provider Last Rate Last Dose  . diphenhydrAMINE (BENADRYL) capsule 25 mg  25 mg Oral Q6H PRN Randall An, MD   25 mg at 05/13/11 0086    Discontinued Meds:   There are no discontinued medications.  Patient Active Problem List   Diagnosis Date Noted  . Atrial flutter 06/12/2013  . Atrial tachycardia 06/11/2013  . AVM (congenital arteriovenous malformation) 10/12/2012  . Hypersplenism 09/21/2012  . Iron deficiency anemia secondary to blood loss (chronic) 09/21/2012  . PAT (paroxysmal atrial tachycardia) 05/23/2012  . Hypertrophic cardiomyopathy 05/23/2012  . Anemia 11/25/2011  . UGI bleed 09/20/2011  . Gastric AVM 09/20/2011  . Candida esophagitis 09/20/2011   . Chronic back pain 09/18/2011  . Leg pain 09/18/2011  . Rheumatoid arthritis 09/18/2011  . Acute renal insufficiency 09/18/2011  . Hypoalbuminemia 09/18/2011  . Guaiac positive stools 09/18/2011  . Renal calculus 08/12/2011  . Abdominal pain, acute, generalized 08/10/2011  . Nausea 08/10/2011  . Diarrhea 08/10/2011  . Hypomagnesemia 08/10/2011  . Hypokalemia 08/10/2011  . Prolonged Q-T interval on ECG 08/10/2011  . UTI (urinary tract infection) 08/10/2011  . Gallstones 08/10/2011  . Thrombocytopenia 08/10/2011  . Orthostatic hypotension 03/05/2011  . Bradycardia 03/05/2011  . Renal failure 03/05/2011  . Autoimmune hepatitis 03/05/2011  . HTN (hypertension), benign 03/05/2011  . Elevated liver enzymes 02/17/2011  . GI bleed 02/17/2011  . Microcytic anemia 02/17/2011  . Pancytopenia 09/03/2010  . Sedimentation rate elevation 09/03/2010    LABS    Component Value Date/Time   NA 137 06/13/2013 0514   NA 137 06/12/2013 0124   NA 138 06/11/2013 1339   K 4.5 06/13/2013 0514   K 3.7 06/12/2013 0124   K 4.4 06/11/2013 1339   CL 99 06/13/2013 0514   CL 97 06/12/2013 0124   CL 101 06/11/2013 1339   CO2 28 06/13/2013 0514   CO2 28 06/12/2013 0124   CO2 25 06/11/2013 1339   GLUCOSE 89 06/13/2013 0514   GLUCOSE 128* 06/12/2013 0124   GLUCOSE 112* 06/11/2013 1339   BUN 20 06/13/2013 0514   BUN 18 06/12/2013 0124   BUN 19 06/11/2013 1339   CREATININE 1.19* 06/13/2013 0514   CREATININE 1.09 06/12/2013 0124   CREATININE 1.07 06/11/2013 1339   CREATININE 0.96 02/11/2013 1135   CREATININE 0.97 10/11/2012 1015   CREATININE 0.96 05/04/2011 1606   CALCIUM 9.9 06/13/2013 0514   CALCIUM 10.4 06/12/2013 0124   CALCIUM 10.8* 06/11/2013 1339   GFRNONAA 42* 06/13/2013 0514   GFRNONAA 47* 06/12/2013 0124   GFRNONAA 48* 06/11/2013 1339   GFRAA 49* 06/13/2013 0514   GFRAA 54* 06/12/2013 0124   GFRAA 55* 06/11/2013 1339   CMP     Component Value Date/Time   NA 137 06/13/2013 0514   K 4.5 06/13/2013 0514   CL 99 06/13/2013  0514   CO2 28 06/13/2013 0514   GLUCOSE 89 06/13/2013 0514   BUN 20 06/13/2013 0514   CREATININE 1.19* 06/13/2013 0514   CREATININE 0.96 02/11/2013 1135   CALCIUM 9.9 06/13/2013 0514   PROT 6.6 06/12/2013 0124  ALBUMIN 2.5* 06/12/2013 0124   AST 67* 06/12/2013 0124   ALT 12 06/12/2013 0124   ALKPHOS 78 06/12/2013 0124   BILITOT 0.5 06/12/2013 0124   GFRNONAA 42* 06/13/2013 0514   GFRAA 49* 06/13/2013 0514       Component Value Date/Time   WBC 3.5* 06/24/2013 1005   WBC 3.4* 06/13/2013 0514   WBC 3.2* 06/12/2013 0124   HGB 11.3* 06/24/2013 1005   HGB 11.0* 06/13/2013 0514   HGB 11.3* 06/12/2013 0124   HCT 33.1* 06/24/2013 1005   HCT 32.1* 06/13/2013 0514   HCT 32.3* 06/12/2013 0124   MCV 79.6 06/24/2013 1005   MCV 80.3 06/13/2013 0514   MCV 80.3 06/12/2013 0124    Lipid Panel     Component Value Date/Time   CHOL 196 03/06/2011 0410   TRIG 41 03/06/2011 0410   HDL 68 03/06/2011 0410   CHOLHDL 2.9 03/06/2011 0410   VLDL 8 03/06/2011 0410   LDLCALC 120* 03/06/2011 0410    ABG    Component Value Date/Time   TCO2 26 11/28/2009 2359     Lab Results  Component Value Date   TSH 1.110 06/11/2013   BNP (last 3 results)  Recent Labs  06/11/13 1339  PROBNP 1496.0*   Cardiac Panel (last 3 results) No results found for this basename: CKTOTAL, CKMB, TROPONINI, RELINDX,  in the last 72 hours  Iron/TIBC/Ferritin    Component Value Date/Time   IRON 17* 12/21/2012 1039   TIBC 334 12/21/2012 1039   FERRITIN 48 06/24/2013 1005     EKG Orders placed during the hospital encounter of 06/11/13  . EKG 12-LEAD  . EKG 12-LEAD  . EKG     Prior Assessment and Plan Problem List as of 06/27/2013     Cardiovascular and Mediastinum   Orthostatic hypotension   HTN (hypertension), benign   Last Assessment & Plan   05/23/2012 Office Visit Written 05/23/2012  5:01 PM by Thurmon Fair, MD     controlled    Prolonged Q-T interval on ECG   Gastric AVM   PAT (paroxysmal atrial tachycardia)   Last Assessment &  Plan   05/23/2012 Office Visit Edited 05/23/2012  5:00 PM by Thurmon Fair, MD     Irregular rhythm due to frequent PACs, at time blocked, creating appearance of second degree heart block when she was on Verapamil. She is unaware of palpitations. No convincing evidence of atrial fibrillation is documented. Today on ECG there is a smooth transition from an ectopic atrial rhythm with "spiky" P waves, upright in V1 to fused P waves and then sinus Pwaves. As the arrhythmia is asymptomatic, there is no firm documentation of AFib and she has had severe (transfusion-requiring) blood loss anemia, recommend staying off ASA and warfarin. She is tolerating the low dose metoprolol well.    Hypertrophic cardiomyopathy   Last Assessment & Plan   05/23/2012 Office Visit Written 05/23/2012  5:01 PM by Thurmon Fair, MD     Nonobstructive; out of proportion to HTN. No family history of sudden death/arrhythmia. No clinical HF.    AVM (congenital arteriovenous malformation)   Atrial tachycardia   Atrial flutter     Digestive   GI bleed   Autoimmune hepatitis   Gallstones   UGI bleed   Candida esophagitis     Musculoskeletal and Integument   Rheumatoid arthritis     Genitourinary   Renal failure   UTI (urinary tract infection)   Renal calculus   Acute renal insufficiency  Hematopoietic and Hemostatic   Pancytopenia     Other   Chronic back pain   Sedimentation rate elevation   Elevated liver enzymes   Microcytic anemia   Bradycardia   Abdominal pain, acute, generalized   Nausea   Diarrhea   Hypomagnesemia   Hypokalemia   Thrombocytopenia   Leg pain   Hypoalbuminemia   Guaiac positive stools   Anemia   Hypersplenism   Iron deficiency anemia secondary to blood loss (chronic)       Imaging: Dg Chest 2 View  06/11/2013   CLINICAL DATA:  Irregular heartbeat  EXAM: CHEST  2 VIEW  COMPARISON:  Portable chest x-ray dated August 10, 2011  FINDINGS: The lungs are mildly hyperinflated. The  cardiopericardial silhouette is enlarged but stable. The pulmonary vascularity is not engorged. There is mild tortuosity of the descending thoracic aorta. There is no pleural effusion. The bony thorax is unremarkable.  IMPRESSION: 1. There is mild cardiomegaly without evidence of CHF. 2. There is mild hyperinflation consistent with COPD.   Electronically Signed   By: David  [place]   On: 06/11/2013 13:55

## 2013-06-28 ENCOUNTER — Other Ambulatory Visit (INDEPENDENT_AMBULATORY_CARE_PROVIDER_SITE_OTHER): Payer: Self-pay | Admitting: Internal Medicine

## 2013-06-28 DIAGNOSIS — D509 Iron deficiency anemia, unspecified: Secondary | ICD-10-CM

## 2013-06-28 DIAGNOSIS — K754 Autoimmune hepatitis: Secondary | ICD-10-CM

## 2013-07-01 ENCOUNTER — Other Ambulatory Visit (INDEPENDENT_AMBULATORY_CARE_PROVIDER_SITE_OTHER): Payer: Self-pay | Admitting: Internal Medicine

## 2013-07-01 ENCOUNTER — Other Ambulatory Visit (INDEPENDENT_AMBULATORY_CARE_PROVIDER_SITE_OTHER): Payer: Self-pay | Admitting: *Deleted

## 2013-07-01 ENCOUNTER — Encounter (INDEPENDENT_AMBULATORY_CARE_PROVIDER_SITE_OTHER): Payer: Self-pay | Admitting: *Deleted

## 2013-07-01 DIAGNOSIS — K754 Autoimmune hepatitis: Secondary | ICD-10-CM

## 2013-07-01 DIAGNOSIS — D509 Iron deficiency anemia, unspecified: Secondary | ICD-10-CM

## 2013-07-03 ENCOUNTER — Encounter (HOSPITAL_COMMUNITY): Payer: Medicare Other | Attending: Oncology

## 2013-07-03 VITALS — BP 102/49 | HR 61 | Temp 98.1°F | Resp 16

## 2013-07-03 DIAGNOSIS — N183 Chronic kidney disease, stage 3 unspecified: Secondary | ICD-10-CM

## 2013-07-03 DIAGNOSIS — N039 Chronic nephritic syndrome with unspecified morphologic changes: Secondary | ICD-10-CM

## 2013-07-03 DIAGNOSIS — D649 Anemia, unspecified: Secondary | ICD-10-CM | POA: Insufficient documentation

## 2013-07-03 DIAGNOSIS — D631 Anemia in chronic kidney disease: Secondary | ICD-10-CM

## 2013-07-03 DIAGNOSIS — D509 Iron deficiency anemia, unspecified: Secondary | ICD-10-CM | POA: Insufficient documentation

## 2013-07-03 MED ORDER — SODIUM CHLORIDE 0.9 % IV SOLN
510.0000 mg | Freq: Once | INTRAVENOUS | Status: AC
  Administered 2013-07-03: 510 mg via INTRAVENOUS
  Filled 2013-07-03: qty 17

## 2013-07-03 MED ORDER — SODIUM CHLORIDE 0.9 % IV SOLN
Freq: Once | INTRAVENOUS | Status: AC
  Administered 2013-07-03: 11:00:00 via INTRAVENOUS

## 2013-07-03 MED ORDER — SODIUM CHLORIDE 0.9 % IJ SOLN
10.0000 mL | INTRAMUSCULAR | Status: DC | PRN
  Administered 2013-07-03: 10 mL

## 2013-07-03 NOTE — Progress Notes (Signed)
Tolerated well

## 2013-07-15 ENCOUNTER — Encounter (HOSPITAL_COMMUNITY): Payer: Medicare Other

## 2013-07-15 ENCOUNTER — Encounter (HOSPITAL_BASED_OUTPATIENT_CLINIC_OR_DEPARTMENT_OTHER): Payer: Medicare Other

## 2013-07-15 DIAGNOSIS — N039 Chronic nephritic syndrome with unspecified morphologic changes: Secondary | ICD-10-CM

## 2013-07-15 DIAGNOSIS — D649 Anemia, unspecified: Secondary | ICD-10-CM

## 2013-07-15 DIAGNOSIS — D509 Iron deficiency anemia, unspecified: Secondary | ICD-10-CM | POA: Diagnosis present

## 2013-07-15 DIAGNOSIS — N183 Chronic kidney disease, stage 3 unspecified: Secondary | ICD-10-CM

## 2013-07-15 DIAGNOSIS — D631 Anemia in chronic kidney disease: Secondary | ICD-10-CM

## 2013-07-15 DIAGNOSIS — K5521 Angiodysplasia of colon with hemorrhage: Secondary | ICD-10-CM

## 2013-07-15 DIAGNOSIS — D6489 Other specified anemias: Secondary | ICD-10-CM

## 2013-07-15 DIAGNOSIS — D500 Iron deficiency anemia secondary to blood loss (chronic): Secondary | ICD-10-CM

## 2013-07-15 LAB — CBC WITH DIFFERENTIAL/PLATELET
BASOS PCT: 1 % (ref 0–1)
Basophils Absolute: 0 10*3/uL (ref 0.0–0.1)
Eosinophils Absolute: 0.1 10*3/uL (ref 0.0–0.7)
Eosinophils Relative: 4 % (ref 0–5)
HEMATOCRIT: 33.3 % — AB (ref 36.0–46.0)
Hemoglobin: 11.6 g/dL — ABNORMAL LOW (ref 12.0–15.0)
LYMPHS PCT: 22 % (ref 12–46)
Lymphs Abs: 0.8 10*3/uL (ref 0.7–4.0)
MCH: 29.3 pg (ref 26.0–34.0)
MCHC: 34.8 g/dL (ref 30.0–36.0)
MCV: 84.1 fL (ref 78.0–100.0)
Monocytes Absolute: 0.4 10*3/uL (ref 0.1–1.0)
Monocytes Relative: 12 % (ref 3–12)
NEUTROS ABS: 2.3 10*3/uL (ref 1.7–7.7)
Neutrophils Relative %: 61 % (ref 43–77)
Platelets: 76 10*3/uL — ABNORMAL LOW (ref 150–400)
RBC: 3.96 MIL/uL (ref 3.87–5.11)
RDW: 23 % — ABNORMAL HIGH (ref 11.5–15.5)
WBC: 3.7 10*3/uL — AB (ref 4.0–10.5)

## 2013-07-15 LAB — CBC
HCT: 32.7 % — ABNORMAL LOW (ref 36.0–46.0)
Hemoglobin: 11.3 g/dL — ABNORMAL LOW (ref 12.0–15.0)
MCH: 29 pg (ref 26.0–34.0)
MCHC: 34.6 g/dL (ref 30.0–36.0)
MCV: 83.8 fL (ref 78.0–100.0)
PLATELETS: 73 10*3/uL — AB (ref 150–400)
RBC: 3.9 MIL/uL (ref 3.87–5.11)
RDW: 23 % — ABNORMAL HIGH (ref 11.5–15.5)
WBC: 3.6 10*3/uL — AB (ref 4.0–10.5)

## 2013-07-15 LAB — MAGNESIUM: MAGNESIUM: 1.6 mg/dL (ref 1.5–2.5)

## 2013-07-15 NOTE — Progress Notes (Signed)
LABS DRAWN FOR CBC 

## 2013-07-15 NOTE — Progress Notes (Signed)
Hemoglobin 11.6. Aranesp not given, treatment parameters not met. RTC in 21 days.

## 2013-07-17 ENCOUNTER — Telehealth (INDEPENDENT_AMBULATORY_CARE_PROVIDER_SITE_OTHER): Payer: Self-pay | Admitting: *Deleted

## 2013-07-17 DIAGNOSIS — K754 Autoimmune hepatitis: Secondary | ICD-10-CM

## 2013-07-17 DIAGNOSIS — D509 Iron deficiency anemia, unspecified: Secondary | ICD-10-CM

## 2013-07-17 NOTE — Telephone Encounter (Signed)
Per Dr.Rehman the patient will need to have labs drawn in 4 weeks.  Labs are noted for August 12,2015.

## 2013-07-24 ENCOUNTER — Other Ambulatory Visit (INDEPENDENT_AMBULATORY_CARE_PROVIDER_SITE_OTHER): Payer: Self-pay | Admitting: *Deleted

## 2013-07-24 ENCOUNTER — Other Ambulatory Visit (INDEPENDENT_AMBULATORY_CARE_PROVIDER_SITE_OTHER): Payer: Self-pay | Admitting: Internal Medicine

## 2013-07-24 ENCOUNTER — Encounter (INDEPENDENT_AMBULATORY_CARE_PROVIDER_SITE_OTHER): Payer: Self-pay | Admitting: *Deleted

## 2013-07-24 DIAGNOSIS — D509 Iron deficiency anemia, unspecified: Secondary | ICD-10-CM

## 2013-07-24 DIAGNOSIS — K754 Autoimmune hepatitis: Secondary | ICD-10-CM

## 2013-07-29 ENCOUNTER — Other Ambulatory Visit (INDEPENDENT_AMBULATORY_CARE_PROVIDER_SITE_OTHER): Payer: Self-pay | Admitting: Internal Medicine

## 2013-07-31 NOTE — Telephone Encounter (Signed)
Per Dr.Rehman may fill with the 4 refills.

## 2013-08-12 ENCOUNTER — Encounter (HOSPITAL_BASED_OUTPATIENT_CLINIC_OR_DEPARTMENT_OTHER): Payer: Medicare Other

## 2013-08-12 ENCOUNTER — Encounter (HOSPITAL_COMMUNITY): Payer: Medicare Other | Attending: Oncology

## 2013-08-12 DIAGNOSIS — D649 Anemia, unspecified: Secondary | ICD-10-CM

## 2013-08-12 DIAGNOSIS — N039 Chronic nephritic syndrome with unspecified morphologic changes: Secondary | ICD-10-CM

## 2013-08-12 DIAGNOSIS — N289 Disorder of kidney and ureter, unspecified: Secondary | ICD-10-CM

## 2013-08-12 DIAGNOSIS — N189 Chronic kidney disease, unspecified: Secondary | ICD-10-CM

## 2013-08-12 DIAGNOSIS — D631 Anemia in chronic kidney disease: Secondary | ICD-10-CM

## 2013-08-12 DIAGNOSIS — D500 Iron deficiency anemia secondary to blood loss (chronic): Secondary | ICD-10-CM | POA: Diagnosis present

## 2013-08-12 LAB — CBC WITH DIFFERENTIAL/PLATELET
BASOS PCT: 1 % (ref 0–1)
Basophils Absolute: 0 10*3/uL (ref 0.0–0.1)
Eosinophils Absolute: 0.1 10*3/uL (ref 0.0–0.7)
Eosinophils Relative: 3 % (ref 0–5)
HEMATOCRIT: 30 % — AB (ref 36.0–46.0)
Hemoglobin: 10.6 g/dL — ABNORMAL LOW (ref 12.0–15.0)
LYMPHS PCT: 25 % (ref 12–46)
Lymphs Abs: 0.8 10*3/uL (ref 0.7–4.0)
MCH: 29.9 pg (ref 26.0–34.0)
MCHC: 35.3 g/dL (ref 30.0–36.0)
MCV: 84.7 fL (ref 78.0–100.0)
MONOS PCT: 15 % — AB (ref 3–12)
Monocytes Absolute: 0.5 10*3/uL (ref 0.1–1.0)
NEUTROS ABS: 1.7 10*3/uL (ref 1.7–7.7)
NEUTROS PCT: 56 % (ref 43–77)
Platelets: 93 10*3/uL — ABNORMAL LOW (ref 150–400)
RBC: 3.54 MIL/uL — AB (ref 3.87–5.11)
RDW: 21.1 % — ABNORMAL HIGH (ref 11.5–15.5)
WBC: 3.1 10*3/uL — ABNORMAL LOW (ref 4.0–10.5)

## 2013-08-12 LAB — CBC
HCT: 31.3 % — ABNORMAL LOW (ref 36.0–46.0)
Hemoglobin: 10.7 g/dL — ABNORMAL LOW (ref 12.0–15.0)
MCH: 30 pg (ref 26.0–34.0)
MCHC: 34.2 g/dL (ref 30.0–36.0)
MCV: 87.7 fL (ref 78.0–100.0)
PLATELETS: 90 10*3/uL — AB (ref 150–400)
RBC: 3.57 MIL/uL — AB (ref 3.87–5.11)
RDW: 20.8 % — ABNORMAL HIGH (ref 11.5–15.5)
WBC: 3.2 10*3/uL — ABNORMAL LOW (ref 4.0–10.5)

## 2013-08-12 LAB — BASIC METABOLIC PANEL
BUN: 20 mg/dL (ref 6–23)
CHLORIDE: 104 meq/L (ref 96–112)
CO2: 28 mEq/L (ref 19–32)
Calcium: 10.5 mg/dL (ref 8.4–10.5)
Creat: 1.04 mg/dL (ref 0.50–1.10)
Glucose, Bld: 133 mg/dL — ABNORMAL HIGH (ref 70–99)
Potassium: 3.8 mEq/L (ref 3.5–5.3)
SODIUM: 139 meq/L (ref 135–145)

## 2013-08-12 MED ORDER — SODIUM CHLORIDE 0.9 % IJ SOLN: 3.0000 mL | Freq: Once | INTRAMUSCULAR | Status: DC | PRN

## 2013-08-12 MED ORDER — DARBEPOETIN ALFA-POLYSORBATE 300 MCG/0.6ML IJ SOLN
500.0000 ug | INTRAMUSCULAR | Status: DC
  Administered 2013-08-12: 500 ug via SUBCUTANEOUS

## 2013-08-12 MED ORDER — DARBEPOETIN ALFA-POLYSORBATE 500 MCG/ML IJ SOLN
INTRAMUSCULAR | Status: AC
  Filled 2013-08-12: qty 1

## 2013-08-12 NOTE — Progress Notes (Signed)
Eileen Santana's reason for visit today is for an injection and labs as scheduled per MD orders.  Labs were drawn prior to administration of ordered medication.  .  Eileen Santana also received aranesp per MD orders; see MAR for administration details.  Eileen Santana tolerated all procedures well and without incident; questions were answered and patient was discharged. 

## 2013-08-12 NOTE — Progress Notes (Signed)
Eileen Santana's reason for visit today are for labs as scheduled per MD orders; Dr. Karilyn Cota ordered a CBC and BMET which were sent to lab with requisitions and face sheet and handed off to Porter in lab.  Venipuncture performed with a 23 gauge butterfly needle to L Antecubital.  [name] tolerated venipuncture well and without incident; questions were answered and patient was discharged.

## 2013-08-14 ENCOUNTER — Telehealth (INDEPENDENT_AMBULATORY_CARE_PROVIDER_SITE_OTHER): Payer: Self-pay | Admitting: *Deleted

## 2013-08-14 DIAGNOSIS — K754 Autoimmune hepatitis: Secondary | ICD-10-CM

## 2013-08-14 DIAGNOSIS — D509 Iron deficiency anemia, unspecified: Secondary | ICD-10-CM

## 2013-08-14 NOTE — Telephone Encounter (Signed)
Per Dr.Rehman the patient will need to have labs drawn in 8 weeks. 

## 2013-08-26 ENCOUNTER — Ambulatory Visit (HOSPITAL_COMMUNITY): Payer: Medicare Other

## 2013-08-26 ENCOUNTER — Other Ambulatory Visit (HOSPITAL_COMMUNITY): Payer: Medicare Other

## 2013-09-02 ENCOUNTER — Encounter (HOSPITAL_BASED_OUTPATIENT_CLINIC_OR_DEPARTMENT_OTHER): Payer: Medicare Other

## 2013-09-02 ENCOUNTER — Encounter (HOSPITAL_COMMUNITY): Payer: Self-pay

## 2013-09-02 VITALS — BP 128/69 | HR 80 | Temp 98.1°F | Resp 18 | Wt 153.4 lb

## 2013-09-02 DIAGNOSIS — N183 Chronic kidney disease, stage 3 unspecified: Secondary | ICD-10-CM

## 2013-09-02 DIAGNOSIS — D649 Anemia, unspecified: Secondary | ICD-10-CM

## 2013-09-02 DIAGNOSIS — D631 Anemia in chronic kidney disease: Secondary | ICD-10-CM

## 2013-09-02 DIAGNOSIS — N189 Chronic kidney disease, unspecified: Principal | ICD-10-CM

## 2013-09-02 DIAGNOSIS — Q2730 Arteriovenous malformation, site unspecified: Secondary | ICD-10-CM

## 2013-09-02 DIAGNOSIS — D500 Iron deficiency anemia secondary to blood loss (chronic): Secondary | ICD-10-CM

## 2013-09-02 DIAGNOSIS — Q279 Congenital malformation of peripheral vascular system, unspecified: Secondary | ICD-10-CM

## 2013-09-02 DIAGNOSIS — N039 Chronic nephritic syndrome with unspecified morphologic changes: Secondary | ICD-10-CM

## 2013-09-02 DIAGNOSIS — D731 Hypersplenism: Secondary | ICD-10-CM

## 2013-09-02 DIAGNOSIS — K754 Autoimmune hepatitis: Secondary | ICD-10-CM

## 2013-09-02 LAB — CBC
HEMATOCRIT: 31.5 % — AB (ref 36.0–46.0)
HEMOGLOBIN: 10.8 g/dL — AB (ref 12.0–15.0)
MCH: 29.3 pg (ref 26.0–34.0)
MCHC: 34.3 g/dL (ref 30.0–36.0)
MCV: 85.6 fL (ref 78.0–100.0)
Platelets: 103 10*3/uL — ABNORMAL LOW (ref 150–400)
RBC: 3.68 MIL/uL — AB (ref 3.87–5.11)
RDW: 18.6 % — ABNORMAL HIGH (ref 11.5–15.5)
WBC: 3.9 10*3/uL — ABNORMAL LOW (ref 4.0–10.5)

## 2013-09-02 MED ORDER — DARBEPOETIN ALFA-POLYSORBATE 300 MCG/0.6ML IJ SOLN
500.0000 ug | INTRAMUSCULAR | Status: DC
  Administered 2013-09-02: 500 ug via SUBCUTANEOUS

## 2013-09-02 MED ORDER — DARBEPOETIN ALFA-POLYSORBATE 500 MCG/ML IJ SOLN
INTRAMUSCULAR | Status: AC
  Filled 2013-09-02: qty 1

## 2013-09-02 NOTE — Progress Notes (Signed)
Eileen Santana presented for labwork. Labs per MD order drawn via Peripheral Line 21 gauge needle inserted in right upper forearm.  Good blood return present. Procedure without incident.  Needle removed intact. Patient tolerated procedure well.

## 2013-09-02 NOTE — Progress Notes (Signed)
[facility]  OFFICE PROGRESS NOTE  Eileen Courier, MD [address] [place] [address]  DIAGNOSIS: Anemia in chronic kidney disease  Iron deficiency anemia secondary to blood loss (chronic) - Plan: darbepoetin (ARANESP) injection 500 mcg, Care order/instruction  AVM (congenital arteriovenous malformation)  Autoimmune hepatitis  Hypersplenism  Chief Complaint  Patient presents with  . Anemia in chronic kidney disease    CURRENT THERAPY: Intermittent iron infusion last on 06/26/2013 in 07/03/2013 along with Aranesp every 3 weeks with last dose of 500 mcg on 08/12/2013 at which time hemoglobin was 10.6.  INTERVAL HISTORY: Eileen Santana 80 y.o. female returns for followup of anemia of chronic disease with chronic renal failure and iron deficiency with blood loss secondary to intestinal AV malformations, chronic kidney disease, hypersplenism and also depletion of iron stores by the use of Aranesp. Last ferritin on 06/24/2013 was 48 after which iron was administered intravenously.. Underlying autoimmune hepatitis with hypersplenism. She denies any significant lower  s extremity swelling, cough, wheezing, PND, orthopnea, palpitations, fever, night sweats, vaginal bleeding, melena, hematochezia, hematuria, epistaxis, or hemoptysis. Appetite is good with no nausea, vomiting, diarrhea, constipation, or incontinence.   MEDICAL HISTORY: Past Medical History  Diagnosis Date  . History of GI bleed     Associated with NSAIDS  . Iron deficiency anemia     Transfusion dependent  . DJD (degenerative joint disease)   . Essential hypertension, benign   . History of colitis   . Ileitis   . Pancytopenia   . Autoimmune hepatitis     With leukocytoclastic vasculitis  . Heart block AV second degree March 2013  . Bronchitis   . Steroid-induced myopathy   . Renal calculus 08/12/2011  . Rheumatoid arthritis(714.0)   . Colon ulcer 04/2010    NSAID related   . Gastric ulcer 04/2010    NSAID related  . Cancer   . UGI bleed 09/20/2011    Focal area of gastritis oozing of blood  . Gastric AVM 09/20/2011  . Candida esophagitis 09/20/2011  . Paroxysmal atrial fibrillation   . Pericarditis     2D Echo EF 65%-70%  . Chronic kidney disease     Probably stage II to III with a recent acute exacerbation  . Cholestatic hepatitis   . UTI (lower urinary tract infection)     history    INTERIM HISTORY: has Pancytopenia; Sedimentation rate elevation; Elevated liver enzymes; GI bleed; Microcytic anemia; Orthostatic hypotension; Bradycardia; Renal failure; Autoimmune hepatitis; HTN (hypertension), benign; Abdominal pain, acute, generalized; Nausea; Diarrhea; Hypomagnesemia; Hypokalemia; Prolonged Q-T interval on ECG; UTI (urinary tract infection); Gallstones; Thrombocytopenia; Renal calculus; Chronic back pain; Leg pain; Rheumatoid arthritis; Acute renal insufficiency; Hypoalbuminemia; Guaiac positive stools; UGI bleed; Gastric AVM; Candida esophagitis; Anemia; PAT (paroxysmal atrial tachycardia); Hypertrophic cardiomyopathy; Hypersplenism; Iron deficiency anemia secondary to blood loss (chronic); AVM (congenital arteriovenous malformation); Atrial tachycardia; and Atrial flutter on her problem list.    ALLERGIES:  is allergic to iohexol; ivp dye; naprosyn; red blood cells; verapamil; vicodin; and sulfa antibiotics.  MEDICATIONS: has a current medication list which includes the following prescription(s): acetaminophen, azathioprine, vitamin d, coenzyme q10, darbepoetin alfa-albumin, dexilant, diclofenac sodium, diltiazem, fish oil-omega-3 fatty acids, folic acid, furosemide, magnesium oxide, metoprolol tartrate, multivitamin with minerals, potassium chloride sa, potassium chloride sa, prednisone, prednisone, ferumoxytol, spironolactone, travoprost (bak free), ursodiol, ursodiol, and vitamin b-12, and the following Facility-Administered Medications: darbepoetin and  diphenhydramine.  SURGICAL HISTORY:  Past  Surgical History  Procedure Laterality Date  . Colonoscopy  04/2010  . Upper gastrointestinal endoscopy  04/2010  . Esophagogastroduodenoscopy  09/20/2011    Status post APC.  Marland Kitchen Esophagogastroduodenoscopy  09/20/2011    Procedure: ESOPHAGOGASTRODUODENOSCOPY (EGD);  Surgeon: Malissa Hippo, MD;  Location: [facility];  Service: Endoscopy;  Laterality: N/A;  . Cyst removal hand      Elbow    FAMILY HISTORY: family history includes Arthritis in her brother; COPD in her brother; Heart disease (age of onset: 82) in her mother; Heart disease (age of onset: 35) in her father; Hypertension in her brother and sister; Osteoporosis in her sister; Stroke in her mother.  SOCIAL HISTORY:  reports that she has been smoking Cigarettes.  She has been smoking about 0.00 packs per day. She has never used smokeless tobacco. She reports that she does not drink alcohol or use illicit drugs.  REVIEW OF SYSTEMS:  Other than that discussed above is noncontributory.  PHYSICAL EXAMINATION: ECOG PERFORMANCE STATUS: 1 - Symptomatic but completely ambulatory  Blood pressure 128/69, pulse 80, temperature 98.1 F (36.7 C), resp. rate 18, weight 153 lb 6.4 oz (69.582 kg), SpO2 100.00%.  GENERAL:alert, no distress and comfortable SKIN: skin color, texture, turgor are normal, no rashes or significant lesions EYES: PERLA; Conjunctiva are pink and non-injected, sclera clear SINUSES: No redness or tenderness over maxillary or ethmoid sinuses OROPHARYNX:no exudate, no erythema on lips, buccal mucosa, or tongue. NECK: supple, thyroid normal size, non-tender, without nodularity. No masses CHEST:  normal AP diameter with no breast masses. LYMPH:  no palpable lymphadenopathy in the cervical, axillary or inguinal LUNGS: clear to auscultation and percussion with normal breathing effort HEART: regular rate & rhythm and no murmurs. ABDOMEN:abdomen soft, non-tender and normal bowel  sounds. No organomegaly, ascites, or CVA tenderness.  MUSCULOSKELETAL:no cyanosis of digits and no clubbing. Range of motion normal.  NEURO: alert & oriented x 3 with fluent speech, no focal motor/sensory deficits   LABORATORY DATA: Lab on 09/02/2013  Component Date Value Ref Range Status  . WBC 09/02/2013 3.9* 4.0 - 10.5 K/uL Final  . RBC 09/02/2013 3.68* 3.87 - 5.11 MIL/uL Final  . Hemoglobin 09/02/2013 10.8* 12.0 - 15.0 g/dL Final  . HCT 78/29/5621 31.5* 36.0 - 46.0 % Final  . MCV 09/02/2013 85.6  78.0 - 100.0 fL Final  . MCH 09/02/2013 29.3  26.0 - 34.0 pg Final  . MCHC 09/02/2013 34.3  30.0 - 36.0 g/dL Final  . RDW 30/86/5784 18.6* 11.5 - 15.5 % Final  . Platelets 09/02/2013 103* 150 - 400 K/uL Final   Comment: SPECIMEN CHECKED FOR CLOTS                          CONSISTENT WITH PREVIOUS RESULT  Lab on 08/12/2013  Component Date Value Ref Range Status  . WBC 08/12/2013 3.2* 4.0 - 10.5 K/uL Final  . RBC 08/12/2013 3.57* 3.87 - 5.11 MIL/uL Final  . Hemoglobin 08/12/2013 10.7* 12.0 - 15.0 g/dL Final  . HCT 69/62/9528 31.3* 36.0 - 46.0 % Final  . MCV 08/12/2013 87.7  78.0 - 100.0 fL Final  . MCH 08/12/2013 30.0  26.0 - 34.0 pg Final  . MCHC 08/12/2013 34.2  30.0 - 36.0 g/dL Final  . RDW 41/32/4401 20.8* 11.5 - 15.5 % Final  . Platelets 08/12/2013 90* 150 - 400 K/uL Final   Comment: SPECIMEN CHECKED FOR CLOTS  PLATELET COUNT CONFIRMED BY SMEAR    PATHOLOGY: no new pathology.  Urinalysis    Component Value Date/Time   COLORURINE YELLOW 06/11/2013 1356   APPEARANCEUR CLEAR 06/11/2013 1356   LABSPEC 1.015 06/11/2013 1356   PHURINE 6.0 06/11/2013 1356   GLUCOSEU NEGATIVE 06/11/2013 1356   HGBUR NEGATIVE 06/11/2013 1356   BILIRUBINUR NEGATIVE 06/11/2013 1356   KETONESUR NEGATIVE 06/11/2013 1356   PROTEINUR NEGATIVE 06/11/2013 1356   UROBILINOGEN 0.2 06/11/2013 1356   NITRITE POSITIVE* 06/11/2013 1356   LEUKOCYTESUR TRACE* 06/11/2013 1356    RADIOGRAPHIC STUDIES: DG  Chest 2 View Status: Final result         PACS Images    Show images for DG Chest 2 View         Study Result    CLINICAL DATA: Irregular heartbeat  EXAM:  CHEST 2 VIEW  COMPARISON: Portable chest x-ray dated August 10, 2011  FINDINGS:  The lungs are mildly hyperinflated. The cardiopericardial silhouette  is enlarged but stable. The pulmonary vascularity is not engorged.  There is mild tortuosity of the descending thoracic aorta. There is  no pleural effusion. The bony thorax is unremarkable.  IMPRESSION:  1. There is mild cardiomegaly without evidence of CHF.  2. There is mild hyperinflation consistent with COPD.  Electronically Signed  By: David Swaziland  On: 06/11/2013      ASSESSMENT:  #1. Mixed anemia with contributions from chronic kidney disease, chronic blood loss due to AVMs, and hypersplenism with bone marrow done in July of 2014 showing no evidence of myelodysplasia or malignancy but with absent iron stores.  #2. 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  #3. Chronic kidney disease, stage III.  #4. Second-degree heart block with recent episode of atrial flutter, treated medically with no intent to use anticoagulants in the future    PLAN:  #1. Aranesp 100 minute and subcutaneous every 3 weeks if hemoglobin is less than 11. #2. Followup in 9 weeks with CBC and ferritin.   All questions were answered. The patient knows to call the clinic with any problems, questions or concerns. We can certainly see the patient much sooner if necessary.   I spent 25 minutes counseling the patient face to face. The total time spent in the appointment was 30 minutes.    Maurilio Lovely, MD 09/02/2013 11:44 AM  DISCLAIMER:  This note was dictated with voice recognition software.  Similar sounding words can inadvertently be transcribed inaccurately and may not be corrected upon review.

## 2013-09-02 NOTE — Patient Instructions (Addendum)
[facility] Discharge Instructions  RECOMMENDATIONS MADE BY THE CONSULTANT AND ANY TEST RESULTS WILL BE SENT TO YOUR REFERRING PHYSICIAN.  EXAM FINDINGS BY THE PHYSICIAN TODAY AND SIGNS OR SYMPTOMS TO REPORT TO CLINIC OR PRIMARY PHYSICIAN: Exam and findings as discussed by Dr Zigmund Daniel.  Continue injection every 3 weeks.  You will see Dr in 9 weeks for re-check.   Thank you for choosing [facility] to provide your oncology and hematology care.  To afford each patient quality time with our providers, please arrive at least 15 minutes before your scheduled appointment time.  With your help, our goal is to use those 15 minutes to complete the necessary work-up to ensure our physicians have the information they need to help with your evaluation and healthcare recommendations.    Effective January 1st, 2014, we ask that you re-schedule your appointment with our physicians should you arrive 10 or more minutes late for your appointment.  We strive to give you quality time with our providers, and arriving late affects you and other patients whose appointments are after yours.    Again, thank you for choosing [facility].  Our hope is that these requests will decrease the amount of time that you wait before being seen by our physicians.       _____________________________________________________________  Should you have questions after your visit to [facility], please contact our office at [phone] between the hours of 8:30 a.m. and 4:30 p.m.  Voicemails left after 4:30 p.m. will not be returned until the following business day.  For prescription refill requests, have your pharmacy contact our office with your prescription refill request.    _______________________________________________________________  We hope that we have given you very good care.  You may receive a patient satisfaction survey in the mail, please complete it and return  it as soon as possible.  We value your feedback!  _______________________________________________________________  Have you asked about our STAR program?  STAR stands for Survivorship Training and Rehabilitation, and this is a nationally recognized cancer care program that focuses on survivorship and rehabilitation.  Cancer and cancer treatments may cause problems, such as, pain, making you feel tired and keeping you from doing the things that you need or want to do. Cancer rehabilitation can help. Our goal is to reduce these troubling effects and help you have the best quality of life possible.  You may receive a survey from a nurse that asks questions about your current state of health.  Based on the survey results, all eligible patients will be referred to the [facility] program for an evaluation so we can better serve you!  A frequently asked questions sheet is available upon request.

## 2013-09-02 NOTE — Progress Notes (Signed)
Eileen Santana's reason for visit today is for an injection and labs as scheduled per MD orders.  Labs were drawn prior to administration of ordered medication.  Eileen Santana also received aranesp per MD orders; see [facility] for administration details.  Eileen Santana tolerated all procedures well and without incident; questions were answered and patient was discharged.

## 2013-09-19 ENCOUNTER — Other Ambulatory Visit (HOSPITAL_COMMUNITY): Payer: Self-pay

## 2013-09-19 DIAGNOSIS — N289 Disorder of kidney and ureter, unspecified: Secondary | ICD-10-CM

## 2013-09-19 DIAGNOSIS — D649 Anemia, unspecified: Secondary | ICD-10-CM

## 2013-09-19 DIAGNOSIS — D61818 Other pancytopenia: Secondary | ICD-10-CM

## 2013-09-23 ENCOUNTER — Encounter (HOSPITAL_BASED_OUTPATIENT_CLINIC_OR_DEPARTMENT_OTHER): Payer: Medicare Other

## 2013-09-23 ENCOUNTER — Encounter (HOSPITAL_COMMUNITY): Payer: Medicare Other | Attending: Oncology

## 2013-09-23 DIAGNOSIS — N183 Chronic kidney disease, stage 3 unspecified: Secondary | ICD-10-CM

## 2013-09-23 DIAGNOSIS — N039 Chronic nephritic syndrome with unspecified morphologic changes: Secondary | ICD-10-CM

## 2013-09-23 DIAGNOSIS — D500 Iron deficiency anemia secondary to blood loss (chronic): Secondary | ICD-10-CM | POA: Diagnosis present

## 2013-09-23 DIAGNOSIS — D649 Anemia, unspecified: Secondary | ICD-10-CM | POA: Insufficient documentation

## 2013-09-23 DIAGNOSIS — N289 Disorder of kidney and ureter, unspecified: Secondary | ICD-10-CM

## 2013-09-23 DIAGNOSIS — D631 Anemia in chronic kidney disease: Secondary | ICD-10-CM

## 2013-09-23 DIAGNOSIS — D61818 Other pancytopenia: Secondary | ICD-10-CM

## 2013-09-23 LAB — CBC WITH DIFFERENTIAL/PLATELET
BASOS ABS: 0.1 10*3/uL (ref 0.0–0.1)
BASOS PCT: 1 % (ref 0–1)
EOS ABS: 0.1 10*3/uL (ref 0.0–0.7)
Eosinophils Relative: 3 % (ref 0–5)
HCT: 27 % — ABNORMAL LOW (ref 36.0–46.0)
Hemoglobin: 8.9 g/dL — ABNORMAL LOW (ref 12.0–15.0)
LYMPHS ABS: 0.6 10*3/uL — AB (ref 0.7–4.0)
Lymphocytes Relative: 18 % (ref 12–46)
MCH: 27 pg (ref 26.0–34.0)
MCHC: 33 g/dL (ref 30.0–36.0)
MCV: 81.8 fL (ref 78.0–100.0)
Monocytes Absolute: 0.5 10*3/uL (ref 0.1–1.0)
Monocytes Relative: 15 % — ABNORMAL HIGH (ref 3–12)
NEUTROS PCT: 64 % (ref 43–77)
Neutro Abs: 2.3 10*3/uL (ref 1.7–7.7)
PLATELETS: 91 10*3/uL — AB (ref 150–400)
RBC: 3.3 MIL/uL — AB (ref 3.87–5.11)
RDW: 17.8 % — AB (ref 11.5–15.5)
WBC: 3.6 10*3/uL — ABNORMAL LOW (ref 4.0–10.5)

## 2013-09-23 MED ORDER — DARBEPOETIN ALFA-POLYSORBATE 300 MCG/0.6ML IJ SOLN
500.0000 ug | INTRAMUSCULAR | Status: DC
  Administered 2013-09-23: 500 ug via SUBCUTANEOUS
  Filled 2013-09-23: qty 1.2

## 2013-09-23 NOTE — Progress Notes (Signed)
LABS FOR CBCD 

## 2013-09-23 NOTE — Progress Notes (Signed)
Eileen Santana's reason for visit today is for an injection and labs as scheduled per MD orders.  Labs were drawn prior to administration of ordered medication.    Chandni Mackins also received aranesp per MD orders; see [facility] for administration details.  Mariann Chaddock tolerated all procedures well and without incident; questions were answered and patient was discharged.

## 2013-09-24 ENCOUNTER — Encounter (HOSPITAL_BASED_OUTPATIENT_CLINIC_OR_DEPARTMENT_OTHER): Payer: Medicare Other

## 2013-09-24 DIAGNOSIS — N039 Chronic nephritic syndrome with unspecified morphologic changes: Secondary | ICD-10-CM

## 2013-09-24 DIAGNOSIS — D500 Iron deficiency anemia secondary to blood loss (chronic): Secondary | ICD-10-CM | POA: Diagnosis not present

## 2013-09-24 DIAGNOSIS — N183 Chronic kidney disease, stage 3 unspecified: Secondary | ICD-10-CM

## 2013-09-24 DIAGNOSIS — D631 Anemia in chronic kidney disease: Secondary | ICD-10-CM

## 2013-09-24 LAB — CBC WITH DIFFERENTIAL/PLATELET
Basophils Absolute: 0 10*3/uL (ref 0.0–0.1)
Basophils Relative: 1 % (ref 0–1)
Eosinophils Absolute: 0.1 10*3/uL (ref 0.0–0.7)
Eosinophils Relative: 5 % (ref 0–5)
HCT: 27.2 % — ABNORMAL LOW (ref 36.0–46.0)
Hemoglobin: 9.3 g/dL — ABNORMAL LOW (ref 12.0–15.0)
LYMPHS PCT: 34 % (ref 12–46)
Lymphs Abs: 0.9 10*3/uL (ref 0.7–4.0)
MCH: 27.8 pg (ref 26.0–34.0)
MCHC: 34.2 g/dL (ref 30.0–36.0)
MCV: 81.2 fL (ref 78.0–100.0)
Monocytes Absolute: 0.3 10*3/uL (ref 0.1–1.0)
Monocytes Relative: 14 % — ABNORMAL HIGH (ref 3–12)
NEUTROS ABS: 1.2 10*3/uL — AB (ref 1.7–7.7)
NEUTROS PCT: 46 % (ref 43–77)
PLATELETS: 105 10*3/uL — AB (ref 150–400)
RBC: 3.35 MIL/uL — ABNORMAL LOW (ref 3.87–5.11)
RDW: 17.8 % — ABNORMAL HIGH (ref 11.5–15.5)
WBC: 2.5 10*3/uL — AB (ref 4.0–10.5)

## 2013-09-24 LAB — IRON AND TIBC
IRON: 25 ug/dL — AB (ref 42–135)
Saturation Ratios: 8 % — ABNORMAL LOW (ref 20–55)
TIBC: 325 ug/dL (ref 250–470)
UIBC: 300 ug/dL (ref 125–400)

## 2013-09-24 LAB — FERRITIN: Ferritin: 31 ng/mL (ref 10–291)

## 2013-09-24 NOTE — Progress Notes (Signed)
Labs drawn for cbcd,iron/tibc,ferr

## 2013-09-25 ENCOUNTER — Telehealth (HOSPITAL_COMMUNITY): Payer: Self-pay

## 2013-09-25 ENCOUNTER — Other Ambulatory Visit (HOSPITAL_COMMUNITY): Payer: Self-pay | Admitting: Hematology and Oncology

## 2013-09-25 ENCOUNTER — Other Ambulatory Visit (INDEPENDENT_AMBULATORY_CARE_PROVIDER_SITE_OTHER): Payer: Self-pay | Admitting: *Deleted

## 2013-09-25 ENCOUNTER — Encounter (INDEPENDENT_AMBULATORY_CARE_PROVIDER_SITE_OTHER): Payer: Self-pay | Admitting: *Deleted

## 2013-09-25 DIAGNOSIS — D509 Iron deficiency anemia, unspecified: Secondary | ICD-10-CM

## 2013-09-25 DIAGNOSIS — K754 Autoimmune hepatitis: Secondary | ICD-10-CM

## 2013-09-25 DIAGNOSIS — D696 Thrombocytopenia, unspecified: Secondary | ICD-10-CM

## 2013-09-25 NOTE — Telephone Encounter (Signed)
Pt notified of current ferritin level and will come in the am for feraheme.

## 2013-09-26 ENCOUNTER — Encounter (HOSPITAL_BASED_OUTPATIENT_CLINIC_OR_DEPARTMENT_OTHER): Payer: Medicare Other

## 2013-09-26 ENCOUNTER — Other Ambulatory Visit (HOSPITAL_COMMUNITY): Payer: Self-pay | Admitting: Oncology

## 2013-09-26 VITALS — BP 98/38 | HR 68 | Temp 97.8°F | Resp 18 | Wt 153.4 lb

## 2013-09-26 DIAGNOSIS — D509 Iron deficiency anemia, unspecified: Secondary | ICD-10-CM

## 2013-09-26 DIAGNOSIS — D500 Iron deficiency anemia secondary to blood loss (chronic): Secondary | ICD-10-CM

## 2013-09-26 DIAGNOSIS — K31819 Angiodysplasia of stomach and duodenum without bleeding: Secondary | ICD-10-CM

## 2013-09-26 DIAGNOSIS — K922 Gastrointestinal hemorrhage, unspecified: Secondary | ICD-10-CM

## 2013-09-26 DIAGNOSIS — Q2730 Arteriovenous malformation, site unspecified: Secondary | ICD-10-CM

## 2013-09-26 MED ORDER — SODIUM CHLORIDE 0.9 % IV SOLN
510.0000 mg | Freq: Once | INTRAVENOUS | Status: AC
  Administered 2013-09-26: 510 mg via INTRAVENOUS
  Filled 2013-09-26: qty 17

## 2013-09-26 MED ORDER — SODIUM CHLORIDE 0.9 % IV SOLN
INTRAVENOUS | Status: DC
  Administered 2013-09-26: 09:00:00 via INTRAVENOUS

## 2013-09-26 NOTE — Progress Notes (Signed)
Patient tolerated infusion well.

## 2013-10-04 ENCOUNTER — Encounter (HOSPITAL_COMMUNITY): Payer: Medicare Other | Attending: Oncology

## 2013-10-04 VITALS — BP 108/51 | HR 66 | Temp 98.0°F | Resp 18

## 2013-10-04 DIAGNOSIS — N289 Disorder of kidney and ureter, unspecified: Secondary | ICD-10-CM | POA: Insufficient documentation

## 2013-10-04 DIAGNOSIS — D509 Iron deficiency anemia, unspecified: Secondary | ICD-10-CM

## 2013-10-04 DIAGNOSIS — D649 Anemia, unspecified: Secondary | ICD-10-CM | POA: Insufficient documentation

## 2013-10-04 DIAGNOSIS — D61818 Other pancytopenia: Secondary | ICD-10-CM | POA: Insufficient documentation

## 2013-10-04 DIAGNOSIS — D500 Iron deficiency anemia secondary to blood loss (chronic): Secondary | ICD-10-CM | POA: Insufficient documentation

## 2013-10-04 MED ORDER — SODIUM CHLORIDE 0.9 % IV SOLN
INTRAVENOUS | Status: AC
  Administered 2013-10-04: 11:00:00 via INTRAVENOUS

## 2013-10-04 MED ORDER — FERUMOXYTOL INJECTION 510 MG/17 ML
510.0000 mg | Freq: Once | INTRAVENOUS | Status: AC
  Administered 2013-10-04: 510 mg via INTRAVENOUS
  Filled 2013-10-04: qty 17

## 2013-10-04 NOTE — Addendum Note (Signed)
Addended by: Elane Fritz on: 10/04/2013 01:27 PM   Modules accepted: Orders

## 2013-10-04 NOTE — Progress Notes (Signed)
Eileen Santana Tolerated iv iron without incident today.  Discharged  Ambulatory

## 2013-10-04 NOTE — Patient Instructions (Signed)
[facility] Discharge Instructions  RECOMMENDATIONS MADE BY THE CONSULTANT AND ANY TEST RESULTS WILL BE SENT TO YOUR REFERRING PHYSICIAN.  You received IV iron today.  Please call the clinic if you have any questions or concerns  Thank you for choosing [facility] to provide your oncology and hematology care.  To afford each patient quality time with our providers, please arrive at least 15 minutes before your scheduled appointment time.  With your help, our goal is to use those 15 minutes to complete the necessary work-up to ensure our physicians have the information they need to help with your evaluation and healthcare recommendations.    Effective January 1st, 2014, we ask that you re-schedule your appointment with our physicians should you arrive 10 or more minutes late for your appointment.  We strive to give you quality time with our providers, and arriving late affects you and other patients whose appointments are after yours.    Again, thank you for choosing [facility].  Our hope is that these requests will decrease the amount of time that you wait before being seen by our physicians.       _____________________________________________________________  Should you have questions after your visit to [facility], please contact our office at [phone] between the hours of 8:30 a.m. and 5:00 p.m.  Voicemails left after 4:30 p.m. will not be returned until the following business day.  For prescription refill requests, have your pharmacy contact our office with your prescription refill request.     

## 2013-10-14 ENCOUNTER — Encounter (HOSPITAL_COMMUNITY): Payer: Medicare Other

## 2013-10-14 ENCOUNTER — Encounter (HOSPITAL_BASED_OUTPATIENT_CLINIC_OR_DEPARTMENT_OTHER): Payer: Medicare Other

## 2013-10-14 ENCOUNTER — Ambulatory Visit (INDEPENDENT_AMBULATORY_CARE_PROVIDER_SITE_OTHER): Payer: Medicare Other | Admitting: Internal Medicine

## 2013-10-14 ENCOUNTER — Encounter (INDEPENDENT_AMBULATORY_CARE_PROVIDER_SITE_OTHER): Payer: Self-pay | Admitting: Internal Medicine

## 2013-10-14 VITALS — BP 120/74 | HR 80 | Temp 98.1°F | Resp 18 | Ht 66.0 in | Wt 151.8 lb

## 2013-10-14 DIAGNOSIS — N289 Disorder of kidney and ureter, unspecified: Secondary | ICD-10-CM | POA: Diagnosis present

## 2013-10-14 DIAGNOSIS — K219 Gastro-esophageal reflux disease without esophagitis: Secondary | ICD-10-CM

## 2013-10-14 DIAGNOSIS — D61818 Other pancytopenia: Secondary | ICD-10-CM

## 2013-10-14 DIAGNOSIS — D509 Iron deficiency anemia, unspecified: Secondary | ICD-10-CM

## 2013-10-14 DIAGNOSIS — D649 Anemia, unspecified: Secondary | ICD-10-CM

## 2013-10-14 DIAGNOSIS — D500 Iron deficiency anemia secondary to blood loss (chronic): Secondary | ICD-10-CM | POA: Diagnosis present

## 2013-10-14 DIAGNOSIS — D631 Anemia in chronic kidney disease: Secondary | ICD-10-CM

## 2013-10-14 DIAGNOSIS — K754 Autoimmune hepatitis: Secondary | ICD-10-CM

## 2013-10-14 DIAGNOSIS — N183 Chronic kidney disease, stage 3 (moderate): Secondary | ICD-10-CM

## 2013-10-14 LAB — HEPATIC FUNCTION PANEL
ALBUMIN: 2.7 g/dL — AB (ref 3.5–5.2)
ALT: 10 U/L (ref 0–35)
AST: 62 U/L — AB (ref 0–37)
Alkaline Phosphatase: 60 U/L (ref 39–117)
Bilirubin, Direct: 0.3 mg/dL (ref 0.0–0.3)
Indirect Bilirubin: 0.8 mg/dL (ref 0.3–0.9)
TOTAL PROTEIN: 6.6 g/dL (ref 6.0–8.3)
Total Bilirubin: 1.1 mg/dL (ref 0.3–1.2)

## 2013-10-14 LAB — CBC WITH DIFFERENTIAL/PLATELET
BASOS ABS: 0 10*3/uL (ref 0.0–0.1)
Basophils Relative: 1 % (ref 0–1)
Eosinophils Absolute: 0.1 10*3/uL (ref 0.0–0.7)
Eosinophils Relative: 1 % (ref 0–5)
HCT: 34.9 % — ABNORMAL LOW (ref 36.0–46.0)
Hemoglobin: 11.5 g/dL — ABNORMAL LOW (ref 12.0–15.0)
LYMPHS PCT: 13 % (ref 12–46)
Lymphs Abs: 0.5 10*3/uL — ABNORMAL LOW (ref 0.7–4.0)
MCH: 28.5 pg (ref 26.0–34.0)
MCHC: 33 g/dL (ref 30.0–36.0)
MCV: 86.4 fL (ref 78.0–100.0)
Monocytes Absolute: 0.4 10*3/uL (ref 0.1–1.0)
Monocytes Relative: 12 % (ref 3–12)
NEUTROS ABS: 2.7 10*3/uL (ref 1.7–7.7)
Neutrophils Relative %: 73 % (ref 43–77)
PLATELETS: 87 10*3/uL — AB (ref 150–400)
RBC: 4.04 MIL/uL (ref 3.87–5.11)
RDW: 21.6 % — ABNORMAL HIGH (ref 11.5–15.5)
SMEAR REVIEW: DECREASED
WBC: 3.8 10*3/uL — AB (ref 4.0–10.5)

## 2013-10-14 LAB — MAGNESIUM: Magnesium: 1.4 mg/dL — ABNORMAL LOW (ref 1.5–2.5)

## 2013-10-14 MED ORDER — SPIRONOLACTONE 50 MG PO TABS: 50.0000 mg | ORAL_TABLET | Freq: Every day | ORAL | Status: DC

## 2013-10-14 NOTE — Progress Notes (Signed)
Hemoglobin 11.5. Aranesp not given, treatment parameters not met. Patient to return to clinic in 3 weeks as scheduled.

## 2013-10-14 NOTE — Patient Instructions (Signed)
Hemoccult x1. Physician will call with results of blood tests when completed

## 2013-10-14 NOTE — Progress Notes (Signed)
Labs for hfp,cbcd,mg

## 2013-10-14 NOTE — Progress Notes (Signed)
Presenting complaint;  Followup for autoimmune hepatitis GERD and in deficiency anemia.  Subjective:  Patient is 80 year old African female with multiple medical problems including autoimmune hepatitis, GERD and in deficiency anemia who presents for scheduled visit. She was last seen 4 months ago. She has no complaints other than lower back pain. She states she uses cane only when she is away from home. She wants to know what she could do to keep her hemoglobin from dropping. He received iron infusion about 2 weeks ago and she's also on Aranesp. She denies melena rectal bleeding hematuria or vaginal bleeding. She has not experienced any difficulty in dropping Dexilant dose to every other day. She denies nausea vomiting dysphagia chronic cough or hoarseness. She has very good appetite. Her bowels move daily. She has to take furosemide once in a while. She is not having cramps.   Current Medications: Outpatient Encounter Prescriptions as of 10/14/2013  Medication Sig  . acetaminophen (TYLENOL) 325 MG tablet Take 650 mg by mouth every 6 (six) hours as needed. For pain  . azaTHIOprine (IMURAN) 50 MG tablet TAKE 1 AND 1/2 TABLETS BY MOUTH ONCE DAILY  . Cholecalciferol (VITAMIN D) 2000 UNITS tablet Take 2,000 Units by mouth daily.  . Coenzyme Q10 200 MG capsule Take 200 mg by mouth daily.  . Darbepoetin Alfa-Albumin (ARANESP IJ) Inject as directed. PRN  . DEXILANT 60 MG capsule TAKE 1 CAPSULE BY MOUTH EVERY OTHER DAY  . diclofenac sodium (VOLTAREN) 1 % GEL Apply 1 application topically daily as needed (for pain). Apply to back and legs  . diltiazem (CARDIZEM CD) 120 MG 24 hr capsule Take 1 capsule (120 mg total) by mouth daily.  . fish oil-omega-3 fatty acids 1000 MG capsule Take 1 g by mouth daily.  . folic acid (FOLVITE) 800 MCG tablet Take 800 mcg by mouth daily.   . furosemide (LASIX) 20 MG tablet Take 20 mg by mouth daily as needed. For fluid retention  . magnesium oxide (MAG-OX) 400 MG  tablet Take 400 mg by mouth 4 (four) times a week. Takes Monday Wednesday Friday and Saturday  . metoprolol tartrate (LOPRESSOR) 25 MG tablet Take 1 tablet (25 mg total) by mouth 2 (two) times daily.  . Multiple Vitamins-Minerals (MULTIVITAMIN WITH MINERALS) tablet Take 1 tablet by mouth daily.    . potassium chloride SA (K-DUR,KLOR-CON) 20 MEQ tablet Take 20 mEq by mouth See admin instructions. Takes 5 times weekly on Monday, Wednesday, Friday, Saturday and Sunday  . potassium chloride SA (K-DUR,KLOR-CON) 20 MEQ tablet TAKE 1 TABLET BY MOUTH ONCE DAILY  . predniSONE (DELTASONE) 5 MG tablet TAKE 1&1/2 TABLET BY MOUTH EVERY MORNING  . sodium chloride 0.9 % SOLN 100 mL with ferumoxytol 510 MG/17ML SOLN 1,020 mg Inject 1,020 mg into the vein as needed.   Marland Kitchen spironolactone (ALDACTONE) 50 MG tablet Take 50 mg by mouth daily.   . Travoprost, BAK Free, (TRAVATAN) 0.004 % SOLN ophthalmic solution Place 1 drop into both eyes at bedtime.  . ursodiol (ACTIGALL) 250 MG tablet TAKE 2 TABLETS BY MOUTH TWICE DAILY  . vitamin B-12 (CYANOCOBALAMIN) 1000 MCG tablet Take 1,000 mcg by mouth daily.  . [DISCONTINUED] predniSONE (DELTASONE) 5 MG tablet TAKE 1&1/2 TABLET BY MOUTH EVERY MORNING  . [DISCONTINUED] ursodiol (ACTIGALL) 300 MG capsule Take 300 mg by mouth 2 (two) times daily.      Objective: Blood pressure 120/74, pulse 80, temperature 98.1 F (36.7 C), temperature source Oral, resp. rate 18, height 5\' 6"  (1.676 m),  weight 151 lb 12.8 oz (68.856 kg). Patient is alert and in no acute distress. Conjunctiva is pink. Sclera is muddy but nonicteric Oropharyngeal mucosa is normal. No neck masses or thyromegaly noted. Cardiac exam with regular rhythm normal S1 and S2. Faint SEM noted at LLSB. Lungs are clear to auscultation. Abdomen symmetrical soft and nontender without organomegaly or masses. No LE edema or clubbing noted.  Labs/studies Results: Lab data and 09/24/2013.  WBC 2.5, H&H 9.3 and 27.2;  platelet count 105K. Serum iron 25, TIBC 325 and saturation 8%. Serum ferritin 31. AST was 66 and ALT 13 on 06/24/2013 and albumin was 3.1.  Assessment:  #1. Autoimmune hepatitis. She remains in remission. ALT has been normal and AST has been mildly elevated which is possibly from nonhepatic source. She is due for repeat labs. #2. GERD. She is doing well with every other day PPI. #3. History of iron deficiency anemia. She had EGD 2 years ago with ablation a few telangiectasia. Last colonoscopy was in April 2012 and she presented with iron deficiency anemia and GI bleed and it reveals large ulcerated ileocecal valve felt to be secondary to NSAID therapy. If she is heme positive she may benefit from further evaluation. #4. History of hypomagnesemia.   Plan:  Hemoccult x1. Patient will have LFTs and serum magnesium with other blood work to be done to speciality clinic. Continue Azathioprine at present dose of 75 mg by mouth daily. Office visit in 4 months.

## 2013-10-15 ENCOUNTER — Encounter (INDEPENDENT_AMBULATORY_CARE_PROVIDER_SITE_OTHER): Payer: Self-pay | Admitting: *Deleted

## 2013-10-15 ENCOUNTER — Telehealth (INDEPENDENT_AMBULATORY_CARE_PROVIDER_SITE_OTHER): Payer: Self-pay | Admitting: *Deleted

## 2013-10-15 NOTE — Telephone Encounter (Signed)
   Diagnosis:    Result(s)   Card 1: Positive:            Completed by: Shelton Soler,LPN   HEMOCCULT SENSA DEVELOPER: LOT#:[id number]   EXPIRATION DATE: 9-17   HEMOCCULT SENSA CARD:  LOT#:  [id number] EXPIRATION DATE: 07/18   CARD CONTROL RESULTS:  POSITIVE: Positive NEGATIVE: Negative    ADDITIONAL COMMENTS:

## 2013-10-16 ENCOUNTER — Telehealth (INDEPENDENT_AMBULATORY_CARE_PROVIDER_SITE_OTHER): Payer: Self-pay | Admitting: *Deleted

## 2013-10-16 DIAGNOSIS — R748 Abnormal levels of other serum enzymes: Secondary | ICD-10-CM

## 2013-10-16 DIAGNOSIS — K754 Autoimmune hepatitis: Secondary | ICD-10-CM

## 2013-10-16 NOTE — Telephone Encounter (Signed)
Per Dr.Rehman the patient will need to have labs drawn in 1 month. 

## 2013-10-20 ENCOUNTER — Emergency Department (HOSPITAL_COMMUNITY)
Admission: EM | Admit: 2013-10-20 | Discharge: 2013-10-20 | Disposition: A | Discharge status: Home / Self Care | Payer: Medicare Other | Attending: Emergency Medicine | Admitting: Emergency Medicine

## 2013-10-20 ENCOUNTER — Encounter (HOSPITAL_COMMUNITY): Payer: Self-pay | Admitting: Emergency Medicine

## 2013-10-20 ENCOUNTER — Emergency Department (HOSPITAL_COMMUNITY): Payer: Medicare Other

## 2013-10-20 DIAGNOSIS — I129 Hypertensive chronic kidney disease with stage 1 through stage 4 chronic kidney disease, or unspecified chronic kidney disease: Secondary | ICD-10-CM | POA: Insufficient documentation

## 2013-10-20 DIAGNOSIS — Z8619 Personal history of other infectious and parasitic diseases: Secondary | ICD-10-CM | POA: Diagnosis not present

## 2013-10-20 DIAGNOSIS — Z87440 Personal history of urinary (tract) infections: Secondary | ICD-10-CM | POA: Diagnosis not present

## 2013-10-20 DIAGNOSIS — Z859 Personal history of malignant neoplasm, unspecified: Secondary | ICD-10-CM | POA: Diagnosis not present

## 2013-10-20 DIAGNOSIS — Z720 Tobacco use: Secondary | ICD-10-CM | POA: Diagnosis not present

## 2013-10-20 DIAGNOSIS — Z87442 Personal history of urinary calculi: Secondary | ICD-10-CM | POA: Insufficient documentation

## 2013-10-20 DIAGNOSIS — Z791 Long term (current) use of non-steroidal anti-inflammatories (NSAID): Secondary | ICD-10-CM | POA: Insufficient documentation

## 2013-10-20 DIAGNOSIS — R059 Cough, unspecified: Secondary | ICD-10-CM

## 2013-10-20 DIAGNOSIS — Z7952 Long term (current) use of systemic steroids: Secondary | ICD-10-CM | POA: Diagnosis not present

## 2013-10-20 DIAGNOSIS — Z8719 Personal history of other diseases of the digestive system: Secondary | ICD-10-CM | POA: Insufficient documentation

## 2013-10-20 DIAGNOSIS — Z792 Long term (current) use of antibiotics: Secondary | ICD-10-CM | POA: Diagnosis not present

## 2013-10-20 DIAGNOSIS — D509 Iron deficiency anemia, unspecified: Secondary | ICD-10-CM | POA: Insufficient documentation

## 2013-10-20 DIAGNOSIS — N189 Chronic kidney disease, unspecified: Secondary | ICD-10-CM | POA: Insufficient documentation

## 2013-10-20 DIAGNOSIS — R05 Cough: Secondary | ICD-10-CM | POA: Insufficient documentation

## 2013-10-20 MED ORDER — AZITHROMYCIN 250 MG PO TABS
250.0000 mg | ORAL_TABLET | Freq: Every day | ORAL | Status: DC
Start: 2013-10-20 — End: 2013-11-07

## 2013-10-20 MED ORDER — BENZONATATE 100 MG PO CAPS: 100.0000 mg | ORAL_CAPSULE | Freq: Three times a day (TID) | ORAL | Status: DC

## 2013-10-20 MED ORDER — BENZONATATE 100 MG PO CAPS
100.0000 mg | ORAL_CAPSULE | Freq: Once | ORAL | Status: AC
  Administered 2013-10-20: 100 mg via ORAL
  Filled 2013-10-20: qty 1

## 2013-10-20 NOTE — ED Notes (Signed)
PT c/o productive clear cough x2 weeks. PT denies any further symptoms and no SOB.

## 2013-10-20 NOTE — ED Provider Notes (Signed)
CSN: [id number]     Arrival date & time 10/20/13  1135 History  This chart was scribed for Rolland Porter, MD by Gwenyth Ober, ED Scribe. This patient was seen in room APA16A/APA16A and the patient's care was started at 12:22 PM.    Chief Complaint  Patient presents with  . Cough   The history is provided by the patient. No language interpreter was used.   HPI Comments: Eileen Santana is a 80 y.o. female who presents to the Emergency Department complaining of productive cough with white discharge that started 2 weeks ago that became worse today. She denies fever, pain, and hematemesis.  Pt saw PCP who prescribed codeine syrup with no relief to her symptoms. Pt had ulcers a few years ago and now receives iron transfusions every 2-3 months. Her hemoglobin was last checked one week ago and was 11.2. She denies history of pneumonia and lung problems.  PCP is Dr. Loleta Chance  Past Medical History  Diagnosis Date  . History of GI bleed     Associated with NSAIDS  . Iron deficiency anemia     Transfusion dependent  . DJD (degenerative joint disease)   . Essential hypertension, benign   . History of colitis   . Ileitis   . Pancytopenia   . Autoimmune hepatitis     With leukocytoclastic vasculitis  . Heart block AV second degree March 2013  . Bronchitis   . Steroid-induced myopathy   . Renal calculus 08/12/2011  . Rheumatoid arthritis(714.0)   . Colon ulcer 04/2010    NSAID related  . Gastric ulcer 04/2010    NSAID related  . Cancer   . UGI bleed 09/20/2011    Focal area of gastritis oozing of blood  . Gastric AVM 09/20/2011  . Candida esophagitis 09/20/2011  . Paroxysmal atrial fibrillation   . Pericarditis     2D Echo EF 65%-70%  . Chronic kidney disease     Probably stage II to III with a recent acute exacerbation  . Cholestatic hepatitis   . UTI (lower urinary tract infection)     history   Past Surgical History  Procedure Laterality Date  . Colonoscopy  04/2010  . Upper  gastrointestinal endoscopy  04/2010  . Esophagogastroduodenoscopy  09/20/2011    Status post APC.  Marland Kitchen Esophagogastroduodenoscopy  09/20/2011    Procedure: ESOPHAGOGASTRODUODENOSCOPY (EGD);  Surgeon: Malissa Hippo, MD;  Location: [facility];  Service: Endoscopy;  Laterality: N/A;  . Cyst removal hand      Elbow   Family History  Problem Relation Age of Onset  . Stroke Mother   . Hypertension Sister   . Hypertension Brother   . Heart disease Mother 28  . Heart disease Father 95  . COPD Brother   . Arthritis Brother   . Osteoporosis Sister    History  Substance Use Topics  . Smoking status: Current Some Day Smoker -- 0.25 packs/day    Types: Cigarettes  . Smokeless tobacco: Never Used  . Alcohol Use: No   OB History   Grav Para Term Preterm Abortions TAB SAB Ect Mult Living                 Review of Systems  Constitutional: Negative for fever, chills, diaphoresis, appetite change and fatigue.  HENT: Negative for mouth sores, sore throat and trouble swallowing.   Eyes: Negative for visual disturbance.  Respiratory: Positive for cough. Negative for chest tightness, shortness of breath and wheezing.   Cardiovascular:  Negative for chest pain.  Gastrointestinal: Negative for nausea, vomiting, abdominal pain, diarrhea and abdominal distention.  Endocrine: Negative for polydipsia, polyphagia and polyuria.  Genitourinary: Negative for dysuria, frequency and hematuria.  Musculoskeletal: Negative for gait problem.  Skin: Negative for color change, pallor and rash.  Neurological: Negative for dizziness, syncope, light-headedness and headaches.  Hematological: Does not bruise/bleed easily.  Psychiatric/Behavioral: Negative for behavioral problems and confusion.      Allergies  Iohexol; Ivp dye; Naprosyn; Red blood cells; Verapamil; Vicodin; and Sulfa antibiotics  Home Medications   Prior to Admission medications   Medication Sig Start Date End Date Taking? Authorizing  Provider  acetaminophen (TYLENOL) 325 MG tablet Take 650 mg by mouth every 6 (six) hours as needed. For pain    Historical Provider, MD  azaTHIOprine (IMURAN) 50 MG tablet TAKE 1 AND 1/2 TABLETS BY MOUTH ONCE DAILY 07/01/13   Len Blalock, NP  azithromycin (ZITHROMAX Z-PAK) 250 MG tablet Take 1 tablet (250 mg total) by mouth daily. 10/20/13   Rolland Porter, MD  benzonatate (TESSALON) 100 MG capsule Take 1 capsule (100 mg total) by mouth every 8 (eight) hours. 10/20/13   Rolland Porter, MD  Cholecalciferol (VITAMIN D) 2000 UNITS tablet Take 2,000 Units by mouth daily.    Historical Provider, MD  Coenzyme Q10 200 MG capsule Take 200 mg by mouth daily.    Historical Provider, MD  Darbepoetin Alfa-Albumin (ARANESP IJ) Inject as directed. PRN    Historical Provider, MD  DEXILANT 60 MG capsule TAKE 1 CAPSULE BY MOUTH EVERY OTHER DAY    Len Blalock, NP  diclofenac sodium (VOLTAREN) 1 % GEL Apply 1 application topically daily as needed (for pain). Apply to back and legs    Historical Provider, MD  diltiazem (CARDIZEM CD) 120 MG 24 hr capsule Take 1 capsule (120 mg total) by mouth daily. 06/27/13   Jodelle Gross, NP  fish oil-omega-3 fatty acids 1000 MG capsule Take 1 g by mouth daily.    Historical Provider, MD  folic acid (FOLVITE) 800 MCG tablet Take 800 mcg by mouth daily.     Historical Provider, MD  furosemide (LASIX) 20 MG tablet Take 20 mg by mouth daily as needed. For fluid retention 04/26/11   Malissa Hippo, MD  magnesium oxide (MAG-OX) 400 MG tablet Take 400 mg by mouth 4 (four) times a week. Takes Monday Wednesday Friday and Saturday    Historical Provider, MD  metoprolol tartrate (LOPRESSOR) 25 MG tablet Take 1 tablet (25 mg total) by mouth 2 (two) times daily. 06/27/13   Jodelle Gross, NP  Multiple Vitamins-Minerals (MULTIVITAMIN WITH MINERALS) tablet Take 1 tablet by mouth daily.      Historical Provider, MD  potassium chloride SA (K-DUR,KLOR-CON) 20 MEQ tablet Take 20 mEq by mouth See  admin instructions. Takes 5 times weekly on Monday, Wednesday, Friday, Saturday and Sunday    Historical Provider, MD  potassium chloride SA (K-DUR,KLOR-CON) 20 MEQ tablet TAKE 1 TABLET BY MOUTH ONCE DAILY    Len Blalock, NP  predniSONE (DELTASONE) 5 MG tablet TAKE 1&1/2 TABLET BY MOUTH EVERY MORNING    Terri L Setzer, NP  sodium chloride 0.9 % SOLN 100 mL with ferumoxytol 510 MG/17ML SOLN 1,020 mg Inject 1,020 mg into the vein as needed.     Historical Provider, MD  spironolactone (ALDACTONE) 50 MG tablet Take 1 tablet (50 mg total) by mouth daily. 10/14/13   Malissa Hippo, MD  Travoprost, BAK Free, (TRAVATAN)  0.004 % SOLN ophthalmic solution Place 1 drop into both eyes at bedtime. 09/20/11   Elliot Cousin, MD  ursodiol (ACTIGALL) 250 MG tablet TAKE 2 TABLETS BY MOUTH TWICE DAILY    Len Blalock, NP  vitamin B-12 (CYANOCOBALAMIN) 1000 MCG tablet Take 1,000 mcg by mouth daily.    Historical Provider, MD   BP 134/64  Pulse 94  Temp(Src) 98.2 F (36.8 C) (Oral)  Resp 18  Ht 5\' 5"  (1.651 m)  Wt 153 lb (69.4 kg)  BMI 25.46 kg/m2  SpO2 100% Physical Exam  Nursing note and vitals reviewed. Constitutional: She is oriented to person, place, and time. She appears well-developed and well-nourished. No distress.  HENT:  Head: Normocephalic.  Eyes: Conjunctivae are normal. Pupils are equal, round, and reactive to light. No scleral icterus.  Neck: Normal range of motion. Neck supple. No thyromegaly present.  Cardiovascular: Normal rate and regular rhythm.  Exam reveals no gallop and no friction rub.   No murmur heard. Pulmonary/Chest: Effort normal and breath sounds normal. No respiratory distress. She has no wheezes. She has no rales.  Abdominal: Soft. Bowel sounds are normal. She exhibits no distension. There is no tenderness. There is no rebound.  Musculoskeletal: Normal range of motion.  Neurological: She is alert and oriented to person, place, and time.  Skin: Skin is warm and dry. No  rash noted.  Psychiatric: She has a normal mood and affect. Her behavior is normal.    ED Course  Procedures (including critical care time) DIAGNOSTIC STUDIES: Oxygen Saturation is 100% on RA, normal by my interpretation.    COORDINATION OF CARE: 12:29 PM Discussed treatment plan with pt at bedside and pt agreed to plan.    Labs Review Labs Reviewed - No data to display  Imaging Review No results found.   EKG Interpretation None      MDM   Final diagnoses:  Cough    Patient appears nontoxic. No cough noted in the emergency room. Not hypoxemic. Not febrile. Normal chest x-ray. Patient appropriate for simple cough suppressants. Premature followup. ER with acute changes.    , MD 10/23/13 [phone]

## 2013-10-20 NOTE — Discharge Instructions (Signed)
Continue the prescription cough medicine prescribed by your primary care doctor. Use Tessalon as needed for persistent cough. Return to the emergency room, or see your physician with fevers or any worsening symptoms.  Cough, Adult  A cough is a reflex that helps clear your throat and airways. It can help heal the body or may be a reaction to an irritated airway. A cough may only last 2 or 3 weeks (acute) or may last more than 8 weeks (chronic).  CAUSES Acute cough:  Viral or bacterial infections. Chronic cough:  Infections.  Allergies.  Asthma.  Post-nasal drip.  Smoking.  Heartburn or acid reflux.  Some medicines.  Chronic lung problems (COPD).  Cancer. SYMPTOMS   Cough.  Fever.  Chest pain.  Increased breathing rate.  High-pitched whistling sound when breathing (wheezing).  Colored mucus that you cough up (sputum). TREATMENT   A bacterial cough may be treated with antibiotic medicine.  A viral cough must run its course and will not respond to antibiotics.  Your caregiver may recommend other treatments if you have a chronic cough. HOME CARE INSTRUCTIONS   Only take over-the-counter or prescription medicines for pain, discomfort, or fever as directed by your caregiver. Use cough suppressants only as directed by your caregiver.  Use a cold steam vaporizer or humidifier in your bedroom or home to help loosen secretions.  Sleep in a semi-upright position if your cough is worse at night.  Rest as needed.  Stop smoking if you smoke. SEEK IMMEDIATE MEDICAL CARE IF:   You have pus in your sputum.  Your cough starts to worsen.  You cannot control your cough with suppressants and are losing sleep.  You begin coughing up blood.  You have difficulty breathing.  You develop pain which is getting worse or is uncontrolled with medicine.  You have a fever. MAKE SURE YOU:   Understand these instructions.  Will watch your condition.  Will get help right  away if you are not doing well or get worse. Document Released: 06/18/2010 Document Revised: 03/14/2011 Document Reviewed: 06/18/2010 [facility] Patient Information 2015 [place], [place]. This information is not intended to replace advice given to you by your health care provider. Make sure you discuss any questions you have with your health care provider.

## 2013-10-23 ENCOUNTER — Encounter (INDEPENDENT_AMBULATORY_CARE_PROVIDER_SITE_OTHER): Payer: Self-pay | Admitting: *Deleted

## 2013-10-23 ENCOUNTER — Other Ambulatory Visit (INDEPENDENT_AMBULATORY_CARE_PROVIDER_SITE_OTHER): Payer: Self-pay | Admitting: *Deleted

## 2013-10-23 DIAGNOSIS — K754 Autoimmune hepatitis: Secondary | ICD-10-CM

## 2013-10-23 DIAGNOSIS — R748 Abnormal levels of other serum enzymes: Secondary | ICD-10-CM

## 2013-10-23 NOTE — Telephone Encounter (Signed)
Stool is heme positive. Will discuss with patient's daughter if interested in pursuing with EGD

## 2013-10-31 NOTE — Progress Notes (Signed)
-  Rescheduled-  Eileen Santana  

## 2013-11-04 ENCOUNTER — Encounter (HOSPITAL_COMMUNITY): Payer: Medicare Other

## 2013-11-04 ENCOUNTER — Ambulatory Visit (HOSPITAL_COMMUNITY): Payer: Medicare Other | Admitting: Oncology

## 2013-11-04 ENCOUNTER — Other Ambulatory Visit (HOSPITAL_COMMUNITY): Payer: Medicare Other

## 2013-11-05 NOTE — Progress Notes (Signed)
Eileen Courier, MD [address] [place] [address]  Iron deficiency anemia due to chronic blood loss  Elevated liver enzymes - Plan: Magnesium, Hepatic function panel, Magnesium, Hepatic function panel  Other pancytopenia  Rheumatoid arthritis  Autoimmune hepatitis  Thrombocytopenia  UGI bleed  Gastric AVM  AVM (congenital arteriovenous malformation)  CURRENT THERAPY: Intermittent IV Ferhame PRN and Aranesp 500 mcg every 21 days for Hgb 11 g/dL or lower  INTERVAL HISTORY: Eileen Santana 80 y.o. female returns for  regular  visit for followup of anemia of chronic disease with chronic renal failure and iron deficiency with blood loss secondary to intestinal AV malformations, chronic kidney disease, hypersplenism and also depletion of iron stores by the use of Aranesp  I personally reviewed and went over laboratory results with the patient.  The results are noted within this dictation.   She was recently seen in the [facility] ED on 10/20/2013 for a cough.  She was evaluated by ED physician and discharged without hospitalization.  Also, she has been following up with Dr. Karilyn Cota as directed.  Fecal occult stool testing was positive for blood in the stool.  Therefore, he recommends EGD.    From a hematologic standpoint, she is doing well.  She denies visibly seeing blood in her stool or black tarry stool.  She also denies hematuria.    She has some questions about her prednisone dose and I have deferred this to Dr. Karilyn Cota who is managing this medication.  Hematologically she denies any complaints and ROS questioning is negative.  Past Medical History  Diagnosis Date  . History of GI bleed     Associated with NSAIDS  . Iron deficiency anemia     Transfusion dependent  . DJD (degenerative joint disease)   . Essential hypertension, benign   . History of colitis   . Ileitis   . Pancytopenia   . Autoimmune hepatitis     With leukocytoclastic vasculitis  . Heart block AV  second degree March 2013  . Bronchitis   . Steroid-induced myopathy   . Renal calculus 08/12/2011  . Rheumatoid arthritis(714.0)   . Colon ulcer 04/2010    NSAID related  . Gastric ulcer 04/2010    NSAID related  . Cancer   . UGI bleed 09/20/2011    Focal area of gastritis oozing of blood  . Gastric AVM 09/20/2011  . Candida esophagitis 09/20/2011  . Paroxysmal atrial fibrillation   . Pericarditis     2D Echo EF 65%-70%  . Chronic kidney disease     Probably stage II to III with a recent acute exacerbation  . Cholestatic hepatitis   . UTI (lower urinary tract infection)     history    has Other pancytopenia; Sedimentation rate elevation; Elevated liver enzymes; GI bleed; Microcytic anemia; Orthostatic hypotension; Bradycardia; Renal failure; Autoimmune hepatitis; HTN (hypertension), benign; Abdominal pain, acute, generalized; Nausea; Diarrhea; Hypomagnesemia; Hypokalemia; Prolonged Q-T interval on ECG; UTI (urinary tract infection); Gallstones; Thrombocytopenia; Renal calculus; Chronic back pain; Leg pain; Rheumatoid arthritis; Acute renal insufficiency; Hypoalbuminemia; Guaiac positive stools; UGI bleed; Gastric AVM; Candida esophagitis; Anemia; PAT (paroxysmal atrial tachycardia); Hypertrophic cardiomyopathy; Hypersplenism; Iron deficiency anemia due to chronic blood loss; AVM (congenital arteriovenous malformation); Atrial tachycardia; and Atrial flutter on her problem list.     is allergic to iohexol; ivp dye; naprosyn; red blood cells; verapamil; vicodin; and sulfa antibiotics.  Eileen Santana had no medications administered during this visit.  Past Surgical History  Procedure  Laterality Date  . Colonoscopy  04/2010  . Upper gastrointestinal endoscopy  04/2010  . Esophagogastroduodenoscopy  09/20/2011    Status post APC.  Marland Kitchen Esophagogastroduodenoscopy  09/20/2011    Procedure: ESOPHAGOGASTRODUODENOSCOPY (EGD);  Surgeon: Malissa Hippo, MD;  Location: [facility];  Service: Endoscopy;   Laterality: N/A;  . Cyst removal hand      Elbow    Denies any headaches, dizziness, double vision, fevers, chills, night sweats, nausea, vomiting, diarrhea, constipation, chest pain, heart palpitations, shortness of breath, blood in stool, black tarry stool, urinary pain, urinary burning, urinary frequency, hematuria.   PHYSICAL EXAMINATION  ECOG PERFORMANCE STATUS: 1 - Symptomatic but completely ambulatory  Filed Vitals:   11/07/13 1204  BP: 144/88  Pulse: 76  Temp: 97.9 F (36.6 C)  Resp: 20    GENERAL:alert, no distress, comfortable, cooperative and smiling SKIN: skin color, texture, turgor are normal, no rashes or significant lesions HEAD: Normocephalic, No masses, lesions, tenderness or abnormalities EYES: normal, PERRLA, EOMI, Conjunctiva are pink and non-injected EARS: External ears normal OROPHARYNX:mucous membranes are moist  NECK: supple, trachea midline LYMPH:  no palpable lymphadenopathy BREAST:not examined LUNGS: clear to auscultation  HEART: regular rate & rhythm ABDOMEN:abdomen soft and normal bowel sounds BACK: Back symmetric, no curvature. EXTREMITIES:less then 2 second capillary refill, no joint deformities, effusion, or inflammation, no skin discoloration, no cyanosis  NEURO: alert & oriented x 3 with fluent speech, no focal motor/sensory deficits, gait normal   LABORATORY DATA: CBC    Component Value Date/Time   WBC 3.7* 11/07/2013 1156   RBC 3.96 11/07/2013 1156   RBC 3.62* 03/16/2012 0937   HGB 11.4* 11/07/2013 1156   HCT 33.7* 11/07/2013 1156   PLT 95* 11/07/2013 1156   MCV 85.1 11/07/2013 1156   MCH 28.8 11/07/2013 1156   MCHC 33.8 11/07/2013 1156   RDW 19.5* 11/07/2013 1156   LYMPHSABS 0.5* 11/07/2013 1156   MONOABS 0.3 11/07/2013 1156   EOSABS 0.1 11/07/2013 1156   BASOSABS 0.0 11/07/2013 1156      Chemistry      Component Value Date/Time   NA 139 08/12/2013 1042   K 3.8 08/12/2013 1042   CL 104 08/12/2013 1042   CO2 28  08/12/2013 1042   BUN 20 08/12/2013 1042   CREATININE 1.04 08/12/2013 1042   CREATININE 1.19* 06/13/2013 0514      Component Value Date/Time   CALCIUM 10.5 08/12/2013 1042   ALKPHOS 60 10/14/2013 1038   AST 62* 10/14/2013 1038   ALT 10 10/14/2013 1038   BILITOT 1.1 10/14/2013 1038     Lab Results  Component Value Date   IRON 25* 09/24/2013   TIBC 325 09/24/2013   FERRITIN 31 09/24/2013      ASSESSMENT:  1. Mixed anemia with contributions from chronic kidney disease, chronic blood loss due to AVMs, and hypersplenism with bone marrow done in July of 2014 showing no evidence of myelodysplasia or malignancy but with absent iron stores.  2. 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  3. Chronic kidney disease, stage III.  4. Second-degree heart block with recent episode of atrial flutter, treated medically with no intent to use anticoagulants in the future 5. GI AVMs   Patient Active Problem List   Diagnosis Date Noted  . Atrial flutter 06/12/2013  . Atrial tachycardia 06/11/2013  . AVM (congenital arteriovenous malformation) 10/12/2012  . Hypersplenism 09/21/2012  . Iron deficiency anemia due to chronic blood loss 09/21/2012  . PAT (paroxysmal  atrial tachycardia) 05/23/2012  . Hypertrophic cardiomyopathy 05/23/2012  . Anemia 11/25/2011  . UGI bleed 09/20/2011  . Gastric AVM 09/20/2011  . Candida esophagitis 09/20/2011  . Chronic back pain 09/18/2011  . Leg pain 09/18/2011  . Rheumatoid arthritis 09/18/2011  . Acute renal insufficiency 09/18/2011  . Hypoalbuminemia 09/18/2011  . Guaiac positive stools 09/18/2011  . Renal calculus 08/12/2011  . Abdominal pain, acute, generalized 08/10/2011  . Nausea 08/10/2011  . Diarrhea 08/10/2011  . Hypomagnesemia 08/10/2011  . Hypokalemia 08/10/2011  . Prolonged Q-T interval on ECG 08/10/2011  . UTI (urinary tract infection) 08/10/2011  . Gallstones 08/10/2011  . Thrombocytopenia 08/10/2011  .  Orthostatic hypotension 03/05/2011  . Bradycardia 03/05/2011  . Renal failure 03/05/2011  . Autoimmune hepatitis 03/05/2011  . HTN (hypertension), benign 03/05/2011  . Elevated liver enzymes 02/17/2011  . GI bleed 02/17/2011  . Microcytic anemia 02/17/2011  . Other pancytopenia 09/03/2010  . Sedimentation rate elevation 09/03/2010     PLAN:  1. I personally reviewed and went over laboratory results with the patient.  The results are noted within this dictation. 2. I personally reviewed and went over radiographic studies with the patient.  The results are noted within this dictation.   3. Labs today: CBC diff, Ferritin 4. Follow-up with GI as directed 5. Supportive therapy plan reviewed 6. Labs every 3 weeks: CBC diff 7. Labs in 12 week: ferritin 8. Added magnesium and LFT testing per Dr. Patty Sermons past dictation. 9. Defer Prednisone dosing to GI 10. Return in 12 weeks for follow-up   THERAPY PLAN:  We will continue to support counts with ESA therapy and provide adequate IV Feraheme when needed.   All questions were answered. The patient knows to call the clinic with any problems, questions or concerns. We can certainly see the patient much sooner if necessary.  Patient and plan discussed with Dr. Alla German and he is in agreement with the aforementioned.   KEFALAS,THOMAS 11/07/2013

## 2013-11-07 ENCOUNTER — Encounter (HOSPITAL_COMMUNITY): Payer: Self-pay | Admitting: Oncology

## 2013-11-07 ENCOUNTER — Encounter (HOSPITAL_COMMUNITY): Payer: Medicare Other

## 2013-11-07 ENCOUNTER — Encounter (HOSPITAL_BASED_OUTPATIENT_CLINIC_OR_DEPARTMENT_OTHER): Payer: Medicare Other

## 2013-11-07 ENCOUNTER — Encounter (HOSPITAL_COMMUNITY): Payer: Medicare Other | Attending: Oncology | Admitting: Oncology

## 2013-11-07 VITALS — BP 144/88 | HR 76 | Temp 97.9°F | Resp 20 | Wt 149.4 lb

## 2013-11-07 DIAGNOSIS — D61818 Other pancytopenia: Secondary | ICD-10-CM

## 2013-11-07 DIAGNOSIS — K922 Gastrointestinal hemorrhage, unspecified: Secondary | ICD-10-CM

## 2013-11-07 DIAGNOSIS — D631 Anemia in chronic kidney disease: Secondary | ICD-10-CM

## 2013-11-07 DIAGNOSIS — D500 Iron deficiency anemia secondary to blood loss (chronic): Secondary | ICD-10-CM | POA: Insufficient documentation

## 2013-11-07 DIAGNOSIS — N289 Disorder of kidney and ureter, unspecified: Secondary | ICD-10-CM

## 2013-11-07 DIAGNOSIS — K754 Autoimmune hepatitis: Secondary | ICD-10-CM

## 2013-11-07 DIAGNOSIS — D696 Thrombocytopenia, unspecified: Secondary | ICD-10-CM

## 2013-11-07 DIAGNOSIS — D649 Anemia, unspecified: Secondary | ICD-10-CM

## 2013-11-07 DIAGNOSIS — D509 Iron deficiency anemia, unspecified: Secondary | ICD-10-CM | POA: Insufficient documentation

## 2013-11-07 DIAGNOSIS — N183 Chronic kidney disease, stage 3 (moderate): Secondary | ICD-10-CM

## 2013-11-07 DIAGNOSIS — R748 Abnormal levels of other serum enzymes: Secondary | ICD-10-CM

## 2013-11-07 DIAGNOSIS — Q2730 Arteriovenous malformation, site unspecified: Secondary | ICD-10-CM

## 2013-11-07 DIAGNOSIS — K31819 Angiodysplasia of stomach and duodenum without bleeding: Secondary | ICD-10-CM

## 2013-11-07 DIAGNOSIS — M069 Rheumatoid arthritis, unspecified: Secondary | ICD-10-CM

## 2013-11-07 LAB — HEPATIC FUNCTION PANEL
ALK PHOS: 62 U/L (ref 39–117)
ALT: 11 U/L (ref 0–35)
AST: 63 U/L — AB (ref 0–37)
Albumin: 2.7 g/dL — ABNORMAL LOW (ref 3.5–5.2)
Bilirubin, Direct: 0.2 mg/dL (ref 0.0–0.3)
Indirect Bilirubin: 0.7 mg/dL (ref 0.3–0.9)
Total Bilirubin: 0.9 mg/dL (ref 0.3–1.2)
Total Protein: 7 g/dL (ref 6.0–8.3)

## 2013-11-07 LAB — CBC WITH DIFFERENTIAL/PLATELET
Basophils Absolute: 0 10*3/uL (ref 0.0–0.1)
Basophils Relative: 1 % (ref 0–1)
Eosinophils Absolute: 0.1 10*3/uL (ref 0.0–0.7)
Eosinophils Relative: 2 % (ref 0–5)
HCT: 33.7 % — ABNORMAL LOW (ref 36.0–46.0)
HEMOGLOBIN: 11.4 g/dL — AB (ref 12.0–15.0)
LYMPHS ABS: 0.5 10*3/uL — AB (ref 0.7–4.0)
Lymphocytes Relative: 13 % (ref 12–46)
MCH: 28.8 pg (ref 26.0–34.0)
MCHC: 33.8 g/dL (ref 30.0–36.0)
MCV: 85.1 fL (ref 78.0–100.0)
MONO ABS: 0.3 10*3/uL (ref 0.1–1.0)
MONOS PCT: 8 % (ref 3–12)
NEUTROS PCT: 75 % (ref 43–77)
Neutro Abs: 2.8 10*3/uL (ref 1.7–7.7)
Platelets: 95 10*3/uL — ABNORMAL LOW (ref 150–400)
RBC: 3.96 MIL/uL (ref 3.87–5.11)
RDW: 19.5 % — ABNORMAL HIGH (ref 11.5–15.5)
Smear Review: DECREASED
WBC: 3.7 10*3/uL — ABNORMAL LOW (ref 4.0–10.5)

## 2013-11-07 LAB — MAGNESIUM: MAGNESIUM: 1.3 mg/dL — AB (ref 1.5–2.5)

## 2013-11-07 LAB — FERRITIN: Ferritin: 236 ng/mL (ref 10–291)

## 2013-11-07 NOTE — Patient Instructions (Signed)
[facility] Discharge Instructions  RECOMMENDATIONS MADE BY THE CONSULTANT AND ANY TEST RESULTS WILL BE SENT TO YOUR REFERRING PHYSICIAN.  Hemoglobin is 11.4 g/dL today.  No need for an injection of Aranesp today.  Hemoglobin is very good. Lab work in 3 weeks and every 3 weeks.  If Hemoglobin is 11 or less, we will give you the injection of Aranesp. We will keep an eye on your iron level as well.  Iron level from today is pending.  We will get that result tomorrow (11/6).  If you need an iron infusion, we will let you know and set it up according to your busy schedule. I look forward to playing you in Bocce ball some day soon; maybe when the weather is better.    Thank you for choosing [facility] to provide your oncology and hematology care.  To afford each patient quality time with our providers, please arrive at least 15 minutes before your scheduled appointment time.  With your help, our goal is to use those 15 minutes to complete the necessary work-up to ensure our physicians have the information they need to help with your evaluation and healthcare recommendations.    Effective January 1st, 2014, we ask that you re-schedule your appointment with our physicians should you arrive 10 or more minutes late for your appointment.  We strive to give you quality time with our providers, and arriving late affects you and other patients whose appointments are after yours.    Again, thank you for choosing [facility].  Our hope is that these requests will decrease the amount of time that you wait before being seen by our physicians.       _____________________________________________________________  Should you have questions after your visit to [facility], please contact our office at [phone] between the hours of 8:30 a.m. and 5:00 p.m.  Voicemails left after 4:30 p.m. will not be returned until the following business day.  For  prescription refill requests, have your pharmacy contact our office with your prescription refill request.

## 2013-11-07 NOTE — Progress Notes (Signed)
Hemoglobin 11.4 today. Aranesp not given. Treatment parameters not met. Return in 21 days as scheduled.

## 2013-11-07 NOTE — Progress Notes (Signed)
LABS FOR FERR,CBCD,MG,HFP

## 2013-11-12 ENCOUNTER — Other Ambulatory Visit (INDEPENDENT_AMBULATORY_CARE_PROVIDER_SITE_OTHER): Payer: Self-pay | Admitting: Internal Medicine

## 2013-11-12 ENCOUNTER — Other Ambulatory Visit: Payer: Self-pay | Admitting: Adult Health

## 2013-11-13 ENCOUNTER — Telehealth (INDEPENDENT_AMBULATORY_CARE_PROVIDER_SITE_OTHER): Payer: Self-pay | Admitting: *Deleted

## 2013-11-13 DIAGNOSIS — K754 Autoimmune hepatitis: Secondary | ICD-10-CM

## 2013-11-13 DIAGNOSIS — R790 Abnormal level of blood mineral: Secondary | ICD-10-CM

## 2013-11-13 NOTE — Telephone Encounter (Signed)
Per Dr.Rehman the patient will need to have labs drawn in 4 weeks. Lab noted for 12-11-13.

## 2013-11-15 ENCOUNTER — Other Ambulatory Visit (INDEPENDENT_AMBULATORY_CARE_PROVIDER_SITE_OTHER): Payer: Self-pay | Admitting: Internal Medicine

## 2013-11-20 ENCOUNTER — Other Ambulatory Visit (INDEPENDENT_AMBULATORY_CARE_PROVIDER_SITE_OTHER): Payer: Self-pay | Admitting: *Deleted

## 2013-11-20 ENCOUNTER — Encounter (INDEPENDENT_AMBULATORY_CARE_PROVIDER_SITE_OTHER): Payer: Self-pay | Admitting: *Deleted

## 2013-11-20 DIAGNOSIS — K754 Autoimmune hepatitis: Secondary | ICD-10-CM

## 2013-11-20 DIAGNOSIS — R790 Abnormal level of blood mineral: Secondary | ICD-10-CM

## 2013-11-26 ENCOUNTER — Other Ambulatory Visit (HOSPITAL_COMMUNITY): Payer: Self-pay

## 2013-11-26 DIAGNOSIS — D500 Iron deficiency anemia secondary to blood loss (chronic): Secondary | ICD-10-CM

## 2013-11-26 DIAGNOSIS — D61818 Other pancytopenia: Secondary | ICD-10-CM

## 2013-12-02 ENCOUNTER — Encounter (HOSPITAL_COMMUNITY): Payer: Medicare Other

## 2013-12-02 ENCOUNTER — Encounter (HOSPITAL_BASED_OUTPATIENT_CLINIC_OR_DEPARTMENT_OTHER): Payer: Medicare Other

## 2013-12-02 ENCOUNTER — Encounter (HOSPITAL_COMMUNITY): Payer: Self-pay

## 2013-12-02 DIAGNOSIS — D61818 Other pancytopenia: Secondary | ICD-10-CM

## 2013-12-02 DIAGNOSIS — N289 Disorder of kidney and ureter, unspecified: Secondary | ICD-10-CM

## 2013-12-02 DIAGNOSIS — D500 Iron deficiency anemia secondary to blood loss (chronic): Secondary | ICD-10-CM

## 2013-12-02 DIAGNOSIS — D649 Anemia, unspecified: Secondary | ICD-10-CM

## 2013-12-02 DIAGNOSIS — D631 Anemia in chronic kidney disease: Secondary | ICD-10-CM

## 2013-12-02 DIAGNOSIS — N189 Chronic kidney disease, unspecified: Secondary | ICD-10-CM

## 2013-12-02 LAB — CBC WITH DIFFERENTIAL/PLATELET
BASOS ABS: 0 10*3/uL (ref 0.0–0.1)
Basophils Relative: 2 % — ABNORMAL HIGH (ref 0–1)
EOS PCT: 4 % (ref 0–5)
Eosinophils Absolute: 0.1 10*3/uL (ref 0.0–0.7)
HEMATOCRIT: 30.8 % — AB (ref 36.0–46.0)
Hemoglobin: 10.4 g/dL — ABNORMAL LOW (ref 12.0–15.0)
LYMPHS PCT: 16 % (ref 12–46)
Lymphs Abs: 0.4 10*3/uL — ABNORMAL LOW (ref 0.7–4.0)
MCH: 28.9 pg (ref 26.0–34.0)
MCHC: 33.8 g/dL (ref 30.0–36.0)
MCV: 85.6 fL (ref 78.0–100.0)
MONOS PCT: 20 % — AB (ref 3–12)
Monocytes Absolute: 0.5 10*3/uL (ref 0.1–1.0)
NEUTROS ABS: 1.4 10*3/uL — AB (ref 1.7–7.7)
Neutrophils Relative %: 58 % (ref 43–77)
Platelets: 115 10*3/uL — ABNORMAL LOW (ref 150–400)
RBC: 3.6 MIL/uL — AB (ref 3.87–5.11)
RDW: 19 % — ABNORMAL HIGH (ref 11.5–15.5)
WBC: 2.4 10*3/uL — AB (ref 4.0–10.5)

## 2013-12-02 LAB — HEPATIC FUNCTION PANEL
ALBUMIN: 2.6 g/dL — AB (ref 3.5–5.2)
ALK PHOS: 73 U/L (ref 39–117)
ALT: 14 U/L (ref 0–35)
AST: 71 U/L — AB (ref 0–37)
Bilirubin, Direct: 0.3 mg/dL (ref 0.0–0.3)
Indirect Bilirubin: 0.6 mg/dL (ref 0.3–0.9)
Total Bilirubin: 0.9 mg/dL (ref 0.3–1.2)
Total Protein: 6.6 g/dL (ref 6.0–8.3)

## 2013-12-02 LAB — MAGNESIUM: Magnesium: 1.5 mg/dL (ref 1.5–2.5)

## 2013-12-02 MED ORDER — DARBEPOETIN ALFA 500 MCG/ML IJ SOSY
500.0000 ug | PREFILLED_SYRINGE | INTRAMUSCULAR | Status: DC
  Administered 2013-12-02: 500 ug via SUBCUTANEOUS

## 2013-12-02 NOTE — Progress Notes (Signed)
See treatment encounter.

## 2013-12-02 NOTE — Addendum Note (Signed)
Addended by: Elane Fritz on: 12/02/2013 02:35 PM   Modules accepted: Orders

## 2013-12-02 NOTE — Progress Notes (Signed)
1405:  Eileen Santana presents today for injection per the provider's orders.  Aranesp administration without incident; see MAR for injection details.  Patient tolerated procedure well and without incident.  No questions or complaints noted at this time.

## 2013-12-02 NOTE — Progress Notes (Signed)
Eileen Santana's reason for visit today is for labs as scheduled per MD orders.  Venipuncture performed with a 23 gauge butterfly needle to R Antecubital.  [name] tolerated procedure well and without incident; questions were answered and patient was discharged.

## 2013-12-02 NOTE — Patient Instructions (Signed)
[facility] Discharge Instructions  RECOMMENDATIONS MADE BY THE CONSULTANT AND ANY TEST RESULTS WILL BE SENT TO YOUR REFERRING PHYSICIAN.  Today you received Aranesp injection (for hemoglobin of 10.4). Return as scheduled for lab work, injections, and office visit.   Thank you for choosing [facility] to provide your oncology and hematology care.  To afford each patient quality time with our providers, please arrive at least 15 minutes before your scheduled appointment time.  With your help, our goal is to use those 15 minutes to complete the necessary work-up to ensure our physicians have the information they need to help with your evaluation and healthcare recommendations.    Effective January 1st, 2014, we ask that you re-schedule your appointment with our physicians should you arrive 10 or more minutes late for your appointment.  We strive to give you quality time with our providers, and arriving late affects you and other patients whose appointments are after yours.    Again, thank you for choosing [facility].  Our hope is that these requests will decrease the amount of time that you wait before being seen by our physicians.       _____________________________________________________________  Should you have questions after your visit to [facility], please contact our office at [phone] between the hours of 8:30 a.m. and 4:30 p.m.  Voicemails left after 4:30 p.m. will not be returned until the following business day.  For prescription refill requests, have your pharmacy contact our office with your prescription refill request.    _______________________________________________________________  We hope that we have given you very good care.  You may receive a patient satisfaction survey in the mail, please complete it and return it as soon as possible.  We value your  feedback!  _______________________________________________________________  Have you asked about our STAR program?  STAR stands for Survivorship Training and Rehabilitation, and this is a nationally recognized cancer care program that focuses on survivorship and rehabilitation.  Cancer and cancer treatments may cause problems, such as, pain, making you feel tired and keeping you from doing the things that you need or want to do. Cancer rehabilitation can help. Our goal is to reduce these troubling effects and help you have the best quality of life possible.  You may receive a survey from a nurse that asks questions about your current state of health.  Based on the survey results, all eligible patients will be referred to the [facility] program for an evaluation so we can better serve you!  A frequently asked questions sheet is available upon request.

## 2013-12-03 DIAGNOSIS — Z8701 Personal history of pneumonia (recurrent): Secondary | ICD-10-CM

## 2013-12-03 HISTORY — DX: Personal history of pneumonia (recurrent): Z87.01

## 2013-12-10 ENCOUNTER — Telehealth (INDEPENDENT_AMBULATORY_CARE_PROVIDER_SITE_OTHER): Payer: Self-pay | Admitting: *Deleted

## 2013-12-10 DIAGNOSIS — D509 Iron deficiency anemia, unspecified: Secondary | ICD-10-CM

## 2013-12-10 DIAGNOSIS — K754 Autoimmune hepatitis: Secondary | ICD-10-CM

## 2013-12-10 NOTE — Telephone Encounter (Signed)
Per Dr.Rehman the patient will need to have labs drawn in 1 month. 

## 2013-12-11 ENCOUNTER — Other Ambulatory Visit (INDEPENDENT_AMBULATORY_CARE_PROVIDER_SITE_OTHER): Payer: Self-pay | Admitting: Internal Medicine

## 2013-12-12 ENCOUNTER — Telehealth (INDEPENDENT_AMBULATORY_CARE_PROVIDER_SITE_OTHER): Payer: Self-pay | Admitting: *Deleted

## 2013-12-12 ENCOUNTER — Other Ambulatory Visit: Payer: Self-pay | Admitting: Adult Health

## 2013-12-12 NOTE — Telephone Encounter (Signed)
Eileen Santana called back and said she was needing spironolactone also.

## 2013-12-12 NOTE — Telephone Encounter (Addendum)
Eileen Santana said Dr. Karilyn Cota upped her Prednisone from 5 mg to 10 mg. She is needing a new Rx sent in to her pharmacy. Also please make sure her blood work is in the computer for 12/23/13. This is went she will be going to the hospital for blood work. Dr. Karilyn Cota mentioned to her that he will need lab work.  She uses [facility] - [place], [place] - [address] AT [address]

## 2013-12-13 ENCOUNTER — Other Ambulatory Visit (INDEPENDENT_AMBULATORY_CARE_PROVIDER_SITE_OTHER): Payer: Self-pay | Admitting: *Deleted

## 2013-12-13 MED ORDER — PREDNISONE 10 MG PO TABS: 10.0000 mg | ORAL_TABLET | Freq: Every day | ORAL | Status: DC

## 2013-12-13 NOTE — Telephone Encounter (Signed)
Prescriptions have been sent to the patient's pharmacy. 

## 2013-12-13 NOTE — Telephone Encounter (Signed)
Per Dr.Rehman may write for 10 mg #90 with 3 refills.

## 2013-12-23 ENCOUNTER — Encounter (HOSPITAL_BASED_OUTPATIENT_CLINIC_OR_DEPARTMENT_OTHER): Payer: Medicare Other

## 2013-12-23 ENCOUNTER — Encounter (HOSPITAL_COMMUNITY): Payer: Medicare Other | Attending: Oncology

## 2013-12-23 ENCOUNTER — Encounter (HOSPITAL_COMMUNITY): Payer: Self-pay

## 2013-12-23 DIAGNOSIS — Q2730 Arteriovenous malformation, site unspecified: Secondary | ICD-10-CM

## 2013-12-23 DIAGNOSIS — D500 Iron deficiency anemia secondary to blood loss (chronic): Secondary | ICD-10-CM | POA: Insufficient documentation

## 2013-12-23 DIAGNOSIS — N189 Chronic kidney disease, unspecified: Principal | ICD-10-CM

## 2013-12-23 DIAGNOSIS — K31819 Angiodysplasia of stomach and duodenum without bleeding: Secondary | ICD-10-CM

## 2013-12-23 DIAGNOSIS — N289 Disorder of kidney and ureter, unspecified: Secondary | ICD-10-CM | POA: Insufficient documentation

## 2013-12-23 DIAGNOSIS — D631 Anemia in chronic kidney disease: Secondary | ICD-10-CM

## 2013-12-23 DIAGNOSIS — D649 Anemia, unspecified: Secondary | ICD-10-CM | POA: Diagnosis present

## 2013-12-23 DIAGNOSIS — D61818 Other pancytopenia: Secondary | ICD-10-CM | POA: Insufficient documentation

## 2013-12-23 DIAGNOSIS — D509 Iron deficiency anemia, unspecified: Secondary | ICD-10-CM | POA: Diagnosis present

## 2013-12-23 DIAGNOSIS — N183 Chronic kidney disease, stage 3 (moderate): Secondary | ICD-10-CM

## 2013-12-23 LAB — BASIC METABOLIC PANEL
ANION GAP: 14 (ref 5–15)
BUN: 18 mg/dL (ref 6–23)
CALCIUM: 10.1 mg/dL (ref 8.4–10.5)
CO2: 26 meq/L (ref 19–32)
Chloride: 100 mEq/L (ref 96–112)
Creatinine, Ser: 1.43 mg/dL — ABNORMAL HIGH (ref 0.50–1.10)
GFR calc Af Amer: 39 mL/min — ABNORMAL LOW (ref >90)
GFR calc non Af Amer: 33 mL/min — ABNORMAL LOW (ref >90)
Glucose, Bld: 130 mg/dL — ABNORMAL HIGH (ref 70–99)
Potassium: 4.3 mEq/L (ref 3.7–5.3)
Sodium: 140 mEq/L (ref 137–147)

## 2013-12-23 LAB — HEPATIC FUNCTION PANEL
ALK PHOS: 61 U/L (ref 39–117)
ALT: 12 U/L (ref 0–35)
AST: 61 U/L — ABNORMAL HIGH (ref 0–37)
Albumin: 2.5 g/dL — ABNORMAL LOW (ref 3.5–5.2)
BILIRUBIN DIRECT: 0.3 mg/dL (ref 0.0–0.3)
BILIRUBIN INDIRECT: 0.7 mg/dL (ref 0.3–0.9)
Total Bilirubin: 1 mg/dL (ref 0.3–1.2)
Total Protein: 6.8 g/dL (ref 6.0–8.3)

## 2013-12-23 LAB — CBC WITH DIFFERENTIAL/PLATELET
Basophils Absolute: 0 10*3/uL (ref 0.0–0.1)
Basophils Relative: 1 % (ref 0–1)
EOS PCT: 2 % (ref 0–5)
Eosinophils Absolute: 0.1 10*3/uL (ref 0.0–0.7)
HCT: 30.4 % — ABNORMAL LOW (ref 36.0–46.0)
Hemoglobin: 10 g/dL — ABNORMAL LOW (ref 12.0–15.0)
LYMPHS ABS: 0.4 10*3/uL — AB (ref 0.7–4.0)
LYMPHS PCT: 8 % — AB (ref 12–46)
MCH: 27.7 pg (ref 26.0–34.0)
MCHC: 32.9 g/dL (ref 30.0–36.0)
MCV: 84.2 fL (ref 78.0–100.0)
MONO ABS: 0.4 10*3/uL (ref 0.1–1.0)
Monocytes Relative: 8 % (ref 3–12)
Neutro Abs: 4 10*3/uL (ref 1.7–7.7)
Neutrophils Relative %: 81 % — ABNORMAL HIGH (ref 43–77)
Platelets: 118 10*3/uL — ABNORMAL LOW (ref 150–400)
RBC: 3.61 MIL/uL — AB (ref 3.87–5.11)
RDW: 18.2 % — ABNORMAL HIGH (ref 11.5–15.5)
WBC: 5 10*3/uL (ref 4.0–10.5)

## 2013-12-23 MED ORDER — DARBEPOETIN ALFA 500 MCG/ML IJ SOSY
PREFILLED_SYRINGE | INTRAMUSCULAR | Status: AC
  Filled 2013-12-23: qty 1

## 2013-12-23 MED ORDER — DARBEPOETIN ALFA 500 MCG/ML IJ SOSY
500.0000 ug | PREFILLED_SYRINGE | INTRAMUSCULAR | Status: DC
  Administered 2013-12-23: 500 ug via SUBCUTANEOUS

## 2013-12-23 NOTE — Patient Instructions (Signed)
[facility] Discharge Instructions  RECOMMENDATIONS MADE BY THE CONSULTANT AND ANY TEST RESULTS WILL BE SENT TO YOUR REFERRING PHYSICIAN.  Today you received Aranesp (hemoglobin 10). Return as scheduled for labs, injections, and office visit.   Thank you for choosing [facility] to provide your oncology and hematology care.  To afford each patient quality time with our providers, please arrive at least 15 minutes before your scheduled appointment time.  With your help, our goal is to use those 15 minutes to complete the necessary work-up to ensure our physicians have the information they need to help with your evaluation and healthcare recommendations.    Effective January 1st, 2014, we ask that you re-schedule your appointment with our physicians should you arrive 10 or more minutes late for your appointment.  We strive to give you quality time with our providers, and arriving late affects you and other patients whose appointments are after yours.    Again, thank you for choosing [facility].  Our hope is that these requests will decrease the amount of time that you wait before being seen by our physicians.       _____________________________________________________________  Should you have questions after your visit to [facility], please contact our office at [phone] between the hours of 8:30 a.m. and 4:30 p.m.  Voicemails left after 4:30 p.m. will not be returned until the following business day.  For prescription refill requests, have your pharmacy contact our office with your prescription refill request.    _______________________________________________________________  We hope that we have given you very good care.  You may receive a patient satisfaction survey in the mail, please complete it and return it as soon as possible.  We value your  feedback!  _______________________________________________________________  Have you asked about our STAR program?  STAR stands for Survivorship Training and Rehabilitation, and this is a nationally recognized cancer care program that focuses on survivorship and rehabilitation.  Cancer and cancer treatments may cause problems, such as, pain, making you feel tired and keeping you from doing the things that you need or want to do. Cancer rehabilitation can help. Our goal is to reduce these troubling effects and help you have the best quality of life possible.  You may receive a survey from a nurse that asks questions about your current state of health.  Based on the survey results, all eligible patients will be referred to the [facility] program for an evaluation so we can better serve you!  A frequently asked questions sheet is available upon request.

## 2013-12-23 NOTE — Progress Notes (Signed)
Eileen Santana presents today for injection per the provider's orders.  Aranesp administration without incident; see MAR for injection details.  Patient tolerated procedure well and without incident.  No questions or complaints noted at this time.  

## 2013-12-23 NOTE — Progress Notes (Signed)
Labs for cbcd,ferr, and hfp for dr Kelvin Cellar

## 2013-12-24 ENCOUNTER — Other Ambulatory Visit (HOSPITAL_COMMUNITY): Payer: Self-pay | Admitting: Oncology

## 2013-12-24 DIAGNOSIS — D500 Iron deficiency anemia secondary to blood loss (chronic): Secondary | ICD-10-CM

## 2013-12-24 DIAGNOSIS — Q2730 Arteriovenous malformation, site unspecified: Secondary | ICD-10-CM

## 2013-12-24 DIAGNOSIS — K922 Gastrointestinal hemorrhage, unspecified: Secondary | ICD-10-CM

## 2013-12-24 LAB — FERRITIN: FERRITIN: 36 ng/mL (ref 10–291)

## 2013-12-29 ENCOUNTER — Emergency Department (HOSPITAL_COMMUNITY): Payer: Medicare Other

## 2013-12-29 ENCOUNTER — Encounter (HOSPITAL_COMMUNITY): Payer: Self-pay | Admitting: Emergency Medicine

## 2013-12-29 ENCOUNTER — Inpatient Hospital Stay (HOSPITAL_COMMUNITY)
Admission: EM | Admit: 2013-12-29 | Discharge: 2014-01-06 | DRG: 308 | Disposition: A | Discharge status: Home / Self Care | Payer: Medicare Other | Attending: Internal Medicine | Admitting: Internal Medicine

## 2013-12-29 DIAGNOSIS — I4891 Unspecified atrial fibrillation: Secondary | ICD-10-CM

## 2013-12-29 DIAGNOSIS — I4892 Unspecified atrial flutter: Principal | ICD-10-CM | POA: Diagnosis present

## 2013-12-29 DIAGNOSIS — D6959 Other secondary thrombocytopenia: Secondary | ICD-10-CM | POA: Diagnosis present

## 2013-12-29 DIAGNOSIS — Z7952 Long term (current) use of systemic steroids: Secondary | ICD-10-CM | POA: Diagnosis not present

## 2013-12-29 DIAGNOSIS — D61818 Other pancytopenia: Secondary | ICD-10-CM | POA: Diagnosis present

## 2013-12-29 DIAGNOSIS — J189 Pneumonia, unspecified organism: Secondary | ICD-10-CM | POA: Diagnosis present

## 2013-12-29 DIAGNOSIS — N183 Chronic kidney disease, stage 3 (moderate): Secondary | ICD-10-CM | POA: Diagnosis present

## 2013-12-29 DIAGNOSIS — I441 Atrioventricular block, second degree: Secondary | ICD-10-CM | POA: Diagnosis present

## 2013-12-29 DIAGNOSIS — R05 Cough: Secondary | ICD-10-CM | POA: Diagnosis present

## 2013-12-29 DIAGNOSIS — M1990 Unspecified osteoarthritis, unspecified site: Secondary | ICD-10-CM | POA: Diagnosis present

## 2013-12-29 DIAGNOSIS — Z79899 Other long term (current) drug therapy: Secondary | ICD-10-CM | POA: Diagnosis not present

## 2013-12-29 DIAGNOSIS — K754 Autoimmune hepatitis: Secondary | ICD-10-CM | POA: Diagnosis present

## 2013-12-29 DIAGNOSIS — D696 Thrombocytopenia, unspecified: Secondary | ICD-10-CM | POA: Diagnosis present

## 2013-12-29 DIAGNOSIS — J440 Chronic obstructive pulmonary disease with acute lower respiratory infection: Secondary | ICD-10-CM

## 2013-12-29 DIAGNOSIS — J449 Chronic obstructive pulmonary disease, unspecified: Secondary | ICD-10-CM | POA: Diagnosis not present

## 2013-12-29 DIAGNOSIS — I422 Other hypertrophic cardiomyopathy: Secondary | ICD-10-CM | POA: Diagnosis present

## 2013-12-29 DIAGNOSIS — Y95 Nosocomial condition: Secondary | ICD-10-CM | POA: Diagnosis present

## 2013-12-29 DIAGNOSIS — M069 Rheumatoid arthritis, unspecified: Secondary | ICD-10-CM | POA: Diagnosis present

## 2013-12-29 DIAGNOSIS — F17210 Nicotine dependence, cigarettes, uncomplicated: Secondary | ICD-10-CM | POA: Diagnosis present

## 2013-12-29 DIAGNOSIS — D638 Anemia in other chronic diseases classified elsewhere: Secondary | ICD-10-CM | POA: Diagnosis not present

## 2013-12-29 DIAGNOSIS — J438 Other emphysema: Secondary | ICD-10-CM | POA: Diagnosis not present

## 2013-12-29 DIAGNOSIS — I480 Paroxysmal atrial fibrillation: Secondary | ICD-10-CM | POA: Diagnosis present

## 2013-12-29 DIAGNOSIS — R059 Cough, unspecified: Secondary | ICD-10-CM

## 2013-12-29 DIAGNOSIS — I129 Hypertensive chronic kidney disease with stage 1 through stage 4 chronic kidney disease, or unspecified chronic kidney disease: Secondary | ICD-10-CM | POA: Diagnosis present

## 2013-12-29 HISTORY — DX: Hypomagnesemia: E83.42

## 2013-12-29 HISTORY — DX: Unspecified atrial flutter: I48.92

## 2013-12-29 HISTORY — DX: Supraventricular tachycardia: I47.1

## 2013-12-29 HISTORY — DX: Other supraventricular tachycardia: I47.19

## 2013-12-29 HISTORY — DX: Other hypertrophic cardiomyopathy: I42.2

## 2013-12-29 LAB — CBC WITH DIFFERENTIAL/PLATELET
BASOS PCT: 0 % (ref 0–1)
Basophils Absolute: 0 10*3/uL (ref 0.0–0.1)
EOS ABS: 0.1 10*3/uL (ref 0.0–0.7)
EOS PCT: 1 % (ref 0–5)
HCT: 31.1 % — ABNORMAL LOW (ref 36.0–46.0)
Hemoglobin: 10.3 g/dL — ABNORMAL LOW (ref 12.0–15.0)
LYMPHS ABS: 0.6 10*3/uL — AB (ref 0.7–4.0)
Lymphocytes Relative: 8 % — ABNORMAL LOW (ref 12–46)
MCH: 27.2 pg (ref 26.0–34.0)
MCHC: 33.1 g/dL (ref 30.0–36.0)
MCV: 82.3 fL (ref 78.0–100.0)
MONOS PCT: 7 % (ref 3–12)
Monocytes Absolute: 0.5 10*3/uL (ref 0.1–1.0)
Neutro Abs: 5.8 10*3/uL (ref 1.7–7.7)
Neutrophils Relative %: 84 % — ABNORMAL HIGH (ref 43–77)
PLATELETS: 138 10*3/uL — AB (ref 150–400)
RBC: 3.78 MIL/uL — AB (ref 3.87–5.11)
RDW: 18.1 % — ABNORMAL HIGH (ref 11.5–15.5)
WBC: 7 10*3/uL (ref 4.0–10.5)

## 2013-12-29 LAB — BASIC METABOLIC PANEL
Anion gap: 7 (ref 5–15)
BUN: 22 mg/dL (ref 6–23)
CO2: 27 mmol/L (ref 19–32)
Calcium: 10.3 mg/dL (ref 8.4–10.5)
Chloride: 103 mEq/L (ref 96–112)
Creatinine, Ser: 1.3 mg/dL — ABNORMAL HIGH (ref 0.50–1.10)
GFR calc Af Amer: 43 mL/min — ABNORMAL LOW (ref >90)
GFR calc non Af Amer: 37 mL/min — ABNORMAL LOW (ref >90)
GLUCOSE: 128 mg/dL — AB (ref 70–99)
POTASSIUM: 3.8 mmol/L (ref 3.5–5.1)
Sodium: 137 mmol/L (ref 135–145)

## 2013-12-29 LAB — MRSA PCR SCREENING: MRSA by PCR: NEGATIVE

## 2013-12-29 LAB — STREP PNEUMONIAE URINARY ANTIGEN: Strep Pneumo Urinary Antigen: NEGATIVE

## 2013-12-29 MED ORDER — SODIUM CHLORIDE 0.9 % IV SOLN
250.0000 mL | INTRAVENOUS | Status: DC | PRN
  Administered 2013-12-29: 250 mL via INTRAVENOUS

## 2013-12-29 MED ORDER — VANCOMYCIN HCL IN DEXTROSE 1-5 GM/200ML-% IV SOLN
1000.0000 mg | Freq: Once | INTRAVENOUS | Status: AC
  Administered 2013-12-29: 1000 mg via INTRAVENOUS
  Filled 2013-12-29: qty 200

## 2013-12-29 MED ORDER — CETYLPYRIDINIUM CHLORIDE 0.05 % MT LIQD
7.0000 mL | Freq: Two times a day (BID) | OROMUCOSAL | Status: DC
  Administered 2013-12-29 – 2014-01-06 (×15): 7 mL via OROMUCOSAL

## 2013-12-29 MED ORDER — GUAIFENESIN-DM 100-10 MG/5ML PO SYRP
5.0000 mL | ORAL_SOLUTION | ORAL | Status: DC | PRN
  Administered 2013-12-30 – 2014-01-01 (×7): 5 mL via ORAL
  Filled 2013-12-29 (×7): qty 5

## 2013-12-29 MED ORDER — VANCOMYCIN HCL IN DEXTROSE 1-5 GM/200ML-% IV SOLN
1000.0000 mg | INTRAVENOUS | Status: DC
  Administered 2013-12-30 – 2014-01-01 (×3): 1000 mg via INTRAVENOUS
  Filled 2013-12-29 (×8): qty 200

## 2013-12-29 MED ORDER — DILTIAZEM HCL 25 MG/5ML IV SOLN
20.0000 mg | Freq: Once | INTRAVENOUS | Status: AC
  Administered 2013-12-29: 20 mg via INTRAVENOUS

## 2013-12-29 MED ORDER — PANTOPRAZOLE SODIUM 40 MG PO TBEC
80.0000 mg | DELAYED_RELEASE_TABLET | Freq: Every day | ORAL | Status: DC
  Administered 2013-12-30 – 2014-01-06 (×8): 80 mg via ORAL
  Filled 2013-12-29 (×8): qty 2

## 2013-12-29 MED ORDER — ONDANSETRON HCL 4 MG/2ML IJ SOLN: 4.0000 mg | Freq: Four times a day (QID) | INTRAMUSCULAR | Status: DC | PRN

## 2013-12-29 MED ORDER — METOPROLOL TARTRATE 25 MG PO TABS
25.0000 mg | ORAL_TABLET | Freq: Two times a day (BID) | ORAL | Status: DC
  Administered 2013-12-29 – 2014-01-02 (×8): 25 mg via ORAL
  Filled 2013-12-29 (×8): qty 1

## 2013-12-29 MED ORDER — DEXTROSE 5 % IV SOLN
1.0000 g | Freq: Once | INTRAVENOUS | Status: AC
  Administered 2013-12-29: 1 g via INTRAVENOUS
  Filled 2013-12-29: qty 1

## 2013-12-29 MED ORDER — DILTIAZEM HCL 100 MG IV SOLR
5.0000 mg/h | INTRAVENOUS | Status: DC
  Administered 2013-12-29 – 2013-12-30 (×3): 15 mg/h via INTRAVENOUS
  Filled 2013-12-29: qty 100

## 2013-12-29 MED ORDER — PIPERACILLIN-TAZOBACTAM 3.375 G IVPB
INTRAVENOUS | Status: AC
  Filled 2013-12-29: qty 100

## 2013-12-29 MED ORDER — URSODIOL 300 MG PO CAPS
300.0000 mg | ORAL_CAPSULE | Freq: Three times a day (TID) | ORAL | Status: DC
  Administered 2013-12-29 – 2014-01-03 (×15): 300 mg via ORAL
  Filled 2013-12-29 (×26): qty 1

## 2013-12-29 MED ORDER — PANTOPRAZOLE SODIUM 40 MG PO TBEC: 40.0000 mg | DELAYED_RELEASE_TABLET | Freq: Every day | ORAL | Status: DC

## 2013-12-29 MED ORDER — ALBUTEROL SULFATE (2.5 MG/3ML) 0.083% IN NEBU
2.5000 mg | INHALATION_SOLUTION | Freq: Once | RESPIRATORY_TRACT | Status: AC
  Administered 2013-12-29: 2.5 mg via RESPIRATORY_TRACT
  Filled 2013-12-29: qty 3

## 2013-12-29 MED ORDER — PREDNISONE 10 MG PO TABS
10.0000 mg | ORAL_TABLET | Freq: Every day | ORAL | Status: DC
  Administered 2013-12-30 – 2014-01-06 (×8): 10 mg via ORAL
  Filled 2013-12-29 (×8): qty 1

## 2013-12-29 MED ORDER — PREDNISONE 50 MG PO TABS
60.0000 mg | ORAL_TABLET | Freq: Once | ORAL | Status: AC
  Administered 2013-12-29: 60 mg via ORAL
  Filled 2013-12-29 (×2): qty 1

## 2013-12-29 MED ORDER — ONDANSETRON HCL 4 MG PO TABS: 4.0000 mg | ORAL_TABLET | Freq: Four times a day (QID) | ORAL | Status: DC | PRN

## 2013-12-29 MED ORDER — SODIUM CHLORIDE 0.9 % IJ SOLN
3.0000 mL | Freq: Two times a day (BID) | INTRAMUSCULAR | Status: DC
  Administered 2013-12-29 – 2014-01-06 (×15): 3 mL via INTRAVENOUS

## 2013-12-29 MED ORDER — IPRATROPIUM-ALBUTEROL 0.5-2.5 (3) MG/3ML IN SOLN
3.0000 mL | Freq: Once | RESPIRATORY_TRACT | Status: AC
Start: 2013-12-29 — End: 2013-12-29
  Administered 2013-12-29: 3 mL via RESPIRATORY_TRACT
  Filled 2013-12-29: qty 3

## 2013-12-29 MED ORDER — LATANOPROST 0.005 % OP SOLN
1.0000 [drp] | Freq: Every day | OPHTHALMIC | Status: DC
  Administered 2013-12-29 – 2014-01-05 (×8): 1 [drp] via OPHTHALMIC
  Filled 2013-12-29: qty 2.5

## 2013-12-29 MED ORDER — LATANOPROST 0.005 % OP SOLN
OPHTHALMIC | Status: AC
  Filled 2013-12-29: qty 2.5

## 2013-12-29 MED ORDER — SPIRONOLACTONE 25 MG PO TABS
50.0000 mg | ORAL_TABLET | Freq: Every day | ORAL | Status: DC
  Administered 2013-12-30 – 2014-01-06 (×8): 50 mg via ORAL
  Filled 2013-12-29 (×2): qty 2
  Filled 2013-12-29: qty 1
  Filled 2013-12-29 (×4): qty 2
  Filled 2013-12-29: qty 1
  Filled 2013-12-29 (×2): qty 2

## 2013-12-29 MED ORDER — SODIUM CHLORIDE 0.9 % IJ SOLN: 3.0000 mL | INTRAMUSCULAR | Status: DC | PRN

## 2013-12-29 MED ORDER — DEXTROSE 5 % IV SOLN
5.0000 mg/h | Freq: Once | INTRAVENOUS | Status: AC
  Administered 2013-12-29: 5 mg/h via INTRAVENOUS
  Filled 2013-12-29: qty 100

## 2013-12-29 MED ORDER — PIPERACILLIN-TAZOBACTAM 3.375 G IVPB
3.3750 g | Freq: Three times a day (TID) | INTRAVENOUS | Status: DC
  Administered 2013-12-29 – 2014-01-02 (×11): 3.375 g via INTRAVENOUS
  Filled 2013-12-29 (×24): qty 50

## 2013-12-29 MED ORDER — AZATHIOPRINE 50 MG PO TABS
75.0000 mg | ORAL_TABLET | Freq: Every day | ORAL | Status: DC
  Administered 2013-12-30 – 2014-01-06 (×8): 75 mg via ORAL
  Filled 2013-12-29 (×10): qty 2

## 2013-12-29 NOTE — ED Notes (Signed)
MD at bedside. 

## 2013-12-29 NOTE — ED Notes (Signed)
Following bolus, pt remains in A-fib RVR rate 108-117.

## 2013-12-29 NOTE — ED Notes (Signed)
PT c/o nasal congestion/ sore throat and productive cough x2 weeks. PT reports thick white phlegm with cough. PT denies any SOB or CP.

## 2013-12-29 NOTE — ED Provider Notes (Signed)
CSN: [id number]     Arrival date & time 12/29/13  6734 History  This chart was scribed for Eileen Gaskins, MD by Ronney Lion, ED Scribe. This patient was seen in room APA03/APA03 and the patient's care was started at 9:53 AM.    Chief Complaint  Patient presents with  . Cough   Patient is a 80 y.o. female presenting with cough. The history is provided by the patient and a caregiver. No language interpreter was used.  Cough Duration:  3 weeks Timing:  Intermittent Progression:  Worsening Chronicity:  New Smoker: yes   Context: sick contacts   Ineffective treatments:  Cough suppressants Associated symptoms: no chest pain, no fever, no shortness of breath and no sore throat      HPI Comments: Eileen Santana is a 80 y.o. female who presents to the Emergency Department complaining of an intermittent, worsening cough that began 3 weeks ago. She complains of associated generalized weakness this morning when getting ready to go to church. She was recently seen for bronchitis, but her symptoms seem to have persisted. Patient has tried Mucinex, per daughter. Daughter also notes that others in the household have been sick recently. Patient denies use of home oxygen or inhalers. She denies hemoptysis, SOB, fever, chest pain, back pain, abdominal pain, and sore throat. Patient is an occasional smoker.     Past Medical History  Diagnosis Date  . History of GI bleed     Associated with NSAIDS  . Iron deficiency anemia     Transfusion dependent  . DJD (degenerative joint disease)   . Essential hypertension, benign   . History of colitis   . Ileitis   . Pancytopenia   . Autoimmune hepatitis     With leukocytoclastic vasculitis  . Heart block AV second degree March 2013  . Bronchitis   . Steroid-induced myopathy   . Renal calculus 08/12/2011  . Rheumatoid arthritis(714.0)   . Colon ulcer 04/2010    NSAID related  . Gastric ulcer 04/2010    NSAID related  . Cancer   . UGI bleed 09/20/2011     Focal area of gastritis oozing of blood  . Gastric AVM 09/20/2011  . Candida esophagitis 09/20/2011  . Paroxysmal atrial fibrillation   . Pericarditis     2D Echo EF 65%-70%  . Chronic kidney disease     Probably stage II to III with a recent acute exacerbation  . Cholestatic hepatitis   . UTI (lower urinary tract infection)     history   Past Surgical History  Procedure Laterality Date  . Colonoscopy  04/2010  . Upper gastrointestinal endoscopy  04/2010  . Esophagogastroduodenoscopy  09/20/2011    Status post APC.  Marland Kitchen Esophagogastroduodenoscopy  09/20/2011    Procedure: ESOPHAGOGASTRODUODENOSCOPY (EGD);  Surgeon: Malissa Hippo, MD;  Location: [facility];  Service: Endoscopy;  Laterality: N/A;  . Cyst removal hand      Elbow   Family History  Problem Relation Age of Onset  . Stroke Mother   . Hypertension Sister   . Hypertension Brother   . Heart disease Mother 76  . Heart disease Father 95  . COPD Brother   . Arthritis Brother   . Osteoporosis Sister    History  Substance Use Topics  . Smoking status: Current Some Day Smoker -- 0.25 packs/day    Types: Cigarettes  . Smokeless tobacco: Never Used  . Alcohol Use: No   OB History    No data  available     Review of Systems  Constitutional: Negative for fever.  HENT: Negative for sore throat.   Respiratory: Positive for cough. Negative for shortness of breath.   Cardiovascular: Negative for chest pain.  Gastrointestinal: Negative for abdominal pain.  Musculoskeletal: Negative for back pain.  Neurological: Positive for weakness.  All other systems reviewed and are negative.     Allergies  Iohexol; Ivp dye; Naprosyn; Red blood cells; Verapamil; Vicodin; and Sulfa antibiotics  Home Medications   Prior to Admission medications   Medication Sig Start Date End Date Taking? Authorizing Provider  acetaminophen (TYLENOL) 325 MG tablet Take 650 mg by mouth every 6 (six) hours as needed. For pain    Historical  Provider, MD  azaTHIOprine (IMURAN) 50 MG tablet TAKE 1 AND 1/2 TABLETS BY MOUTH ONCE DAILY 11/18/13   Malissa Hippo, MD  Cholecalciferol (VITAMIN D) 2000 UNITS tablet Take 2,000 Units by mouth daily.    Historical Provider, MD  Coenzyme Q10 200 MG capsule Take 200 mg by mouth daily.    Historical Provider, MD  Darbepoetin Alfa-Albumin (ARANESP IJ) Inject as directed. PRN    Historical Provider, MD  DEXILANT 60 MG capsule TAKE 1 CAPSULE BY MOUTH EVERY OTHER DAY 11/12/13   Malissa Hippo, MD  diclofenac sodium (VOLTAREN) 1 % GEL Apply 1 application topically daily as needed (for pain). Apply to back and legs    Historical Provider, MD  diltiazem (CARDIZEM CD) 120 MG 24 hr capsule Take 1 capsule (120 mg total) by mouth daily. 06/27/13   Jodelle Gross, NP  fish oil-omega-3 fatty acids 1000 MG capsule Take 1 g by mouth daily.    Historical Provider, MD  folic acid (FOLVITE) 800 MCG tablet Take 800 mcg by mouth daily.     Historical Provider, MD  furosemide (LASIX) 20 MG tablet Take 20 mg by mouth daily as needed. For fluid retention 04/26/11   Malissa Hippo, MD  magnesium oxide (MAG-OX) 400 MG tablet Take 400 mg by mouth 4 (four) times a week. Takes Monday Wednesday Friday and Saturday    Historical Provider, MD  metoprolol tartrate (LOPRESSOR) 25 MG tablet TAKE 1 TABLET BY MOUTH TWICE DAILY ** NEED APPOINTMENT FOR FURTHER REFILLS ** 12/13/13   Jodelle Gross, NP  Multiple Vitamins-Minerals (MULTIVITAMIN WITH MINERALS) tablet Take 1 tablet by mouth daily.      Historical Provider, MD  potassium chloride SA (K-DUR,KLOR-CON) 20 MEQ tablet TAKE 1 TABLET BY MOUTH ONCE DAILY 11/18/13   Malissa Hippo, MD  predniSONE (DELTASONE) 10 MG tablet Take 1 tablet (10 mg total) by mouth daily with breakfast. 12/13/13   Malissa Hippo, MD  sodium chloride 0.9 % SOLN 100 mL with ferumoxytol 510 MG/17ML SOLN 1,020 mg Inject 1,020 mg into the vein as needed.     Historical Provider, MD  spironolactone  (ALDACTONE) 50 MG tablet TAKE 1 TABLET BY MOUTH DAILY 12/11/13   Malissa Hippo, MD  Travoprost, BAK Free, (TRAVATAN) 0.004 % SOLN ophthalmic solution Place 1 drop into both eyes at bedtime. 09/20/11   Elliot Cousin, MD  ursodiol (ACTIGALL) 250 MG tablet TAKE 2 TABLETS BY MOUTH TWICE DAILY    Len Blalock, NP  vitamin B-12 (CYANOCOBALAMIN) 1000 MCG tablet Take 1,000 mcg by mouth daily.    Historical Provider, MD   BP 112/66 mmHg  Pulse 100  Temp(Src) 98.9 F (37.2 C) (Oral)  Resp 20  Ht 5\' 6"  (1.676 m)  Wt 154  lb 5 oz (69.996 kg)  BMI 24.92 kg/m2  SpO2 98% Physical Exam  Nursing note and vitals reviewed.    CONSTITUTIONAL: Well developed/well nourished HEAD: Normocephalic/atraumatic ENMT: Mucous membranes moist; nasal congestion noted; uvula midline NECK: supple no meningeal signs SPINE/BACK:entire spine nontender CV: tachycardic, no murmurs/rubs/gallops noted LUNGS: Coarse wheezing bilaterally ABDOMEN: soft, nontender, no rebound or guarding, bowel sounds noted throughout abdomen GU:no cva tenderness NEURO: Pt is awake/alert/appropriate, moves all extremitiesx4.  No facial droop.   EXTREMITIES: pulses normal/equal, full ROM SKIN: warm, color normal PSYCH: no abnormalities of mood noted, alert and oriented to situation   ED Course  Procedures   CRITICAL CARE Performed by: Eileen Santana Total critical care time: 31 Critical care time was exclusive of separately billable procedures and treating other patients. Critical care was necessary to treat or prevent imminent or life-threatening deterioration. Critical care was time spent personally by me on the following activities: development of treatment plan with patient and/or surrogate as well as nursing, discussions with consultants, evaluation of patient's response to treatment, examination of patient, obtaining history from patient or surrogate, ordering and performing treatments and interventions, ordering and review of  laboratory studies, ordering and review of radiographic studies, pulse oximetry and re-evaluation of patient's condition. Patient atrial fibrillation requiring cardizem drip She also has COPD exacerbation  DIAGNOSTIC STUDIES: Oxygen Saturation is 98% on room air, normal by my interpretation.    COORDINATION OF CARE: 9:56 AM - Discussed treatment plan with pt at bedside which includes breathing treatments, CXR, and blood tests, and pt agreed to plan.  11:32 AM Pt now in atrial fibrillation with RVR.  She has h/o paroxysmal Afib Will treat afib She also pneumonia by CXR She receives frequent infusions due to chronic anemia, likely healthcare associated pneumonia 12:59 PM Pt with uncontrolled afib despite cardizem infusion Will need admission Discussed with dr Irene Limbo will admit for HCAP, atrial fibrillation and COPD BP 135/78 mmHg  Pulse 139  Temp(Src) 98.9 F (37.2 C) (Oral)  Resp 19  Ht 5\' 6"  (1.676 m)  Wt 154 lb 5 oz (69.996 kg)  BMI 24.92 kg/m2  SpO2 94%   Labs Review Labs Reviewed  CBC WITH DIFFERENTIAL - Abnormal; Notable for the following:    RBC 3.78 (*)    Hemoglobin 10.3 (*)    HCT 31.1 (*)    RDW 18.1 (*)    Platelets 138 (*)    Neutrophils Relative % 84 (*)    Lymphocytes Relative 8 (*)    Lymphs Abs 0.6 (*)    All other components within normal limits  BASIC METABOLIC PANEL    Imaging Review Dg Chest 2 View  12/29/2013   CLINICAL DATA:  Cough, history hypertension  EXAM: CHEST  2 VIEW  COMPARISON:  10/20/2013  FINDINGS: Enlargement of cardiac silhouette.  Mediastinal contours and pulmonary vascularity normal.  Atherosclerotic calcification aorta.  Emphysematous and minimal bronchitic changes with RIGHT lower lobe consolidation consistent with pneumonia.  No pleural effusion or pneumothorax.  Bones demineralized.  IMPRESSION: COPD changes with RIGHT lower lobe pneumonia.  Chronic enlargement of cardiac silhouette.   Electronically Signed   By: 10/22/2013  M.D.   On: 12/29/2013 11:22       Date: 12/29/2013 10:04am  Rate: 137  Rhythm: atrial flutter  QRS Axis: left  Intervals: prolonged qt  ST/T Wave abnormalities: nonspecific ST changes  Conduction Disutrbances:none  Medications  vancomycin (VANCOCIN) IVPB 1000 mg/200 mL premix (1,000 mg Intravenous New Bag/Given 12/29/13 1245)  ipratropium-albuterol (DUONEB) 0.5-2.5 (3) MG/3ML nebulizer solution 3 mL (3 mLs Nebulization Given 12/29/13 1015)  albuterol (PROVENTIL) (2.5 MG/3ML) 0.083% nebulizer solution 2.5 mg (2.5 mg Nebulization Given 12/29/13 1015)  predniSONE (DELTASONE) tablet 60 mg (60 mg Oral Given 12/29/13 1012)  diltiazem (CARDIZEM) 100 mg in dextrose 5 % 100 mL (1 mg/mL) infusion (5 mg/hr Intravenous New Bag/Given 12/29/13 1213)  ceFEPIme (MAXIPIME) 1 g in dextrose 5 % 50 mL IVPB (0 g Intravenous Stopped 12/29/13 1245)  diltiazem (CARDIZEM) injection 20 mg (0 mg Intravenous Stopped 12/29/13 1245)      MDM   Final diagnoses:  Atrial fibrillation with rapid ventricular response  HCAP (healthcare-associated pneumonia)  Chronic obstructive pulmonary disease with acute lower respiratory infection    Nursing notes including past medical history and social history reviewed and considered in documentation xrays/imaging reviewed by myself and considered during evaluation Labs/vital reviewed myself and considered during evaluation Previous records reviewed and considered   I personally performed the services described in this documentation, which was scribed in my presence. The recorded information has been reviewed and is accurate.         Eileen Gaskins, MD 12/29/13 [phone]

## 2013-12-29 NOTE — Progress Notes (Signed)
ANTIBIOTIC CONSULT NOTE - INITIAL  Pharmacy Consult for Vancomycin & Zosyn Indication: pneumonia  Allergies  Allergen Reactions  . Iohexol Swelling    IV Dye   . Ivp Dye [Iodinated Diagnostic Agents] Swelling    Hives  . Naprosyn [Naproxen] Hives and Itching  . Red Blood Cells Swelling and Dermatitis    With blood transfusion 2012  . Verapamil     Heart block (2nd degree) Takes Cardizem   . Vicodin [Hydrocodone-Acetaminophen] Hives and Itching  . Sulfa Antibiotics Itching and Rash    Patient Measurements: Height: 5\' 6"  (167.6 cm) Weight: 154 lb 5 oz (69.996 kg) IBW/kg (Calculated) : 59.3  Vital Signs: Temp: 98.9 F (37.2 C) (12/27 0942) Temp Source: Oral (12/27 0942) BP: 119/80 mmHg (12/27 1530) Pulse Rate: 110 (12/27 1530) Intake/Output from previous day:   Intake/Output from this shift:    Labs:  Recent Labs  12/29/13 1058  WBC 7.0  HGB 10.3*  PLT 138*  CREATININE 1.30*   Estimated Creatinine Clearance: 31.8 mL/min (by C-G formula based on Cr of 1.3). No results for input(s): VANCOTROUGH, VANCOPEAK, VANCORANDOM, GENTTROUGH, GENTPEAK, GENTRANDOM, TOBRATROUGH, TOBRAPEAK, TOBRARND, AMIKACINPEAK, AMIKACINTROU, AMIKACIN in the last 72 hours.   Microbiology: No results found for this or any previous visit (from the past 720 hour(s)).  Medical History: Past Medical History  Diagnosis Date  . History of GI bleed     Associated with NSAIDS  . Iron deficiency anemia     Transfusion dependent  . DJD (degenerative joint disease)   . Essential hypertension, benign   . History of colitis   . Ileitis   . Pancytopenia   . Autoimmune hepatitis     With leukocytoclastic vasculitis  . Heart block AV second degree March 2013  . Bronchitis   . Steroid-induced myopathy   . Renal calculus 08/12/2011  . Rheumatoid arthritis(714.0)   . Colon ulcer 04/2010    NSAID related  . Gastric ulcer 04/2010    NSAID related  . Cancer   . UGI bleed 09/20/2011    Focal area of  gastritis oozing of blood  . Gastric AVM 09/20/2011  . Candida esophagitis 09/20/2011  . Paroxysmal atrial fibrillation   . Pericarditis     2D Echo EF 65%-70%  . Chronic kidney disease     Probably stage II to III with a recent acute exacerbation  . Cholestatic hepatitis   . UTI (lower urinary tract infection)     history    Medications:  Scheduled:  . antiseptic oral rinse  7 mL Mouth Rinse BID  . [START ON 12/30/2013] azaTHIOprine  75 mg Oral Daily  . latanoprost  1 drop Both Eyes QHS  . metoprolol tartrate  25 mg Oral BID  . [START ON 12/30/2013] pantoprazole  80 mg Oral Daily  . [START ON 12/30/2013] predniSONE  10 mg Oral Q breakfast  . sodium chloride  3 mL Intravenous Q12H  . [START ON 12/30/2013] spironolactone  50 mg Oral Daily  . ursodiol  300 mg Oral TID   Assessment: 80 yo immunocompromised F who presented with congestion and sore throat.   She is afebrile with normal WBC.  CXR+ RLL PNA.   Scr is slightly elevated above patient's baseline.  Asked to initiate empiric antibiotics for HCAP.  Initial antibiotic doses given in ED.  Vancomycin 12/27>> Zosyn 12/27>>  Goal of Therapy:  Vancomycin trough level 15-20 mcg/ml  Plan:  Zosyn 3.375gm IV Q8h to be infused over 4hrs Vancomycin 1gm  IV q24h Check Vancomycin trough at steady state Monitor renal function and cx data   Elson Clan 12/29/2013,4:49 PM

## 2013-12-29 NOTE — H&P (Signed)
History and Physical  Eileen Santana QHU:[id number] DOB: 02/26/1930 DOA: 12/29/2013  Referring physician: Dr. Bebe Shaggy in ED PCP: [name], MD   Chief Complaint: Cough  HPI:  80 year old woman with complicated past medical history as below presented with several day to one week history of cough, fatigue, generalized weakness. In the emergency department found to have atrial fibrillation with rapid ventricular response as well as right lower lobe pneumonia.  History obtained from patient and daughter (who is a retired Copy) at bedside. Patient with cough and nonspecific symptoms and fatigue up to 1 week. She has had no fever or systemic symptoms but is immunosuppressed. This morning she was noted to be even weaker and her daughter noted that her heart rate was rapid and on auscultation noted rales. No sick contacts.  Patient has a history of paroxysmal atrial fibrillation but this has been very well controlled on oral rate control agents. She is not a candidate for anticoagulation secondary to transfusion dependent anemia and history of GI bleed. Back in 2012 she developed autoimmune phenomena resulting in hepatitis as well as anemia and thrombocytopenia.  In the emergency department afebrile, hemodynamic stable, no hypoxia; found to have atrial fibrillation with rapid ventricular response, treated with diltiazem bolus followed by infusion. Cefepime and vancomycin for pneumonia. Basic metabolic panel unremarkable. Thrombocytopenia and anemia stable.chest x-ray revealed right lower lobe pneumonia.  Review of Systems:  Negative for fever, visual changes, sore throat, rash, new muscle aches, chest pain, SOB, dysuria, bleeding, n/v/abdominal pain.  Past Medical History  Diagnosis Date  . History of GI bleed     Associated with NSAIDS  . Iron deficiency anemia     Transfusion dependent  . DJD (degenerative joint disease)   . Essential hypertension, benign   . History of  colitis   . Ileitis   . Pancytopenia   . Autoimmune hepatitis     With leukocytoclastic vasculitis  . Heart block AV second degree March 2013  . Bronchitis   . Steroid-induced myopathy   . Renal calculus 08/12/2011  . Rheumatoid arthritis(714.0)   . Colon ulcer 04/2010    NSAID related  . Gastric ulcer 04/2010    NSAID related  . Cancer   . UGI bleed 09/20/2011    Focal area of gastritis oozing of blood  . Gastric AVM 09/20/2011  . Candida esophagitis 09/20/2011  . Paroxysmal atrial fibrillation   . Pericarditis     2D Echo EF 65%-70%  . Chronic kidney disease     Probably stage II to III with a recent acute exacerbation  . Cholestatic hepatitis   . UTI (lower urinary tract infection)     history    Past Surgical History  Procedure Laterality Date  . Colonoscopy  04/2010  . Upper gastrointestinal endoscopy  04/2010  . Esophagogastroduodenoscopy  09/20/2011    Status post APC.  Marland Kitchen Esophagogastroduodenoscopy  09/20/2011    Procedure: ESOPHAGOGASTRODUODENOSCOPY (EGD);  Surgeon: Malissa Hippo, MD;  Location: [facility];  Service: Endoscopy;  Laterality: N/A;  . Cyst removal hand      Elbow    Social History:  reports that she has been smoking Cigarettes.  She has been smoking about 0.25 packs per day. She has never used smokeless tobacco. She reports that she does not drink alcohol or use illicit drugs.  Allergies  Allergen Reactions  . Iohexol Swelling    IV Dye   . Ivp Dye [Iodinated Diagnostic Agents] Swelling    Hives  .  Naprosyn [Naproxen] Hives and Itching  . Red Blood Cells Swelling and Dermatitis    With blood transfusion 2012  . Verapamil     Heart block (2nd degree) Takes Cardizem   . Vicodin [Hydrocodone-Acetaminophen] Hives and Itching  . Sulfa Antibiotics Itching and Rash    Family History  Problem Relation Age of Onset  . Stroke Mother   . Hypertension Sister   . Hypertension Brother   . Heart disease Mother 58  . Heart disease Father 95  . COPD  Brother   . Arthritis Brother   . Osteoporosis Sister      Prior to Admission medications   Medication Sig Start Date End Date Taking? Authorizing Provider  acetaminophen (TYLENOL) 325 MG tablet Take 650 mg by mouth every 6 (six) hours as needed. For pain   Yes Historical Provider, MD  azaTHIOprine (IMURAN) 50 MG tablet TAKE 1 AND 1/2 TABLETS BY MOUTH ONCE DAILY 11/18/13  Yes Malissa Hippo, MD  Cholecalciferol (VITAMIN D) 2000 UNITS tablet Take 2,000 Units by mouth daily.   Yes Historical Provider, MD  Coenzyme Q10 200 MG capsule Take 200 mg by mouth daily.   Yes Historical Provider, MD  Darbepoetin Alfa (ARANESP) 500 MCG/ML SOSY injection Inject 500 mcg into the skin every 21 ( twenty-one) days.   Yes Historical Provider, MD  DEXILANT 60 MG capsule TAKE 1 CAPSULE BY MOUTH EVERY OTHER DAY 11/12/13  Yes Malissa Hippo, MD  diclofenac sodium (VOLTAREN) 1 % GEL Apply 1 application topically daily as needed (for pain). Apply to back and legs   Yes Historical Provider, MD  diltiazem (CARDIZEM CD) 120 MG 24 hr capsule Take 1 capsule (120 mg total) by mouth daily. 06/27/13  Yes Jodelle Gross, NP  fish oil-omega-3 fatty acids 1000 MG capsule Take 1 g by mouth daily.   Yes Historical Provider, MD  folic acid (FOLVITE) 800 MCG tablet Take 800 mcg by mouth daily.    Yes Historical Provider, MD  furosemide (LASIX) 20 MG tablet Take 20 mg by mouth daily as needed. For fluid retention 04/26/11  Yes Malissa Hippo, MD  Magnesium 250 MG TABS Take 1 tablet by mouth 2 (two) times daily.   Yes Historical Provider, MD  metoprolol tartrate (LOPRESSOR) 25 MG tablet TAKE 1 TABLET BY MOUTH TWICE DAILY ** NEED APPOINTMENT FOR FURTHER REFILLS ** 12/13/13  Yes Jodelle Gross, NP  Multiple Vitamins-Minerals (MULTIVITAMIN WITH MINERALS) tablet Take 1 tablet by mouth daily.     Yes Historical Provider, MD  potassium chloride SA (K-DUR,KLOR-CON) 20 MEQ tablet TAKE 1 TABLET BY MOUTH ONCE DAILY 11/18/13  Yes Malissa Hippo, MD  predniSONE (DELTASONE) 10 MG tablet Take 1 tablet (10 mg total) by mouth daily with breakfast. 12/13/13  Yes Malissa Hippo, MD  sodium chloride 0.9 % SOLN 100 mL with ferumoxytol 510 MG/17ML SOLN 1,020 mg Inject 510 mg into the vein as needed (IF HEMOGLOBIN IS LESS THAN 10.).    Yes Historical Provider, MD  spironolactone (ALDACTONE) 50 MG tablet TAKE 1 TABLET BY MOUTH DAILY 12/11/13  Yes Malissa Hippo, MD  Travoprost, BAK Free, (TRAVATAN) 0.004 % SOLN ophthalmic solution Place 1 drop into both eyes at bedtime. 09/20/11  Yes Elliot Cousin, MD  ursodiol (ACTIGALL) 300 MG capsule Take 1 capsule by mouth 3 (three) times daily. 12/03/13  Yes Historical Provider, MD  vitamin B-12 (CYANOCOBALAMIN) 1000 MCG tablet Take 1,000 mcg by mouth daily.   Yes Historical Provider,  MD  ursodiol (ACTIGALL) 250 MG tablet TAKE 2 TABLETS BY MOUTH TWICE DAILY Patient not taking: Reported on 12/29/2013    Len Blalock, NP   Physical Exam: Filed Vitals:   12/29/13 1030 12/29/13 1100 12/29/13 1130 12/29/13 1200  BP: 118/65 116/80 102/65 121/81  Pulse: 133 63 53 110  Temp:      TempSrc:      Resp: 16 22 22 31   Height:      Weight:      SpO2: 94% 95% 95% 93%    General: Examined in the emergency department. Appears calm and comfortable Eyes: PERRL, normal lids, irises ENT: grossly normal hearing, lips & tongue Neck: no LAD, masses or thyromegaly Cardiovascular: Irregular tachycardic, 100s on telemetry,, no m/r/g. No significant LE edema. Respiratory: CTA bilaterally anteriorly. Posterior bibasilar rales with some rhonchi on the right. No wheezes. Fair air movement. Normal respiratory effort. Abdomen: soft, ntnd Skin: no rash or induration seen  Musculoskeletal: grossly normal tone BUE/BLE Psychiatric: grossly normal mood and affect, speech fluent and appropriate Neurologic: grossly non-focal.  Wt Readings from Last 3 Encounters:  12/29/13 69.996 kg (154 lb 5 oz)  12/02/13 70.761 kg (156 lb)   11/07/13 67.767 kg (149 lb 6.4 oz)    Labs on Admission:  Basic Metabolic Panel:  Recent Labs Lab 12/23/13 1311 12/29/13 1058  NA 140 137  K 4.3 3.8  CL 100 103  CO2 26 27  GLUCOSE 130* 128*  BUN 18 22  CREATININE 1.43* 1.30*  CALCIUM 10.1 10.3    Liver Function Tests:  Recent Labs Lab 12/23/13 1311  AST 61*  ALT 12  ALKPHOS 61  BILITOT 1.0  PROT 6.8  ALBUMIN 2.5*    CBC:  Recent Labs Lab 12/23/13 1305 12/29/13 1058  WBC 5.0 7.0  NEUTROABS 4.0 5.8  HGB 10.0* 10.3*  HCT 30.4* 31.1*  MCV 84.2 82.3  PLT 118* 138*     Radiological Exams on Admission: Dg Chest 2 View  12/29/2013   CLINICAL DATA:  Cough, history hypertension  EXAM: CHEST  2 VIEW  COMPARISON:  10/20/2013  FINDINGS: Enlargement of cardiac silhouette.  Mediastinal contours and pulmonary vascularity normal.  Atherosclerotic calcification aorta.  Emphysematous and minimal bronchitic changes with RIGHT lower lobe consolidation consistent with pneumonia.  No pleural effusion or pneumothorax.  Bones demineralized.  IMPRESSION: COPD changes with RIGHT lower lobe pneumonia.  Chronic enlargement of cardiac silhouette.   Electronically Signed   By: 10/22/2013 M.D.   On: 12/29/2013 11:22    EKG: Independently reviewed. Atrial fibrillation with rapid ventricular response.   Principal Problem:   Healthcare-associated pneumonia Active Problems:   Other pancytopenia   Autoimmune hepatitis   Thrombocytopenia   Anemia   Atrial fibrillation with RVR   COPD (chronic obstructive pulmonary disease)   Assessment/Plan 1. Atrial fibrillation with RVR. History of paroxysmal atrial fib/flutter, poor candidate for anticoagulation secondary to history of transfusion dependent anemia and recurrent GI bleed. 2. Right lower lobe pneumonia, immunocompromised, receives intermittent transfusions, treat as healthcare associated. Given location cannot exclude aspiration. Zosyn and vancomycin. 3. COPD. appears clinically  stable at this point. 4. Transfusion-dependent anemia, stable, secondary to autoimmune disease. 5. Thrombocytopenia, stable, secondary to autoimmune disease. 6. Chronic kidney disease stage III, appears stable. 7. Tobacco abuse 8. History of GI bleed associated with NSAIDs gastric and colonic ulcers, gastric AVM 9. H/o autoimmune hepatitis with leukocytoclastic vasculitis, pancytopenia, rheumatoid arthritis   Appears clinically stable. Plan admission to stepdown unit for  diltiazem infusion to control heart rate. Hopefully can transition oral medications soon. Heart rate being driven by underlying infection.  Empiric antibiotics for HCAP, cannot exclude aspiration although no history to suggest this. Plan speech therapy evaluation  Discussed in detail, reviewed workup with the patient's daughter at bedside who is a retired Copy.  Code Status: full code DVT prophylaxis: SCDs (see above) Family Communication:  Disposition Plan/Anticipated LOS: admit 2-3 days  Time spent: 50 minutes  Brendia Sacks, MD  [facility] Pager [phone] 12/29/2013, 12:47 PM

## 2013-12-30 DIAGNOSIS — D61818 Other pancytopenia: Secondary | ICD-10-CM

## 2013-12-30 LAB — BASIC METABOLIC PANEL
Anion gap: 5 (ref 5–15)
BUN: 22 mg/dL (ref 6–23)
CALCIUM: 9.9 mg/dL (ref 8.4–10.5)
CO2: 26 mmol/L (ref 19–32)
Chloride: 103 mEq/L (ref 96–112)
Creatinine, Ser: 1.17 mg/dL — ABNORMAL HIGH (ref 0.50–1.10)
GFR, EST AFRICAN AMERICAN: 49 mL/min — AB (ref >90)
GFR, EST NON AFRICAN AMERICAN: 43 mL/min — AB (ref >90)
Glucose, Bld: 127 mg/dL — ABNORMAL HIGH (ref 70–99)
Potassium: 4.2 mmol/L (ref 3.5–5.1)
Sodium: 134 mmol/L — ABNORMAL LOW (ref 135–145)

## 2013-12-30 LAB — CBC
HCT: 28.2 % — ABNORMAL LOW (ref 36.0–46.0)
Hemoglobin: 9.4 g/dL — ABNORMAL LOW (ref 12.0–15.0)
MCH: 27.2 pg (ref 26.0–34.0)
MCHC: 33.3 g/dL (ref 30.0–36.0)
MCV: 81.5 fL (ref 78.0–100.0)
PLATELETS: 149 10*3/uL — AB (ref 150–400)
RBC: 3.46 MIL/uL — ABNORMAL LOW (ref 3.87–5.11)
RDW: 18.2 % — ABNORMAL HIGH (ref 11.5–15.5)
WBC: 8 10*3/uL (ref 4.0–10.5)

## 2013-12-30 LAB — URINALYSIS, ROUTINE W REFLEX MICROSCOPIC
Bilirubin Urine: NEGATIVE
Glucose, UA: NEGATIVE mg/dL
Hgb urine dipstick: NEGATIVE
LEUKOCYTES UA: NEGATIVE
Nitrite: NEGATIVE
PROTEIN: NEGATIVE mg/dL
Specific Gravity, Urine: 1.02 (ref 1.005–1.030)
Urobilinogen, UA: 0.2 mg/dL (ref 0.0–1.0)
pH: 6.5 (ref 5.0–8.0)

## 2013-12-30 MED ORDER — DILTIAZEM HCL ER COATED BEADS 120 MG PO CP24
120.0000 mg | ORAL_CAPSULE | Freq: Every day | ORAL | Status: DC
  Administered 2013-12-30: 120 mg via ORAL
  Filled 2013-12-30: qty 1

## 2013-12-30 MED ORDER — MAGNESIUM OXIDE 400 (241.3 MG) MG PO TABS
400.0000 mg | ORAL_TABLET | Freq: Two times a day (BID) | ORAL | Status: DC
  Administered 2013-12-30 – 2014-01-06 (×15): 400 mg via ORAL
  Filled 2013-12-30 (×15): qty 1

## 2013-12-30 MED ORDER — GUAIFENESIN ER 600 MG PO TB12
600.0000 mg | ORAL_TABLET | Freq: Two times a day (BID) | ORAL | Status: DC
Start: 2013-12-30 — End: 2014-01-06
  Administered 2013-12-30 – 2014-01-06 (×15): 600 mg via ORAL
  Filled 2013-12-30 (×15): qty 1

## 2013-12-30 MED ORDER — DOCUSATE SODIUM 100 MG PO CAPS
100.0000 mg | ORAL_CAPSULE | Freq: Two times a day (BID) | ORAL | Status: DC
  Administered 2013-12-30 – 2014-01-06 (×15): 100 mg via ORAL
  Filled 2013-12-30 (×15): qty 1

## 2013-12-30 MED ORDER — POTASSIUM CHLORIDE CRYS ER 20 MEQ PO TBCR
20.0000 meq | EXTENDED_RELEASE_TABLET | ORAL | Status: DC
  Administered 2013-12-30 – 2014-01-06 (×4): 20 meq via ORAL
  Filled 2013-12-30 (×4): qty 1

## 2013-12-30 MED ORDER — BENZONATATE 100 MG PO CAPS
100.0000 mg | ORAL_CAPSULE | Freq: Three times a day (TID) | ORAL | Status: DC | PRN
  Administered 2013-12-30 – 2014-01-05 (×6): 100 mg via ORAL
  Filled 2013-12-30 (×6): qty 1

## 2013-12-30 NOTE — Care Management Utilization Note (Signed)
UR complete 

## 2013-12-30 NOTE — Plan of Care (Signed)
Problem: Phase I Progression Outcomes Goal: Ventricular heart rate < 120/min Outcome: Completed/Met Date Met:  12/30/13 HR in 70's to 80's    Goal: Heart rate or rhythm control medication Outcome: Completed/Met Date Met:  12/30/13 Off cardizem gtt; on oral cardizem   Problem: Phase II Progression Outcomes Goal: Progress activity as tolerated unless otherwise ordered Outcome: Progressing Pt ambulating in room; tolerated well Goal: Tolerating diet Outcome: Completed/Met Date Met:  12/30/13 On heart healthy diet tolerating well

## 2013-12-30 NOTE — Progress Notes (Signed)
Pt appears to be resting comfortably, vss, no c/o pain, low bed, HOB self regulated, call bell at pt's side, will cont to monitor 

## 2013-12-30 NOTE — Care Management Note (Addendum)
    Page 1 of 1   01/06/2014     11:39:49 AM CARE MANAGEMENT NOTE 01/06/2014  Patient:  MURANDA, COYE   Account Number:  [id number]  Date Initiated:  12/30/2013  Documentation initiated by:  Kathyrn Sheriff  Subjective/Objective Assessment:   Pt admitted with HCAP. Pt is from home, lives with daughters. Pt has cane, walker and wheelchair at home. Pt's daughter is a Engineer, civil (consulting). Pt has no HH serivces, or med needs prior to admission. Pt plans to discharge home with self care.     Action/Plan:   No CM needs identified at this time.   Anticipated DC Date:  01/01/2014   Anticipated DC Plan:  HOME/SELF CARE      DC Planning Services  CM consult      Choice offered to / List presented to:             Status of service:  Completed, signed off Medicare Important Message given?  YES (If response is "NO", the following Medicare IM given date fields will be blank) Date Medicare IM given:  01/06/2014 Medicare IM given by:  Kathyrn Sheriff Date Additional Medicare IM given:   Additional Medicare IM given by:    Discharge Disposition:  HOME/SELF CARE  Per UR Regulation:  Reviewed for med. necessity/level of care/duration of stay  If discussed at Long Length of Stay Meetings, dates discussed:    Comments:  01/06/2013 1130 Kathyrn Sheriff, RN, MSN, PCCN Pt plans to discharge home with self care today. Pt has strong family support and DME's from previous illness. No CM needs.  12/30/2013 1450 Kathyrn Sheriff, RN, MSN, [facility]

## 2013-12-30 NOTE — Plan of Care (Signed)
Problem: Phase I Progression Outcomes Goal: Anticoagulation Therapy per MD order Outcome: Not Progressing No medicinal therapy, scds ordered, pt refuses, re educated

## 2013-12-30 NOTE — Progress Notes (Signed)
PROGRESS NOTE  Eileen Santana VZD:[id number] DOB: 05/10/1930 DOA: 12/29/2013 PCP: [name], MD  Summary: 80 year old woman with complicated past medical history as below presented with several day to one week history of cough, fatigue, generalized weakness. In the emergency department found to have atrial fibrillation with rapid ventricular response as well as right lower lobe pneumonia.  Assessment/Plan: 1. Atrial fibrillation with rapid ventricular response, history of paroxysmal atrial fib/flutter. Rate controlled on IV diltiazem and 15. Poor candidate for anticoagulation secondary to history of transfusion dependent anemia and recurrent GI bleed. 2. Right lower lobe pneumonia, immunocompromised, treated as healthcare associated. Given location plan speech therapy evaluation. 3. COPD. Appears quiescent. 4. 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 Overall stable. 5. Thrombocytopenia secondary to autoimmune disease. Stable. 6. Chronic kidney disease stage III. 7. Tobacco abuse/dependence. 8. History of GI bleed associated with NSAIDs, gastric and colonic ulcers, gastric AVM several years ago 9. History of autoimmune hepatitis with leukocytoclastic vasculitis, pancytopenia and rheumatoid arthritis   Overall much improved. Respiratory status is stabilized. Heart rate control has been achieved.  Plan resume oral diltiazem, stop diltiazem infusion and monitor heart rate. If remains stable, plan transfer to floor later today. Likely home in the next 48 hours.  Continue empiric antibiotics, follow-up speech therapy recommendations.  Code Status: full code DVT prophylaxis: SCDs Family Communication:  Disposition Plan: home  Brendia Sacks, MD  [facility]  Pager [phone] If 7PM-7AM, please contact night-coverage at [email or web address], password [facility] 12/30/2013, 8:17 AM  LOS: 1 day   Consultants:    Procedures:    Antibiotics:  Zosyn 12/27  >>  Vancomycin 12/27  HPI/Subjective: No issue started overnight.  Feeling much better. Coughing quite a bit. Breathing better. Eating well. No nausea or vomiting.  Objective: Filed Vitals:   12/30/13 0530 12/30/13 0545 12/30/13 0600 12/30/13 0615  BP: 106/58 92/56 106/94 113/61  Pulse: 79 84 105 84  Temp:      TempSrc:      Resp: 15 19 30 21   Height:      Weight:      SpO2: 94% 97% 100% 96%    Intake/Output Summary (Last 24 hours) at 12/30/13 0817 Last data filed at 12/30/13 0600  Gross per 24 hour  Intake 1657.92 ml  Output    700 ml  Net 957.92 ml     Filed Weights   12/29/13 0942 12/30/13 0500  Weight: 69.996 kg (154 lb 5 oz) 70.1 kg (154 lb 8.7 oz)    Exam:     Afebrile. Heart rate controlled. Vital signs stable.   General: Appears calm, comfortable.  Psych: Alert. Speech fluent and clear.  CV: Irregular, normal rate. No murmur, rub or gallop.  Respiratory: Improved air movement. No frank wheezes, rales or rhonchi. Normal respiratory effort.  Data Reviewed:  Urine output 700+ basic metabolic panel unremarkable,   Creatinine has improved 1.30 >> 1.17  Hemoglobin slightly lower 10.3  >> 9.4    platelet count stable 149  Urinalysis negative  Pertinent data:  12/27 COPD changes with a right lower lobe pneumonia  Pending data:  Urine culture  Blood cultures  Sputum culture   Scheduled Meds: . antiseptic oral rinse  7 mL Mouth Rinse BID  . azaTHIOprine  75 mg Oral Daily  . latanoprost  1 drop Both Eyes QHS  . metoprolol tartrate  25 mg Oral BID  . pantoprazole  80 mg Oral Daily  . piperacillin-tazobactam (ZOSYN)  IV  3.375 g Intravenous Q8H  .  predniSONE  10 mg Oral Q breakfast  . sodium chloride  3 mL Intravenous Q12H  . spironolactone  50 mg Oral Daily  . ursodiol  300 mg Oral TID  . vancomycin  1,000 mg Intravenous Q24H   Continuous Infusions: . diltiazem (CARDIZEM) infusion 10 mg/hr (12/30/13 0553)    Principal Problem:    Healthcare-associated pneumonia Active Problems:   Other pancytopenia   Autoimmune hepatitis   Thrombocytopenia   Anemia   Atrial fibrillation with RVR   COPD (chronic obstructive pulmonary disease)   HCAP (healthcare-associated pneumonia)   Time spent 20 minutes

## 2013-12-31 ENCOUNTER — Encounter (INDEPENDENT_AMBULATORY_CARE_PROVIDER_SITE_OTHER): Payer: Self-pay | Admitting: *Deleted

## 2013-12-31 ENCOUNTER — Other Ambulatory Visit (INDEPENDENT_AMBULATORY_CARE_PROVIDER_SITE_OTHER): Payer: Self-pay | Admitting: *Deleted

## 2013-12-31 DIAGNOSIS — D509 Iron deficiency anemia, unspecified: Secondary | ICD-10-CM

## 2013-12-31 DIAGNOSIS — D696 Thrombocytopenia, unspecified: Secondary | ICD-10-CM

## 2013-12-31 DIAGNOSIS — K754 Autoimmune hepatitis: Secondary | ICD-10-CM

## 2013-12-31 DIAGNOSIS — D500 Iron deficiency anemia secondary to blood loss (chronic): Secondary | ICD-10-CM

## 2013-12-31 LAB — URINE CULTURE
Colony Count: NO GROWTH
Culture: NO GROWTH

## 2013-12-31 LAB — LEGIONELLA ANTIGEN, URINE

## 2013-12-31 MED ORDER — METOPROLOL TARTRATE 1 MG/ML IV SOLN
5.0000 mg | Freq: Once | INTRAVENOUS | Status: AC
  Administered 2013-12-31: 5 mg via INTRAVENOUS
  Filled 2013-12-31: qty 5

## 2013-12-31 MED ORDER — DILTIAZEM HCL ER COATED BEADS 240 MG PO CP24
240.0000 mg | ORAL_CAPSULE | Freq: Every day | ORAL | Status: DC
  Administered 2013-12-31 – 2014-01-01 (×2): 240 mg via ORAL
  Filled 2013-12-31 (×2): qty 1

## 2013-12-31 NOTE — Evaluation (Signed)
Clinical/Bedside Swallow Evaluation Patient Details  Name: Eileen Santana MRN: [id number] DOB: 05/30/1930  Today's Date: 12/31/2013 Time: 8:06 PM  - 8:32 PM    Past Medical History:  Past Medical History  Diagnosis Date  . History of GI bleed     Associated with NSAIDS  . Iron deficiency anemia     Transfusion dependent  . DJD (degenerative joint disease)   . Essential hypertension, benign   . History of colitis   . Ileitis   . Pancytopenia   . Autoimmune hepatitis     With leukocytoclastic vasculitis  . Heart block AV second degree March 2013  . Bronchitis   . Steroid-induced myopathy   . Renal calculus 08/12/2011  . Rheumatoid arthritis(714.0)   . Colon ulcer 04/2010    NSAID related  . Gastric ulcer 04/2010    NSAID related  . Cancer   . UGI bleed 09/20/2011    Focal area of gastritis oozing of blood  . Gastric AVM 09/20/2011  . Candida esophagitis 09/20/2011  . Paroxysmal atrial fibrillation   . Pericarditis     2D Echo EF 65%-70%  . Chronic kidney disease     Probably stage II to III with a recent acute exacerbation  . Cholestatic hepatitis   . UTI (lower urinary tract infection)     history   Past Surgical History:  Past Surgical History  Procedure Laterality Date  . Colonoscopy  04/2010  . Upper gastrointestinal endoscopy  04/2010  . Esophagogastroduodenoscopy  09/20/2011    Status post APC.  Marland Kitchen Esophagogastroduodenoscopy  09/20/2011    Procedure: ESOPHAGOGASTRODUODENOSCOPY (EGD);  Surgeon: Malissa Hippo, MD;  Location: [facility];  Service: Endoscopy;  Laterality: N/A;  . Cyst removal hand      Elbow   HPI:  [name] is an 80 year old woman with complicated past medical history as below presented with several day to one week history of cough, fatigue, generalized weakness. In the emergency department found to have atrial fibrillation with rapid ventricular response as well as right lower lobe pneumonia. Rapidly improved with empiric antibiotic  therapy, has had atrial flutter with transient heart rates up into the 150s-160s to been relatively asymptomatic. Pt with history of autoimmune hepatitis with leukocytoclastic vasculitis, pancytopenia and rheumatoid arthritis. She is followed by Dr. Karilyn Santana and tells SLP that Dr. Karilyn Santana asked her daughter if she could receive iron during this admission. She denies difficulty swallowing.   Assessment/Recommendations/Treatment Plan    SLP Assessment Clinical Impression Statement: [name] is a very pleasant woman who shows no overt signs or symptoms of aspiration during clinical examination with regular textures and thin liquids. She had one episode of coughing during initiation of po trial, however pt reports coughing intermittantly throughout the day. Pt does state that her daughter spoke with Dr. Karilyn Santana earlier today about possibly receiving iron while in acute setting. SLP stated that this information would be passed along in my note. Recommend continue diet as ordered: regular heart healthy with thin liquids. SLP will follow up x1 for diet tolerance as schedule permits. Above to RN.  Risk for Aspiration: Mild Other Related Risk Factors: History of pneumonia  Swallow Evaluation Recommendations Diet Recommendations: Regular, Thin liquid Liquid Administration via: Straw, Cup Medication Administration: Whole meds with liquid Supervision: Patient able to self feed Compensations: Small sips/bites Postural Changes and/or Swallow Maneuvers: Seated upright 90 degrees, Upright 30-60 min after meal Oral Care Recommendations: Oral care BID Other Recommendations: Clarify dietary restrictions Follow up Recommendations:  None  Treatment Plan Treatment Plan Recommendations: Therapy as outlined in treatment plan below Speech Therapy Frequency (ACUTE ONLY): min 1 x/week Treatment Duration: 1 week Interventions: Diet toleration management by SLP, Patient/family education  Prognosis Prognosis for Safe  Diet Advancement: Good  Individuals Consulted Consulted and Agree with Results and Recommendations: Patient, RN  Swallowing Goals    Pt will demonstrate safe and efficient consumption of least restrictive diet with use of strategies as needed.   Swallow Study Prior Functional Status   Lives at home, consumes regular diet with thin liquids.  General  Date of Onset: 12/29/13 HPI: [name] is an 80 year old woman with complicated past medical history as below presented with several day to one week history of cough, fatigue, generalized weakness. In the emergency department found to have atrial fibrillation with rapid ventricular response as well as right lower lobe pneumonia. Rapidly improved with empiric antibiotic therapy, has had atrial flutter with transient heart rates up into the 150s-160s to been relatively asymptomatic. Pt with history of autoimmune hepatitis with leukocytoclastic vasculitis, pancytopenia and rheumatoid arthritis. She is followed by Dr. Karilyn Santana and tells SLP that Dr. Karilyn Santana asked her daughter if she could receive iron during this admission. She denies difficulty swallowing. Type of Study: Bedside swallow evaluation Previous Swallow Assessment: None on record Diet Prior to this Study: Regular (Heart healthy) Temperature Spikes Noted: No Respiratory Status: Nasal cannula History of Recent Intubation: No Behavior/Cognition: Alert, Cooperative, Pleasant mood Oral Cavity - Dentition: Dentures, top, Dentures, bottom Self-Feeding Abilities: Able to feed self Patient Positioning: Upright in bed Baseline Vocal Quality: Clear Volitional Cough: Strong Volitional Swallow: Able to elicit  Oral Motor/Sensory Function  Overall Oral Motor/Sensory Function: Appears within functional limits for tasks assessed  Consistency Results  Ice Chips Ice chips: Not tested  Thin Liquid Thin Liquid: Within functional limits Presentation: Self Fed;Cup;Straw  Nectar Thick  Liquid Nectar Thick Liquid: Not tested  Honey Thick Liquid Honey Thick Liquid: Not tested  Puree Puree: Not tested  Solid Solid: Within functional limits Presentation: Self Fed  Thank you,  Havery Moros, CCC-SLP [phone]  Myrian Botello 12/31/2013,8:36 PM

## 2013-12-31 NOTE — Progress Notes (Signed)
Pt appears to be resting comfortably, pt was never symptomatic w/ increased HR and tolerated one time dose of IVP metoprolol well, VSS w/ exception of HR uncontrolled at this time, increased cough and last prn cough med ineffective, low bed, HOB self regulated, call bell at pt's side, will cont to monitor

## 2013-12-31 NOTE — Progress Notes (Signed)
PROGRESS NOTE  Eileen Santana SVX:[id number] DOB: 08-Jun-1930 DOA: 12/29/2013 PCP: [name], MD  Summary: 80 year old woman with complicated past medical history as below presented with several day to one week history of cough, fatigue, generalized weakness. In the emergency department found to have atrial fibrillation with rapid ventricular response as well as right lower lobe pneumonia. Rapidly improved with empiric antibiotic therapy, has had atrial flutter with transient heart rates up into the 150s-160s to been relatively asymptomatic.   Today: Plan to continue empiric antibiotics, increase Cardizem, likely transfer to floor.  Assessment/Plan: 1. Atrial fibrillation/flutter with rapid ventricular response with history of the same. Not a candidate for anticoagulation secondary to transfusion dependent anemia and recurrent GI bleed. 2. Right lower lobe pneumonia, immunocompromised, continue treatment as healthcare associated. Given location, plan speech therapy evaluation. 3. COPD. Stable. 4. 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 5. 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 6. Chronic kidney disease stage III. 7. Tobacco abuse/dependence. 8. History of GI bleed associated with NSAIDs, gastric and colonic ulcers, gastric AVM several years ago 9. History of autoimmune hepatitis with leukocytoclastic vasculitis, pancytopenia and rheumatoid arthritis   Overall appears much improved. Rapid heart rates very well tolerated, asymptomatic. Anticipate discharge in 48 hours if continues to improve.  Plan continue empiric antibiotics, follow-up speech therapy recommendations.  Increase Cardizem this a.m., monitor heart rate.  Possibly to telemetry later today.  CBC in a.m.  Code Status: full code DVT prophylaxis: SCDs Family Communication: None present. Disposition Plan: home  Brendia Sacks, MD  [facility]  Pager  [phone] If 7PM-7AM, please contact night-coverage at [email or web address], password [facility] 12/31/2013, 8:24 AM  LOS: 2 days   Consultants:    Procedures:    Antibiotics:  Zosyn 12/27 >>  Vancomycin 12/27 >>  HPI/Subjective: Atrial flutter with RVR overnight. Treated with metoprolol IV. Patient reported to be asymptomatic.  Feels better, still coughing, breathing better.   Sleeping: well Eating/GI: fine, no issues Breathing: better Pain: none Desires: no needs  Objective: Filed Vitals:   12/31/13 0400 12/31/13 0500 12/31/13 0600 12/31/13 0823  BP: 107/88 123/65 132/80   Pulse: 80 91 119   Temp:    98.4 F (36.9 C)  TempSrc:    Oral  Resp: 17 17 20    Height:      Weight:      SpO2: 96% 93% 96%     Intake/Output Summary (Last 24 hours) at 12/31/13 0824 Last data filed at 12/31/13 01/02/14  Gross per 24 hour  Intake 868.75 ml  Output    900 ml  Net -31.25 ml     Filed Weights   12/29/13 0942 12/30/13 0500  Weight: 69.996 kg (154 lb 5 oz) 70.1 kg (154 lb 8.7 oz)    Exam:     Afebrile. Heart rate 90-100s. Blood pressure stable. No hypoxia.  Gen. Appears better today. Appears calm and comfortable.  Respiratory clear to auscultation bilaterally. No wheezes, rales or rhonchi. Normal respiratory effort.  Cardiovascular irregular, tachycardic, predominantly 100s, briefly up to 160 while I was in the room. Asymptomatic. No lower extremity edema. No murmur, rub or gallop. Telemetry atrial flutter.  Musculoskeletal grossly unremarkable.  Neurologic grossly unremarkable.  Data Reviewed:  Urine output 900   Pertinent data:  12/27 COPD changes with a right lower lobe pneumonia  Pending data:  Urine culture  Blood cultures  Sputum culture   Scheduled Meds: . antiseptic oral rinse  7 mL Mouth Rinse BID  . azaTHIOprine  75 mg Oral Daily  . diltiazem  120 mg Oral Daily  . docusate sodium  100 mg Oral BID  . guaiFENesin  600 mg Oral BID  . latanoprost  1  drop Both Eyes QHS  . magnesium oxide  400 mg Oral BID  . metoprolol tartrate  25 mg Oral BID  . pantoprazole  80 mg Oral Daily  . piperacillin-tazobactam (ZOSYN)  IV  3.375 g Intravenous Q8H  . potassium chloride SA  20 mEq Oral Q M,W,F  . predniSONE  10 mg Oral Q breakfast  . sodium chloride  3 mL Intravenous Q12H  . spironolactone  50 mg Oral Daily  . ursodiol  300 mg Oral TID  . vancomycin  1,000 mg Intravenous Q24H   Continuous Infusions: . diltiazem (CARDIZEM) infusion Stopped (12/30/13 1310)    Principal Problem:   Healthcare-associated pneumonia Active Problems:   Other pancytopenia   Autoimmune hepatitis   Thrombocytopenia   Anemia   Atrial fibrillation with RVR   COPD (chronic obstructive pulmonary disease)   HCAP (healthcare-associated pneumonia)   Time spent 20 minutes

## 2013-12-31 NOTE — Evaluation (Signed)
Received report about pt/ a flutter w/ RVR. HR in 150s consistently.  HR 105 on EKG Asymptomatic  Sleeping HR in 110s-120s per nursing Came off dilt gtt yesterday On oral dilt and lopressor Am meds due @ 10am  -  IV metoprolol 5mg  IV x1 Reassess HR-follow

## 2014-01-01 ENCOUNTER — Encounter (HOSPITAL_COMMUNITY): Payer: Self-pay | Admitting: Physician Assistant

## 2014-01-01 LAB — CBC
HEMATOCRIT: 31.1 % — AB (ref 36.0–46.0)
Hemoglobin: 10 g/dL — ABNORMAL LOW (ref 12.0–15.0)
MCH: 26.4 pg (ref 26.0–34.0)
MCHC: 32.2 g/dL (ref 30.0–36.0)
MCV: 82.1 fL (ref 78.0–100.0)
Platelets: 159 10*3/uL (ref 150–400)
RBC: 3.79 MIL/uL — ABNORMAL LOW (ref 3.87–5.11)
RDW: 18 % — AB (ref 11.5–15.5)
WBC: 5.7 10*3/uL (ref 4.0–10.5)

## 2014-01-01 LAB — BASIC METABOLIC PANEL
ANION GAP: 7 (ref 5–15)
BUN: 17 mg/dL (ref 6–23)
CO2: 28 mmol/L (ref 19–32)
CREATININE: 1.15 mg/dL — AB (ref 0.50–1.10)
Calcium: 9.6 mg/dL (ref 8.4–10.5)
Chloride: 101 mEq/L (ref 96–112)
GFR calc non Af Amer: 43 mL/min — ABNORMAL LOW (ref >90)
GFR, EST AFRICAN AMERICAN: 50 mL/min — AB (ref >90)
Glucose, Bld: 96 mg/dL (ref 70–99)
Potassium: 3.7 mmol/L (ref 3.5–5.1)
Sodium: 136 mmol/L (ref 135–145)

## 2014-01-01 LAB — CULTURE, RESPIRATORY W GRAM STAIN

## 2014-01-01 LAB — CULTURE, RESPIRATORY: Culture: NORMAL

## 2014-01-01 LAB — TSH: TSH: 0.507 u[IU]/mL (ref 0.350–4.500)

## 2014-01-01 MED ORDER — DILTIAZEM HCL ER COATED BEADS 120 MG PO CP24
120.0000 mg | ORAL_CAPSULE | Freq: Once | ORAL | Status: AC
  Administered 2014-01-01: 120 mg via ORAL
  Filled 2014-01-01: qty 1

## 2014-01-01 MED ORDER — DILTIAZEM HCL ER COATED BEADS 180 MG PO CP24
360.0000 mg | ORAL_CAPSULE | Freq: Every day | ORAL | Status: DC
  Administered 2014-01-02 – 2014-01-03 (×2): 360 mg via ORAL
  Filled 2014-01-01 (×2): qty 2

## 2014-01-01 NOTE — Consult Note (Signed)
Cardiology Consultation Note  Patient ID: Eileen Santana, MRN: [id number], DOB/AGE: [birth year] 78 y.o. Admit date: 12/29/2013   Date of Consult: 01/01/2014 Primary Physician: [name], MD Primary Cardiologist: Dr. Royann Shivers at [facility] previously, was reassigned to see Dr. Purvis Sheffield 01/2014  Chief Complaint: cough, fatigue Reason for Consult: persistent atrial flutter with elevated rates  HPI: Eileen Santana is an 80 y/o F with history of PAF/flutter (not on anticoag due to recurrent GIB/IDA requiring transfusions), hypertrophic cardiomyopathy, HTN, colon/gatric ulcers, autoimmune hepatitis with leukoclastic vasculitis, prior 2nd degree AVB vs blocked PACs, CKD IIII, pancytopenia, COPD, tobacco abuse, RA, chronic prednisone use who presented to [facility] on 12/29/2013 with cough and fatigue and weakness. When she went to church on Sunday she could barely stay awake and had to stop at every pew to sit down and rest. She came to the ED and was found to have a RLL PNA as well as recurrent atrial flutter with RVR.   She was last seen in consult 06/2013 for newly recognized atrial flutter with RVR in the setting of a UTI, in NSR by time of discharge. Per that consult note, she has a "possible history of atrial fibrillation but not clearly documented." She also has h/o MAT and frequent PACs. She also has "question of previous second degree heart block, although review of cardiology notes indicates that this may have been actually blocked PACs when the patient was on verapamil." Despite high CHADSVASC score, she has not been felt to be a candidate for anticoagulation or aspirin in light of recurrent GI bleeding and iron deficiency anemia that has been transfusion dependent. (As such, she was also not a candidate for cardioversion but ended up converting to NSR on her own.). Also of note in her history, back in 2012 she developed autoimmune phenomena resulting in hepatitis as well as anemia and thrombocytopenia. Last  echo 03/2011: severe LVH suggesting possble infiltrate cardiomyopathy or advanced hypertensive heart disease, increased  echogenicity of the ventricular septum as well as the pericardium, EF >70% with end systolicmid-cavitary obliteration of the ventricle, stage 1 DD, mild MR, small IVC. No cardiac MRI available.  This admission she was treated with IV diltiazem and continued on home dose of metoprolol. Labs revealed Cr 1.43, albumin 2.5, anemia/thrombocytopenia. Weight in line with prior historical data. She was changed to oral diltiazem. Rate yesterday crept back up to 150s-160s so diltiazem was increased and she was also treated with IV metoprolol. This morning diltiazem was further increased to  daily (received  this AM, and additional  a half hour ago). BP tends to run 1-teens, pulse ox stable, afebrile. She says she is feeling much better but reports persistent cough. No CP, dyspnea, orthopnea, LEE, syncope, awareness of palpitations or recent bleeding.   Past Medical History  Diagnosis Date  . History of GI bleed     Associated with NSAIDS  . Iron deficiency anemia     Transfusion dependent  . DJD (degenerative joint disease)   . Essential hypertension, benign   . History of colitis   . Ileitis   . Pancytopenia   . Autoimmune hepatitis     With leukocytoclastic vasculitis  . Heart block AV second degree March 2013    a. Cardiology consult note 06/2013: "Question of previous second degree heart block, although review of cardiology notes indicates that this may have been actually blocked PACs when the patient was on verapamil."  . Bronchitis   . Steroid-induced myopathy   . Renal calculus 08/12/2011  .  Rheumatoid arthritis(714.0)   . Colon ulcer 04/2010    NSAID related  . Gastric ulcer 04/2010    NSAID related  . Cancer   . UGI bleed 09/20/2011    Focal area of gastritis oozing of blood  . Gastric AVM 09/20/2011  . Candida esophagitis 09/20/2011  . Paroxysmal atrial  fibrillation     Not on anticoag 2/2 hx of GIB/AVM  . Pericarditis     2D Echo EF 65%-70%  . CKD (chronic kidney disease), stage III   . Cholestatic hepatitis   . UTI (lower urinary tract infection)     history  . Paroxysmal atrial flutter     Not on anticoag 2/2 hx of GIB/AVM - dx 06/2013.  . Multifocal atrial tachycardia   . Hypertrophic cardiomyopathy     Echo 03/2011: severe LVH suggesting possble infiltrate cardiomyopathy or advanced hypertensive heart disease, increased  echogenicity of the ventricular septum as well as the pericardium, EF >70% with end systolicmid-cavitary obliteration of the ventricle, stage 1 DD, mild MR, small IVC.  Marland Kitchen Hypomagnesemia       Most Recent Cardiac Studies: 2D echo 03/2011 - Left ventricle: There was severe concentric hypertrophy. Theseptal and posterior walls measured about 1.7-1.9cm, suggesting possible infiltrative cardiomyopathy or advanced hypertensive heart disease.There isincreased echogenicity of the ventricular septum as well as the pericardium.Consider cardiac MRI for more thorough evaluation if indicated. Systolic function was vigorous. The estimated ejection fraction is >70%, with end systolic mid-cavitary obliteration of the ventricle. No regional wall motion abnormalities, however, there appears to be abnormal relaxation of the ventricle. The heart appears "stiff", especially the ventricular septum. There is Stage 1 diastolic dysfunction with an elevated E/e' ratio >15, suggesting increased LV filling pressure. - Mitral valve: Calcified annulus. Mild regurgitation. - Left atrium: Mild to moderate dilation. - Atrial septum: No defect or patent foramen ovale was identified. - Inferior vena cava: The IVC is small (<1.2 cm) and collapses spontaneously, suggesting volume depletion and an RA pressure of 2 mmHg. - Pericardium, extracardiac: There was no pericardial effusion. The pericardium is bright,  but not thickened.   Surgical History:  Past Surgical History  Procedure Laterality Date  . Colonoscopy  04/2010  . Upper gastrointestinal endoscopy  04/2010  . Esophagogastroduodenoscopy  09/20/2011    Status post APC.  Marland Kitchen Esophagogastroduodenoscopy  09/20/2011    Procedure: ESOPHAGOGASTRODUODENOSCOPY (EGD);  Surgeon: Malissa Hippo, MD;  Location: [facility];  Service: Endoscopy;  Laterality: N/A;  . Cyst removal hand      Elbow     Home Meds: Prior to Admission medications   Medication Sig Start Date End Date Taking? Authorizing Provider  acetaminophen (TYLENOL) 325 MG tablet Take 650 mg by mouth every 6 (six) hours as needed. For pain   Yes Historical Provider, MD  azaTHIOprine (IMURAN) 50 MG tablet TAKE 1 AND 1/2 TABLETS BY MOUTH ONCE DAILY 11/18/13  Yes Malissa Hippo, MD  Cholecalciferol (VITAMIN D) 2000 UNITS tablet Take 2,000 Units by mouth daily.   Yes Historical Provider, MD  Coenzyme Q10 200 MG capsule Take 200 mg by mouth daily.   Yes Historical Provider, MD  Darbepoetin Alfa (ARANESP) 500 MCG/ML SOSY injection Inject 500 mcg into the skin every 21 ( twenty-one) days.   Yes Historical Provider, MD  DEXILANT 60 MG capsule TAKE 1 CAPSULE BY MOUTH EVERY OTHER DAY 11/12/13  Yes Malissa Hippo, MD  diclofenac sodium (VOLTAREN) 1 % GEL Apply 1 application topically daily as needed (for  pain). Apply to back and legs   Yes Historical Provider, MD  diltiazem (CARDIZEM CD) 120 MG 24 hr capsule Take 1 capsule (120 mg total) by mouth daily. 06/27/13  Yes Jodelle Gross, NP  fish oil-omega-3 fatty acids 1000 MG capsule Take 1 g by mouth daily.   Yes Historical Provider, MD  folic acid (FOLVITE) 800 MCG tablet Take 800 mcg by mouth daily.    Yes Historical Provider, MD  furosemide (LASIX) 20 MG tablet Take 20 mg by mouth daily as needed. For fluid retention 04/26/11  Yes Malissa Hippo, MD  Magnesium 250 MG TABS Take 1 tablet by mouth 2 (two) times daily.   Yes Historical Provider, MD   metoprolol tartrate (LOPRESSOR) 25 MG tablet TAKE 1 TABLET BY MOUTH TWICE DAILY ** NEED APPOINTMENT FOR FURTHER REFILLS ** 12/13/13  Yes Jodelle Gross, NP  Multiple Vitamins-Minerals (MULTIVITAMIN WITH MINERALS) tablet Take 1 tablet by mouth daily.     Yes Historical Provider, MD  potassium chloride SA (K-DUR,KLOR-CON) 20 MEQ tablet TAKE 1 TABLET BY MOUTH ONCE DAILY 11/18/13  Yes Malissa Hippo, MD  predniSONE (DELTASONE) 10 MG tablet Take 1 tablet (10 mg total) by mouth daily with breakfast. 12/13/13  Yes Malissa Hippo, MD  sodium chloride 0.9 % SOLN 100 mL with ferumoxytol 510 MG/17ML SOLN 1,020 mg Inject 510 mg into the vein as needed (IF HEMOGLOBIN IS LESS THAN 10.).    Yes Historical Provider, MD  spironolactone (ALDACTONE) 50 MG tablet TAKE 1 TABLET BY MOUTH DAILY 12/11/13  Yes Malissa Hippo, MD  Travoprost, BAK Free, (TRAVATAN) 0.004 % SOLN ophthalmic solution Place 1 drop into both eyes at bedtime. 09/20/11  Yes Elliot Cousin, MD  ursodiol (ACTIGALL) 300 MG capsule Take 1 capsule by mouth 3 (three) times daily. 12/03/13  Yes Historical Provider, MD  vitamin B-12 (CYANOCOBALAMIN) 1000 MCG tablet Take 1,000 mcg by mouth daily.   Yes Historical Provider, MD  ursodiol (ACTIGALL) 250 MG tablet TAKE 2 TABLETS BY MOUTH TWICE DAILY Patient not taking: Reported on 12/29/2013    Len Blalock, NP    Inpatient Medications:  . antiseptic oral rinse  7 mL Mouth Rinse BID  . azaTHIOprine  75 mg Oral Daily  . [START ON 01/02/2014] diltiazem  360 mg Oral Daily  . docusate sodium  100 mg Oral BID  . guaiFENesin  600 mg Oral BID  . latanoprost  1 drop Both Eyes QHS  . magnesium oxide  400 mg Oral BID  . metoprolol tartrate  25 mg Oral BID  . pantoprazole  80 mg Oral Daily  . piperacillin-tazobactam (ZOSYN)  IV  3.375 g Intravenous Q8H  . potassium chloride SA  20 mEq Oral Q M,W,F  . predniSONE  10 mg Oral Q breakfast  . sodium chloride  3 mL Intravenous Q12H  . spironolactone  50 mg Oral  Daily  . ursodiol  300 mg Oral TID  . vancomycin  1,000 mg Intravenous Q24H      Allergies:  Allergies  Allergen Reactions  . Iohexol Swelling    IV Dye   . Ivp Dye [Iodinated Diagnostic Agents] Swelling    Hives  . Naprosyn [Naproxen] Hives and Itching  . Red Blood Cells Swelling and Dermatitis    With blood transfusion 2012  . Verapamil     Heart block (2nd degree) Takes Cardizem   . Vicodin [Hydrocodone-Acetaminophen] Hives and Itching  . Sulfa Antibiotics Itching and Rash  History   Social History  . Marital Status: Single    Spouse Name: N/A    Number of Children: N/A  . Years of Education: N/A   Occupational History  . Not on file.   Social History Main Topics  . Smoking status: Current Some Day Smoker -- 0.25 packs/day    Types: Cigarettes  . Smokeless tobacco: Never Used  . Alcohol Use: No  . Drug Use: No  . Sexual Activity: Not Currently   Other Topics Concern  . Not on file   Social History Narrative     Family History  Problem Relation Age of Onset  . Stroke Mother   . Hypertension Sister   . Hypertension Brother   . Heart disease Mother 83  . Heart disease Father 95  . COPD Brother   . Arthritis Brother   . Osteoporosis Sister      Review of Systems: No fever. No orthopnea. No hematuria. All other systems reviewed and are otherwise negative except as noted above.  Labs:  Lab Results  Component Value Date   WBC 5.7 01/01/2014   HGB 10.0* 01/01/2014   HCT 31.1* 01/01/2014   MCV 82.1 01/01/2014   PLT 159 01/01/2014    Recent Labs Lab 01/01/14 0505  NA 136  K 3.7  CL 101  CO2 28  BUN 17  CREATININE 1.15*  CALCIUM 9.6  GLUCOSE 96   Lab Results  Component Value Date   CHOL 196 03/06/2011   HDL 68 03/06/2011   LDLCALC 120* 03/06/2011   TRIG 41 03/06/2011     Radiology/Studies:  Dg Chest 2 View  12/29/2013   CLINICAL DATA:  Cough, history hypertension  EXAM: CHEST  2 VIEW  COMPARISON:  10/20/2013  FINDINGS:  Enlargement of cardiac silhouette.  Mediastinal contours and pulmonary vascularity normal.  Atherosclerotic calcification aorta.  Emphysematous and minimal bronchitic changes with RIGHT lower lobe consolidation consistent with pneumonia.  No pleural effusion or pneumothorax.  Bones demineralized.  IMPRESSION: COPD changes with RIGHT lower lobe pneumonia.  Chronic enlargement of cardiac silhouette.   Electronically Signed   By: Ulyses Southward M.D.   On: 12/29/2013 11:22   EKG: multiple tracings reviewed Admit EKG: atrial flutter with variable AV conduction 137bpm nonspecific changes  Physical Exam: Blood pressure 115/72, pulse 98, temperature 98.3 F (36.8 C), temperature source Oral, resp. rate 18, height 5\' 6"  (1.676 m), weight 148 lb 9.4 oz (67.4 kg), SpO2 99 %. Body mass index is 23.99 kg/(m^2). General: Well developed elderly AAF in no acute distress. Head: Normocephalic, atraumatic, sclera non-icteric, no xanthomas, nares are without discharge.  Neck: JVD not elevated. Lungs: RLL rhonchi, moderately decreased air movement. Breathing is unlabored.  Rales at bases   Heart: Irregularly irregular, tachycardic with S1 S2. No obvious murmurs, rubs, or gallops appreciated. Abdomen: Soft, non-tender, non-distended with normoactive bowel sounds. No hepatomegaly. No rebound/guarding. No obvious abdominal masses. Msk:  Strength and tone appear normal for age. Extremities: No clubbing or cyanosis. No edema.  Distal pedal pulses are 2+ and equal bilaterally. Neuro: Alert and oriented X 3. No facial asymmetry. No focal deficit. Moves all extremities spontaneously. Psych:  Responds to questions appropriately with a normal affect.   Assessment and Plan:  80 y/o F with atrial flutter 06/2013 (not on anticoag due to recurrent GIB/IDA requiring transfusions), possible h/o PAF, hypertrophic cardiomyopathy by echo 2013, HTN, colon/gatric ulcers, autoimmune hepatitis with leukoclastic vasculitis, prior 2nd degree AVB  vs blocked PACs, CKD IIII, pancytopenia,  COPD, tobacco abuse, RA, chronic prednisone use who presented to [facility] with PNA and recurrent atrial flutter with RVR. Labs showed mild AKI on CKD, chronic anemia/thrombocytopenia.  1. Persistent atrial flutter with elevated rates - likey r/t her acute illness. Her last episode in 06/2013 occurred similiarly due to medical stress in the setting of a UTI. Agree with present plan for rate control - just increased this AM. Since she is not a candidate for anticoagulation there are no plans for DCCV at present time. If the increase in dilitazem to 360mg  does not settle down her rate, would consider careful titration of her beta blocker in the setting of COPD. Her hypertrophic cardiomyopathy makes digoxin a poor choice. Will update TSH and free T4.   2. Hypertrophic cardiomyopathy - continue BB/CCB. No evidence of HF. Will d/w MD whether an updated echocardiogram is helpful. Last office note from 06/2013 indicates plan to complete echo in a year. She was due to f/u 01/09/13 with Dr. 03/09/13.  3. Essential HTN - BP controlled. Watch with rate control.  Signed, Purvis Sheffield PA-C 01/01/2014, 12:36 PM  Patient seen and examined  I have amended note above by D Dunn to reflect my findings. WIll continue to titrate meds to control HR  Patient is not a candidate for anticoagulation.  Diurese gently.     01/03/2014

## 2014-01-01 NOTE — Progress Notes (Signed)
PROGRESS NOTE  Eileen Santana PIR:[id number] DOB: 1930/06/04 DOA: 12/29/2013 PCP: [name], MD  Summary: 80 year old woman with complicated past medical history as below presented with several day to one week history of cough, fatigue, generalized weakness. In the emergency department found to have atrial fibrillation with rapid ventricular response as well as right lower lobe pneumonia. Rapidly improved with empiric antibiotic therapy, has had atrial flutter with transient heart rates up into the 150s-160s to been relatively asymptomatic.   Assessment/Plan: 1. Atrial fibrillation/flutter with rapid ventricular response with history of the same. Not a candidate for anticoagulation secondary to transfusion dependent anemia and recurrent GI bleed. Heart rate remains poorly controlled although the patient is asymptomatic. 2. Right lower lobe pneumonia, immunocompromised, continue treatment as healthcare associated. Speech therapy evaluation appreciated. Anticipate transition oral antibiotics in the morning. 3. COPD. Remains stable. 4. 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 5. 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 6. Chronic kidney disease stage III. 7. Tobacco abuse/dependence. 8. History of GI bleed associated with NSAIDs, gastric and colonic ulcers, gastric AVM several years ago 9. History of autoimmune hepatitis with leukocytoclastic vasculitis, pancytopenia and rheumatoid arthritis   Overall appears to be doing very well, plan to change to oral antibiotics in the morning.  Main issue is persistent atrial flutter with variable heart rate. Plan to increase Cardizem to 360 today, continue metoprolol.  Cardiology consult for further recs  Likely transfer to floor today if heart rate stable  Updated daughter at bedside and answered and daughter on telephone  Code Status: full code DVT prophylaxis: SCDs Family Communication:    Disposition Plan: home  Brendia Sacks, MD  [facility]  Pager [phone] If 7PM-7AM, please contact night-coverage at [email or web address], password [facility] 01/01/2014, 11:26 AM  LOS: 3 days   Consultants:  ST--regular diet, thin liquids  Procedures:    Antibiotics:  Zosyn 12/27 >>  Vancomycin 12/27 >>  HPI/Subjective: Atrial flutter with RVR.  Feels better. No chest pain, no palpitations.  Sleeping: well Eating/GI: fine, no issues Breathing: doing well Desires: no needs  Objective: Filed Vitals:   01/01/14 0800 01/01/14 0900 01/01/14 1000 01/01/14 1100  BP: 101/70 111/94 96/71 115/57  Pulse: 109 75 172 102  Temp:      TempSrc:      Resp: 27 19 37 16  Height:      Weight:      SpO2: 100% 100% 98% 100%    Intake/Output Summary (Last 24 hours) at 01/01/14 1126 Last data filed at 01/01/14 0900  Gross per 24 hour  Intake    550 ml  Output   1551 ml  Net  -1001 ml     Filed Weights   12/29/13 0942 12/30/13 0500 01/01/14 0400  Weight: 69.996 kg (154 lb 5 oz) 70.1 kg (154 lb 8.7 oz) 67.4 kg (148 lb 9.4 oz)    Exam:     Afebrile. Heart rate 90-100s. Blood pressure stable. No hypoxia.  Appears calm and comfortable. Speech fluent and clear.  Cardiovascular: Irregular, tachycardic, no m/r/g. No LE edema  Resp. Faint inspiratory crackles posteriorly. CTA anteriorly, no w/r/r. Normal resp effort, speaks in full sentences.  Data Reviewed:  Urine output 900   Pertinent data:  12/27 COPD changes with a right lower lobe pneumonia  Pending data:  Urine culture  Blood cultures  Sputum culture   Scheduled Meds: . antiseptic oral rinse  7 mL Mouth Rinse BID  . azaTHIOprine  75 mg Oral Daily  .  diltiazem  120 mg Oral Once  . [START ON 01/02/2014] diltiazem  360 mg Oral Daily  . docusate sodium  100 mg Oral BID  . guaiFENesin  600 mg Oral BID  . latanoprost  1 drop Both Eyes QHS  . magnesium oxide  400 mg Oral BID  . metoprolol tartrate  25 mg  Oral BID  . pantoprazole  80 mg Oral Daily  . piperacillin-tazobactam (ZOSYN)  IV  3.375 g Intravenous Q8H  . potassium chloride SA  20 mEq Oral Q M,W,F  . predniSONE  10 mg Oral Q breakfast  . sodium chloride  3 mL Intravenous Q12H  . spironolactone  50 mg Oral Daily  . ursodiol  300 mg Oral TID  . vancomycin  1,000 mg Intravenous Q24H   Continuous Infusions:    Principal Problem:   Healthcare-associated pneumonia Active Problems:   Other pancytopenia   Autoimmune hepatitis   Thrombocytopenia   Anemia   Atrial fibrillation with RVR   COPD (chronic obstructive pulmonary disease)   HCAP (healthcare-associated pneumonia)   Time spent 20 minutes

## 2014-01-01 NOTE — Progress Notes (Signed)
ANTIBIOTIC CONSULT NOTE  Pharmacy Consult for Vancomycin & Zosyn Indication: pneumonia  Allergies  Allergen Reactions  . Iohexol Swelling    IV Dye   . Ivp Dye [Iodinated Diagnostic Agents] Swelling    Hives  . Naprosyn [Naproxen] Hives and Itching  . Red Blood Cells Swelling and Dermatitis    With blood transfusion 2012  . Verapamil     Heart block (2nd degree) Takes Cardizem   . Vicodin [Hydrocodone-Acetaminophen] Hives and Itching  . Sulfa Antibiotics Itching and Rash    Patient Measurements: Height: 5\' 6"  (167.6 cm) Weight: 148 lb 9.4 oz (67.4 kg) IBW/kg (Calculated) : 59.3  Vital Signs: Temp: 98.3 F (36.8 C) (12/30 1140) Temp Source: Oral (12/30 1140) BP: 103/82 mmHg (12/30 1400) Pulse Rate: 96 (12/30 1400) Intake/Output from previous day: 12/29 0701 - 12/30 0700 In: 350 [IV Piggyback:350] Out: 1551 [Urine:1550; Stool:1] Intake/Output from this shift: Total I/O In: 440 [P.O.:440] Out: -   Labs:  Recent Labs  12/30/13 0523 01/01/14 0505  WBC 8.0 5.7  HGB 9.4* 10.0*  PLT 149* 159  CREATININE 1.17* 1.15*   Estimated Creatinine Clearance: 35.9 mL/min (by C-G formula based on Cr of 1.15). No results for input(s): VANCOTROUGH, VANCOPEAK, VANCORANDOM, GENTTROUGH, GENTPEAK, GENTRANDOM, TOBRATROUGH, TOBRAPEAK, TOBRARND, AMIKACINPEAK, AMIKACINTROU, AMIKACIN in the last 72 hours.   Microbiology: Recent Results (from the past 720 hour(s))  MRSA PCR Screening     Status: None   Collection Time: 12/29/13  4:40 PM  Result Value Ref Range Status   MRSA by PCR NEGATIVE NEGATIVE Final    Comment:        The GeneXpert MRSA Assay (FDA approved for NASAL specimens only), is one component of a comprehensive MRSA colonization surveillance program. It is not intended to diagnose MRSA infection nor to guide or monitor treatment for MRSA infections.   Culture, blood (routine x 2) Call MD if unable to obtain prior to antibiotics being given     Status: None  (Preliminary result)   Collection Time: 12/29/13  4:51 PM  Result Value Ref Range Status   Specimen Description BLOOD RIGHT HAND  Final   Special Requests BOTTLES DRAWN AEROBIC AND ANAEROBIC 8CC  Final   Culture NO GROWTH 3 DAYS  Final   Report Status PENDING  Incomplete  Culture, blood (routine x 2) Call MD if unable to obtain prior to antibiotics being given     Status: None (Preliminary result)   Collection Time: 12/29/13  4:52 PM  Result Value Ref Range Status   Specimen Description BLOOD LEFT ANTECUBITAL  Final   Special Requests BOTTLES DRAWN AEROBIC AND ANAEROBIC 10CC  Final   Culture NO GROWTH 3 DAYS  Final   Report Status PENDING  Incomplete  Culture, respiratory (NON-Expectorated)     Status: None   Collection Time: 12/30/13  2:24 AM  Result Value Ref Range Status   Specimen Description SPUTUM  Final   Special Requests NONE  Final   Gram Stain   Final    RARE WBC PRESENT, PREDOMINANTLY MONONUCLEAR FEW SQUAMOUS EPITHELIAL CELLS PRESENT RARE GRAM POSITIVE RODS RARE YEAST Performed at 01/01/14    Culture   Final    NORMAL OROPHARYNGEAL FLORA Note: MICROSCOPIC FINDINGS SUGGEST THAT THIS SPECIMEN IS NOT REPRESENTATIVE OF LOWER RESPIRATORY SECRETIONS. PLEASE RECOLLECT. Performed at [facility]    Report Status 01/01/2014 FINAL  Final  Culture, Urine     Status: None   Collection Time: 12/30/13  6:09 AM  Result Value Ref Range Status   Specimen Description URINE, CLEAN CATCH  Final   Special Requests NONE  Final   Colony Count NO GROWTH Performed at [facility]   Final   Culture NO GROWTH Performed at [facility]   Final   Report Status 12/31/2013 FINAL  Final   Anti-infectives    Start     Dose/Rate Route Frequency Ordered Stop   12/30/13 1200  vancomycin (VANCOCIN) IVPB 1000 mg/200 mL premix     1,000 mg200 mL/hr over 60 Minutes Intravenous Every 24 hours 12/29/13 1659     12/29/13 2200  piperacillin-tazobactam (ZOSYN) IVPB  3.375 g     3.375 g12.5 mL/hr over 240 Minutes Intravenous Every 8 hours 12/29/13 1659     12/29/13 1200  vancomycin (VANCOCIN) IVPB 1000 mg/200 mL premix     1,000 mg200 mL/hr over 60 Minutes Intravenous  Once 12/29/13 1148 12/29/13 1452   12/29/13 1145  ceFEPIme (MAXIPIME) 1 g in dextrose 5 % 50 mL IVPB     1 g100 mL/hr over 30 Minutes Intravenous  Once 12/29/13 1131 12/29/13 1245     Assessment: 80 yo Right lower lobe pneumonia, immunocompromised, continue treatment as healthcare associated.  Anticipate transition to oral antibiotics in AM per MD note.  Vancomycin 12/27>> Zosyn 12/27>>  Goal of Therapy:  Vancomycin trough level 15-20 mcg/ml  Plan:  Zosyn 3.375gm IV Q8h to be infused over 4hrs Vancomycin 1gm IV q24h Check Vancomycin trough at steady state (defer for now since plan to switch to oral abx). Monitor renal function and cx data   Mady Gemma 01/01/2014,2:19 PM

## 2014-01-01 NOTE — Progress Notes (Signed)
Speech Language Pathology Treatment: Dysphagia  Patient Details Name: Eileen Santana MRN: [id number] DOB: 1930-07-06 Today's Date: 01/01/2014 Time: 7619-5093 SLP Time Calculation (min) (ACUTE ONLY): 10 min  Assessment / Plan / Recommendation Clinical Impression  F/u after last night's swallow eval - Pt eating lunch; tolerating regular foods, thin liquids from straw with functional swallow.  There is sufficient coordination within swallow/ventilatory cycle.  There are no overt nor subtle s/s of aspiration.  Recommend continuing current diet - pt appears safe with PO intake.  No further slp f/u warranted.  Will sign off.     HPI HPI: Eileen Santana is an 80 year old woman with complicated past medical history as below presented with several day to one week history of cough, fatigue, generalized weakness. In the emergency department found to have atrial fibrillation with rapid ventricular response as well as right lower lobe pneumonia. Rapidly improved with empiric antibiotic therapy, has had atrial flutter with transient heart rates up into the 150s-160s to been relatively asymptomatic. Pt with history of autoimmune hepatitis with leukocytoclastic vasculitis, pancytopenia and rheumatoid arthritis. She is followed by Dr. Laural Golden and tells SLP that Dr. Laural Golden asked her daughter if she could receive iron during this admission. She denies difficulty swallowing.   Pertinent Vitals Pain Assessment: No/denies pain  SLP Plan  All goals met    Recommendations Diet recommendations: Regular;Thin liquid Liquids provided via: Cup;Straw Medication Administration: Whole meds with liquid Supervision: Patient able to self feed Compensations: Small sips/bites              Oral Care Recommendations: Oral care BID Follow up Recommendations: None Plan: All goals met   Rexanne Inocencio L. Tivis Ringer, [place] CCC/SLP Pager [phone]      Juan Quam Laurice 01/01/2014, 12:40 PM

## 2014-01-02 DIAGNOSIS — J438 Other emphysema: Secondary | ICD-10-CM

## 2014-01-02 DIAGNOSIS — I4891 Unspecified atrial fibrillation: Secondary | ICD-10-CM | POA: Insufficient documentation

## 2014-01-02 DIAGNOSIS — I422 Other hypertrophic cardiomyopathy: Secondary | ICD-10-CM

## 2014-01-02 DIAGNOSIS — I4892 Unspecified atrial flutter: Secondary | ICD-10-CM | POA: Diagnosis present

## 2014-01-02 LAB — T4, FREE: Free T4: 1.52 ng/dL (ref 0.80–1.80)

## 2014-01-02 MED ORDER — FUROSEMIDE 10 MG/ML IJ SOLN
20.0000 mg | Freq: Once | INTRAMUSCULAR | Status: AC
  Administered 2014-01-02: 20 mg via INTRAVENOUS
  Filled 2014-01-02: qty 2

## 2014-01-02 MED ORDER — METOPROLOL TARTRATE 25 MG PO TABS
12.5000 mg | ORAL_TABLET | Freq: Once | ORAL | Status: AC
  Administered 2014-01-02: 12.5 mg via ORAL
  Filled 2014-01-02: qty 1

## 2014-01-02 MED ORDER — ENOXAPARIN SODIUM 40 MG/0.4ML ~~LOC~~ SOLN
40.0000 mg | SUBCUTANEOUS | Status: DC
  Administered 2014-01-02 – 2014-01-06 (×5): 40 mg via SUBCUTANEOUS
  Filled 2014-01-02 (×5): qty 0.4

## 2014-01-02 MED ORDER — SODIUM CHLORIDE 0.9 % IV SOLN
510.0000 mg | Freq: Once | INTRAVENOUS | Status: AC
  Administered 2014-01-02: 510 mg via INTRAVENOUS
  Filled 2014-01-02: qty 17

## 2014-01-02 MED ORDER — METOPROLOL TARTRATE 25 MG PO TABS
37.5000 mg | ORAL_TABLET | Freq: Two times a day (BID) | ORAL | Status: DC
  Administered 2014-01-02 – 2014-01-03 (×3): 37.5 mg via ORAL
  Filled 2014-01-02 (×4): qty 2

## 2014-01-02 MED ORDER — AMOXICILLIN-POT CLAVULANATE 875-125 MG PO TABS
1.0000 | ORAL_TABLET | Freq: Two times a day (BID) | ORAL | Status: DC
Start: 2014-01-02 — End: 2014-01-06
  Administered 2014-01-02 – 2014-01-06 (×9): 1 via ORAL
  Filled 2014-01-02 (×12): qty 1

## 2014-01-02 NOTE — Progress Notes (Signed)
PROGRESS NOTE  Eileen Santana GGY:[id number] DOB: 1930/11/28 DOA: 12/29/2013 PCP: [name], MD  Summary: 80 year old woman with complicated past medical history as below presented with several day to one week history of cough, fatigue, generalized weakness. In the emergency department found to have atrial fibrillation with rapid ventricular response as well as right lower lobe pneumonia. Rapidly improved with empiric antibiotic therapy, has had atrial flutter with transient heart rates up into the 150s-160s to been relatively asymptomatic which has been difficult to control. Titrated up diltiazem, now titrating up metoprolol. Cardiology involved. Home when HR controlled.  Assessment/Plan: 1. Atrial fibrillation/flutter with rapid ventricular response with history of the same. Not a candidate for anticoagulation secondary to transfusion dependent anemia and recurrent GI bleed. Asymptomatic, HR improved but still over 100. 2. Right lower lobe pneumonia, immunocompromised, appears clinically resolved. Speech therapy evaluation appreciated.  3. COPD. stable 4. 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 5. 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 Platelet count normal today. 6. Chronic kidney disease stage III. Remains stable. 7. Tobacco abuse/dependence. 8. History of GI bleed associated with NSAIDs, gastric and colonic ulcers, gastric AVM several years ago 9. History of autoimmune hepatitis with leukocytoclastic vasculitis, pancytopenia and rheumatoid arthritis   Rate better, but still fast. Will increase metoprolol (increased diltiazem yesterday). COPD stable.  Pneumonia improving rapidly. Change to oral antibiotics today (Augmentin).   Transfer to floor  Code Status: full code DVT prophylaxis: SCDs Family Communication:  Disposition Plan: home  Brendia Sacks, MD  [facility]  Pager [phone] If 7PM-7AM, please  contact night-coverage at [email or web address], password [facility] 01/02/2014, 8:27 AM  LOS: 4 days   Consultants:  Cardiology  ST--regular diet, thin liquids  Procedures:    Antibiotics:  Zosyn 12/27 >> 12/31  Vancomycin 12/27 >> 12/31  Augmentin 12/31 >>  HPI/Subjective: Evaluated by cardiology, monitoring, no new recommendations at this time.  Feels better, no complaints, no chest pain, no palpitations.   Sleeping: very good Eating/GI: well, no n/v/pain Breathing: well, no problems Pain: none Desires: none  Objective: Filed Vitals:   01/02/14 0300 01/02/14 0400 01/02/14 0500 01/02/14 0700  BP: 117/74 143/74 121/73 127/69  Pulse: 92 110 96 127  Temp:  98.5 F (36.9 C)    TempSrc:  Oral    Resp: 26 20 18 21   Height:      Weight:   67.6 kg (149 lb 0.5 oz)   SpO2: 89% 96% 96% 94%    Intake/Output Summary (Last 24 hours) at 01/02/14 0827 Last data filed at 01/02/14 0600  Gross per 24 hour  Intake    830 ml  Output    400 ml  Net    430 ml     Filed Weights   12/30/13 0500 01/01/14 0400 01/02/14 0500  Weight: 70.1 kg (154 lb 8.7 oz) 67.4 kg (148 lb 9.4 oz) 67.6 kg (149 lb 0.5 oz)    Exam:    Afebrile, vital signs stable, intermittent tachycardia. General:  Appears calm and comfortable Cardiovascular: RRR, no m/r/g. No LE edema. Respiratory: CTA bilaterally, no w/r/r. Normal respiratory effort. Speaks in full sentences Abdomen: soft, ntnd Musculoskeletal: grossly normal tone BLE Psychiatric: grossly normal mood and affect, speech fluent and appropriate  Data Reviewed:  I/O balanced  TSH normal, free T4 normal  Basic metabolic panel unremarkable  CBC unremarkable, normal platelet count, hemoglobin 10  Pertinent data:  12/27 COPD changes with a right lower lobe pneumonia  TSH normal, free T4 normal  Urine culture no growth, final  Sputum culture no growth, final  Pending data:  Blood cultures, no growth 3 days  Scheduled Meds: . antiseptic  oral rinse  7 mL Mouth Rinse BID  . azaTHIOprine  75 mg Oral Daily  . diltiazem  360 mg Oral Daily  . docusate sodium  100 mg Oral BID  . guaiFENesin  600 mg Oral BID  . latanoprost  1 drop Both Eyes QHS  . magnesium oxide  400 mg Oral BID  . metoprolol tartrate  25 mg Oral BID  . pantoprazole  80 mg Oral Daily  . piperacillin-tazobactam (ZOSYN)  IV  3.375 g Intravenous Q8H  . potassium chloride SA  20 mEq Oral Q M,W,F  . predniSONE  10 mg Oral Q breakfast  . sodium chloride  3 mL Intravenous Q12H  . spironolactone  50 mg Oral Daily  . ursodiol  300 mg Oral TID  . vancomycin  1,000 mg Intravenous Q24H   Continuous Infusions:    Principal Problem:   Healthcare-associated pneumonia Active Problems:   Other pancytopenia   Autoimmune hepatitis   Thrombocytopenia   Anemia   Atrial fibrillation with RVR   COPD (chronic obstructive pulmonary disease)   HCAP (healthcare-associated pneumonia)   Time spent 20 minutes

## 2014-01-02 NOTE — Care Management Utilization Note (Signed)
UR complete 

## 2014-01-02 NOTE — Progress Notes (Addendum)
Consulting cardiologist: Eileen Pates MD Primary Cardiologist: Prentice Docker MD  Cardiology Specific Problem List: 1.  PAF/Flutter 2. HOCM 3. Hypertension   Subjective:   Feels cold and stiff. Continues to cough a lot but no chest pain or shortness of breath.    Objective:   Temp:  [98 F (36.7 C)-98.5 F (36.9 C)] 98.5 F (36.9 C) (12/31 0400) Pulse Rate:  [46-172] 127 (12/31 0700) Resp:  [16-37] 21 (12/31 0700) BP: (96-158)/(57-107) 127/69 mmHg (12/31 0700) SpO2:  [84 %-100 %] 94 % (12/31 0700) Weight:  [149 lb 0.5 oz (67.6 kg)] 149 lb 0.5 oz (67.6 kg) (12/31 0500) Last BM Date: 01/01/14  Filed Weights   12/30/13 0500 01/01/14 0400 01/02/14 0500  Weight: 154 lb 8.7 oz (70.1 kg) 148 lb 9.4 oz (67.4 kg) 149 lb 0.5 oz (67.6 kg)    Intake/Output Summary (Last 24 hours) at 01/02/14 0831 Last data filed at 01/02/14 0600  Gross per 24 hour  Intake    830 ml  Output    400 ml  Net    430 ml    Telemetry:Atrial fib, rates up to 141 bpm this am. Ranging between 90-140 at rest.  Exam:  General: No acute distress.  HEENT: Conjunctiva and lids normal, oropharynx clear.  Lungs: Inspiratory rhonchi L base.  Ralaes bilat bases . Occasional congested, productive coughing.    Cardiac: No elevated JVP or bruits. IRRR, rapid, no gallop or rub.   Abdomen: Normoactive bowel sounds, nontender, nondistended.  Extremities: No pitting edema, distal pulses full.  Neuropsychiatric: Alert and oriented x3, affect appropriate.   Lab Results:  Basic Metabolic Panel:  Recent Labs Lab 12/29/13 1058 12/30/13 0523 01/01/14 0505  NA 137 134* 136  K 3.8 4.2 3.7  CL 103 103 101  CO2 27 26 28   GLUCOSE 128* 127* 96  BUN 22 22 17   CREATININE 1.30* 1.17* 1.15*  CALCIUM 10.3 9.9 9.6   CBC:  Recent Labs Lab 12/29/13 1058 12/30/13 0523 01/01/14 0505  WBC 7.0 8.0 5.7  HGB 10.3* 9.4* 10.0*  HCT 31.1* 28.2* 31.1*  MCV 82.3 81.5 82.1  PLT 138* 149* 159     BNP:  Recent Labs  06/11/13 1339  PROBNP 1496.0*     Medications:   Scheduled Medications: . antiseptic oral rinse  7 mL Mouth Rinse BID  . azaTHIOprine  75 mg Oral Daily  . diltiazem  360 mg Oral Daily  . docusate sodium  100 mg Oral BID  . guaiFENesin  600 mg Oral BID  . latanoprost  1 drop Both Eyes QHS  . magnesium oxide  400 mg Oral BID  . metoprolol tartrate  25 mg Oral BID  . pantoprazole  80 mg Oral Daily  . piperacillin-tazobactam (ZOSYN)  IV  3.375 g Intravenous Q8H  . potassium chloride SA  20 mEq Oral Q M,W,F  . predniSONE  10 mg Oral Q breakfast  . sodium chloride  3 mL Intravenous Q12H  . spironolactone  50 mg Oral Daily  . ursodiol  300 mg Oral TID  . vancomycin  1,000 mg Intravenous Q24H          PRN Medications:  sodium chloride, benzonatate, ondansetron **OR** ondansetron (ZOFRAN) IV, sodium chloride   Assessment and Plan:   1. Atrial flutter with RVR: Not a candidate for anticoagulation. Heart rate is not well controlled. Infection may be contributing. Continues on PO diltiazem at 360 mg daily, metoprolol is at 25 mg BID. BP  is stable. Will increase metoprolol to 37.5 mg BID today and watch HR and BP response.  2. Hypertrophic CM: EF > 70%. Continue medical Rx for better rate control  Would give lasix 20 x 1  Follow response.   3. Hx of Hypertension: SBP 100 to 110s  Follow as titrate meds    Bettey Mare. Lawrence NP [facility]  01/02/2014, 8:31 AM Pat[place] seen and examined  I have amended note to reflect my findings    Eileen Santana

## 2014-01-03 ENCOUNTER — Inpatient Hospital Stay (HOSPITAL_COMMUNITY): Payer: Medicare Other

## 2014-01-03 DIAGNOSIS — D638 Anemia in other chronic diseases classified elsewhere: Secondary | ICD-10-CM

## 2014-01-03 DIAGNOSIS — I4892 Unspecified atrial flutter: Principal | ICD-10-CM

## 2014-01-03 LAB — BASIC METABOLIC PANEL
ANION GAP: 7 (ref 5–15)
BUN: 20 mg/dL (ref 6–23)
CALCIUM: 9.7 mg/dL (ref 8.4–10.5)
CO2: 30 mmol/L (ref 19–32)
CREATININE: 1.19 mg/dL — AB (ref 0.50–1.10)
Chloride: 99 mEq/L (ref 96–112)
GFR calc Af Amer: 48 mL/min — ABNORMAL LOW (ref >90)
GFR, EST NON AFRICAN AMERICAN: 42 mL/min — AB (ref >90)
Glucose, Bld: 105 mg/dL — ABNORMAL HIGH (ref 70–99)
Potassium: 3.7 mmol/L (ref 3.5–5.1)
Sodium: 136 mmol/L (ref 135–145)

## 2014-01-03 LAB — BRAIN NATRIURETIC PEPTIDE: B Natriuretic Peptide: 383 pg/mL — ABNORMAL HIGH (ref 0.0–100.0)

## 2014-01-03 MED ORDER — ALBUTEROL SULFATE (2.5 MG/3ML) 0.083% IN NEBU
INHALATION_SOLUTION | RESPIRATORY_TRACT | Status: AC
  Filled 2014-01-03: qty 3

## 2014-01-03 MED ORDER — LEVALBUTEROL HCL 0.63 MG/3ML IN NEBU
0.6300 mg | INHALATION_SOLUTION | Freq: Three times a day (TID) | RESPIRATORY_TRACT | Status: DC
  Administered 2014-01-03 – 2014-01-04 (×4): 0.63 mg via RESPIRATORY_TRACT
  Filled 2014-01-03 (×4): qty 3

## 2014-01-03 MED ORDER — FUROSEMIDE 20 MG PO TABS
20.0000 mg | ORAL_TABLET | Freq: Two times a day (BID) | ORAL | Status: DC
  Administered 2014-01-03 – 2014-01-06 (×6): 20 mg via ORAL
  Filled 2014-01-03 (×6): qty 1

## 2014-01-03 MED ORDER — LEVALBUTEROL HCL 0.63 MG/3ML IN NEBU: 0.6300 mg | INHALATION_SOLUTION | Freq: Four times a day (QID) | RESPIRATORY_TRACT | Status: DC | PRN

## 2014-01-03 NOTE — Plan of Care (Signed)
Problem: Phase II Progression Outcomes Goal: Ventricular heart rate < 100/min Outcome: Not Progressing HR REMAINS IN ATRIAL FLUTTER, W/ RATE LOW 100'S TO 140'S Goal: Progress activity as tolerated unless otherwise ordered Outcome: Not Progressing HR RISES W/ ANY ACTIVITY

## 2014-01-03 NOTE — Progress Notes (Signed)
PROGRESS NOTE  Eileen Santana ZOX:[id number] DOB: [birth year] DOA: 12/29/2013 PCP: [name], MD  HPI/Recap of past 37 hours: 81 year old female with past mental history of COPD, autoimmune disease and chronic kidney disease who was admitted on 12/27 after 1 week of coughing and weakness and found to have a right lower lobe pneumonia plus rapid atrial fibrillation. We'll patient is improved from a pneumonia standpoint, she continues to have episodes of atrial flutter with transient heart rates as high as in the 150s. Cardizem and metoprolol have been used in cardiology has been consulted although heart rates have been difficult to control given soft blood pressures.  Assessment/Plan: Principal Problem:   Atrial flutter with rapid ventricular response: Soft blood pressures persisting. Have added nebulizers for better oxygenation in hopes to decrease heart rate. BNP checked and found to be at 384 Active Problems:   Other pancytopenia   Autoimmune hepatitis: Stable   Thrombocytopenia: Last platelet count on 12/30 stable at 159   Anemia: Stable, from chronic disease. Hemoglobin ranging between 9-10   Healthcare-associated pneumonia: In about exchanged over to by mouth, tolerating well. Repeat chest x-ray done 1/1 shows significant improvement   COPD (chronic obstructive pulmonary disease): Looks to be at baseline    Code Status: Full code  Family Communication: Plan discussed with family at the bedside  Disposition Plan: Transfer out of unit once heart rate consistently stays around the 100   Consultants:  Cardiology  Procedures:  None  Antibiotics:  Zosyn 12/27-12/31  Vancomycin 12/27-12/31  Augmentin 12/31-present   Objective: BP 108/71 mmHg  Pulse 97  Temp(Src) 98.5 F (36.9 C) (Oral)  Resp 21  Ht 5\' 6"  (1.676 m)  Wt 67.7 kg (149 lb 4 oz)  BMI 24.10 kg/m2  SpO2 97%  Intake/Output Summary (Last 24 hours) at 01/03/14 1629 Last data filed at 01/03/14 1230  Gross per 24 hour  Intake   1257 ml  Output   1200 ml  Net     57 ml   Filed Weights   01/01/14 0400 01/02/14 0500 01/03/14 0500  Weight: 67.4 kg (148 lb 9.4 oz) 67.6 kg (149 lb 0.5 oz) 67.7 kg (149 lb 4 oz)    Exam:   General:  Alert and oriented 3, no acute distress  Cardiovascular: Irregular rhythm, tachycardic  Respiratory: Decreased breath sounds throughout  Abdomen: Soft, nontender, nondistended, positive bowel sounds  Musculoskeletal: No clubbing or cyanosis, trace edema   Data Reviewed: Basic Metabolic Panel:  Recent Labs Lab 12/29/13 1058 12/30/13 0523 01/01/14 0505 01/03/14 0516  NA 137 134* 136 136  K 3.8 4.2 3.7 3.7  CL 103 103 101 99  CO2 27 26 28 30   GLUCOSE 128* 127* 96 105*  BUN 22 22 17 20   CREATININE 1.30* 1.17* 1.15* 1.19*  CALCIUM 10.3 9.9 9.6 9.7   Liver Function Tests: No results for input(s): AST, ALT, ALKPHOS, BILITOT, PROT, ALBUMIN in the last 168 hours. No results for input(s): LIPASE, AMYLASE in the last 168 hours. No results for input(s): AMMONIA in the last 168 hours. CBC:  Recent Labs Lab 12/29/13 1058 12/30/13 0523 01/01/14 0505  WBC 7.0 8.0 5.7  NEUTROABS 5.8  --   --   HGB 10.3* 9.4* 10.0*  HCT 31.1* 28.2* 31.1*  MCV 82.3 81.5 82.1  PLT 138* 149* 159   Cardiac Enzymes:   No results for input(s): CKTOTAL, CKMB, CKMBINDEX, TROPONINI in the last 168 hours. BNP (last 3 results)  Recent Labs  06/11/13 1339  PROBNP 1496.0*   CBG: No results for input(s): GLUCAP in the last 168 hours.  Recent Results (from the past 240 hour(s))  MRSA PCR Screening     Status: None   Collection Time: 12/29/13  4:40 PM  Result Value Ref Range Status   MRSA by PCR NEGATIVE NEGATIVE Final    Comment:        The GeneXpert MRSA Assay (FDA approved for NASAL specimens only), is one component of a comprehensive MRSA colonization surveillance program. It is not intended to diagnose MRSA infection nor to guide or monitor treatment  for MRSA infections.   Culture, blood (routine x 2) Call MD if unable to obtain prior to antibiotics being given     Status: None (Preliminary result)   Collection Time: 12/29/13  4:51 PM  Result Value Ref Range Status   Specimen Description BLOOD RIGHT HAND  Final   Special Requests BOTTLES DRAWN AEROBIC AND ANAEROBIC 8CC  Final   Culture NO GROWTH 4 DAYS  Final   Report Status PENDING  Incomplete  Culture, blood (routine x 2) Call MD if unable to obtain prior to antibiotics being given     Status: None (Preliminary result)   Collection Time: 12/29/13  4:52 PM  Result Value Ref Range Status   Specimen Description BLOOD LEFT ANTECUBITAL  Final   Special Requests BOTTLES DRAWN AEROBIC AND ANAEROBIC 10CC  Final   Culture NO GROWTH 4 DAYS  Final   Report Status PENDING  Incomplete  Culture, respiratory (NON-Expectorated)     Status: None   Collection Time: 12/30/13  2:24 AM  Result Value Ref Range Status   Specimen Description SPUTUM  Final   Special Requests NONE  Final   Gram Stain   Final    RARE WBC PRESENT, PREDOMINANTLY MONONUCLEAR FEW SQUAMOUS EPITHELIAL CELLS PRESENT RARE GRAM POSITIVE RODS RARE YEAST Performed at [facility]    Culture   Final    NORMAL OROPHARYNGEAL FLORA Note: MICROSCOPIC FINDINGS SUGGEST THAT THIS SPECIMEN IS NOT REPRESENTATIVE OF LOWER RESPIRATORY SECRETIONS. PLEASE RECOLLECT. Performed at [facility]    Report Status 01/01/2014 FINAL  Final  Culture, Urine     Status: None   Collection Time: 12/30/13  6:09 AM  Result Value Ref Range Status   Specimen Description URINE, CLEAN CATCH  Final   Special Requests NONE  Final   Colony Count NO GROWTH Performed at [facility]   Final   Culture NO GROWTH Performed at [facility]   Final   Report Status 12/31/2013 FINAL  Final     Studies: No results found.  Scheduled Meds: . amoxicillin-clavulanate  1 tablet Oral Q12H  . antiseptic oral rinse  7 mL Mouth Rinse  BID  . azaTHIOprine  75 mg Oral Daily  . diltiazem  360 mg Oral Daily  . docusate sodium  100 mg Oral BID  . enoxaparin (LOVENOX) injection  40 mg Subcutaneous Q24H  . furosemide  20 mg Oral BID  . guaiFENesin  600 mg Oral BID  . latanoprost  1 drop Both Eyes QHS  . levalbuterol  0.63 mg Nebulization TID  . magnesium oxide  400 mg Oral BID  . metoprolol tartrate  37.5 mg Oral BID  . pantoprazole  80 mg Oral Daily  . potassium chloride SA  20 mEq Oral Q M,W,F  . predniSONE  10 mg Oral Q breakfast  . sodium chloride  3 mL Intravenous Q12H  . spironolactone  50 mg Oral Daily  . ursodiol  300 mg Oral TID    Continuous Infusions:    Time spent: 35 minutes  Hollice Espy  [facility] Pager [phone]. If 7PM-7AM, please contact night-coverage at [email or web address], password [facility] 01/03/2014, 4:29 PM  LOS: 5 days

## 2014-01-04 LAB — CULTURE, BLOOD (ROUTINE X 2)
CULTURE: NO GROWTH
Culture: NO GROWTH

## 2014-01-04 MED ORDER — METOPROLOL TARTRATE 50 MG PO TABS
50.0000 mg | ORAL_TABLET | Freq: Two times a day (BID) | ORAL | Status: DC
  Administered 2014-01-04 – 2014-01-05 (×3): 50 mg via ORAL
  Filled 2014-01-04 (×3): qty 1

## 2014-01-04 MED ORDER — DILTIAZEM HCL ER COATED BEADS 180 MG PO CP24
360.0000 mg | ORAL_CAPSULE | Freq: Every day | ORAL | Status: DC
  Administered 2014-01-04 – 2014-01-06 (×3): 360 mg via ORAL
  Filled 2014-01-04 (×3): qty 2

## 2014-01-04 MED ORDER — METOPROLOL TARTRATE 1 MG/ML IV SOLN
2.5000 mg | INTRAVENOUS | Status: AC
  Administered 2014-01-04: 2.5 mg via INTRAVENOUS
  Filled 2014-01-04: qty 5

## 2014-01-04 MED ORDER — URSODIOL 300 MG PO CAPS
300.0000 mg | ORAL_CAPSULE | Freq: Two times a day (BID) | ORAL | Status: DC
  Administered 2014-01-04 – 2014-01-06 (×5): 300 mg via ORAL
  Filled 2014-01-04 (×9): qty 1

## 2014-01-04 MED ORDER — DILTIAZEM HCL ER 60 MG PO CP12
120.0000 mg | ORAL_CAPSULE | Freq: Two times a day (BID) | ORAL | Status: DC
  Administered 2014-01-04: 120 mg via ORAL
  Filled 2014-01-04 (×5): qty 2

## 2014-01-04 NOTE — Progress Notes (Signed)
PROGRESS NOTE  Eileen Santana DSK:[id number] DOB: 1930/02/20 DOA: 12/29/2013 PCP: [name], MD  HPI/Recap of past 26 hours: 81 year old female with past mental history of COPD, autoimmune disease and chronic kidney disease who was admitted on 12/27 after 1 week of coughing and weakness and found to have a right lower lobe pneumonia plus rapid atrial fibrillation. We'll patient is improved from a pneumonia standpoint, she continues to have episodes of atrial flutter with transient heart rates as high as in the 150s. Cardizem and metoprolol have been used in cardiology has been consulted although heart rates have been difficult to control given soft blood pressures.  In the past 24 hours, patient has had some improvement with mild diuresis, nebulizers. Heart rate while still at times elevated has much improved. Patient herself feeling somewhat better, less palpitations. Still weak  Assessment/Plan: Principal Problem:   Atrial flutter with rapid ventricular response: Soft blood pressures persisting. Have added nebulizers for better oxygenation in hopes to decrease heart rate. BNP checked and found to be at 384. We'll titrate up metoprolol and change Cardizem to twice a day as she seems to have a better heart rate during the day and then started to come up last night and into this morning.  Transfer to floor and add incentive spirometry Active Problems:   Other pancytopenia   Autoimmune hepatitis: Stable    Thrombocytopenia: Last platelet count on 12/30 stable at 159    Anemia: Stable, from chronic disease. Hemoglobin ranging between 9-10    Healthcare-associated pneumonia: Antibiotics changed over to by mouth, tolerating well. Repeat chest x-ray done 1/1 shows significant improvement. Completed antibiotic course 1/2    COPD (chronic obstructive pulmonary disease): Looks to be at baseline  Code Status: Full code  Family Communication: Plan discussed with family at the  bedside  Disposition Plan: Transfer to floor. Home once heart rate able to stay well controlled   Consultants:  Cardiology  Procedures:  None  Antibiotics:  Zosyn 12/27-12/31  Vancomycin 12/27-12/31  Augmentin 12/31-1/2   Objective: BP 118/61 mmHg  Pulse 120  Temp(Src) 98.4 F (36.9 C) (Oral)  Resp 19  Ht 5\' 6"  (1.676 m)  Wt 68.992 kg (152 lb 1.6 oz)  BMI 24.56 kg/m2  SpO2 97%  Intake/Output Summary (Last 24 hours) at 01/04/14 1159 Last data filed at 01/04/14 0900  Gross per 24 hour  Intake   1320 ml  Output   1200 ml  Net    120 ml   Filed Weights   01/02/14 0500 01/03/14 0500 01/04/14 0500  Weight: 67.6 kg (149 lb 0.5 oz) 67.7 kg (149 lb 4 oz) 68.992 kg (152 lb 1.6 oz)    Exam:   General:  Alert and oriented 3, no acute distress  Cardiovascular: Irregular rhythm, tachycardic-improved  Respiratory: Decreased breath sounds throughout  Abdomen: Soft, nontender, nondistended, positive bowel sounds  Musculoskeletal: No clubbing or cyanosis, trace edema   Data Reviewed: Basic Metabolic Panel:  Recent Labs Lab 12/29/13 1058 12/30/13 0523 01/01/14 0505 01/03/14 0516  NA 137 134* 136 136  K 3.8 4.2 3.7 3.7  CL 103 103 101 99  CO2 27 26 28 30   GLUCOSE 128* 127* 96 105*  BUN 22 22 17 20   CREATININE 1.30* 1.17* 1.15* 1.19*  CALCIUM 10.3 9.9 9.6 9.7   Liver Function Tests: No results for input(s): AST, ALT, ALKPHOS, BILITOT, PROT, ALBUMIN in the last 168 hours. No results for input(s): LIPASE, AMYLASE in the last 168 hours. No results for  input(s): AMMONIA in the last 168 hours. CBC:  Recent Labs Lab 12/29/13 1058 12/30/13 0523 01/01/14 0505  WBC 7.0 8.0 5.7  NEUTROABS 5.8  --   --   HGB 10.3* 9.4* 10.0*  HCT 31.1* 28.2* 31.1*  MCV 82.3 81.5 82.1  PLT 138* 149* 159   Cardiac Enzymes:   No results for input(s): CKTOTAL, CKMB, CKMBINDEX, TROPONINI in the last 168 hours. BNP (last 3 results)  Recent Labs  06/11/13 1339  PROBNP  1496.0*   CBG: No results for input(s): GLUCAP in the last 168 hours.  Recent Results (from the past 240 hour(s))  MRSA PCR Screening     Status: None   Collection Time: 12/29/13  4:40 PM  Result Value Ref Range Status   MRSA by PCR NEGATIVE NEGATIVE Final    Comment:        The GeneXpert MRSA Assay (FDA approved for NASAL specimens only), is one component of a comprehensive MRSA colonization surveillance program. It is not intended to diagnose MRSA infection nor to guide or monitor treatment for MRSA infections.   Culture, blood (routine x 2) Call MD if unable to obtain prior to antibiotics being given     Status: None   Collection Time: 12/29/13  4:51 PM  Result Value Ref Range Status   Specimen Description BLOOD RIGHT HAND  Final   Special Requests BOTTLES DRAWN AEROBIC AND ANAEROBIC 8CC  Final   Culture NO GROWTH 6 DAYS  Final   Report Status 01/04/2014 FINAL  Final  Culture, blood (routine x 2) Call MD if unable to obtain prior to antibiotics being given     Status: None   Collection Time: 12/29/13  4:52 PM  Result Value Ref Range Status   Specimen Description BLOOD LEFT ANTECUBITAL  Final   Special Requests BOTTLES DRAWN AEROBIC AND ANAEROBIC 10CC  Final   Culture NO GROWTH 6 DAYS  Final   Report Status 01/04/2014 FINAL  Final  Culture, respiratory (NON-Expectorated)     Status: None   Collection Time: 12/30/13  2:24 AM  Result Value Ref Range Status   Specimen Description SPUTUM  Final   Special Requests NONE  Final   Gram Stain   Final    RARE WBC PRESENT, PREDOMINANTLY MONONUCLEAR FEW SQUAMOUS EPITHELIAL CELLS PRESENT RARE GRAM POSITIVE RODS RARE YEAST Performed at [facility]    Culture   Final    NORMAL OROPHARYNGEAL FLORA Note: MICROSCOPIC FINDINGS SUGGEST THAT THIS SPECIMEN IS NOT REPRESENTATIVE OF LOWER RESPIRATORY SECRETIONS. PLEASE RECOLLECT. Performed at [facility]    Report Status 01/01/2014 FINAL  Final  Culture, Urine      Status: None   Collection Time: 12/30/13  6:09 AM  Result Value Ref Range Status   Specimen Description URINE, CLEAN CATCH  Final   Special Requests NONE  Final   Colony Count NO GROWTH Performed at [facility]   Final   Culture NO GROWTH Performed at [facility]   Final   Report Status 12/31/2013 FINAL  Final     Studies: Dg Chest 2 View  01/03/2014   CLINICAL DATA:  Pneumonia, cough.  EXAM: CHEST  2 VIEW  COMPARISON:  12/29/2013  FINDINGS: Cardiomegaly. No edema. Chronic bibasilar opacities likely reflect scarring or atelectasis. No acute opacities or effusions. No acute bony abnormality.  IMPRESSION: Cardiomegaly.  Bibasilar scarring or atelectasis.   Electronically Signed   By: Charlett Nose M.D.   On: 01/03/2014 10:46  Scheduled Meds: . amoxicillin-clavulanate  1 tablet Oral Q12H  . antiseptic oral rinse  7 mL Mouth Rinse BID  . azaTHIOprine  75 mg Oral Daily  . diltiazem  120 mg Oral Q12H  . docusate sodium  100 mg Oral BID  . enoxaparin (LOVENOX) injection  40 mg Subcutaneous Q24H  . furosemide  20 mg Oral BID  . guaiFENesin  600 mg Oral BID  . latanoprost  1 drop Both Eyes QHS  . levalbuterol  0.63 mg Nebulization TID  . magnesium oxide  400 mg Oral BID  . metoprolol tartrate  50 mg Oral BID  . pantoprazole  80 mg Oral Daily  . potassium chloride SA  20 mEq Oral Q M,W,F  . predniSONE  10 mg Oral Q breakfast  . sodium chloride  3 mL Intravenous Q12H  . spironolactone  50 mg Oral Daily  . ursodiol  300 mg Oral BID    Continuous Infusions:    Time spent: 15 minutes  Hollice Espy  [facility] Pager [phone]. If 7PM-7AM, please contact night-coverage at [email or web address], password [facility] 01/04/2014, 11:59 AM  LOS: 6 days

## 2014-01-04 NOTE — Progress Notes (Signed)
PT IS TRANSFERRING TO ROOM 332. REPORT GIVEN TO CRYSTAL RN ON 300.PT ALERT AND ORIENTED. DENIES SOB OR CHEST PAIN. HR 98 IN ATRIAL FLUTTER. LT ARM NSL PATENT.O2 SAT 100 % ON ROOM AIR.FAMILY AT BEDSIDE.

## 2014-01-04 NOTE — Progress Notes (Signed)
Pt heart rate maintaining in the 150's, MD notified & made aware. Pt lying in bed with family at bedside at this time. Pt denies any chest pain or SOB at this time.

## 2014-01-04 NOTE — Progress Notes (Signed)
1133 Pt arrived to 300 unit room# 310 with family at bedside. Telemetry applied to pt by this Clinical research associate, box# 35, central tele called & made aware. SCD pump placed on bed by this Clinical research associate. Pt oriented to room & staff.

## 2014-01-04 NOTE — Progress Notes (Signed)
Pt running ST with heart rate between 130-155. MD notified.

## 2014-01-05 LAB — BASIC METABOLIC PANEL
Anion gap: 7 (ref 5–15)
BUN: 21 mg/dL (ref 6–23)
CO2: 32 mmol/L (ref 19–32)
Calcium: 10.3 mg/dL (ref 8.4–10.5)
Chloride: 99 mEq/L (ref 96–112)
Creatinine, Ser: 1.25 mg/dL — ABNORMAL HIGH (ref 0.50–1.10)
GFR calc Af Amer: 45 mL/min — ABNORMAL LOW (ref >90)
GFR calc non Af Amer: 39 mL/min — ABNORMAL LOW (ref >90)
Glucose, Bld: 89 mg/dL (ref 70–99)
Potassium: 3.2 mmol/L — ABNORMAL LOW (ref 3.5–5.1)
Sodium: 138 mmol/L (ref 135–145)

## 2014-01-05 MED ORDER — METOPROLOL TARTRATE 50 MG PO TABS
75.0000 mg | ORAL_TABLET | Freq: Two times a day (BID) | ORAL | Status: DC
  Administered 2014-01-05 – 2014-01-06 (×2): 75 mg via ORAL
  Filled 2014-01-05 (×4): qty 1

## 2014-01-05 MED ORDER — POTASSIUM CHLORIDE CRYS ER 20 MEQ PO TBCR
40.0000 meq | EXTENDED_RELEASE_TABLET | Freq: Once | ORAL | Status: AC
  Administered 2014-01-05: 40 meq via ORAL
  Filled 2014-01-05: qty 2

## 2014-01-05 NOTE — Progress Notes (Signed)
PROGRESS NOTE  Eileen Santana ZOX:[id number] DOB: June 27, 1930 DOA: 12/29/2013 PCP: [name], MD  HPI/Recap of past 15 hours: 81 year old female with past mental history of COPD, autoimmune disease and chronic kidney disease who was admitted on 12/27 after 1 week of coughing and weakness and found to have a right lower lobe pneumonia plus rapid atrial fibrillation. We'll patient is improved from a pneumonia standpoint, she continues to have episodes of atrial flutter with transient heart rates as high as in the 150s. Cardizem and metoprolol have been used in cardiology has been consulted although heart rates have been difficult to control given soft blood pressures.  With nebulizers and mild diuresis, patient improved to the point where heart rate was staying down more consistently and she was able to be transferred to the floor.  Seen today and feeling better. Heart rate at times goes up slightly at rest. Patient herself has no complaints, no shortness of breath.  Assessment/Plan: Principal Problem:   Atrial flutter with rapid ventricular response: Soft blood pressures persisting. Have added nebulizers for better oxygenation in hopes to decrease heart rate. BNP checked and found to be at 384. Continuing Cardizem and titrating up metoprolol. Active Problems:   Other pancytopenia   Autoimmune hepatitis: Stable    Thrombocytopenia: Last platelet count on 12/30 stable at 159    Anemia: Stable, from chronic disease. Hemoglobin ranging between 9-10    Healthcare-associated pneumonia: Antibiotics changed over to by mouth, tolerating well. Repeat chest x-ray done 1/1 shows significant improvement. Completed antibiotic course 1/2    COPD (chronic obstructive pulmonary disease): Looks to be at baseline  Code Status: Full code  Family Communication: Plan discussed with family at the bedside  Disposition Plan: Home once heart rate staying well controlled, awaiting PT now although patient has  been able to ambulate to the bathroom   Consultants:  Cardiology  Procedures:  None  Antibiotics:  Zosyn 12/27-12/31  Vancomycin 12/27-12/31  Augmentin 12/31-1/2   Objective: BP 109/53 mmHg  Pulse 106  Temp(Src) 98.2 F (36.8 C) (Oral)  Resp 18  Ht 5\' 6"  (1.676 m)  Wt 64.411 kg (142 lb)  BMI 22.93 kg/m2  SpO2 98% No intake or output data in the 24 hours ending 01/05/14 1431 Filed Weights   01/03/14 0500 01/04/14 0500 01/05/14 0700  Weight: 67.7 kg (149 lb 4 oz) 68.992 kg (152 lb 1.6 oz) 64.411 kg (142 lb)    Exam:   General:  Alert and oriented 3, no acute distress  Cardiovascular: Irregular rhythm, borderline tachycardia  Respiratory: Decreased breath sounds throughout  Abdomen: Soft, nontender, nondistended, positive bowel sounds  Musculoskeletal: No clubbing or cyanosis, trace edema   Data Reviewed: Basic Metabolic Panel:  Recent Labs Lab 12/30/13 0523 01/01/14 0505 01/03/14 0516 01/05/14 0550  NA 134* 136 136 138  K 4.2 3.7 3.7 3.2*  CL 103 101 99 99  CO2 26 28 30  32  GLUCOSE 127* 96 105* 89  BUN 22 17 20 21   CREATININE 1.17* 1.15* 1.19* 1.25*  CALCIUM 9.9 9.6 9.7 10.3   Liver Function Tests: No results for input(s): AST, ALT, ALKPHOS, BILITOT, PROT, ALBUMIN in the last 168 hours. No results for input(s): LIPASE, AMYLASE in the last 168 hours. No results for input(s): AMMONIA in the last 168 hours. CBC:  Recent Labs Lab 12/30/13 0523 01/01/14 0505  WBC 8.0 5.7  HGB 9.4* 10.0*  HCT 28.2* 31.1*  MCV 81.5 82.1  PLT 149* 159   Cardiac Enzymes:  No results for input(s): CKTOTAL, CKMB, CKMBINDEX, TROPONINI in the last 168 hours. BNP (last 3 results)  Recent Labs  06/11/13 1339  PROBNP 1496.0*   CBG: No results for input(s): GLUCAP in the last 168 hours.  Recent Results (from the past 240 hour(s))  MRSA PCR Screening     Status: None   Collection Time: 12/29/13  4:40 PM  Result Value Ref Range Status   MRSA by PCR  NEGATIVE NEGATIVE Final    Comment:        The GeneXpert MRSA Assay (FDA approved for NASAL specimens only), is one component of a comprehensive MRSA colonization surveillance program. It is not intended to diagnose MRSA infection nor to guide or monitor treatment for MRSA infections.   Culture, blood (routine x 2) Call MD if unable to obtain prior to antibiotics being given     Status: None   Collection Time: 12/29/13  4:51 PM  Result Value Ref Range Status   Specimen Description BLOOD RIGHT HAND  Final   Special Requests BOTTLES DRAWN AEROBIC AND ANAEROBIC 8CC  Final   Culture NO GROWTH 6 DAYS  Final   Report Status 01/04/2014 FINAL  Final  Culture, blood (routine x 2) Call MD if unable to obtain prior to antibiotics being given     Status: None   Collection Time: 12/29/13  4:52 PM  Result Value Ref Range Status   Specimen Description BLOOD LEFT ANTECUBITAL  Final   Special Requests BOTTLES DRAWN AEROBIC AND ANAEROBIC 10CC  Final   Culture NO GROWTH 6 DAYS  Final   Report Status 01/04/2014 FINAL  Final  Culture, respiratory (NON-Expectorated)     Status: None   Collection Time: 12/30/13  2:24 AM  Result Value Ref Range Status   Specimen Description SPUTUM  Final   Special Requests NONE  Final   Gram Stain   Final    RARE WBC PRESENT, PREDOMINANTLY MONONUCLEAR FEW SQUAMOUS EPITHELIAL CELLS PRESENT RARE GRAM POSITIVE RODS RARE YEAST Performed at [facility]    Culture   Final    NORMAL OROPHARYNGEAL FLORA Note: MICROSCOPIC FINDINGS SUGGEST THAT THIS SPECIMEN IS NOT REPRESENTATIVE OF LOWER RESPIRATORY SECRETIONS. PLEASE RECOLLECT. Performed at [facility]    Report Status 01/01/2014 FINAL  Final  Culture, Urine     Status: None   Collection Time: 12/30/13  6:09 AM  Result Value Ref Range Status   Specimen Description URINE, CLEAN CATCH  Final   Special Requests NONE  Final   Colony Count NO GROWTH Performed at [facility]   Final   Culture  NO GROWTH Performed at [facility]   Final   Report Status 12/31/2013 FINAL  Final     Studies: No results found.  Scheduled Meds: . amoxicillin-clavulanate  1 tablet Oral Q12H  . antiseptic oral rinse  7 mL Mouth Rinse BID  . azaTHIOprine  75 mg Oral Daily  . diltiazem  360 mg Oral Daily  . docusate sodium  100 mg Oral BID  . enoxaparin (LOVENOX) injection  40 mg Subcutaneous Q24H  . furosemide  20 mg Oral BID  . guaiFENesin  600 mg Oral BID  . latanoprost  1 drop Both Eyes QHS  . magnesium oxide  400 mg Oral BID  . metoprolol tartrate  75 mg Oral BID  . pantoprazole  80 mg Oral Daily  . potassium chloride SA  20 mEq Oral Q M,W,F  . potassium chloride  40 mEq Oral  Once  . predniSONE  10 mg Oral Q breakfast  . sodium chloride  3 mL Intravenous Q12H  . spironolactone  50 mg Oral Daily  . ursodiol  300 mg Oral BID    Continuous Infusions:    Time spent: 15 minutes  Hollice Espy  [facility] Pager [phone]. If 7PM-7AM, please contact night-coverage at [email or web address], password [facility] 01/05/2014, 2:31 PM  LOS: 7 days

## 2014-01-06 LAB — BASIC METABOLIC PANEL
Anion gap: 5 (ref 5–15)
BUN: 22 mg/dL (ref 6–23)
CO2: 33 mmol/L — ABNORMAL HIGH (ref 19–32)
Calcium: 10.4 mg/dL (ref 8.4–10.5)
Chloride: 97 mEq/L (ref 96–112)
Creatinine, Ser: 1.28 mg/dL — ABNORMAL HIGH (ref 0.50–1.10)
GFR calc Af Amer: 44 mL/min — ABNORMAL LOW (ref >90)
GFR calc non Af Amer: 38 mL/min — ABNORMAL LOW (ref >90)
Glucose, Bld: 99 mg/dL (ref 70–99)
POTASSIUM: 3.7 mmol/L (ref 3.5–5.1)
SODIUM: 135 mmol/L (ref 135–145)

## 2014-01-06 MED ORDER — DILTIAZEM HCL ER COATED BEADS 360 MG PO CP24: 360.0000 mg | ORAL_CAPSULE | Freq: Every day | ORAL | Status: DC

## 2014-01-06 MED ORDER — METOPROLOL TARTRATE 50 MG PO TABS: 75.0000 mg | ORAL_TABLET | Freq: Two times a day (BID) | ORAL | Status: DC

## 2014-01-06 MED ORDER — DILTIAZEM HCL ER BEADS 360 MG PO CP24: 360.0000 mg | ORAL_CAPSULE | Freq: Every day | ORAL | Status: DC

## 2014-01-06 MED ORDER — DILTIAZEM HCL ER BEADS 360 MG PO CP24: 360.0000 mg | ORAL_CAPSULE | Freq: Every day | ORAL | Status: AC

## 2014-01-06 NOTE — Progress Notes (Signed)
Discharge instructions reviewed with patient and family.  Opportunity to ask questions and questions answered.  IV and telemetry removed.  Pt will be taken down to front lobby with belongings by NT.

## 2014-01-06 NOTE — Evaluation (Signed)
Physical Therapy Evaluation Patient Details Name: Eileen Santana MRN: [id number] DOB: 07/25/1930 Today's Date: 01/06/2014   History of Present Illness  81 year old woman with complicated past medical history as below presented with several day to one week history of cough, fatigue, generalized weakness. In the emergency department found to have atrial fibrillation with rapid ventricular response as well as right lower lobe pneumonia.  Clinical Impression  Pt states she has not been up in six days but appears to be at her prior level of functioning.  Pt HR max was 138 when ambulating.       No PT follow up    Equipment Recommendations  None recommended by PT       Precautions / Restrictions Precautions Precautions: None Restrictions Weight Bearing Restrictions: No      Mobility  Bed Mobility Overal bed mobility: Modified Independent                Transfers Overall transfer level: Modified independent                  Ambulation/Gait Ambulation/Gait assistance: Modified independent (Device/Increase time) Ambulation Distance (Feet): 300 Feet Assistive device: Straight cane Gait Pattern/deviations: WFL(Within Functional Limits)   Gait velocity interpretation: at or above normal speed for age/gender                Pertinent Vitals/Pain Pain Assessment: No/denies pain    Home Living Family/patient expects to be discharged to:: Private residence Living Arrangements: Children Available Help at Discharge: Family Type of Home: House Home Access: Stairs to enter Entrance Stairs-Rails: Right Entrance Stairs-Number of Steps: 3 Home Layout: Able to live on main level with bedroom/bathroom;Two level Home Equipment: Walker - 2 wheels;Cane - quad;Cane - single point      Prior Function Level of Independence: Independent with assistive device(s)               Hand Dominance   Dominant Hand: Right    Extremity/Trunk Assessment                Lower Extremity Assessment: Overall WFL for tasks assessed         Communication   Communication: No difficulties  Cognition Arousal/Alertness: Awake/alert Behavior During Therapy: WFL for tasks assessed/performed Overall Cognitive Status: Within Functional Limits for tasks assessed                               Assessment/Plan    PT Assessment Patent does not need any further PT services  PT Diagnosis     PT Problem List    PT Treatment Interventions     PT Goals (Current goals can be found in the Care Plan section) Acute Rehab PT Goals PT Goal Formulation: All assessment and education complete, DC therapy               End of Session Equipment Utilized During Treatment: Gait belt Activity Tolerance: Patient tolerated treatment well Patient left: in chair;with call bell/phone within reach;with family/visitor present           Time: 2637-8588 PT Time Calculation (min) (ACUTE ONLY): 18 min   Charges:   PT Evaluation $Initial PT Evaluation Tier I: 1 Procedure           RUSSELL,CINDY 01/06/2014, 10:25 AM

## 2014-01-06 NOTE — Discharge Summary (Signed)
Physician Discharge Summary  Eileen Santana MVH:[id number] DOB: 14-Apr-1930 DOA: 12/29/2013  PCP: [name], MD  Admit date: 12/29/2013 Discharge date: 01/06/2014  Time spent: 25 minutes  Recommendations for Outpatient Follow-up:  1. New Medication: Metoprolol 75 g by mouth twice a day 2. Medication change: Cardizem changed to 360 mg extended release every 24 hours 3. Pt will follow up with cardiology in the next 2-3 weeks  Discharge Diagnoses:  Principal Problem:   Atrial flutter with rapid ventricular response Active Problems:   Other pancytopenia   Autoimmune hepatitis   Thrombocytopenia   Anemia   Healthcare-associated pneumonia   COPD (chronic obstructive pulmonary disease)   Discharge Condition: Improved, being discharged to home  Diet recommendation: Heart healthy  Filed Weights   01/04/14 0500 01/05/14 0700 01/06/14 0642  Weight: 68.992 kg (152 lb 1.6 oz) 64.411 kg (142 lb) 70.4 kg (155 lb 3.3 oz)    History of present illness:  81 year old female with past mental history of COPD, autoimmune disease and chronic kidney disease who was admitted on 12/27 after 1 week of coughing and weakness and found to have a right lower lobe pneumonia plus rapid atrial fibrillation. We'll patient is improved from a pneumonia standpoint, she continues to have episodes of atrial flutter with transient heart rates as high as in the 150s. Cardizem and metoprolol have been used in cardiology has been consulted although heart rates have been difficult to control given soft blood pressures. With nebulizers and mild diuresis, patient improved to the point where heart rate was staying down more consistently and she was able to be transferred to the floor.  Hospital Course:  Principal Problem:   Atrial flutter with rapid ventricular response: Patient improved quickly from a pneumonia standpoint, but she continued to have persistent episodes of atrial flutter with heart rates as high as the  150s. Cardiology consulted and patient required doses of Cardizem and metoprolol, although soft blood pressures limited this initially. With use of nebulizers, incentive spirometry and mild diuresis, patient's heart rate started to respond more so and by day of discharge, patient was ambulating in the hallway with a resting heart rate in the 70s-80s and with ambulation, 100s-110s. Medications at time of discharge included titrating up her Cardizem to 360 mg daily and adding metoprolol 75 by mouth twice a day. Patient will follow-up with cardiology in the next few weeks Active Problems:    Autoimmune hepatitis: Stable during this hospitalization    Thrombocytopenia: Initially lower, stabilized on own   Anemia: Stable from chronic disease: Hemoglobin ranging between 9-10   Healthcare-associated pneumonia: Pt initially started on broad spectrum antibiotics & then changed over to po.  A repeat CXR done 1/1 noted significant improvement. She finished the full abx course on 1/2.    COPD (chronic obstructive pulmonary disease)  Procedures:  None  Consultations:  Cardiology  Discharge Exam: Filed Vitals:   01/06/14 1453  BP: 105/56  Pulse: 86  Temp: 98.8 F (37.1 C)  Resp: 18    General: A&O x 3 Cardiovascular: RRR, S1, S2 Respiratory: CTA Bilaterally  Discharge Instructions   Discharge Instructions    Diet - low sodium heart healthy    Complete by:  As directed      Increase activity slowly    Complete by:  As directed           Current Discharge Medication List    START taking these medications   Details  diltiazem (TIAZAC) 360 MG 24 hr capsule Take  1 capsule (360 mg total) by mouth daily. Qty: 30 capsule, Refills: 1      CONTINUE these medications which have CHANGED   Details  metoprolol (LOPRESSOR) 50 MG tablet Take 1.5 tablets (75 mg total) by mouth 2 (two) times daily. Qty: 90 tablet, Refills: 2      CONTINUE these medications which have NOT CHANGED   Details   acetaminophen (TYLENOL) 325 MG tablet Take 650 mg by mouth every 6 (six) hours as needed. For pain    azaTHIOprine (IMURAN) 50 MG tablet TAKE 1 AND 1/2 TABLETS BY MOUTH ONCE DAILY Qty: 60 tablet, Refills: 5    Cholecalciferol (VITAMIN D) 2000 UNITS tablet Take 2,000 Units by mouth daily.    Coenzyme Q10 200 MG capsule Take 200 mg by mouth daily.    Darbepoetin Alfa (ARANESP) 500 MCG/ML SOSY injection Inject 500 mcg into the skin every 21 ( twenty-one) days.    DEXILANT 60 MG capsule TAKE 1 CAPSULE BY MOUTH EVERY OTHER DAY Qty: 45 capsule, Refills: 4    diclofenac sodium (VOLTAREN) 1 % GEL Apply 1 application topically daily as needed (for pain). Apply to back and legs    fish oil-omega-3 fatty acids 1000 MG capsule Take 1 g by mouth daily.   Associated Diagnoses: Pancytopenia; Elevated liver enzymes    folic acid (FOLVITE) 800 MCG tablet Take 800 mcg by mouth daily.    Associated Diagnoses: Pancytopenia    furosemide (LASIX) 20 MG tablet Take 20 mg by mouth daily as needed. For fluid retention   Associated Diagnoses: Anemia; Elevated liver enzymes    Magnesium 250 MG TABS Take 1 tablet by mouth 2 (two) times daily.    Multiple Vitamins-Minerals (MULTIVITAMIN WITH MINERALS) tablet Take 1 tablet by mouth daily.     Associated Diagnoses: Pancytopenia    potassium chloride SA (K-DUR,KLOR-CON) 20 MEQ tablet TAKE 1 TABLET BY MOUTH ONCE DAILY Qty: 30 tablet, Refills: 1    predniSONE (DELTASONE) 10 MG tablet Take 1 tablet (10 mg total) by mouth daily with breakfast. Qty: 90 tablet, Refills: 3    sodium chloride 0.9 % SOLN 100 mL with ferumoxytol 510 MG/17ML SOLN 1,020 mg Inject 510 mg into the vein as needed (IF HEMOGLOBIN IS LESS THAN 10.).     spironolactone (ALDACTONE) 50 MG tablet TAKE 1 TABLET BY MOUTH DAILY Qty: 90 tablet, Refills: 1    Travoprost, BAK Free, (TRAVATAN) 0.004 % SOLN ophthalmic solution Place 1 drop into both eyes at bedtime.    vitamin B-12 (CYANOCOBALAMIN)  1000 MCG tablet Take 1,000 mcg by mouth daily.    ursodiol (ACTIGALL) 250 MG tablet TAKE 2 TABLETS BY MOUTH TWICE DAILY Qty: 120 tablet, Refills: 4      STOP taking these medications     diltiazem (CARDIZEM CD) 120 MG 24 hr capsule      ursodiol (ACTIGALL) 300 MG capsule        Allergies  Allergen Reactions  . Iohexol Swelling    IV Dye   . Ivp Dye [Iodinated Diagnostic Agents] Swelling    Hives  . Naprosyn [Naproxen] Hives and Itching  . Red Blood Cells Swelling and Dermatitis    With blood transfusion 2012  . Verapamil     Heart block (2nd degree) Takes Cardizem   . Vicodin [Hydrocodone-Acetaminophen] Hives and Itching  . Sulfa Antibiotics Itching and Rash      The results of significant diagnostics from this hospitalization (including imaging, microbiology, ancillary and laboratory) are listed below for  reference.    Significant Diagnostic Studies: Dg Chest 2 View  01/03/2014   CLINICAL DATA:  Pneumonia, cough.  EXAM: CHEST  2 VIEW  COMPARISON:  12/29/2013  FINDINGS: Cardiomegaly. No edema. Chronic bibasilar opacities likely reflect scarring or atelectasis. No acute opacities or effusions. No acute bony abnormality.  IMPRESSION: Cardiomegaly.  Bibasilar scarring or atelectasis.   Electronically Signed   By: Charlett Nose M.D.   On: 01/03/2014 10:46   Dg Chest 2 View  12/29/2013   CLINICAL DATA:  Cough, history hypertension  EXAM: CHEST  2 VIEW  COMPARISON:  10/20/2013  FINDINGS: Enlargement of cardiac silhouette.  Mediastinal contours and pulmonary vascularity normal.  Atherosclerotic calcification aorta.  Emphysematous and minimal bronchitic changes with RIGHT lower lobe consolidation consistent with pneumonia.  No pleural effusion or pneumothorax.  Bones demineralized.  IMPRESSION: COPD changes with RIGHT lower lobe pneumonia.  Chronic enlargement of cardiac silhouette.   Electronically Signed   By: Ulyses Southward M.D.   On: 12/29/2013 11:22    Microbiology: Recent  Results (from the past 240 hour(s))  MRSA PCR Screening     Status: None   Collection Time: 12/29/13  4:40 PM  Result Value Ref Range Status   MRSA by PCR NEGATIVE NEGATIVE Final    Comment:        The GeneXpert MRSA Assay (FDA approved for NASAL specimens only), is one component of a comprehensive MRSA colonization surveillance program. It is not intended to diagnose MRSA infection nor to guide or monitor treatment for MRSA infections.   Culture, blood (routine x 2) Call MD if unable to obtain prior to antibiotics being given     Status: None   Collection Time: 12/29/13  4:51 PM  Result Value Ref Range Status   Specimen Description BLOOD RIGHT HAND  Final   Special Requests BOTTLES DRAWN AEROBIC AND ANAEROBIC 8CC  Final   Culture NO GROWTH 6 DAYS  Final   Report Status 01/04/2014 FINAL  Final  Culture, blood (routine x 2) Call MD if unable to obtain prior to antibiotics being given     Status: None   Collection Time: 12/29/13  4:52 PM  Result Value Ref Range Status   Specimen Description BLOOD LEFT ANTECUBITAL  Final   Special Requests BOTTLES DRAWN AEROBIC AND ANAEROBIC 10CC  Final   Culture NO GROWTH 6 DAYS  Final   Report Status 01/04/2014 FINAL  Final  Culture, respiratory (NON-Expectorated)     Status: None   Collection Time: 12/30/13  2:24 AM  Result Value Ref Range Status   Specimen Description SPUTUM  Final   Special Requests NONE  Final   Gram Stain   Final    RARE WBC PRESENT, PREDOMINANTLY MONONUCLEAR FEW SQUAMOUS EPITHELIAL CELLS PRESENT RARE GRAM POSITIVE RODS RARE YEAST Performed at [facility]    Culture   Final    NORMAL OROPHARYNGEAL FLORA Note: MICROSCOPIC FINDINGS SUGGEST THAT THIS SPECIMEN IS NOT REPRESENTATIVE OF LOWER RESPIRATORY SECRETIONS. PLEASE RECOLLECT. Performed at [facility]    Report Status 01/01/2014 FINAL  Final  Culture, Urine     Status: None   Collection Time: 12/30/13  6:09 AM  Result Value Ref Range Status    Specimen Description URINE, CLEAN CATCH  Final   Special Requests NONE  Final   Colony Count NO GROWTH Performed at [facility]   Final   Culture NO GROWTH Performed at [facility]   Final   Report Status 12/31/2013  FINAL  Final     Labs: Basic Metabolic Panel:  Recent Labs Lab 01/01/14 0505 01/03/14 0516 01/05/14 0550 01/06/14 0603  NA 136 136 138 135  K 3.7 3.7 3.2* 3.7  CL 101 99 99 97  CO2 28 30 32 33*  GLUCOSE 96 105* 89 99  BUN 17 20 21 22   CREATININE 1.15* 1.19* 1.25* 1.28*  CALCIUM 9.6 9.7 10.3 10.4   CBC:  Recent Labs Lab 01/01/14 0505  WBC 5.7  HGB 10.0*  HCT 31.1*  MCV 82.1  PLT 159       Signed:  Tyshauna Finkbiner K  [facility] 01/06/2014, 3:09 PM

## 2014-01-06 NOTE — Progress Notes (Signed)
Patient ID: Eileen Santana, female   DOB: [birth year], 81 y.o.   MRN: [id number]    Primary cardiologist:  Subjective:      Objective:   Temp:  [98.2 F (36.8 C)-98.8 F (37.1 C)] 98.8 F (37.1 C) (01/04 0642) Pulse Rate:  [83-106] 83 (01/04 0642) Resp:  [18] 18 (01/04 0642) BP: (94-109)/(53-61) 94/61 mmHg (01/04 0642) SpO2:  [97 %-98 %] 97 % (01/04 0642) Weight:  [155 lb 3.3 oz (70.4 kg)] 155 lb 3.3 oz (70.4 kg) (01/04 0642) Last BM Date: 01/05/14  Filed Weights   01/04/14 0500 01/05/14 0700 01/06/14 0642  Weight: 152 lb 1.6 oz (68.992 kg) 142 lb (64.411 kg) 155 lb 3.3 oz (70.4 kg)   No intake or output data in the 24 hours ending 01/06/14 0907  Telemetry: aflutter 80s-90s  Exam:  General:  HEENT:  Resp:  Cardiac:  GI:  MSK:  Neuro:   Psych  Lab Results:  Basic Metabolic Panel:  Recent Labs Lab 01/03/14 0516 01/05/14 0550 01/06/14 0603  NA 136 138 135  K 3.7 3.2* 3.7  CL 99 99 97  CO2 30 32 33*  GLUCOSE 105* 89 99  BUN 20 21 22   CREATININE 1.19* 1.25* 1.28*  CALCIUM 9.7 10.3 10.4    Liver Function Tests: No results for input(s): AST, ALT, ALKPHOS, BILITOT, PROT, ALBUMIN in the last 168 hours.  CBC:  Recent Labs Lab 01/01/14 0505  WBC 5.7  HGB 10.0*  HCT 31.1*  MCV 82.1  PLT 159    Cardiac Enzymes: No results for input(s): CKTOTAL, CKMB, CKMBINDEX, TROPONINI in the last 168 hours.  BNP:  Recent Labs  06/11/13 1339  PROBNP 1496.0*    Coagulation: No results for input(s): INR in the last 168 hours.  ECG:   Medications:   Scheduled Medications: . amoxicillin-clavulanate  1 tablet Oral Q12H  . antiseptic oral rinse  7 mL Mouth Rinse BID  . azaTHIOprine  75 mg Oral Daily  . diltiazem  360 mg Oral Daily  . docusate sodium  100 mg Oral BID  . enoxaparin (LOVENOX) injection  40 mg Subcutaneous Q24H  . furosemide  20 mg Oral BID  . guaiFENesin  600 mg Oral BID  . latanoprost  1 drop Both Eyes QHS  . magnesium oxide  400  mg Oral BID  . metoprolol tartrate  75 mg Oral BID  . pantoprazole  80 mg Oral Daily  . potassium chloride SA  20 mEq Oral Q M,W,F  . predniSONE  10 mg Oral Q breakfast  . sodium chloride  3 mL Intravenous Q12H  . spironolactone  50 mg Oral Daily  . ursodiol  300 mg Oral BID     Infusions:     PRN Medications:  sodium chloride, benzonatate, levalbuterol, ondansetron **OR** ondansetron (ZOFRAN) IV, sodium chloride     Assessment/Plan   1. Aflutter with RVR - from prior cardiology notes not deemed a candidate for anticoagulation - on rate control with dilt and lopressor, rates 80-90s on telemetry. Goal rate control < 110. Has some soft blood pressures at times but overall stable systolics in 90s to low 100s. She denies any lightheadness or dizziness -continue current meds.   2. HCAP - abx per primary team      08/11/13, M.D.

## 2014-01-07 NOTE — Care Management Utilization Note (Signed)
UR completed 

## 2014-01-09 ENCOUNTER — Encounter: Payer: Self-pay | Admitting: Cardiovascular Disease

## 2014-01-09 ENCOUNTER — Ambulatory Visit (INDEPENDENT_AMBULATORY_CARE_PROVIDER_SITE_OTHER): Payer: Medicare Other | Admitting: Cardiovascular Disease

## 2014-01-09 VITALS — BP 122/72 | HR 101 | Ht 66.0 in | Wt 151.0 lb

## 2014-01-09 DIAGNOSIS — I10 Essential (primary) hypertension: Secondary | ICD-10-CM

## 2014-01-09 DIAGNOSIS — Z9289 Personal history of other medical treatment: Secondary | ICD-10-CM

## 2014-01-09 DIAGNOSIS — D509 Iron deficiency anemia, unspecified: Secondary | ICD-10-CM

## 2014-01-09 DIAGNOSIS — I422 Other hypertrophic cardiomyopathy: Secondary | ICD-10-CM

## 2014-01-09 DIAGNOSIS — I4891 Unspecified atrial fibrillation: Secondary | ICD-10-CM

## 2014-01-09 DIAGNOSIS — Z87898 Personal history of other specified conditions: Secondary | ICD-10-CM

## 2014-01-09 NOTE — Patient Instructions (Signed)
Your physician wants you to follow-up in: 4 months with Dr. Koneswaran. You will receive a reminder letter in the mail two months in advance. If you don't receive a letter, please call our office to schedule the follow-up appointment.  Your physician recommends that you continue on your current medications as directed. Please refer to the Current Medication list given to you today.  Thank you for choosing [facility]!!    

## 2014-01-09 NOTE — Progress Notes (Signed)
Patient ID: Eileen Santana, female   DOB: [birth year], 81 y.o.   MRN: [id number]      SUBJECTIVE: The patient presents for post hospital follow-up. She was discharged 3 days ago after being treated for right lower lobe pneumonia and rapid atrial flutter. She was started on long-acting diltiazem 360 mg daily and metoprolol 75 mg twice daily. She is not deemed to be a suitable candidate for anticoagulation due to recurrent GI bleeds and transfusion-dependent anemia. She also has a history of autoimmune hepatitis. Echocardiogram from 2013 demonstrated severe concentric left ventricular hypertrophy suggestive of either an infiltrative cardiomyopathy versus advanced hypertensive heart disease. LVEF estimated at 65-70% with grade 1 diastolic dysfunction and elevated left ventricular filling pressures.  She is feeling well today and is without complaints. Specifically she denies chest pain, shortness of breath, leg swelling, dizziness, and palpitations. Her daughter Olegario Messier who is with her is a former Wellsite geologist of an ICU and currently teaches nursing. She is very attentive to her mother's health. Her heart rate has been ranging in the 70-80 beat per minute range with systolic blood pressures in the 120-130 mmHg range. The patient had a brother and sister who passed away in the last 4 months. Her previous anemia was reportedly due to chronic NSAID use. She has not had an upper endoscopy in quite some time and sees GI (Dr. Karilyn Cota) for autoimmune hepatitis. Her daughter has questions regarding anticoagulation consideration. The patient also developed a delayed reaction to a blood transfusion in the past and her daughter showed me pictures of this.   Review of Systems: As per "subjective", otherwise negative.  Allergies  Allergen Reactions  . Iohexol Swelling    IV Dye   . Ivp Dye [Iodinated Diagnostic Agents] Swelling    Hives  . Naprosyn [Naproxen] Hives and Itching  . Red Blood Cells Swelling and  Dermatitis    With blood transfusion 2012  . Verapamil     Heart block (2nd degree) Takes Cardizem   . Vicodin [Hydrocodone-Acetaminophen] Hives and Itching  . Sulfa Antibiotics Itching and Rash    Current Outpatient Prescriptions  Medication Sig Dispense Refill  . acetaminophen (TYLENOL) 325 MG tablet Take 650 mg by mouth every 6 (six) hours as needed. For pain    . azaTHIOprine (IMURAN) 50 MG tablet TAKE 1 AND 1/2 TABLETS BY MOUTH ONCE DAILY 60 tablet 5  . Cholecalciferol (VITAMIN D) 2000 UNITS tablet Take 2,000 Units by mouth daily.    . Coenzyme Q10 200 MG capsule Take 200 mg by mouth daily.    . Darbepoetin Alfa (ARANESP) 500 MCG/ML SOSY injection Inject 500 mcg into the skin every 21 ( twenty-one) days.    Marland Kitchen DEXILANT 60 MG capsule TAKE 1 CAPSULE BY MOUTH EVERY OTHER DAY 45 capsule 4  . diclofenac sodium (VOLTAREN) 1 % GEL Apply 1 application topically daily as needed (for pain). Apply to back and legs    . diltiazem (TIAZAC) 360 MG 24 hr capsule Take 1 capsule (360 mg total) by mouth daily. 30 capsule 1  . fish oil-omega-3 fatty acids 1000 MG capsule Take 1 g by mouth daily.    . folic acid (FOLVITE) 800 MCG tablet Take 800 mcg by mouth daily.     . furosemide (LASIX) 20 MG tablet Take 20 mg by mouth daily as needed. For fluid retention    . Magnesium 250 MG TABS Take 1 tablet by mouth 2 (two) times daily.    . metoprolol (LOPRESSOR) 50 MG  tablet Take 1.5 tablets (75 mg total) by mouth 2 (two) times daily. 90 tablet 2  . Multiple Vitamins-Minerals (MULTIVITAMIN WITH MINERALS) tablet Take 1 tablet by mouth daily.      . potassium chloride SA (K-DUR,KLOR-CON) 20 MEQ tablet TAKE 1 TABLET BY MOUTH ONCE DAILY 30 tablet 1  . predniSONE (DELTASONE) 10 MG tablet Take 1 tablet (10 mg total) by mouth daily with breakfast. 90 tablet 3  . sodium chloride 0.9 % SOLN 100 mL with ferumoxytol 510 MG/17ML SOLN 1,020 mg Inject 510 mg into the vein as needed (IF HEMOGLOBIN IS LESS THAN 10.).     Marland Kitchen  spironolactone (ALDACTONE) 50 MG tablet TAKE 1 TABLET BY MOUTH DAILY 90 tablet 1  . Travoprost, BAK Free, (TRAVATAN) 0.004 % SOLN ophthalmic solution Place 1 drop into both eyes at bedtime.    . ursodiol (ACTIGALL) 250 MG tablet TAKE 2 TABLETS BY MOUTH TWICE DAILY (Patient not taking: Reported on 12/29/2013) 120 tablet 4  . vitamin B-12 (CYANOCOBALAMIN) 1000 MCG tablet Take 1,000 mcg by mouth daily.     No current facility-administered medications for this visit.   Facility-Administered Medications Ordered in Other Visits  Medication Dose Route Frequency Provider Last Rate Last Dose  . 0.9 %  sodium chloride infusion   Intravenous Continuous Ellouise Newer, PA-C   Stopped at 10/04/13 1230  . diphenhydrAMINE (BENADRYL) capsule 25 mg  25 mg Oral Q6H PRN Randall An, MD   25 mg at 05/13/11 8119    Past Medical History  Diagnosis Date  . History of GI bleed     Associated with NSAIDS  . Iron deficiency anemia     Transfusion dependent  . DJD (degenerative joint disease)   . Essential hypertension, benign   . History of colitis   . Ileitis   . Pancytopenia   . Autoimmune hepatitis     With leukocytoclastic vasculitis  . Heart block AV second degree March 2013    a. Cardiology consult note 06/2013: "Question of previous second degree heart block, although review of cardiology notes indicates that this may have been actually blocked PACs when the patient was on verapamil."  . Bronchitis   . Steroid-induced myopathy   . Renal calculus 08/12/2011  . Rheumatoid arthritis(714.0)   . Colon ulcer 04/2010    NSAID related  . Gastric ulcer 04/2010    NSAID related  . Cancer   . UGI bleed 09/20/2011    Focal area of gastritis oozing of blood  . Gastric AVM 09/20/2011  . Candida esophagitis 09/20/2011  . Paroxysmal atrial fibrillation     Not on anticoag 2/2 hx of GIB/AVM  . Pericarditis     2D Echo EF 65%-70%  . CKD (chronic kidney disease), stage III   . Cholestatic hepatitis   . UTI  (lower urinary tract infection)     history  . Paroxysmal atrial flutter     Not on anticoag 2/2 hx of GIB/AVM - dx 06/2013.  . Multifocal atrial tachycardia   . Hypertrophic cardiomyopathy     Echo 03/2011: severe LVH suggesting possble infiltrate cardiomyopathy or advanced hypertensive heart disease, increased  echogenicity of the ventricular septum as well as the pericardium, EF >70% with end systolicmid-cavitary obliteration of the ventricle, stage 1 DD, mild MR, small IVC.  Marland Kitchen Hypomagnesemia     Past Surgical History  Procedure Laterality Date  . Colonoscopy  04/2010  . Upper gastrointestinal endoscopy  04/2010  . Esophagogastroduodenoscopy  09/20/2011  Status post APC.  Marland Kitchen Esophagogastroduodenoscopy  09/20/2011    Procedure: ESOPHAGOGASTRODUODENOSCOPY (EGD);  Surgeon: Malissa Hippo, MD;  Location: [facility];  Service: Endoscopy;  Laterality: N/A;  . Cyst removal hand      Elbow    History   Social History  . Marital Status: Single    Spouse Name: N/A    Number of Children: N/A  . Years of Education: N/A   Occupational History  . Not on file.   Social History Main Topics  . Smoking status: Current Some Day Smoker -- 0.25 packs/day    Types: Cigarettes  . Smokeless tobacco: Never Used  . Alcohol Use: No  . Drug Use: No  . Sexual Activity: Not Currently   Other Topics Concern  . Not on file   Social History Narrative    BP 122/72  Pulse 82 bpm Weight 151 lb (68.493 kg) Height 5\' 6"  (1.676 m)    PHYSICAL EXAM General: NAD HEENT: Normal. Neck: No JVD, no thyromegaly. Lungs: Clear to auscultation bilaterally with normal respiratory effort. CV: Nondisplaced PMI.  Irregular rhythm, HR 82 bpm, normal S1/S2, no S3, no murmur. No pretibial or periankle edema.  No carotid bruit.    Abdomen: Soft,no distention.  Neurologic: Alert and oriented.  Psych: Normal affect. Skin: Stasis dermatitis of the legs. Musculoskeletal: No gross deformities. Extremities: No  clubbing or cyanosis.   ECG: Most recent ECG reviewed.    ASSESSMENT AND PLAN: 1. Atrial flutter/fibrillation with RVR: HR currently well controlled on long-acting diltiazem 360 mg daily and metoprolol 75 mg twice daily. She is currently not a candidate for anticoagulation. However, if she were to have an EGD which did not demonstrate any remarkable abnormalities, one could consider a low-dose of an anticoagulant for CVA prevention. 2. Cardiomyopathy: No evidence of heart failure. Continue spironolactone 50 mg daily along with Lasix as needed. 3. Essential HTN: Well controlled. No changes.  Dispo: f/u 4 months.  Time spent: 40 minutes, of which >50% spent reviewing recent and prior hospitalizations with patient and family.  , M.D., F.A.C.C.

## 2014-01-13 ENCOUNTER — Other Ambulatory Visit (INDEPENDENT_AMBULATORY_CARE_PROVIDER_SITE_OTHER): Payer: Self-pay | Admitting: Internal Medicine

## 2014-01-13 ENCOUNTER — Encounter (HOSPITAL_COMMUNITY): Payer: Medicare Other | Attending: Oncology

## 2014-01-13 ENCOUNTER — Encounter (HOSPITAL_COMMUNITY): Payer: Medicare Other

## 2014-01-13 DIAGNOSIS — D509 Iron deficiency anemia, unspecified: Secondary | ICD-10-CM | POA: Diagnosis present

## 2014-01-13 DIAGNOSIS — D649 Anemia, unspecified: Secondary | ICD-10-CM | POA: Insufficient documentation

## 2014-01-13 DIAGNOSIS — D500 Iron deficiency anemia secondary to blood loss (chronic): Secondary | ICD-10-CM | POA: Insufficient documentation

## 2014-01-13 DIAGNOSIS — N289 Disorder of kidney and ureter, unspecified: Secondary | ICD-10-CM | POA: Insufficient documentation

## 2014-01-13 DIAGNOSIS — D61818 Other pancytopenia: Secondary | ICD-10-CM | POA: Insufficient documentation

## 2014-01-13 LAB — CBC WITH DIFFERENTIAL/PLATELET
BASOS ABS: 0.1 10*3/uL (ref 0.0–0.1)
BASOS PCT: 1 % (ref 0–1)
Eosinophils Absolute: 0.1 10*3/uL (ref 0.0–0.7)
Eosinophils Relative: 2 % (ref 0–5)
HCT: 36.6 % (ref 36.0–46.0)
HEMOGLOBIN: 12.1 g/dL (ref 12.0–15.0)
Lymphocytes Relative: 13 % (ref 12–46)
Lymphs Abs: 1 10*3/uL (ref 0.7–4.0)
MCH: 28.4 pg (ref 26.0–34.0)
MCHC: 33.1 g/dL (ref 30.0–36.0)
MCV: 85.9 fL (ref 78.0–100.0)
Monocytes Absolute: 0.8 10*3/uL (ref 0.1–1.0)
Monocytes Relative: 11 % (ref 3–12)
NEUTROS ABS: 5.7 10*3/uL (ref 1.7–7.7)
Neutrophils Relative %: 74 % (ref 43–77)
Platelets: 139 10*3/uL — ABNORMAL LOW (ref 150–400)
RBC: 4.26 MIL/uL (ref 3.87–5.11)
RDW: 22.6 % — AB (ref 11.5–15.5)
WBC: 7.7 10*3/uL (ref 4.0–10.5)

## 2014-01-13 NOTE — Progress Notes (Signed)
Hemoglobin WNL no injection needed  

## 2014-01-13 NOTE — Progress Notes (Signed)
LABS FOR CBCD 

## 2014-02-03 ENCOUNTER — Encounter (HOSPITAL_BASED_OUTPATIENT_CLINIC_OR_DEPARTMENT_OTHER): Payer: Medicare Other | Admitting: Oncology

## 2014-02-03 ENCOUNTER — Encounter (HOSPITAL_COMMUNITY): Payer: Medicare Other | Attending: Oncology

## 2014-02-03 ENCOUNTER — Encounter (HOSPITAL_COMMUNITY): Payer: Medicare Other

## 2014-02-03 ENCOUNTER — Encounter (HOSPITAL_COMMUNITY): Payer: Self-pay | Admitting: Oncology

## 2014-02-03 ENCOUNTER — Encounter (HOSPITAL_COMMUNITY): Payer: Self-pay

## 2014-02-03 VITALS — BP 109/63 | HR 92 | Temp 98.1°F | Resp 18 | Wt 153.8 lb

## 2014-02-03 DIAGNOSIS — D649 Anemia, unspecified: Secondary | ICD-10-CM

## 2014-02-03 DIAGNOSIS — D61818 Other pancytopenia: Secondary | ICD-10-CM | POA: Diagnosis present

## 2014-02-03 DIAGNOSIS — K922 Gastrointestinal hemorrhage, unspecified: Secondary | ICD-10-CM

## 2014-02-03 DIAGNOSIS — N289 Disorder of kidney and ureter, unspecified: Secondary | ICD-10-CM | POA: Diagnosis present

## 2014-02-03 DIAGNOSIS — D638 Anemia in other chronic diseases classified elsewhere: Secondary | ICD-10-CM

## 2014-02-03 DIAGNOSIS — N183 Chronic kidney disease, stage 3 unspecified: Secondary | ICD-10-CM | POA: Insufficient documentation

## 2014-02-03 DIAGNOSIS — D500 Iron deficiency anemia secondary to blood loss (chronic): Secondary | ICD-10-CM | POA: Insufficient documentation

## 2014-02-03 DIAGNOSIS — D631 Anemia in chronic kidney disease: Secondary | ICD-10-CM

## 2014-02-03 DIAGNOSIS — D509 Iron deficiency anemia, unspecified: Secondary | ICD-10-CM | POA: Insufficient documentation

## 2014-02-03 DIAGNOSIS — M069 Rheumatoid arthritis, unspecified: Secondary | ICD-10-CM

## 2014-02-03 HISTORY — DX: Anemia in chronic kidney disease: D63.1

## 2014-02-03 HISTORY — DX: Chronic kidney disease, stage 3 unspecified: N18.30

## 2014-02-03 LAB — CBC WITH DIFFERENTIAL/PLATELET
BASOS PCT: 0 % (ref 0–1)
Basophils Absolute: 0 10*3/uL (ref 0.0–0.1)
Eosinophils Absolute: 0.2 10*3/uL (ref 0.0–0.7)
Eosinophils Relative: 4 % (ref 0–5)
HEMATOCRIT: 34.7 % — AB (ref 36.0–46.0)
Hemoglobin: 11.5 g/dL — ABNORMAL LOW (ref 12.0–15.0)
LYMPHS ABS: 1 10*3/uL (ref 0.7–4.0)
Lymphocytes Relative: 19 % (ref 12–46)
MCH: 27.8 pg (ref 26.0–34.0)
MCHC: 33.1 g/dL (ref 30.0–36.0)
MCV: 84 fL (ref 78.0–100.0)
MONOS PCT: 8 % (ref 3–12)
Monocytes Absolute: 0.4 10*3/uL (ref 0.1–1.0)
Neutro Abs: 3.7 10*3/uL (ref 1.7–7.7)
Neutrophils Relative %: 68 % (ref 43–77)
Platelets: 76 10*3/uL — ABNORMAL LOW (ref 150–400)
RBC: 4.13 MIL/uL (ref 3.87–5.11)
RDW: 19.8 % — ABNORMAL HIGH (ref 11.5–15.5)
WBC: 5.4 10*3/uL (ref 4.0–10.5)

## 2014-02-03 LAB — FERRITIN: FERRITIN: 81 ng/mL (ref 10–291)

## 2014-02-03 NOTE — Assessment & Plan Note (Addendum)
We will continue to monitor labs and administer IV iron as needed per lab values.  IDA is secondary to chronic GI blood loss.  Labs every 4 weeks: CBC Diff, Ferritin.  Return in 3 months for follow-up.

## 2014-02-03 NOTE — Progress Notes (Signed)
Please see doctors encounter for more information 

## 2014-02-03 NOTE — Progress Notes (Signed)
Labs for cbcd,ferr 

## 2014-02-03 NOTE — Patient Instructions (Signed)
[facility] at [facility]  Discharge Instructions:  Continue with labs every 4 weeks.  Return in 12 weeks for follow-up. _______________________________________________________________  Thank you for choosing [facility] at [facility] to provide your oncology and hematology care.  To afford each patient quality time with our providers, please arrive at least 15 minutes before your scheduled appointment.  You need to re-schedule your appointment if you arrive 10 or more minutes late.  We strive to give you quality time with our providers, and arriving late affects you and other patients whose appointments are after yours.  Also, if you no show three or more times for appointments you may be dismissed from the clinic.  Again, thank you for choosing [facility] at [facility]. Our hope is that these requests will allow you access to exceptional care and in a timely manner. _______________________________________________________________  If you have questions after your visit, please contact our office at [phone] between the hours of 8:30 a.m. and 5:00 p.m. Voicemails left after 4:30 p.m. will not be returned until the following business day. _______________________________________________________________  For prescription refill requests, have your pharmacy contact our office. _______________________________________________________________  Recommendations made by the consultant and any test results will be sent to your referring physician. _______________________________________________________________

## 2014-02-03 NOTE — Assessment & Plan Note (Signed)
Causing anemia, requiring ESA intervention from time to time.  Given recent Hemoglobin values, supportive therapy plan is deleted since ESA dosing was inappropriate.  Will re-institute if needed.  Labs every 4 weeks: CBC diff.   

## 2014-02-03 NOTE — Assessment & Plan Note (Addendum)
Secondary to rheumatoid arthritis.  Labs every 4 weeks: CBC diff   

## 2014-02-03 NOTE — Progress Notes (Signed)
Eileen Courier, MD [address] [place] [address]  Chronic renal disease, stage 3, moderately decreased glomerular filtration rate between 30-59 mL/min/1.73 square meter - Plan: CBC with Differential  Iron deficiency anemia due to chronic blood loss - Plan: Ferritin  Anemia, unspecified anemia type - Plan: CBC with Differential, Ferritin  Gastrointestinal hemorrhage, unspecified gastritis, unspecified gastrointestinal hemorrhage type - Plan: CBC with Differential  Anemia of chronic disease  Anemia of chronic renal failure, stage 3 (moderate)  CURRENT THERAPY: Intermittent IV Ferhame PRN.  ESA supportive therapy plan deleted as it was not renal dosing and we will re-institute this treatment in the future if needed at the appropriate dosing and schedule.   INTERVAL HISTORY: Eileen Santana 81 y.o. female returns for followup of anemia of chronic disease with rheumatoid arthritis, anemia of chronic renal disease with and iron deficiency with blood loss secondary to intestinal AV malformations, chronic kidney disease, hypersplenism and also depletion of iron stores by the use of Aranesp.  I personally reviewed and went over laboratory results with the patient.  The results are noted within this dictation.  Labs are stable and actually on 1/11, her Hgb was WNL with a platelet count that was nearly WNL.  She denies any blood in her stool, black tarry stool, hematuria, hemoptysis.    She notes a chronic back pain which is chronic x years.  I will defer this to her primary care provider.  She otherwise denies any complaints and ROS questioning is negative.  I challenged her to a round of improvised Bocce ball in the waiting room and I won.  We had a good time.  Past Medical History  Diagnosis Date  . History of GI bleed     Associated with NSAIDS  . Iron deficiency anemia     Transfusion dependent  . DJD (degenerative joint disease)   . Essential hypertension, benign     . History of colitis   . Ileitis   . Pancytopenia   . Autoimmune hepatitis     With leukocytoclastic vasculitis  . Heart block AV second degree March 2013    a. Cardiology consult note 06/2013: "Question of previous second degree heart block, although review of cardiology notes indicates that this may have been actually blocked PACs when the patient was on verapamil."  . Bronchitis   . Steroid-induced myopathy   . Renal calculus 08/12/2011  . Rheumatoid arthritis(714.0)   . Colon ulcer 04/2010    NSAID related  . Gastric ulcer 04/2010    NSAID related  . Cancer   . UGI bleed 09/20/2011    Focal area of gastritis oozing of blood  . Gastric AVM 09/20/2011  . Candida esophagitis 09/20/2011  . Paroxysmal atrial fibrillation     Not on anticoag 2/2 hx of GIB/AVM  . Pericarditis     2D Echo EF 65%-70%  . CKD (chronic kidney disease), stage III   . Cholestatic hepatitis   . UTI (lower urinary tract infection)     history  . Paroxysmal atrial flutter     Not on anticoag 2/2 hx of GIB/AVM - dx 06/2013.  . Multifocal atrial tachycardia   . Hypertrophic cardiomyopathy     Echo 03/2011: severe LVH suggesting possble infiltrate cardiomyopathy or advanced hypertensive heart disease, increased  echogenicity of the ventricular septum as well as the pericardium, EF >70% with end systolicmid-cavitary obliteration of the ventricle, stage 1 DD, mild MR, small IVC.  Marland Kitchen  Hypomagnesemia   . Chronic renal disease, stage 3, moderately decreased glomerular filtration rate between 30-59 mL/min/1.73 square meter 02/03/2014  . Anemia of chronic disease 11/25/2011  . Anemia of chronic renal failure, stage 3 (moderate) 02/03/2014    has Other pancytopenia; Sedimentation rate elevation; Elevated liver enzymes; GI bleed; Microcytic anemia; Orthostatic hypotension; Bradycardia; Renal failure; Autoimmune hepatitis; HTN (hypertension), benign; Abdominal pain, acute, generalized; Nausea; Diarrhea; Hypomagnesemia; Hypokalemia;  Prolonged Q-T interval on ECG; UTI (urinary tract infection); Gallstones; Thrombocytopenia; Renal calculus; Chronic back pain; Leg pain; Rheumatoid arthritis; Acute renal insufficiency; Hypoalbuminemia; Guaiac positive stools; UGI bleed; Gastric AVM; Candida esophagitis; Anemia of chronic disease; PAT (paroxysmal atrial tachycardia); Hypertrophic cardiomyopathy; Hypersplenism; Iron deficiency anemia due to chronic blood loss; AVM (congenital arteriovenous malformation); Atrial tachycardia; Atrial flutter; Atrial fibrillation with RVR; Healthcare-associated pneumonia; COPD (chronic obstructive pulmonary disease); HCAP (healthcare-associated pneumonia); Atrial fibrillation with rapid ventricular response; Atrial flutter with rapid ventricular response; Chronic renal disease, stage 3, moderately decreased glomerular filtration rate between 30-59 mL/min/1.73 square meter; and Anemia of chronic renal failure, stage 3 (moderate) on her problem list.     is allergic to iohexol; ivp dye; naprosyn; red blood cells; verapamil; vicodin; and sulfa antibiotics.  Eileen Santana had no medications administered during this visit.  Past Surgical History  Procedure Laterality Date  . Colonoscopy  04/2010  . Upper gastrointestinal endoscopy  04/2010  . Esophagogastroduodenoscopy  09/20/2011    Status post APC.  Marland Kitchen Esophagogastroduodenoscopy  09/20/2011    Procedure: ESOPHAGOGASTRODUODENOSCOPY (EGD);  Surgeon: Malissa Hippo, MD;  Location: [facility];  Service: Endoscopy;  Laterality: N/A;  . Cyst removal hand      Elbow    Denies any headaches, dizziness, double vision, fevers, chills, night sweats, nausea, vomiting, diarrhea, constipation, chest pain, heart palpitations, shortness of breath, blood in stool, black tarry stool, urinary pain, urinary burning, urinary frequency, hematuria.   PHYSICAL EXAMINATION  ECOG PERFORMANCE STATUS: 1 - Symptomatic but completely ambulatory  There were no vitals filed for this  visit.  GENERAL:alert, no distress, well nourished, well developed, comfortable, cooperative and smiling SKIN: skin color, texture, turgor are normal, no rashes or significant lesions HEAD: Normocephalic, No masses, lesions, tenderness or abnormalities EYES: normal, PERRLA, EOMI, Conjunctiva are pink and non-injected EARS: External ears normal OROPHARYNX:lips, buccal mucosa, and tongue normal and mucous membranes are moist  NECK: supple, no adenopathy, thyroid normal size, non-tender, without nodularity, no stridor, non-tender, trachea midline LYMPH:  no palpable lymphadenopathy BREAST:not examined LUNGS: clear to auscultation and percussion HEART: regular rate & rhythm, no murmurs, no gallops, S1 normal and S2 normal ABDOMEN:abdomen soft and normal bowel sounds BACK: Back symmetric, no curvature. EXTREMITIES:less then 2 second capillary refill, no joint deformities, effusion, or inflammation, no skin discoloration, no cyanosis  NEURO: alert & oriented x 3 with fluent speech, no focal motor/sensory deficits, gait normal   LABORATORY DATA: CBC    Component Value Date/Time   WBC 5.4 02/03/2014 1211   RBC 4.13 02/03/2014 1211   RBC 3.62* 03/16/2012 0937   HGB 11.5* 02/03/2014 1211   HCT 34.7* 02/03/2014 1211   PLT 76* 02/03/2014 1211   MCV 84.0 02/03/2014 1211   MCH 27.8 02/03/2014 1211   MCHC 33.1 02/03/2014 1211   RDW 19.8* 02/03/2014 1211   LYMPHSABS 1.0 02/03/2014 1211   MONOABS 0.4 02/03/2014 1211   EOSABS 0.2 02/03/2014 1211   BASOSABS 0.0 02/03/2014 1211      Chemistry      Component Value Date/Time  NA 135 01/06/2014 0603   K 3.7 01/06/2014 0603   CL 97 01/06/2014 0603   CO2 33* 01/06/2014 0603   BUN 22 01/06/2014 0603   CREATININE 1.28* 01/06/2014 0603   CREATININE 1.04 08/12/2013 1042      Component Value Date/Time   CALCIUM 10.4 01/06/2014 0603   ALKPHOS 61 12/23/2013 1311   AST 61* 12/23/2013 1311   ALT 12 12/23/2013 1311   BILITOT 1.0 12/23/2013 1311          ASSESSMENT AND PLAN:  Iron deficiency anemia due to chronic blood loss We will continue to monitor labs and administer IV iron as needed per lab values.  IDA is secondary to chronic GI blood loss.  Labs every 4 weeks: CBC Diff, Ferritin.  Return in 3 months for follow-up.   Anemia of chronic disease Secondary to rheumatoid arthritis.  Labs every 4 weeks: CBC diff   Anemia of chronic renal failure, stage 3 (moderate) Causing anemia, requiring ESA intervention from time to time.  Given recent Hemoglobin values, supportive therapy plan is deleted since ESA dosing was inappropriate.  Will re-institute if needed.  Labs every 4 weeks: CBC diff.   THERAPY PLAN:  We will continue monitoring labs and intervene if needed.  All questions were answered. The patient knows to call the clinic with any problems, questions or concerns. We can certainly see the patient much sooner if necessary.  Patient and plan discussed with Dr. Loma Messing and she is in agreement with the aforementioned.   Robt Okuda 02/03/2014

## 2014-02-05 ENCOUNTER — Other Ambulatory Visit (HOSPITAL_COMMUNITY): Payer: Self-pay | Admitting: Oncology

## 2014-02-05 DIAGNOSIS — D500 Iron deficiency anemia secondary to blood loss (chronic): Secondary | ICD-10-CM

## 2014-02-06 ENCOUNTER — Encounter (HOSPITAL_COMMUNITY): Payer: Self-pay

## 2014-02-06 ENCOUNTER — Encounter (HOSPITAL_BASED_OUTPATIENT_CLINIC_OR_DEPARTMENT_OTHER): Payer: Medicare Other

## 2014-02-06 VITALS — BP 90/59 | HR 72 | Temp 97.7°F | Resp 18

## 2014-02-06 DIAGNOSIS — D500 Iron deficiency anemia secondary to blood loss (chronic): Secondary | ICD-10-CM

## 2014-02-06 MED ORDER — SODIUM CHLORIDE 0.9 % IV SOLN
Freq: Once | INTRAVENOUS | Status: AC
  Administered 2014-02-06: 13:00:00 via INTRAVENOUS

## 2014-02-06 MED ORDER — SODIUM CHLORIDE 0.9 % IV SOLN
250.0000 mg | Freq: Once | INTRAVENOUS | Status: AC
  Administered 2014-02-06: 250 mg via INTRAVENOUS
  Filled 2014-02-06: qty 20

## 2014-02-06 MED ORDER — SODIUM CHLORIDE 0.9 % IJ SOLN
10.0000 mL | Freq: Once | INTRAMUSCULAR | Status: AC
  Administered 2014-02-06: 10 mL via INTRAVENOUS

## 2014-02-06 NOTE — Progress Notes (Unsigned)
Tolerated well

## 2014-02-13 ENCOUNTER — Encounter (HOSPITAL_COMMUNITY): Payer: Self-pay

## 2014-02-13 ENCOUNTER — Encounter (HOSPITAL_BASED_OUTPATIENT_CLINIC_OR_DEPARTMENT_OTHER): Payer: Medicare Other

## 2014-02-13 VITALS — BP 106/58 | HR 65 | Temp 97.8°F | Resp 18

## 2014-02-13 DIAGNOSIS — D500 Iron deficiency anemia secondary to blood loss (chronic): Secondary | ICD-10-CM

## 2014-02-13 MED ORDER — SODIUM CHLORIDE 0.9 % IV SOLN
250.0000 mg | Freq: Once | INTRAVENOUS | Status: AC
  Administered 2014-02-13: 250 mg via INTRAVENOUS
  Filled 2014-02-13: qty 20

## 2014-02-13 MED ORDER — SODIUM CHLORIDE 0.9 % IV SOLN: INTRAVENOUS | Status: DC

## 2014-02-14 ENCOUNTER — Ambulatory Visit (HOSPITAL_COMMUNITY): Payer: Medicare Other

## 2014-02-17 ENCOUNTER — Ambulatory Visit (INDEPENDENT_AMBULATORY_CARE_PROVIDER_SITE_OTHER): Payer: Medicare Other | Admitting: Internal Medicine

## 2014-03-03 ENCOUNTER — Encounter (HOSPITAL_COMMUNITY): Payer: Medicare Other

## 2014-03-03 ENCOUNTER — Telehealth (HOSPITAL_COMMUNITY): Payer: Self-pay | Admitting: *Deleted

## 2014-03-03 ENCOUNTER — Encounter (HOSPITAL_BASED_OUTPATIENT_CLINIC_OR_DEPARTMENT_OTHER): Payer: Medicare Other

## 2014-03-03 ENCOUNTER — Other Ambulatory Visit (HOSPITAL_COMMUNITY): Payer: Self-pay | Admitting: Oncology

## 2014-03-03 DIAGNOSIS — D638 Anemia in other chronic diseases classified elsewhere: Secondary | ICD-10-CM

## 2014-03-03 DIAGNOSIS — K922 Gastrointestinal hemorrhage, unspecified: Secondary | ICD-10-CM

## 2014-03-03 DIAGNOSIS — N183 Chronic kidney disease, stage 3 unspecified: Secondary | ICD-10-CM

## 2014-03-03 DIAGNOSIS — M069 Rheumatoid arthritis, unspecified: Secondary | ICD-10-CM

## 2014-03-03 DIAGNOSIS — D500 Iron deficiency anemia secondary to blood loss (chronic): Secondary | ICD-10-CM | POA: Diagnosis not present

## 2014-03-03 DIAGNOSIS — D631 Anemia in chronic kidney disease: Secondary | ICD-10-CM

## 2014-03-03 DIAGNOSIS — D649 Anemia, unspecified: Secondary | ICD-10-CM

## 2014-03-03 LAB — CBC WITH DIFFERENTIAL/PLATELET
BASOS ABS: 0 10*3/uL (ref 0.0–0.1)
BASOS PCT: 1 % (ref 0–1)
Eosinophils Absolute: 0.1 10*3/uL (ref 0.0–0.7)
Eosinophils Relative: 3 % (ref 0–5)
HEMATOCRIT: 39.9 % (ref 36.0–46.0)
Hemoglobin: 13.6 g/dL (ref 12.0–15.0)
LYMPHS PCT: 20 % (ref 12–46)
Lymphs Abs: 1 10*3/uL (ref 0.7–4.0)
MCH: 28.5 pg (ref 26.0–34.0)
MCHC: 34.1 g/dL (ref 30.0–36.0)
MCV: 83.6 fL (ref 78.0–100.0)
MONO ABS: 0.6 10*3/uL (ref 0.1–1.0)
MONOS PCT: 14 % — AB (ref 3–12)
NEUTROS ABS: 3 10*3/uL (ref 1.7–7.7)
Neutrophils Relative %: 63 % (ref 43–77)
Platelets: 93 10*3/uL — ABNORMAL LOW (ref 150–400)
RBC: 4.77 MIL/uL (ref 3.87–5.11)
RDW: 21.3 % — ABNORMAL HIGH (ref 11.5–15.5)
Smear Review: DECREASED
WBC: 4.8 10*3/uL (ref 4.0–10.5)

## 2014-03-03 LAB — FERRITIN: Ferritin: 260 ng/mL (ref 10–291)

## 2014-03-03 NOTE — Telephone Encounter (Signed)
All pain medications have a side effect of sleepiness.  She can try OTC pain medication which should be less sedating.  Eileen Santana

## 2014-03-03 NOTE — Progress Notes (Signed)
LABS FOR CBCD,FERR  

## 2014-03-03 NOTE — Telephone Encounter (Signed)
Eileen Santana arrived today for labs and possible injection. Hgb 13.6. No injection needed and no orders in supportive therapy plan. She reports that she has had back pain and leg swelling since last iron infusion. She saw her PCP on 2/16 and he prescribed hydrocodone for back pain. She does not like the way this makes her feel--sleepy.

## 2014-03-04 ENCOUNTER — Telehealth (INDEPENDENT_AMBULATORY_CARE_PROVIDER_SITE_OTHER): Payer: Self-pay | Admitting: *Deleted

## 2014-03-04 NOTE — Telephone Encounter (Signed)
She was given Ferric Gluconate.  In the past she received Feraheme.  Next time we should go back to [facility] and see if she does better.

## 2014-03-04 NOTE — Telephone Encounter (Signed)
I think Eileen Santana is more concerned over pain and not feeling well since iron infusion because she has not experienced this before.

## 2014-03-04 NOTE — Telephone Encounter (Signed)
Eileen Santana is needing her ursodiol refilled. She thinks [name] found this medication at [facility] in [place]. Her return phone number is [phone]. Chandy said this pharmacy keeps this medicine on file. Just this medicine only.

## 2014-03-05 NOTE — Telephone Encounter (Signed)
[facility] was called/Nathan. A refill request for the patient's Urso 300 mg Take 1 by mouth 3 times daily #90 -5 refills was called in. They will contact patient for delivery,I called patient to make her aware.

## 2014-03-18 ENCOUNTER — Ambulatory Visit (INDEPENDENT_AMBULATORY_CARE_PROVIDER_SITE_OTHER): Payer: Medicare Other | Admitting: Internal Medicine

## 2014-03-18 ENCOUNTER — Encounter (INDEPENDENT_AMBULATORY_CARE_PROVIDER_SITE_OTHER): Payer: Self-pay | Admitting: Internal Medicine

## 2014-03-18 VITALS — BP 118/72 | HR 82 | Temp 97.7°F | Resp 18 | Ht 66.0 in | Wt 153.0 lb

## 2014-03-18 DIAGNOSIS — Z8639 Personal history of other endocrine, nutritional and metabolic disease: Secondary | ICD-10-CM

## 2014-03-18 DIAGNOSIS — K754 Autoimmune hepatitis: Secondary | ICD-10-CM

## 2014-03-18 DIAGNOSIS — K219 Gastro-esophageal reflux disease without esophagitis: Secondary | ICD-10-CM

## 2014-03-18 LAB — HEPATIC FUNCTION PANEL
ALBUMIN: 2.9 g/dL — AB (ref 3.5–5.2)
ALK PHOS: 52 U/L (ref 39–117)
ALT: 15 U/L (ref 0–35)
AST: 62 U/L — ABNORMAL HIGH (ref 0–37)
BILIRUBIN DIRECT: 0.4 mg/dL — AB (ref 0.0–0.3)
BILIRUBIN INDIRECT: 0.8 mg/dL (ref 0.2–1.2)
TOTAL PROTEIN: 6.3 g/dL (ref 6.0–8.3)
Total Bilirubin: 1.2 mg/dL (ref 0.2–1.2)

## 2014-03-18 LAB — BASIC METABOLIC PANEL
BUN: 24 mg/dL — AB (ref 6–23)
CO2: 28 mEq/L (ref 19–32)
CREATININE: 1.37 mg/dL — AB (ref 0.50–1.10)
Calcium: 10.5 mg/dL (ref 8.4–10.5)
Chloride: 99 mEq/L (ref 96–112)
Glucose, Bld: 108 mg/dL — ABNORMAL HIGH (ref 70–99)
POTASSIUM: 4.8 meq/L (ref 3.5–5.3)
Sodium: 138 mEq/L (ref 135–145)

## 2014-03-18 LAB — MAGNESIUM: Magnesium: 1.5 mg/dL (ref 1.5–2.5)

## 2014-03-18 NOTE — Patient Instructions (Signed)
Keep Tylenol intake to no more than 6 tablets per day(each tablet is 325 mg). Physician will call with results of blood tests.

## 2014-03-18 NOTE — Progress Notes (Signed)
Presenting complaint;  Patient is here for follow-up of autoimmune hepatitis and GERD.  Subjective:  Patient is 80 year old African female who is here for scheduled visit. She was last seen in October 2015. Her only complaint is one of back pain that she's had for about 6 weeks. Pain waxes and wanes but it is constant and she gets relief when she lies flat. She is not having any difficulty walking or numbness in her lower extremities. She believes pain started after she had IV infusion of iron and add Aranesp. She has tried Vicodin but it has not helped. She was given this prescription by Dr. Loleta Chance. She says heartburn is well controlled with every other day PPI. She denies dysphagia nausea vomiting abdominal pain melena or rectal bleeding. Bowels move daily. She does not take OTC NSAIDs. She takes furosemide nor more than 3-4 times in a month.   Current Medications: Outpatient Encounter Prescriptions as of 03/18/2014  Medication Sig  . acetaminophen (TYLENOL) 325 MG tablet Take 650 mg by mouth every 6 (six) hours as needed. For pain  . azaTHIOprine (IMURAN) 50 MG tablet TAKE 1 AND 1/2 TABLETS BY MOUTH ONCE DAILY  . Cholecalciferol (VITAMIN D) 2000 UNITS tablet Take 2,000 Units by mouth daily.  . Coenzyme Q10 200 MG capsule Take 200 mg by mouth daily.  Marland Kitchen DEXILANT 60 MG capsule TAKE 1 CAPSULE BY MOUTH EVERY OTHER DAY  . diclofenac sodium (VOLTAREN) 1 % GEL Apply 1 application topically daily as needed (for pain). Apply to back and legs  . diltiazem (TIAZAC) 360 MG 24 hr capsule Take 1 capsule (360 mg total) by mouth daily.  . fish oil-omega-3 fatty acids 1000 MG capsule Take 1 g by mouth daily.  . folic acid (FOLVITE) 800 MCG tablet Take 800 mcg by mouth daily.   . furosemide (LASIX) 20 MG tablet Take 20 mg by mouth daily as needed. For fluid retention  . Magnesium 400 MG TABS Take 1 tablet by mouth 2 (two) times daily.  . metoprolol (LOPRESSOR) 50 MG tablet Take 1.5 tablets (75 mg total) by  mouth 2 (two) times daily.  . Multiple Vitamins-Minerals (MULTIVITAMIN WITH MINERALS) tablet Take 1 tablet by mouth daily.    . potassium chloride SA (K-DUR,KLOR-CON) 20 MEQ tablet TAKE 1 TABLET BY MOUTH ONCE DAILY  . predniSONE (DELTASONE) 10 MG tablet Take 1 tablet (10 mg total) by mouth daily with breakfast.  . sodium chloride 0.9 % SOLN 100 mL with ferumoxytol 510 MG/17ML SOLN 1,020 mg Inject 510 mg into the vein as needed (IF HEMOGLOBIN IS LESS THAN 10.).   Marland Kitchen spironolactone (ALDACTONE) 50 MG tablet TAKE 1 TABLET BY MOUTH DAILY  . Travoprost, BAK Free, (TRAVATAN) 0.004 % SOLN ophthalmic solution Place 1 drop into both eyes at bedtime.  . ursodiol (ACTIGALL) 250 MG tablet TAKE 2 TABLETS BY MOUTH TWICE DAILY  . vitamin B-12 (CYANOCOBALAMIN) 1000 MCG tablet Take 500 mcg by mouth daily.   . Darbepoetin Alfa (ARANESP) 500 MCG/ML SOSY injection Inject 500 mcg into the skin every 21 ( twenty-one) days.     Objective: Blood pressure 118/72, pulse 82, temperature 97.7 F (36.5 C), temperature source Oral, resp. rate 18, height 5\' 6"  (1.676 m), weight 153 lb (69.4 kg). Patient is alert and in no acute distress. She has round facies. Conjunctiva is pink. Sclera is nonicteric Oropharyngeal mucosa is normal. She has upper and lower dentures. No neck masses or thyromegaly noted. Cardiac exam with regular rhythm normal S1 and S2.  Faint systolic ejection murmur noted at left lower sternal border. Lungs are clear to auscultation. Abdomen is symmetrical and soft without tenderness organomegaly or masses. No LE edema or clubbing noted.  Labs/studies Results: Lab data from 03/03/2014  WBC 4.8, H&H 13.6 and 39.9 and platelet count 93K  Serum ferritin 260   LFTs from 12/23/2013 Total bilirubin 1.0 AP 61, AST 61 and ALT 12. Serum albumin 2.5.  Assessment:  #1. Autoimmune hepatitis. Clinically she remains in remission. AST remains mildly elevated with normal ALT. Therefore I feel AST elevation is  due to known hepatic sources. Could be related to rheumatoid arthritis. Low serum albumin over 4 months ago could be due to hepatic disease. #2. GERD. Symptoms well controlled with every other day PPI. #3. History of hypomagnesemia. Serum magnesium has been normal with by mouth supplement. #4. History of iron deficiency anemia. Anemia has corrected with IV and infusion. #5. Acute on chronic low back pain most likely secondary to arthritis. Will defer evaluation to Dr. Loleta Chance  Plan: She will keep Tylenol daily intake to 2 g or less per day/ Patient will go to the lab for sedimentation rate, LFTs serum magnesium and metabolic 7. Patient advised to contact Dr. Loleta Chance regarding her constant lower back pain. Office visit in 3 months.

## 2014-03-19 LAB — SEDIMENTATION RATE: Sed Rate: 34 mm/hr — ABNORMAL HIGH (ref 0–30)

## 2014-03-20 ENCOUNTER — Encounter (INDEPENDENT_AMBULATORY_CARE_PROVIDER_SITE_OTHER): Payer: Self-pay | Admitting: *Deleted

## 2014-03-24 ENCOUNTER — Telehealth (INDEPENDENT_AMBULATORY_CARE_PROVIDER_SITE_OTHER): Payer: Self-pay | Admitting: *Deleted

## 2014-03-24 NOTE — Telephone Encounter (Signed)
Patient's daughter presented to the office stating that her Mother's Prednisone had been decreased to 7.5 mg daily. All that they had was the 10 mg and that they would appreciate 7.5 being called into [facility]. The pharmacy was called and the 7.5 mg 1 month was called to the pharmacy with 5 refills. Patient was called and made aware.

## 2014-03-25 ENCOUNTER — Telehealth (INDEPENDENT_AMBULATORY_CARE_PROVIDER_SITE_OTHER): Payer: Self-pay | Admitting: *Deleted

## 2014-03-25 DIAGNOSIS — K754 Autoimmune hepatitis: Secondary | ICD-10-CM

## 2014-03-25 NOTE — Telephone Encounter (Signed)
Per Dr.Rehman the patient will need to have labs drawn in 4 weeks.   

## 2014-03-27 ENCOUNTER — Other Ambulatory Visit (HOSPITAL_COMMUNITY): Payer: Self-pay | Admitting: Family Medicine

## 2014-03-27 ENCOUNTER — Ambulatory Visit (HOSPITAL_COMMUNITY)
Admission: RE | Admit: 2014-03-27 | Discharge: 2014-03-27 | Disposition: A | Discharge status: Home / Self Care | Payer: Medicare Other | Source: Ambulatory Visit | Attending: Family Medicine | Admitting: Family Medicine

## 2014-03-27 DIAGNOSIS — R52 Pain, unspecified: Secondary | ICD-10-CM

## 2014-03-27 DIAGNOSIS — J40 Bronchitis, not specified as acute or chronic: Secondary | ICD-10-CM | POA: Diagnosis not present

## 2014-03-27 DIAGNOSIS — M5416 Radiculopathy, lumbar region: Secondary | ICD-10-CM | POA: Diagnosis not present

## 2014-03-27 DIAGNOSIS — R05 Cough: Secondary | ICD-10-CM | POA: Diagnosis not present

## 2014-03-27 DIAGNOSIS — R059 Cough, unspecified: Secondary | ICD-10-CM

## 2014-03-31 ENCOUNTER — Emergency Department (HOSPITAL_COMMUNITY): Payer: Medicare Other

## 2014-03-31 ENCOUNTER — Emergency Department (HOSPITAL_COMMUNITY)
Admission: EM | Admit: 2014-03-31 | Discharge: 2014-03-31 | Disposition: A | Discharge status: Home / Self Care | Payer: Medicare Other | Source: Home / Self Care | Attending: Emergency Medicine | Admitting: Emergency Medicine

## 2014-03-31 ENCOUNTER — Encounter (HOSPITAL_COMMUNITY): Payer: Self-pay | Admitting: Emergency Medicine

## 2014-03-31 ENCOUNTER — Encounter (HOSPITAL_BASED_OUTPATIENT_CLINIC_OR_DEPARTMENT_OTHER): Payer: Medicare Other

## 2014-03-31 ENCOUNTER — Encounter (HOSPITAL_COMMUNITY): Payer: Medicare Other

## 2014-03-31 DIAGNOSIS — D649 Anemia, unspecified: Secondary | ICD-10-CM

## 2014-03-31 DIAGNOSIS — D638 Anemia in other chronic diseases classified elsewhere: Secondary | ICD-10-CM | POA: Diagnosis not present

## 2014-03-31 DIAGNOSIS — N183 Chronic kidney disease, stage 3 unspecified: Secondary | ICD-10-CM

## 2014-03-31 DIAGNOSIS — N289 Disorder of kidney and ureter, unspecified: Secondary | ICD-10-CM | POA: Insufficient documentation

## 2014-03-31 DIAGNOSIS — D500 Iron deficiency anemia secondary to blood loss (chronic): Secondary | ICD-10-CM

## 2014-03-31 DIAGNOSIS — Z8719 Personal history of other diseases of the digestive system: Secondary | ICD-10-CM | POA: Insufficient documentation

## 2014-03-31 DIAGNOSIS — Z79899 Other long term (current) drug therapy: Secondary | ICD-10-CM | POA: Insufficient documentation

## 2014-03-31 DIAGNOSIS — Z720 Tobacco use: Secondary | ICD-10-CM | POA: Insufficient documentation

## 2014-03-31 DIAGNOSIS — D61818 Other pancytopenia: Secondary | ICD-10-CM

## 2014-03-31 DIAGNOSIS — Z862 Personal history of diseases of the blood and blood-forming organs and certain disorders involving the immune mechanism: Secondary | ICD-10-CM | POA: Insufficient documentation

## 2014-03-31 DIAGNOSIS — K922 Gastrointestinal hemorrhage, unspecified: Secondary | ICD-10-CM

## 2014-03-31 DIAGNOSIS — R1032 Left lower quadrant pain: Secondary | ICD-10-CM | POA: Insufficient documentation

## 2014-03-31 DIAGNOSIS — I129 Hypertensive chronic kidney disease with stage 1 through stage 4 chronic kidney disease, or unspecified chronic kidney disease: Secondary | ICD-10-CM | POA: Insufficient documentation

## 2014-03-31 DIAGNOSIS — R4701 Aphasia: Secondary | ICD-10-CM | POA: Diagnosis not present

## 2014-03-31 DIAGNOSIS — Z87440 Personal history of urinary (tract) infections: Secondary | ICD-10-CM | POA: Insufficient documentation

## 2014-03-31 DIAGNOSIS — D509 Iron deficiency anemia, unspecified: Secondary | ICD-10-CM | POA: Insufficient documentation

## 2014-03-31 DIAGNOSIS — Z8639 Personal history of other endocrine, nutritional and metabolic disease: Secondary | ICD-10-CM | POA: Insufficient documentation

## 2014-03-31 DIAGNOSIS — Z8679 Personal history of other diseases of the circulatory system: Secondary | ICD-10-CM | POA: Insufficient documentation

## 2014-03-31 DIAGNOSIS — M069 Rheumatoid arthritis, unspecified: Secondary | ICD-10-CM

## 2014-03-31 DIAGNOSIS — I639 Cerebral infarction, unspecified: Secondary | ICD-10-CM | POA: Diagnosis not present

## 2014-03-31 DIAGNOSIS — Z8673 Personal history of transient ischemic attack (TIA), and cerebral infarction without residual deficits: Secondary | ICD-10-CM | POA: Insufficient documentation

## 2014-03-31 DIAGNOSIS — Z87442 Personal history of urinary calculi: Secondary | ICD-10-CM | POA: Insufficient documentation

## 2014-03-31 DIAGNOSIS — D631 Anemia in chronic kidney disease: Secondary | ICD-10-CM

## 2014-03-31 DIAGNOSIS — Z791 Long term (current) use of non-steroidal anti-inflammatories (NSAID): Secondary | ICD-10-CM | POA: Insufficient documentation

## 2014-03-31 DIAGNOSIS — Z85038 Personal history of other malignant neoplasm of large intestine: Secondary | ICD-10-CM | POA: Insufficient documentation

## 2014-03-31 DIAGNOSIS — Z9889 Other specified postprocedural states: Secondary | ICD-10-CM | POA: Insufficient documentation

## 2014-03-31 DIAGNOSIS — R109 Unspecified abdominal pain: Secondary | ICD-10-CM

## 2014-03-31 DIAGNOSIS — Z8619 Personal history of other infectious and parasitic diseases: Secondary | ICD-10-CM | POA: Insufficient documentation

## 2014-03-31 DIAGNOSIS — Z8709 Personal history of other diseases of the respiratory system: Secondary | ICD-10-CM | POA: Insufficient documentation

## 2014-03-31 HISTORY — DX: Low back pain: M54.5

## 2014-03-31 HISTORY — DX: Low back pain, unspecified: M54.50

## 2014-03-31 LAB — CBC WITH DIFFERENTIAL/PLATELET
Basophils Absolute: 0 10*3/uL (ref 0.0–0.1)
Basophils Absolute: 0 10*3/uL (ref 0.0–0.1)
Basophils Relative: 1 % (ref 0–1)
Basophils Relative: 0 % (ref 0–1)
EOS ABS: 0.1 10*3/uL (ref 0.0–0.7)
EOS ABS: 0.1 10*3/uL (ref 0.0–0.7)
EOS PCT: 1 % (ref 0–5)
Eosinophils Relative: 2 % (ref 0–5)
HCT: 37.2 % (ref 36.0–46.0)
HCT: 38.2 % (ref 36.0–46.0)
Hemoglobin: 12.5 g/dL (ref 12.0–15.0)
Hemoglobin: 13.2 g/dL (ref 12.0–15.0)
LYMPHS ABS: 0.4 10*3/uL — AB (ref 0.7–4.0)
LYMPHS PCT: 7 % — AB (ref 12–46)
Lymphocytes Relative: 12 % (ref 12–46)
Lymphs Abs: 0.5 10*3/uL — ABNORMAL LOW (ref 0.7–4.0)
MCH: 28.8 pg (ref 26.0–34.0)
MCH: 29.5 pg (ref 26.0–34.0)
MCHC: 33.6 g/dL (ref 30.0–36.0)
MCHC: 34.6 g/dL (ref 30.0–36.0)
MCV: 85.7 fL (ref 78.0–100.0)
MCV: 85.3 fL (ref 78.0–100.0)
MONO ABS: 0.5 10*3/uL (ref 0.1–1.0)
MONOS PCT: 8 % (ref 3–12)
Monocytes Absolute: 0.3 10*3/uL (ref 0.1–1.0)
Monocytes Relative: 8 % (ref 3–12)
NEUTROS PCT: 77 % (ref 43–77)
Neutro Abs: 3 10*3/uL (ref 1.7–7.7)
Neutro Abs: 4.8 10*3/uL (ref 1.7–7.7)
Neutrophils Relative %: 84 % — ABNORMAL HIGH (ref 43–77)
PLATELETS: 77 10*3/uL — AB (ref 150–400)
PLATELETS: 72 10*3/uL — AB (ref 150–400)
RBC: 4.34 MIL/uL (ref 3.87–5.11)
RBC: 4.48 MIL/uL (ref 3.87–5.11)
RDW: 19.6 % — ABNORMAL HIGH (ref 11.5–15.5)
RDW: 19.5 % — AB (ref 11.5–15.5)
SMEAR REVIEW: DECREASED
WBC: 3.9 10*3/uL — ABNORMAL LOW (ref 4.0–10.5)
WBC: 5.7 10*3/uL (ref 4.0–10.5)

## 2014-03-31 LAB — URINALYSIS, ROUTINE W REFLEX MICROSCOPIC
Glucose, UA: NEGATIVE mg/dL
HGB URINE DIPSTICK: NEGATIVE
Leukocytes, UA: NEGATIVE
NITRITE: NEGATIVE
PH: 6 (ref 5.0–8.0)
Protein, ur: NEGATIVE mg/dL
Urobilinogen, UA: 0.2 mg/dL (ref 0.0–1.0)

## 2014-03-31 LAB — COMPREHENSIVE METABOLIC PANEL
ALT: 17 U/L (ref 0–35)
ANION GAP: 8 (ref 5–15)
AST: 76 U/L — ABNORMAL HIGH (ref 0–37)
Albumin: 2.7 g/dL — ABNORMAL LOW (ref 3.5–5.2)
Alkaline Phosphatase: 57 U/L (ref 39–117)
BUN: 20 mg/dL (ref 6–23)
CO2: 25 mmol/L (ref 19–32)
CREATININE: 1.11 mg/dL — AB (ref 0.50–1.10)
Calcium: 10.2 mg/dL (ref 8.4–10.5)
Chloride: 100 mmol/L (ref 96–112)
GFR calc non Af Amer: 45 mL/min — ABNORMAL LOW (ref >90)
GFR, EST AFRICAN AMERICAN: 53 mL/min — AB (ref >90)
GLUCOSE: 143 mg/dL — AB (ref 70–99)
POTASSIUM: 4.2 mmol/L (ref 3.5–5.1)
Sodium: 133 mmol/L — ABNORMAL LOW (ref 135–145)
TOTAL PROTEIN: 6.7 g/dL (ref 6.0–8.3)
Total Bilirubin: 1 mg/dL (ref 0.3–1.2)

## 2014-03-31 LAB — LIPASE, BLOOD: LIPASE: 43 U/L (ref 11–59)

## 2014-03-31 MED ORDER — FENTANYL CITRATE 0.05 MG/ML IJ SOLN
6.2500 ug | INTRAMUSCULAR | Status: DC | PRN
Start: 2014-03-31 — End: 2014-04-01
  Administered 2014-03-31: 6.5 ug via INTRAVENOUS
  Filled 2014-03-31: qty 2

## 2014-03-31 MED ORDER — SODIUM CHLORIDE 0.9 % IV SOLN
INTRAVENOUS | Status: DC
  Administered 2014-03-31: 20:00:00 via INTRAVENOUS

## 2014-03-31 NOTE — Progress Notes (Signed)
Labs drawn

## 2014-03-31 NOTE — ED Provider Notes (Signed)
CSN: [id number]     Arrival date & time 03/31/14  1753 History   First MD Initiated Contact with Patient 03/31/14 1818     Chief Complaint  Patient presents with  . Abdominal Pain     HPI Pt was seen at 1835.  Per pt and her family, c/o gradual onset and persistence of constant left sided abd "pain" since this morning. Has not been associated with any other symptoms. Denies hx of similar abd pain. Denies N/V, no diarrhea, no fevers, no back pain, no rash, no CP/SOB, no black or blood in stools.       Past Medical History  Diagnosis Date  . History of GI bleed     Associated with NSAIDS  . Iron deficiency anemia     Transfusion dependent  . DJD (degenerative joint disease)   . Essential hypertension, benign   . History of colitis   . Ileitis   . Pancytopenia   . Autoimmune hepatitis     With leukocytoclastic vasculitis  . Heart block AV second degree March 2013    a. Cardiology consult note 06/2013: "Question of previous second degree heart block, although review of cardiology notes indicates that this may have been actually blocked PACs when the patient was on verapamil."  . Bronchitis   . Steroid-induced myopathy   . Renal calculus 08/12/2011  . Rheumatoid arthritis(714.0)   . Colon ulcer 04/2010    NSAID related  . Gastric ulcer 04/2010    NSAID related  . Cancer   . UGI bleed 09/20/2011    Focal area of gastritis oozing of blood  . Gastric AVM 09/20/2011  . Candida esophagitis 09/20/2011  . Paroxysmal atrial fibrillation     Not on anticoag 2/2 hx of GIB/AVM  . Pericarditis     2D Echo EF 65%-70%  . CKD (chronic kidney disease), stage III   . Cholestatic hepatitis   . UTI (lower urinary tract infection)     history  . Paroxysmal atrial flutter     Not on anticoag 2/2 hx of GIB/AVM - dx 06/2013.  . Multifocal atrial tachycardia   . Hypertrophic cardiomyopathy     Echo 03/2011: severe LVH suggesting possble infiltrate cardiomyopathy or advanced hypertensive heart disease,  increased  echogenicity of the ventricular septum as well as the pericardium, EF >70% with end systolicmid-cavitary obliteration of the ventricle, stage 1 DD, mild MR, small IVC.  Marland Kitchen Hypomagnesemia   . Chronic renal disease, stage 3, moderately decreased glomerular filtration rate between 30-59 mL/min/1.73 square meter 02/03/2014  . Anemia of chronic disease 11/25/2011  . Anemia of chronic renal failure, stage 3 (moderate) 02/03/2014  . Low back pain    Past Surgical History  Procedure Laterality Date  . Colonoscopy  04/2010  . Upper gastrointestinal endoscopy  04/2010  . Esophagogastroduodenoscopy  09/20/2011    Status post APC.  Marland Kitchen Esophagogastroduodenoscopy  09/20/2011    Procedure: ESOPHAGOGASTRODUODENOSCOPY (EGD);  Surgeon: Malissa Hippo, MD;  Location: [facility];  Service: Endoscopy;  Laterality: N/A;  . Cyst removal hand      Elbow   Family History  Problem Relation Age of Onset  . Stroke Mother   . Hypertension Sister   . Hypertension Brother   . Heart disease Mother 68  . Heart disease Father 95  . COPD Brother   . Arthritis Brother   . Osteoporosis Sister    History  Substance Use Topics  . Smoking status: Current Some Day Smoker --  0.25 packs/day    Types: Cigarettes  . Smokeless tobacco: Never Used  . Alcohol Use: No    Review of Systems ROS: Statement: All systems negative except as marked or noted in the HPI; Constitutional: Negative for fever and chills. ; ; Eyes: Negative for eye pain, redness and discharge. ; ; ENMT: Negative for ear pain, hoarseness, nasal congestion, sinus pressure and sore throat. ; ; Cardiovascular: Negative for chest pain, palpitations, diaphoresis, dyspnea and peripheral edema. ; ; Respiratory: Negative for cough, wheezing and stridor. ; ; Gastrointestinal: +abd pain. Negative for nausea, vomiting, diarrhea, blood in stool, hematemesis, jaundice and rectal bleeding. . ; ; Genitourinary: Negative for dysuria, flank pain and hematuria. ; ;  Musculoskeletal: Negative for back pain and neck pain. Negative for swelling and trauma.; ; Skin: Negative for pruritus, rash, abrasions, blisters, bruising and skin lesion.; ; Neuro: Negative for headache, lightheadedness and neck stiffness. Negative for weakness, altered level of consciousness , altered mental status, extremity weakness, paresthesias, involuntary movement, seizure and syncope.     Allergies  Iohexol; Ivp dye; Naprosyn; Red blood cells; Verapamil; Vicodin; and Sulfa antibiotics  Home Medications   Prior to Admission medications   Medication Sig Start Date End Date Taking? Authorizing Provider  acetaminophen (TYLENOL) 325 MG tablet Take 650 mg by mouth every 6 (six) hours as needed. For pain   Yes Historical Provider, MD  azaTHIOprine (IMURAN) 50 MG tablet TAKE 1 AND 1/2 TABLETS BY MOUTH ONCE DAILY 11/18/13  Yes Malissa Hippo, MD  Cholecalciferol (VITAMIN D) 2000 UNITS tablet Take 2,000 Units by mouth daily.   Yes Historical Provider, MD  Coenzyme Q10 200 MG capsule Take 200 mg by mouth daily.   Yes Historical Provider, MD  Darbepoetin Alfa (ARANESP) 500 MCG/ML SOSY injection Inject 500 mcg into the skin every 21 ( twenty-one) days.   Yes Historical Provider, MD  DEXILANT 60 MG capsule TAKE 1 CAPSULE BY MOUTH EVERY OTHER DAY 11/12/13  Yes Malissa Hippo, MD  diclofenac sodium (VOLTAREN) 1 % GEL Apply 1 application topically daily as needed (for pain). Apply to back and legs   Yes Historical Provider, MD  diltiazem (TIAZAC) 360 MG 24 hr capsule Take 1 capsule (360 mg total) by mouth daily. 01/06/14  Yes Hollice Espy, MD  fish oil-omega-3 fatty acids 1000 MG capsule Take 1 g by mouth daily.   Yes Historical Provider, MD  folic acid (FOLVITE) 800 MCG tablet Take 800 mcg by mouth daily.    Yes Historical Provider, MD  furosemide (LASIX) 20 MG tablet Take 20 mg by mouth daily as needed. For fluid retention 04/26/11  Yes Malissa Hippo, MD  Magnesium 400 MG TABS Take 1 tablet by  mouth 2 (two) times daily.   Yes Historical Provider, MD  metoprolol (LOPRESSOR) 50 MG tablet Take 1.5 tablets (75 mg total) by mouth 2 (two) times daily. 01/06/14  Yes Hollice Espy, MD  Multiple Vitamins-Minerals (MULTIVITAMIN WITH MINERALS) tablet Take 1 tablet by mouth daily.     Yes Historical Provider, MD  potassium chloride SA (K-DUR,KLOR-CON) 20 MEQ tablet TAKE 1 TABLET BY MOUTH ONCE DAILY Patient not taking: Reported on 03/31/2014 01/14/14   Malissa Hippo, MD  sodium chloride 0.9 % SOLN 100 mL with ferumoxytol 510 MG/17ML SOLN 1,020 mg Inject 510 mg into the vein as needed (IF HEMOGLOBIN IS LESS THAN 10.).     Historical Provider, MD  spironolactone (ALDACTONE) 50 MG tablet TAKE 1 TABLET BY MOUTH DAILY  12/11/13   Malissa Hippo, MD  Travoprost, BAK Free, (TRAVATAN) 0.004 % SOLN ophthalmic solution Place 1 drop into both eyes at bedtime. 09/20/11   Elliot Cousin, MD  ursodiol (ACTIGALL) 250 MG tablet TAKE 2 TABLETS BY MOUTH TWICE DAILY    Len Blalock, NP  vitamin B-12 (CYANOCOBALAMIN) 1000 MCG tablet Take 500 mcg by mouth daily.     Historical Provider, MD   BP 134/69 mmHg  Pulse 66  Temp(Src) 98.6 F (37 C) (Oral)  Resp 16  Ht  (1.676 m)  Wt 153 lb (69.4 kg)  BMI 24.71 kg/m2  SpO2 100% Physical Exam  1840: Physical examination:  Nursing notes reviewed; Vital signs and O2 SAT reviewed;  Constitutional: Well developed, Well nourished, Well hydrated, In no acute distress; Head:  Normocephalic, atraumatic; Eyes: EOMI, PERRL, No scleral icterus; ENMT: Mouth and pharynx normal, Mucous membranes moist; Neck: Supple, Full range of motion, No lymphadenopathy; Cardiovascular: Regular rate and rhythm, No gallop; Respiratory: Breath sounds clear & equal bilaterally, No wheezes.  Speaking full sentences with ease, Normal respiratory effort/excursion; Chest: Nontender, Movement normal; Abdomen: Soft, +mild LLQ tenderness to palp. No rebound or guarding. Nondistended, Normal bowel sounds;  Genitourinary: No CVA tenderness; Extremities: Pulses normal, No tenderness, No edema, No calf edema or asymmetry.; Neuro: AA&Ox3, Major CN grossly intact.  Speech clear. No gross focal motor or sensory deficits in extremities.; Skin: Color normal, Warm, Dry.   ED Course  Procedures     EKG Interpretation None      MDM  MDM Reviewed: previous chart, nursing note and vitals Reviewed previous: labs Interpretation: labs and CT scan      Results for orders placed or performed during the hospital encounter of 03/31/14  CBC with Differential  Result Value Ref Range   WBC 5.7 4.0 - 10.5 K/uL   RBC 4.48 3.87 - 5.11 MIL/uL   Hemoglobin 13.2 12.0 - 15.0 g/dL   HCT 16.1 09.6 - 04.5 %   MCV 85.3 78.0 - 100.0 fL   MCH 29.5 26.0 - 34.0 pg   MCHC 34.6 30.0 - 36.0 g/dL   RDW 40.9 (H) 81.1 - 91.4 %   Platelets 72 (L) 150 - 400 K/uL   Neutrophils Relative % 84 (H) 43 - 77 %   Neutro Abs 4.8 1.7 - 7.7 K/uL   Lymphocytes Relative 7 (L) 12 - 46 %   Lymphs Abs 0.4 (L) 0.7 - 4.0 K/uL   Monocytes Relative 8 3 - 12 %   Monocytes Absolute 0.5 0.1 - 1.0 K/uL   Eosinophils Relative 1 0 - 5 %   Eosinophils Absolute 0.1 0.0 - 0.7 K/uL   Basophils Relative 0 0 - 1 %   Basophils Absolute 0.0 0.0 - 0.1 K/uL  Comprehensive metabolic panel  Result Value Ref Range   Sodium 133 (L) 135 - 145 mmol/L   Potassium 4.2 3.5 - 5.1 mmol/L   Chloride 100 96 - 112 mmol/L   CO2 25 19 - 32 mmol/L   Glucose, Bld 143 (H) 70 - 99 mg/dL   BUN 20 6 - 23 mg/dL   Creatinine, Ser 7.82 (H) 0.50 - 1.10 mg/dL   Calcium 95.6 8.4 - 21.3 mg/dL   Total Protein 6.7 6.0 - 8.3 g/dL   Albumin 2.7 (L) 3.5 - 5.2 g/dL   AST 76 (H) 0 - 37 U/L   ALT 17 0 - 35 U/L   Alkaline Phosphatase 57 39 - 117 U/L  Total Bilirubin 1.0 0.3 - 1.2 mg/dL   GFR calc non Af Amer 45 (L) >90 mL/min   GFR calc Af Amer 53 (L) >90 mL/min   Anion gap 8 5 - 15  Lipase, blood  Result Value Ref Range   Lipase 43 11 - 59 U/L  Urinalysis, Routine w  reflex microscopic  Result Value Ref Range   Color, Urine YELLOW YELLOW   APPearance CLEAR CLEAR   Specific Gravity, Urine >1.030 (H) 1.005 - 1.030   pH 6.0 5.0 - 8.0   Glucose, UA NEGATIVE NEGATIVE mg/dL   Hgb urine dipstick NEGATIVE NEGATIVE   Bilirubin Urine SMALL (A) NEGATIVE   Ketones, ur TRACE (A) NEGATIVE mg/dL   Protein, ur NEGATIVE NEGATIVE mg/dL   Urobilinogen, UA 0.2 0.0 - 1.0 mg/dL   Nitrite NEGATIVE NEGATIVE   Leukocytes, UA NEGATIVE NEGATIVE   Ct Abdomen Pelvis Wo Contrast 03/31/2014   CLINICAL DATA:  Patient c/o lower abd pain since this am/ hx HTN, colitis, GI bleed, hepatitis, renal stones, colonic and gastric ulcers and CKDPatient is allergic to IV contrast  EXAM: CT ABDOMEN AND PELVIS WITHOUT CONTRAST  TECHNIQUE: Multidetector CT imaging of the abdomen and pelvis was performed following the standard protocol without IV contrast.  COMPARISON:  08/10/2011.  FINDINGS: Mild cardiomegaly. Mild lung base interstitial thickening similar to the prior study. No pleural effusion.  Liver and spleen are unremarkable. No evidence of acute cholecystitis. Pancreas is unremarkable. No bile duct dilation. Calcified nodes adjacent to the pancreas and along the gastrohepatic ligament are stable. No adrenal masses.  Low-density renal masses, largest arising from the midpole of the left kidney measuring 4.2 cm. These are similar to the prior study consistent with cysts. No hydronephrosis. Normal ureters. Bladder is minimally distended but otherwise unremarkable.  Uterus and adnexa are unremarkable.  Dense atherosclerotic calcifications noted along a normal caliber abdominal aorta and branch vessels.  There are no pathologically enlarged lymph nodes.  No ascites.  There are scattered colonic diverticula. No diverticulitis. Colon otherwise unremarkable. Normal small bowel. Normal appendix.  There are degenerative changes of the lumbar spine. Bones are diffusely demineralized.  IMPRESSION: 1. No acute  findings. 2. No evidence of colitis/diverticulitis. No bowel inflammatory changes. 3. Normal appendix visualized. 4. Cardiomegaly with interstitial thickening at the lung bases. Interstitial thickening may be chronic, and is similar to the prior study. Consider mild congestive heart failure interstitial edema if the patient short of breath. 5. Chronic findings include atherosclerotic calcifications of the aorta, calcified nodes in right upper abdomen, presumed renal cysts and degenerative changes of the lumbar spine.   Electronically Signed   By: Amie Portland M.D.   On: 03/31/2014 21:50   Results for ZAHARA, REMBERT (MRN [id number]) as of 03/31/2014 21:55  Ref. Range 01/06/2014 06:03 03/18/2014 10:26 03/31/2014 18:51  BUN Latest Range: 6-23 mg/dL 22 24 (H) 20  Creatinine Latest Range: 0.50-1.10 mg/dL 0.86 (H) 5.78 (H) 4.69 (H)   Results for KIYO, HEAL (MRN [id number]) as of 03/31/2014 21:55  Ref. Range 02/03/2014 12:11 03/03/2014 11:09 03/31/2014 11:30 03/31/2014 18:51  Platelets Latest Range: 150-400 K/uL 76 (L) 93 (L) 77 (L) 72 (L)    2155:  Labs per baseline. Workup reassuring. VS remain stable. Pt has tol PO well while in the ED without N/V. Pt continues to deny any other complaints. Pt would like to go home now and family would like to take her home. Family states pt already has pain medications at home (norco). Dx and  testing d/w pt and family.  Questions answered.  Verb understanding, agreeable to d/c home with outpt f/u.      Samuel Jester, DO 04/03/14 1332

## 2014-03-31 NOTE — Discharge Instructions (Signed)
°Emergency Department Resource Guide °1) Find a Doctor and Pay Out of Pocket °Although you won't have to find out who is covered by your insurance plan, it is a good idea to ask around and get recommendations. You will then need to call the office and see if the doctor you have chosen will accept you as a new patient and what types of options they offer for patients who are self-pay. Some doctors offer discounts or will set up payment plans for their patients who do not have insurance, but you will need to ask so you aren't surprised when you get to your appointment. ° °2) Contact Your Local Health Department °Not all health departments have doctors that can see patients for sick visits, but many do, so it is worth a call to see if yours does. If you don't know where your local health department is, you can check in your phone book. The CDC also has a tool to help you locate your state's health department, and many state websites also have listings of all of their local health departments. ° °3) Find a Walk-in Clinic °If your illness is not likely to be very severe or complicated, you may want to try a walk in clinic. These are popping up all over the country in pharmacies, drugstores, and shopping centers. They're usually staffed by nurse practitioners or physician assistants that have been trained to treat common illnesses and complaints. They're usually fairly quick and inexpensive. However, if you have serious medical issues or chronic medical problems, these are probably not your best option. ° °No Primary Care Doctor: °- Call [facility] at  832-8000 - they can help you locate a primary care doctor that  accepts your insurance, provides certain services, etc. °- Physician Referral Service- 1-800-533-3463 ° °Chronic Pain Problems: °Organization         Address  Phone   Notes  °[facility]  (336) 297-2271 Patients need to be referred by their primary care doctor.  ° °Medication  Assistance: °Organization         Address  Phone   Notes  °[place] [facility] [address] °[place], [place] [address] (336) 641-8030 --Must be a resident of [place] °-- Must have NO insurance coverage whatsoever (no Medicaid/ Medicare, etc.) °-- The pt. MUST have a primary care doctor that directs their care regularly and follows them in the community °  °[facility]  (866) 331-1348   °[employer]  (888) 892-1162   ° °Agencies that provide inexpensive medical care: °Organization         Address  Phone   Notes  °[facility]  (336) 832-8035   °[facility]    (336) 832-7272   °[facility] [address] °[place], [place] [address] (336) 832-4777   °[facility] [address], °[place] (336) 271-4999   °[facility]    (336) 373-0678   °[facility]    (336) 272-1050   °[facility] ° [address], [place] Phone:  (336) 832-4444, Fax:  (336) 832-4440 Hours of Operation:  9 am - 6 pm, M-F.  Also accepts Medicaid/Medicare and self-pay.  °[facility] ° [address], [place] Phone: (336) 832-3150, Fax: (336) 832-3151. Hours of Operation:  8:30 am - 5:30 pm, M-F.  Also accepts Medicaid and self-pay.  °[facility] [place] [address], [place] Phone: (336) 878-6027   °[facility] [address], [place], [place] (336)723-1848, Ext. 123 Mondays & Thursdays: 7-9 AM.  First 15 patients are seen on a first come, first serve basis. °  ° °Medicaid-accepting [place] Providers: ° °Organization         Address  Phone   Notes  °[facility] [address], [place] (336) 641-2100 Also accepts self-pay patients.  °[facility] [address], [place] ° (336) 856-9996   °[facility] [address], [place]  (336) 288-8857   °[facility] [address] [place] [address], [place] (336) 299-7000   °Veita Bland [address], [place]  ° (336) 373-1557 Only accepts [place] Access Medicaid patients after they have their name applied to their card.  ° °Self-Pay (no insurance) in [place]: ° °Organization         Address  Phone   Notes  °Sickle Cell Patients, [facility] [address], [place] (336) 832-1970   °[facility] [address], [place] (336) 832-4400   °[facility] [place] ° [address], [place] (336) 992-4800   °[facility]/Dr. Osei-Bonsu ° [address] [place] [address], [place] or [address], [place] (336) 841-8500 Phone number for both [place] and [place] locations is the same.  °[facility] [address], [place] (336) 299-0000   °[facility] [address] [place] [address], [place] or [address] (336) 852-7530 °(336) 878-2260   °[facility] [address], [place] (336) 350-1642, phone; (336) 294-5005, fax Sees patients 1st and 3rd Saturday of every month.  Must not qualify for public or private insurance (i.e. Medicaid, Medicare, [place] Health Choice, Veterans' Benefits) • Household income should be no more than 200% of the poverty level •The clinic cannot treat you if you are pregnant or think you are pregnant • Sexually transmitted diseases are not treated at the clinic.  ° ° °Dental Care: °Organization         Address  Phone  Notes  °[place] [facility] [address], [place] (336) 641-6152 Accepts children up to age 21 who are enrolled in Medicaid or [place] Health Choice; pregnant women with a Medicaid card; and children who have applied for Medicaid or [place] Health Choice, but were declined, whose parents can pay a reduced fee at time of service.  °[place]  [facility] [place]  [address], [place] (336) 641-7733 Accepts children up to age 21 who are enrolled in Medicaid or [place] Health Choice; pregnant women with a Medicaid card; and children who have applied for Medicaid or [place] Health Choice, but were declined, whose parents can pay a reduced fee at time of service.  °[facility] ° [address], [place] (336) 641-4533 Patients are seen by appointment only. Walk-ins are not accepted. [facility] will see patients 18 years of age and older. °Monday - Tuesday (8am-5pm) °Most Wednesdays (8:30-5pm) °$30 per visit, cash only  °[facility] ° [address], [place] (336) 641-4533 Patients are seen by appointment only. Walk-ins are not accepted. [facility] will see patients 18 years of age and older. °One   Wednesday Evening (Monthly: Volunteer Based).  $30 per visit, cash only  °[facility]  (919) 537-3737 for adults; Children under age 4, call Graduate Pediatric Dentistry at ([phone]) [phone]. Children aged 4-14, please call (919) [phone] to request a pediatric application. ° Dental services are provided in all areas of dental care including fillings, crowns and bridges, complete and partial dentures, implants, gum treatment, root canals, and extractions. Preventive care is also provided. Treatment is provided to both adults and children. °Patients are selected via a lottery and there is often a waiting list. °  °[facility] [address], °[place] ° (336) 763-8833 [email or web address] °  °[facility] [address], [place], [place] (336)723-1848, Ext. 123 Second and Fourth Thursday of each month, opens at 6:30 AM; Clinic ends at 9 AM.  Patients are seen on a first-come first-served basis, and a limited number are seen during each clinic.  ° °[facility] ° [address], [place], [place] (336) 723-7904    Eligibility Requirements °You must have lived in [place], [place], or [place] counties for at least the last three months. °  You cannot be eligible for state or federal sponsored healthcare insurance, including Veterans Administration, Medicaid, or Medicare. °  You generally cannot be eligible for healthcare insurance through your employer.  °  How to apply: °Eligibility screenings are held every Tuesday and Wednesday afternoon from 1:00 pm until 4:00 pm. You do not need an appointment for the interview!  °[facility] [address], [place], [place] 336-631-2330   °[facility]  336-342-8273   °[facility]  336-703-3100   °[facility]  336-570-6415   ° °Behavioral Health Resources in the Community: °Intensive Outpatient Programs °Organization         Address  Phone  Notes  °[facility] [address], [place], [place] 336-878-6098   °[facility] [address], [place], [place] 336-832-9800   °[facility] [address], [place], [place] ° 336-882-2125   °[facility] [address],  °[place], [place] 1-800-853-5163 or 336-641-4981   °Substance Abuse Resources °Organization         Address  Phone  Notes  °[facility]  336-882-2125   °[facility]  336-784-9470   °[facility]  336-285-9073   °[facility]  336-845-3988   °Residential & Outpatient Substance Abuse Program  1-800-659-3381   °Psychological Services °Organization         Address  Phone  Notes  °[facility]  336- 832-9600   °[facility]  336- 378-7881   °[facility] [address], [place] 1-800-853-5163 or 336-641-4981   ° °Mobile Crisis Teams °Organization         Address  Phone  Notes  °[facility], Mobile Crisis Care Unit  1-877-626-1772   °Assertive °[facility] ° [address]  [place], [place] 336-834-9664   °Sharon DeEsch [address] °[place] 336-554-5454   ° °Self-Help/Support Groups °Organization         Address  Phone             Notes  °[facility] - variety of support groups  336- 373-1402 Call for more information  °Narcotics Anonymous (NA), [facility] [address], °[place]  2 meetings at this location  ° °  Residential Treatment Programs °Organization         Address  Phone  Notes  °[facility] [address],    °[place]  1-866-801-8205   °[facility] ° [address], [place], [place] 704-293-8524   °[facility] [address], [place] 336-845-3988 Admissions: 8am-3pm M-F  °[facility] [address]    °[place], [place] 336-841-1104   °[facility] [address], [place], [place] 336-379-7146   °[facility] [address]  °[place], [place] 336-285-9073   °[facility] - Intensive Outpatient [address], [place], [place] 336-852-3033   °[facility] [address]  °[place], [place] 1-877-615-2722 or 336-784-9470   °[facility] [address] [place], [place] 336-227-7417 Accepts Medicaid  °[facility] [address]  °[place] 1-800-659-3381 Substance Abuse/Addiction Treatment  ° °[place] Behavioral Health Resources °Organization         Address  Phone  Notes  °[facility]  (888) 581-9988   °Julie Brannon, PhD [address] [place], [place]   (336) 349-5553 or (336) 951-0000   °[facility]   [address] °[place], [place] (336) 349-4454   °[facility] [address], [place], [place] (336) 342-8316 Insurance/Medicaid/sponsorship through [facility]  °[facility] [address]                                    [place], [place] (336) 342-8316 Therapy/tele-psych/case    °[facility] [address]  ° [place], [place] (336) 349-2233    °Dr. Arfeen  (336) 349-4544   °[facility] [place]  [employer] [place] [facility] 1) [address], [place] °2) [address], [place] °3)  [address], [place] (336) 349-3220 °(336) 342-7768 ° °(336) 342-8140   °[place] [facility] (336) 342-1394 or (336) 342-3537 (After Hours)    ° ° ° °Take your usual prescriptions as previously directed.  Call your regular medical doctor tomorrow to schedule a follow up appointment within the next 2 days. Return to the Emergency Department immediately sooner if worsening.  ° °

## 2014-03-31 NOTE — ED Notes (Signed)
Pt reports lower abdominal pain since this am. Pt reports LBM today. Pt denies any n/v/d.

## 2014-03-31 NOTE — ED Notes (Signed)
Patient and family verbalize understanding of discharge instructions, home care and follow up care. Patient out of department at this time with family. 

## 2014-03-31 NOTE — Progress Notes (Signed)
Patients Hbg was 12.5 today. This does not meet the parameters for her to get Aranesp shot today. Will follow up for labs at her next appt.

## 2014-03-31 NOTE — ED Notes (Signed)
MD at bedside. 

## 2014-04-01 ENCOUNTER — Other Ambulatory Visit (HOSPITAL_COMMUNITY): Payer: Self-pay | Admitting: Oncology

## 2014-04-01 DIAGNOSIS — D500 Iron deficiency anemia secondary to blood loss (chronic): Secondary | ICD-10-CM

## 2014-04-01 LAB — FERRITIN: FERRITIN: 90 ng/mL (ref 10–291)

## 2014-04-02 ENCOUNTER — Encounter (HOSPITAL_COMMUNITY): Payer: Self-pay | Admitting: *Deleted

## 2014-04-02 ENCOUNTER — Emergency Department (HOSPITAL_COMMUNITY): Payer: Medicare Other

## 2014-04-02 ENCOUNTER — Other Ambulatory Visit (INDEPENDENT_AMBULATORY_CARE_PROVIDER_SITE_OTHER): Payer: Self-pay | Admitting: *Deleted

## 2014-04-02 ENCOUNTER — Inpatient Hospital Stay (HOSPITAL_COMMUNITY)
Admission: EM | Admit: 2014-04-02 | Discharge: 2014-04-04 | DRG: 065 | Disposition: A | Discharge status: Home / Self Care | Payer: Medicare Other | Attending: Internal Medicine | Admitting: Internal Medicine

## 2014-04-02 ENCOUNTER — Encounter (INDEPENDENT_AMBULATORY_CARE_PROVIDER_SITE_OTHER): Payer: Self-pay | Admitting: *Deleted

## 2014-04-02 ENCOUNTER — Other Ambulatory Visit: Payer: Self-pay

## 2014-04-02 DIAGNOSIS — R4701 Aphasia: Secondary | ICD-10-CM | POA: Diagnosis present

## 2014-04-02 DIAGNOSIS — K754 Autoimmune hepatitis: Secondary | ICD-10-CM

## 2014-04-02 DIAGNOSIS — Z8673 Personal history of transient ischemic attack (TIA), and cerebral infarction without residual deficits: Secondary | ICD-10-CM

## 2014-04-02 DIAGNOSIS — Z79899 Other long term (current) drug therapy: Secondary | ICD-10-CM

## 2014-04-02 DIAGNOSIS — D509 Iron deficiency anemia, unspecified: Secondary | ICD-10-CM | POA: Diagnosis present

## 2014-04-02 DIAGNOSIS — F17210 Nicotine dependence, cigarettes, uncomplicated: Secondary | ICD-10-CM | POA: Diagnosis present

## 2014-04-02 DIAGNOSIS — Q2730 Arteriovenous malformation, site unspecified: Secondary | ICD-10-CM

## 2014-04-02 DIAGNOSIS — W010XXA Fall on same level from slipping, tripping and stumbling without subsequent striking against object, initial encounter: Secondary | ICD-10-CM | POA: Diagnosis present

## 2014-04-02 DIAGNOSIS — D631 Anemia in chronic kidney disease: Secondary | ICD-10-CM | POA: Diagnosis present

## 2014-04-02 DIAGNOSIS — I129 Hypertensive chronic kidney disease with stage 1 through stage 4 chronic kidney disease, or unspecified chronic kidney disease: Secondary | ICD-10-CM | POA: Diagnosis present

## 2014-04-02 DIAGNOSIS — I517 Cardiomegaly: Secondary | ICD-10-CM | POA: Diagnosis present

## 2014-04-02 DIAGNOSIS — M1990 Unspecified osteoarthritis, unspecified site: Secondary | ICD-10-CM | POA: Diagnosis present

## 2014-04-02 DIAGNOSIS — E2740 Unspecified adrenocortical insufficiency: Secondary | ICD-10-CM | POA: Diagnosis present

## 2014-04-02 DIAGNOSIS — Z8249 Family history of ischemic heart disease and other diseases of the circulatory system: Secondary | ICD-10-CM

## 2014-04-02 DIAGNOSIS — Z825 Family history of asthma and other chronic lower respiratory diseases: Secondary | ICD-10-CM

## 2014-04-02 DIAGNOSIS — Z79891 Long term (current) use of opiate analgesic: Secondary | ICD-10-CM

## 2014-04-02 DIAGNOSIS — I4892 Unspecified atrial flutter: Secondary | ICD-10-CM | POA: Diagnosis present

## 2014-04-02 DIAGNOSIS — I480 Paroxysmal atrial fibrillation: Secondary | ICD-10-CM | POA: Diagnosis present

## 2014-04-02 DIAGNOSIS — D693 Immune thrombocytopenic purpura: Secondary | ICD-10-CM | POA: Diagnosis present

## 2014-04-02 DIAGNOSIS — E869 Volume depletion, unspecified: Secondary | ICD-10-CM | POA: Diagnosis present

## 2014-04-02 DIAGNOSIS — G459 Transient cerebral ischemic attack, unspecified: Secondary | ICD-10-CM | POA: Diagnosis present

## 2014-04-02 DIAGNOSIS — M069 Rheumatoid arthritis, unspecified: Secondary | ICD-10-CM | POA: Diagnosis present

## 2014-04-02 DIAGNOSIS — Z7952 Long term (current) use of systemic steroids: Secondary | ICD-10-CM

## 2014-04-02 DIAGNOSIS — Z823 Family history of stroke: Secondary | ICD-10-CM

## 2014-04-02 DIAGNOSIS — Z7951 Long term (current) use of inhaled steroids: Secondary | ICD-10-CM

## 2014-04-02 DIAGNOSIS — J449 Chronic obstructive pulmonary disease, unspecified: Secondary | ICD-10-CM | POA: Diagnosis present

## 2014-04-02 DIAGNOSIS — D61818 Other pancytopenia: Secondary | ICD-10-CM | POA: Diagnosis present

## 2014-04-02 DIAGNOSIS — N183 Chronic kidney disease, stage 3 unspecified: Secondary | ICD-10-CM | POA: Diagnosis present

## 2014-04-02 DIAGNOSIS — I639 Cerebral infarction, unspecified: Principal | ICD-10-CM | POA: Diagnosis present

## 2014-04-02 DIAGNOSIS — I272 Other secondary pulmonary hypertension: Secondary | ICD-10-CM | POA: Diagnosis present

## 2014-04-02 DIAGNOSIS — Z8701 Personal history of pneumonia (recurrent): Secondary | ICD-10-CM

## 2014-04-02 DIAGNOSIS — D696 Thrombocytopenia, unspecified: Secondary | ICD-10-CM | POA: Diagnosis present

## 2014-04-02 DIAGNOSIS — I422 Other hypertrophic cardiomyopathy: Secondary | ICD-10-CM

## 2014-04-02 DIAGNOSIS — I4891 Unspecified atrial fibrillation: Secondary | ICD-10-CM

## 2014-04-02 HISTORY — DX: Cardiomegaly: I51.7

## 2014-04-02 HISTORY — DX: Cerebral infarction, unspecified: I63.9

## 2014-04-02 LAB — URINE CULTURE

## 2014-04-02 NOTE — ED Notes (Signed)
Pt states she slipped and slowly slid down to the floor. Daughter states pt had a moment of slurred speech and left sided weakness. Daughter also states pt had a weaker grip in her left hand. The pt states she had coughed and had phlegm stuck in her throat and that's why she sounded garbled. Pt states she feels like she is shaking and says she thinks it is from her prednisone.

## 2014-04-02 NOTE — ED Provider Notes (Signed)
CSN: [id number]     Arrival date & time 04/02/14  2225 History  This chart was scribed for Loren Racer, MD by Gwenyth Ober, ED Scribe. This patient was seen in room APA02/APA02 and the patient's care was started at 11:23 PM.    Chief Complaint  Patient presents with  . Weakness   The history is provided by the patient. No language interpreter was used.    HPI Comments: Eileen Santana is a 81 y.o. female with a history of A-fib and GI bleeds who presents to the Emergency Department after she slipped and fell onto the floor 2 hours ago. Pt reports that she was making her bed when she slipped on a sock and sat down. Per pt's daughter, pt had slurred speech and left-sided weakness after the fall that resolved within 10 minutes. Pt denies hitting her head or LOC. Pt was seen in the ED 2 days ago for abdominal pain. She does not take anti-coagulants. Pt denies CP, SOB, light-headedness, dizziness, neck pain, nausea, vomiting, diarrhea and any injuries.   Past Medical History  Diagnosis Date  . History of GI bleed     Associated with NSAIDS  . Iron deficiency anemia     Transfusion dependent  . DJD (degenerative joint disease)   . Essential hypertension, benign   . History of colitis   . Ileitis   . Pancytopenia   . Autoimmune hepatitis     With leukocytoclastic vasculitis  . Heart block AV second degree March 2013    a. Cardiology consult note 06/2013: "Question of previous second degree heart block, although review of cardiology notes indicates that this may have been actually blocked PACs when the patient was on verapamil."  . Bronchitis   . Steroid-induced myopathy   . Renal calculus 08/12/2011  . Rheumatoid arthritis(714.0)   . Colon ulcer 04/2010    NSAID related  . Gastric ulcer 04/2010    NSAID related  . Cancer   . UGI bleed 09/20/2011    Focal area of gastritis oozing of blood  . Gastric AVM 09/20/2011  . Candida esophagitis 09/20/2011  . Paroxysmal atrial fibrillation      Not on anticoag 2/2 hx of GIB/AVM  . Pericarditis     2D Echo EF 65%-70%  . CKD (chronic kidney disease), stage III   . Cholestatic hepatitis   . UTI (lower urinary tract infection)     history  . Paroxysmal atrial flutter     Not on anticoag 2/2 hx of GIB/AVM - dx 06/2013.  . Multifocal atrial tachycardia   . Hypertrophic cardiomyopathy     Echo 03/2011: severe LVH suggesting possble infiltrate cardiomyopathy or advanced hypertensive heart disease, increased  echogenicity of the ventricular septum as well as the pericardium, EF >70% with end systolicmid-cavitary obliteration of the ventricle, stage 1 DD, mild MR, small IVC.  Marland Kitchen Hypomagnesemia   . Chronic renal disease, stage 3, moderately decreased glomerular filtration rate between 30-59 mL/min/1.73 square meter 02/03/2014  . Anemia of chronic disease 11/25/2011  . Anemia of chronic renal failure, stage 3 (moderate) 02/03/2014  . Low back pain    Past Surgical History  Procedure Laterality Date  . Colonoscopy  04/2010  . Upper gastrointestinal endoscopy  04/2010  . Esophagogastroduodenoscopy  09/20/2011    Status post APC.  Marland Kitchen Esophagogastroduodenoscopy  09/20/2011    Procedure: ESOPHAGOGASTRODUODENOSCOPY (EGD);  Surgeon: Malissa Hippo, MD;  Location: [facility];  Service: Endoscopy;  Laterality: N/A;  . Cyst removal  hand      Elbow   Family History  Problem Relation Age of Onset  . Stroke Mother   . Hypertension Sister   . Hypertension Brother   . Heart disease Mother 37  . Heart disease Father 95  . COPD Brother   . Arthritis Brother   . Osteoporosis Sister    History  Substance Use Topics  . Smoking status: Current Some Day Smoker -- 0.25 packs/day    Types: Cigarettes  . Smokeless tobacco: Never Used  . Alcohol Use: No   OB History    No data available     Review of Systems  Constitutional: Negative for fever and chills.  HENT: Negative for facial swelling and trouble swallowing.   Respiratory: Negative for  shortness of breath.   Cardiovascular: Negative for chest pain.  Gastrointestinal: Negative for nausea, vomiting and abdominal pain.  Genitourinary: Negative for dysuria and frequency.  Musculoskeletal: Negative for back pain, neck pain and neck stiffness.  Skin: Negative for rash and wound.  Neurological: Positive for speech difficulty and weakness. Negative for dizziness, light-headedness, numbness and headaches.  All other systems reviewed and are negative.     Allergies  Iohexol; Ivp dye; Naprosyn; Red blood cells; Verapamil; Vicodin; and Sulfa antibiotics  Home Medications   Prior to Admission medications   Medication Sig Start Date End Date Taking? Authorizing Provider  acetaminophen (TYLENOL) 325 MG tablet Take 650 mg by mouth every 6 (six) hours as needed. For pain   Yes Historical Provider, MD  azaTHIOprine (IMURAN) 50 MG tablet TAKE 1 AND 1/2 TABLETS BY MOUTH ONCE DAILY 11/18/13  Yes Malissa Hippo, MD  Cholecalciferol (VITAMIN D) 2000 UNITS tablet Take 2,000 Units by mouth daily.   Yes Historical Provider, MD  Coenzyme Q10 200 MG capsule Take 200 mg by mouth daily.   Yes Historical Provider, MD  Darbepoetin Alfa (ARANESP) 500 MCG/ML SOSY injection Inject 500 mcg into the skin every 21 ( twenty-one) days.   Yes Historical Provider, MD  DEXILANT 60 MG capsule TAKE 1 CAPSULE BY MOUTH EVERY OTHER DAY 11/12/13  Yes Malissa Hippo, MD  diclofenac sodium (VOLTAREN) 1 % GEL Apply 1 application topically daily as needed (for pain). Apply to back and legs   Yes Historical Provider, MD  diltiazem (TIAZAC) 360 MG 24 hr capsule Take 1 capsule (360 mg total) by mouth daily. 01/06/14  Yes Hollice Espy, MD  fish oil-omega-3 fatty acids 1000 MG capsule Take 1 g by mouth daily.   Yes Historical Provider, MD  fluticasone (FLONASE) 50 MCG/ACT nasal spray Place 2 sprays into both nostrils 2 (two) times daily. 03/26/14  Yes Historical Provider, MD  folic acid (FOLVITE) 800 MCG tablet Take 800  mcg by mouth daily.    Yes Historical Provider, MD  furosemide (LASIX) 20 MG tablet Take 20 mg by mouth daily as needed. For fluid retention 04/26/11  Yes Malissa Hippo, MD  guaiFENesin-codeine (ROBITUSSIN AC) 100-10 MG/5ML syrup Take 5 mLs by mouth 3 (three) times daily as needed for cough or congestion.   Yes Historical Provider, MD  HYDROcodone-acetaminophen (NORCO/VICODIN) 5-325 MG per tablet Take 0.5 tablets by mouth at bedtime. 02/19/14  Yes Historical Provider, MD  Magnesium 400 MG TABS Take 1 tablet by mouth 2 (two) times daily.   Yes Historical Provider, MD  metoprolol (LOPRESSOR) 50 MG tablet Take 1.5 tablets (75 mg total) by mouth 2 (two) times daily. 01/06/14  Yes Hollice Espy, MD  Multiple  Vitamins-Minerals (MULTIVITAMIN WITH MINERALS) tablet Take 1 tablet by mouth daily.     Yes Historical Provider, MD  potassium chloride SA (K-DUR,KLOR-CON) 20 MEQ tablet TAKE 1 TABLET BY MOUTH ONCE DAILY 01/14/14  Yes Malissa Hippo, MD  predniSONE (DELTASONE) 5 MG tablet Take 7.5 mg by mouth daily. 03/26/14  Yes Historical Provider, MD  sodium chloride 0.9 % SOLN 100 mL with ferumoxytol 510 MG/17ML SOLN 1,020 mg Inject 510 mg into the vein as needed (IF HEMOGLOBIN IS LESS THAN 10.).    Yes Historical Provider, MD  spironolactone (ALDACTONE) 50 MG tablet TAKE 1 TABLET BY MOUTH DAILY 12/11/13  Yes Malissa Hippo, MD  Travoprost, BAK Free, (TRAVATAN) 0.004 % SOLN ophthalmic solution Place 1 drop into both eyes at bedtime. 09/20/11  Yes Elliot Cousin, MD  ursodiol (ACTIGALL) 250 MG tablet TAKE 2 TABLETS BY MOUTH TWICE DAILY Patient taking differently: TAKE 300MG  BY MOUTH TWICE DAILY   Yes , NP  vitamin B-12 (CYANOCOBALAMIN) 1000 MCG tablet Take 500 mcg by mouth daily.    Yes Historical Provider, MD   BP 111/81 mmHg  Pulse 123  Temp(Src) 98.3 F (36.8 C) (Oral)  Resp 20  Ht 5\' 6"  (1.676 m)  Wt 153 lb (69.4 kg)  BMI 24.71 kg/m2  SpO2 95% Physical Exam  Constitutional: She is  oriented to person, place, and time. She appears well-developed and well-nourished. No distress.  HENT:  Head: Normocephalic and atraumatic.  Mouth/Throat: Oropharynx is clear and moist.  Disconjugate gaze, chronic.  Eyes: EOM are normal. Pupils are equal, round, and reactive to light.  Neck: Normal range of motion. Neck supple.  No posterior midline cervical tenderness to palpation.  Cardiovascular:  Tachycardia. Irregularly irregular  Pulmonary/Chest: Effort normal and breath sounds normal. No respiratory distress. She has no wheezes. She has no rales.  Abdominal: Soft. Bowel sounds are normal. She exhibits no distension and no mass. There is no tenderness. There is no rebound and no guarding.  Musculoskeletal: Normal range of motion. She exhibits no edema or tenderness.  No calf swelling or tenderness. No midline thoracic or lumbar tenderness.  Neurological: She is alert and oriented to person, place, and time.  Patient is alert and oriented x3 with clear, goal oriented speech. Patient has 5/5 motor in all extremities. Sensation is intact to light touch. Bilateral finger-to-nose is normal with no signs of dysmetria.   Skin: Skin is warm and dry. No rash noted. No erythema.  Psychiatric: She has a normal mood and affect. Her behavior is normal.  Nursing note and vitals reviewed.   ED Course  Procedures   DIAGNOSTIC STUDIES: Oxygen Saturation is 99% on RA, normal by my interpretation.    COORDINATION OF CARE: 11:31 PM Discussed treatment plan with pt which includes lab work. Pt agreed to plan.   Labs Review Labs Reviewed  CBC WITH DIFFERENTIAL/PLATELET - Abnormal; Notable for the following:    WBC 3.2 (*)    RDW 19.5 (*)    Platelets 79 (*)    Lymphs Abs 0.5 (*)    Monocytes Relative 17 (*)    All other components within normal limits  BASIC METABOLIC PANEL - Abnormal; Notable for the following:    Sodium 133 (*)    Glucose, Bld 137 (*)    BUN 24 (*)    Creatinine, Ser  1.43 (*)    GFR calc non Af Amer 33 (*)    GFR calc Af Amer 39 (*)  All other components within normal limits  TROPONIN I - Abnormal; Notable for the following:    Troponin I 0.04 (*)    All other components within normal limits  PROTIME-INR  URINALYSIS, ROUTINE W REFLEX MICROSCOPIC    Imaging Review Ct Head Wo Contrast  04/02/2014   CLINICAL DATA:  Left-sided weakness.  Slurred speech.  Now resolved.  EXAM: CT HEAD WITHOUT CONTRAST  TECHNIQUE: Contiguous axial images were obtained from the base of the skull through the vertex without intravenous contrast.  COMPARISON:  None.  FINDINGS: No intracranial hemorrhage, mass effect, or midline shift. No hydrocephalus. The basilar cisterns are patent. No evidence of territorial infarct. No intracranial fluid collection. Age related atrophy and mild chronic small vessel ischemia. Dense atherosclerosis noted at the intracranial vasculature at the skull base Calvarium is intact. Included paranasal sinuses and mastoid air cells are well aerated.  IMPRESSION: No acute intracranial abnormality.   Electronically Signed   By: Rubye Oaks M.D.   On: 04/02/2014 23:42     EKG Interpretation   Date/Time:  Wednesday April 02 2014 22:40:46 EDT Ventricular Rate:  109 PR Interval:    QRS Duration: 83 QT Interval:  412 QTC Calculation: 555 R Axis:   -49 Text Interpretation:  Atrial flutter LAD, consider left anterior  fascicular block Artifact No significant change since last tracing  Confirmed by WARD,  DO, KRISTEN ([id number]) on 04/02/2014 10:56:23 PM      MDM   Final diagnoses:  Atrial fibrillation with RVR    I personally performed the services described in this documentation, which was scribed in my presence. The recorded information has been reviewed and is accurate.   Patient with mild increase in creatinine. Continues to have heart rates fluctuating between 110s and 120s. Please to have a normal neurologic exam. CT head without any acute  findings. Will give very low dose of Cardizem and some IV fluids. We'll discuss with hospitalist about admission for possible TIA and A. fib with RVR   Discussed with hospitalist who will see in ED  Loren Racer, MD 04/03/14 [id number]

## 2014-04-02 NOTE — ED Notes (Signed)
EDP at bedside  

## 2014-04-03 ENCOUNTER — Inpatient Hospital Stay (HOSPITAL_COMMUNITY): Payer: Medicare Other

## 2014-04-03 ENCOUNTER — Encounter (HOSPITAL_COMMUNITY): Payer: Self-pay | Admitting: *Deleted

## 2014-04-03 DIAGNOSIS — W010XXA Fall on same level from slipping, tripping and stumbling without subsequent striking against object, initial encounter: Secondary | ICD-10-CM | POA: Diagnosis present

## 2014-04-03 DIAGNOSIS — Z7951 Long term (current) use of inhaled steroids: Secondary | ICD-10-CM | POA: Diagnosis not present

## 2014-04-03 DIAGNOSIS — I4891 Unspecified atrial fibrillation: Secondary | ICD-10-CM | POA: Diagnosis not present

## 2014-04-03 DIAGNOSIS — J449 Chronic obstructive pulmonary disease, unspecified: Secondary | ICD-10-CM | POA: Diagnosis present

## 2014-04-03 DIAGNOSIS — Z823 Family history of stroke: Secondary | ICD-10-CM | POA: Diagnosis not present

## 2014-04-03 DIAGNOSIS — G459 Transient cerebral ischemic attack, unspecified: Secondary | ICD-10-CM | POA: Diagnosis present

## 2014-04-03 DIAGNOSIS — D696 Thrombocytopenia, unspecified: Secondary | ICD-10-CM | POA: Diagnosis not present

## 2014-04-03 DIAGNOSIS — Z79891 Long term (current) use of opiate analgesic: Secondary | ICD-10-CM | POA: Diagnosis not present

## 2014-04-03 DIAGNOSIS — Z825 Family history of asthma and other chronic lower respiratory diseases: Secondary | ICD-10-CM | POA: Diagnosis not present

## 2014-04-03 DIAGNOSIS — I6340 Cerebral infarction due to embolism of unspecified cerebral artery: Secondary | ICD-10-CM | POA: Diagnosis not present

## 2014-04-03 DIAGNOSIS — I4892 Unspecified atrial flutter: Secondary | ICD-10-CM | POA: Diagnosis present

## 2014-04-03 DIAGNOSIS — Z8701 Personal history of pneumonia (recurrent): Secondary | ICD-10-CM | POA: Diagnosis not present

## 2014-04-03 DIAGNOSIS — Z8249 Family history of ischemic heart disease and other diseases of the circulatory system: Secondary | ICD-10-CM | POA: Diagnosis not present

## 2014-04-03 DIAGNOSIS — Q2730 Arteriovenous malformation, site unspecified: Secondary | ICD-10-CM | POA: Diagnosis not present

## 2014-04-03 DIAGNOSIS — I422 Other hypertrophic cardiomyopathy: Secondary | ICD-10-CM | POA: Diagnosis present

## 2014-04-03 DIAGNOSIS — R4701 Aphasia: Secondary | ICD-10-CM | POA: Diagnosis present

## 2014-04-03 DIAGNOSIS — I480 Paroxysmal atrial fibrillation: Secondary | ICD-10-CM | POA: Diagnosis present

## 2014-04-03 DIAGNOSIS — Z79899 Other long term (current) drug therapy: Secondary | ICD-10-CM | POA: Diagnosis not present

## 2014-04-03 DIAGNOSIS — M1990 Unspecified osteoarthritis, unspecified site: Secondary | ICD-10-CM | POA: Diagnosis present

## 2014-04-03 DIAGNOSIS — D509 Iron deficiency anemia, unspecified: Secondary | ICD-10-CM | POA: Diagnosis present

## 2014-04-03 DIAGNOSIS — D693 Immune thrombocytopenic purpura: Secondary | ICD-10-CM | POA: Diagnosis present

## 2014-04-03 DIAGNOSIS — I639 Cerebral infarction, unspecified: Secondary | ICD-10-CM | POA: Diagnosis present

## 2014-04-03 DIAGNOSIS — I129 Hypertensive chronic kidney disease with stage 1 through stage 4 chronic kidney disease, or unspecified chronic kidney disease: Secondary | ICD-10-CM | POA: Diagnosis present

## 2014-04-03 DIAGNOSIS — E2740 Unspecified adrenocortical insufficiency: Secondary | ICD-10-CM | POA: Diagnosis present

## 2014-04-03 DIAGNOSIS — D631 Anemia in chronic kidney disease: Secondary | ICD-10-CM | POA: Diagnosis present

## 2014-04-03 DIAGNOSIS — F17210 Nicotine dependence, cigarettes, uncomplicated: Secondary | ICD-10-CM | POA: Diagnosis present

## 2014-04-03 DIAGNOSIS — Z7952 Long term (current) use of systemic steroids: Secondary | ICD-10-CM | POA: Diagnosis not present

## 2014-04-03 DIAGNOSIS — I272 Other secondary pulmonary hypertension: Secondary | ICD-10-CM | POA: Diagnosis present

## 2014-04-03 DIAGNOSIS — N183 Chronic kidney disease, stage 3 (moderate): Secondary | ICD-10-CM

## 2014-04-03 DIAGNOSIS — K754 Autoimmune hepatitis: Secondary | ICD-10-CM

## 2014-04-03 DIAGNOSIS — E869 Volume depletion, unspecified: Secondary | ICD-10-CM | POA: Diagnosis present

## 2014-04-03 DIAGNOSIS — M069 Rheumatoid arthritis, unspecified: Secondary | ICD-10-CM | POA: Diagnosis present

## 2014-04-03 DIAGNOSIS — Z8673 Personal history of transient ischemic attack (TIA), and cerebral infarction without residual deficits: Secondary | ICD-10-CM | POA: Diagnosis not present

## 2014-04-03 LAB — CBC WITH DIFFERENTIAL/PLATELET
BASOS PCT: 0 % (ref 0–1)
Basophils Absolute: 0 10*3/uL (ref 0.0–0.1)
Eosinophils Absolute: 0 10*3/uL (ref 0.0–0.7)
Eosinophils Relative: 1 % (ref 0–5)
HCT: 37.7 % (ref 36.0–46.0)
Hemoglobin: 13 g/dL (ref 12.0–15.0)
LYMPHS ABS: 0.5 10*3/uL — AB (ref 0.7–4.0)
Lymphocytes Relative: 16 % (ref 12–46)
MCH: 29.3 pg (ref 26.0–34.0)
MCHC: 34.5 g/dL (ref 30.0–36.0)
MCV: 85.1 fL (ref 78.0–100.0)
Monocytes Absolute: 0.5 10*3/uL (ref 0.1–1.0)
Monocytes Relative: 17 % — ABNORMAL HIGH (ref 3–12)
NEUTROS ABS: 2.1 10*3/uL (ref 1.7–7.7)
NEUTROS PCT: 66 % (ref 43–77)
Platelets: 79 10*3/uL — ABNORMAL LOW (ref 150–400)
RBC: 4.43 MIL/uL (ref 3.87–5.11)
RDW: 19.5 % — ABNORMAL HIGH (ref 11.5–15.5)
SMEAR REVIEW: DECREASED
WBC: 3.2 10*3/uL — AB (ref 4.0–10.5)

## 2014-04-03 LAB — URINALYSIS, ROUTINE W REFLEX MICROSCOPIC
BILIRUBIN URINE: NEGATIVE
Glucose, UA: NEGATIVE mg/dL
HGB URINE DIPSTICK: NEGATIVE
KETONES UR: NEGATIVE mg/dL
LEUKOCYTES UA: NEGATIVE
NITRITE: NEGATIVE
Protein, ur: NEGATIVE mg/dL
SPECIFIC GRAVITY, URINE: 1.015 (ref 1.005–1.030)
UROBILINOGEN UA: 0.2 mg/dL (ref 0.0–1.0)
pH: 7 (ref 5.0–8.0)

## 2014-04-03 LAB — PROTIME-INR
INR: 1.15 (ref 0.00–1.49)
PROTHROMBIN TIME: 14.8 s (ref 11.6–15.2)

## 2014-04-03 LAB — TROPONIN I
TROPONIN I: 0.03 ng/mL (ref <0.031)
Troponin I: 0.03 ng/mL (ref <0.031)
Troponin I: 0.04 ng/mL — ABNORMAL HIGH (ref <0.031)

## 2014-04-03 LAB — CBC
HEMATOCRIT: 37.4 % (ref 36.0–46.0)
HEMOGLOBIN: 12.7 g/dL (ref 12.0–15.0)
MCH: 29.1 pg (ref 26.0–34.0)
MCHC: 34 g/dL (ref 30.0–36.0)
MCV: 85.6 fL (ref 78.0–100.0)
Platelets: 68 10*3/uL — ABNORMAL LOW (ref 150–400)
RBC: 4.37 MIL/uL (ref 3.87–5.11)
RDW: 19.3 % — ABNORMAL HIGH (ref 11.5–15.5)
WBC: 3 10*3/uL — AB (ref 4.0–10.5)

## 2014-04-03 LAB — BASIC METABOLIC PANEL
Anion gap: 7 (ref 5–15)
Anion gap: 7 (ref 5–15)
BUN: 22 mg/dL (ref 6–23)
BUN: 24 mg/dL — ABNORMAL HIGH (ref 6–23)
CALCIUM: 10.2 mg/dL (ref 8.4–10.5)
CHLORIDE: 102 mmol/L (ref 96–112)
CHLORIDE: 99 mmol/L (ref 96–112)
CO2: 27 mmol/L (ref 19–32)
CO2: 27 mmol/L (ref 19–32)
CREATININE: 1.17 mg/dL — AB (ref 0.50–1.10)
Calcium: 9.7 mg/dL (ref 8.4–10.5)
Creatinine, Ser: 1.43 mg/dL — ABNORMAL HIGH (ref 0.50–1.10)
GFR calc Af Amer: 39 mL/min — ABNORMAL LOW (ref >90)
GFR calc Af Amer: 49 mL/min — ABNORMAL LOW (ref >90)
GFR calc non Af Amer: 43 mL/min — ABNORMAL LOW (ref >90)
GFR, EST NON AFRICAN AMERICAN: 33 mL/min — AB (ref >90)
GLUCOSE: 113 mg/dL — AB (ref 70–99)
Glucose, Bld: 137 mg/dL — ABNORMAL HIGH (ref 70–99)
POTASSIUM: 4 mmol/L (ref 3.5–5.1)
Potassium: 4.3 mmol/L (ref 3.5–5.1)
SODIUM: 133 mmol/L — AB (ref 135–145)
Sodium: 136 mmol/L (ref 135–145)

## 2014-04-03 LAB — VITAMIN B12: Vitamin B-12: >2000 pg/mL — ABNORMAL HIGH (ref 211–911)

## 2014-04-03 LAB — MRSA PCR SCREENING: MRSA by PCR: NEGATIVE

## 2014-04-03 LAB — TSH: TSH: 1.302 u[IU]/mL (ref 0.350–4.500)

## 2014-04-03 MED ORDER — LIDOCAINE 5 % EX PTCH
1.0000 | MEDICATED_PATCH | CUTANEOUS | Status: DC
  Administered 2014-04-03 – 2014-04-04 (×2): 1 via TRANSDERMAL
  Filled 2014-04-03 (×5): qty 1

## 2014-04-03 MED ORDER — OMEGA-3-ACID ETHYL ESTERS 1 G PO CAPS
1.0000 g | ORAL_CAPSULE | Freq: Every day | ORAL | Status: DC
  Administered 2014-04-03 – 2014-04-04 (×2): 1 g via ORAL
  Filled 2014-04-03 (×2): qty 1

## 2014-04-03 MED ORDER — OMEGA-3 FATTY ACIDS 1000 MG PO CAPS: 1.0000 g | ORAL_CAPSULE | Freq: Every day | ORAL | Status: DC

## 2014-04-03 MED ORDER — COENZYME Q10 200 MG PO CAPS: 200.0000 mg | ORAL_CAPSULE | Freq: Every day | ORAL | Status: DC

## 2014-04-03 MED ORDER — SODIUM CHLORIDE 0.9 % IV SOLN
INTRAVENOUS | Status: DC
  Administered 2014-04-03 (×2): via INTRAVENOUS

## 2014-04-03 MED ORDER — DILTIAZEM HCL ER COATED BEADS 180 MG PO CP24
180.0000 mg | ORAL_CAPSULE | Freq: Two times a day (BID) | ORAL | Status: DC
Start: 2014-04-03 — End: 2014-04-04
  Administered 2014-04-03 – 2014-04-04 (×3): 180 mg via ORAL
  Filled 2014-04-03 (×3): qty 1

## 2014-04-03 MED ORDER — VITAMIN B-12 1000 MCG PO TABS
500.0000 ug | ORAL_TABLET | Freq: Every day | ORAL | Status: DC
  Administered 2014-04-03 – 2014-04-04 (×2): 500 ug via ORAL
  Filled 2014-04-03 (×2): qty 1

## 2014-04-03 MED ORDER — LORAZEPAM 2 MG/ML IJ SOLN
0.2500 mg | Freq: Once | INTRAMUSCULAR | Status: AC
  Administered 2014-04-03: 0.25 mg via INTRAVENOUS
  Filled 2014-04-03: qty 1

## 2014-04-03 MED ORDER — HYDROCORTISONE NA SUCCINATE PF 100 MG IJ SOLR
50.0000 mg | Freq: Three times a day (TID) | INTRAMUSCULAR | Status: AC
  Administered 2014-04-03 (×3): 50 mg via INTRAVENOUS
  Filled 2014-04-03 (×3): qty 2

## 2014-04-03 MED ORDER — FOLIC ACID 1 MG PO TABS
1000.0000 ug | ORAL_TABLET | Freq: Every day | ORAL | Status: DC
  Administered 2014-04-03 – 2014-04-04 (×2): 1 mg via ORAL
  Filled 2014-04-03 (×2): qty 1

## 2014-04-03 MED ORDER — MAGNESIUM OXIDE 400 (241.3 MG) MG PO TABS
400.0000 mg | ORAL_TABLET | Freq: Two times a day (BID) | ORAL | Status: DC
  Administered 2014-04-03 – 2014-04-04 (×3): 400 mg via ORAL
  Filled 2014-04-03 (×3): qty 1

## 2014-04-03 MED ORDER — GADOBENATE DIMEGLUMINE 529 MG/ML IV SOLN
14.0000 mL | Freq: Once | INTRAVENOUS | Status: AC | PRN
  Administered 2014-04-03: 14 mL via INTRAVENOUS

## 2014-04-03 MED ORDER — LATANOPROST 0.005 % OP SOLN
1.0000 [drp] | Freq: Every day | OPHTHALMIC | Status: DC
  Administered 2014-04-03: 1 [drp] via OPHTHALMIC
  Filled 2014-04-03: qty 2.5

## 2014-04-03 MED ORDER — DILTIAZEM HCL 25 MG/5ML IV SOLN
5.0000 mg | Freq: Once | INTRAVENOUS | Status: AC
  Administered 2014-04-03: 5 mg via INTRAVENOUS
  Filled 2014-04-03: qty 5

## 2014-04-03 MED ORDER — PREDNISONE 20 MG PO TABS
20.0000 mg | ORAL_TABLET | Freq: Every day | ORAL | Status: DC
  Administered 2014-04-03 – 2014-04-04 (×2): 20 mg via ORAL
  Filled 2014-04-03 (×2): qty 1

## 2014-04-03 MED ORDER — AZATHIOPRINE 50 MG PO TABS
75.0000 mg | ORAL_TABLET | Freq: Every day | ORAL | Status: DC
Start: 2014-04-03 — End: 2014-04-04
  Administered 2014-04-04: 75 mg via ORAL
  Filled 2014-04-03 (×5): qty 2

## 2014-04-03 MED ORDER — ADULT MULTIVITAMIN W/MINERALS CH
ORAL_TABLET | Freq: Every day | ORAL | Status: DC
  Administered 2014-04-03 – 2014-04-04 (×2): 1 via ORAL
  Filled 2014-04-03 (×2): qty 1

## 2014-04-03 MED ORDER — DEXTROSE 5 % IV SOLN
5.0000 mg/h | INTRAVENOUS | Status: DC
  Administered 2014-04-03: 5 mg/h via INTRAVENOUS
  Filled 2014-04-03: qty 100

## 2014-04-03 MED ORDER — PANTOPRAZOLE SODIUM 40 MG PO TBEC
40.0000 mg | DELAYED_RELEASE_TABLET | Freq: Every day | ORAL | Status: DC
  Administered 2014-04-03 – 2014-04-04 (×2): 40 mg via ORAL
  Filled 2014-04-03 (×2): qty 1

## 2014-04-03 MED ORDER — POTASSIUM CHLORIDE CRYS ER 20 MEQ PO TBCR
20.0000 meq | EXTENDED_RELEASE_TABLET | Freq: Every day | ORAL | Status: DC
  Administered 2014-04-03 – 2014-04-04 (×2): 20 meq via ORAL
  Filled 2014-04-03 (×2): qty 1

## 2014-04-03 MED ORDER — SODIUM CHLORIDE 0.9 % IV BOLUS (SEPSIS)
500.0000 mL | Freq: Once | INTRAVENOUS | Status: AC
  Administered 2014-04-03: 500 mL via INTRAVENOUS

## 2014-04-03 MED ORDER — ASPIRIN 81 MG PO CHEW
162.0000 mg | CHEWABLE_TABLET | Freq: Every day | ORAL | Status: DC
  Administered 2014-04-04: 162 mg via ORAL
  Filled 2014-04-03: qty 2

## 2014-04-03 MED ORDER — LATANOPROST 0.005 % OP SOLN
OPHTHALMIC | Status: AC
  Filled 2014-04-03: qty 2.5

## 2014-04-03 MED ORDER — METOPROLOL TARTRATE 50 MG PO TABS
75.0000 mg | ORAL_TABLET | Freq: Two times a day (BID) | ORAL | Status: DC
  Administered 2014-04-03 – 2014-04-04 (×4): 75 mg via ORAL
  Filled 2014-04-03 (×8): qty 1

## 2014-04-03 MED ORDER — SODIUM CHLORIDE 0.9 % IJ SOLN
3.0000 mL | Freq: Two times a day (BID) | INTRAMUSCULAR | Status: DC
  Administered 2014-04-03: 3 mL via INTRAVENOUS

## 2014-04-03 MED ORDER — ASPIRIN 81 MG PO CHEW
81.0000 mg | CHEWABLE_TABLET | Freq: Once | ORAL | Status: AC
  Administered 2014-04-03: 81 mg via ORAL
  Filled 2014-04-03: qty 1

## 2014-04-03 MED ORDER — MAGNESIUM 400 MG PO TABS: 1.0000 | ORAL_TABLET | Freq: Two times a day (BID) | ORAL | Status: DC

## 2014-04-03 MED ORDER — LIDOCAINE 5 % EX PTCH
MEDICATED_PATCH | CUTANEOUS | Status: AC
  Filled 2014-04-03: qty 1

## 2014-04-03 NOTE — ED Notes (Signed)
Pt. Tearful and upset about admission. Family at bedside. Comfort measures provided.

## 2014-04-03 NOTE — H&P (Signed)
[facility] History and Physical  Eileen Santana MHD:[id number] DOB: May 16, 1930    PCP:   [name], MD   Chief Complaint: Transient aphasia.   HPI: Eileen Santana is an 81 y.o. female with multiple complex medical problems including Paroxysmal atrial flutter and GIB/IDA, not a candidate for anticoagulation, hx of AVB previously been on Verapamil, hx of pancytopenia and autoimmune hepatitis, chronic steroid use, steroid induced myopathy, infiltrative cardiomyopathy, having difficult controlling HR in the past, brought to the ER as she was having 10 minutes of aphasia.  She denied focal weakness, Vx changes, nausea or vomiting.  Evaluation in the ER showed hypotension with SBP 90's, HR was 130 aflutter, and Cr of 1.43, with Hb of 13 grams per dL.   Her EKG showed atrial flutter with RVR.  Her troponin was negative.  She denied CP, SOB, or having any neurological symptoms at this time.  Her CT of the head was negative.   Hopsitalist was asked to admit her for further evaluation and Tx.   Rewiew of Systems:  Constitutional: Negative for malaise, fever and chills. No significant weight loss or weight gain Eyes: Negative for eye pain, redness and discharge, diplopia, visual changes, or flashes of light. ENMT: Negative for ear pain, hoarseness, nasal congestion, sinus pressure and sore throat. No headaches; tinnitus, drooling, or problem swallowing. Cardiovascular: Negative for chest pain, palpitations, diaphoresis, dyspnea and peripheral edema. ; No orthopnea, PND Respiratory: Negative for cough, hemoptysis, wheezing and stridor. No pleuritic chestpain. Gastrointestinal: Negative for nausea, vomiting, diarrhea, constipation, abdominal pain, melena, blood in stool, hematemesis, jaundice and rectal bleeding.    Genitourinary: Negative for frequency, dysuria, incontinence,flank pain and hematuria; Musculoskeletal: Negative for back pain and neck pain. Negative for swelling and trauma.;  Skin: .  Negative for pruritus, rash, abrasions, bruising and skin lesion.; ulcerations Neuro: Negative for headache, lightheadedness and neck stiffness. Negative for weakness, altered level of consciousness , altered mental status, extremity weakness, burning feet, involuntary movement, seizure and syncope.  Psych: negative for anxiety, depression, insomnia, tearfulness, panic attacks, hallucinations, paranoia, suicidal or homicidal ideation    Past Medical History  Diagnosis Date  . History of GI bleed     Associated with NSAIDS  . Iron deficiency anemia     Transfusion dependent  . DJD (degenerative joint disease)   . Essential hypertension, benign   . History of colitis   . Ileitis   . Pancytopenia   . Autoimmune hepatitis     With leukocytoclastic vasculitis  . Heart block AV second degree March 2013    a. Cardiology consult note 06/2013: "Question of previous second degree heart block, although review of cardiology notes indicates that this may have been actually blocked PACs when the patient was on verapamil."  . Bronchitis   . Steroid-induced myopathy   . Renal calculus 08/12/2011  . Rheumatoid arthritis(714.0)   . Colon ulcer 04/2010    NSAID related  . Gastric ulcer 04/2010    NSAID related  . Cancer   . UGI bleed 09/20/2011    Focal area of gastritis oozing of blood  . Gastric AVM 09/20/2011  . Candida esophagitis 09/20/2011  . Paroxysmal atrial fibrillation     Not on anticoag 2/2 hx of GIB/AVM  . Pericarditis     2D Echo EF 65%-70%  . CKD (chronic kidney disease), stage III   . Cholestatic hepatitis   . UTI (lower urinary tract infection)     history  . Paroxysmal atrial flutter  Not on anticoag 2/2 hx of GIB/AVM - dx 06/2013.  . Multifocal atrial tachycardia   . Hypertrophic cardiomyopathy     Echo 03/2011: severe LVH suggesting possble infiltrate cardiomyopathy or advanced hypertensive heart disease, increased  echogenicity of the ventricular septum as well as the  pericardium, EF >70% with end systolicmid-cavitary obliteration of the ventricle, stage 1 DD, mild MR, small IVC.  Marland Kitchen Hypomagnesemia   . Chronic renal disease, stage 3, moderately decreased glomerular filtration rate between 30-59 mL/min/1.73 square meter 02/03/2014  . Anemia of chronic disease 11/25/2011  . Anemia of chronic renal failure, stage 3 (moderate) 02/03/2014  . Low back pain     Past Surgical History  Procedure Laterality Date  . Colonoscopy  04/2010  . Upper gastrointestinal endoscopy  04/2010  . Esophagogastroduodenoscopy  09/20/2011    Status post APC.  Marland Kitchen Esophagogastroduodenoscopy  09/20/2011    Procedure: ESOPHAGOGASTRODUODENOSCOPY (EGD);  Surgeon: Malissa Hippo, MD;  Location: [facility];  Service: Endoscopy;  Laterality: N/A;  . Cyst removal hand      Elbow    Medications:  HOME MEDS: Prior to Admission medications   Medication Sig Start Date End Date Taking? Authorizing Provider  acetaminophen (TYLENOL) 325 MG tablet Take 650 mg by mouth every 6 (six) hours as needed. For pain   Yes Historical Provider, MD  azaTHIOprine (IMURAN) 50 MG tablet TAKE 1 AND 1/2 TABLETS BY MOUTH ONCE DAILY 11/18/13  Yes Malissa Hippo, MD  Cholecalciferol (VITAMIN D) 2000 UNITS tablet Take 2,000 Units by mouth daily.   Yes Historical Provider, MD  Coenzyme Q10 200 MG capsule Take 200 mg by mouth daily.   Yes Historical Provider, MD  Darbepoetin Alfa (ARANESP) 500 MCG/ML SOSY injection Inject 500 mcg into the skin every 21 ( twenty-one) days.   Yes Historical Provider, MD  DEXILANT 60 MG capsule TAKE 1 CAPSULE BY MOUTH EVERY OTHER DAY 11/12/13  Yes Malissa Hippo, MD  diclofenac sodium (VOLTAREN) 1 % GEL Apply 1 application topically daily as needed (for pain). Apply to back and legs   Yes Historical Provider, MD  diltiazem (TIAZAC) 360 MG 24 hr capsule Take 1 capsule (360 mg total) by mouth daily. 01/06/14  Yes Hollice Espy, MD  fish oil-omega-3 fatty acids 1000 MG capsule Take 1 g by  mouth daily.   Yes Historical Provider, MD  fluticasone (FLONASE) 50 MCG/ACT nasal spray Place 2 sprays into both nostrils 2 (two) times daily. 03/26/14  Yes Historical Provider, MD  folic acid (FOLVITE) 800 MCG tablet Take 800 mcg by mouth daily.    Yes Historical Provider, MD  furosemide (LASIX) 20 MG tablet Take 20 mg by mouth daily as needed. For fluid retention 04/26/11  Yes Malissa Hippo, MD  guaiFENesin-codeine (ROBITUSSIN AC) 100-10 MG/5ML syrup Take 5 mLs by mouth 3 (three) times daily as needed for cough or congestion.   Yes Historical Provider, MD  HYDROcodone-acetaminophen (NORCO/VICODIN) 5-325 MG per tablet Take 0.5 tablets by mouth at bedtime. 02/19/14  Yes Historical Provider, MD  Magnesium 400 MG TABS Take 1 tablet by mouth 2 (two) times daily.   Yes Historical Provider, MD  metoprolol (LOPRESSOR) 50 MG tablet Take 1.5 tablets (75 mg total) by mouth 2 (two) times daily. 01/06/14  Yes Hollice Espy, MD  Multiple Vitamins-Minerals (MULTIVITAMIN WITH MINERALS) tablet Take 1 tablet by mouth daily.     Yes Historical Provider, MD  potassium chloride SA (K-DUR,KLOR-CON) 20 MEQ tablet TAKE 1 TABLET BY  MOUTH ONCE DAILY 01/14/14  Yes Malissa Hippo, MD  predniSONE (DELTASONE) 5 MG tablet Take 7.5 mg by mouth daily. 03/26/14  Yes Historical Provider, MD  sodium chloride 0.9 % SOLN 100 mL with ferumoxytol 510 MG/17ML SOLN 1,020 mg Inject 510 mg into the vein as needed (IF HEMOGLOBIN IS LESS THAN 10.).    Yes Historical Provider, MD  spironolactone (ALDACTONE) 50 MG tablet TAKE 1 TABLET BY MOUTH DAILY 12/11/13  Yes Malissa Hippo, MD  Travoprost, BAK Free, (TRAVATAN) 0.004 % SOLN ophthalmic solution Place 1 drop into both eyes at bedtime. 09/20/11  Yes Elliot Cousin, MD  ursodiol (ACTIGALL) 250 MG tablet TAKE 2 TABLETS BY MOUTH TWICE DAILY Patient taking differently: TAKE 300MG  BY MOUTH TWICE DAILY   Yes Len Blalock, NP  vitamin B-12 (CYANOCOBALAMIN) 1000 MCG tablet Take 500 mcg by mouth daily.     Yes Historical Provider, MD     Allergies:  Allergies  Allergen Reactions  . Iohexol Swelling    IV Dye   . Ivp Dye [Iodinated Diagnostic Agents] Swelling    Hives  . Naprosyn [Naproxen] Hives and Itching  . Red Blood Cells Swelling and Dermatitis    With blood transfusion 2012  . Verapamil     Heart block (2nd degree) Takes Cardizem   . Vicodin [Hydrocodone-Acetaminophen] Hives and Itching  . Sulfa Antibiotics Itching and Rash    Social History:   reports that she has been smoking Cigarettes.  She has been smoking about 0.25 packs per day. She has never used smokeless tobacco. She reports that she does not drink alcohol or use illicit drugs.  Family History: Family History  Problem Relation Age of Onset  . Stroke Mother   . Hypertension Sister   . Hypertension Brother   . Heart disease Mother 81  . Heart disease Father 95  . COPD Brother   . Arthritis Brother   . Osteoporosis Sister      Physical Exam: Filed Vitals:   04/03/14 0100 04/03/14 0106 04/03/14 0122 04/03/14 0130  BP:  95/73 107/93 111/81  Pulse: 124 117 124 123  Temp:  98.3 F (36.8 C)    TempSrc:  Oral    Resp: 18 21 18 20   Height:      Weight:      SpO2: 96% 95% 97% 95%   Blood pressure 111/81, pulse 123, temperature 98.3 F (36.8 C), temperature source Oral, resp. rate 20, height 5\' 6"  (1.676 m), weight 69.4 kg (153 lb), SpO2 95 %.  GEN:  Pleasant patient lying in the stretcher in no acute distress; cooperative with exam. PSYCH:  alert and oriented x4; does not appear anxious or depressed; affect is appropriate. HEENT: Mucous membranes pink and anicteric; PERRLA; EOM intact; no cervical lymphadenopathy nor thyromegaly or carotid bruit; no JVD; There were no stridor. Neck is very supple. Breasts:: Not examined CHEST WALL: No tenderness CHEST: Normal respiration, clear to auscultation bilaterally.  HEART: Irregular rate and rhythm.  There is a 2/6 SEM with no rub, or gallops.   BACK: No  kyphosis or scoliosis; no CVA tenderness ABDOMEN: soft and non-tender; no masses, no organomegaly, normal abdominal bowel sounds; no pannus; no intertriginous candida. There is no rebound and no distention. Rectal Exam: Not done EXTREMITIES: No bone or joint deformity; age-appropriate arthropathy of the hands and knees; no edema; no ulcerations.  There is no calf tenderness. Genitalia: not examined PULSES: 2+ and symmetric SKIN: Normal hydration no rash or ulceration CNS:  Cranial nerves 2-12 grossly intact no focal lateralizing neurologic deficit.  Speech is fluent; uvula elevated with phonation, facial symmetry and tongue midline. DTR are normal bilaterally, cerebella exam is intact, barbinski is negative and strengths are equaled bilaterally.  No sensory loss.   Labs on Admission:  Basic Metabolic Panel:  Recent Labs Lab 03/31/14 1851 04/02/14 2340  NA 133* 133*  K 4.2 4.3  CL 100 99  CO2 25 27  GLUCOSE 143* 137*  BUN 20 24*  CREATININE 1.11* 1.43*  CALCIUM 10.2 10.2   Liver Function Tests:  Recent Labs Lab 03/31/14 1851  AST 76*  ALT 17  ALKPHOS 57  BILITOT 1.0  PROT 6.7  ALBUMIN 2.7*    Recent Labs Lab 03/31/14 1851  LIPASE 43   No results for input(s): AMMONIA in the last 168 hours. CBC:  Recent Labs Lab 03/31/14 1130 03/31/14 1851 04/02/14 2340  WBC 3.9* 5.7 3.2*  NEUTROABS 3.0 4.8 2.1  HGB 12.5 13.2 13.0  HCT 37.2 38.2 37.7  MCV 85.7 85.3 85.1  PLT 77* 72* 79*   Cardiac Enzymes:  Recent Labs Lab 04/02/14 2340  TROPONINI 0.04*    CBG: No results for input(s): GLUCAP in the last 168 hours.   Radiological Exams on Admission: Ct Head Wo Contrast  04/02/2014   CLINICAL DATA:  Left-sided weakness.  Slurred speech.  Now resolved.  EXAM: CT HEAD WITHOUT CONTRAST  TECHNIQUE: Contiguous axial images were obtained from the base of the skull through the vertex without intravenous contrast.  COMPARISON:  None.  FINDINGS: No intracranial hemorrhage,  mass effect, or midline shift. No hydrocephalus. The basilar cisterns are patent. No evidence of territorial infarct. No intracranial fluid collection. Age related atrophy and mild chronic small vessel ischemia. Dense atherosclerosis noted at the intracranial vasculature at the skull base Calvarium is intact. Included paranasal sinuses and mastoid air cells are well aerated.  IMPRESSION: No acute intracranial abnormality.   Electronically Signed   By: Rubye Oaks M.D.   On: 04/02/2014 23:42    EKG: Independently reviewed.    Assessment/Plan Present on Admission:  . TIA (transient ischemic attack):  Given her GI bleed and infusion dependent anemia, she is not a candidate for anticoagulation nor is it safe for antiplatelet therapy.  Her BP was too low, and that could be given her TIA symptoms.  WIll obtain ECHO and carotid US, though Tx is limited for her. . Atrial flutter with rapid ventricular response:  Her BP has come up, so will start her on low dose Diltiazem tonight.  WIll continue her Betablocker at this time.  She will be admitted into SDU.  Again, not a candidate for anticoagulation.   . Autoimmune hepatitis:  Continue monitoring.  Her LFTs were not significantly elevated.   Marland Kitchen COPD (chronic obstructive pulmonary disease):  Will continue with her meds.   . Rheumatoid arthritis:  She will be given IV Solucortef giving chronic steroid use in the setting of hypotension.  WIll increase her Prednisone dose temporary to  per day.   . Chronic renal disease, stage 3, moderately decreased glomerular filtration rate between 30-59 mL/min/1.73 square meter.  . Adrenal insufficiency:  Again, tapering her steroid will be very difficult.  At the moment, she will need IV stress dose steroid, and will increase her daily dose to  per day.    Volume depletion:  She has a slight volume depletion.  Will give gentle IV fluid.  Hold her diuretics for now.  Other plans as per orders.  Code  Status: FULL Unk Lightning, MD. [facility] Pager [phone] 7pm to 7am.  04/03/2014, 1:53 AM

## 2014-04-03 NOTE — Progress Notes (Signed)
The patient is in a 81-year-old woman with a history of PAF/flutter-off of anticoagulation secondary to upper GI bleed; autoimmune thrombocytopenia; hypertrophic cardiomyopathy; and steroid-dependent autoimmune hepatitis; who was admitted this morning by Dr. Nedra Hai for transient aphasia. The patient was briefly seen and examined. Her chart, vital signs, laboratory studies were reviewed. Agree with current workup, with additions below.  On exam, the patient is alert and oriented 3. Her speech is clear with no evidence of dysarthria or aphasia. Her heart rate has improved.  -We'll order MRI of her brain for completion of workup. -Echocardiogram and carotid ultrasound have been ordered and are pending. -We'll wean off diltiazem drip and restart oral diltiazem. -Troponin I is marginally elevated, likely secondary to RVR. The patient denies chest pain. We'll continue to cycle troponin I. -We will check a TSH and vitamin B12 level. -Consult PT.

## 2014-04-03 NOTE — ED Notes (Signed)
Dr Le at bedside.  

## 2014-04-03 NOTE — ED Notes (Signed)
Pt. Daughter refusing pt. To have urinalysis done at this time. Dr. Ranae Palms notified.

## 2014-04-03 NOTE — Progress Notes (Signed)
PT Cancellation Note  Patient Details Name: Eileen Santana MRN: [id number] DOB: 04/07/1930   Cancelled Treatment:    Reason Eval/Treat Not Completed: Patient at procedure or test/unavailable.  Pt is having an MRI.   [name] L 04/03/2014, 12:55 PM

## 2014-04-03 NOTE — Care Management Utilization Note (Signed)
UR completed 

## 2014-04-03 NOTE — Progress Notes (Signed)
  Echocardiogram 2D Echocardiogram has been performed.  Stacey Drain 04/03/2014, 1:42 PM

## 2014-04-03 NOTE — Progress Notes (Signed)
PHARMACIST - PHYSICIAN ORDER COMMUNICATION  CONCERNING: [employer] Medication Policy on Herbal Medications  DESCRIPTION:  This patient's order for:  Co-enzyme Q10 has been noted.  This product(s) is classified as an "herbal" or natural product. Due to a lack of definitive safety studies or FDA approval, nonstandard manufacturing practices, plus the potential risk of unknown drug-drug interactions while on inpatient medications, the Pharmacy and Therapeutics Committee does not permit the use of "herbal" or natural products of this type within [place].   ACTION TAKEN: The pharmacy department is unable to verify this order at this time and your patient has been informed of this safety policy. Please reevaluate patient's clinical condition at discharge and address if the herbal or natural product(s) should be resumed at that time.   

## 2014-04-04 ENCOUNTER — Encounter (HOSPITAL_COMMUNITY): Payer: Self-pay | Admitting: Internal Medicine

## 2014-04-04 ENCOUNTER — Telehealth (HOSPITAL_COMMUNITY): Payer: Self-pay | Admitting: Oncology

## 2014-04-04 ENCOUNTER — Other Ambulatory Visit (HOSPITAL_COMMUNITY): Payer: Self-pay | Admitting: Oncology

## 2014-04-04 DIAGNOSIS — I4891 Unspecified atrial fibrillation: Secondary | ICD-10-CM

## 2014-04-04 DIAGNOSIS — D638 Anemia in other chronic diseases classified elsewhere: Secondary | ICD-10-CM

## 2014-04-04 DIAGNOSIS — I639 Cerebral infarction, unspecified: Secondary | ICD-10-CM

## 2014-04-04 DIAGNOSIS — I6340 Cerebral infarction due to embolism of unspecified cerebral artery: Secondary | ICD-10-CM

## 2014-04-04 DIAGNOSIS — D696 Thrombocytopenia, unspecified: Secondary | ICD-10-CM

## 2014-04-04 DIAGNOSIS — I517 Cardiomegaly: Secondary | ICD-10-CM

## 2014-04-04 HISTORY — DX: Cardiomegaly: I51.7

## 2014-04-04 HISTORY — DX: Cerebral infarction, unspecified: I63.9

## 2014-04-04 LAB — CBC
HCT: 36.5 % (ref 36.0–46.0)
Hemoglobin: 12.5 g/dL (ref 12.0–15.0)
MCH: 29.1 pg (ref 26.0–34.0)
MCHC: 34.2 g/dL (ref 30.0–36.0)
MCV: 84.9 fL (ref 78.0–100.0)
Platelets: 78 10*3/uL — ABNORMAL LOW (ref 150–400)
RBC: 4.3 MIL/uL (ref 3.87–5.11)
RDW: 19.5 % — ABNORMAL HIGH (ref 11.5–15.5)
WBC: 4.8 10*3/uL (ref 4.0–10.5)

## 2014-04-04 LAB — BASIC METABOLIC PANEL
Anion gap: 6 (ref 5–15)
BUN: 21 mg/dL (ref 6–23)
CO2: 25 mmol/L (ref 19–32)
CREATININE: 1.04 mg/dL (ref 0.50–1.10)
Calcium: 9.3 mg/dL (ref 8.4–10.5)
Chloride: 107 mmol/L (ref 96–112)
GFR calc Af Amer: 57 mL/min — ABNORMAL LOW (ref >90)
GFR calc non Af Amer: 49 mL/min — ABNORMAL LOW (ref >90)
Glucose, Bld: 123 mg/dL — ABNORMAL HIGH (ref 70–99)
Potassium: 4.2 mmol/L (ref 3.5–5.1)
Sodium: 138 mmol/L (ref 135–145)

## 2014-04-04 MED ORDER — ASPIRIN 81 MG PO CHEW: 162.0000 mg | CHEWABLE_TABLET | Freq: Every day | ORAL | Status: DC

## 2014-04-04 MED ORDER — METOPROLOL TARTRATE 50 MG PO TABS: ORAL_TABLET | ORAL | Status: DC

## 2014-04-04 NOTE — Discharge Summary (Signed)
Physician Discharge Summary  Briena Swingler JXB:[id number] DOB: [birth year] DOA: 04/02/2014  PCP: [name], MD  Admit date: 04/02/2014 Discharge date: 04/04/2014  Time spent: Greater than 30 minutes  Recommendations for Outpatient Follow-up:  1. Recommend follow-up CBC to assess the patient's hemoglobin and platelet count with the start of antiplatelet therapy. 2. Recommend follow-up of the patient's blood pressure and consideration for further adjusting antihypertensive medications in light of recent stroke.  3.   Discharge Diagnoses:  1. Expressive aphasia, likely secondary to acute punctate strokes. Resolved for now. 2. Left punctate cerebral infarctions, likely embolic. 3. Hypertension with low-low blood pressures during hospitalization. 4. Chronic atrial flutter/atrial fibrillation with RVR. 5. Marginally elevated troponin I, secondary to rapid ventricular rate. 6. Severe biatrial enlargement; ejection fraction 55-60% per 2-D echocardiogram; mild pulmonary hypertension-40 mmHg. 7. Chronic autoimmune thrombocytopenia. 8. Autoimmune hepatitis, on chronic prednisone. 9. Stage III chronic kidney disease. Remained stable. 10. History of severe GI bleeding secondary to gastric AVMs. 11. Rheumatoid arthritis. Remained stable.  Discharge Condition: Improved.  Diet recommendation: Heart healthy.  Filed Weights   04/03/14 0259 04/03/14 0300 04/04/14 0500  Weight: 68.1 kg (150 lb 2.1 oz) 68.1 kg (150 lb 2.1 oz) 69.2 kg (152 lb 8.9 oz)    History of present illness:  The patient is an 81 year old woman with a history of paroxysmal atrial fibrillation/atrial flutter, recurrent upper GI bleeding secondary to AVMs and gastric ulcer-off of anticoagulation secondary to GI bleeding; chronic immune thrombocytopenia, chronic autoimmune hepatitis-on chronic steroid therapy with prednisone, and rheumatoid arthritis, who presented to the emergency department on 04/02/2014 with a chief complaint of  difficulty speaking as per history reported by her family. In the ED, she was afebrile and tachycardic with heart rate from 112-125 bpm. Her blood pressure was in the 90s to 120s systolically. Her lab data were significant for a troponin I of 0.04, creatinine of 1.43, W BC 3.2, platelet count of 79, and normal INR of 1.15. CT of her head revealed no acute intracranial abnormality. Her EKG revealed atrial flutter with a heart rate of 109 bpm. She was admitted for further evaluation and management.  Hospital Course:   1. Acute left brain punctate strokes. The patient's aphasia resolved at the time of the initial hospitalization, and therefore, it was felt that she had a TIA. Given her history of severe upper GI bleeding, there was hesitation about starting anticoagulation or antiplatelet therapy. Therefore it was initially withheld. For further evaluation, a number studies were ordered. MRI of her brain revealed punctate acute infarction affecting the left parietal gyrus and possible punctate subacute infarction affecting a different left parietal gyrus. Carotid ultrasound revealed moderate plaque and low resistance waveform of the left ICA but no significant ICA stenosis. 2-D echocardiogram revealed an ejection fraction of 55-60% and severely dilated right and left atria. I discussed starting antiplatelet therapy with the patient, her daughter, and hematology PA Tom. We were all in agreement that it would be reasonable to start a trial of antiplatelet therapy in this patient who has a history of severe upper GI bleeding. We felt that it would not be prudent to start anticoagulation-neither the patient nor her daughter wanted this because of the high risk of recurrent bleeding. Therefore, she was started on 162 mg aspirin daily. Because of her chronic thrombocytopenia and iron deficiency anemia her platelet count and hemoglobin/hematocrit will need to be monitored on a regular basis. She was continued on her  standing dose of PPI. Of note, statin therapy  was not ordered because of her history of autoimmune hepatitis/elevated LFTs. She was continued on fish oil/medical 3 fatty acids. Physical therapy was consulted and recommended that the patient use her walker with ambulation, but no home health physical therapy was needed.  Chronic atrial flutter with RVR. The patient was briefly started on a diltiazem drip. This was discontinued shortly after it was started. She was subsequently restarted on oral diltiazem, but at a dosing of 180 mg twice a day rather than 360 mg daily due to her soft blood pressures. Metoprolol was continued. Her rate became controlled and remained below 100 bpm during the hospital course. Her rhythm remained in atrial flutter.  Elevated troponin I. The patient troponin I was marginally elevated on admission, but quickly normalized. The elevation was thought to be secondary to RVR. The patient had no complaint of chest pain.  Essential hypertension. The patient's blood pressures were low-normal initially. She was started on gentle IV fluids. She was given stress doses of hydrocortisone. Holding parameters were ordered for diltiazem and metoprolol. In the setting of the acute punctate strokes, she was instructed to decrease the dose of metoprolol to 50 mg twice a day for 2 days and then restart it at 75 mg twice a day. She was also instructed to resume diltiazem at 360 mg daily. She will follow up with cardiology nurse practitioner Mrs. Lawrence in 1 week for further evaluation.  Chronic thrombocytopenia and iron deficiency anemia. The patient is followed by hematology for treatment of iron deficiency anemia. She receives IV iron per hematology. Her chronic thrombocytopenia is thought to be secondary to the sequelae of autoimmune hepatitis. She is treated chronically with prednisone as managed by Dr. Karilyn Cota. Her platelet count ranged from 68-79 and her hemoglobin ranged from 12.5-12.7  during hospitalization. As mentioned above, aspirin was started at 162 mg daily. She will need a follow-up CBC in 1 week. This was discussed with hematology who will order a CBC next week at her appointment for IV iron transfusion.    Procedures:  2-D echocardiogram- Study Conclusions - Left ventricle: The cavity size was normal. Wall thickness was increased in a pattern of mild LVH. Systolic function was normal. The estimated ejection fraction was in the range of 55% to 60%. Wall motion was normal; there were no regional wall motion abnormalities. - Aortic valve: Mildly calcified annulus. Trileaflet; mildly thickened leaflets. Valve area (VTI): 2.64 cm^2. Valve area (Vmax): 2.21 cm^2. - Mitral valve: Moderately to severely calcified annulus. There was mild regurgitation. - Left atrium: The atrium was severely dilated. - Right atrium: The atrium was severely dilated. - Tricuspid valve: There was moderate regurgitation. - Pulmonary arteries: Systolic pressure was moderately increased. PA peak pressure: 40 mm Hg (S). - Technically adequate study.  Consultations:  None  Discharge Exam: Filed Vitals:   04/04/14 1100  BP: 114/66  Pulse: 83  Temp:   Resp: 13    General: Pleasant alert elderly 81 year old African-American woman sitting up in bed, in no acute distress. Cardiovascular: S1, S2, with a soft systolic murmur and occasional ectopic beat. Respiratory: Clear to auscultation bilaterally. Neurologic/psychiatric: She is alert and oriented 3. Cranial nerves II through XII are grossly intact. No evidence of dysphagia or aphasia. Good bilateral hand grip. She is able to raise each leg against gravity 45.  Discharge Instructions   Discharge Instructions    Diet - low sodium heart healthy    Complete by:  As directed      Discharge instructions  Complete by:  As directed   Avoid strenuous activities for 1 week. You will need to have your blood  count-hemoglobin and platelet count-rechecked in 1-2 weeks.     Increase activity slowly    Complete by:  As directed           Current Discharge Medication List    START taking these medications   Details  aspirin 81 MG chewable tablet Chew 2 tablets (162 mg total) by mouth daily.      CONTINUE these medications which have CHANGED   Details  metoprolol (LOPRESSOR) 50 MG tablet Take 1 tablet 2 times daily on Saturday and Sunday and resume one and a half tablets 2 times daily on Monday 04/06/14.      CONTINUE these medications which have NOT CHANGED   Details  acetaminophen (TYLENOL) 325 MG tablet Take 650 mg by mouth every 6 (six) hours as needed. For pain    azaTHIOprine (IMURAN) 50 MG tablet TAKE 1 AND 1/2 TABLETS BY MOUTH ONCE DAILY Qty: 60 tablet, Refills: 5    Cholecalciferol (VITAMIN D) 2000 UNITS tablet Take 2,000 Units by mouth daily.    Coenzyme Q10 200 MG capsule Take 200 mg by mouth daily.    Darbepoetin Alfa (ARANESP) 500 MCG/ML SOSY injection Inject 500 mcg into the skin every 21 ( twenty-one) days.    DEXILANT 60 MG capsule TAKE 1 CAPSULE BY MOUTH EVERY OTHER DAY Qty: 45 capsule, Refills: 4    diclofenac sodium (VOLTAREN) 1 % GEL Apply 1 application topically daily as needed (for pain). Apply to back and legs    diltiazem (TIAZAC) 360 MG 24 hr capsule Take 1 capsule (360 mg total) by mouth daily. Qty: 30 capsule, Refills: 1    fish oil-omega-3 fatty acids 1000 MG capsule Take 1 g by mouth daily.   Associated Diagnoses: Pancytopenia; Elevated liver enzymes    fluticasone (FLONASE) 50 MCG/ACT nasal spray Place 2 sprays into both nostrils 2 (two) times daily.    folic acid (FOLVITE) 800 MCG tablet Take 800 mcg by mouth daily.    Associated Diagnoses: Pancytopenia    furosemide (LASIX) 20 MG tablet Take 20 mg by mouth daily as needed. For fluid retention   Associated Diagnoses: Anemia; Elevated liver enzymes    HYDROcodone-acetaminophen (NORCO/VICODIN) 5-325  MG per tablet Take 0.5 tablets by mouth at bedtime.    Magnesium 400 MG TABS Take 1 tablet by mouth 2 (two) times daily.    Multiple Vitamins-Minerals (MULTIVITAMIN WITH MINERALS) tablet Take 1 tablet by mouth daily.     Associated Diagnoses: Pancytopenia    potassium chloride SA (K-DUR,KLOR-CON) 20 MEQ tablet TAKE 1 TABLET BY MOUTH ONCE DAILY Qty: 30 tablet, Refills: 5    predniSONE (DELTASONE) 5 MG tablet Take 7.5 mg by mouth daily.    sodium chloride 0.9 % SOLN 100 mL with ferumoxytol 510 MG/17ML SOLN 1,020 mg Inject 510 mg into the vein as needed (IF HEMOGLOBIN IS LESS THAN 10.).     spironolactone (ALDACTONE) 50 MG tablet TAKE 1 TABLET BY MOUTH DAILY Qty: 90 tablet, Refills: 1    Travoprost, BAK Free, (TRAVATAN) 0.004 % SOLN ophthalmic solution Place 1 drop into both eyes at bedtime.    ursodiol (ACTIGALL) 250 MG tablet TAKE 2 TABLETS BY MOUTH TWICE DAILY Qty: 120 tablet, Refills: 4    vitamin B-12 (CYANOCOBALAMIN) 1000 MCG tablet Take 500 mcg by mouth daily.       STOP taking these medications  guaiFENesin-codeine (ROBITUSSIN AC) 100-10 MG/5ML syrup        Allergies  Allergen Reactions  . Iohexol Swelling    IV Dye   . Ivp Dye [Iodinated Diagnostic Agents] Swelling    Hives  . Naprosyn [Naproxen] Hives and Itching  . Red Blood Cells Swelling and Dermatitis    With blood transfusion 2012  . Verapamil     Heart block (2nd degree) Takes Cardizem   . Vicodin [Hydrocodone-Acetaminophen] Hives and Itching  . Sulfa Antibiotics Itching and Rash   Follow-up Information    Follow up with [facility]   Why:  Follow-up as scheduled.   Contact information:   [address] [place] [address]       Follow up with Joni Reining, NP On 04/11/2014.   Specialty:  Nurse Practitioner   Why:  Cardiology nurse practitioner at 2:30 PM.   Contact information:   [address] [place] [address] [phone]        The results  of significant diagnostics from this hospitalization (including imaging, microbiology, ancillary and laboratory) are listed below for reference.    Significant Diagnostic Studies: Ct Abdomen Pelvis Wo Contrast  03/31/2014   CLINICAL DATA:  Patient c/o lower abd pain since this am/ hx HTN, colitis, GI bleed, hepatitis, renal stones, colonic and gastric ulcers and CKDPatient is allergic to IV contrast  EXAM: CT ABDOMEN AND PELVIS WITHOUT CONTRAST  TECHNIQUE: Multidetector CT imaging of the abdomen and pelvis was performed following the standard protocol without IV contrast.  COMPARISON:  08/10/2011.  FINDINGS: Mild cardiomegaly. Mild lung base interstitial thickening similar to the prior study. No pleural effusion.  Liver and spleen are unremarkable. No evidence of acute cholecystitis. Pancreas is unremarkable. No bile duct dilation. Calcified nodes adjacent to the pancreas and along the gastrohepatic ligament are stable. No adrenal masses.  Low-density renal masses, largest arising from the midpole of the left kidney measuring 4.2 cm. These are similar to the prior study consistent with cysts. No hydronephrosis. Normal ureters. Bladder is minimally distended but otherwise unremarkable.  Uterus and adnexa are unremarkable.  Dense atherosclerotic calcifications noted along a normal caliber abdominal aorta and branch vessels.  There are no pathologically enlarged lymph nodes.  No ascites.  There are scattered colonic diverticula. No diverticulitis. Colon otherwise unremarkable. Normal small bowel. Normal appendix.  There are degenerative changes of the lumbar spine. Bones are diffusely demineralized.  IMPRESSION: 1. No acute findings. 2. No evidence of colitis/diverticulitis. No bowel inflammatory changes. 3. Normal appendix visualized. 4. Cardiomegaly with interstitial thickening at the lung bases. Interstitial thickening may be chronic, and is similar to the prior study. Consider mild congestive heart failure  interstitial edema if the patient short of breath. 5. Chronic findings include atherosclerotic calcifications of the aorta, calcified nodes in right upper abdomen, presumed renal cysts and degenerative changes of the lumbar spine.   Electronically Signed   By: Amie Portland M.D.   On: 03/31/2014 21:50   Dg Chest 2 View  03/27/2014   CLINICAL DATA:  One week history of cough, clinical diagnosis of bronchitis, history of pneumonia in 2015, occasional tobacco use  EXAM: CHEST  2 VIEW  COMPARISON:  PA and lateral chest x-ray dated January 03, 2014  FINDINGS: The lungs remain hyperinflated with hemidiaphragm flattening. There is no focal infiltrate. Coarse infrahilar lung markings are stable. The cardiac silhouette remains enlarged. The pulmonary vascularity is normal. There is no pleural effusion. The bony thorax  is unremarkable.  IMPRESSION: COPD and cardiomegaly, stable. There is no active cardiopulmonary disease.   Electronically Signed   By: David  [place]   On: 03/27/2014 09:56   Dg Thoracic Spine W/swimmers  03/27/2014   CLINICAL DATA:  Mid back and low back pain for the past 3 months  EXAM: THORACIC SPINE - 2 VIEW + SWIMMERS  COMPARISON:  PA and lateral chest x-ray of January 03, 2014  FINDINGS: The thoracic vertebral bodies are preserved in height. The disc space heights are mildly narrowed from C4 through C7 which is a chronic finding. The pedicles are intact. There are no abnormal paravertebral soft tissue densities.  IMPRESSION: There is no compression fracture. There is degenerative disc space narrowing of the mid thoracic spine.   Electronically Signed   By: David  [place]   On: 03/27/2014 09:58   Dg Lumbar Spine Complete  03/27/2014   CLINICAL DATA:  Right-sided low back pain radiating into both legs for the preceding 3 months  EXAM: LUMBAR SPINE - COMPLETE 4+ VIEW  COMPARISON:  Lumbar spine series of September 18, 2011  FINDINGS: The lumbar vertebral bodies are preserved in height. There is mild to  moderate disc space narrowing at L4-5 and L5-S1 which is stable. There is no spondylolisthesis. There is facet joint hypertrophy bilaterally at L4-5 and L5-S1. The pedicles and transverse processes are intact. The observed portions of the sacrum are normal. There is stable rounded calcifications within the visualized portions of the abdomen some of which are related to the pancreas seen on the CT scan of August 2013.  IMPRESSION: There is moderate stable degenerative disc and facet joint change in the lower lumbar spine. There is no compression fracture nor other acute bony abnormality. Lumbar spine MRI may be useful if the patient's symptoms persist.   Electronically Signed   By: David  [place]   On: 03/27/2014 10:00   Ct Head Wo Contrast  04/02/2014   CLINICAL DATA:  Left-sided weakness.  Slurred speech.  Now resolved.  EXAM: CT HEAD WITHOUT CONTRAST  TECHNIQUE: Contiguous axial images were obtained from the base of the skull through the vertex without intravenous contrast.  COMPARISON:  None.  FINDINGS: No intracranial hemorrhage, mass effect, or midline shift. No hydrocephalus. The basilar cisterns are patent. No evidence of territorial infarct. No intracranial fluid collection. Age related atrophy and mild chronic small vessel ischemia. Dense atherosclerosis noted at the intracranial vasculature at the skull base Calvarium is intact. Included paranasal sinuses and mastoid air cells are well aerated.  IMPRESSION: No acute intracranial abnormality.   Electronically Signed   By: Rubye Oaks M.D.   On: 04/02/2014 23:42   Mr Laqueta Jean VV Contrast  04/03/2014   CLINICAL DATA:  Left-sided weakness.  Slurred speech.  EXAM: MRI HEAD WITHOUT AND WITH CONTRAST  TECHNIQUE: Multiplanar, multiecho pulse sequences of the brain and surrounding structures were obtained without and with intravenous contrast.  CONTRAST:  73mL MULTIHANCE GADOBENATE DIMEGLUMINE 529 MG/ML IV SOLN  COMPARISON:  CT 04/02/2014  FINDINGS:  Diffusion imaging shows a punctate acute infarction affecting a left parietal gyrus. There is a possible punctate subacute infarction in a different left parietal gyrus. No recent insult is seen affecting the right hemisphere.  The brainstem and cerebellum are normal. There are mild chronic small-vessel ischemic changes affecting the cerebral hemispheric white matter. No large vessel territory infarction. No mass lesion, hemorrhage, hydrocephalus or extra-axial collection. After contrast administration, no abnormal enhancement occurs. No pituitary mass. No inflammatory  sinus disease. No skull or skullbase lesion.  IMPRESSION: Punctate acute infarction affecting a left parietal gyrus. Possible punctate subacute infarction affecting a different left parietal gyrus. Findings are consistent with micro emboli in the left carotid system.   Electronically Signed   By: Paulina Fusi M.D.   On: 04/03/2014 13:27   US Carotid Bilateral  04/03/2014   CLINICAL DATA:  81 year old female with a history of TIA, aphasia, slurred speech.  Cardiovascular risk factors include hypertension, prior stroke/ TIA, tobacco use  EXAM: BILATERAL CAROTID DUPLEX ULTRASOUND  TECHNIQUE: Wallace Cullens scale imaging, color Doppler and duplex ultrasound were performed of bilateral carotid and vertebral arteries in the neck.  COMPARISON:  None  FINDINGS: Criteria: Quantification of carotid stenosis is based on velocity parameters that correlate the residual internal carotid diameter with NASCET-based stenosis levels, using the diameter of the distal internal carotid lumen as the denominator for stenosis measurement.  The following velocity measurements were obtained:  RIGHT  ICA:  Systolic 60 cm/sec, Diastolic 18 cm/sec  CCA:  50 cm/sec  SYSTOLIC ICA/CCA RATIO:  1.2  ECA:  52 cm/sec  LEFT  ICA:  Systolic 50 set cm/sec, Diastolic 15 cm/sec  CCA:  68 cm/sec  SYSTOLIC ICA/CCA RATIO:  0.8  ECA:  41 cm/sec  Right Brachial SBP: Not acquired  Left Brachial SBP: Not  acquired  RIGHT CAROTID ARTERY: No significant atherosclerotic disease of the common carotid artery. Intermediate waveform maintained. Heterogeneous and partially calcified plaque at the right carotid bifurcation. Low resistance waveform of the right ICA. Mild tortuosity.  RIGHT VERTEBRAL ARTERY: Antegrade flow with low resistance waveform.  LEFT CAROTID ARTERY: Atherosclerotic changes of the left common carotid artery with intermediate waveform maintained. Heterogeneous and calcified plaque at the left carotid bifurcation. Low resistance waveform of the left ICA.  LEFT VERTEBRAL ARTERY:  Antegrade flow with low resistance waveform.  IMPRESSION: Color duplex indicates moderate heterogeneous and calcified plaque, with no hemodynamically significant stenosis by duplex criteria in the extracranial cerebrovascular circulation.  Signed,  Yvone Neu. Loreta Ave, DO  [facility]  [facility]   Electronically Signed   By: Gilmer Mor D.O.   On: 04/03/2014 10:19    Microbiology: Recent Results (from the past 240 hour(s))  Urine culture     Status: None   Collection Time: 03/31/14  6:52 PM  Result Value Ref Range Status   Specimen Description URINE, CLEAN CATCH  Final   Special Requests NONE  Final   Colony Count   Final    55,000 COLONIES/ML Performed at [facility]    Culture   Final    LACTOBACILLUS SPECIES Note: Standardized susceptibility testing for this organism is not available. Performed at [facility]    Report Status 04/02/2014 FINAL  Final  MRSA PCR Screening     Status: None   Collection Time: 04/03/14  2:46 AM  Result Value Ref Range Status   MRSA by PCR NEGATIVE NEGATIVE Final    Comment:        The GeneXpert MRSA Assay (FDA approved for NASAL specimens only), is one component of a comprehensive MRSA colonization surveillance program. It is not intended to diagnose MRSA infection nor to guide or monitor treatment  for MRSA infections.      Labs: Basic Metabolic Panel:  Recent Labs Lab 03/31/14 1851 04/02/14 2340 04/03/14 0434 04/04/14 0839  NA 133* 133* 136 138  K 4.2 4.3 4.0 4.2  CL 100 99 102 107  CO2 25  27 27 25   GLUCOSE 143* 137* 113* 123*  BUN 20 24* 22 21  CREATININE 1.11* 1.43* 1.17* 1.04  CALCIUM 10.2 10.2 9.7 9.3   Liver Function Tests:  Recent Labs Lab 03/31/14 1851  AST 76*  ALT 17  ALKPHOS 57  BILITOT 1.0  PROT 6.7  ALBUMIN 2.7*    Recent Labs Lab 03/31/14 1851  LIPASE 43   No results for input(s): AMMONIA in the last 168 hours. CBC:  Recent Labs Lab 03/31/14 1130 03/31/14 1851 04/02/14 2340 04/03/14 0434 04/04/14 0839  WBC 3.9* 5.7 3.2* 3.0* 4.8  NEUTROABS 3.0 4.8 2.1  --   --   HGB 12.5 13.2 13.0 12.7 12.5  HCT 37.2 38.2 37.7 37.4 36.5  MCV 85.7 85.3 85.1 85.6 84.9  PLT 77* 72* 79* 68* 78*   Cardiac Enzymes:  Recent Labs Lab 04/02/14 2340 04/03/14 0918 04/03/14 1457  TROPONINI 0.04* 0.03 0.03   BNP: BNP (last 3 results)  Recent Labs  01/03/14 1016  BNP 383.0*    ProBNP (last 3 results)  Recent Labs  06/11/13 1339  PROBNP 1496.0*    CBG: No results for input(s): GLUCAP in the last 168 hours.     Signed:  Lachrista Heslin  [facility] 04/04/2014, 11:16 AM

## 2014-04-04 NOTE — Progress Notes (Addendum)
Pt's daughter called after pt was discharged requesting that MD fax an order for 180mg  capsule of cardizem BID to pt's pharmacy as pt only has 360mg  capsules at home. I spoke to MD and she stated that pt should remain on the 360mg  capsules until she sees cardiology on 04/11/14. MD stated that the pt's metoprolol had been decreased in the hospital to prevent pt's HR from dropping too low at night. Attempted to call daughter back, but she never picked up and no voicemail picked up so that I could leave a message. Charge nurse has been notified. If pt's daughter does call back she can inform daughter of MD's orders.   1535: Got in touch with pt's daughter. She is aware of MD's instructions on how to give medications to pt.

## 2014-04-04 NOTE — Evaluation (Signed)
Physical Therapy Evaluation Patient Details Name: Eileen Santana MRN: [id number] DOB: 05/15/1930 Today's Date: 04/04/2014   History of Present Illness  Eileen Santana is an 81 y.o. female with multiple complex medical problems including Paroxysmal atrial flutter and GIB/IDA, not a candidate for anticoagulation, hx of AVB previously been on Verapamil, hx of pancytopenia and autoimmune hepatitis, chronic steroid use, steroid induced myopathy, infiltrative cardiomyopathy, having difficult controlling HR in the past, brought to the ER as she was having 10 minutes of aphasia. She denied focal weakness, Vx changes, nausea or vomiting. Evaluation in the ER showed hypotension with SBP 90's, HR was 130 aflutter, and Cr of 1.43, with Hb of 13 grams per dL. Her EKG showed atrial flutter with RVR. Her troponin was negative. She denied CP, SOB, or having any neurological symptoms at this time. Her CT of the head was negative.  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 Pt lives with family who is available as needed.  She normally ambulates with a cane outside of the home but does use her walker for gait at times "to keep me strong".  She is active in the [employer] and actually had a [employer] game scheduled for today.  Clinical Impression   Pt was seen for evaluation.  She is alert, oriented and very cooperative.  No speech deficits noted.  Her functional strength is WNL but standing balance is mildly decreased due to recent bedrest.  She was able to ambulate 150' with a cane and no loss of balance but I would recommend that she use her walker for a while in order to maximize home stafety.  I believe that she will return to her full baseline with no PT intervention.    Follow Up Recommendations No PT follow up    Equipment Recommendations  None recommended by PT    Recommendations for Other Services    none   Precautions / Restrictions Precautions Precautions:  Fall Restrictions Weight Bearing Restrictions: No      Mobility  Bed Mobility Overal bed mobility: Modified Independent                Transfers Overall transfer level: Modified independent Equipment used: None                Ambulation/Gait Ambulation/Gait assistance: Supervision Ambulation Distance (Feet): 150 Feet Assistive device: Straight cane Gait Pattern/deviations: Decreased stance time - right Gait velocity: appropriate for situation Gait velocity interpretation: Below normal speed for age/gender General Gait Details: pt appears to have antalgia on the right but she denies this...she states that she has "bad knees" and favors the right side  Stairs            Wheelchair Mobility    Modified Rankin (Stroke Patients Only)       Balance Overall balance assessment: Needs assistance Sitting-balance support: No upper extremity supported;Feet supported Sitting balance-Leahy Scale: Normal     Standing balance support: No upper extremity supported;During functional activity Standing balance-Leahy Scale: Fair Standing balance comment: pt has been in bed for several days and her mild balance deficit is related to this rather than neurologically based                             Pertinent Vitals/Pain Pain Assessment: No/denies pain    Home Living Family/patient expects to be discharged to:: Private residence Living Arrangements: Children Available Help at Discharge: Family;Available 24 hours/day Type of  Home: House       Home Layout: Two level;Able to live on main level with bedroom/bathroom Home Equipment: Dan Humphreys - 2 wheels;Cane - quad;Cane - single point;Bedside commode      Prior Function Level of Independence: Independent with assistive device(s)         Comments: uses assistive devices as needed...none, cane or walker     Hand Dominance   Dominant Hand: Right    Extremity/Trunk Assessment   Upper Extremity  Assessment: Defer to OT evaluation           Lower Extremity Assessment: Overall WFL for tasks assessed         Communication   Communication: No difficulties  Cognition Arousal/Alertness: Awake/alert Behavior During Therapy: WFL for tasks assessed/performed Overall Cognitive Status: Within Functional Limits for tasks assessed                      General Comments      Exercises        Assessment/Plan    PT Assessment Patent does not need any further PT services  PT Diagnosis     PT Problem List    PT Treatment Interventions     PT Goals (Current goals can be found in the Care Plan section) Acute Rehab PT Goals PT Goal Formulation: All assessment and education complete, DC therapy    Frequency     Barriers to discharge  none      Co-evaluation               End of Session Equipment Utilized During Treatment: Gait belt Activity Tolerance: Patient tolerated treatment well Patient left: in chair;with call bell/phone within reach Nurse Communication: Mobility status         Time: 0822-0855 PT Time Calculation (min) (ACUTE ONLY): 33 min   Charges:   PT Evaluation $Initial PT Evaluation Tier I: 1 Procedure     PT G CodesMyrlene Broker L 04/04/2014, 9:06 AM

## 2014-04-04 NOTE — Care Management Note (Signed)
    Page 1 of 1   04/04/2014     10:37:52 AM CARE MANAGEMENT NOTE 04/04/2014  Patient:  Eileen, Santana   Account Number:  [id number]  Date Initiated:  04/04/2014  Documentation initiated by:  Kathyrn Sheriff  Subjective/Objective Assessment:   Pt is from home, lives with sister and independent at baseline. Pt has cane/walker for PRN use. Pt has no HH services. Pt plans to discharge home with self care. No PT follow up recommended. No CM needs.     Action/Plan:   Anticipated DC Date:  04/04/2014   Anticipated DC Plan:  HOME/SELF CARE      DC Planning Services  CM consult      Choice offered to / List presented to:             Status of service:  Completed, signed off Medicare Important Message given?  YES (If response is "NO", the following Medicare IM given date fields will be blank) Date Medicare IM given:  04/04/2014 Medicare IM given by:  Kathyrn Sheriff Date Additional Medicare IM given:   Additional Medicare IM given by:    Discharge Disposition:  HOME/SELF CARE  Per UR Regulation:  Reviewed for med. necessity/level of care/duration of stay  If discussed at Long Length of Stay Meetings, dates discussed:    Comments:  04/04/2014 1030 Kathyrn Sheriff, RN, MSN, CM.

## 2014-04-04 NOTE — Progress Notes (Signed)
Pt is to be discharged home today. Pt is in NAD, IV is out, all paperwork has been reviewed/discussed with patient, and there are no questions/concerns at this time. Assessment is unchanged from this morning. Pt is to be accompanied downstairs by staff and family via wheelchair.  

## 2014-04-10 ENCOUNTER — Encounter (HOSPITAL_COMMUNITY): Payer: Medicare Other | Attending: Oncology

## 2014-04-10 ENCOUNTER — Encounter (HOSPITAL_COMMUNITY): Payer: Medicare Other

## 2014-04-10 VITALS — BP 115/68 | HR 90 | Temp 98.3°F | Resp 16

## 2014-04-10 DIAGNOSIS — D61818 Other pancytopenia: Secondary | ICD-10-CM | POA: Diagnosis present

## 2014-04-10 DIAGNOSIS — D649 Anemia, unspecified: Secondary | ICD-10-CM | POA: Insufficient documentation

## 2014-04-10 DIAGNOSIS — D638 Anemia in other chronic diseases classified elsewhere: Secondary | ICD-10-CM

## 2014-04-10 DIAGNOSIS — D509 Iron deficiency anemia, unspecified: Secondary | ICD-10-CM | POA: Diagnosis present

## 2014-04-10 DIAGNOSIS — D500 Iron deficiency anemia secondary to blood loss (chronic): Secondary | ICD-10-CM | POA: Diagnosis not present

## 2014-04-10 DIAGNOSIS — N289 Disorder of kidney and ureter, unspecified: Secondary | ICD-10-CM | POA: Insufficient documentation

## 2014-04-10 LAB — CBC WITH DIFFERENTIAL/PLATELET
BASOS ABS: 0 10*3/uL (ref 0.0–0.1)
BASOS PCT: 1 % (ref 0–1)
EOS ABS: 0.1 10*3/uL (ref 0.0–0.7)
EOS PCT: 2 % (ref 0–5)
HEMATOCRIT: 34.7 % — AB (ref 36.0–46.0)
HEMOGLOBIN: 11.7 g/dL — AB (ref 12.0–15.0)
Lymphocytes Relative: 8 % — ABNORMAL LOW (ref 12–46)
Lymphs Abs: 0.3 10*3/uL — ABNORMAL LOW (ref 0.7–4.0)
MCH: 28.7 pg (ref 26.0–34.0)
MCHC: 33.7 g/dL (ref 30.0–36.0)
MCV: 85.3 fL (ref 78.0–100.0)
MONO ABS: 0.4 10*3/uL (ref 0.1–1.0)
Monocytes Relative: 9 % (ref 3–12)
Neutro Abs: 3.3 10*3/uL (ref 1.7–7.7)
Neutrophils Relative %: 81 % — ABNORMAL HIGH (ref 43–77)
Platelets: 94 10*3/uL — ABNORMAL LOW (ref 150–400)
RBC: 4.07 MIL/uL (ref 3.87–5.11)
RDW: 19.2 % — ABNORMAL HIGH (ref 11.5–15.5)
WBC: 4 10*3/uL (ref 4.0–10.5)

## 2014-04-10 MED ORDER — SODIUM CHLORIDE 0.9 % IJ SOLN
10.0000 mL | Freq: Once | INTRAMUSCULAR | Status: AC
  Administered 2014-04-10: 10 mL via INTRAVENOUS

## 2014-04-10 MED ORDER — SODIUM CHLORIDE 0.9 % IV SOLN
Freq: Once | INTRAVENOUS | Status: AC
Start: 2014-04-10 — End: 2014-04-10
  Administered 2014-04-10: 15:00:00 via INTRAVENOUS

## 2014-04-10 MED ORDER — SODIUM CHLORIDE 0.9 % IV SOLN
250.0000 mg | Freq: Once | INTRAVENOUS | Status: AC
  Administered 2014-04-10: 250 mg via INTRAVENOUS
  Filled 2014-04-10: qty 20

## 2014-04-11 ENCOUNTER — Encounter: Payer: Self-pay | Admitting: Adult Health

## 2014-04-11 ENCOUNTER — Ambulatory Visit (INDEPENDENT_AMBULATORY_CARE_PROVIDER_SITE_OTHER): Payer: Medicare Other | Admitting: Adult Health

## 2014-04-11 VITALS — BP 112/58 | HR 462 | Ht 66.0 in | Wt 152.0 lb

## 2014-04-11 DIAGNOSIS — I639 Cerebral infarction, unspecified: Secondary | ICD-10-CM | POA: Diagnosis not present

## 2014-04-11 DIAGNOSIS — I10 Essential (primary) hypertension: Secondary | ICD-10-CM

## 2014-04-11 DIAGNOSIS — I482 Chronic atrial fibrillation, unspecified: Secondary | ICD-10-CM

## 2014-04-11 NOTE — Progress Notes (Deleted)
Name: Eileen Santana    DOB: 10-05-1930  Age: 81 y.o.  MR#: [id number]       PCP:  [name], MD      Insurance: Payor: MEDICARE / Plan: MEDICARE PART A AND B / Product Type: *No Product type* /   CC:    Chief Complaint  Patient presents with  . Hypertension  . Cerebrovascular Accident    VS Filed Vitals:   04/11/14 1434  BP: 112/58  Pulse: 462  Height:  (1.676 m)  Weight: 152 lb (68.947 kg)  SpO2: 99%    Weights Current Weight  04/11/14 152 lb (68.947 kg)  04/04/14 152 lb 8.9 oz (69.2 kg)  04/03/14 150 lb (68.04 kg)    Blood Pressure  BP Readings from Last 3 Encounters:  04/11/14 112/58  04/10/14 115/68  04/04/14 114/66     Admit date:  (Not on file) Last encounter with RMR:  12/12/2013   Allergy Iohexol; Ivp dye; Naprosyn; Red blood cells; Verapamil; Vicodin; and Sulfa antibiotics  Current Outpatient Prescriptions  Medication Sig Dispense Refill  . acetaminophen (TYLENOL) 325 MG tablet Take 650 mg by mouth every 6 (six) hours as needed. For pain    . aspirin 81 MG chewable tablet Chew 2 tablets (162 mg total) by mouth daily.    Marland Kitchen azaTHIOprine (IMURAN) 50 MG tablet TAKE 1 AND 1/2 TABLETS BY MOUTH ONCE DAILY 60 tablet 5  . Cholecalciferol (VITAMIN D) 2000 UNITS tablet Take 2,000 Units by mouth daily.    . Coenzyme Q10 200 MG capsule Take 200 mg by mouth daily.    . Darbepoetin Alfa (ARANESP) 500 MCG/ML SOSY injection Inject 500 mcg into the skin every 21 ( twenty-one) days.    Marland Kitchen DEXILANT 60 MG capsule TAKE 1 CAPSULE BY MOUTH EVERY OTHER DAY 45 capsule 4  . diclofenac sodium (VOLTAREN) 1 % GEL Apply 1 application topically daily as needed (for pain). Apply to back and legs    . diltiazem (TIAZAC) 360 MG 24 hr capsule Take 1 capsule (360 mg total) by mouth daily. 30 capsule 1  . fish oil-omega-3 fatty acids 1000 MG capsule Take 1 g by mouth daily.    . fluticasone (FLONASE) 50 MCG/ACT nasal spray Place 2 sprays into both nostrils 2 (two) times daily.    .  folic acid (FOLVITE) 800 MCG tablet Take 800 mcg by mouth daily.     . furosemide (LASIX) 20 MG tablet Take 20 mg by mouth daily as needed. For fluid retention    . Magnesium 400 MG TABS Take 1 tablet by mouth 2 (two) times daily.    . metoprolol (LOPRESSOR) 50 MG tablet Take 1 tablet 2 times daily on Saturday and Sunday and resume one and a half tablets 2 times daily on Monday 04/06/14. (Patient taking differently: Take 50 mg by mouth 2 (two) times daily. Take 1 tablet 2 times daily on Saturday and Sunday and resume one and a half tablets 2 times daily on Monday 04/06/14.)    . Multiple Vitamins-Minerals (MULTIVITAMIN WITH MINERALS) tablet Take 1 tablet by mouth daily.      . potassium chloride SA (K-DUR,KLOR-CON) 20 MEQ tablet TAKE 1 TABLET BY MOUTH ONCE DAILY 30 tablet 5  . predniSONE (DELTASONE) 5 MG tablet Take 7.5 mg by mouth daily.    Marland Kitchen spironolactone (ALDACTONE) 50 MG tablet TAKE 1 TABLET BY MOUTH DAILY 90 tablet 1  . Travoprost, BAK Free, (TRAVATAN) 0.004 % SOLN ophthalmic solution Place 1 drop  into both eyes at bedtime.    . vitamin B-12 (CYANOCOBALAMIN) 1000 MCG tablet Take 500 mcg by mouth daily.     . ursodiol (ACTIGALL) 250 MG tablet TAKE 2 TABLETS BY MOUTH TWICE DAILY (Patient taking differently: TAKE 300MG  BY MOUTH TWICE DAILY) 120 tablet 4   No current facility-administered medications for this visit.   Facility-Administered Medications Ordered in Other Visits  Medication Dose Route Frequency Provider Last Rate Last Dose  . 0.9 %  sodium chloride infusion   Intravenous Continuous Ellouise Newer, PA-C   Stopped at 10/04/13 1230  . diphenhydrAMINE (BENADRYL) capsule 25 mg  25 mg Oral Q6H PRN Glenford Peers, MD   25 mg at 05/13/11 0932    Discontinued Meds:    Medications Discontinued During This Encounter  Medication Reason  . HYDROcodone-acetaminophen (NORCO/VICODIN) 5-325 MG per tablet Error  . sodium chloride 0.9 % SOLN 100 mL with ferumoxytol 510 MG/17ML SOLN 1,020 mg Error     Patient Active Problem List   Diagnosis Date Noted  . 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 Biatrial enlargement 04/04/2014  . TIA (transient ischemic attack) 04/03/2014  . Adrenal insufficiency 04/03/2014  . Aphasia 04/03/2014  . Chronic renal disease, stage 3, moderately decreased glomerular filtration rate between 30-59 mL/min/1.73 square meter 02/03/2014  . Anemia of chronic renal failure, stage 3 (moderate) 02/03/2014  . Atrial flutter with rapid ventricular response 01/02/2014  . Atrial fibrillation with rapid ventricular response   . Atrial fibrillation with RVR 12/29/2013  . Healthcare-associated pneumonia 12/29/2013  . COPD (chronic obstructive pulmonary disease) 12/29/2013  . HCAP (healthcare-associated pneumonia) 12/29/2013  . Atrial flutter 06/12/2013  . Atrial tachycardia 06/11/2013  . AVM (congenital arteriovenous malformation) 10/12/2012  . Hypersplenism 09/21/2012  . Iron deficiency anemia due to chronic blood loss 09/21/2012  . PAT (paroxysmal atrial tachycardia) 05/23/2012  . Hypertrophic cardiomyopathy 05/23/2012  . Anemia of chronic disease 11/25/2011  . UGI bleed 09/20/2011  . Gastric AVM 09/20/2011  . Candida esophagitis 09/20/2011  . Chronic back pain 09/18/2011  . Leg pain 09/18/2011  . Rheumatoid arthritis 09/18/2011  . Acute renal insufficiency 09/18/2011  . Hypoalbuminemia 09/18/2011  . Guaiac positive stools 09/18/2011  . Renal calculus 08/12/2011  . Abdominal pain, acute, generalized 08/10/2011  . Nausea 08/10/2011  . Diarrhea 08/10/2011  . Hypomagnesemia 08/10/2011  . Hypokalemia 08/10/2011  . Prolonged Q-T interval on ECG 08/10/2011  . UTI (urinary tract infection) 08/10/2011  . Gallstones 08/10/2011  . Thrombocytopenia 08/10/2011  . Orthostatic hypotension 03/05/2011  . Bradycardia 03/05/2011  . Renal failure 03/05/2011  . Autoimmune hepatitis 03/05/2011  . HTN (hypertension), benign 03/05/2011  . Elevated liver enzymes 02/17/2011   . GI bleed 02/17/2011  . Microcytic anemia 02/17/2011  . Other pancytopenia 09/03/2010  . Sedimentation rate elevation 09/03/2010    LABS    Component Value Date/Time   NA 138 04/04/2014 0839   NA 136 04/03/2014 0434   NA 133* 04/02/2014 2340   K 4.2 04/04/2014 0839   K 4.0 04/03/2014 0434   K 4.3 04/02/2014 2340   CL 107 04/04/2014 0839   CL 102 04/03/2014 0434   CL 99 04/02/2014 2340   CO2 25 04/04/2014 0839   CO2 27 04/03/2014 0434   CO2 27 04/02/2014 2340   GLUCOSE 123* 04/04/2014 0839   GLUCOSE 113* 04/03/2014 0434   GLUCOSE 137* 04/02/2014 2340   BUN 21 04/04/2014 0839   BUN 22 04/03/2014 0434   BUN 24* 04/02/2014 2340   CREATININE  1.04 04/04/2014 0839   CREATININE 1.17* 04/03/2014 0434   CREATININE 1.43* 04/02/2014 2340   CREATININE 1.37* 03/18/2014 1026   CREATININE 1.04 08/12/2013 1042   CREATININE 0.99 06/24/2013 1020   CALCIUM 9.3 04/04/2014 0839   CALCIUM 9.7 04/03/2014 0434   CALCIUM 10.2 04/02/2014 2340   GFRNONAA 49* 04/04/2014 0839   GFRNONAA 43* 04/03/2014 0434   GFRNONAA 33* 04/02/2014 2340   GFRAA 57* 04/04/2014 0839   GFRAA 49* 04/03/2014 0434   GFRAA 39* 04/02/2014 2340   CMP     Component Value Date/Time   NA 138 04/04/2014 0839   K 4.2 04/04/2014 0839   CL 107 04/04/2014 0839   CO2 25 04/04/2014 0839   GLUCOSE 123* 04/04/2014 0839   BUN 21 04/04/2014 0839   CREATININE 1.04 04/04/2014 0839   CREATININE 1.37* 03/18/2014 1026   CALCIUM 9.3 04/04/2014 0839   PROT 6.7 03/31/2014 1851   ALBUMIN 2.7* 03/31/2014 1851   AST 76* 03/31/2014 1851   ALT 17 03/31/2014 1851   ALKPHOS 57 03/31/2014 1851   BILITOT 1.0 03/31/2014 1851   GFRNONAA 49* 04/04/2014 0839   GFRAA 57* 04/04/2014 0839       Component Value Date/Time   WBC 4.0 04/10/2014 1430   WBC 4.8 04/04/2014 0839   WBC 3.0* 04/03/2014 0434   HGB 11.7* 04/10/2014 1430   HGB 12.5 04/04/2014 0839   HGB 12.7 04/03/2014 0434   HCT 34.7* 04/10/2014 1430   HCT 36.5 04/04/2014 0839    HCT 37.4 04/03/2014 0434   MCV 85.3 04/10/2014 1430   MCV 84.9 04/04/2014 0839   MCV 85.6 04/03/2014 0434    Lipid Panel     Component Value Date/Time   CHOL 196 03/06/2011 0410   TRIG 41 03/06/2011 0410   HDL 68 03/06/2011 0410   CHOLHDL 2.9 03/06/2011 0410   VLDL 8 03/06/2011 0410   LDLCALC 120* 03/06/2011 0410    ABG    Component Value Date/Time   TCO2 26 11/28/2009 2359     Lab Results  Component Value Date   TSH 1.302 04/02/2014   BNP (last 3 results)  Recent Labs  01/03/14 1016  BNP 383.0*    ProBNP (last 3 results)  Recent Labs  06/11/13 1339  PROBNP 1496.0*    Cardiac Panel (last 3 results) No results for input(s): CKTOTAL, CKMB, TROPONINI, RELINDX in the last 72 hours.  Iron/TIBC/Ferritin/ %Sat    Component Value Date/Time   IRON 25* 09/24/2013 1117   TIBC 325 09/24/2013 1117   FERRITIN 90 03/31/2014 1130   IRONPCTSAT 8* 09/24/2013 1117     EKG Orders placed or performed during the hospital encounter of 04/02/14  . EKG     Prior Assessment and Plan Problem List as of 04/11/2014      Cardiovascular and Mediastinum   Orthostatic hypotension   HTN (hypertension), benign   Last Assessment & Plan 06/27/2013 Office Visit Written 06/27/2013  1:30 PM by Jodelle Gross, NP    Doing well. She is compliant and symptom free. She will come back in 6 months with no changes in her medication regimen.       Prolonged Q-T interval on ECG   Gastric AVM   PAT (paroxysmal atrial tachycardia)   Last Assessment & Plan 05/23/2012 Office Visit Edited 05/23/2012  5:00 PM by Thurmon Fair, MD    Irregular rhythm due to frequent PACs, at time blocked, creating appearance of second degree heart block when she was on Verapamil.  She is unaware of palpitations. No convincing evidence of atrial fibrillation is documented. Today on ECG there is a smooth transition from an ectopic atrial rhythm with "spiky" P waves, upright in V1 to fused P waves and then sinus  Pwaves. As the arrhythmia is asymptomatic, there is no firm documentation of AFib and she has had severe (transfusion-requiring) blood loss anemia, recommend staying off ASA and warfarin. She is tolerating the low dose metoprolol well.      Hypertrophic cardiomyopathy   Last Assessment & Plan 06/27/2013 Office Visit Written 06/27/2013  1:31 PM by Jodelle Gross, NP    Follow up echo will be completed in a year or so for ongoing assessment after longterm medication regimen.      AVM (congenital arteriovenous malformation)   Atrial tachycardia   Last Assessment & Plan 06/27/2013 Office Visit Written 06/27/2013  1:32 PM by Jodelle Gross, NP    Heart rate is well controlled. She is not a candidate for anticoagulation due to AVM and GIB. She will continue Cardiazem and metoprolol. 90- day supply is prescribed.       Atrial flutter   Atrial fibrillation with RVR   Atrial fibrillation with rapid ventricular response   Atrial flutter with rapid ventricular response   TIA (transient ischemic attack)   Stroke, acute, embolic   Biatrial enlargement     Respiratory   Healthcare-associated pneumonia   COPD (chronic obstructive pulmonary disease)   HCAP (healthcare-associated pneumonia)     Digestive   GI bleed   Autoimmune hepatitis   Gallstones   UGI bleed   Candida esophagitis     Endocrine   Adrenal insufficiency     Musculoskeletal and Integument   Rheumatoid arthritis     Genitourinary   Renal failure   UTI (urinary tract infection)   Renal calculus   Acute renal insufficiency   Chronic renal disease, stage 3, moderately decreased glomerular filtration rate between 30-59 mL/min/1.73 square meter   Anemia of chronic renal failure, stage 3 (moderate)   Last Assessment & Plan 02/03/2014 Office Visit Written 02/03/2014  3:25 PM by Ellouise Newer, PA-C    Causing anemia, requiring ESA intervention from time to time.  Given recent Hemoglobin values, supportive therapy plan is  deleted since ESA dosing was inappropriate.  Will re-institute if needed.  Labs every 4 weeks: CBC diff.        Hematopoietic and Hemostatic   Other pancytopenia     Other   Chronic back pain   Sedimentation rate elevation   Elevated liver enzymes   Microcytic anemia   Bradycardia   Abdominal pain, acute, generalized   Nausea   Diarrhea   Hypomagnesemia   Hypokalemia   Thrombocytopenia   Leg pain   Hypoalbuminemia   Guaiac positive stools   Anemia of chronic disease   Last Assessment & Plan 02/03/2014 Office Visit Edited 02/03/2014  3:26 PM by Ellouise Newer, PA-C    Secondary to rheumatoid arthritis.  Labs every 4 weeks: CBC diff      Hypersplenism   Iron deficiency anemia due to chronic blood loss   Last Assessment & Plan 02/03/2014 Office Visit Edited 02/03/2014  3:26 PM by Ellouise Newer, PA-C    We will continue to monitor labs and administer IV iron as needed per lab values.  IDA is secondary to chronic GI blood loss.  Labs every 4 weeks: CBC Diff, Ferritin.  Return in 3 months for follow-up.  Aphasia       Imaging: Ct Abdomen Pelvis Wo Contrast  03/31/2014   CLINICAL DATA:  Patient c/o lower abd pain since this am/ hx HTN, colitis, GI bleed, hepatitis, renal stones, colonic and gastric ulcers and CKDPatient is allergic to IV contrast  EXAM: CT ABDOMEN AND PELVIS WITHOUT CONTRAST  TECHNIQUE: Multidetector CT imaging of the abdomen and pelvis was performed following the standard protocol without IV contrast.  COMPARISON:  08/10/2011.  FINDINGS: Mild cardiomegaly. Mild lung base interstitial thickening similar to the prior study. No pleural effusion.  Liver and spleen are unremarkable. No evidence of acute cholecystitis. Pancreas is unremarkable. No bile duct dilation. Calcified nodes adjacent to the pancreas and along the gastrohepatic ligament are stable. No adrenal masses.  Low-density renal masses, largest arising from the midpole of the left kidney measuring 4.2 cm.  These are similar to the prior study consistent with cysts. No hydronephrosis. Normal ureters. Bladder is minimally distended but otherwise unremarkable.  Uterus and adnexa are unremarkable.  Dense atherosclerotic calcifications noted along a normal caliber abdominal aorta and branch vessels.  There are no pathologically enlarged lymph nodes.  No ascites.  There are scattered colonic diverticula. No diverticulitis. Colon otherwise unremarkable. Normal small bowel. Normal appendix.  There are degenerative changes of the lumbar spine. Bones are diffusely demineralized.  IMPRESSION: 1. No acute findings. 2. No evidence of colitis/diverticulitis. No bowel inflammatory changes. 3. Normal appendix visualized. 4. Cardiomegaly with interstitial thickening at the lung bases. Interstitial thickening may be chronic, and is similar to the prior study. Consider mild congestive heart failure interstitial edema if the patient short of breath. 5. Chronic findings include atherosclerotic calcifications of the aorta, calcified nodes in right upper abdomen, presumed renal cysts and degenerative changes of the lumbar spine.   Electronically Signed   By: Amie Portland M.D.   On: 03/31/2014 21:50   Dg Chest 2 View  03/27/2014   CLINICAL DATA:  One week history of cough, clinical diagnosis of bronchitis, history of pneumonia in 2015, occasional tobacco use  EXAM: CHEST  2 VIEW  COMPARISON:  PA and lateral chest x-ray dated January 03, 2014  FINDINGS: The lungs remain hyperinflated with hemidiaphragm flattening. There is no focal infiltrate. Coarse infrahilar lung markings are stable. The cardiac silhouette remains enlarged. The pulmonary vascularity is normal. There is no pleural effusion. The bony thorax is unremarkable.  IMPRESSION: COPD and cardiomegaly, stable. There is no active cardiopulmonary disease.   Electronically Signed   By: David  [place]   On: 03/27/2014 09:56   Dg Thoracic Spine W/swimmers  03/27/2014   CLINICAL DATA:   Mid back and low back pain for the past 3 months  EXAM: THORACIC SPINE - 2 VIEW + SWIMMERS  COMPARISON:  PA and lateral chest x-ray of January 03, 2014  FINDINGS: The thoracic vertebral bodies are preserved in height. The disc space heights are mildly narrowed from C4 through C7 which is a chronic finding. The pedicles are intact. There are no abnormal paravertebral soft tissue densities.  IMPRESSION: There is no compression fracture. There is degenerative disc space narrowing of the mid thoracic spine.   Electronically Signed   By: David  [place]   On: 03/27/2014 09:58   Dg Lumbar Spine Complete  03/27/2014   CLINICAL DATA:  Right-sided low back pain radiating into both legs for the preceding 3 months  EXAM: LUMBAR SPINE - COMPLETE 4+ VIEW  COMPARISON:  Lumbar spine series of September 18, 2011  FINDINGS: The  lumbar vertebral bodies are preserved in height. There is mild to moderate disc space narrowing at L4-5 and L5-S1 which is stable. There is no spondylolisthesis. There is facet joint hypertrophy bilaterally at L4-5 and L5-S1. The pedicles and transverse processes are intact. The observed portions of the sacrum are normal. There is stable rounded calcifications within the visualized portions of the abdomen some of which are related to the pancreas seen on the CT scan of August 2013.  IMPRESSION: There is moderate stable degenerative disc and facet joint change in the lower lumbar spine. There is no compression fracture nor other acute bony abnormality. Lumbar spine MRI may be useful if the patient's symptoms persist.   Electronically Signed   By: David  [place]   On: 03/27/2014 10:00   Ct Head Wo Contrast  04/02/2014   CLINICAL DATA:  Left-sided weakness.  Slurred speech.  Now resolved.  EXAM: CT HEAD WITHOUT CONTRAST  TECHNIQUE: Contiguous axial images were obtained from the base of the skull through the vertex without intravenous contrast.  COMPARISON:  None.  FINDINGS: No intracranial hemorrhage, mass  effect, or midline shift. No hydrocephalus. The basilar cisterns are patent. No evidence of territorial infarct. No intracranial fluid collection. Age related atrophy and mild chronic small vessel ischemia. Dense atherosclerosis noted at the intracranial vasculature at the skull base Calvarium is intact. Included paranasal sinuses and mastoid air cells are well aerated.  IMPRESSION: No acute intracranial abnormality.   Electronically Signed   By: Rubye Oaks M.D.   On: 04/02/2014 23:42   Mr Laqueta Jean ZO Contrast  04/03/2014   CLINICAL DATA:  Left-sided weakness.  Slurred speech.  EXAM: MRI HEAD WITHOUT AND WITH CONTRAST  TECHNIQUE: Multiplanar, multiecho pulse sequences of the brain and surrounding structures were obtained without and with intravenous contrast.  CONTRAST:  14mL MULTIHANCE GADOBENATE DIMEGLUMINE 529 MG/ML IV SOLN  COMPARISON:  CT 04/02/2014  FINDINGS: Diffusion imaging shows a punctate acute infarction affecting a left parietal gyrus. There is a possible punctate subacute infarction in a different left parietal gyrus. No recent insult is seen affecting the right hemisphere.  The brainstem and cerebellum are normal. There are mild chronic small-vessel ischemic changes affecting the cerebral hemispheric white matter. No large vessel territory infarction. No mass lesion, hemorrhage, hydrocephalus or extra-axial collection. After contrast administration, no abnormal enhancement occurs. No pituitary mass. No inflammatory sinus disease. No skull or skullbase lesion.  IMPRESSION: Punctate acute infarction affecting a left parietal gyrus. Possible punctate subacute infarction affecting a different left parietal gyrus. Findings are consistent with micro emboli in the left carotid system.   Electronically Signed   By: Paulina Fusi M.D.   On: 04/03/2014 13:27   US Carotid Bilateral  04/03/2014   CLINICAL DATA:  81 year old female with a history of TIA, aphasia, slurred speech.  Cardiovascular risk  factors include hypertension, prior stroke/ TIA, tobacco use  EXAM: BILATERAL CAROTID DUPLEX ULTRASOUND  TECHNIQUE: Wallace Cullens scale imaging, color Doppler and duplex ultrasound were performed of bilateral carotid and vertebral arteries in the neck.  COMPARISON:  None  FINDINGS: Criteria: Quantification of carotid stenosis is based on velocity parameters that correlate the residual internal carotid diameter with NASCET-based stenosis levels, using the diameter of the distal internal carotid lumen as the denominator for stenosis measurement.  The following velocity measurements were obtained:  RIGHT  ICA:  Systolic 60 cm/sec, Diastolic 18 cm/sec  CCA:  50 cm/sec  SYSTOLIC ICA/CCA RATIO:  1.2  ECA:  52 cm/sec  LEFT  ICA:  Systolic 50 set cm/sec, Diastolic 15 cm/sec  CCA:  68 cm/sec  SYSTOLIC ICA/CCA RATIO:  0.8  ECA:  41 cm/sec  Right Brachial SBP: Not acquired  Left Brachial SBP: Not acquired  RIGHT CAROTID ARTERY: No significant atherosclerotic disease of the common carotid artery. Intermediate waveform maintained. Heterogeneous and partially calcified plaque at the right carotid bifurcation. Low resistance waveform of the right ICA. Mild tortuosity.  RIGHT VERTEBRAL ARTERY: Antegrade flow with low resistance waveform.  LEFT CAROTID ARTERY: Atherosclerotic changes of the left common carotid artery with intermediate waveform maintained. Heterogeneous and calcified plaque at the left carotid bifurcation. Low resistance waveform of the left ICA.  LEFT VERTEBRAL ARTERY:  Antegrade flow with low resistance waveform.  IMPRESSION: Color duplex indicates moderate heterogeneous and calcified plaque, with no hemodynamically significant stenosis by duplex criteria in the extracranial cerebrovascular circulation.  Signed,  Yvone Neu. Loreta Ave, DO  [facility]  [facility]   Electronically Signed   By: Gilmer Mor D.O.   On: 04/03/2014 10:19

## 2014-04-11 NOTE — Patient Instructions (Signed)
Your physician recommends that you schedule a follow-up appointment in: With Dr. Purvis Sheffield as scheduled  Your physician recommends that you continue on your current medications as directed. Please refer to the Current Medication list given to you today.  Thank you for choosing [facility]!

## 2014-04-11 NOTE — Progress Notes (Signed)
Cardiology Office Note   Date:  04/11/2014   ID:  Eileen Santana, DOB [birth year], MRN [id number]  PCP:  Eileen Courier, MD  Cardiologist:  Eileen Sizer, NP   Chief Complaint  Patient presents with  . Hypertension  . Cerebrovascular Accident      History of Present Illness: Eileen Santana is a 81 y.o. female who presents for assessment and management of hypertension, with recent hospitalization in the setting of acute punctate, strokes, left punctate cerebral infarctions, likely embolic, chronic atrial flutter and atrial fibrillation with RVR on admission.  The patient was started on diltiazem drip.  During hospitalization, and transitioned to oral diltiazem 180 mg twice a day.  She was continued on metoprolol.  She did have a mildly low blood pressure and was given gentle hydration.  Echocardiogram was completed revealing LVEF of 55% to 60%.  Her mitral valve revealed severely calcified annulus with mild regurg.  The left atrium was severely dilated.  PA pressure 40 mm of mercury.  She has not started, coagulation that she has a history of severe upper GI bleeding.  Discussion with the patient and her daughter concerning use of anticoagulation was completed and they refused this medication. She also has severe iron deficiency anemia, which will be followed by her primary care physician.  She comes today with her daughter, who is a Designer, jewellery, and the Risk analyst.  She states that when they got home.  The dose of diltiazem IV, and 20 mg twice a day did not keep her heart rate well controlled.  They returned to 360 mg daily.  She felt better, and her blood pressure remained stable.  She had been on Lopressor 75 mg daily, and it was decreased to 50 mg daily on followup with primary care physician, Eileen Santana.  The patient is also a sense received an iron transfusion.  She is feeling much better without complaints of shortness of breath, dizziness, or fluid retention.  Past  Medical History  Diagnosis Date  . History of GI bleed     Associated with NSAIDS  . Iron deficiency anemia     Transfusion dependent  . DJD (degenerative joint disease)   . Essential hypertension, benign   . History of colitis   . Ileitis   . Pancytopenia   . Autoimmune hepatitis     With leukocytoclastic vasculitis  . Heart block AV second degree March 2013    a. Cardiology consult note 06/2013: "Question of previous second degree heart block, although review of cardiology notes indicates that this may have been actually blocked PACs when the patient was on verapamil."  . Bronchitis   . Steroid-induced myopathy   . Renal calculus 08/12/2011  . Rheumatoid arthritis(714.0)   . Colon ulcer 04/2010    NSAID related  . Gastric ulcer 04/2010    NSAID related  . Cancer   . UGI bleed 09/20/2011    Focal area of gastritis oozing of blood  . Gastric AVM 09/20/2011  . Candida esophagitis 09/20/2011  . Paroxysmal atrial fibrillation     Not on anticoag 2/2 hx of GIB/AVM  . Pericarditis     2D Echo EF 65%-70%  . CKD (chronic kidney disease), stage III   . Cholestatic hepatitis   . UTI (lower urinary tract infection)     history  . Paroxysmal atrial flutter     Not on anticoag 2/2 hx of GIB/AVM - dx 06/2013.  . Multifocal atrial tachycardia   . Hypertrophic cardiomyopathy  Echo 03/2011: severe LVH suggesting possble infiltrate cardiomyopathy or advanced hypertensive heart disease, increased  echogenicity of the ventricular septum as well as the pericardium, EF >70% with end systolicmid-cavitary obliteration of the ventricle, stage 1 DD, mild MR, small IVC.  Marland Kitchen Hypomagnesemia   . Chronic renal disease, stage 3, moderately decreased glomerular filtration rate between 30-59 mL/min/1.73 square meter 02/03/2014  . Anemia of chronic disease 11/25/2011  . Anemia of chronic renal failure, stage 3 (moderate) 02/03/2014  . Low back pain   . 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 Biatrial enlargement  04/04/2014    Past Surgical History  Procedure Laterality Date  . Colonoscopy  04/2010  . Upper gastrointestinal endoscopy  04/2010  . Esophagogastroduodenoscopy  09/20/2011    Status post APC.  Marland Kitchen Esophagogastroduodenoscopy  09/20/2011    Procedure: ESOPHAGOGASTRODUODENOSCOPY (EGD);  Surgeon: Eileen Hippo, MD;  Location: [facility];  Service: Endoscopy;  Laterality: N/A;  . Cyst removal hand      Elbow     Current Outpatient Prescriptions  Medication Sig Dispense Refill  . acetaminophen (TYLENOL) 325 MG tablet Take 650 mg by mouth every 6 (six) hours as needed. For pain    . aspirin 81 MG chewable tablet Chew 2 tablets (162 mg total) by mouth daily.    Marland Kitchen azaTHIOprine (IMURAN) 50 MG tablet TAKE 1 AND 1/2 TABLETS BY MOUTH ONCE DAILY 60 tablet 5  . Cholecalciferol (VITAMIN D) 2000 UNITS tablet Take 2,000 Units by mouth daily.    . Coenzyme Q10 200 MG capsule Take 200 mg by mouth daily.    . Darbepoetin Alfa (ARANESP) 500 MCG/ML SOSY injection Inject 500 mcg into the skin every 21 ( twenty-one) days.    Marland Kitchen DEXILANT 60 MG capsule TAKE 1 CAPSULE BY MOUTH EVERY OTHER DAY 45 capsule 4  . diclofenac sodium (VOLTAREN) 1 % GEL Apply 1 application topically daily as needed (for pain). Apply to back and legs    . diltiazem (TIAZAC) 360 MG 24 hr capsule Take 1 capsule (360 mg total) by mouth daily. 30 capsule 1  . fish oil-omega-3 fatty acids 1000 MG capsule Take 1 g by mouth daily.    . fluticasone (FLONASE) 50 MCG/ACT nasal spray Place 2 sprays into both nostrils 2 (two) times daily.    . folic acid (FOLVITE) 800 MCG tablet Take 800 mcg by mouth daily.     . furosemide (LASIX) 20 MG tablet Take 20 mg by mouth daily as needed. For fluid retention    . Magnesium 400 MG TABS Take 1 tablet by mouth 2 (two) times daily.    . metoprolol (LOPRESSOR) 50 MG tablet Take 1 tablet 2 times daily on Saturday and Sunday and resume one and a half tablets 2 times daily on Monday 04/06/14. (Patient taking  differently: Take 50 mg by mouth 2 (two) times daily. Take 1 tablet 2 times daily on Saturday and Sunday and resume one and a half tablets 2 times daily on Monday 04/06/14.)    . Multiple Vitamins-Minerals (MULTIVITAMIN WITH MINERALS) tablet Take 1 tablet by mouth daily.      . potassium chloride SA (K-DUR,KLOR-CON) 20 MEQ tablet TAKE 1 TABLET BY MOUTH ONCE DAILY 30 tablet 5  . predniSONE (DELTASONE) 5 MG tablet Take 7.5 mg by mouth daily.    Marland Kitchen spironolactone (ALDACTONE) 50 MG tablet TAKE 1 TABLET BY MOUTH DAILY 90 tablet 1  . Travoprost, BAK Free, (TRAVATAN) 0.004 % SOLN ophthalmic solution Place 1 drop into both  eyes at bedtime.    . vitamin B-12 (CYANOCOBALAMIN) 1000 MCG tablet Take 500 mcg by mouth daily.     . ursodiol (ACTIGALL) 250 MG tablet TAKE 2 TABLETS BY MOUTH TWICE DAILY (Patient taking differently: TAKE 300MG  BY MOUTH TWICE DAILY) 120 tablet 4   No current facility-administered medications for this visit.   Facility-Administered Medications Ordered in Other Visits  Medication Dose Route Frequency Provider Last Rate Last Dose  . 0.9 %  sodium chloride infusion   Intravenous Continuous Ellouise Newer, PA-C   Stopped at 10/04/13 1230  . diphenhydrAMINE (BENADRYL) capsule 25 mg  25 mg Oral Q6H PRN Glenford Peers, MD   25 mg at 05/13/11 6812    Allergies:   Iohexol; Ivp dye; Naprosyn; Red blood cells; Verapamil; Vicodin; and Sulfa antibiotics    Social History:  The patient  reports that she has been smoking Cigarettes.  She started smoking about 69 years ago. She has been smoking about 0.25 packs per day. She has never used smokeless tobacco. She reports that she does not drink alcohol or use illicit drugs.   Family History:  The patient's family history includes Arthritis in her brother; COPD in her brother; Heart disease (age of onset: 81) in her mother; Heart disease (age of onset: 53) in her father; Hypertension in her brother and sister; Osteoporosis in her sister; Stroke in her  mother.    ROS: .   All other systems are reviewed and negative.Unless otherwise mentioned in  H&P above.   PHYSICAL EXAM: VS:  BP 112/58 mmHg  Pulse 462  Ht 5\' 6"  (1.676 m)  Wt 152 lb (68.947 kg)  BMI 24.55 kg/m2  SpO2 99% , BMI Body mass index is 24.55 kg/(m^2). GEN: Well nourished, well developed, in no acute distress HEENT: normal Neck: no JVD, carotid bruits, or masses Cardiac: IRRR; no murmurs, rubs, or gallops,no edema  Respiratory:  Bibasilar crackles, cleared with cough. GI: soft, nontender, nondistended, + BS MS: no deformity or atrophy Skin: warm and dry, no rash Neuro:  Strength and sensation are intact Psych: euthymic mood, full affect   Recent Labs: 06/11/2013: Pro B Natriuretic peptide (BNP) 1496.0* 01/03/2014: B Natriuretic Peptide 383.0* 03/18/2014: Magnesium 1.5 03/31/2014: ALT 17 04/02/2014: TSH 1.302 04/04/2014: BUN 21; Creatinine 1.04; Potassium 4.2; Sodium 138 04/10/2014: Hemoglobin 11.7*; Platelets 94*    Lipid Panel    Component Value Date/Time   CHOL 196 03/06/2011 0410   TRIG 41 03/06/2011 0410   HDL 68 03/06/2011 0410   CHOLHDL 2.9 03/06/2011 0410   VLDL 8 03/06/2011 0410   LDLCALC 120* 03/06/2011 0410      Wt Readings from Last 3 Encounters:  04/11/14 152 lb (68.947 kg)  04/04/14 152 lb 8.9 oz (69.2 kg)  04/03/14 150 lb (68.04 kg)      Other studies Reviewed: Additional studies/ records that were reviewed today include: MRI, and lab work. Review of the above records demonstrates: potassium at 4.1 on 04/04/2014.   ASSESSMENT AND PLAN:  1.  A. Fib/flutter: It was advised that she is not a candidate bradycardia relation due to history of severe GI bleeding, secondary to gastric AVMs.  Systolic function is normal.  Heart rate is currently well-controlled.  Will not make any changes in her medication regimen at this time.  Her daughter is keeping track of her blood pressure and heart rate at home.  We will keep appointment with Dr. Wyline Mood in  May.  2. Recent CVA, punctate cerebral, effusions: no  focal neurologic deficits at this time.  3. Hypertension:blood pressure is low normal in the office.  Blood pressure at home 120s systolically over 60s and 70s.  She appears to be tolerating this.  I am considering decrease in her spironolactone to 25 mg a for blood pressure range low, as it is better to have a higher blood pressure in this elderly female.  We will continue to monitor this and make changes on followup if necessary.   Current medicines are reviewed at length with the patient today.    Labs/ tests ordered today include: None,  Labs are followed by oncology, and GI.  No orders of the defined types were placed in this encounter.     Disposition:   FU with 1 month on previously scheduled appointment.  Signed, Joni Reining, NP  04/11/2014 2:58 PM    [facility] [address] [place], [place] [address] Phone: [phone]; Fax: [phone]

## 2014-04-26 NOTE — Assessment & Plan Note (Signed)
Secondary to rheumatoid arthritis.  Labs every 4 weeks: CBC diff

## 2014-04-26 NOTE — Progress Notes (Signed)
Eileen Courier, MD [address] [place] [address]  Anemia of chronic disease - Plan: CBC with Differential  Anemia of chronic renal failure, stage 3 (moderate) - Plan: CBC with Differential, Comprehensive metabolic panel  Iron deficiency anemia due to chronic blood loss - Plan: CBC with Differential, Ferritin  Stroke, acute, embolic  CURRENT THERAPY: Intermittent IV Ferahame PRN. ESA supportive therapy plan deleted as it was not renal dosing and we will re-institute this treatment in the future if needed at the appropriate dosing and schedule.   INTERVAL HISTORY: Eileen Santana 81 y.o. female returns for followup of anemia of chronic disease with rheumatoid arthritis, anemia of chronic renal disease, and iron deficiency with blood loss secondary to intestinal AV malformations, chronic kidney disease, hypersplenism and history of depletion of iron stores by the use of ESA in past.   I personally reviewed and went over laboratory results with the patient.  The results are noted within this dictation.  I personally reviewed and went over radiographic studies with the patient.  The results are noted within this dictation.    Chart reviewed. Cheyane was in the hospital from 3/30- 04/04/2014 for expressive aphasia secondary to acute punctate stroke, embolic.  She was started on antiplatelet therapy with 162 mg of ASA.  She notes her legs are still weak, but are improving.  "They have to get better by May because I have the senior games coming up."  She is not bowling this year, instead she is going to participate in shuffle board.  She denies any GI and GU blood loss.  Hematologically, she denies any complaints and ROS questioning is negative.  Past Medical History  Diagnosis Date  . History of GI bleed     Associated with NSAIDS  . Iron deficiency anemia     Transfusion dependent  . DJD (degenerative joint disease)   . Essential hypertension, benign   . History of  colitis   . Ileitis   . Pancytopenia   . Autoimmune hepatitis     With leukocytoclastic vasculitis  . Heart block AV second degree March 2013    a. Cardiology consult note 06/2013: "Question of previous second degree heart block, although review of cardiology notes indicates that this may have been actually blocked PACs when the patient was on verapamil."  . Bronchitis   . Steroid-induced myopathy   . Renal calculus 08/12/2011  . Rheumatoid arthritis(714.0)   . Colon ulcer 04/2010    NSAID related  . Gastric ulcer 04/2010    NSAID related  . Cancer   . UGI bleed 09/20/2011    Focal area of gastritis oozing of blood  . Gastric AVM 09/20/2011  . Candida esophagitis 09/20/2011  . Paroxysmal atrial fibrillation     Not on anticoag 2/2 hx of GIB/AVM  . Pericarditis     2D Echo EF 65%-70%  . CKD (chronic kidney disease), stage III   . Cholestatic hepatitis   . UTI (lower urinary tract infection)     history  . Paroxysmal atrial flutter     Not on anticoag 2/2 hx of GIB/AVM - dx 06/2013.  . Multifocal atrial tachycardia   . Hypertrophic cardiomyopathy     Echo 03/2011: severe LVH suggesting possble infiltrate cardiomyopathy or advanced hypertensive heart disease, increased  echogenicity of the ventricular septum as well as the pericardium, EF >70% with end systolicmid-cavitary obliteration of the ventricle, stage 1 DD, mild MR, small IVC.  Marland Kitchen  Hypomagnesemia   . Chronic renal disease, stage 3, moderately decreased glomerular filtration rate between 30-59 mL/min/1.73 square meter 02/03/2014  . Anemia of chronic disease 11/25/2011  . Anemia of chronic renal failure, stage 3 (moderate) 02/03/2014  . Low back pain   . 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 Biatrial enlargement 04/04/2014    has Other pancytopenia; Sedimentation rate elevation; Elevated liver enzymes; GI bleed; Microcytic anemia; Orthostatic hypotension; Bradycardia; Renal failure; Autoimmune hepatitis; HTN (hypertension), benign; Abdominal  pain, acute, generalized; Nausea; Diarrhea; Hypomagnesemia; Hypokalemia; Prolonged Q-T interval on ECG; UTI (urinary tract infection); Gallstones; Thrombocytopenia; Renal calculus; Chronic back pain; Leg pain; Rheumatoid arthritis; Acute renal insufficiency; Hypoalbuminemia; Guaiac positive stools; UGI bleed; Gastric AVM; Candida esophagitis; Anemia of chronic disease; PAT (paroxysmal atrial tachycardia); Hypertrophic cardiomyopathy; Hypersplenism; Iron deficiency anemia due to chronic blood loss; AVM (congenital arteriovenous malformation); Atrial tachycardia; Atrial flutter; Atrial fibrillation with RVR; Healthcare-associated pneumonia; COPD (chronic obstructive pulmonary disease); HCAP (healthcare-associated pneumonia); Atrial fibrillation with rapid ventricular response; Atrial flutter with rapid ventricular response; Chronic renal disease, stage 3, moderately decreased glomerular filtration rate between 30-59 mL/min/1.73 square meter; Anemia of chronic renal failure, stage 3 (moderate); TIA (transient ischemic attack); Adrenal insufficiency; Aphasia; Stroke, acute, embolic; and Biatrial enlargement on her problem list.     is allergic to iohexol; ivp dye; naprosyn; red blood cells; verapamil; vicodin; and sulfa antibiotics.  Eileen Santana does not currently have medications on file.  Past Surgical History  Procedure Laterality Date  . Colonoscopy  04/2010  . Upper gastrointestinal endoscopy  04/2010  . Esophagogastroduodenoscopy  09/20/2011    Status post APC.  Marland Kitchen Esophagogastroduodenoscopy  09/20/2011    Procedure: ESOPHAGOGASTRODUODENOSCOPY (EGD);  Surgeon: Malissa Hippo, MD;  Location: [facility];  Service: Endoscopy;  Laterality: N/A;  . Cyst removal hand      Elbow    Denies any headaches, dizziness, double vision, fevers, chills, night sweats, nausea, vomiting, diarrhea, constipation, chest pain, heart palpitations, shortness of breath, blood in stool, black tarry stool, urinary pain,  urinary burning, urinary frequency, hematuria.   PHYSICAL EXAMINATION  ECOG PERFORMANCE STATUS: 2 - Symptomatic, <50% confined to bed  Filed Vitals:   04/28/14 1342  BP: 122/60  Pulse: 76  Temp: 98 F (36.7 C)  Resp: 18    GENERAL:alert, no distress, well nourished, well developed, comfortable, cooperative, smiling and unaccompanied. SKIN: skin color, texture, turgor are normal, no rashes or significant lesions HEAD: Normocephalic, No masses, lesions, tenderness or abnormalities EYES: normal, PERRLA, EOMI, Conjunctiva are pink and non-injected EARS: External ears normal OROPHARYNX:lips, buccal mucosa, and tongue normal and mucous membranes are moist  NECK: supple, no adenopathy, thyroid normal size, non-tender, without nodularity, trachea midline LYMPH:  no palpable lymphadenopathy BREAST:not examined LUNGS: clear to auscultation  HEART: regular rate & rhythm, no murmurs and no gallops ABDOMEN:abdomen soft and normal bowel sounds BACK: Back symmetric, no curvature. EXTREMITIES:less then 2 second capillary refill, no joint deformities, effusion, or inflammation, no skin discoloration, no cyanosis  NEURO: alert & oriented x 3 with fluent speech, no focal motor/sensory deficits, in wheelchair    LABORATORY DATA: CBC    Component Value Date/Time   WBC 3.2* 04/28/2014 1245   RBC 3.99 04/28/2014 1245   RBC 3.62* 03/16/2012 0937   HGB 11.5* 04/28/2014 1245   HCT 35.1* 04/28/2014 1245   PLT 124* 04/28/2014 1245   MCV 88.0 04/28/2014 1245   MCH 28.8 04/28/2014 1245   MCHC 32.8 04/28/2014 1245   RDW 19.5* 04/28/2014 1245  LYMPHSABS 0.4* 04/28/2014 1245   MONOABS 0.4 04/28/2014 1245   EOSABS 0.1 04/28/2014 1245   BASOSABS 0.0 04/28/2014 1245      Chemistry      Component Value Date/Time   NA 138 04/04/2014 0839   K 4.2 04/04/2014 0839   CL 107 04/04/2014 0839   CO2 25 04/04/2014 0839   BUN 21 04/04/2014 0839   CREATININE 1.04 04/04/2014 0839   CREATININE 1.37*  03/18/2014 1026      Component Value Date/Time   CALCIUM 9.3 04/04/2014 0839   ALKPHOS 57 03/31/2014 1851   AST 76* 03/31/2014 1851   ALT 17 03/31/2014 1851   BILITOT 1.0 03/31/2014 1851      Lab Results  Component Value Date   IRON 25* 09/24/2013   TIBC 325 09/24/2013   FERRITIN 90 03/31/2014     RADIOGRAPHIC STUDIES:  Ct Abdomen Pelvis Wo Contrast  03/31/2014   CLINICAL DATA:  Patient c/o lower abd pain since this am/ hx HTN, colitis, GI bleed, hepatitis, renal stones, colonic and gastric ulcers and CKDPatient is allergic to IV contrast  EXAM: CT ABDOMEN AND PELVIS WITHOUT CONTRAST  TECHNIQUE: Multidetector CT imaging of the abdomen and pelvis was performed following the standard protocol without IV contrast.  COMPARISON:  08/10/2011.  FINDINGS: Mild cardiomegaly. Mild lung base interstitial thickening similar to the prior study. No pleural effusion.  Liver and spleen are unremarkable. No evidence of acute cholecystitis. Pancreas is unremarkable. No bile duct dilation. Calcified nodes adjacent to the pancreas and along the gastrohepatic ligament are stable. No adrenal masses.  Low-density renal masses, largest arising from the midpole of the left kidney measuring 4.2 cm. These are similar to the prior study consistent with cysts. No hydronephrosis. Normal ureters. Bladder is minimally distended but otherwise unremarkable.  Uterus and adnexa are unremarkable.  Dense atherosclerotic calcifications noted along a normal caliber abdominal aorta and branch vessels.  There are no pathologically enlarged lymph nodes.  No ascites.  There are scattered colonic diverticula. No diverticulitis. Colon otherwise unremarkable. Normal small bowel. Normal appendix.  There are degenerative changes of the lumbar spine. Bones are diffusely demineralized.  IMPRESSION: 1. No acute findings. 2. No evidence of colitis/diverticulitis. No bowel inflammatory changes. 3. Normal appendix visualized. 4. Cardiomegaly with  interstitial thickening at the lung bases. Interstitial thickening may be chronic, and is similar to the prior study. Consider mild congestive heart failure interstitial edema if the patient short of breath. 5. Chronic findings include atherosclerotic calcifications of the aorta, calcified nodes in right upper abdomen, presumed renal cysts and degenerative changes of the lumbar spine.   Electronically Signed   By: Amie Portland M.D.   On: 03/31/2014 21:50   Ct Head Wo Contrast  04/02/2014   CLINICAL DATA:  Left-sided weakness.  Slurred speech.  Now resolved.  EXAM: CT HEAD WITHOUT CONTRAST  TECHNIQUE: Contiguous axial images were obtained from the base of the skull through the vertex without intravenous contrast.  COMPARISON:  None.  FINDINGS: No intracranial hemorrhage, mass effect, or midline shift. No hydrocephalus. The basilar cisterns are patent. No evidence of territorial infarct. No intracranial fluid collection. Age related atrophy and mild chronic small vessel ischemia. Dense atherosclerosis noted at the intracranial vasculature at the skull base Calvarium is intact. Included paranasal sinuses and mastoid air cells are well aerated.  IMPRESSION: No acute intracranial abnormality.   Electronically Signed   By: Rubye Oaks M.D.   On: 04/02/2014 23:42   Mr Laqueta Jean ZO  Contrast  04/03/2014   CLINICAL DATA:  Left-sided weakness.  Slurred speech.  EXAM: MRI HEAD WITHOUT AND WITH CONTRAST  TECHNIQUE: Multiplanar, multiecho pulse sequences of the brain and surrounding structures were obtained without and with intravenous contrast.  CONTRAST:  14mL MULTIHANCE GADOBENATE DIMEGLUMINE 529 MG/ML IV SOLN  COMPARISON:  CT 04/02/2014  FINDINGS: Diffusion imaging shows a punctate acute infarction affecting a left parietal gyrus. There is a possible punctate subacute infarction in a different left parietal gyrus. No recent insult is seen affecting the right hemisphere.  The brainstem and cerebellum are normal.  There are mild chronic small-vessel ischemic changes affecting the cerebral hemispheric white matter. No large vessel territory infarction. No mass lesion, hemorrhage, hydrocephalus or extra-axial collection. After contrast administration, no abnormal enhancement occurs. No pituitary mass. No inflammatory sinus disease. No skull or skullbase lesion.  IMPRESSION: Punctate acute infarction affecting a left parietal gyrus. Possible punctate subacute infarction affecting a different left parietal gyrus. Findings are consistent with micro emboli in the left carotid system.   Electronically Signed   By: Paulina Fusi M.D.   On: 04/03/2014 13:27   US Carotid Bilateral  04/03/2014   CLINICAL DATA:  80 year old female with a history of TIA, aphasia, slurred speech.  Cardiovascular risk factors include hypertension, prior stroke/ TIA, tobacco use  EXAM: BILATERAL CAROTID DUPLEX ULTRASOUND  TECHNIQUE: Wallace Cullens scale imaging, color Doppler and duplex ultrasound were performed of bilateral carotid and vertebral arteries in the neck.  COMPARISON:  None  FINDINGS: Criteria: Quantification of carotid stenosis is based on velocity parameters that correlate the residual internal carotid diameter with NASCET-based stenosis levels, using the diameter of the distal internal carotid lumen as the denominator for stenosis measurement.  The following velocity measurements were obtained:  RIGHT  ICA:  Systolic 60 cm/sec, Diastolic 18 cm/sec  CCA:  50 cm/sec  SYSTOLIC ICA/CCA RATIO:  1.2  ECA:  52 cm/sec  LEFT  ICA:  Systolic 50 set cm/sec, Diastolic 15 cm/sec  CCA:  68 cm/sec  SYSTOLIC ICA/CCA RATIO:  0.8  ECA:  41 cm/sec  Right Brachial SBP: Not acquired  Left Brachial SBP: Not acquired  RIGHT CAROTID ARTERY: No significant atherosclerotic disease of the common carotid artery. Intermediate waveform maintained. Heterogeneous and partially calcified plaque at the right carotid bifurcation. Low resistance waveform of the right ICA. Mild  tortuosity.  RIGHT VERTEBRAL ARTERY: Antegrade flow with low resistance waveform.  LEFT CAROTID ARTERY: Atherosclerotic changes of the left common carotid artery with intermediate waveform maintained. Heterogeneous and calcified plaque at the left carotid bifurcation. Low resistance waveform of the left ICA.  LEFT VERTEBRAL ARTERY:  Antegrade flow with low resistance waveform.  IMPRESSION: Color duplex indicates moderate heterogeneous and calcified plaque, with no hemodynamically significant stenosis by duplex criteria in the extracranial cerebrovascular circulation.  Signed,  Yvone Neu. Loreta Ave, DO  [facility]  [facility]   Electronically Signed   By: Gilmer Mor D.O.   On: 04/03/2014 10:19      ASSESSMENT AND PLAN:  Anemia of chronic disease Secondary to rheumatoid arthritis.  Labs every 4 weeks: CBC diff     Anemia of chronic renal failure, stage 3 (moderate) Causing anemia, requiring ESA intervention from time to time.  Given recent Hemoglobin values, supportive therapy plan is deleted since ESA dosing was inappropriate.  Will re-institute if needed.  Labs every 4 weeks: CBC diff.     Iron deficiency anemia due to chronic blood loss We will continue to  monitor labs and administer IV iron as needed per lab values.  IDA is secondary to chronic GI blood loss.  Labs every 4 weeks: CBC Diff, Ferritin.  Return in 3 months for follow-up.     Stroke, acute, embolic Recent occurrence in March 2016.  On antiplatelet therapy with ASA 162 mg daily.     THERAPY PLAN:  Continue to monitor counts and support Hgb and iron.  All questions were answered. The patient knows to call the clinic with any problems, questions or concerns. We can certainly see the patient much sooner if necessary.  Patient and plan discussed with Dr. Loma Messing and she is in agreement with the aforementioned.   This note is electronically signed by:  Dellis Anes 04/28/2014 2:06 PM

## 2014-04-26 NOTE — Assessment & Plan Note (Signed)
We will continue to monitor labs and administer IV iron as needed per lab values.  IDA is secondary to chronic GI blood loss.  Labs every 4 weeks: CBC Diff, Ferritin.  Return in 3 months for follow-up. 

## 2014-04-26 NOTE — Assessment & Plan Note (Signed)
Causing anemia, requiring ESA intervention from time to time.  Given recent Hemoglobin values, supportive therapy plan is deleted since ESA dosing was inappropriate.  Will re-institute if needed.  Labs every 4 weeks: CBC diff.

## 2014-04-26 NOTE — Assessment & Plan Note (Signed)
Recent occurrence in March 2016.  On antiplatelet therapy with ASA 162 mg daily.

## 2014-04-28 ENCOUNTER — Encounter (HOSPITAL_COMMUNITY): Payer: Medicare Other

## 2014-04-28 ENCOUNTER — Encounter (HOSPITAL_BASED_OUTPATIENT_CLINIC_OR_DEPARTMENT_OTHER): Payer: Medicare Other

## 2014-04-28 ENCOUNTER — Encounter (HOSPITAL_BASED_OUTPATIENT_CLINIC_OR_DEPARTMENT_OTHER): Payer: Medicare Other | Admitting: Oncology

## 2014-04-28 ENCOUNTER — Encounter (HOSPITAL_COMMUNITY): Payer: Self-pay | Admitting: Oncology

## 2014-04-28 VITALS — BP 122/60 | HR 76 | Temp 98.0°F | Resp 18 | Wt 151.0 lb

## 2014-04-28 DIAGNOSIS — N183 Chronic kidney disease, stage 3 unspecified: Secondary | ICD-10-CM

## 2014-04-28 DIAGNOSIS — K922 Gastrointestinal hemorrhage, unspecified: Secondary | ICD-10-CM

## 2014-04-28 DIAGNOSIS — I639 Cerebral infarction, unspecified: Secondary | ICD-10-CM

## 2014-04-28 DIAGNOSIS — D500 Iron deficiency anemia secondary to blood loss (chronic): Secondary | ICD-10-CM | POA: Diagnosis not present

## 2014-04-28 DIAGNOSIS — D631 Anemia in chronic kidney disease: Secondary | ICD-10-CM | POA: Diagnosis not present

## 2014-04-28 DIAGNOSIS — D649 Anemia, unspecified: Secondary | ICD-10-CM

## 2014-04-28 DIAGNOSIS — D638 Anemia in other chronic diseases classified elsewhere: Secondary | ICD-10-CM

## 2014-04-28 LAB — CBC WITH DIFFERENTIAL/PLATELET
Basophils Absolute: 0 10*3/uL (ref 0.0–0.1)
Basophils Relative: 1 % (ref 0–1)
EOS PCT: 2 % (ref 0–5)
Eosinophils Absolute: 0.1 10*3/uL (ref 0.0–0.7)
HCT: 35.1 % — ABNORMAL LOW (ref 36.0–46.0)
Hemoglobin: 11.5 g/dL — ABNORMAL LOW (ref 12.0–15.0)
Lymphocytes Relative: 11 % — ABNORMAL LOW (ref 12–46)
Lymphs Abs: 0.4 10*3/uL — ABNORMAL LOW (ref 0.7–4.0)
MCH: 28.8 pg (ref 26.0–34.0)
MCHC: 32.8 g/dL (ref 30.0–36.0)
MCV: 88 fL (ref 78.0–100.0)
MONOS PCT: 11 % (ref 3–12)
Monocytes Absolute: 0.4 10*3/uL (ref 0.1–1.0)
Neutro Abs: 2.4 10*3/uL (ref 1.7–7.7)
Neutrophils Relative %: 75 % (ref 43–77)
Platelets: 124 10*3/uL — ABNORMAL LOW (ref 150–400)
RBC: 3.99 MIL/uL (ref 3.87–5.11)
RDW: 19.5 % — ABNORMAL HIGH (ref 11.5–15.5)
WBC: 3.2 10*3/uL — AB (ref 4.0–10.5)

## 2014-04-28 NOTE — Progress Notes (Signed)
Injection not needed

## 2014-04-28 NOTE — Patient Instructions (Signed)
[facility] at [facility] Discharge Instructions  RECOMMENDATIONS MADE BY THE CONSULTANT AND ANY TEST RESULTS WILL BE SENT TO YOUR REFERRING PHYSICIAN.  Lab work every 4 weeks with possible Aranesp injection. Office visit in 3 months with Dr. Galen Manila.   Thank you for choosing [facility] at [facility] to provide your oncology and hematology care.  To afford each patient quality time with our provider, please arrive at least 15 minutes before your scheduled appointment time.    You need to re-schedule your appointment should you arrive 10 or more minutes late.  We strive to give you quality time with our providers, and arriving late affects you and other patients whose appointments are after yours.  Also, if you no show three or more times for appointments you may be dismissed from the clinic at the providers discretion.     Again, thank you for choosing [facility].  Our hope is that these requests will decrease the amount of time that you wait before being seen by our physicians.       _____________________________________________________________  Should you have questions after your visit to [facility], please contact our office at [phone] between the hours of 8:30 a.m. and 4:30 p.m.  Voicemails left after 4:30 p.m. will not be returned until the following business day.  For prescription refill requests, have your pharmacy contact our office.

## 2014-04-28 NOTE — Progress Notes (Signed)
Eileen Santana presented for labwork. Labs per MD order drawn via Peripheral Line 23 gauge needle inserted in right AC.  Good blood return present. Procedure without incident.  Needle removed intact. Patient tolerated procedure well.   

## 2014-04-30 ENCOUNTER — Encounter (HOSPITAL_COMMUNITY): Payer: Medicare Other

## 2014-04-30 DIAGNOSIS — D500 Iron deficiency anemia secondary to blood loss (chronic): Secondary | ICD-10-CM

## 2014-04-30 LAB — FERRITIN: FERRITIN: 129 ng/mL (ref 10–291)

## 2014-04-30 NOTE — Progress Notes (Signed)
REDRAW OF FERR AT NO CHARGE

## 2014-05-09 ENCOUNTER — Encounter: Payer: Self-pay | Admitting: Cardiovascular Disease

## 2014-05-09 ENCOUNTER — Ambulatory Visit (INDEPENDENT_AMBULATORY_CARE_PROVIDER_SITE_OTHER): Payer: Medicare Other | Admitting: Cardiovascular Disease

## 2014-05-09 VITALS — BP 112/72 | HR 73 | Ht 65.0 in | Wt 151.0 lb

## 2014-05-09 DIAGNOSIS — Z87898 Personal history of other specified conditions: Secondary | ICD-10-CM

## 2014-05-09 DIAGNOSIS — I10 Essential (primary) hypertension: Secondary | ICD-10-CM | POA: Diagnosis not present

## 2014-05-09 DIAGNOSIS — I639 Cerebral infarction, unspecified: Secondary | ICD-10-CM | POA: Diagnosis not present

## 2014-05-09 DIAGNOSIS — I482 Chronic atrial fibrillation, unspecified: Secondary | ICD-10-CM

## 2014-05-09 DIAGNOSIS — I4891 Unspecified atrial fibrillation: Secondary | ICD-10-CM | POA: Diagnosis not present

## 2014-05-09 DIAGNOSIS — D509 Iron deficiency anemia, unspecified: Secondary | ICD-10-CM

## 2014-05-09 DIAGNOSIS — Z9289 Personal history of other medical treatment: Secondary | ICD-10-CM

## 2014-05-09 NOTE — Patient Instructions (Signed)
Your physician wants you to follow-up in: 6 months with Dr. Koneswaran. You will receive a reminder letter in the mail two months in advance. If you don't receive a letter, please call our office to schedule the follow-up appointment.  Your physician recommends that you continue on your current medications as directed. Please refer to the Current Medication list given to you today.  Thank you for choosing [facility]!   

## 2014-05-09 NOTE — Progress Notes (Signed)
Patient ID: Eileen Santana, female   DOB: 1930/07/03, 81 y.o.   MRN: [id number]      SUBJECTIVE: The patient presents for post hospital follow-up. She was hospitalized for acute punctate strokes since her last visit with me in January, and now takes 162 mg ASA. She developed rapid atrial flutter with a minimal troponin elevation (0.04 x 1). She is not deemed to be a suitable candidate for anticoagulation due to recurrent GI bleeds and transfusion-dependent anemia with a h/o anemia of chronic renal disease and rheumatoid arthritis. She also has a history of autoimmune hepatitis.  Echocardiogram on 04/03/14 demonstrated mild concentric left ventricular hypertrophy, LVEF 55-60%, severe biatrial enlargement, mild MR, and moderate TR.   She is feeling well today with respect to her heart and denies chest pain and palpitations. Her primary complaint relates to chronic back pain for which she uses lidocaine patches which only provide temporary relief.  She is here with her provider, [name].  Soc: Her daughter Olegario Messier is a former Wellsite geologist of an ICU and currently teaches nursing. She is very attentive to her mother's health.    Review of Systems: As per "subjective", otherwise negative.  Allergies  Allergen Reactions  . Iohexol Swelling    IV Dye   . Ivp Dye [Iodinated Diagnostic Agents] Swelling    Hives  . Naprosyn [Naproxen] Hives and Itching  . Red Blood Cells Swelling and Dermatitis    With blood transfusion 2012  . Verapamil     Heart block (2nd degree) Takes Cardizem   . Vicodin [Hydrocodone-Acetaminophen] Hives and Itching  . Sulfa Antibiotics Itching and Rash    Current Outpatient Prescriptions  Medication Sig Dispense Refill  . acetaminophen (TYLENOL) 325 MG tablet Take 650 mg by mouth every 6 (six) hours as needed. For pain    . aspirin 81 MG chewable tablet Chew 2 tablets (162 mg total) by mouth daily.    Marland Kitchen azaTHIOprine (IMURAN) 50 MG tablet TAKE 1 AND 1/2 TABLETS BY  MOUTH ONCE DAILY 60 tablet 5  . Cholecalciferol (VITAMIN D) 2000 UNITS tablet Take 2,000 Units by mouth daily.    . Coenzyme Q10 200 MG capsule Take 200 mg by mouth daily.    . Darbepoetin Alfa (ARANESP) 500 MCG/ML SOSY injection Inject 500 mcg into the skin every 21 ( twenty-one) days.    Marland Kitchen DEXILANT 60 MG capsule TAKE 1 CAPSULE BY MOUTH EVERY OTHER DAY 45 capsule 4  . diclofenac sodium (VOLTAREN) 1 % GEL Apply 1 application topically daily as needed (for pain). Apply to back and legs    . diltiazem (TIAZAC) 360 MG 24 hr capsule Take 1 capsule (360 mg total) by mouth daily. 30 capsule 1  . fish oil-omega-3 fatty acids 1000 MG capsule Take 1 g by mouth daily.    . fluticasone (FLONASE) 50 MCG/ACT nasal spray Place 2 sprays into both nostrils 2 (two) times daily.    . folic acid (FOLVITE) 800 MCG tablet Take 800 mcg by mouth daily.     . furosemide (LASIX) 20 MG tablet Take 20 mg by mouth daily as needed. For fluid retention    . Magnesium 400 MG TABS Take 1 tablet by mouth 2 (two) times daily.    . metoprolol (LOPRESSOR) 50 MG tablet Take 1 tablet 2 times daily on Saturday and Sunday and resume one and a half tablets 2 times daily on Monday 04/06/14. (Patient taking differently: Take 50 mg by mouth 2 (two) times daily. Take 1 tablet  2 times daily on Saturday and Sunday and resume one and a half tablets 2 times daily on Monday 04/06/14.)    . Multiple Vitamins-Minerals (MULTIVITAMIN WITH MINERALS) tablet Take 1 tablet by mouth daily.      . potassium chloride SA (K-DUR,KLOR-CON) 20 MEQ tablet TAKE 1 TABLET BY MOUTH ONCE DAILY 30 tablet 5  . predniSONE (DELTASONE) 5 MG tablet Take 7.5 mg by mouth daily.    Marland Kitchen spironolactone (ALDACTONE) 50 MG tablet TAKE 1 TABLET BY MOUTH DAILY 90 tablet 1  . Travoprost, BAK Free, (TRAVATAN) 0.004 % SOLN ophthalmic solution Place 1 drop into both eyes at bedtime.    . ursodiol (ACTIGALL) 250 MG tablet TAKE 2 TABLETS BY MOUTH TWICE DAILY (Patient taking differently: TAKE  300MG  BY MOUTH TWICE DAILY) 120 tablet 4  . vitamin B-12 (CYANOCOBALAMIN) 1000 MCG tablet Take 500 mcg by mouth daily.      No current facility-administered medications for this visit.   Facility-Administered Medications Ordered in Other Visits  Medication Dose Route Frequency Provider Last Rate Last Dose  . 0.9 %  sodium chloride infusion   Intravenous Continuous , PA-C   Stopped at 10/04/13 1230  . diphenhydrAMINE (BENADRYL) capsule 25 mg  25 mg Oral Q6H PRN 12/04/13, MD   25 mg at 05/13/11 07/13/11    Past Medical History  Diagnosis Date  . History of GI bleed     Associated with NSAIDS  . Iron deficiency anemia     Transfusion dependent  . DJD (degenerative joint disease)   . Essential hypertension, benign   . History of colitis   . Ileitis   . Pancytopenia   . Autoimmune hepatitis     With leukocytoclastic vasculitis  . Heart block AV second degree March 2013    a. Cardiology consult note 06/2013: "Question of previous second degree heart block, although review of cardiology notes indicates that this may have been actually blocked PACs when the patient was on verapamil."  . Bronchitis   . Steroid-induced myopathy   . Renal calculus 08/12/2011  . Rheumatoid arthritis(714.0)   . Colon ulcer 04/2010    NSAID related  . Gastric ulcer 04/2010    NSAID related  . Cancer   . UGI bleed 09/20/2011    Focal area of gastritis oozing of blood  . Gastric AVM 09/20/2011  . Candida esophagitis 09/20/2011  . Paroxysmal atrial fibrillation     Not on anticoag 2/2 hx of GIB/AVM  . Pericarditis     2D Echo EF 65%-70%  . CKD (chronic kidney disease), stage III   . Cholestatic hepatitis   . UTI (lower urinary tract infection)     history  . Paroxysmal atrial flutter     Not on anticoag 2/2 hx of GIB/AVM - dx 06/2013.  . Multifocal atrial tachycardia   . Hypertrophic cardiomyopathy     Echo 03/2011: severe LVH suggesting possble infiltrate cardiomyopathy or advanced  hypertensive heart disease, increased  echogenicity of the ventricular septum as well as the pericardium, EF >70% with end systolicmid-cavitary obliteration of the ventricle, stage 1 DD, mild MR, small IVC.  04/2011 Hypomagnesemia   . Chronic renal disease, stage 3, moderately decreased glomerular filtration rate between 30-59 mL/min/1.73 square meter 02/03/2014  . Anemia of chronic disease 11/25/2011  . Anemia of chronic renal failure, stage 3 (moderate) 02/03/2014  . Low back pain   . 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 Biatrial enlargement 04/04/2014    Past Surgical  History  Procedure Laterality Date  . Colonoscopy  04/2010  . Upper gastrointestinal endoscopy  04/2010  . Esophagogastroduodenoscopy  09/20/2011    Status post APC.  Marland Kitchen Esophagogastroduodenoscopy  09/20/2011    Procedure: ESOPHAGOGASTRODUODENOSCOPY (EGD);  Surgeon: Malissa Hippo, MD;  Location: [facility];  Service: Endoscopy;  Laterality: N/A;  . Cyst removal hand      Elbow    History   Social History  . Marital Status: Single    Spouse Name: N/A  . Number of Children: N/A  . Years of Education: N/A   Occupational History  . Not on file.   Social History Main Topics  . Smoking status: Current Some Day Smoker -- 0.25 packs/day    Types: Cigarettes    Start date: 04/10/1945  . Smokeless tobacco: Never Used  . Alcohol Use: No  . Drug Use: No  . Sexual Activity: Not Currently   Other Topics Concern  . Not on file   Social History Narrative     Filed Vitals:   05/09/14 1114  BP: 112/72  Pulse: 73  Height: 5\' 5"  (1.651 m)  Weight: 151 lb (68.493 kg)  SpO2: 99%    PHYSICAL EXAM General: NAD HEENT: Normal. Neck: No JVD, no thyromegaly. Lungs: Clear to auscultation bilaterally with normal respiratory effort. CV: Nondisplaced PMI. Irregular rhythm, normal S1/S2, no S3, no murmur. No pretibial or periankle edema.  Abdomen: Soft,no distention.  Neurologic: Alert and oriented.  Psych: Normal  affect. Skin: Stasis dermatitis of the legs. Musculoskeletal: No gross deformities. Extremities: No clubbing or cyanosis.   ECG: Most recent ECG reviewed.      ASSESSMENT AND PLAN: 1. Atrial flutter/fibrillation with RVR: HR currently well controlled on long-acting diltiazem 360 mg daily and metoprolol 75 mg twice daily. She is currently not a candidate for anticoagulation and takes 162 mg ASA.  However, if she were to have an EGD which did not demonstrate any remarkable abnormalities, one could consider a low-dose of an anticoagulant for CVA prevention.  2. Cardiomyopathy: Normal LV systolic function by most recent echocardiogram. No evidence of heart failure. Continue spironolactone 50 mg daily along with Lasix as needed.  3. Essential HTN: Well controlled. No changes.  Dispo: f/u 6 months.   , M.D., F.A.C.C.

## 2014-05-09 NOTE — Addendum Note (Signed)
Addended by: Kerney Elbe on: 05/09/2014 11:53 AM   Modules accepted: Level of Service

## 2014-05-20 ENCOUNTER — Other Ambulatory Visit (HOSPITAL_COMMUNITY): Payer: Self-pay

## 2014-05-26 ENCOUNTER — Ambulatory Visit (HOSPITAL_COMMUNITY): Payer: Medicare Other

## 2014-05-26 ENCOUNTER — Other Ambulatory Visit (HOSPITAL_COMMUNITY): Payer: Medicare Other

## 2014-05-27 ENCOUNTER — Encounter (HOSPITAL_BASED_OUTPATIENT_CLINIC_OR_DEPARTMENT_OTHER): Payer: Medicare Other

## 2014-05-27 ENCOUNTER — Other Ambulatory Visit (INDEPENDENT_AMBULATORY_CARE_PROVIDER_SITE_OTHER): Payer: Self-pay | Admitting: Internal Medicine

## 2014-05-27 ENCOUNTER — Encounter (HOSPITAL_COMMUNITY): Payer: Self-pay

## 2014-05-27 ENCOUNTER — Encounter (HOSPITAL_COMMUNITY): Payer: Medicare Other | Attending: Oncology

## 2014-05-27 VITALS — BP 105/55 | HR 68 | Temp 97.8°F | Resp 20

## 2014-05-27 DIAGNOSIS — D500 Iron deficiency anemia secondary to blood loss (chronic): Secondary | ICD-10-CM | POA: Insufficient documentation

## 2014-05-27 DIAGNOSIS — N289 Disorder of kidney and ureter, unspecified: Secondary | ICD-10-CM | POA: Insufficient documentation

## 2014-05-27 DIAGNOSIS — N183 Chronic kidney disease, stage 3 (moderate): Secondary | ICD-10-CM

## 2014-05-27 DIAGNOSIS — D649 Anemia, unspecified: Secondary | ICD-10-CM | POA: Diagnosis present

## 2014-05-27 DIAGNOSIS — D509 Iron deficiency anemia, unspecified: Secondary | ICD-10-CM | POA: Diagnosis present

## 2014-05-27 DIAGNOSIS — D61818 Other pancytopenia: Secondary | ICD-10-CM | POA: Insufficient documentation

## 2014-05-27 DIAGNOSIS — D638 Anemia in other chronic diseases classified elsewhere: Secondary | ICD-10-CM

## 2014-05-27 DIAGNOSIS — D631 Anemia in chronic kidney disease: Secondary | ICD-10-CM

## 2014-05-27 LAB — FERRITIN: FERRITIN: 24 ng/mL (ref 11–307)

## 2014-05-27 LAB — CBC WITH DIFFERENTIAL/PLATELET
BASOS PCT: 1 % (ref 0–1)
Basophils Absolute: 0 10*3/uL (ref 0.0–0.1)
Eosinophils Absolute: 0.1 10*3/uL (ref 0.0–0.7)
Eosinophils Relative: 3 % (ref 0–5)
HCT: 27.6 % — ABNORMAL LOW (ref 36.0–46.0)
Hemoglobin: 9 g/dL — ABNORMAL LOW (ref 12.0–15.0)
LYMPHS PCT: 22 % (ref 12–46)
Lymphs Abs: 0.7 10*3/uL (ref 0.7–4.0)
MCH: 26.6 pg (ref 26.0–34.0)
MCHC: 32.6 g/dL (ref 30.0–36.0)
MCV: 81.7 fL (ref 78.0–100.0)
MONO ABS: 0.5 10*3/uL (ref 0.1–1.0)
Monocytes Relative: 15 % — ABNORMAL HIGH (ref 3–12)
NEUTROS PCT: 58 % (ref 43–77)
Neutro Abs: 1.9 10*3/uL (ref 1.7–7.7)
Platelets: 115 10*3/uL — ABNORMAL LOW (ref 150–400)
RBC: 3.38 MIL/uL — ABNORMAL LOW (ref 3.87–5.11)
RDW: 18.6 % — ABNORMAL HIGH (ref 11.5–15.5)
WBC: 3.2 10*3/uL — ABNORMAL LOW (ref 4.0–10.5)

## 2014-05-27 LAB — COMPREHENSIVE METABOLIC PANEL
ALT: 12 U/L — ABNORMAL LOW (ref 14–54)
ANION GAP: 8 (ref 5–15)
AST: 58 U/L — ABNORMAL HIGH (ref 15–41)
Albumin: 2.8 g/dL — ABNORMAL LOW (ref 3.5–5.0)
Alkaline Phosphatase: 50 U/L (ref 38–126)
BUN: 20 mg/dL (ref 6–20)
CALCIUM: 9.9 mg/dL (ref 8.9–10.3)
CO2: 27 mmol/L (ref 22–32)
Chloride: 103 mmol/L (ref 101–111)
Creatinine, Ser: 1.19 mg/dL — ABNORMAL HIGH (ref 0.44–1.00)
GFR calc non Af Amer: 42 mL/min — ABNORMAL LOW (ref >60)
GFR, EST AFRICAN AMERICAN: 48 mL/min — AB (ref >60)
Glucose, Bld: 87 mg/dL (ref 65–99)
Potassium: 4.3 mmol/L (ref 3.5–5.1)
Sodium: 138 mmol/L (ref 135–145)
Total Bilirubin: 0.9 mg/dL (ref 0.3–1.2)
Total Protein: 6.2 g/dL — ABNORMAL LOW (ref 6.5–8.1)

## 2014-05-27 MED ORDER — DARBEPOETIN ALFA 60 MCG/0.3ML IJ SOSY
60.0000 ug | PREFILLED_SYRINGE | Freq: Once | INTRAMUSCULAR | Status: AC
  Administered 2014-05-27: 60 ug via SUBCUTANEOUS

## 2014-05-27 MED ORDER — DARBEPOETIN ALFA 60 MCG/0.3ML IJ SOSY
PREFILLED_SYRINGE | INTRAMUSCULAR | Status: AC
  Filled 2014-05-27: qty 0.3

## 2014-05-27 NOTE — Progress Notes (Signed)
LABS DRAWN

## 2014-05-27 NOTE — Patient Instructions (Signed)
[facility] at [facility] Discharge Instructions  RECOMMENDATIONS MADE BY THE CONSULTANT AND ANY TEST RESULTS WILL BE SENT TO YOUR REFERRING PHYSICIAN.  aranesp injection today Hemoglobin 9 Follow up as scheduled Call the clinic if you have any questions or concerns  Thank you for choosing  Cancer Center at [facility] to provide your oncology and hematology care.  To afford each patient quality time with our provider, please arrive at least 15 minutes before your scheduled appointment time.    You need to re-schedule your appointment should you arrive 10 or more minutes late.  We strive to give you quality time with our providers, and arriving late affects you and other patients whose appointments are after yours.  Also, if you no show three or more times for appointments you may be dismissed from the clinic at the providers discretion.     Again, thank you for choosing [facility].  Our hope is that these requests will decrease the amount of time that you wait before being seen by our physicians.       _____________________________________________________________  Should you have questions after your visit to [facility], please contact our office at [phone] between the hours of 8:30 a.m. and 4:30 p.m.  Voicemails left after 4:30 p.m. will not be returned until the following business day.  For prescription refill requests, have your pharmacy contact our office.

## 2014-05-27 NOTE — Progress Notes (Signed)
Eileen Santana's reason for visit today is for an injection and labs as scheduled per MD orders.  Labs were drawn prior to administration of ordered medication.  [name] also received aranesp 60 mcg in abdomen sq per MD orders; see [facility] for administration details.  Eileen Santana tolerated all procedures well and without incident; questions were answered and patient was discharged.

## 2014-05-28 ENCOUNTER — Other Ambulatory Visit (HOSPITAL_COMMUNITY): Payer: Self-pay | Admitting: Oncology

## 2014-05-28 DIAGNOSIS — D500 Iron deficiency anemia secondary to blood loss (chronic): Secondary | ICD-10-CM

## 2014-05-29 ENCOUNTER — Encounter (HOSPITAL_BASED_OUTPATIENT_CLINIC_OR_DEPARTMENT_OTHER): Payer: Medicare Other

## 2014-05-29 ENCOUNTER — Encounter (HOSPITAL_COMMUNITY): Payer: Self-pay

## 2014-05-29 VITALS — BP 100/58 | HR 64 | Temp 98.1°F | Resp 16

## 2014-05-29 DIAGNOSIS — D500 Iron deficiency anemia secondary to blood loss (chronic): Secondary | ICD-10-CM

## 2014-05-29 MED ORDER — SODIUM CHLORIDE 0.9 % IV SOLN
INTRAVENOUS | Status: DC
  Administered 2014-05-29: 11:00:00 via INTRAVENOUS

## 2014-05-29 MED ORDER — SODIUM CHLORIDE 0.9 % IV SOLN
510.0000 mg | Freq: Once | INTRAVENOUS | Status: AC
  Administered 2014-05-29: 510 mg via INTRAVENOUS
  Filled 2014-05-29: qty 17

## 2014-05-29 NOTE — Patient Instructions (Signed)
[facility] at [facility] Discharge Instructions  RECOMMENDATIONS MADE BY THE CONSULTANT AND ANY TEST RESULTS WILL BE SENT TO YOUR REFERRING PHYSICIAN.  Iron infusion today. Return as scheduled for blood work and office visit.  Thank you for choosing [facility] at [facility] to provide your oncology and hematology care.  To afford each patient quality time with our provider, please arrive at least 15 minutes before your scheduled appointment time.    You need to re-schedule your appointment should you arrive 10 or more minutes late.  We strive to give you quality time with our providers, and arriving late affects you and other patients whose appointments are after yours.  Also, if you no show three or more times for appointments you may be dismissed from the clinic at the providers discretion.     Again, thank you for choosing [facility]  Our hope is that these requests will decrease the amount of time that you wait before being seen by our physicians.       _____________________________________________________________  Should you have questions after your visit to [facility], please contact our office at [phone] between the hours of 8:30 a.m. and 4:30 p.m.  Voicemails left after 4:30 p.m. will not be returned until the following business day.  For prescription refill requests, have your pharmacy contact our office.

## 2014-06-17 ENCOUNTER — Other Ambulatory Visit (HOSPITAL_COMMUNITY): Payer: Self-pay

## 2014-06-17 DIAGNOSIS — D61818 Other pancytopenia: Secondary | ICD-10-CM

## 2014-06-17 DIAGNOSIS — D500 Iron deficiency anemia secondary to blood loss (chronic): Secondary | ICD-10-CM

## 2014-06-23 ENCOUNTER — Encounter (INDEPENDENT_AMBULATORY_CARE_PROVIDER_SITE_OTHER): Payer: Self-pay | Admitting: Internal Medicine

## 2014-06-23 ENCOUNTER — Encounter (HOSPITAL_COMMUNITY): Payer: Medicare Other | Attending: Oncology

## 2014-06-23 ENCOUNTER — Encounter (HOSPITAL_BASED_OUTPATIENT_CLINIC_OR_DEPARTMENT_OTHER): Payer: Medicare Other

## 2014-06-23 ENCOUNTER — Other Ambulatory Visit (HOSPITAL_COMMUNITY): Payer: Self-pay | Admitting: Oncology

## 2014-06-23 ENCOUNTER — Telehealth (INDEPENDENT_AMBULATORY_CARE_PROVIDER_SITE_OTHER): Payer: Self-pay | Admitting: *Deleted

## 2014-06-23 ENCOUNTER — Other Ambulatory Visit (INDEPENDENT_AMBULATORY_CARE_PROVIDER_SITE_OTHER): Payer: Self-pay | Admitting: *Deleted

## 2014-06-23 ENCOUNTER — Ambulatory Visit (INDEPENDENT_AMBULATORY_CARE_PROVIDER_SITE_OTHER): Payer: Medicare Other | Admitting: Internal Medicine

## 2014-06-23 VITALS — BP 108/66 | HR 72 | Temp 98.4°F | Resp 18 | Ht 66.0 in | Wt 154.2 lb

## 2014-06-23 VITALS — BP 115/51 | HR 51

## 2014-06-23 DIAGNOSIS — K922 Gastrointestinal hemorrhage, unspecified: Secondary | ICD-10-CM

## 2014-06-23 DIAGNOSIS — N183 Chronic kidney disease, stage 3 (moderate): Secondary | ICD-10-CM

## 2014-06-23 DIAGNOSIS — D509 Iron deficiency anemia, unspecified: Secondary | ICD-10-CM | POA: Diagnosis present

## 2014-06-23 DIAGNOSIS — K219 Gastro-esophageal reflux disease without esophagitis: Secondary | ICD-10-CM

## 2014-06-23 DIAGNOSIS — D631 Anemia in chronic kidney disease: Secondary | ICD-10-CM

## 2014-06-23 DIAGNOSIS — N289 Disorder of kidney and ureter, unspecified: Secondary | ICD-10-CM | POA: Diagnosis present

## 2014-06-23 DIAGNOSIS — D500 Iron deficiency anemia secondary to blood loss (chronic): Secondary | ICD-10-CM | POA: Insufficient documentation

## 2014-06-23 DIAGNOSIS — D638 Anemia in other chronic diseases classified elsewhere: Secondary | ICD-10-CM

## 2014-06-23 DIAGNOSIS — R195 Other fecal abnormalities: Secondary | ICD-10-CM

## 2014-06-23 DIAGNOSIS — D61818 Other pancytopenia: Secondary | ICD-10-CM | POA: Diagnosis present

## 2014-06-23 DIAGNOSIS — I639 Cerebral infarction, unspecified: Secondary | ICD-10-CM

## 2014-06-23 DIAGNOSIS — K754 Autoimmune hepatitis: Secondary | ICD-10-CM | POA: Diagnosis not present

## 2014-06-23 DIAGNOSIS — D649 Anemia, unspecified: Secondary | ICD-10-CM | POA: Insufficient documentation

## 2014-06-23 LAB — CBC WITH DIFFERENTIAL/PLATELET
Basophils Absolute: 0 10*3/uL (ref 0.0–0.1)
Basophils Relative: 1 % (ref 0–1)
EOS ABS: 0.1 10*3/uL (ref 0.0–0.7)
EOS PCT: 3 % (ref 0–5)
HEMATOCRIT: 23.4 % — AB (ref 36.0–46.0)
Hemoglobin: 7.5 g/dL — ABNORMAL LOW (ref 12.0–15.0)
LYMPHS PCT: 16 % (ref 12–46)
Lymphs Abs: 0.4 10*3/uL — ABNORMAL LOW (ref 0.7–4.0)
MCH: 27 pg (ref 26.0–34.0)
MCHC: 32.1 g/dL (ref 30.0–36.0)
MCV: 84.2 fL (ref 78.0–100.0)
MONOS PCT: 14 % — AB (ref 3–12)
Monocytes Absolute: 0.4 10*3/uL (ref 0.1–1.0)
Neutro Abs: 1.9 10*3/uL (ref 1.7–7.7)
Neutrophils Relative %: 67 % (ref 43–77)
Platelets: 95 10*3/uL — ABNORMAL LOW (ref 150–400)
RBC: 2.78 MIL/uL — ABNORMAL LOW (ref 3.87–5.11)
RDW: 22 % — ABNORMAL HIGH (ref 11.5–15.5)
SMEAR REVIEW: DECREASED
WBC: 2.8 10*3/uL — ABNORMAL LOW (ref 4.0–10.5)

## 2014-06-23 LAB — FERRITIN: FERRITIN: 62 ng/mL (ref 11–307)

## 2014-06-23 LAB — PREPARE RBC (CROSSMATCH)

## 2014-06-23 MED ORDER — DARBEPOETIN ALFA 60 MCG/0.3ML IJ SOSY
60.0000 ug | PREFILLED_SYRINGE | Freq: Once | INTRAMUSCULAR | Status: AC
  Administered 2014-06-23: 60 ug via SUBCUTANEOUS

## 2014-06-23 MED ORDER — DARBEPOETIN ALFA 60 MCG/0.3ML IJ SOSY
PREFILLED_SYRINGE | INTRAMUSCULAR | Status: AC
  Filled 2014-06-23: qty 0.3

## 2014-06-23 NOTE — Patient Instructions (Signed)
Esophagogastroduodenoscopy and colonoscopy be scheduled.

## 2014-06-23 NOTE — Progress Notes (Signed)
HgB 7.5, MD notified.  Patient to return Wednesday for blood transfusion.  Patient verbalized understanding.Eileen Santana  presents today for injection per MD orders. Aranesp administered SQ in left Abdomen. Administration without incident. Patient tolerated well.

## 2014-06-23 NOTE — Telephone Encounter (Signed)
Patient needs trilyte 

## 2014-06-23 NOTE — Progress Notes (Signed)
Presenting complaint;  Follow-up for autoimmune hepatitis GERD and IDA.  Subjective:  Patient is 81 year old African-American female who is here for scheduled visit. She was last seen on 03/18/2014. She is accompanied by her daughter [name]. She has 2 complaints. She is been gradually getting weaker and weaker. Weakness started 3-4 weeks ago. She lab studies at oncology clinic and her hemoglobin was noted to be 7.5 earlier today. She is scheduled to receive 2 units of PRBCs on 06/25/2014. She received 60 g of Aranesp earlier today. She has not experienced melena or rectal bleeding. She also denies vaginal bleeding or hematuria. Heartburn is well controlled with therapy. She denies nausea vomiting or dysphagia. She has good appetite. She has noted increase in lower extremity edema over the last couple weeks. Her other complaint is one of back pain. She denies taking OTC NSAIDs.  Prior GI studies as follows; She had EGD and colonoscopy in April 2012 and she was admitted with hemoglobin of 4.6 g. EGD revealed 2 gastric ulcers and gastric erosions. Colonoscopy revealed ulcerated ileocecal valve.. Biopsy reveals benign ulcer. H. pylori serology was negative. Injury to upper GI tract as well as ileocecal valve was felt to be source of GI blood loss caused by NSAIDs.  She had EGD on 09/20/2011 when she presented with melena and anemia. She was found to have esophageal candidiasis focal gastritis with wheezing/active bleeding and she also had single gastric AV malformation. These lesions were ablated with APC.   Current Medications: Outpatient Encounter Prescriptions as of 06/23/2014  Medication Sig  . acetaminophen (TYLENOL) 325 MG tablet Take 650 mg by mouth every 6 (six) hours as needed. For pain  . aspirin 81 MG chewable tablet Chew 2 tablets (162 mg total) by mouth daily.  Marland Kitchen azaTHIOprine (IMURAN) 50 MG tablet TAKE 1 AND 1/2 TABLETS BY MOUTH ONCE DAILY  . Cholecalciferol (VITAMIN D) 2000 UNITS  tablet Take 2,000 Units by mouth daily.  . Coenzyme Q10 200 MG capsule Take 200 mg by mouth daily.  . Darbepoetin Alfa (ARANESP) 500 MCG/ML SOSY injection Inject 500 mcg into the skin every 21 ( twenty-one) days.  Marland Kitchen DEXILANT 60 MG capsule TAKE 1 CAPSULE BY MOUTH EVERY OTHER DAY  . diltiazem (TIAZAC) 360 MG 24 hr capsule Take 1 capsule (360 mg total) by mouth daily.  . Ergocalciferol (VITAMIN D2) 2000 UNITS TABS Take by mouth daily.  . fish oil-omega-3 fatty acids 1000 MG capsule Take 1 g by mouth daily.  . fluticasone (FLONASE) 50 MCG/ACT nasal spray Place 2 sprays into both nostrils 2 (two) times daily.  . folic acid (FOLVITE) 800 MCG tablet Take 800 mcg by mouth daily.   . furosemide (LASIX) 20 MG tablet Take 20 mg by mouth daily as needed. For fluid retention  . lidocaine-prilocaine (EMLA) cream Apply 1 application topically as needed.   . Magnesium 400 MG TABS Take 1 tablet by mouth 2 (two) times daily.  . metoprolol (LOPRESSOR) 50 MG tablet Take 1 tablet 2 times daily on Saturday and Sunday and resume one and a half tablets 2 times daily on Monday 04/06/14. (Patient taking differently: Take 50 mg by mouth 2 (two) times daily. )  . Multiple Vitamins-Minerals (MULTIVITAMIN WITH MINERALS) tablet Take 1 tablet by mouth daily.    . potassium chloride SA (K-DUR,KLOR-CON) 20 MEQ tablet TAKE 1 TABLET BY MOUTH ONCE DAILY (Patient taking differently: Patient takes Monday, Wednesday, Friday)  . predniSONE (DELTASONE) 5 MG tablet Take 7.5 mg by mouth daily.  Marland Kitchen  spironolactone (ALDACTONE) 50 MG tablet TAKE 1 TABLET BY MOUTH DAILY  . Travoprost, BAK Free, (TRAVATAN) 0.004 % SOLN ophthalmic solution Place 1 drop into both eyes at bedtime.  . ursodiol (ACTIGALL) 300 MG capsule Take 300 mg by mouth 2 (two) times daily.   . vitamin B-12 (CYANOCOBALAMIN) 1000 MCG tablet Take 500 mcg by mouth daily.   . [DISCONTINUED] diclofenac sodium (VOLTAREN) 1 % GEL Apply 1 application topically daily as needed (for pain).  Apply to back and legs  . [DISCONTINUED] ursodiol (ACTIGALL) 250 MG tablet TAKE 2 TABLETS BY MOUTH TWICE DAILY (Patient not taking: Reported on 06/23/2014)   Facility-Administered Encounter Medications as of 06/23/2014  Medication  . 0.9 %  sodium chloride infusion  . diphenhydrAMINE (BENADRYL) capsule 25 mg     Objective: Blood pressure 108/66, pulse 72, temperature 98.4 F (36.9 C), temperature source Oral, resp. rate 18, height 5\' 6"  (1.676 m), weight 154 lb 3.2 oz (69.945 kg). Patient is alert and in no acute distress. She has mild round facies. Conjunctiva is pale. Sclera is nonicteric Oropharyngeal mucosa is normal. No neck masses or thyromegaly noted. Cardiac exam with regular rhythm normal S1 and S2. Faint systolic ejection murmur noted at left sternal border. Lungs are clear to auscultation. Abdomen is soft and nontender without organomegaly or masses.  Rectal examination reveals black stool and it is very strongly heme positive. 2+ pitting edema noted involving both legs.  Labs/studies Results:   Recent Labs  06/23/14 1129  WBC 2.8*  HGB 7.5*  HCT 23.4*  PLT 95*    Lab data from 05/27/2014 Bilirubin 0.9, AP 50, AST 58, ALT 12, total protein 6.2 and albumin 2.8.   Assessment:  #1.Acute on chronic anemia. Chronic anemia appears to be multifactorial including iron deficiency anemia. Her stool to this black and strongly guaiac positive hence reason for drop in H&H. She did also been losing blood from nonhealing colonic ulcer. Repeat evaluation is warranted. #2. Autoimmune hepatitis. AST remains mildly elevated but ALT is normal. Elevated AST is possibly from nonhepatic source.  #3. GERD. Heartburn is well controlled with therapy.   Plan:  Transfusion with 2 units of PRBCs as recommended and planned by Dr. 05/29/2014 and associates. Diagnostic esophagogastroduodenoscopy and colonoscopy to be arranged in the future.

## 2014-06-24 MED ORDER — PEG 3350-KCL-NA BICARB-NACL 420 G PO SOLR: 4000.0000 mL | Freq: Once | ORAL | Status: DC

## 2014-06-24 NOTE — Progress Notes (Signed)
LABS DRAWN

## 2014-06-25 ENCOUNTER — Encounter (HOSPITAL_COMMUNITY): Payer: Self-pay

## 2014-06-25 ENCOUNTER — Encounter (HOSPITAL_BASED_OUTPATIENT_CLINIC_OR_DEPARTMENT_OTHER): Payer: Medicare Other

## 2014-06-25 VITALS — BP 98/47 | HR 66 | Temp 97.9°F | Resp 18

## 2014-06-25 DIAGNOSIS — D631 Anemia in chronic kidney disease: Secondary | ICD-10-CM | POA: Diagnosis not present

## 2014-06-25 DIAGNOSIS — N183 Chronic kidney disease, stage 3 (moderate): Secondary | ICD-10-CM | POA: Diagnosis present

## 2014-06-25 DIAGNOSIS — D500 Iron deficiency anemia secondary to blood loss (chronic): Secondary | ICD-10-CM | POA: Diagnosis not present

## 2014-06-25 DIAGNOSIS — K922 Gastrointestinal hemorrhage, unspecified: Secondary | ICD-10-CM

## 2014-06-25 MED ORDER — SODIUM CHLORIDE 0.9 % IJ SOLN: 10.0000 mL | INTRAMUSCULAR | Status: DC | PRN

## 2014-06-25 MED ORDER — DIPHENHYDRAMINE HCL 25 MG PO CAPS
25.0000 mg | ORAL_CAPSULE | Freq: Once | ORAL | Status: AC
  Administered 2014-06-25: 25 mg via ORAL
  Filled 2014-06-25: qty 1

## 2014-06-25 MED ORDER — ACETAMINOPHEN 325 MG PO TABS
650.0000 mg | ORAL_TABLET | Freq: Once | ORAL | Status: AC
  Administered 2014-06-25: 650 mg via ORAL
  Filled 2014-06-25: qty 2

## 2014-06-25 MED ORDER — SODIUM CHLORIDE 0.9 % IV SOLN
250.0000 mL | Freq: Once | INTRAVENOUS | Status: AC
  Administered 2014-06-25: 250 mL via INTRAVENOUS

## 2014-06-25 NOTE — Progress Notes (Signed)
1445:  Tolerated transfusion w/o adverse reaction. VSS.  A&ox4; in no distress.  Discharged via wheelchair in c/o daughter for transport home.

## 2014-06-25 NOTE — Patient Instructions (Signed)
[facility] at [facility] Discharge Instructions  RECOMMENDATIONS MADE BY THE CONSULTANT AND ANY TEST RESULTS WILL BE SENT TO YOUR REFERRING PHYSICIAN.  Today you received 2 units of blood. Return as scheduled for lab work, injections, and office visit.   Thank you for choosing  Cancer Center at [facility] to provide your oncology and hematology care.  To afford each patient quality time with our provider, please arrive at least 15 minutes before your scheduled appointment time.    You need to re-schedule your appointment should you arrive 10 or more minutes late.  We strive to give you quality time with our providers, and arriving late affects you and other patients whose appointments are after yours.  Also, if you no show three or more times for appointments you may be dismissed from the clinic at the providers discretion.     Again, thank you for choosing [facility].  Our hope is that these requests will decrease the amount of time that you wait before being seen by our physicians.       _____________________________________________________________  Should you have questions after your visit to [facility], please contact our office at [phone] between the hours of 8:30 a.m. and 4:30 p.m.  Voicemails left after 4:30 p.m. will not be returned until the following business day.  For prescription refill requests, have your pharmacy contact our office.

## 2014-06-26 LAB — TYPE AND SCREEN
ABO/RH(D): A POS
Antibody Screen: NEGATIVE
UNIT DIVISION: 0
UNIT DIVISION: 0
Unit division: 0

## 2014-07-04 ENCOUNTER — Encounter (HOSPITAL_COMMUNITY): Payer: Self-pay | Admitting: *Deleted

## 2014-07-04 ENCOUNTER — Encounter (HOSPITAL_COMMUNITY)
Admission: RE | Disposition: A | Discharge status: Home / Self Care | Payer: Self-pay | Source: Ambulatory Visit | Attending: Internal Medicine

## 2014-07-04 ENCOUNTER — Ambulatory Visit (HOSPITAL_COMMUNITY)
Admission: RE | Admit: 2014-07-04 | Discharge: 2014-07-04 | Disposition: A | Discharge status: Home / Self Care | Payer: Medicare Other | Source: Ambulatory Visit | Attending: Internal Medicine | Admitting: Internal Medicine

## 2014-07-04 DIAGNOSIS — K449 Diaphragmatic hernia without obstruction or gangrene: Secondary | ICD-10-CM | POA: Diagnosis not present

## 2014-07-04 DIAGNOSIS — M1990 Unspecified osteoarthritis, unspecified site: Secondary | ICD-10-CM | POA: Diagnosis not present

## 2014-07-04 DIAGNOSIS — Z7982 Long term (current) use of aspirin: Secondary | ICD-10-CM | POA: Insufficient documentation

## 2014-07-04 DIAGNOSIS — K3189 Other diseases of stomach and duodenum: Secondary | ICD-10-CM | POA: Diagnosis not present

## 2014-07-04 DIAGNOSIS — R195 Other fecal abnormalities: Secondary | ICD-10-CM | POA: Diagnosis not present

## 2014-07-04 DIAGNOSIS — B3781 Candidal esophagitis: Secondary | ICD-10-CM | POA: Diagnosis not present

## 2014-07-04 DIAGNOSIS — F17210 Nicotine dependence, cigarettes, uncomplicated: Secondary | ICD-10-CM | POA: Diagnosis not present

## 2014-07-04 DIAGNOSIS — K921 Melena: Secondary | ICD-10-CM | POA: Insufficient documentation

## 2014-07-04 DIAGNOSIS — G8929 Other chronic pain: Secondary | ICD-10-CM | POA: Diagnosis not present

## 2014-07-04 DIAGNOSIS — I422 Other hypertrophic cardiomyopathy: Secondary | ICD-10-CM | POA: Insufficient documentation

## 2014-07-04 DIAGNOSIS — K644 Residual hemorrhoidal skin tags: Secondary | ICD-10-CM | POA: Insufficient documentation

## 2014-07-04 DIAGNOSIS — Z888 Allergy status to other drugs, medicaments and biological substances status: Secondary | ICD-10-CM | POA: Diagnosis not present

## 2014-07-04 DIAGNOSIS — Z885 Allergy status to narcotic agent status: Secondary | ICD-10-CM | POA: Insufficient documentation

## 2014-07-04 DIAGNOSIS — M069 Rheumatoid arthritis, unspecified: Secondary | ICD-10-CM | POA: Insufficient documentation

## 2014-07-04 DIAGNOSIS — Z882 Allergy status to sulfonamides status: Secondary | ICD-10-CM | POA: Diagnosis not present

## 2014-07-04 DIAGNOSIS — N183 Chronic kidney disease, stage 3 (moderate): Secondary | ICD-10-CM | POA: Diagnosis not present

## 2014-07-04 DIAGNOSIS — Z91041 Radiographic dye allergy status: Secondary | ICD-10-CM | POA: Diagnosis not present

## 2014-07-04 DIAGNOSIS — D638 Anemia in other chronic diseases classified elsewhere: Secondary | ICD-10-CM | POA: Insufficient documentation

## 2014-07-04 DIAGNOSIS — K5730 Diverticulosis of large intestine without perforation or abscess without bleeding: Secondary | ICD-10-CM | POA: Insufficient documentation

## 2014-07-04 DIAGNOSIS — Z8673 Personal history of transient ischemic attack (TIA), and cerebral infarction without residual deficits: Secondary | ICD-10-CM | POA: Insufficient documentation

## 2014-07-04 DIAGNOSIS — K648 Other hemorrhoids: Secondary | ICD-10-CM

## 2014-07-04 DIAGNOSIS — K922 Gastrointestinal hemorrhage, unspecified: Secondary | ICD-10-CM

## 2014-07-04 DIAGNOSIS — D631 Anemia in chronic kidney disease: Secondary | ICD-10-CM | POA: Diagnosis not present

## 2014-07-04 DIAGNOSIS — I480 Paroxysmal atrial fibrillation: Secondary | ICD-10-CM | POA: Insufficient documentation

## 2014-07-04 DIAGNOSIS — I4892 Unspecified atrial flutter: Secondary | ICD-10-CM | POA: Diagnosis not present

## 2014-07-04 DIAGNOSIS — E8809 Other disorders of plasma-protein metabolism, not elsewhere classified: Secondary | ICD-10-CM

## 2014-07-04 DIAGNOSIS — Z79899 Other long term (current) drug therapy: Secondary | ICD-10-CM | POA: Insufficient documentation

## 2014-07-04 DIAGNOSIS — K754 Autoimmune hepatitis: Secondary | ICD-10-CM | POA: Insufficient documentation

## 2014-07-04 DIAGNOSIS — M545 Low back pain: Secondary | ICD-10-CM | POA: Insufficient documentation

## 2014-07-04 DIAGNOSIS — D500 Iron deficiency anemia secondary to blood loss (chronic): Secondary | ICD-10-CM | POA: Diagnosis not present

## 2014-07-04 DIAGNOSIS — D61818 Other pancytopenia: Secondary | ICD-10-CM | POA: Diagnosis not present

## 2014-07-04 DIAGNOSIS — I129 Hypertensive chronic kidney disease with stage 1 through stage 4 chronic kidney disease, or unspecified chronic kidney disease: Secondary | ICD-10-CM | POA: Diagnosis not present

## 2014-07-04 HISTORY — DX: Personal history of pneumonia (recurrent): Z87.01

## 2014-07-04 HISTORY — PX: COLONOSCOPY: SHX5424

## 2014-07-04 HISTORY — PX: ESOPHAGOGASTRODUODENOSCOPY: SHX5428

## 2014-07-04 LAB — HEMOGLOBIN AND HEMATOCRIT, BLOOD
HEMATOCRIT: 27.7 % — AB (ref 36.0–46.0)
HEMOGLOBIN: 9 g/dL — AB (ref 12.0–15.0)

## 2014-07-04 LAB — ALBUMIN: Albumin: 2.5 g/dL — ABNORMAL LOW (ref 3.5–5.0)

## 2014-07-04 SURGERY — COLONOSCOPY
Anesthesia: Moderate Sedation

## 2014-07-04 MED ORDER — BUTAMBEN-TETRACAINE-BENZOCAINE 2-2-14 % EX AERO
INHALATION_SPRAY | CUTANEOUS | Status: DC | PRN
  Administered 2014-07-04: 2 via TOPICAL

## 2014-07-04 MED ORDER — SODIUM CHLORIDE 0.9 % IV SOLN
INTRAVENOUS | Status: DC
  Administered 2014-07-04: 07:00:00 via INTRAVENOUS

## 2014-07-04 MED ORDER — MEPERIDINE HCL 50 MG/ML IJ SOLN
INTRAMUSCULAR | Status: DC | PRN
  Administered 2014-07-04: 15 mg via INTRAVENOUS

## 2014-07-04 MED ORDER — MIDAZOLAM HCL 5 MG/5ML IJ SOLN
INTRAMUSCULAR | Status: DC | PRN
  Administered 2014-07-04: 1 mg via INTRAVENOUS
  Administered 2014-07-04: 2 mg via INTRAVENOUS

## 2014-07-04 MED ORDER — MIDAZOLAM HCL 5 MG/5ML IJ SOLN
INTRAMUSCULAR | Status: AC
  Filled 2014-07-04: qty 10

## 2014-07-04 MED ORDER — STERILE WATER FOR IRRIGATION IR SOLN
Status: DC | PRN
  Administered 2014-07-04: 07:00:00

## 2014-07-04 MED ORDER — NYSTATIN 100000 UNIT/ML MT SUSP: 5.0000 mL | Freq: Four times a day (QID) | OROMUCOSAL | Status: DC

## 2014-07-04 MED ORDER — MEPERIDINE HCL 50 MG/ML IJ SOLN
INTRAMUSCULAR | Status: AC
  Filled 2014-07-04: qty 1

## 2014-07-04 NOTE — Op Note (Addendum)
EGD AND COLONOSCOPY PROCEDURE REPORT  PATIENT:  Eileen Santana  MR#:  [id number] Birthdate:  1930/04/11, 81 y.o., female Endoscopist:  Dr. Malissa Hippo, MD  Procedure Date: 07/04/2014  Procedure:   EGD & Colonoscopy  Indications:  Patient is 81 year old African-American female with multiple medical problems including autoimmune hepatitis as well as anemia of chronic disease who was noted to have drop in her hemoglobin requiring 2 units of PRBCs. She was seen in the office last week and noted to have black tarry stool is strongly heme positive. She is therefore returning for diagnostic evaluation. She has history of NSAID induced GI injury in the form of gastric ulcers and ulcer at ileocecal valve back in April 2012. Presently she is on low-dose aspirin but does not take other OTC NSAIDs.            Informed Consent:  The risks, benefits, alternatives & imponderables which include, but are not limited to, bleeding, infection, perforation, drug reaction and potential missed lesion have been reviewed.  The potential for biopsy, lesion removal, esophageal dilation, etc. have also been discussed.  Questions have been answered.  All parties agreeable.  Please see history & physical in medical record for more information.  Medications:  Demerol 15 mg IV Versed 3 mg IV Cetacaine spray topically for oropharyngeal anesthesia  EGD  Description of procedure:  The endoscope was introduced through the mouth and advanced to the second portion of the duodenum without difficulty or limitations. The mucosal surfaces were surveyed very carefully during advancement of the scope and upon withdrawal.  Findings:  Esophagus: Scant patchy cheesy exudate noted coating esophageal mucosa. Brushing taken for KOH prep. GEJ:  37 cm Hiatus:  39 cm Stomach:  Stomach was empty and distended very well with insufflation. Folds in the proximal stomach were normal. Examination mucosa gastric body revealed mosaic pattern.  Antral mucosa was normal. Pyloric channel was patent. Angularis similarly was unremarkable. No fundal varices identified. Duodenum:  Normal bulbar and post bulbar mucosa.  Therapeutic/Diagnostic Maneuvers Performed:   Brushing taken for KOH prep.  COLONOSCOPY Description of procedure:  After a digital rectal exam was performed, that colonoscope was advanced from the anus through the rectum and colon to the area of the cecum, ileocecal valve and appendiceal orifice. The cecum was deeply intubated. These structures were well-seen and photographed for the record. From the level of the cecum and ileocecal valve, the scope was slowly and cautiously withdrawn. The mucosal surfaces were carefully surveyed utilizing scope tip to flexion to facilitate fold flattening as needed. The scope was pulled down into the rectum where a thorough exam including retroflexion was performed.  Findings:   Prep satisfactory. Previously noted ulcerated ileocecal valve had completely healed. Scattered diverticula noted throughout the colon but these were few in number. Normal rectal mucosa. Small hemorrhoids noted below the dentate line along with anal papillae.  Therapeutic/Diagnostic Maneuvers Performed:  None  Complications:  None  Cecal Withdrawal Time:  9 minutes  Impression:  EGD findings; Mild Candida esophagitis. Brushing taken for KOH prep. Small sliding hiatal hernia. Portal gastropathy without evidence of esophageal or gastric varices.  Colonoscopy findings. Pancolonic diverticulosis. Small external hemorrhoids and anal papillae.  Comment; Acute GI bleed may have been secondary to colonic diverticulosis. Will need to rule out small bowel source of blood loss.  Recommendations:  Check H&H and serum albumen today. Mycostatin suspension 500,000 units swish and swallow 4 times a day for 12 days. Patient will return for small bowel  given capsule study next week.  REHMAN,NAJEEB U  07/04/2014 8:45  AM  CC: Dr. [name], MD & Dr. Bonnetta Barry ref. provider found CC: [name], PA-C

## 2014-07-04 NOTE — Discharge Instructions (Signed)
Resume usual medications and diet. Mycostatin suspension 1 teaspoonful swish and swallow 4 times a day until prescription runs out. No driving for 24 hours. Physician will call with results of blood test.   Colonoscopy, Care After These instructions give you information on caring for yourself after your procedure. Your doctor may also give you more specific instructions. Call your doctor if you have any problems or questions after your procedure. HOME CARE  Do not drive for 24 hours.  Do not sign important papers or use machinery for 24 hours.  You may shower.  You may go back to your usual activities, but go slower for the first 24 hours.  Take rest breaks often during the first 24 hours.  Walk around or use warm packs on your belly (abdomen) if you have belly cramping or gas.  Drink enough fluids to keep your pee (urine) clear or pale yellow.  Resume your normal diet. Avoid heavy or fried foods.  Avoid drinking alcohol for 24 hours or as told by your doctor.  Only take medicines as told by your doctor. If a tissue sample (biopsy) was taken during the procedure:   Do not take aspirin or blood thinners for 7 days, or as told by your doctor.  Do not drink alcohol for 7 days, or as told by your doctor.  Eat soft foods for the first 24 hours. GET HELP IF: You still have a small amount of blood in your poop (stool) 2-3 days after the procedure. GET HELP RIGHT AWAY IF:  You have more than a small amount of blood in your poop.  You see clumps of tissue (blood clots) in your poop.  Your belly is puffy (swollen).  You feel sick to your stomach (nauseous) or throw up (vomit).  You have a fever.  You have belly pain that gets worse and medicine does not help. MAKE SURE YOU:  Understand these instructions.  Will watch your condition.  Will get help right away if you are not doing well or get worse. Document Released: 01/22/2010 Document Revised: 12/25/2012 Document  Reviewed: 08/27/2012 [facility] Patient Information 2015 [place], [place]. This information is not intended to replace advice given to you by your health care provider. Make sure you discuss any questions you have with your health care provider.   Esophagogastroduodenoscopy Care After Refer to this sheet in the next few weeks. These instructions provide you with information on caring for yourself after your procedure. Your caregiver may also give you more specific instructions. Your treatment has been planned according to current medical practices, but problems sometimes occur. Call your caregiver if you have any problems or questions after your procedure.  HOME CARE INSTRUCTIONS  Do not eat or drink anything until the numbing medicine (local anesthetic) has worn off and your gag reflex has returned. You will know that the local anesthetic has worn off when you can swallow comfortably.  Do not drive for 12 hours after the procedure or as directed by your caregiver.  Only take medicines as directed by your caregiver. SEEK MEDICAL CARE IF:   You cannot stop coughing.  You are not urinating at all or less than usual. SEEK IMMEDIATE MEDICAL CARE IF:  You have difficulty swallowing.  You cannot eat or drink.  You have worsening throat or chest pain.  You have dizziness, lightheadedness, or you faint.  You have nausea or vomiting.  You have chills.  You have a fever.  You have severe abdominal pain.  You  have black, tarry, or bloody stools. Document Released: 12/07/2011 Document Reviewed: 12/07/2011 [facility] Patient Information 2015 [place], [place]. This information is not intended to replace advice given to you by your health care provider. Make sure you discuss any questions you have with your health care provider.

## 2014-07-04 NOTE — H&P (Signed)
Eileen Santana is an 81 y.o. female.   Chief Complaint: Patient is here for EGD and colonoscopy. HPI: Patient is 81 year old African-American female with multiple medical problems who has chronic anemia. She has history of file deficiency. She was seen in the office last week and noted to have hemoglobin of 7.5 g. Rectal examination revealed black tarry stool which is guaiac positive. She has received 2 units of PRBCs via oncology clinic. She is returning for endoscopic evaluation. She says heartburns well controlled with therapy. She denies dysphagia nausea vomiting abdominal pain. Lower extremity edema has improved great deal since she received transfusion. She does not take OTC NSAIDs other than low-dose aspirin. She has history of peptic ulcer disease and ulcer at ileocecal valve found in April 2012 when she presented with profound anemia and heme positive stool. More recently she had EGD and September 2013 revealing focal gastritis with oozing' gastric AVM was ablated with APC. She has chronic low back pain.  Past Medical History  Diagnosis Date  . History of GI bleed     Associated with NSAIDS  . Iron deficiency anemia     Transfusion dependent  . DJD (degenerative joint disease)   . Essential hypertension, benign   . History of colitis   . Ileitis   . Pancytopenia   . Autoimmune hepatitis     With leukocytoclastic vasculitis  . Heart block AV second degree March 2013    a. Cardiology consult note 06/2013: "Question of previous second degree heart block, although review of cardiology notes indicates that this may have been actually blocked PACs when the patient was on verapamil."  . Bronchitis   . Steroid-induced myopathy   . Renal calculus 08/12/2011  . Rheumatoid arthritis(714.0)   . Colon ulcer 04/2010    NSAID related  . Gastric ulcer 04/2010    NSAID related  . UGI bleed 09/20/2011    Focal area of gastritis oozing of blood  . Gastric AVM 09/20/2011  . Candida esophagitis 09/20/2011   . Paroxysmal atrial fibrillation     Not on anticoag 2/2 hx of GIB/AVM  . CKD (chronic kidney disease), stage III   . Cholestatic hepatitis   . UTI (lower urinary tract infection)     history  . Paroxysmal atrial flutter     Not on anticoag 2/2 hx of GIB/AVM - dx 06/2013.  . Multifocal atrial tachycardia   . Hypertrophic cardiomyopathy     Echo 03/2011: severe LVH suggesting possble infiltrate cardiomyopathy or advanced hypertensive heart disease, increased  echogenicity of the ventricular septum as well as the pericardium, EF >70% with end systolicmid-cavitary obliteration of the ventricle, stage 1 DD, mild MR, small IVC.  Marland Kitchen Hypomagnesemia   . Chronic renal disease, stage 3, moderately decreased glomerular filtration rate between 30-59 mL/min/1.73 square meter 02/03/2014  . Anemia of chronic disease 11/25/2011  . Anemia of chronic renal failure, stage 3 (moderate) 02/03/2014  . Low back pain   . 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 Biatrial enlargement 04/04/2014  . History of pneumonia 12/2013    Past Surgical History  Procedure Laterality Date  . Colonoscopy  04/2010  . Upper gastrointestinal endoscopy  04/2010  . Esophagogastroduodenoscopy  09/20/2011    Status post APC.  Marland Kitchen Esophagogastroduodenoscopy  09/20/2011    Procedure: ESOPHAGOGASTRODUODENOSCOPY (EGD);  Surgeon: Malissa Hippo, MD;  Location: [facility];  Service: Endoscopy;  Laterality: N/A;  . Cyst removal hand      Elbow  . Sebaceous cyst removed  bilateral elbows    Family History  Problem Relation Age of Onset  . Stroke Mother   . Hypertension Sister   . Hypertension Brother   . Heart disease Mother 8  . Heart disease Father 95  . COPD Brother   . Arthritis Brother   . Osteoporosis Sister    Social History:  reports that she has been smoking Cigarettes.  She started smoking about 69 years ago. She has a 12.5 pack-year smoking history. She has never used smokeless tobacco. She reports that she does not drink  alcohol or use illicit drugs.  Allergies:  Allergies  Allergen Reactions  . Iohexol Swelling    IV Dye   . Ivp Dye [Iodinated Diagnostic Agents] Swelling    Hives  . Naprosyn [Naproxen] Hives and Itching  . Red Blood Cells Swelling and Dermatitis    With blood transfusion 2012  . Verapamil     Heart block (2nd degree) Takes Cardizem   . Vicodin [Hydrocodone-Acetaminophen] Hives and Itching  . Sulfa Antibiotics Itching and Rash    Medications Prior to Admission  Medication Sig Dispense Refill  . aspirin 81 MG chewable tablet Chew 2 tablets (162 mg total) by mouth daily.    Marland Kitchen azaTHIOprine (IMURAN) 50 MG tablet TAKE 1 AND 1/2 TABLETS BY MOUTH ONCE DAILY 60 tablet 5  . Cholecalciferol (VITAMIN D) 2000 UNITS tablet Take 2,000 Units by mouth daily.    . Coenzyme Q10 200 MG capsule Take 200 mg by mouth daily.    . Darbepoetin Alfa (ARANESP) 500 MCG/ML SOSY injection Inject 500 mcg into the skin every 21 ( twenty-one) days.    Marland Kitchen DEXILANT 60 MG capsule TAKE 1 CAPSULE BY MOUTH EVERY OTHER DAY 45 capsule 4  . diltiazem (TIAZAC) 360 MG 24 hr capsule Take 1 capsule (360 mg total) by mouth daily. 30 capsule 1  . Ergocalciferol (VITAMIN D2) 2000 UNITS TABS Take 1 tablet by mouth daily.     . fish oil-omega-3 fatty acids 1000 MG capsule Take 1 g by mouth daily.    . folic acid (FOLVITE) 800 MCG tablet Take 800 mcg by mouth daily.     . furosemide (LASIX) 20 MG tablet Take 20 mg by mouth daily as needed. For fluid retention    . lidocaine-prilocaine (EMLA) cream Apply 1 application topically as needed.     . Magnesium 400 MG TABS Take 1 tablet by mouth 2 (two) times daily.    . metoprolol (LOPRESSOR) 50 MG tablet Take 1 tablet 2 times daily on Saturday and Sunday and resume one and a half tablets 2 times daily on Monday 04/06/14. (Patient taking differently: Take 50 mg by mouth 2 (two) times daily. )    . Multiple Vitamins-Minerals (MULTIVITAMIN WITH MINERALS) tablet Take 1 tablet by mouth daily.       . polyethylene glycol-electrolytes (NULYTELY/GOLYTELY) 420 G solution Take 4,000 mLs by mouth once. 4000 mL 0  . potassium chloride SA (K-DUR,KLOR-CON) 20 MEQ tablet TAKE 1 TABLET BY MOUTH ONCE DAILY (Patient taking differently: Patient takes Monday, Wednesday, Friday) 30 tablet 5  . predniSONE (DELTASONE) 5 MG tablet Take 7.5 mg by mouth daily.    Marland Kitchen spironolactone (ALDACTONE) 50 MG tablet TAKE 1 TABLET BY MOUTH DAILY 30 tablet 6  . Travoprost, BAK Free, (TRAVATAN) 0.004 % SOLN ophthalmic solution Place 1 drop into both eyes at bedtime.    . ursodiol (ACTIGALL) 300 MG capsule Take 300 mg by mouth 2 (two) times daily.     Marland Kitchen  vitamin B-12 (CYANOCOBALAMIN) 1000 MCG tablet Take 500 mcg by mouth daily.     Marland Kitchen acetaminophen (TYLENOL) 325 MG tablet Take 650 mg by mouth every 6 (six) hours as needed. For pain    . fluticasone (FLONASE) 50 MCG/ACT nasal spray Place 2 sprays into both nostrils 2 (two) times daily.      No results found for this or any previous visit (from the past 48 hour(s)). No results found.  ROS  Blood pressure 117/64, pulse 78, temperature 97.8 F (36.6 C), temperature source Oral, height 5\' 6"  (1.676 m), weight 154 lb (69.854 kg), SpO2 98 %. Physical Exam  Constitutional:  Patient has round facies.  HENT:  Mouth/Throat: Oropharynx is clear and moist.  Eyes: Conjunctivae are normal. No scleral icterus.  Neck: No thyromegaly present.  Cardiovascular: Normal rate, regular rhythm and normal heart sounds.   No murmur heard. Respiratory: Effort normal and breath sounds normal.  GI: Soft. She exhibits no distension and no mass. There is no tenderness.  Musculoskeletal: Edema: trace edema around ankles.  Lymphadenopathy:    She has no cervical adenopathy.  Neurological: She is alert.  Skin: Skin is warm and dry.     Assessment/Plan Acute on chronic anemia. GI bleed. Diagnostic EGD and colonoscopy. Will check H&H today.  Eileen Santana 07/04/2014, 7:34 AM

## 2014-07-05 LAB — KOH PREP

## 2014-07-08 ENCOUNTER — Telehealth (INDEPENDENT_AMBULATORY_CARE_PROVIDER_SITE_OTHER): Payer: Self-pay | Admitting: *Deleted

## 2014-07-08 ENCOUNTER — Encounter (HOSPITAL_COMMUNITY): Payer: Self-pay | Admitting: Internal Medicine

## 2014-07-08 DIAGNOSIS — K754 Autoimmune hepatitis: Secondary | ICD-10-CM

## 2014-07-08 DIAGNOSIS — R770 Abnormality of albumin: Secondary | ICD-10-CM

## 2014-07-08 NOTE — Telephone Encounter (Signed)
Per Dr.Rehman the patient will need to have labs drawn in 1 month. 

## 2014-07-11 ENCOUNTER — Other Ambulatory Visit (INDEPENDENT_AMBULATORY_CARE_PROVIDER_SITE_OTHER): Payer: Self-pay | Admitting: *Deleted

## 2014-07-11 ENCOUNTER — Encounter (INDEPENDENT_AMBULATORY_CARE_PROVIDER_SITE_OTHER): Payer: Self-pay | Admitting: *Deleted

## 2014-07-11 DIAGNOSIS — K922 Gastrointestinal hemorrhage, unspecified: Secondary | ICD-10-CM

## 2014-07-14 ENCOUNTER — Encounter (HOSPITAL_COMMUNITY)
Admission: RE | Disposition: A | Discharge status: Home / Self Care | Payer: Self-pay | Source: Ambulatory Visit | Attending: Internal Medicine

## 2014-07-14 ENCOUNTER — Ambulatory Visit (HOSPITAL_COMMUNITY)
Admission: RE | Admit: 2014-07-14 | Discharge: 2014-07-14 | Disposition: A | Discharge status: Home / Self Care | Payer: Medicare Other | Source: Ambulatory Visit | Attending: Internal Medicine | Admitting: Internal Medicine

## 2014-07-14 ENCOUNTER — Encounter (HOSPITAL_COMMUNITY): Payer: Self-pay | Admitting: *Deleted

## 2014-07-14 DIAGNOSIS — N183 Chronic kidney disease, stage 3 (moderate): Secondary | ICD-10-CM | POA: Insufficient documentation

## 2014-07-14 DIAGNOSIS — M069 Rheumatoid arthritis, unspecified: Secondary | ICD-10-CM | POA: Insufficient documentation

## 2014-07-14 DIAGNOSIS — I480 Paroxysmal atrial fibrillation: Secondary | ICD-10-CM | POA: Diagnosis not present

## 2014-07-14 DIAGNOSIS — Z8673 Personal history of transient ischemic attack (TIA), and cerebral infarction without residual deficits: Secondary | ICD-10-CM | POA: Diagnosis not present

## 2014-07-14 DIAGNOSIS — I129 Hypertensive chronic kidney disease with stage 1 through stage 4 chronic kidney disease, or unspecified chronic kidney disease: Secondary | ICD-10-CM | POA: Insufficient documentation

## 2014-07-14 DIAGNOSIS — K31819 Angiodysplasia of stomach and duodenum without bleeding: Secondary | ICD-10-CM | POA: Insufficient documentation

## 2014-07-14 DIAGNOSIS — Z882 Allergy status to sulfonamides status: Secondary | ICD-10-CM | POA: Diagnosis not present

## 2014-07-14 DIAGNOSIS — K921 Melena: Secondary | ICD-10-CM | POA: Insufficient documentation

## 2014-07-14 DIAGNOSIS — K754 Autoimmune hepatitis: Secondary | ICD-10-CM | POA: Insufficient documentation

## 2014-07-14 DIAGNOSIS — K766 Portal hypertension: Secondary | ICD-10-CM | POA: Insufficient documentation

## 2014-07-14 DIAGNOSIS — K644 Residual hemorrhoidal skin tags: Secondary | ICD-10-CM | POA: Insufficient documentation

## 2014-07-14 DIAGNOSIS — D631 Anemia in chronic kidney disease: Secondary | ICD-10-CM | POA: Diagnosis not present

## 2014-07-14 DIAGNOSIS — F17210 Nicotine dependence, cigarettes, uncomplicated: Secondary | ICD-10-CM | POA: Diagnosis not present

## 2014-07-14 DIAGNOSIS — Z91041 Radiographic dye allergy status: Secondary | ICD-10-CM | POA: Insufficient documentation

## 2014-07-14 DIAGNOSIS — K5520 Angiodysplasia of colon without hemorrhage: Secondary | ICD-10-CM | POA: Diagnosis not present

## 2014-07-14 DIAGNOSIS — I4892 Unspecified atrial flutter: Secondary | ICD-10-CM | POA: Diagnosis not present

## 2014-07-14 DIAGNOSIS — I422 Other hypertrophic cardiomyopathy: Secondary | ICD-10-CM | POA: Insufficient documentation

## 2014-07-14 DIAGNOSIS — Z888 Allergy status to other drugs, medicaments and biological substances status: Secondary | ICD-10-CM | POA: Insufficient documentation

## 2014-07-14 DIAGNOSIS — D509 Iron deficiency anemia, unspecified: Secondary | ICD-10-CM | POA: Diagnosis not present

## 2014-07-14 DIAGNOSIS — K5730 Diverticulosis of large intestine without perforation or abscess without bleeding: Secondary | ICD-10-CM | POA: Diagnosis not present

## 2014-07-14 DIAGNOSIS — Z79899 Other long term (current) drug therapy: Secondary | ICD-10-CM | POA: Diagnosis not present

## 2014-07-14 DIAGNOSIS — B3781 Candidal esophagitis: Secondary | ICD-10-CM | POA: Diagnosis not present

## 2014-07-14 DIAGNOSIS — Z885 Allergy status to narcotic agent status: Secondary | ICD-10-CM | POA: Insufficient documentation

## 2014-07-14 DIAGNOSIS — K6289 Other specified diseases of anus and rectum: Secondary | ICD-10-CM | POA: Diagnosis not present

## 2014-07-14 DIAGNOSIS — K3189 Other diseases of stomach and duodenum: Secondary | ICD-10-CM | POA: Diagnosis not present

## 2014-07-14 DIAGNOSIS — K449 Diaphragmatic hernia without obstruction or gangrene: Secondary | ICD-10-CM | POA: Insufficient documentation

## 2014-07-14 HISTORY — PX: GIVENS CAPSULE STUDY: SHX5432

## 2014-07-14 SURGERY — IMAGING PROCEDURE, GI TRACT, INTRALUMINAL, VIA CAPSULE

## 2014-07-14 NOTE — H&P (Signed)
Eileen Santana is an 81 y.o. female.   Chief Complaint: Patient is here for small bowel given capsule study. HPI: Patient is 81 year old African-American female with multiple medical problems also has iron deficiency anemia and evidence of GI bleed. She underwent EGD and colonoscopy on 07/04/2014 revealing mild Candida esophagitis portal gastropathy without evidence of bleeding and pancolonic diverticulosis. Therefore bleeding source was not identified and she is returning for small bowel given capsule study. Patient received blood transfusion about 2 weeks ago.  Past Medical History  Diagnosis Date  . History of GI bleed     Associated with NSAIDS  . Iron deficiency anemia     Transfusion dependent  . DJD (degenerative joint disease)   . Essential hypertension, benign   . History of colitis   . Ileitis   . Pancytopenia   . Autoimmune hepatitis     With leukocytoclastic vasculitis  . Heart block AV second degree March 2013    a. Cardiology consult note 06/2013: "Question of previous second degree heart block, although review of cardiology notes indicates that this may have been actually blocked PACs when the patient was on verapamil."  . Bronchitis   . Steroid-induced myopathy   . Renal calculus 08/12/2011  . Rheumatoid arthritis(714.0)   . Colon ulcer 04/2010    NSAID related  . Gastric ulcer 04/2010    NSAID related  . UGI bleed 09/20/2011    Focal area of gastritis oozing of blood  . Gastric AVM 09/20/2011  . Candida esophagitis 09/20/2011  . Paroxysmal atrial fibrillation     Not on anticoag 2/2 hx of GIB/AVM  . CKD (chronic kidney disease), stage III   . Cholestatic hepatitis   . UTI (lower urinary tract infection)     history  . Paroxysmal atrial flutter     Not on anticoag 2/2 hx of GIB/AVM - dx 06/2013.  . Multifocal atrial tachycardia   . Hypertrophic cardiomyopathy     Echo 03/2011: severe LVH suggesting possble infiltrate cardiomyopathy or advanced hypertensive heart  disease, increased  echogenicity of the ventricular septum as well as the pericardium, EF >70% with end systolicmid-cavitary obliteration of the ventricle, stage 1 DD, mild MR, small IVC.  Marland Kitchen Hypomagnesemia   . Chronic renal disease, stage 3, moderately decreased glomerular filtration rate between 30-59 mL/min/1.73 square meter 02/03/2014  . Anemia of chronic disease 11/25/2011  . Anemia of chronic renal failure, stage 3 (moderate) 02/03/2014  . Low back pain   . 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 Biatrial enlargement 04/04/2014  . History of pneumonia 12/2013    Past Surgical History  Procedure Laterality Date  . Colonoscopy  04/2010  . Upper gastrointestinal endoscopy  04/2010  . Esophagogastroduodenoscopy  09/20/2011    Status post APC.  Marland Kitchen Esophagogastroduodenoscopy  09/20/2011    Procedure: ESOPHAGOGASTRODUODENOSCOPY (EGD);  Surgeon: Malissa Hippo, MD;  Location: [facility];  Service: Endoscopy;  Laterality: N/A;  . Cyst removal hand      Elbow  . Sebaceous cyst removed      bilateral elbows  . Colonoscopy N/A 07/04/2014    Procedure: COLONOSCOPY;  Surgeon: Malissa Hippo, MD;  Location: [facility];  Service: Endoscopy;  Laterality: N/A;  730  . Esophagogastroduodenoscopy N/A 07/04/2014    Procedure: ESOPHAGOGASTRODUODENOSCOPY (EGD);  Surgeon: Malissa Hippo, MD;  Location: [facility];  Service: Endoscopy;  Laterality: N/A;    Family History  Problem Relation Age of Onset  . Stroke Mother   .  Hypertension Sister   . Hypertension Brother   . Heart disease Mother 18  . Heart disease Father 95  . COPD Brother   . Arthritis Brother   . Osteoporosis Sister    Social History:  reports that she has been smoking Cigarettes.  She started smoking about 69 years ago. She has a 12.5 pack-year smoking history. She has never used smokeless tobacco. She reports that she does not drink alcohol or use illicit drugs.  Allergies:  Allergies  Allergen Reactions  . Iohexol Swelling    IV  Dye   . Ivp Dye [Iodinated Diagnostic Agents] Swelling    Hives  . Naprosyn [Naproxen] Hives and Itching  . Red Blood Cells Swelling and Dermatitis    With blood transfusion 2012  . Verapamil     Heart block (2nd degree) Takes Cardizem   . Vicodin [Hydrocodone-Acetaminophen] Hives and Itching  . Sulfa Antibiotics Itching and Rash    No prescriptions prior to admission    No results found for this or any previous visit (from the past 48 hour(s)). No results found.  ROS  There were no vitals taken for this visit. Physical Exam   Assessment/Plan GI bleed of occult origin. Iron deficiency anemia with need for PRBCs and not an infusion. Small bowel Given capsule study.  Eileen Santana U 07/14/2014, 5:36 PM

## 2014-07-15 DIAGNOSIS — Q2733 Arteriovenous malformation of digestive system vessel: Secondary | ICD-10-CM | POA: Diagnosis not present

## 2014-07-15 DIAGNOSIS — K922 Gastrointestinal hemorrhage, unspecified: Secondary | ICD-10-CM | POA: Diagnosis not present

## 2014-07-15 DIAGNOSIS — K3189 Other diseases of stomach and duodenum: Secondary | ICD-10-CM | POA: Diagnosis not present

## 2014-07-15 DIAGNOSIS — D500 Iron deficiency anemia secondary to blood loss (chronic): Secondary | ICD-10-CM

## 2014-07-15 DIAGNOSIS — Z9889 Other specified postprocedural states: Secondary | ICD-10-CM

## 2014-07-15 NOTE — Op Note (Signed)
Small Bowel Givens Capsule Study Procedure date:  07/14/2014  PCP:  Dr. [name], MD  Indication for procedure:  Patient is 81 year old African-American female with multiple medical problems who has iron deficiency anemia and evidence of GI bleed. She received 2 units of PRBCs over 2 weeks ago. She underwent EGD and colonoscopy on 07/04/2014 and no bleeding lesion was identified. She is therefore undergoing small bowel given capsule study looking for source of GI bleed.  Findings:   Patient was able to swallow Given capsule without any difficulty. Single AV malformation noted over gastric fold without stigmata of bleed. Punctate erythema noted to antral mucosa without stigmata of bleed. AV malformations noted in proximal jejunum without stigmata of bleed. These lesions can be seen on following frames; 7 minutes and 47 seconds, 10 minutes and 33 seconds, 10 minutes and 39 seconds and 12 minutes and 10 seconds.   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 Mild portal gastropathy. If she has another episode of overt GI bleed or melena will consider small bowel endoscopy as most of these vascular lesions are within reach. Findings and recommendations reviewed with patient's daughter  Ms. [employer]. Will continue to monitor patient's H&H.   CC: Dr. Arvil Chaco, MD

## 2014-07-17 ENCOUNTER — Encounter (HOSPITAL_COMMUNITY): Payer: Self-pay | Admitting: Internal Medicine

## 2014-07-17 ENCOUNTER — Telehealth (INDEPENDENT_AMBULATORY_CARE_PROVIDER_SITE_OTHER): Payer: Self-pay | Admitting: *Deleted

## 2014-07-17 DIAGNOSIS — K254 Chronic or unspecified gastric ulcer with hemorrhage: Secondary | ICD-10-CM

## 2014-07-17 NOTE — Telephone Encounter (Signed)
Eileen Santana said Dr. Karilyn Cota told her to call the office if she got weak again. Her legs are so weak and was wondering if her blood needed to be check? She is eating and drinking okay, no fever and not sick to her stomach. The return phone number is [phone].

## 2014-07-17 NOTE — Telephone Encounter (Signed)
Her stools are brown.

## 2014-07-17 NOTE — Telephone Encounter (Signed)
CBC today.  

## 2014-07-18 ENCOUNTER — Encounter (HOSPITAL_COMMUNITY): Payer: Self-pay | Admitting: *Deleted

## 2014-07-18 ENCOUNTER — Observation Stay (HOSPITAL_COMMUNITY)
Admission: EM | Admit: 2014-07-18 | Discharge: 2014-07-19 | Disposition: A | Discharge status: Home / Self Care | Payer: Medicare Other | Attending: Family Medicine | Admitting: Family Medicine

## 2014-07-18 ENCOUNTER — Encounter (HOSPITAL_COMMUNITY)
Admission: RE | Admit: 2014-07-18 | Discharge: 2014-07-18 | Disposition: A | Discharge status: Home / Self Care | Payer: Medicare Other | Source: Ambulatory Visit | Attending: Internal Medicine | Admitting: Internal Medicine

## 2014-07-18 DIAGNOSIS — K259 Gastric ulcer, unspecified as acute or chronic, without hemorrhage or perforation: Secondary | ICD-10-CM | POA: Insufficient documentation

## 2014-07-18 DIAGNOSIS — K754 Autoimmune hepatitis: Secondary | ICD-10-CM | POA: Insufficient documentation

## 2014-07-18 DIAGNOSIS — Z720 Tobacco use: Secondary | ICD-10-CM | POA: Diagnosis not present

## 2014-07-18 DIAGNOSIS — N200 Calculus of kidney: Secondary | ICD-10-CM | POA: Diagnosis not present

## 2014-07-18 DIAGNOSIS — Z79899 Other long term (current) drug therapy: Secondary | ICD-10-CM | POA: Diagnosis not present

## 2014-07-18 DIAGNOSIS — Z8619 Personal history of other infectious and parasitic diseases: Secondary | ICD-10-CM | POA: Diagnosis not present

## 2014-07-18 DIAGNOSIS — K7589 Other specified inflammatory liver diseases: Secondary | ICD-10-CM | POA: Diagnosis not present

## 2014-07-18 DIAGNOSIS — K633 Ulcer of intestine: Secondary | ICD-10-CM | POA: Insufficient documentation

## 2014-07-18 DIAGNOSIS — Z8701 Personal history of pneumonia (recurrent): Secondary | ICD-10-CM | POA: Insufficient documentation

## 2014-07-18 DIAGNOSIS — Q2733 Arteriovenous malformation of digestive system vessel: Secondary | ICD-10-CM | POA: Diagnosis not present

## 2014-07-18 DIAGNOSIS — D696 Thrombocytopenia, unspecified: Secondary | ICD-10-CM | POA: Diagnosis present

## 2014-07-18 DIAGNOSIS — Z8673 Personal history of transient ischemic attack (TIA), and cerebral infarction without residual deficits: Secondary | ICD-10-CM | POA: Diagnosis not present

## 2014-07-18 DIAGNOSIS — D61818 Other pancytopenia: Secondary | ICD-10-CM | POA: Diagnosis not present

## 2014-07-18 DIAGNOSIS — I129 Hypertensive chronic kidney disease with stage 1 through stage 4 chronic kidney disease, or unspecified chronic kidney disease: Secondary | ICD-10-CM | POA: Insufficient documentation

## 2014-07-18 DIAGNOSIS — R0602 Shortness of breath: Secondary | ICD-10-CM | POA: Diagnosis not present

## 2014-07-18 DIAGNOSIS — J40 Bronchitis, not specified as acute or chronic: Secondary | ICD-10-CM | POA: Diagnosis not present

## 2014-07-18 DIAGNOSIS — Q2730 Arteriovenous malformation, site unspecified: Secondary | ICD-10-CM | POA: Diagnosis not present

## 2014-07-18 DIAGNOSIS — M069 Rheumatoid arthritis, unspecified: Secondary | ICD-10-CM | POA: Diagnosis not present

## 2014-07-18 DIAGNOSIS — N390 Urinary tract infection, site not specified: Secondary | ICD-10-CM | POA: Insufficient documentation

## 2014-07-18 DIAGNOSIS — M1990 Unspecified osteoarthritis, unspecified site: Secondary | ICD-10-CM | POA: Insufficient documentation

## 2014-07-18 DIAGNOSIS — D500 Iron deficiency anemia secondary to blood loss (chronic): Secondary | ICD-10-CM | POA: Diagnosis present

## 2014-07-18 DIAGNOSIS — N183 Chronic kidney disease, stage 3 unspecified: Secondary | ICD-10-CM | POA: Diagnosis present

## 2014-07-18 DIAGNOSIS — I441 Atrioventricular block, second degree: Secondary | ICD-10-CM | POA: Insufficient documentation

## 2014-07-18 DIAGNOSIS — D649 Anemia, unspecified: Principal | ICD-10-CM | POA: Insufficient documentation

## 2014-07-18 DIAGNOSIS — I471 Supraventricular tachycardia: Secondary | ICD-10-CM | POA: Diagnosis not present

## 2014-07-18 DIAGNOSIS — I517 Cardiomegaly: Secondary | ICD-10-CM | POA: Insufficient documentation

## 2014-07-18 DIAGNOSIS — Z7982 Long term (current) use of aspirin: Secondary | ICD-10-CM | POA: Insufficient documentation

## 2014-07-18 DIAGNOSIS — D509 Iron deficiency anemia, unspecified: Secondary | ICD-10-CM | POA: Diagnosis present

## 2014-07-18 DIAGNOSIS — I422 Other hypertrophic cardiomyopathy: Secondary | ICD-10-CM | POA: Insufficient documentation

## 2014-07-18 DIAGNOSIS — K31819 Angiodysplasia of stomach and duodenum without bleeding: Secondary | ICD-10-CM

## 2014-07-18 DIAGNOSIS — K922 Gastrointestinal hemorrhage, unspecified: Secondary | ICD-10-CM | POA: Diagnosis not present

## 2014-07-18 DIAGNOSIS — I4719 Other supraventricular tachycardia: Secondary | ICD-10-CM | POA: Diagnosis present

## 2014-07-18 DIAGNOSIS — R5383 Other fatigue: Secondary | ICD-10-CM | POA: Diagnosis present

## 2014-07-18 DIAGNOSIS — N19 Unspecified kidney failure: Secondary | ICD-10-CM | POA: Diagnosis present

## 2014-07-18 DIAGNOSIS — I10 Essential (primary) hypertension: Secondary | ICD-10-CM | POA: Diagnosis present

## 2014-07-18 DIAGNOSIS — K529 Noninfective gastroenteritis and colitis, unspecified: Secondary | ICD-10-CM | POA: Insufficient documentation

## 2014-07-18 DIAGNOSIS — I480 Paroxysmal atrial fibrillation: Secondary | ICD-10-CM | POA: Insufficient documentation

## 2014-07-18 HISTORY — DX: Unspecified atrial fibrillation: I48.91

## 2014-07-18 LAB — CBC WITH DIFFERENTIAL/PLATELET
Basophils Absolute: 0 10*3/uL (ref 0.0–0.1)
Basophils Absolute: 0 10*3/uL (ref 0.0–0.1)
Basophils Relative: 1 % (ref 0–1)
Basophils Relative: 1 % (ref 0–1)
EOS ABS: 0.1 10*3/uL (ref 0.0–0.7)
EOS PCT: 2 % (ref 0–5)
Eosinophils Absolute: 0.1 10*3/uL (ref 0.0–0.7)
Eosinophils Relative: 2 % (ref 0–5)
HCT: 24.4 % — ABNORMAL LOW (ref 36.0–46.0)
HCT: 20.1 % — ABNORMAL LOW (ref 36.0–46.0)
HEMOGLOBIN: 7.5 g/dL — AB (ref 12.0–15.0)
Hemoglobin: 6.5 g/dL — CL (ref 12.0–15.0)
LYMPHS PCT: 19 % (ref 12–46)
Lymphocytes Relative: 11 % — ABNORMAL LOW (ref 12–46)
Lymphs Abs: 0.6 10*3/uL — ABNORMAL LOW (ref 0.7–4.0)
Lymphs Abs: 0.3 10*3/uL — ABNORMAL LOW (ref 0.7–4.0)
MCH: 24.9 pg — AB (ref 26.0–34.0)
MCH: 25.7 pg — ABNORMAL LOW (ref 26.0–34.0)
MCHC: 30.7 g/dL (ref 30.0–36.0)
MCHC: 32.3 g/dL (ref 30.0–36.0)
MCV: 81.1 fL (ref 78.0–100.0)
MCV: 79.4 fL (ref 78.0–100.0)
MONO ABS: 0.4 10*3/uL (ref 0.1–1.0)
MONOS PCT: 15 % — AB (ref 3–12)
MPV: 10.8 fL (ref 8.6–12.4)
Monocytes Absolute: 0.5 10*3/uL (ref 0.1–1.0)
Monocytes Relative: 15 % — ABNORMAL HIGH (ref 3–12)
NEUTROS PCT: 63 % (ref 43–77)
NEUTROS PCT: 70 % (ref 43–77)
Neutro Abs: 1.9 10*3/uL (ref 1.7–7.7)
Neutro Abs: 1.7 10*3/uL (ref 1.7–7.7)
PLATELETS: 158 10*3/uL (ref 150–400)
PLATELETS: 123 10*3/uL — AB (ref 150–400)
RBC: 3.01 MIL/uL — AB (ref 3.87–5.11)
RBC: 2.53 MIL/uL — ABNORMAL LOW (ref 3.87–5.11)
RDW: 20.2 % — AB (ref 11.5–15.5)
RDW: 19.4 % — AB (ref 11.5–15.5)
WBC: 3 10*3/uL — AB (ref 4.0–10.5)
WBC: 2.5 10*3/uL — AB (ref 4.0–10.5)

## 2014-07-18 LAB — URINALYSIS, ROUTINE W REFLEX MICROSCOPIC
Bilirubin Urine: NEGATIVE
Glucose, UA: NEGATIVE mg/dL
HGB URINE DIPSTICK: NEGATIVE
Ketones, ur: NEGATIVE mg/dL
Leukocytes, UA: NEGATIVE
Nitrite: NEGATIVE
PH: 6.5 (ref 5.0–8.0)
PROTEIN: NEGATIVE mg/dL
Specific Gravity, Urine: 1.025 (ref 1.005–1.030)
Urobilinogen, UA: 0.2 mg/dL (ref 0.0–1.0)

## 2014-07-18 LAB — BASIC METABOLIC PANEL
Anion gap: 5 (ref 5–15)
BUN: 27 mg/dL — AB (ref 6–20)
CO2: 28 mmol/L (ref 22–32)
CREATININE: 1.16 mg/dL — AB (ref 0.44–1.00)
Calcium: 9.8 mg/dL (ref 8.9–10.3)
Chloride: 102 mmol/L (ref 101–111)
GFR calc Af Amer: 50 mL/min — ABNORMAL LOW (ref >60)
GFR calc non Af Amer: 43 mL/min — ABNORMAL LOW (ref >60)
Glucose, Bld: 123 mg/dL — ABNORMAL HIGH (ref 65–99)
Potassium: 4.2 mmol/L (ref 3.5–5.1)
SODIUM: 135 mmol/L (ref 135–145)

## 2014-07-18 LAB — PREPARE RBC (CROSSMATCH)

## 2014-07-18 MED ORDER — NYSTATIN 100000 UNIT/ML MT SUSP
5.0000 mL | Freq: Four times a day (QID) | OROMUCOSAL | Status: DC
  Administered 2014-07-18 – 2014-07-19 (×2): 500000 [IU] via ORAL
  Filled 2014-07-18 (×2): qty 5

## 2014-07-18 MED ORDER — VITAMIN B-12 1000 MCG PO TABS
500.0000 ug | ORAL_TABLET | Freq: Every day | ORAL | Status: DC
  Administered 2014-07-19: 500 ug via ORAL
  Filled 2014-07-18: qty 1

## 2014-07-18 MED ORDER — FOLIC ACID 800 MCG PO TABS: 800.0000 ug | ORAL_TABLET | Freq: Every day | ORAL | Status: DC

## 2014-07-18 MED ORDER — PANTOPRAZOLE SODIUM 40 MG PO TBEC
40.0000 mg | DELAYED_RELEASE_TABLET | Freq: Every day | ORAL | Status: DC
  Administered 2014-07-19: 40 mg via ORAL
  Filled 2014-07-18: qty 1

## 2014-07-18 MED ORDER — FUROSEMIDE 20 MG PO TABS
20.0000 mg | ORAL_TABLET | Freq: Every day | ORAL | Status: DC
  Administered 2014-07-19: 20 mg via ORAL
  Filled 2014-07-18: qty 1

## 2014-07-18 MED ORDER — FOLIC ACID 1 MG PO TABS
1.0000 mg | ORAL_TABLET | Freq: Every day | ORAL | Status: DC
  Administered 2014-07-19: 1 mg via ORAL
  Filled 2014-07-18: qty 1

## 2014-07-18 MED ORDER — MAGNESIUM 400 MG PO TABS: 1.0000 | ORAL_TABLET | Freq: Two times a day (BID) | ORAL | Status: DC

## 2014-07-18 MED ORDER — LATANOPROST 0.005 % OP SOLN
OPHTHALMIC | Status: AC
  Filled 2014-07-18: qty 2.5

## 2014-07-18 MED ORDER — COENZYME Q10 200 MG PO CAPS: 200.0000 mg | ORAL_CAPSULE | Freq: Every day | ORAL | Status: DC

## 2014-07-18 MED ORDER — LIDOCAINE-PRILOCAINE 2.5-2.5 % EX CREA: 1.0000 "application " | TOPICAL_CREAM | CUTANEOUS | Status: DC | PRN

## 2014-07-18 MED ORDER — MAGNESIUM OXIDE 400 (241.3 MG) MG PO TABS
400.0000 mg | ORAL_TABLET | Freq: Every day | ORAL | Status: DC
  Administered 2014-07-19: 400 mg via ORAL
  Filled 2014-07-18 (×2): qty 1

## 2014-07-18 MED ORDER — SODIUM CHLORIDE 0.9 % IV SOLN
Freq: Once | INTRAVENOUS | Status: AC
  Administered 2014-07-18: 10 mL/h via INTRAVENOUS

## 2014-07-18 MED ORDER — DILTIAZEM HCL ER BEADS 240 MG PO CP24
360.0000 mg | ORAL_CAPSULE | Freq: Every day | ORAL | Status: DC
  Filled 2014-07-18 (×3): qty 1

## 2014-07-18 MED ORDER — FLUTICASONE PROPIONATE 50 MCG/ACT NA SUSP
2.0000 | Freq: Two times a day (BID) | NASAL | Status: DC
  Filled 2014-07-18: qty 16

## 2014-07-18 MED ORDER — URSODIOL 300 MG PO CAPS
300.0000 mg | ORAL_CAPSULE | Freq: Two times a day (BID) | ORAL | Status: DC
  Administered 2014-07-19: 300 mg via ORAL
  Filled 2014-07-18 (×6): qty 1

## 2014-07-18 MED ORDER — ADULT MULTIVITAMIN W/MINERALS CH
1.0000 | ORAL_TABLET | Freq: Every day | ORAL | Status: DC
  Administered 2014-07-19: 1 via ORAL
  Filled 2014-07-18: qty 1

## 2014-07-18 MED ORDER — ACETAMINOPHEN 325 MG PO TABS: 650.0000 mg | ORAL_TABLET | Freq: Four times a day (QID) | ORAL | Status: DC | PRN

## 2014-07-18 MED ORDER — SODIUM CHLORIDE 0.9 % IJ SOLN
3.0000 mL | Freq: Two times a day (BID) | INTRAMUSCULAR | Status: DC
  Administered 2014-07-18: 3 mL via INTRAVENOUS

## 2014-07-18 MED ORDER — URSODIOL 300 MG PO CAPS
ORAL_CAPSULE | ORAL | Status: AC
  Filled 2014-07-18: qty 1

## 2014-07-18 MED ORDER — VITAMIN D2 50 MCG (2000 UT) PO TABS: 1.0000 | ORAL_TABLET | Freq: Every day | ORAL | Status: DC

## 2014-07-18 MED ORDER — AZATHIOPRINE 50 MG PO TABS
75.0000 mg | ORAL_TABLET | Freq: Every day | ORAL | Status: DC
  Administered 2014-07-19: 75 mg via ORAL
  Filled 2014-07-18 (×3): qty 2

## 2014-07-18 MED ORDER — OMEGA-3 FATTY ACIDS 1000 MG PO CAPS: 1.0000 g | ORAL_CAPSULE | Freq: Every day | ORAL | Status: DC

## 2014-07-18 MED ORDER — OMEGA-3-ACID ETHYL ESTERS 1 G PO CAPS
1.0000 g | ORAL_CAPSULE | Freq: Every day | ORAL | Status: DC
  Administered 2014-07-19: 1 g via ORAL
  Filled 2014-07-18: qty 1

## 2014-07-18 MED ORDER — VITAMIN D 1000 UNITS PO TABS
2000.0000 [IU] | ORAL_TABLET | Freq: Every day | ORAL | Status: DC
  Administered 2014-07-19: 2000 [IU] via ORAL
  Filled 2014-07-18: qty 2

## 2014-07-18 MED ORDER — LATANOPROST 0.005 % OP SOLN
1.0000 [drp] | Freq: Every day | OPHTHALMIC | Status: DC
  Filled 2014-07-18: qty 2.5

## 2014-07-18 MED ORDER — METOPROLOL TARTRATE 50 MG PO TABS
50.0000 mg | ORAL_TABLET | Freq: Two times a day (BID) | ORAL | Status: DC
Start: 2014-07-18 — End: 2014-07-19
  Administered 2014-07-18 – 2014-07-19 (×2): 50 mg via ORAL
  Filled 2014-07-18 (×2): qty 1

## 2014-07-18 MED ORDER — PREDNISONE 5 MG PO TABS
7.5000 mg | ORAL_TABLET | Freq: Every day | ORAL | Status: DC
  Administered 2014-07-19: 7.5 mg via ORAL
  Filled 2014-07-18: qty 2

## 2014-07-18 NOTE — ED Notes (Signed)
CRITICAL VALUE ALERT  Critical value received:  Hemoglobin 6.5  Date of notification:  07/18/2014  Time of notification:  1408  Critical value read back:Yes.    Nurse who received alert:  L. Elmer Picker  RN  MD notified (1st page):  Dr. Adriana Simas Time of first page:  1408  MD notified (2nd page):  Time of second page:  Responding MD:  Dr. Adriana Simas  Time MD responded:  [phone]

## 2014-07-18 NOTE — ED Notes (Signed)
[facility] Hgb of 7.6 and generalized weakness. States she easily becomes SOB and swelling to lower BLE x 2 weeks.

## 2014-07-18 NOTE — ED Provider Notes (Addendum)
CSN: [id number]     Arrival date & time 07/18/14  1056 History  This chart was scribed for Eileen Otelia Sergeant, MD by Marica Otter, ED Scribe. This patient was seen in room APA05/APA05 and the patient's care was started at 12:20 PM.   Chief Complaint  Patient presents with  . Fatigue   The history is provided by the patient and a relative. No language interpreter was used.   PCP: [name], MD HPI Comments: Eileen Santana is a 81 y.o. female, directed to the ED from the cancer center, with PMH noted below including AFib, CKD stage III, and daily tobacco use (0.25ppd), who presents to the Emergency Department complaining of generalized weakness with associated SOB with mild exertion. Daughter reports pt had a colonoscopy and an EGD recently. Pt was originally scheduled for a blood transfusion at the [facility] today, however, she was directed by the Cancer Center to come to the ED for her Sx instead. Pt is rescheduled to 07/21/14 for a blood transfusion at the [facility]. No melanoma or black stool.  Past Medical History  Diagnosis Date  . History of GI bleed     Associated with NSAIDS  . Iron deficiency anemia     Transfusion dependent  . DJD (degenerative joint disease)   . Essential hypertension, benign   . History of colitis   . Ileitis   . Pancytopenia   . Autoimmune hepatitis     With leukocytoclastic vasculitis  . Heart block AV second degree March 2013    a. Cardiology consult note 06/2013: "Question of previous second degree heart block, although review of cardiology notes indicates that this may have been actually blocked PACs when the patient was on verapamil."  . Bronchitis   . Steroid-induced myopathy   . Renal calculus 08/12/2011  . Rheumatoid arthritis(714.0)   . Colon ulcer 04/2010    NSAID related  . Gastric ulcer 04/2010    NSAID related  . UGI bleed 09/20/2011    Focal area of gastritis oozing of blood  . Gastric AVM 09/20/2011  . Candida esophagitis 09/20/2011   . Paroxysmal atrial fibrillation     Not on anticoag 2/2 hx of GIB/AVM  . CKD (chronic kidney disease), stage III   . Cholestatic hepatitis   . UTI (lower urinary tract infection)     history  . Paroxysmal atrial flutter     Not on anticoag 2/2 hx of GIB/AVM - dx 06/2013.  . Multifocal atrial tachycardia   . Hypertrophic cardiomyopathy     Echo 03/2011: severe LVH suggesting possble infiltrate cardiomyopathy or advanced hypertensive heart disease, increased  echogenicity of the ventricular septum as well as the pericardium, EF >70% with end systolicmid-cavitary obliteration of the ventricle, stage 1 DD, mild MR, small IVC.  Marland Kitchen Hypomagnesemia   . Chronic renal disease, stage 3, moderately decreased glomerular filtration rate between 30-59 mL/min/1.73 square meter 02/03/2014  . Anemia of chronic disease 11/25/2011  . Anemia of chronic renal failure, stage 3 (moderate) 02/03/2014  . Low back pain   . 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 Biatrial enlargement 04/04/2014  . History of pneumonia 12/2013  . Atrial fibrillation    Past Surgical History  Procedure Laterality Date  . Colonoscopy  04/2010  . Upper gastrointestinal endoscopy  04/2010  . Esophagogastroduodenoscopy  09/20/2011    Status post APC.  Marland Kitchen Esophagogastroduodenoscopy  09/20/2011    Procedure: ESOPHAGOGASTRODUODENOSCOPY (EGD);  Surgeon: Malissa Hippo, MD;  [facility];  Service: Endoscopy;  Laterality: N/A;  . Cyst removal hand      Elbow  . Sebaceous cyst removed      bilateral elbows  . Colonoscopy N/A 07/04/2014    Procedure: COLONOSCOPY;  Surgeon: Malissa Hippo, MD;  [facility];  Service: Endoscopy;  Laterality: N/A;  730  . Esophagogastroduodenoscopy N/A 07/04/2014    Procedure: ESOPHAGOGASTRODUODENOSCOPY (EGD);  Surgeon: Malissa Hippo, MD;  [facility];  Service: Endoscopy;  Laterality: N/A;  . Givens capsule study N/A 07/14/2014    Procedure: GIVENS CAPSULE STUDY;  Surgeon: Malissa Hippo, MD;  [facility];  Service: Endoscopy;  Laterality: N/A;  730   Family History  Problem Relation Age of Onset  . Stroke Mother   . Hypertension Sister   . Hypertension Brother   . Heart disease Mother 47  . Heart disease Father 95  . COPD Brother   . Arthritis Brother   . Osteoporosis Sister    History  Substance Use Topics  . Smoking status: Current Every Day Smoker -- 0.25 packs/day for 50 years    Types: Cigarettes    Start date: 04/10/1945  . Smokeless tobacco: Never Used  . Alcohol Use: No   OB History    No data available     Review of Systems  Respiratory: Positive for shortness of breath.   Musculoskeletal: Positive for gait problem (at baseline (walks with walker) ).  Neurological: Positive for weakness.    A complete 10 system review of systems was obtained and all systems are negative except as noted in the HPI and PMH.    Allergies  Iohexol; Ivp dye; Naprosyn; Red blood cells; Verapamil; Vicodin; and Sulfa antibiotics  Home Medications   Prior to Admission medications   Medication Sig Start Date End Date Taking? Authorizing Provider  aspirin 81 MG chewable tablet Chew 2 tablets (162 mg total) by mouth daily. 04/04/14  Yes Elliot Cousin, MD  azaTHIOprine (IMURAN) 50 MG tablet TAKE 1 AND 1/2 TABLETS BY MOUTH ONCE DAILY 11/18/13  Yes Malissa Hippo, MD  Cholecalciferol (VITAMIN D) 2000 UNITS tablet Take 2,000 Units by mouth daily.   Yes Historical Provider, MD  Coenzyme Q10 200 MG capsule Take 200 mg by mouth daily.   Yes Historical Provider, MD  DEXILANT 60 MG capsule TAKE 1 CAPSULE BY MOUTH EVERY OTHER DAY 11/12/13  Yes Malissa Hippo, MD  diltiazem (TIAZAC) 360 MG 24 hr capsule Take 1 capsule (360 mg total) by mouth daily. 01/06/14  Yes Hollice Espy, MD  Ergocalciferol (VITAMIN D2) 2000 UNITS TABS Take 1 tablet by mouth daily.    Yes Historical Provider, MD  fish oil-omega-3 fatty acids 1000 MG capsule Take 1 g by mouth daily.   Yes  Historical Provider, MD  fluticasone (FLONASE) 50 MCG/ACT nasal spray Place 2 sprays into both nostrils 2 (two) times daily. 03/26/14  Yes Historical Provider, MD  folic acid (FOLVITE) 800 MCG tablet Take 800 mcg by mouth daily.    Yes Historical Provider, MD  furosemide (LASIX) 20 MG tablet Take 20 mg by mouth daily as needed. For fluid retention 04/26/11  Yes Malissa Hippo, MD  Magnesium 400 MG TABS Take 1 tablet by mouth 2 (two) times daily.   Yes Historical Provider, MD  metoprolol (LOPRESSOR) 50 MG tablet Take 1 tablet 2 times daily on Saturday and Sunday and resume one and a half tablets 2 times daily on Monday 04/06/14. Patient  taking differently: Take 50 mg by mouth 2 (two) times daily.  04/04/14  Yes Elliot Cousin, MD  Multiple Vitamins-Minerals (MULTIVITAMIN WITH MINERALS) tablet Take 1 tablet by mouth daily.     Yes Historical Provider, MD  nystatin (MYCOSTATIN) 100000 UNIT/ML suspension Take 5 mLs (500,000 Units total) by mouth 4 (four) times daily. 07/04/14  Yes Malissa Hippo, MD  potassium chloride SA (K-DUR,KLOR-CON) 20 MEQ tablet TAKE 1 TABLET BY MOUTH ONCE DAILY Patient taking differently: Patient takes Monday, Wednesday, Friday 01/14/14  Yes Malissa Hippo, MD  predniSONE (DELTASONE) 5 MG tablet Take 7.5 mg by mouth daily. 03/26/14  Yes Historical Provider, MD  spironolactone (ALDACTONE) 50 MG tablet TAKE 1 TABLET BY MOUTH DAILY 05/28/14  Yes Terri L Setzer, NP  Travoprost, BAK Free, (TRAVATAN) 0.004 % SOLN ophthalmic solution Place 1 drop into both eyes at bedtime. 09/20/11  Yes Elliot Cousin, MD  ursodiol (ACTIGALL) 300 MG capsule Take 300 mg by mouth 2 (two) times daily.  06/11/14  Yes Historical Provider, MD  vitamin B-12 (CYANOCOBALAMIN) 1000 MCG tablet Take 500 mcg by mouth daily.    Yes Historical Provider, MD  acetaminophen (TYLENOL) 325 MG tablet Take 650 mg by mouth every 6 (six) hours as needed. For pain    Historical Provider, MD  Darbepoetin Alfa (ARANESP) 500 MCG/ML SOSY  injection Inject 500 mcg into the skin every 21 ( twenty-one) days.    Historical Provider, MD  lidocaine-prilocaine (EMLA) cream Apply 1 application topically as needed.  06/20/14   Historical Provider, MD   Triage Vitals: BP 106/56 mmHg  Pulse 69  Temp(Src) 98.9 F (37.2 C) (Oral)  Resp 17  Ht 5\' 6"  (1.676 m)  Wt 154 lb (69.854 kg)  BMI 24.87 kg/m2  SpO2 99% Physical Exam  Constitutional: She is oriented to person, place, and time. She appears well-developed and well-nourished.  HENT:  Head: Normocephalic and atraumatic.  Eyes: Conjunctivae and EOM are normal. Pupils are equal, round, and reactive to light.  Neck: Normal range of motion. Neck supple.  Cardiovascular: Normal rate and regular rhythm.   Pulmonary/Chest: Effort normal and breath sounds normal.  Abdominal: Soft. Bowel sounds are normal.  Musculoskeletal: Normal range of motion.  2+perpiheral edema   Neurological: She is alert and oriented to person, place, and time.  Skin: Skin is warm and dry.  Psychiatric: She has a normal mood and affect. Her behavior is normal.  Nursing note and vitals reviewed.   ED Course  Procedures (including critical care time) DIAGNOSTIC STUDIES: Oxygen Saturation is 99% on RA, nl by my interpretation.    COORDINATION OF CARE: 12:24 PM-Discussed treatment plan with pt at bedside and pt agreed to plan.   Labs Review Labs Reviewed  CBC WITH DIFFERENTIAL/PLATELET - Abnormal; Notable for the following:    WBC 2.5 (*)    RBC 2.53 (*)    Hemoglobin 6.5 (*)    HCT 20.1 (*)    MCH 25.7 (*)    RDW 19.4 (*)    Platelets 123 (*)    Lymphocytes Relative 11 (*)    Lymphs Abs 0.3 (*)    Monocytes Relative 15 (*)    All other components within normal limits  BASIC METABOLIC PANEL - Abnormal; Notable for the following:    Glucose, Bld 123 (*)    BUN 27 (*)    Creatinine, Ser 1.16 (*)    GFR calc non Af Amer 43 (*)    GFR calc Af Amer 50 (*)  All other components within normal limits   URINALYSIS, ROUTINE W REFLEX MICROSCOPIC (NOT AT [facility])  TYPE AND SCREEN  PREPARE RBC (CROSSMATCH)    Imaging Review No results found.   EKG Interpretation None      MDM   Final diagnoses:  Anemia, unspecified anemia type    Hemoglobin is dropped to 6.5    Will admit for transfusion.  I personally performed the services described in this documentation, which was scribed in my presence. The recorded information has been reviewed and is accurate.    Donnetta Hutching, MD 07/18/14 1454  Donnetta Hutching, MD 07/18/14 1455  Donnetta Hutching, MD 07/18/14 1540

## 2014-07-18 NOTE — H&P (Signed)
History and Physical    Eileen Santana SPQ:[id number] DOB: [birth year] DOA: 07/18/2014  Referring physician: Dr. Adriana Simas PCP: [name], MD  Specialists: GI, Dr. Karilyn Cota  Chief Complaint: weakness  HPI: Eileen Santana is a 81 y.o. female has a past medical history significant for chronic GI bleed due to AVMs followed by GI, PAF, IDA, chronic pancytopenia followed by oncology, HTN, and rest MMP as below, presents to the ED with CC of weakness. She endorses generalized weakness worsening for few days, similar to her weakness in the past when she was anemic. She had a CBC done in the the oncology clinic yesterday and showed anemia of 7.5. She presented to the ED today and she was found to have a Hb of 6.5. She denies any active bleeding, denies bright red blood per rectum, denies melena. She has no abdominal pain, nausea, vomiting or diarrhea. She denies fever and chills. She denies chest pain of breathing difficulties, no lightheadedness or dizziness, denies cough or chest congestion. In the ED she was found to have a Hb of 6.5, mild pancytopenia which is at her baseline. TR[facility] asked for admission for observation and transfusion.   Review of Systems: as per HPI otherwise 10 point ROS negative.   Past Medical History  Diagnosis Date  . History of GI bleed     Associated with NSAIDS  . Iron deficiency anemia     Transfusion dependent  . DJD (degenerative joint disease)   . Essential hypertension, benign   . History of colitis   . Ileitis   . Pancytopenia   . Autoimmune hepatitis     With leukocytoclastic vasculitis  . Heart block AV second degree March 2013    a. Cardiology consult note 06/2013: "Question of previous second degree heart block, although review of cardiology notes indicates that this may have been actually blocked PACs when the patient was on verapamil."  . Bronchitis   . Steroid-induced myopathy   . Renal calculus 08/12/2011  . Rheumatoid arthritis(714.0)   . Colon ulcer  04/2010    NSAID related  . Gastric ulcer 04/2010    NSAID related  . UGI bleed 09/20/2011    Focal area of gastritis oozing of blood  . Gastric AVM 09/20/2011  . Candida esophagitis 09/20/2011  . Paroxysmal atrial fibrillation     Not on anticoag 2/2 hx of GIB/AVM  . CKD (chronic kidney disease), stage III   . Cholestatic hepatitis   . UTI (lower urinary tract infection)     history  . Paroxysmal atrial flutter     Not on anticoag 2/2 hx of GIB/AVM - dx 06/2013.  . Multifocal atrial tachycardia   . Hypertrophic cardiomyopathy     Echo 03/2011: severe LVH suggesting possble infiltrate cardiomyopathy or advanced hypertensive heart disease, increased  echogenicity of the ventricular septum as well as the pericardium, EF >70% with end systolicmid-cavitary obliteration of the ventricle, stage 1 DD, mild MR, small IVC.  Marland Kitchen Hypomagnesemia   . Chronic renal disease, stage 3, moderately decreased glomerular filtration rate between 30-59 mL/min/1.73 square meter 02/03/2014  . Anemia of chronic disease 11/25/2011  . Anemia of chronic renal failure, stage 3 (moderate) 02/03/2014  . Low back pain   . 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 Biatrial enlargement 04/04/2014  . History of pneumonia 12/2013  . Atrial fibrillation    Past Surgical History  Procedure Laterality Date  . Colonoscopy  04/2010  . Upper gastrointestinal endoscopy  04/2010  . Esophagogastroduodenoscopy  09/20/2011    Status post APC.  Marland Kitchen Esophagogastroduodenoscopy  09/20/2011    Procedure: ESOPHAGOGASTRODUODENOSCOPY (EGD);  Surgeon: Malissa Hippo, MD;  [facility];  Service: Endoscopy;  Laterality: N/A;  . Cyst removal hand      Elbow  . Sebaceous cyst removed      bilateral elbows  . Colonoscopy N/A 07/04/2014    Procedure: COLONOSCOPY;  Surgeon: Malissa Hippo, MD;  [facility];  Service: Endoscopy;  Laterality: N/A;  730  . Esophagogastroduodenoscopy N/A 07/04/2014    Procedure: ESOPHAGOGASTRODUODENOSCOPY  (EGD);  Surgeon: Malissa Hippo, MD;  [facility];  Service: Endoscopy;  Laterality: N/A;  . Givens capsule study N/A 07/14/2014    Procedure: GIVENS CAPSULE STUDY;  Surgeon: Malissa Hippo, MD;  [facility];  Service: Endoscopy;  Laterality: N/A;  730   Social History:  reports that she has been smoking Cigarettes.  She started smoking about 69 years ago. She has a 12.5 pack-year smoking history. She has never used smokeless tobacco. She reports that she does not drink alcohol or use illicit drugs.  Allergies  Allergen Reactions  . Iohexol Swelling    IV Dye   . Ivp Dye [Iodinated Diagnostic Agents] Swelling    Hives  . Naprosyn [Naproxen] Hives and Itching  . Red Blood Cells Swelling and Dermatitis    With blood transfusion 2012  . Verapamil     Heart block (2nd degree) Takes Cardizem   . Vicodin [Hydrocodone-Acetaminophen] Hives and Itching  . Sulfa Antibiotics Itching and Rash    Family History  Problem Relation Age of Onset  . Stroke Mother   . Hypertension Sister   . Hypertension Brother   . Heart disease Mother 65  . Heart disease Father 95  . COPD Brother   . Arthritis Brother   . Osteoporosis Sister     Prior to Admission medications   Medication Sig Start Date End Date Taking? Authorizing Provider  aspirin 81 MG chewable tablet Chew 2 tablets (162 mg total) by mouth daily. 04/04/14  Yes Elliot Cousin, MD  azaTHIOprine (IMURAN) 50 MG tablet TAKE 1 AND 1/2 TABLETS BY MOUTH ONCE DAILY 11/18/13  Yes Malissa Hippo, MD  Cholecalciferol (VITAMIN D) 2000 UNITS tablet Take 2,000 Units by mouth daily.   Yes Historical Provider, MD  Coenzyme Q10 200 MG capsule Take 200 mg by mouth daily.   Yes Historical Provider, MD  DEXILANT 60 MG capsule TAKE 1 CAPSULE BY MOUTH EVERY OTHER DAY 11/12/13  Yes Malissa Hippo, MD  diltiazem (TIAZAC) 360 MG 24 hr capsule Take 1 capsule (360 mg total) by mouth daily. 01/06/14  Yes Hollice Espy, MD  Ergocalciferol  (VITAMIN D2) 2000 UNITS TABS Take 1 tablet by mouth daily.    Yes Historical Provider, MD  fish oil-omega-3 fatty acids 1000 MG capsule Take 1 g by mouth daily.   Yes Historical Provider, MD  fluticasone (FLONASE) 50 MCG/ACT nasal spray Place 2 sprays into both nostrils 2 (two) times daily. 03/26/14  Yes Historical Provider, MD  folic acid (FOLVITE) 800 MCG tablet Take 800 mcg by mouth daily.    Yes Historical Provider, MD  furosemide (LASIX) 20 MG tablet Take 20 mg by mouth daily as needed. For fluid retention 04/26/11  Yes Malissa Hippo, MD  Magnesium 400 MG TABS Take 1 tablet by mouth 2 (two) times daily.   Yes Historical Provider, MD  metoprolol (LOPRESSOR) 50 MG tablet  Take 1 tablet 2 times daily on Saturday and Sunday and resume one and a half tablets 2 times daily on Monday 04/06/14. Patient taking differently: Take 50 mg by mouth 2 (two) times daily.  04/04/14  Yes Elliot Cousin, MD  Multiple Vitamins-Minerals (MULTIVITAMIN WITH MINERALS) tablet Take 1 tablet by mouth daily.     Yes Historical Provider, MD  nystatin (MYCOSTATIN) 100000 UNIT/ML suspension Take 5 mLs (500,000 Units total) by mouth 4 (four) times daily. 07/04/14  Yes Malissa Hippo, MD  potassium chloride SA (K-DUR,KLOR-CON) 20 MEQ tablet TAKE 1 TABLET BY MOUTH ONCE DAILY Patient taking differently: Patient takes Monday, Wednesday, Friday 01/14/14  Yes Malissa Hippo, MD  predniSONE (DELTASONE) 5 MG tablet Take 7.5 mg by mouth daily. 03/26/14  Yes Historical Provider, MD  spironolactone (ALDACTONE) 50 MG tablet TAKE 1 TABLET BY MOUTH DAILY 05/28/14  Yes Terri L Setzer, NP  Travoprost, BAK Free, (TRAVATAN) 0.004 % SOLN ophthalmic solution Place 1 drop into both eyes at bedtime. 09/20/11  Yes Elliot Cousin, MD  ursodiol (ACTIGALL) 300 MG capsule Take 300 mg by mouth 2 (two) times daily.  06/11/14  Yes Historical Provider, MD  vitamin B-12 (CYANOCOBALAMIN) 1000 MCG tablet Take 500 mcg by mouth daily.    Yes Historical Provider, MD    acetaminophen (TYLENOL) 325 MG tablet Take 650 mg by mouth every 6 (six) hours as needed. For pain    Historical Provider, MD  Darbepoetin Alfa (ARANESP) 500 MCG/ML SOSY injection Inject 500 mcg into the skin every 21 ( twenty-one) days.    Historical Provider, MD  lidocaine-prilocaine (EMLA) cream Apply 1 application topically as needed.  06/20/14   Historical Provider, MD   Physical Exam: Filed Vitals:   07/18/14 1400 07/18/14 1430 07/18/14 1445 07/18/14 1500  BP: 109/58 118/61  105/51  Pulse: 69   68  Temp:      TempSrc:      Resp: 19 22 18 20   Height:      Weight:      SpO2: 100%   100%    General:  No apparent distress, pleasant AA female lying in bed  Eyes: PERRL, EOMI, no scleral icterus  ENT: moist oropharynx  Neck: supple  Cardiovascular: regular rate without MRG; 2+ peripheral pulses, no JVD, trace peripheral edema  Respiratory: CTA biL, good air movement without wheezing, rhonchi or crackled  Abdomen: soft, non tender to palpation, positive bowel sounds, no guarding, no rebound  Skin: no rashes  Musculoskeletal: normal bulk and tone, no joint swelling  Psychiatric: normal mood and affect  Neurologic: strength 5/5 in all 4  Labs on Admission:  Basic Metabolic Panel:  Recent Labs Lab 07/18/14 1323  NA 135  K 4.2  CL 102  CO2 28  GLUCOSE 123*  BUN 27*  CREATININE 1.16*  CALCIUM 9.8   CBC:  Recent Labs Lab 07/17/14 1511 07/18/14 1323  WBC 3.0* 2.5*  NEUTROABS 1.9 1.7  HGB 7.5* 6.5*  HCT 24.4* 20.1*  MCV 81.1 79.4  PLT 158 123*   EKG: not done  Assessment/Plan Active Problems:   GI bleed   Microcytic anemia   Renal failure   HTN (hypertension), benign   Thrombocytopenia   Rheumatoid arthritis   Gastric AVM   PAT (paroxysmal atrial tachycardia)   Iron deficiency anemia due to chronic blood loss   AVM (congenital arteriovenous malformation)   Chronic renal disease, stage 3, moderately decreased glomerular filtration rate between  30-59 mL/min/1.73 square meter  Blood loss anemia   Acute in chronic blood loss anemia - in the setting of known pancytopenia, iron deficiency and chronic GI blood loss - will admit to telemetry, transfuse 2U pRBC, repeat CBC in am, will follow transfusion with Lasix as she tends to get fluid bildup in her legs - no active and large GI bleed to require immediate intervention. I have discussed with per primary gastroenterologist Dr. Karilyn Cota and he will be available if needed over the weekend (pager: [phone]) - just had EGD and colonoscopy without large source of bleeding  Pancytopenia - WBC, platelets decreased, at baseline  CKD III - Cr at baseline  Iron deficiency anemia  - chronic  RA - continue home medications with prednisone and imuran  HTN - continue home medications  Paroxysmal atrial tachycardia - monitor on telemetry, currently in sinus rhythm - continue Metoprolol and Diltiazem  Autoimmune hepatitis - chronic, outpatient management   Diet: regular Fluids: none  DVT Prophylaxis: SCD  Code Status: Full  Family Communication: d/w daughter bedside  Disposition Plan: admit to tele   Costin M. Elvera Lennox, MD [facility] Pager [phone]  If 7PM-7AM, please contact night-coverage [email or web address] Password [facility] 07/18/2014, 3:44 PM

## 2014-07-19 DIAGNOSIS — N183 Chronic kidney disease, stage 3 (moderate): Secondary | ICD-10-CM | POA: Diagnosis not present

## 2014-07-19 DIAGNOSIS — D500 Iron deficiency anemia secondary to blood loss (chronic): Secondary | ICD-10-CM

## 2014-07-19 DIAGNOSIS — D649 Anemia, unspecified: Secondary | ICD-10-CM | POA: Diagnosis not present

## 2014-07-19 DIAGNOSIS — Q2733 Arteriovenous malformation of digestive system vessel: Secondary | ICD-10-CM | POA: Diagnosis not present

## 2014-07-19 DIAGNOSIS — I10 Essential (primary) hypertension: Secondary | ICD-10-CM

## 2014-07-19 DIAGNOSIS — Q2730 Arteriovenous malformation, site unspecified: Secondary | ICD-10-CM | POA: Diagnosis not present

## 2014-07-19 LAB — TYPE AND SCREEN
ABO/RH(D): A POS
ANTIBODY SCREEN: NEGATIVE
Unit division: 0
Unit division: 0

## 2014-07-19 LAB — COMPREHENSIVE METABOLIC PANEL
ALK PHOS: 51 U/L (ref 38–126)
ALT: 13 U/L — ABNORMAL LOW (ref 14–54)
AST: 115 U/L — ABNORMAL HIGH (ref 15–41)
Albumin: 2.1 g/dL — ABNORMAL LOW (ref 3.5–5.0)
Anion gap: 4 — ABNORMAL LOW (ref 5–15)
BUN: 29 mg/dL — ABNORMAL HIGH (ref 6–20)
CO2: 26 mmol/L (ref 22–32)
Calcium: 8.8 mg/dL — ABNORMAL LOW (ref 8.9–10.3)
Chloride: 107 mmol/L (ref 101–111)
Creatinine, Ser: 1.16 mg/dL — ABNORMAL HIGH (ref 0.44–1.00)
GFR, EST AFRICAN AMERICAN: 50 mL/min — AB (ref >60)
GFR, EST NON AFRICAN AMERICAN: 43 mL/min — AB (ref >60)
GLUCOSE: 106 mg/dL — AB (ref 65–99)
POTASSIUM: 3.8 mmol/L (ref 3.5–5.1)
SODIUM: 137 mmol/L (ref 135–145)
Total Bilirubin: 2.6 mg/dL — ABNORMAL HIGH (ref 0.3–1.2)
Total Protein: 5.1 g/dL — ABNORMAL LOW (ref 6.5–8.1)

## 2014-07-19 LAB — CBC
HEMATOCRIT: 26.3 % — AB (ref 36.0–46.0)
HEMOGLOBIN: 8.8 g/dL — AB (ref 12.0–15.0)
MCH: 26.7 pg (ref 26.0–34.0)
MCHC: 33.5 g/dL (ref 30.0–36.0)
MCV: 79.7 fL (ref 78.0–100.0)
Platelets: 95 10*3/uL — ABNORMAL LOW (ref 150–400)
RBC: 3.3 MIL/uL — ABNORMAL LOW (ref 3.87–5.11)
RDW: 17.6 % — ABNORMAL HIGH (ref 11.5–15.5)
WBC: 3.1 10*3/uL — AB (ref 4.0–10.5)

## 2014-07-19 MED ORDER — DILTIAZEM HCL ER COATED BEADS 180 MG PO CP24
360.0000 mg | ORAL_CAPSULE | Freq: Every day | ORAL | Status: DC
  Administered 2014-07-19: 360 mg via ORAL
  Filled 2014-07-19: qty 2

## 2014-07-19 NOTE — Discharge Summary (Signed)
Physician Discharge Summary  Eileen Santana VPX:[id number] DOB: 06-24-1930 DOA: 07/18/2014  PCP: [name], MD  Admit date: 07/18/2014 Discharge date: 07/19/2014  Time spent: 50* minutes  Recommendations for Outpatient Follow-up:  1. Follow up PCP in 2 weeks 2. Follow up GI in one week  Discharge Diagnoses:  Active Problems:   GI bleed   Microcytic anemia   Renal failure   HTN (hypertension), benign   Thrombocytopenia   Rheumatoid arthritis   Gastric AVM   PAT (paroxysmal atrial tachycardia)   Iron deficiency anemia due to chronic blood loss   AVM (congenital arteriovenous malformation)   Chronic renal disease, stage 3, moderately decreased glomerular filtration rate between 30-59 mL/min/1.73 square meter   Blood loss anemia   Discharge Condition: Stable  Diet recommendation: regular diet  Filed Weights   07/18/14 1101  Weight: 69.854 kg (154 lb)    History of present illness:  81 y.o. female has a past medical history significant for chronic GI bleed due to AVMs followed by GI, PAF, IDA, chronic pancytopenia followed by oncology, HTN, and rest MMP as below, presents to the ED with CC of weakness. She endorses generalized weakness worsening for few days, similar to her weakness in the past when she was anemic. She had a CBC done in the the oncology clinic yesterday and showed anemia of 7.5. She presented to the ED today and she was found to have a Hb of 6.5. She denies any active bleeding, denies bright red blood per rectum, denies melena. She has no abdominal pain, nausea, vomiting or diarrhea. She denies fever and chills. She denies chest pain of breathing difficulties, no lightheadedness or dizziness, denies cough or chest congestion. In the ED she was found to have a Hb of 6.5, mild pancytopenia which is at her baseline  Hospital Course:   Acute in chronic blood loss anemia - in the setting of known pancytopenia, iron deficiency and chronic GI blood loss - s/p   Transfusion  2U PRBC, today the hemoglobin is 8.8 - no active and large GI bleed to require immediate intervention. Dr [name]  discussed with per primary gastroenterologist Dr. Karilyn Cota  - just had EGD and colonoscopy without large source of bleeding - patient says that she also had capsule endoscopy without any significant abnormality  Pancytopenia - WBC, platelets decreased, at baseline  CKD III - Cr at baseline  Iron deficiency anemia  - chronic  RA - continue home medications with prednisone and imuran  HTN - continue home medications  Paroxysmal atrial tachycardia - continue Metoprolol and Diltiazem  Autoimmune hepatitis - chronic, outpatient management  Procedures:  None  Consultations:  None  Discharge Exam: Filed Vitals:   07/19/14 0610  BP: 117/59  Pulse: 81  Temp: 98.9 F (37.2 C)  Resp: 18    General: Appear in no acute distress Cardiovascular: S1s2 RRR Respiratory: *Clear bilaterally  Discharge Instructions   Discharge Instructions    Diet - low sodium heart healthy    Complete by:  As directed      Increase activity slowly    Complete by:  As directed           Current Discharge Medication List    CONTINUE these medications which have NOT CHANGED   Details  aspirin 81 MG chewable tablet Chew 2 tablets (162 mg total) by mouth daily.    azaTHIOprine (IMURAN) 50 MG tablet TAKE 1 AND 1/2 TABLETS BY MOUTH ONCE DAILY Qty: 60 tablet, Refills: 5  Cholecalciferol (VITAMIN D) 2000 UNITS tablet Take 2,000 Units by mouth daily.    Coenzyme Q10 200 MG capsule Take 200 mg by mouth daily.    DEXILANT 60 MG capsule TAKE 1 CAPSULE BY MOUTH EVERY OTHER DAY Qty: 45 capsule, Refills: 4    diltiazem (TIAZAC) 360 MG 24 hr capsule Take 1 capsule (360 mg total) by mouth daily. Qty: 30 capsule, Refills: 1    Ergocalciferol (VITAMIN D2) 2000 UNITS TABS Take 1 tablet by mouth daily.     fish oil-omega-3 fatty acids 1000 MG capsule Take 1 g by  mouth daily.   Associated Diagnoses: Pancytopenia; Elevated liver enzymes    fluticasone (FLONASE) 50 MCG/ACT nasal spray Place 2 sprays into both nostrils 2 (two) times daily.    folic acid (FOLVITE) 800 MCG tablet Take 800 mcg by mouth daily.    Associated Diagnoses: Pancytopenia    furosemide (LASIX) 20 MG tablet Take 20 mg by mouth daily as needed. For fluid retention   Associated Diagnoses: Anemia; Elevated liver enzymes    Magnesium 400 MG TABS Take 1 tablet by mouth 2 (two) times daily.    metoprolol (LOPRESSOR) 50 MG tablet Take 1 tablet 2 times daily on Saturday and Sunday and resume one and a half tablets 2 times daily on Monday 04/06/14.    Multiple Vitamins-Minerals (MULTIVITAMIN WITH MINERALS) tablet Take 1 tablet by mouth daily.     Associated Diagnoses: Pancytopenia    nystatin (MYCOSTATIN) 100000 UNIT/ML suspension Take 5 mLs (500,000 Units total) by mouth 4 (four) times daily. Qty: 240 mL, Refills: 2    potassium chloride SA (K-DUR,KLOR-CON) 20 MEQ tablet TAKE 1 TABLET BY MOUTH ONCE DAILY Qty: 30 tablet, Refills: 5    predniSONE (DELTASONE) 5 MG tablet Take 7.5 mg by mouth daily.    spironolactone (ALDACTONE) 50 MG tablet TAKE 1 TABLET BY MOUTH DAILY Qty: 30 tablet, Refills: 6    Travoprost, BAK Free, (TRAVATAN) 0.004 % SOLN ophthalmic solution Place 1 drop into both eyes at bedtime.    ursodiol (ACTIGALL) 300 MG capsule Take 300 mg by mouth 2 (two) times daily.     vitamin B-12 (CYANOCOBALAMIN) 1000 MCG tablet Take 500 mcg by mouth daily.     acetaminophen (TYLENOL) 325 MG tablet Take 650 mg by mouth every 6 (six) hours as needed. For pain    Darbepoetin Alfa (ARANESP) 500 MCG/ML SOSY injection Inject 500 mcg into the skin every 21 ( twenty-one) days.    lidocaine-prilocaine (EMLA) cream Apply 1 application topically as needed.        Allergies  Allergen Reactions  . Iohexol Swelling    IV Dye   . Ivp Dye [Iodinated Diagnostic Agents] Swelling    Hives   . Naprosyn [Naproxen] Hives and Itching  . Red Blood Cells Swelling and Dermatitis    With blood transfusion 2012  . Verapamil     Heart block (2nd degree) Takes Cardizem   . Vicodin [Hydrocodone-Acetaminophen] Hives and Itching  . Sulfa Antibiotics Itching and Rash      The results of significant diagnostics from this hospitalization (including imaging, microbiology, ancillary and laboratory) are listed below for reference.    Significant Diagnostic Studies: No results found.  Microbiology: No results found for this or any previous visit (from the past 240 hour(s)).   Labs: Basic Metabolic Panel:  Recent Labs Lab 07/18/14 1323 07/19/14 0502  NA 135 137  K 4.2 3.8  CL 102 107  CO2 28 26  GLUCOSE  123* 106*  BUN 27* 29*  CREATININE 1.16* 1.16*  CALCIUM 9.8 8.8*   Liver Function Tests:  Recent Labs Lab 07/19/14 0502  AST 115*  ALT 13*  ALKPHOS 51  BILITOT 2.6*  PROT 5.1*  ALBUMIN 2.1*   No results for input(s): LIPASE, AMYLASE in the last 168 hours. No results for input(s): AMMONIA in the last 168 hours. CBC:  Recent Labs Lab 07/17/14 1511 07/18/14 1323 07/19/14 0502  WBC 3.0* 2.5* 3.1*  NEUTROABS 1.9 1.7  --   HGB 7.5* 6.5* 8.8*  HCT 24.4* 20.1* 26.3*  MCV 81.1 79.4 79.7  PLT 158 123* 95*   Cardiac Enzymes: No results for input(s): CKTOTAL, CKMB, CKMBINDEX, TROPONINI in the last 168 hours. BNP: BNP (last 3 results)  Recent Labs  01/03/14 1016  BNP 383.0*    ProBNP (last 3 results) No results for input(s): PROBNP in the last 8760 hours.  CBG: No results for input(s): GLUCAP in the last 168 hours.     SignedMauro Kaufmann S  [facility] 07/19/2014, 10:45 AM

## 2014-07-19 NOTE — Progress Notes (Signed)
Discharge instructions given, verbalized understanding, out in stable condition via w/c with staff. 

## 2014-07-21 ENCOUNTER — Encounter (HOSPITAL_COMMUNITY): Admission: RE | Admit: 2014-07-21 | Payer: Medicare Other | Source: Ambulatory Visit

## 2014-07-28 ENCOUNTER — Encounter (HOSPITAL_COMMUNITY): Payer: Self-pay | Admitting: Hematology & Oncology

## 2014-07-28 ENCOUNTER — Other Ambulatory Visit (INDEPENDENT_AMBULATORY_CARE_PROVIDER_SITE_OTHER): Payer: Self-pay | Admitting: Internal Medicine

## 2014-07-28 ENCOUNTER — Encounter (HOSPITAL_COMMUNITY): Payer: Medicare Other | Attending: Oncology | Admitting: Hematology & Oncology

## 2014-07-28 ENCOUNTER — Encounter (HOSPITAL_BASED_OUTPATIENT_CLINIC_OR_DEPARTMENT_OTHER): Payer: Medicare Other

## 2014-07-28 ENCOUNTER — Encounter (HOSPITAL_COMMUNITY): Payer: Medicare Other

## 2014-07-28 VITALS — BP 110/93 | HR 65 | Temp 98.4°F | Resp 16 | Wt 152.8 lb

## 2014-07-28 DIAGNOSIS — D638 Anemia in other chronic diseases classified elsewhere: Secondary | ICD-10-CM

## 2014-07-28 DIAGNOSIS — D500 Iron deficiency anemia secondary to blood loss (chronic): Secondary | ICD-10-CM | POA: Insufficient documentation

## 2014-07-28 DIAGNOSIS — D649 Anemia, unspecified: Secondary | ICD-10-CM | POA: Insufficient documentation

## 2014-07-28 DIAGNOSIS — K754 Autoimmune hepatitis: Secondary | ICD-10-CM

## 2014-07-28 DIAGNOSIS — D631 Anemia in chronic kidney disease: Secondary | ICD-10-CM | POA: Diagnosis not present

## 2014-07-28 DIAGNOSIS — D61818 Other pancytopenia: Secondary | ICD-10-CM

## 2014-07-28 DIAGNOSIS — D509 Iron deficiency anemia, unspecified: Secondary | ICD-10-CM | POA: Insufficient documentation

## 2014-07-28 DIAGNOSIS — N183 Chronic kidney disease, stage 3 (moderate): Secondary | ICD-10-CM | POA: Diagnosis present

## 2014-07-28 DIAGNOSIS — D696 Thrombocytopenia, unspecified: Secondary | ICD-10-CM

## 2014-07-28 DIAGNOSIS — N289 Disorder of kidney and ureter, unspecified: Secondary | ICD-10-CM | POA: Insufficient documentation

## 2014-07-28 DIAGNOSIS — M069 Rheumatoid arthritis, unspecified: Secondary | ICD-10-CM

## 2014-07-28 LAB — CBC WITH DIFFERENTIAL/PLATELET
Basophils Absolute: 0 10*3/uL (ref 0.0–0.1)
Basophils Relative: 1 % (ref 0–1)
EOS PCT: 3 % (ref 0–5)
Eosinophils Absolute: 0.1 10*3/uL (ref 0.0–0.7)
HEMATOCRIT: 25.3 % — AB (ref 36.0–46.0)
HEMOGLOBIN: 8.1 g/dL — AB (ref 12.0–15.0)
Lymphocytes Relative: 12 % (ref 12–46)
Lymphs Abs: 0.4 10*3/uL — ABNORMAL LOW (ref 0.7–4.0)
MCH: 26 pg (ref 26.0–34.0)
MCHC: 32 g/dL (ref 30.0–36.0)
MCV: 81.1 fL (ref 78.0–100.0)
MONO ABS: 0.5 10*3/uL (ref 0.1–1.0)
MONOS PCT: 18 % — AB (ref 3–12)
NEUTROS PCT: 66 % (ref 43–77)
Neutro Abs: 1.9 10*3/uL (ref 1.7–7.7)
PLATELETS: 89 10*3/uL — AB (ref 150–400)
RBC: 3.12 MIL/uL — AB (ref 3.87–5.11)
RDW: 19 % — AB (ref 11.5–15.5)
WBC: 2.9 10*3/uL — AB (ref 4.0–10.5)

## 2014-07-28 LAB — PREPARE RBC (CROSSMATCH)

## 2014-07-28 MED ORDER — DARBEPOETIN ALFA 100 MCG/0.5ML IJ SOSY
PREFILLED_SYRINGE | INTRAMUSCULAR | Status: AC
  Filled 2014-07-28: qty 0.5

## 2014-07-28 MED ORDER — DARBEPOETIN ALFA 100 MCG/0.5ML IJ SOSY
80.0000 ug | PREFILLED_SYRINGE | Freq: Once | INTRAMUSCULAR | Status: AC
  Administered 2014-07-28: 80 ug via SUBCUTANEOUS

## 2014-07-28 NOTE — Progress Notes (Signed)
Eileen Santana presented for labwork. Labs per MD order drawn via Peripheral Line 23 gauge needle inserted in right AC.  Good blood return present. Procedure without incident.  Needle removed intact. Patient tolerated procedure well.   

## 2014-07-28 NOTE — Progress Notes (Signed)
[name], MD [address] [place] [address]  Chronic anemia Hemoccult positive Stool History of PUD, ulcer at the ileocecal valve History of Gastric AVM CVA's Autoimmune Hepatitis Thrombocytopenia Rheumatoid arthritis Stage III CKD Pillcam 07/15/2014 with single gastric and multiple proximal jejunal AVM's, mild portal gastropathy   CURRENT THERAPY: Intermittent IV Ferahame PRN. ESA supportive therapy plan deleted as it was not renal dosing and we will re-institute this treatment in the future if needed at the appropriate dosing and schedule.   INTERVAL HISTORY: Eileen Santana 81 y.o. female returns for followup of anemia of chronic disease with rheumatoid arthritis, anemia of chronic renal disease, and iron deficiency with blood loss secondary to intestinal AV malformations, chronic kidney disease, hypersplenism and history of depletion of iron stores by the use of ESA in past.   Chart reviewed. Brian was in the hospital from 3/30- 04/04/2014 for expressive aphasia secondary to acute punctate stroke, embolic.  She was started on antiplatelet therapy with 162 mg of ASA.  She notes her legs are still weak, but are improving.  "They have to get better by May because I have the senior games coming up."  She is not bowling this year, instead she is going to participate in shuffle board.  She denies any GI and GU blood loss.  Hematologically, she denies any complaints and ROS questioning is negative.   The patient is alone today and uses a wheelchair.  She is sitting in a regular chair in the examination room. She loves to bowl and be active however her back no longer allows her to.  The patient states that "she does not feel like she did before".  She is eating and sleeping regularly.   She has not had a mammogram this year. The patient says that she knows when her blood counts are low based on how she feels.  She gets tired, feels bad, and her legs begin to hurt. She  thinks she may need a transfusion this week.  She has seen Dr. Laural Golden and undergone additional GI evaluation.  Past Medical History  Diagnosis Date  . History of GI bleed     Associated with NSAIDS  . Iron deficiency anemia     Transfusion dependent  . DJD (degenerative joint disease)   . Essential hypertension, benign   . History of colitis   . Ileitis   . Pancytopenia   . Autoimmune hepatitis     With leukocytoclastic vasculitis  . Heart block AV second degree March 2013    a. Cardiology consult note 06/2013: "Question of previous second degree heart block, although review of cardiology notes indicates that this may have been actually blocked PACs when the patient was on verapamil."  . Bronchitis   . Steroid-induced myopathy   . Renal calculus 08/12/2011  . Rheumatoid arthritis(714.0)   . Colon ulcer 04/2010    NSAID related  . Gastric ulcer 04/2010    NSAID related  . UGI bleed 09/20/2011    Focal area of gastritis oozing of blood  . Gastric AVM 09/20/2011  . Candida esophagitis 09/20/2011  . Paroxysmal atrial fibrillation     Not on anticoag 2/2 hx of GIB/AVM  . CKD (chronic kidney disease), stage III   . Cholestatic hepatitis   . UTI (lower urinary tract infection)     history  . Paroxysmal atrial flutter     Not on anticoag 2/2 hx of GIB/AVM - dx 06/2013.  Marland Kitchen  Multifocal atrial tachycardia   . Hypertrophic cardiomyopathy     Echo 03/2011: severe LVH suggesting possble infiltrate cardiomyopathy or advanced hypertensive heart disease, increased  echogenicity of the ventricular septum as well as the pericardium, EF >53% with end systolicmid-cavitary obliteration of the ventricle, stage 1 DD, mild MR, small IVC.  Marland Kitchen Hypomagnesemia   . Chronic renal disease, stage 3, moderately decreased glomerular filtration rate between 30-59 mL/min/1.73 square meter 02/03/2014  . Anemia of chronic disease 11/25/2011  . Anemia of chronic renal failure, stage 3 (moderate) 02/03/2014  . Low back pain     . Stroke, acute, embolic 06/08/4401  . Biatrial enlargement 04/04/2014  . History of pneumonia 12/2013  . Atrial fibrillation     has Other pancytopenia; Sedimentation rate elevation; Elevated liver enzymes; GI bleed; Microcytic anemia; Orthostatic hypotension; Bradycardia; Renal failure; Autoimmune hepatitis; HTN (hypertension), benign; Abdominal pain, acute, generalized; Nausea; Diarrhea; Hypomagnesemia; Hypokalemia; Prolonged Q-T interval on ECG; UTI (urinary tract infection); Gallstones; Thrombocytopenia; Renal calculus; Chronic back pain; Leg pain; Rheumatoid arthritis; Acute renal insufficiency; Hypoalbuminemia; Guaiac positive stools; UGI bleed; Gastric AVM; Candida esophagitis; Anemia of chronic disease; PAT (paroxysmal atrial tachycardia); Hypertrophic cardiomyopathy; Hypersplenism; Iron deficiency anemia due to chronic blood loss; AVM (congenital arteriovenous malformation); Atrial tachycardia; Atrial flutter; Atrial fibrillation with RVR; Healthcare-associated pneumonia; COPD (chronic obstructive pulmonary disease); HCAP (healthcare-associated pneumonia); Atrial fibrillation with rapid ventricular response; Atrial flutter with rapid ventricular response; Chronic renal disease, stage 3, moderately decreased glomerular filtration rate between 30-59 mL/min/1.73 square meter; Anemia of chronic renal failure, stage 3 (moderate); TIA (transient ischemic attack); Adrenal insufficiency; Aphasia; Stroke, acute, embolic; Biatrial enlargement; and Blood loss anemia on her problem list.     is allergic to iohexol; ivp dye; naprosyn; red blood cells; verapamil; vicodin; and sulfa antibiotics.  We administered Darbepoetin Alfa.  Past Surgical History  Procedure Laterality Date  . Colonoscopy  04/2010  . Upper gastrointestinal endoscopy  04/2010  . Esophagogastroduodenoscopy  09/20/2011    Status post APC.  Marland Kitchen Esophagogastroduodenoscopy  09/20/2011    Procedure: ESOPHAGOGASTRODUODENOSCOPY (EGD);  Surgeon:  Rogene Houston, MD;  [facility];  Service: Endoscopy;  Laterality: N/A;  . Cyst removal hand      Elbow  . Sebaceous cyst removed      bilateral elbows  . Colonoscopy N/A 07/04/2014    Procedure: COLONOSCOPY;  Surgeon: Rogene Houston, MD;  Location: [facility];  Service: Endoscopy;  Laterality: N/A;  730  . Esophagogastroduodenoscopy N/A 07/04/2014    Procedure: ESOPHAGOGASTRODUODENOSCOPY (EGD);  Surgeon: Rogene Houston, MD;  [facility]: [facility];  Service: Endoscopy;  Laterality: N/A;  . Givens capsule study N/A 07/14/2014    Procedure: GIVENS CAPSULE STUDY;  Surgeon: Rogene Houston, MD;  Location: [facility];  Service: Endoscopy;  Laterality: N/A;  730    Denies any headaches, dizziness, double vision, fevers, chills, night sweats, nausea, vomiting, diarrhea, constipation, chest pain, heart palpitations, shortness of breath, blood in stool, black tarry stool, urinary pain, urinary burning, urinary frequency, hematuria.   PHYSICAL EXAMINATION  ECOG PERFORMANCE STATUS: 2 - Symptomatic, <50% confined to bed  Filed Vitals:   07/28/14 1138  BP: 110/93  Pulse: 65  Temp: 98.4 F (36.9 C)  Resp: 16    GENERAL:alert, no distress, well nourished, well developed, comfortable, cooperative, smiling and unaccompanied. SKIN: skin color, texture, turgor are normal, no rashes or significant lesions HEAD: Normocephalic, No masses, lesions, tenderness or abnormalities EYES: normal, PERRLA, EOMI, Conjunctiva are pink and  non-injected EARS: External ears normal OROPHARYNX:lips, buccal mucosa, and tongue normal and mucous membranes are moist  NECK: supple, no adenopathy, thyroid normal size, non-tender, without nodularity, trachea midline LYMPH:  no palpable lymphadenopathy BREAST:not examined LUNGS: clear to auscultation  HEART: regular rate & rhythm, no murmurs and no gallops ABDOMEN:abdomen soft and normal bowel sounds BACK: Back symmetric, no curvature. EXTREMITIES:less  then 2 second capillary refill, no joint deformities, effusion, or inflammation, no skin discoloration, no cyanosis  NEURO: alert & oriented x 3 with fluent speech, no focal motor/sensory deficits, in wheelchair    LABORATORY DATA: CBC    Component Value Date/Time   WBC 2.9* 07/28/2014 1115   RBC 3.12* 07/28/2014 1115   RBC 3.62* 03/16/2012 0937   HGB 8.1* 07/28/2014 1115   HCT 25.3* 07/28/2014 1115   PLT 89* 07/28/2014 1115   MCV 81.1 07/28/2014 1115   MCH 26.0 07/28/2014 1115   MCHC 32.0 07/28/2014 1115   RDW 19.0* 07/28/2014 1115   LYMPHSABS 0.4* 07/28/2014 1115   MONOABS 0.5 07/28/2014 1115   EOSABS 0.1 07/28/2014 1115   BASOSABS 0.0 07/28/2014 1115      Chemistry      Component Value Date/Time   NA 137 07/19/2014 0502   K 3.8 07/19/2014 0502   CL 107 07/19/2014 0502   CO2 26 07/19/2014 0502   BUN 29* 07/19/2014 0502   CREATININE 1.16* 07/19/2014 0502   CREATININE 1.37* 03/18/2014 1026      Component Value Date/Time   CALCIUM 8.8* 07/19/2014 0502   ALKPHOS 51 07/19/2014 0502   AST 115* 07/19/2014 0502   ALT 13* 07/19/2014 0502   BILITOT 2.6* 07/19/2014 0502      Lab Results  Component Value Date   IRON 25* 09/24/2013   TIBC 325 09/24/2013   FERRITIN 62 06/23/2014     ASSESSMENT AND PLAN:  Chronic anemia Hemoccult positive Stool History of PUD, ulcer at the ileocecal valve History of Gastric AVM CVA's Autoimmune Hepatitis Thrombocytopenia Rheumatoid arthritis Stage III CKD History of BMBX in 2012 that was "nonspecific"  THERAPY PLAN:    We will continue to monitor her counts closely. I advised the patient that her anemia is from multiple causes including GI related blood loss, kidney disease, iron deficiency. She has not had a bone marrow biopsy in some time and if her counts continue to worsen I feel that that may be needed.  She will continue with blood counts every 2 weeks. We may need to increase the frequency of this depending on her  counts moving forward. She feels very fatigued and wishes to proceed with transfusion this week. We will set this up for her. I will plan on seeing her back again in couple weeks with repeat laboratory studies.  I have increased her Aranesp to 80 g every 2 weeks. Certainly appreciate Dr. Olevia Perches help as well.  All questions were answered. The patient knows to call the clinic with any problems, questions or concerns. We can certainly see the patient much sooner if necessary. //  This document serves as a record of services personally performed by Ancil Linsey, MD. It was created on her behalf by Janace Hoard, a trained medical scribe. The creation of this record is based on the scribe's personal observations and the provider's statements to them. This document has been checked and approved by the attending provider.  I have reviewed the above documentation for accuracy and completeness, and I agree with the above.  This note is electronically  signed.  Kelby Fam. Whitney Muse, MD

## 2014-07-28 NOTE — Progress Notes (Signed)
Please see doctors encounter for more information 

## 2014-07-28 NOTE — Patient Instructions (Addendum)
[facility] at [facility] Discharge Instructions  RECOMMENDATIONS MADE BY THE CONSULTANT AND ANY TEST RESULTS WILL BE SENT TO YOUR REFERRING PHYSICIAN.  Exam and discussion by Dr. Galen Manila. Will give you some blood this week Increased your aranesp dosage to 80 mcg. Call with any concerns Blood transfusion tomorrow Labs and aranesp every 2 weeks Office visit in 2 months.   Thank you for choosing [facility] at [facility] to provide your oncology and hematology care.  To afford each patient quality time with our provider, please arrive at least 15 minutes before your scheduled appointment time.    You need to re-schedule your appointment should you arrive 10 or more minutes late.  We strive to give you quality time with our providers, and arriving late affects you and other patients whose appointments are after yours.  Also, if you no show three or more times for appointments you may be dismissed from the clinic at the providers discretion.     Again, thank you for choosing [facility].  Our hope is that these requests will decrease the amount of time that you wait before being seen by our physicians.       _____________________________________________________________  Should you have questions after your visit to [facility], please contact our office at [phone] between the hours of 8:30 a.m. and 4:30 p.m.  Voicemails left after 4:30 p.m. will not be returned until the following business day.  For prescription refill requests, have your pharmacy contact our office.

## 2014-07-29 ENCOUNTER — Encounter (HOSPITAL_BASED_OUTPATIENT_CLINIC_OR_DEPARTMENT_OTHER): Payer: Medicare Other

## 2014-07-29 ENCOUNTER — Encounter (HOSPITAL_COMMUNITY): Payer: Self-pay

## 2014-07-29 VITALS — BP 105/50 | HR 65 | Temp 98.0°F | Resp 18

## 2014-07-29 DIAGNOSIS — K922 Gastrointestinal hemorrhage, unspecified: Secondary | ICD-10-CM

## 2014-07-29 DIAGNOSIS — D631 Anemia in chronic kidney disease: Secondary | ICD-10-CM

## 2014-07-29 DIAGNOSIS — N183 Chronic kidney disease, stage 3 unspecified: Secondary | ICD-10-CM

## 2014-07-29 DIAGNOSIS — D500 Iron deficiency anemia secondary to blood loss (chronic): Secondary | ICD-10-CM | POA: Diagnosis not present

## 2014-07-29 MED ORDER — SODIUM CHLORIDE 0.9 % IV SOLN
250.0000 mL | Freq: Once | INTRAVENOUS | Status: AC
  Administered 2014-07-29: 250 mL via INTRAVENOUS

## 2014-07-29 MED ORDER — DIPHENHYDRAMINE HCL 25 MG PO CAPS
25.0000 mg | ORAL_CAPSULE | Freq: Once | ORAL | Status: AC
  Administered 2014-07-29: 25 mg via ORAL
  Filled 2014-07-29: qty 1

## 2014-07-29 MED ORDER — ACETAMINOPHEN 325 MG PO TABS
650.0000 mg | ORAL_TABLET | Freq: Once | ORAL | Status: AC
  Administered 2014-07-29: 650 mg via ORAL
  Filled 2014-07-29: qty 2

## 2014-07-29 MED ORDER — SODIUM CHLORIDE 0.9 % IJ SOLN: 10.0000 mL | INTRAMUSCULAR | Status: DC | PRN

## 2014-07-29 NOTE — Patient Instructions (Signed)
[facility] at [facility] Discharge Instructions  RECOMMENDATIONS MADE BY THE CONSULTANT AND ANY TEST RESULTS WILL BE SENT TO YOUR REFERRING PHYSICIAN.  Transfused 2 units of blood today Return as scheduled Please call the clinic if you have any questions or concerns  Thank you for choosing [facility] at [facility] to provide your oncology and hematology care.  To afford each patient quality time with our provider, please arrive at least 15 minutes before your scheduled appointment time.    You need to re-schedule your appointment should you arrive 10 or more minutes late.  We strive to give you quality time with our providers, and arriving late affects you and other patients whose appointments are after yours.  Also, if you no show three or more times for appointments you may be dismissed from the clinic at the providers discretion.     Again, thank you for choosing [facility].  Our hope is that these requests will decrease the amount of time that you wait before being seen by our physicians.       _____________________________________________________________  Should you have questions after your visit to [facility], please contact our office at [phone] between the hours of 8:30 a.m. and 4:30 p.m.  Voicemails left after 4:30 p.m. will not be returned until the following business day.  For prescription refill requests, have your pharmacy contact our office.

## 2014-07-29 NOTE — Progress Notes (Signed)
Eileen Santana Tolerated 2 units of blood today Discharged via wheelchair

## 2014-07-30 ENCOUNTER — Encounter (INDEPENDENT_AMBULATORY_CARE_PROVIDER_SITE_OTHER): Payer: Self-pay | Admitting: *Deleted

## 2014-07-30 ENCOUNTER — Other Ambulatory Visit (HOSPITAL_COMMUNITY): Payer: Self-pay | Admitting: Oncology

## 2014-07-30 ENCOUNTER — Telehealth: Payer: Self-pay | Admitting: Cardiology

## 2014-07-30 ENCOUNTER — Other Ambulatory Visit (INDEPENDENT_AMBULATORY_CARE_PROVIDER_SITE_OTHER): Payer: Self-pay | Admitting: *Deleted

## 2014-07-30 DIAGNOSIS — K754 Autoimmune hepatitis: Secondary | ICD-10-CM

## 2014-07-30 DIAGNOSIS — D500 Iron deficiency anemia secondary to blood loss (chronic): Secondary | ICD-10-CM

## 2014-07-30 DIAGNOSIS — R770 Abnormality of albumin: Secondary | ICD-10-CM

## 2014-07-30 LAB — TYPE AND SCREEN
ABO/RH(D): A POS
Antibody Screen: NEGATIVE
UNIT DIVISION: 0
Unit division: 0

## 2014-07-30 LAB — FERRITIN: Ferritin: 26 ng/mL (ref 11–307)

## 2014-07-30 NOTE — Telephone Encounter (Signed)
Pls call the pt's daughter Marylu Lund and give clarification on how she should be taking her Metoprolol -the pharmacy is telling them something different

## 2014-07-30 NOTE — Telephone Encounter (Signed)
I spoke to [place] and clarified that Ms.Mccarroll should be taking metoprolol 75 mg twice a day

## 2014-07-31 ENCOUNTER — Encounter (INDEPENDENT_AMBULATORY_CARE_PROVIDER_SITE_OTHER): Payer: Self-pay | Admitting: *Deleted

## 2014-08-02 ENCOUNTER — Other Ambulatory Visit (INDEPENDENT_AMBULATORY_CARE_PROVIDER_SITE_OTHER): Payer: Self-pay | Admitting: Internal Medicine

## 2014-08-06 ENCOUNTER — Encounter (HOSPITAL_COMMUNITY): Payer: Medicare Other | Attending: Oncology

## 2014-08-06 ENCOUNTER — Encounter (HOSPITAL_COMMUNITY): Payer: Self-pay

## 2014-08-06 VITALS — BP 97/73 | HR 66 | Temp 97.7°F | Resp 18

## 2014-08-06 DIAGNOSIS — N289 Disorder of kidney and ureter, unspecified: Secondary | ICD-10-CM | POA: Insufficient documentation

## 2014-08-06 DIAGNOSIS — D500 Iron deficiency anemia secondary to blood loss (chronic): Secondary | ICD-10-CM | POA: Insufficient documentation

## 2014-08-06 DIAGNOSIS — D509 Iron deficiency anemia, unspecified: Secondary | ICD-10-CM | POA: Insufficient documentation

## 2014-08-06 DIAGNOSIS — D649 Anemia, unspecified: Secondary | ICD-10-CM | POA: Insufficient documentation

## 2014-08-06 DIAGNOSIS — D61818 Other pancytopenia: Secondary | ICD-10-CM | POA: Insufficient documentation

## 2014-08-06 MED ORDER — SODIUM CHLORIDE 0.9 % IV SOLN
Freq: Once | INTRAVENOUS | Status: AC
Start: 2014-08-06 — End: 2014-08-06
  Administered 2014-08-06: 15:00:00 via INTRAVENOUS

## 2014-08-06 MED ORDER — SODIUM CHLORIDE 0.9 % IV SOLN
510.0000 mg | Freq: Once | INTRAVENOUS | Status: AC
  Administered 2014-08-06: 510 mg via INTRAVENOUS
  Filled 2014-08-06: qty 17

## 2014-08-06 MED ORDER — SODIUM CHLORIDE 0.9 % IJ SOLN: 10.0000 mL | Freq: Once | INTRAMUSCULAR | Status: DC

## 2014-08-06 NOTE — Progress Notes (Signed)
Tolerated infusion w/o adverse reaction.  VSS.  A&Ox4  Discharged via wheelchair in c/o daughter for transport home.

## 2014-08-06 NOTE — Patient Instructions (Signed)
[facility] at [facility] Discharge Instructions  RECOMMENDATIONS MADE BY THE CONSULTANT AND ANY TEST RESULTS WILL BE SENT TO YOUR REFERRING PHYSICIAN.  Iron infusion (Feraheme) today. Return as scheduled for lab work and office visit.  Thank you for choosing [facility] at [facility] to provide your oncology and hematology care.  To afford each patient quality time with our provider, please arrive at least 15 minutes before your scheduled appointment time.    You need to re-schedule your appointment should you arrive 10 or more minutes late.  We strive to give you quality time with our providers, and arriving late affects you and other patients whose appointments are after yours.  Also, if you no show three or more times for appointments you may be dismissed from the clinic at the providers discretion.     Again, thank you for choosing [facility].  Our hope is that these requests will decrease the amount of time that you wait before being seen by our physicians.       _____________________________________________________________  Should you have questions after your visit to [facility], please contact our office at [phone] between the hours of 8:30 a.m. and 4:30 p.m.  Voicemails left after 4:30 p.m. will not be returned until the following business day.  For prescription refill requests, have your pharmacy contact our office.

## 2014-08-12 ENCOUNTER — Encounter (HOSPITAL_BASED_OUTPATIENT_CLINIC_OR_DEPARTMENT_OTHER): Payer: Medicare Other

## 2014-08-12 ENCOUNTER — Other Ambulatory Visit (HOSPITAL_COMMUNITY)
Admission: AD | Admit: 2014-08-12 | Discharge: 2014-08-12 | Disposition: A | Discharge status: Home / Self Care | Payer: Medicare Other | Source: Other Acute Inpatient Hospital | Attending: Internal Medicine | Admitting: Internal Medicine

## 2014-08-12 VITALS — BP 103/89 | HR 65 | Temp 98.7°F | Resp 16

## 2014-08-12 DIAGNOSIS — R770 Abnormality of albumin: Secondary | ICD-10-CM | POA: Diagnosis present

## 2014-08-12 DIAGNOSIS — D509 Iron deficiency anemia, unspecified: Secondary | ICD-10-CM | POA: Diagnosis present

## 2014-08-12 DIAGNOSIS — D500 Iron deficiency anemia secondary to blood loss (chronic): Secondary | ICD-10-CM | POA: Diagnosis present

## 2014-08-12 DIAGNOSIS — D631 Anemia in chronic kidney disease: Secondary | ICD-10-CM

## 2014-08-12 DIAGNOSIS — N289 Disorder of kidney and ureter, unspecified: Secondary | ICD-10-CM | POA: Diagnosis present

## 2014-08-12 DIAGNOSIS — D649 Anemia, unspecified: Secondary | ICD-10-CM | POA: Diagnosis present

## 2014-08-12 DIAGNOSIS — D61818 Other pancytopenia: Secondary | ICD-10-CM | POA: Diagnosis present

## 2014-08-12 DIAGNOSIS — D638 Anemia in other chronic diseases classified elsewhere: Secondary | ICD-10-CM

## 2014-08-12 DIAGNOSIS — N183 Chronic kidney disease, stage 3 (moderate): Secondary | ICD-10-CM

## 2014-08-12 DIAGNOSIS — I4891 Unspecified atrial fibrillation: Secondary | ICD-10-CM | POA: Insufficient documentation

## 2014-08-12 LAB — CBC
HEMATOCRIT: 30.2 % — AB (ref 36.0–46.0)
Hemoglobin: 9.5 g/dL — ABNORMAL LOW (ref 12.0–15.0)
MCH: 26.9 pg (ref 26.0–34.0)
MCHC: 31.5 g/dL (ref 30.0–36.0)
MCV: 85.6 fL (ref 78.0–100.0)
Platelets: 105 10*3/uL — ABNORMAL LOW (ref 150–400)
RBC: 3.53 MIL/uL — ABNORMAL LOW (ref 3.87–5.11)
RDW: 21.6 % — AB (ref 11.5–15.5)
WBC: 3.5 10*3/uL — AB (ref 4.0–10.5)

## 2014-08-12 LAB — PROTIME-INR
INR: 1.12 (ref 0.00–1.49)
PROTHROMBIN TIME: 14.6 s (ref 11.6–15.2)

## 2014-08-12 MED ORDER — DARBEPOETIN ALFA 100 MCG/0.5ML IJ SOSY
80.0000 ug | PREFILLED_SYRINGE | Freq: Once | INTRAMUSCULAR | Status: AC
  Administered 2014-08-12: 80 ug via SUBCUTANEOUS
  Filled 2014-08-12: qty 0.5

## 2014-08-12 NOTE — Patient Instructions (Signed)
[facility] at [facility] Discharge Instructions  RECOMMENDATIONS MADE BY THE CONSULTANT AND ANY TEST RESULTS WILL BE SENT TO YOUR REFERRING PHYSICIAN.  Hemoglobin 9.5 today. Aranesp 80 mcg injection given as ordered. Return as scheduled.  Thank you for choosing [facility] at [facility] to provide your oncology and hematology care.  To afford each patient quality time with our provider, please arrive at least 15 minutes before your scheduled appointment time.    You need to re-schedule your appointment should you arrive 10 or more minutes late.  We strive to give you quality time with our providers, and arriving late affects you and other patients whose appointments are after yours.  Also, if you no show three or more times for appointments you may be dismissed from the clinic at the providers discretion.     Again, thank you for choosing [facility].  Our hope is that these requests will decrease the amount of time that you wait before being seen by our physicians.       _____________________________________________________________  Should you have questions after your visit to [facility], please contact our office at [phone] between the hours of 8:30 a.m. and 4:30 p.m.  Voicemails left after 4:30 p.m. will not be returned until the following business day.  For prescription refill requests, have your pharmacy contact our office.    

## 2014-08-12 NOTE — Progress Notes (Signed)
Eileen Santana presents today for injection per MD orders. Aranesp 80 mcg administered SQ in right Abdomen. Administration without incident. Patient tolerated well.

## 2014-08-13 LAB — HEPATIC FUNCTION PANEL
ALBUMIN: 2.7 g/dL — AB (ref 3.6–5.1)
ALK PHOS: 55 U/L (ref 33–130)
ALT: 13 U/L (ref 6–29)
AST: 184 U/L — ABNORMAL HIGH (ref 10–35)
BILIRUBIN TOTAL: 0.8 mg/dL (ref 0.2–1.2)
Bilirubin, Direct: 0.3 mg/dL — ABNORMAL HIGH (ref <=0.2)
Indirect Bilirubin: 0.5 mg/dL (ref 0.2–1.2)
TOTAL PROTEIN: 6 g/dL — AB (ref 6.1–8.1)

## 2014-08-13 NOTE — Progress Notes (Signed)
Labs drawn

## 2014-08-21 ENCOUNTER — Other Ambulatory Visit (HOSPITAL_COMMUNITY): Payer: Self-pay

## 2014-08-21 DIAGNOSIS — D638 Anemia in other chronic diseases classified elsewhere: Secondary | ICD-10-CM

## 2014-08-25 ENCOUNTER — Encounter (HOSPITAL_COMMUNITY): Payer: Medicare Other | Attending: Hematology & Oncology

## 2014-08-25 ENCOUNTER — Encounter (HOSPITAL_COMMUNITY): Payer: Self-pay

## 2014-08-25 ENCOUNTER — Encounter (HOSPITAL_BASED_OUTPATIENT_CLINIC_OR_DEPARTMENT_OTHER): Payer: Medicare Other

## 2014-08-25 VITALS — BP 109/66 | HR 64 | Temp 98.4°F | Resp 18

## 2014-08-25 DIAGNOSIS — D61818 Other pancytopenia: Secondary | ICD-10-CM

## 2014-08-25 DIAGNOSIS — D631 Anemia in chronic kidney disease: Secondary | ICD-10-CM | POA: Diagnosis not present

## 2014-08-25 DIAGNOSIS — D500 Iron deficiency anemia secondary to blood loss (chronic): Secondary | ICD-10-CM | POA: Diagnosis not present

## 2014-08-25 DIAGNOSIS — N183 Chronic kidney disease, stage 3 unspecified: Secondary | ICD-10-CM

## 2014-08-25 DIAGNOSIS — D638 Anemia in other chronic diseases classified elsewhere: Secondary | ICD-10-CM

## 2014-08-25 LAB — CBC WITH DIFFERENTIAL/PLATELET
BASOS ABS: 0 10*3/uL (ref 0.0–0.1)
Basophils Relative: 2 % — ABNORMAL HIGH (ref 0–1)
Eosinophils Absolute: 0.1 10*3/uL (ref 0.0–0.7)
Eosinophils Relative: 5 % (ref 0–5)
HEMATOCRIT: 26.1 % — AB (ref 36.0–46.0)
Hemoglobin: 8.4 g/dL — ABNORMAL LOW (ref 12.0–15.0)
LYMPHS PCT: 15 % (ref 12–46)
Lymphs Abs: 0.4 10*3/uL — ABNORMAL LOW (ref 0.7–4.0)
MCH: 27.7 pg (ref 26.0–34.0)
MCHC: 32.2 g/dL (ref 30.0–36.0)
MCV: 86.1 fL (ref 78.0–100.0)
MONO ABS: 0.5 10*3/uL (ref 0.1–1.0)
MONOS PCT: 21 % — AB (ref 3–12)
NEUTROS ABS: 1.4 10*3/uL — AB (ref 1.7–7.7)
Neutrophils Relative %: 58 % (ref 43–77)
Platelets: 112 10*3/uL — ABNORMAL LOW (ref 150–400)
RBC: 3.03 MIL/uL — ABNORMAL LOW (ref 3.87–5.11)
RDW: 21.8 % — ABNORMAL HIGH (ref 11.5–15.5)
WBC: 2.4 10*3/uL — ABNORMAL LOW (ref 4.0–10.5)

## 2014-08-25 LAB — FERRITIN: Ferritin: 113 ng/mL (ref 11–307)

## 2014-08-25 MED ORDER — DARBEPOETIN ALFA 100 MCG/0.5ML IJ SOSY
PREFILLED_SYRINGE | INTRAMUSCULAR | Status: AC
  Filled 2014-08-25: qty 0.5

## 2014-08-25 MED ORDER — DARBEPOETIN ALFA 100 MCG/0.5ML IJ SOSY
80.0000 ug | PREFILLED_SYRINGE | Freq: Once | INTRAMUSCULAR | Status: AC
  Administered 2014-08-25: 80 ug via SUBCUTANEOUS

## 2014-08-25 NOTE — Patient Instructions (Addendum)
[facility] at [facility] Discharge Instructions  RECOMMENDATIONS MADE BY THE CONSULTANT AND ANY TEST RESULTS WILL BE SENT TO YOUR REFERRING PHYSICIAN.  Hemoglobin 8.4  aranesp today Return as scheduled Please call the clinic if you have any questions or concerns   Thank you for choosing  [facility] at [facility] to provide your oncology and hematology care.  To afford each patient quality time with our provider, please arrive at least 15 minutes before your scheduled appointment time.    You need to re-schedule your appointment should you arrive 10 or more minutes late.  We strive to give you quality time with our providers, and arriving late affects you and other patients whose appointments are after yours.  Also, if you no show three or more times for appointments you may be dismissed from the clinic at the providers discretion.     Again, thank you for choosing [facility].  Our hope is that these requests will decrease the amount of time that you wait before being seen by our physicians.       _____________________________________________________________  Should you have questions after your visit to [facility], please contact our office at [phone] between the hours of 8:30 a.m. and 4:30 p.m.  Voicemails left after 4:30 p.m. will not be returned until the following business day.  For prescription refill requests, have your pharmacy contact our office.

## 2014-08-25 NOTE — Progress Notes (Signed)
Eileen Santana's reason for visit today is for an injection and labs as scheduled per MD orders.  Labs were drawn prior to administration of ordered medication.    Eileen Santana also received aranesp 80 mcg per MD orders; see MAR for administration details.  Eileen Santana tolerated all procedures well and without incident; questions were answered and patient was discharged.   pts hemoglobin 8.4, spoke with Dr Galen Manila about her hemoglobin.  Aranesp given and to return in 2 weeks to re-check hemoglobin.  If pt starts to feel worse then call the clinic to have a transfusion set up.

## 2014-08-26 NOTE — Progress Notes (Signed)
Labs drawn

## 2014-09-09 ENCOUNTER — Encounter (HOSPITAL_COMMUNITY): Payer: Medicare Other | Attending: Internal Medicine

## 2014-09-09 ENCOUNTER — Encounter (HOSPITAL_COMMUNITY): Payer: Self-pay

## 2014-09-09 ENCOUNTER — Encounter (HOSPITAL_BASED_OUTPATIENT_CLINIC_OR_DEPARTMENT_OTHER): Payer: Medicare Other

## 2014-09-09 VITALS — BP 115/54 | HR 66 | Temp 98.1°F | Resp 16

## 2014-09-09 DIAGNOSIS — N183 Chronic kidney disease, stage 3 unspecified: Secondary | ICD-10-CM

## 2014-09-09 DIAGNOSIS — D631 Anemia in chronic kidney disease: Secondary | ICD-10-CM | POA: Diagnosis not present

## 2014-09-09 DIAGNOSIS — D61818 Other pancytopenia: Secondary | ICD-10-CM | POA: Diagnosis present

## 2014-09-09 DIAGNOSIS — D638 Anemia in other chronic diseases classified elsewhere: Secondary | ICD-10-CM

## 2014-09-09 DIAGNOSIS — D500 Iron deficiency anemia secondary to blood loss (chronic): Secondary | ICD-10-CM | POA: Insufficient documentation

## 2014-09-09 LAB — CBC WITH DIFFERENTIAL/PLATELET
BASOS PCT: 1 % (ref 0–1)
Basophils Absolute: 0 10*3/uL (ref 0.0–0.1)
Eosinophils Absolute: 0.1 10*3/uL (ref 0.0–0.7)
Eosinophils Relative: 3 % (ref 0–5)
HEMATOCRIT: 25 % — AB (ref 36.0–46.0)
HEMOGLOBIN: 8 g/dL — AB (ref 12.0–15.0)
LYMPHS ABS: 0.5 10*3/uL — AB (ref 0.7–4.0)
Lymphocytes Relative: 13 % (ref 12–46)
MCH: 26.2 pg (ref 26.0–34.0)
MCHC: 32 g/dL (ref 30.0–36.0)
MCV: 82 fL (ref 78.0–100.0)
Monocytes Absolute: 0.7 10*3/uL (ref 0.1–1.0)
Monocytes Relative: 20 % — ABNORMAL HIGH (ref 3–12)
NEUTROS ABS: 2.2 10*3/uL (ref 1.7–7.7)
NEUTROS PCT: 63 % (ref 43–77)
Platelets: 111 10*3/uL — ABNORMAL LOW (ref 150–400)
RBC: 3.05 MIL/uL — AB (ref 3.87–5.11)
RDW: 20.5 % — ABNORMAL HIGH (ref 11.5–15.5)
WBC: 3.5 10*3/uL — AB (ref 4.0–10.5)

## 2014-09-09 LAB — PREPARE RBC (CROSSMATCH)

## 2014-09-09 MED ORDER — DARBEPOETIN ALFA 100 MCG/0.5ML IJ SOSY
PREFILLED_SYRINGE | INTRAMUSCULAR | Status: AC
  Filled 2014-09-09: qty 0.5

## 2014-09-09 MED ORDER — DARBEPOETIN ALFA 100 MCG/0.5ML IJ SOSY
80.0000 ug | PREFILLED_SYRINGE | Freq: Once | INTRAMUSCULAR | Status: AC
  Administered 2014-09-09: 80 ug via SUBCUTANEOUS

## 2014-09-09 NOTE — Patient Instructions (Signed)
[facility] at [facility] Discharge Instructions  RECOMMENDATIONS MADE BY THE CONSULTANT AND ANY TEST RESULTS WILL BE SENT TO YOUR REFERRING PHYSICIAN.  Aranesp injection today. Return as scheduled on Thursday, 09/11/14, for blood transfusion.  Thank you for choosing [facility] at [facility] to provide your oncology and hematology care.  To afford each patient quality time with our provider, please arrive at least 15 minutes before your scheduled appointment time.    You need to re-schedule your appointment should you arrive 10 or more minutes late.  We strive to give you quality time with our providers, and arriving late affects you and other patients whose appointments are after yours.  Also, if you no show three or more times for appointments you may be dismissed from the clinic at the providers discretion.     Again, thank you for choosing [facility].  Our hope is that these requests will decrease the amount of time that you wait before being seen by our physicians.       _____________________________________________________________  Should you have questions after your visit to [facility], please contact our office at [phone] between the hours of 8:30 a.m. and 4:30 p.m.  Voicemails left after 4:30 p.m. will not be returned until the following business day.  For prescription refill requests, have your pharmacy contact our office.

## 2014-09-09 NOTE — Progress Notes (Signed)
Discussed hgb results (8.0) with Dr. Galen Manila.  Per MD, give Aranesp today and schedule pt for blood transfusion if she so wishes. Pt reports feeling weak and tired and states she would like to have a blood transfusion.  Scheduled for Thursday morning at 0830.    Eileen Santana presents today for injection per the provider's orders.  Aranesp administration without incident; see MAR for injection details.  Patient tolerated procedure well and without incident.  No questions or complaints noted at this time.

## 2014-09-09 NOTE — Progress Notes (Signed)
Labs drawn

## 2014-09-11 ENCOUNTER — Encounter (HOSPITAL_BASED_OUTPATIENT_CLINIC_OR_DEPARTMENT_OTHER): Payer: Medicare Other

## 2014-09-11 VITALS — BP 115/51 | HR 71 | Temp 98.0°F | Resp 16

## 2014-09-11 DIAGNOSIS — D631 Anemia in chronic kidney disease: Secondary | ICD-10-CM | POA: Diagnosis not present

## 2014-09-11 DIAGNOSIS — N183 Chronic kidney disease, stage 3 (moderate): Secondary | ICD-10-CM | POA: Diagnosis not present

## 2014-09-11 MED ORDER — DIPHENHYDRAMINE HCL 25 MG PO CAPS
25.0000 mg | ORAL_CAPSULE | Freq: Once | ORAL | Status: AC
  Administered 2014-09-11: 25 mg via ORAL

## 2014-09-11 MED ORDER — SODIUM CHLORIDE 0.9 % IJ SOLN
10.0000 mL | INTRAMUSCULAR | Status: AC | PRN
  Administered 2014-09-11: 10 mL

## 2014-09-11 MED ORDER — ACETAMINOPHEN 325 MG PO TABS
ORAL_TABLET | ORAL | Status: AC
  Filled 2014-09-11: qty 2

## 2014-09-11 MED ORDER — SODIUM CHLORIDE 0.9 % IV SOLN
250.0000 mL | Freq: Once | INTRAVENOUS | Status: AC
  Administered 2014-09-11: 250 mL via INTRAVENOUS

## 2014-09-11 MED ORDER — ACETAMINOPHEN 325 MG PO TABS
650.0000 mg | ORAL_TABLET | Freq: Once | ORAL | Status: AC
  Administered 2014-09-11: 650 mg via ORAL

## 2014-09-11 MED ORDER — DIPHENHYDRAMINE HCL 25 MG PO CAPS
ORAL_CAPSULE | ORAL | Status: AC
  Filled 2014-09-11: qty 1

## 2014-09-11 NOTE — Patient Instructions (Signed)
[facility] at [facility] Discharge Instructions  RECOMMENDATIONS MADE BY THE CONSULTANT AND ANY TEST RESULTS WILL BE SENT TO YOUR REFERRING PHYSICIAN.  Today you received 2 units packed red blood cell transfusion. Return as scheduled.  Thank you for choosing [facility] at [facility] to provide your oncology and hematology care.  To afford each patient quality time with our provider, please arrive at least 15 minutes before your scheduled appointment time.    You need to re-schedule your appointment should you arrive 10 or more minutes late.  We strive to give you quality time with our providers, and arriving late affects you and other patients whose appointments are after yours.  Also, if you no show three or more times for appointments you may be dismissed from the clinic at the providers discretion.     Again, thank you for choosing [facility].  Our hope is that these requests will decrease the amount of time that you wait before being seen by our physicians.       _____________________________________________________________  Should you have questions after your visit to [facility], please contact our office at [phone] between the hours of 8:30 a.m. and 4:30 p.m.  Voicemails left after 4:30 p.m. will not be returned until the following business day.  For prescription refill requests, have your pharmacy contact our office.    

## 2014-09-11 NOTE — Progress Notes (Signed)
1045 Tolerated 1st unit packed red blood cell transfusion without s/s adverse reaction.  1300 Tolerated 2nd unit packed red blood cell transfusion without s/s adverse reaction.

## 2014-09-12 LAB — TYPE AND SCREEN
ABO/RH(D): A POS
ANTIBODY SCREEN: NEGATIVE
UNIT DIVISION: 0
Unit division: 0

## 2014-09-19 ENCOUNTER — Telehealth (HOSPITAL_COMMUNITY): Payer: Self-pay | Admitting: Hematology & Oncology

## 2014-09-22 ENCOUNTER — Encounter (HOSPITAL_BASED_OUTPATIENT_CLINIC_OR_DEPARTMENT_OTHER): Payer: Medicare Other

## 2014-09-22 ENCOUNTER — Other Ambulatory Visit (HOSPITAL_COMMUNITY): Payer: Self-pay | Admitting: Oncology

## 2014-09-22 ENCOUNTER — Encounter (HOSPITAL_BASED_OUTPATIENT_CLINIC_OR_DEPARTMENT_OTHER): Payer: Medicare Other | Admitting: Hematology & Oncology

## 2014-09-22 ENCOUNTER — Encounter (HOSPITAL_COMMUNITY): Payer: Self-pay | Admitting: Hematology & Oncology

## 2014-09-22 ENCOUNTER — Encounter (HOSPITAL_COMMUNITY): Payer: Medicare Other

## 2014-09-22 VITALS — BP 114/87 | HR 76 | Temp 98.7°F | Resp 18 | Wt 154.3 lb

## 2014-09-22 DIAGNOSIS — D631 Anemia in chronic kidney disease: Secondary | ICD-10-CM | POA: Diagnosis not present

## 2014-09-22 DIAGNOSIS — D61818 Other pancytopenia: Secondary | ICD-10-CM

## 2014-09-22 DIAGNOSIS — N189 Chronic kidney disease, unspecified: Secondary | ICD-10-CM

## 2014-09-22 DIAGNOSIS — N183 Chronic kidney disease, stage 3 (moderate): Secondary | ICD-10-CM | POA: Diagnosis not present

## 2014-09-22 DIAGNOSIS — D638 Anemia in other chronic diseases classified elsewhere: Secondary | ICD-10-CM | POA: Diagnosis not present

## 2014-09-22 DIAGNOSIS — D500 Iron deficiency anemia secondary to blood loss (chronic): Secondary | ICD-10-CM

## 2014-09-22 DIAGNOSIS — D509 Iron deficiency anemia, unspecified: Secondary | ICD-10-CM | POA: Diagnosis not present

## 2014-09-22 LAB — CBC WITH DIFFERENTIAL/PLATELET
Basophils Absolute: 0 10*3/uL (ref 0.0–0.1)
Basophils Relative: 1 %
EOS PCT: 5 %
Eosinophils Absolute: 0.1 10*3/uL (ref 0.0–0.7)
HCT: 28.6 % — ABNORMAL LOW (ref 36.0–46.0)
Hemoglobin: 9.2 g/dL — ABNORMAL LOW (ref 12.0–15.0)
LYMPHS PCT: 19 %
Lymphs Abs: 0.5 10*3/uL — ABNORMAL LOW (ref 0.7–4.0)
MCH: 25 pg — ABNORMAL LOW (ref 26.0–34.0)
MCHC: 32.2 g/dL (ref 30.0–36.0)
MCV: 77.7 fL — AB (ref 78.0–100.0)
Monocytes Absolute: 0.5 10*3/uL (ref 0.1–1.0)
Monocytes Relative: 18 %
NEUTROS ABS: 1.7 10*3/uL (ref 1.7–7.7)
NEUTROS PCT: 57 %
Platelets: 105 10*3/uL — ABNORMAL LOW (ref 150–400)
RBC: 3.68 MIL/uL — ABNORMAL LOW (ref 3.87–5.11)
RDW: 21.4 % — ABNORMAL HIGH (ref 11.5–15.5)
WBC: 2.8 10*3/uL — ABNORMAL LOW (ref 4.0–10.5)

## 2014-09-22 LAB — FERRITIN: Ferritin: 31 ng/mL (ref 11–307)

## 2014-09-22 MED ORDER — DARBEPOETIN ALFA 100 MCG/0.5ML IJ SOSY
PREFILLED_SYRINGE | INTRAMUSCULAR | Status: AC
  Filled 2014-09-22: qty 0.5

## 2014-09-22 MED ORDER — DARBEPOETIN ALFA 100 MCG/0.5ML IJ SOSY
80.0000 ug | PREFILLED_SYRINGE | Freq: Once | INTRAMUSCULAR | Status: AC
  Administered 2014-09-22: 80 ug via SUBCUTANEOUS

## 2014-09-22 MED ORDER — HYDROCODONE-ACETAMINOPHEN 2.5-325 MG PO TABS: 1.0000 | ORAL_TABLET | Freq: Three times a day (TID) | ORAL | Status: DC | PRN

## 2014-09-22 NOTE — Progress Notes (Signed)
      HILL,GERALD K, MD [address] [place] [address]  Chronic anemia Hemoccult positive Stool History of PUD, ulcer at the ileocecal valve History of Gastric AVM CVA's Autoimmune Hepatitis Thrombocytopenia Rheumatoid arthritis Stage III CKD Pillcam 07/15/2014 with single gastric and multiple proximal jejunal AVM's, mild portal gastropathy   CURRENT THERAPY: Intermittent IV Ferahame PRN. ESA therapy  INTERVAL HISTORY: Eileen Santana 81 y.o. female returns for followup of anemia of chronic disease with rheumatoid arthritis, anemia of chronic renal disease, and iron deficiency with blood loss secondary to intestinal AV malformations, chronic kidney disease, hypersplenism  Hematologically, she denies any complaints and ROS questioning is negative.   The patient is here today with her daughter.  She has been depressed lately about having to rely on her children to assist her with her different medical conditions.  She also notes that there are many things that she would physically like to do however she is not able to.  Her daughter notes that this emotional state began about a week ago.  She also notes that she is very nervous at times.  The patient has complaints of a pain between her hip and stomach that does not bother her while seated but is very painful when she stands up and walks.  This concerns her.  She states that she would not want to add any more diagnosed issues to her health.  She denies problems with her appetite, sleep, and bowel movements.  Her primary is Dr. Hill.  She questions if she is taking too much medication.     Past Medical History  Diagnosis Date  . History of GI bleed     Associated with NSAIDS  . Iron deficiency anemia     Transfusion dependent  . DJD (degenerative joint disease)   . Essential hypertension, benign   . History of colitis   . Ileitis   . Pancytopenia   . Autoimmune hepatitis     With leukocytoclastic vasculitis  . Heart  block AV second degree March 2013    a. Cardiology consult note 06/2013: "Question of previous second degree heart block, although review of cardiology notes indicates that this may have been actually blocked PACs when the patient was on verapamil."  . Bronchitis   . Steroid-induced myopathy   . Renal calculus 08/12/2011  . Rheumatoid arthritis(714.0)   . Colon ulcer 04/2010    NSAID related  . Gastric ulcer 04/2010    NSAID related  . UGI bleed 09/20/2011    Focal area of gastritis oozing of blood  . Gastric AVM 09/20/2011  . Candida esophagitis 09/20/2011  . Paroxysmal atrial fibrillation     Not on anticoag 2/2 hx of GIB/AVM  . CKD (chronic kidney disease), stage III   . Cholestatic hepatitis   . UTI (lower urinary tract infection)     history  . Paroxysmal atrial flutter     Not on anticoag 2/2 hx of GIB/AVM - dx 06/2013.  . Multifocal atrial tachycardia   . Hypertrophic cardiomyopathy     Echo 03/2011: severe LVH suggesting possble infiltrate cardiomyopathy or advanced hypertensive heart disease, increased  echogenicity of the ventricular septum as well as the pericardium, EF >70% with end systolicmid-cavitary obliteration of the ventricle, stage 1 DD, mild MR, small IVC.  . Hypomagnesemia   . Chronic renal disease, stage 3, moderately decreased glomerular filtration rate between 30-59 mL/min/1.73 square meter 02/03/2014  . Anemia of chronic disease 11/25/2011  .   Anemia of chronic renal failure, stage 3 (moderate) 02/03/2014  . Low back pain   . 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 Biatrial enlargement 04/04/2014  . History of pneumonia 12/2013  . Atrial fibrillation     has Other pancytopenia; Sedimentation rate elevation; Elevated liver enzymes; GI bleed; Microcytic anemia; Orthostatic hypotension; Bradycardia; Renal failure; Autoimmune hepatitis; HTN (hypertension), benign; Abdominal pain, acute, generalized; Nausea; Diarrhea; Hypomagnesemia; Hypokalemia; Prolonged Q-T interval on ECG; UTI  (urinary tract infection); Gallstones; Thrombocytopenia; Renal calculus; Chronic back pain; Leg pain; Rheumatoid arthritis; Acute renal insufficiency; Hypoalbuminemia; Guaiac positive stools; UGI bleed; Gastric AVM; Candida esophagitis; Anemia of chronic disease; PAT (paroxysmal atrial tachycardia); Hypertrophic cardiomyopathy; Hypersplenism; Iron deficiency anemia due to chronic blood loss; AVM (congenital arteriovenous malformation); Atrial tachycardia; Atrial flutter; Atrial fibrillation with RVR; Healthcare-associated pneumonia; COPD (chronic obstructive pulmonary disease); HCAP (healthcare-associated pneumonia); Atrial fibrillation with rapid ventricular response; Atrial flutter with rapid ventricular response; Chronic renal disease, stage 3, moderately decreased glomerular filtration rate between 30-59 mL/min/1.73 square meter; Anemia of chronic renal failure, stage 3 (moderate); TIA (transient ischemic attack); Adrenal insufficiency; Aphasia; Stroke, acute, embolic; Biatrial enlargement; and Blood loss anemia on her problem list.     is allergic to iohexol; ivp dye; naprosyn; red blood cells; verapamil; vicodin; and sulfa antibiotics.  We administered Darbepoetin Alfa.  Past Surgical History  Procedure Laterality Date  . Colonoscopy  04/2010  . Upper gastrointestinal endoscopy  04/2010  . Esophagogastroduodenoscopy  09/20/2011    Status post APC.  . Esophagogastroduodenoscopy  09/20/2011    Procedure: ESOPHAGOGASTRODUODENOSCOPY (EGD);  Surgeon: Najeeb U Rehman, MD;  [facility];  Service: Endoscopy;  Laterality: N/A;  . Cyst removal hand      Elbow  . Sebaceous cyst removed      bilateral elbows  . Colonoscopy N/A 07/04/2014    Procedure: COLONOSCOPY;  Surgeon: Najeeb U Rehman, MD;  Location: [facility];  Service: Endoscopy;  Laterality: N/A;  730  . Esophagogastroduodenoscopy N/A 07/04/2014    Procedure: ESOPHAGOGASTRODUODENOSCOPY (EGD);  Surgeon: Najeeb U Rehman, MD;  [facility]:  [facility];  Service: Endoscopy;  Laterality: N/A;  . Givens capsule study N/A 07/14/2014    Procedure: GIVENS CAPSULE STUDY;  Surgeon: Najeeb U Rehman, MD;  Location: [facility];  Service: Endoscopy;  Laterality: N/A;  730    Denies any headaches, dizziness, double vision, fevers, chills, night sweats, nausea, vomiting, diarrhea, constipation, chest pain, heart palpitations, shortness of breath, blood in stool, black tarry stool, urinary pain, urinary burning, urinary frequency, hematuria. 14 point review of systems was performed and is negative except as detailed under history of present illness and above    PHYSICAL EXAMINATION  ECOG PERFORMANCE STATUS: 2 - Symptomatic, <50% confined to bed  Filed Vitals:   09/22/14 0958  BP: 114/87  Pulse: 76  Temp: 98.7 F (37.1 C)  Resp: 18    GENERAL:alert, no distress, well nourished, well developed, comfortable, cooperative. Here with her daughter SKIN: skin color, texture, turgor are normal, no rashes or significant lesions HEAD: Normocephalic, No masses, lesions, tenderness or abnormalities EYES: normal, PERRLA, EOMI, Conjunctiva are pink and non-injected EARS: External ears normal OROPHARYNX:lips, buccal mucosa, and tongue normal and mucous membranes are moist  NECK: supple, no adenopathy, thyroid normal size, non-tender, without nodularity, trachea midline LYMPH:  no palpable lymphadenopathy BREAST:not examined LUNGS: clear to auscultation  HEART: regular rate & rhythm, no murmurs and no gallops ABDOMEN:abdomen soft and normal bowel sounds. NO rebound or guarding. No palpable   liver or spleen.  BACK: Back symmetric, no curvature. EXTREMITIES:less then 2 second capillary refill, no joint deformities, effusion, or inflammation, no skin discoloration, no cyanosis  NEURO: alert & oriented x 3 with fluent speech, no focal motor/sensory deficits, uses a cane to ambulate to the table   LABORATORY DATA: I have reviewed the data as  listed.  CBC    Component Value Date/Time   WBC 2.8* 09/22/2014 1012   RBC 3.68* 09/22/2014 1012   RBC 3.62* 03/16/2012 0937   HGB 9.2* 09/22/2014 1012   HCT 28.6* 09/22/2014 1012   PLT PENDING 09/22/2014 1012   MCV 77.7* 09/22/2014 1012   MCH 25.0* 09/22/2014 1012   MCHC 32.2 09/22/2014 1012   RDW 21.4* 09/22/2014 1012   LYMPHSABS PENDING 09/22/2014 1012   MONOABS PENDING 09/22/2014 1012   EOSABS PENDING 09/22/2014 1012   BASOSABS PENDING 09/22/2014 1012      Chemistry      Component Value Date/Time   NA 137 07/19/2014 0502   K 3.8 07/19/2014 0502   CL 107 07/19/2014 0502   CO2 26 07/19/2014 0502   BUN 29* 07/19/2014 0502   CREATININE 1.16* 07/19/2014 0502   CREATININE 1.37* 03/18/2014 1026      Component Value Date/Time   CALCIUM 8.8* 07/19/2014 0502   ALKPHOS 55 08/12/2014 1330   AST 184* 08/12/2014 1330   ALT 13 08/12/2014 1330   BILITOT 0.8 08/12/2014 1330      Lab Results  Component Value Date   IRON 25* 09/24/2013   TIBC 325 09/24/2013   FERRITIN 113 08/25/2014     ASSESSMENT AND PLAN:  Chronic anemia Hemoccult positive Stool History of PUD, ulcer at the ileocecal valve History of Gastric AVM CVA's Autoimmune Hepatitis Thrombocytopenia Rheumatoid arthritis Stage III CKD History of BMBX in 2012 that was "nonspecific"  THERAPY PLAN:   We will continue to monitor her counts closely. I advised the patient that her anemia is from multiple causes including GI related blood loss, kidney disease, iron deficiency. She has not had a bone marrow biopsy in some time and if her counts continue to worsen I feel that that may be needed. Currently however, she is overwhelmed by her health issues.  She will continue with blood counts every 2 weeks. We may need to increase the frequency of this depending on her counts moving forward.  I have increased her Aranesp to 80 g every 2 weeks. Certainly appreciate Dr. Olevia Perches help as well. In regards to her  persistent elevation in her SGOT, I cannot relate it to any specific with blood cell disorder.  I discussed with the patient today that her abdominal exam is unremarkable. Since her pain is movement induced I suspect it is musculoskeletal. I have encouraged her to follow-up with her primary doctor.  We discussed another activity she could do for enjoyment. She is open to consider some of these.  All questions were answered. The patient knows to call the clinic with any problems, questions or concerns. We can certainly see the patient much sooner if necessary.   This document serves as a record of services personally performed by Ancil Linsey, MD. It was created on her behalf by Janace Hoard, a trained medical scribe. The creation of this record is based on the scribe's personal observations and the provider's statements to them. This document has been checked and approved by the attending provider.  I have reviewed the above documentation for accuracy and completeness, and I agree with the  above.  This note is electronically signed.  Kelby Fam. Whitney Muse, MD

## 2014-09-22 NOTE — Patient Instructions (Signed)
..  [facility] at [facility] Discharge Instructions  RECOMMENDATIONS MADE BY THE CONSULTANT AND ANY TEST RESULTS WILL BE SENT TO YOUR REFERRING PHYSICIAN.  Exam in 2 months Labs and injection every 2 weeks  Thank you for choosing [facility] at [facility] to provide your oncology and hematology care.  To afford each patient quality time with our Ligaya Cormier, please arrive at least 15 minutes before your scheduled appointment time.    You need to re-schedule your appointment should you arrive 10 or more minutes late.  We strive to give you quality time with our providers, and arriving late affects you and other patients whose appointments are after yours.  Also, if you no show three or more times for appointments you may be dismissed from the clinic at the providers discretion.     Again, thank you for choosing [facility].  Our hope is that these requests will decrease the amount of time that you wait before being seen by our physicians.       _____________________________________________________________  Should you have questions after your visit to [facility], please contact our office at [phone] between the hours of 8:30 a.m. and 4:30 p.m.  Voicemails left after 4:30 p.m. will not be returned until the following business day.  For prescription refill requests, have your pharmacy contact our office.

## 2014-09-22 NOTE — Progress Notes (Signed)
Please see doctors encounter for more information 

## 2014-09-22 NOTE — Progress Notes (Signed)
Labs drawn

## 2014-09-23 MED ORDER — CYANOCOBALAMIN 1000 MCG/ML IJ SOLN
INTRAMUSCULAR | Status: AC
  Filled 2014-09-23: qty 1

## 2014-09-26 ENCOUNTER — Encounter (HOSPITAL_COMMUNITY): Payer: Medicare Other | Attending: Hematology & Oncology

## 2014-09-26 VITALS — BP 110/57 | HR 71 | Temp 98.0°F | Resp 16

## 2014-09-26 DIAGNOSIS — N183 Chronic kidney disease, stage 3 unspecified: Secondary | ICD-10-CM

## 2014-09-26 DIAGNOSIS — D500 Iron deficiency anemia secondary to blood loss (chronic): Secondary | ICD-10-CM

## 2014-09-26 DIAGNOSIS — D631 Anemia in chronic kidney disease: Secondary | ICD-10-CM

## 2014-09-26 MED ORDER — SODIUM CHLORIDE 0.9 % IV SOLN
510.0000 mg | Freq: Once | INTRAVENOUS | Status: AC
  Administered 2014-09-26: 510 mg via INTRAVENOUS
  Filled 2014-09-26: qty 17

## 2014-09-26 MED ORDER — SODIUM CHLORIDE 0.9 % IV SOLN
250.0000 mL | Freq: Once | INTRAVENOUS | Status: AC
  Administered 2014-09-26: 250 mL via INTRAVENOUS

## 2014-09-26 NOTE — Patient Instructions (Signed)
[facility] at [facility] Discharge Instructions  RECOMMENDATIONS MADE BY THE CONSULTANT AND ANY TEST RESULTS WILL BE SENT TO YOUR REFERRING PHYSICIAN.  You received IV Iron today.    Thank you for choosing [facility] at [facility] to provide your oncology and hematology care.  To afford each patient quality time with our provider, please arrive at least 15 minutes before your scheduled appointment time.    You need to re-schedule your appointment should you arrive 10 or more minutes late.  We strive to give you quality time with our providers, and arriving late affects you and other patients whose appointments are after yours.  Also, if you no show three or more times for appointments you may be dismissed from the clinic at the providers discretion.     Again, thank you for choosing [facility].  Our hope is that these requests will decrease the amount of time that you wait before being seen by our physicians.       _____________________________________________________________  Should you have questions after your visit to [facility], please contact our office at [phone] between the hours of 8:30 a.m. and 4:30 p.m.  Voicemails left after 4:30 p.m. will not be returned until the following business day.  For prescription refill requests, have your pharmacy contact our office.     

## 2014-09-30 ENCOUNTER — Other Ambulatory Visit (HOSPITAL_COMMUNITY): Payer: Self-pay

## 2014-10-06 ENCOUNTER — Encounter (HOSPITAL_COMMUNITY): Payer: Medicare Other | Attending: Hematology & Oncology

## 2014-10-06 ENCOUNTER — Encounter (HOSPITAL_COMMUNITY): Payer: Medicare Other | Attending: Oncology

## 2014-10-06 ENCOUNTER — Encounter (HOSPITAL_COMMUNITY): Payer: Self-pay

## 2014-10-06 VITALS — BP 120/55 | HR 92 | Temp 97.9°F | Resp 16

## 2014-10-06 DIAGNOSIS — N289 Disorder of kidney and ureter, unspecified: Secondary | ICD-10-CM | POA: Insufficient documentation

## 2014-10-06 DIAGNOSIS — D631 Anemia in chronic kidney disease: Secondary | ICD-10-CM

## 2014-10-06 DIAGNOSIS — D500 Iron deficiency anemia secondary to blood loss (chronic): Secondary | ICD-10-CM | POA: Insufficient documentation

## 2014-10-06 DIAGNOSIS — D509 Iron deficiency anemia, unspecified: Secondary | ICD-10-CM | POA: Insufficient documentation

## 2014-10-06 DIAGNOSIS — D649 Anemia, unspecified: Secondary | ICD-10-CM | POA: Diagnosis present

## 2014-10-06 DIAGNOSIS — N183 Chronic kidney disease, stage 3 unspecified: Secondary | ICD-10-CM

## 2014-10-06 DIAGNOSIS — D638 Anemia in other chronic diseases classified elsewhere: Secondary | ICD-10-CM

## 2014-10-06 DIAGNOSIS — D61818 Other pancytopenia: Secondary | ICD-10-CM | POA: Insufficient documentation

## 2014-10-06 LAB — CBC WITH DIFFERENTIAL/PLATELET
BASOS ABS: 0 10*3/uL (ref 0.0–0.1)
BASOS PCT: 1 %
EOS ABS: 0.2 10*3/uL (ref 0.0–0.7)
EOS PCT: 4 %
HCT: 31.3 % — ABNORMAL LOW (ref 36.0–46.0)
HEMOGLOBIN: 9.9 g/dL — AB (ref 12.0–15.0)
LYMPHS ABS: 0.6 10*3/uL — AB (ref 0.7–4.0)
Lymphocytes Relative: 16 %
MCH: 26.5 pg (ref 26.0–34.0)
MCHC: 31.6 g/dL (ref 30.0–36.0)
MCV: 83.7 fL (ref 78.0–100.0)
Monocytes Absolute: 0.6 10*3/uL (ref 0.1–1.0)
Monocytes Relative: 16 %
NEUTROS PCT: 64 %
Neutro Abs: 2.4 10*3/uL (ref 1.7–7.7)
PLATELETS: 71 10*3/uL — AB (ref 150–400)
RBC: 3.74 MIL/uL — AB (ref 3.87–5.11)
RDW: 26.6 % — ABNORMAL HIGH (ref 11.5–15.5)
WBC: 3.8 10*3/uL — AB (ref 4.0–10.5)

## 2014-10-06 MED ORDER — DARBEPOETIN ALFA 100 MCG/0.5ML IJ SOSY
80.0000 ug | PREFILLED_SYRINGE | Freq: Once | INTRAMUSCULAR | Status: AC
  Administered 2014-10-06: 80 ug via SUBCUTANEOUS
  Filled 2014-10-06: qty 0.5

## 2014-10-06 NOTE — Patient Instructions (Signed)
[facility] at [facility] Discharge Instructions  RECOMMENDATIONS MADE BY THE CONSULTANT AND ANY TEST RESULTS WILL BE SENT TO YOUR REFERRING PHYSICIAN.  Aranesp injection today. Return as scheduled for lab work and injections.  Thank you for choosing [facility] at [facility] to provide your oncology and hematology care.  To afford each patient quality time with our provider, please arrive at least 15 minutes before your scheduled appointment time.    You need to re-schedule your appointment should you arrive 10 or more minutes late.  We strive to give you quality time with our providers, and arriving late affects you and other patients whose appointments are after yours.  Also, if you no show three or more times for appointments you may be dismissed from the clinic at the providers discretion.     Again, thank you for choosing [facility].  Our hope is that these requests will decrease the amount of time that you wait before being seen by our physicians.       _____________________________________________________________  Should you have questions after your visit to [facility], please contact our office at [phone] between the hours of 8:30 a.m. and 4:30 p.m.  Voicemails left after 4:30 p.m. will not be returned until the following business day.  For prescription refill requests, have your pharmacy contact our office.    

## 2014-10-06 NOTE — Progress Notes (Signed)
Eileen Santana presents today for injection per the provider's orders.  Aranesp administration without incident; see MAR for injection details.  Patient tolerated procedure well and without incident.  No questions or complaints noted at this time.

## 2014-10-06 NOTE — Progress Notes (Signed)
Lab draw

## 2014-10-07 ENCOUNTER — Encounter (INDEPENDENT_AMBULATORY_CARE_PROVIDER_SITE_OTHER): Payer: Self-pay | Admitting: Internal Medicine

## 2014-10-07 ENCOUNTER — Ambulatory Visit (INDEPENDENT_AMBULATORY_CARE_PROVIDER_SITE_OTHER): Payer: Medicare Other | Admitting: Internal Medicine

## 2014-10-07 VITALS — BP 110/64 | HR 66 | Temp 97.6°F | Resp 18 | Ht 66.0 in | Wt 156.6 lb

## 2014-10-07 DIAGNOSIS — K219 Gastro-esophageal reflux disease without esophagitis: Secondary | ICD-10-CM | POA: Diagnosis not present

## 2014-10-07 DIAGNOSIS — K754 Autoimmune hepatitis: Secondary | ICD-10-CM

## 2014-10-07 DIAGNOSIS — I639 Cerebral infarction, unspecified: Secondary | ICD-10-CM | POA: Diagnosis not present

## 2014-10-07 DIAGNOSIS — D509 Iron deficiency anemia, unspecified: Secondary | ICD-10-CM | POA: Diagnosis not present

## 2014-10-07 NOTE — Progress Notes (Signed)
Presenting complaint;  Follow-up for autoimmune hepatitis, GERD and iron deficiency anemia.  Database;  Patient is 81 year old Caucasian female who is here for scheduled visit. She was last seen in the office on 06/23/2014. She received 2 units of PRBCs on 06/26/2014. She underwent EGD and colonoscopy on 07/04/2014. EGD revealed mild Candida esophagitis and a small sliding hiatal hernia. She also had portal gastropathy without evidence of bleeding and she did not have varices. She'll return for small bowel given capsule study on 07/14/2014. 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 It was felt she may have bled from colonic diverticulosis. Her hemoglobin dropped to 8 g about 4 weeks ago and she received 2 units of PRBCs on 09/13/2014. She remains on Aranesp as every 2 weeks.  Subjective:  She denies abdominal pain nausea vomiting melena or rectal bleeding. Bowels move daily. She states she has good appetite. She says heartburns well controlled with PPI every other day. Lately she has noted lower extremity edema and she is taking fluid pill every other day. She denies shortness of breath. She is had more back pain since dose was reduced because of drowsiness. She takes Tylenol every now and then but denies taking OTC NSAIDs. She remains on low-dose aspirin.   Current Medications: Outpatient Encounter Prescriptions as of 10/07/2014  Medication Sig  . acetaminophen (TYLENOL) 325 MG tablet Take 650 mg by mouth every 6 (six) hours as needed. For pain  . aspirin 81 MG chewable tablet Chew 2 tablets (162 mg total) by mouth daily.  Marland Kitchen azaTHIOprine (IMURAN) 50 MG tablet TAKE 1 AND 1/2 TABLETS BY MOUTH ONCE DAILY  . Cholecalciferol (VITAMIN D) 2000 UNITS tablet Take 2,000 Units by mouth daily.  . Coenzyme Q10 200 MG capsule Take 200 mg by mouth daily.  . Darbepoetin Alfa (ARANESP) 500 MCG/ML SOSY injection Inject 500 mcg into the  skin every 21 ( twenty-one) days.  Marland Kitchen DEXILANT 60 MG capsule TAKE 1 CAPSULE BY MOUTH EVERY OTHER DAY  . diltiazem (CARDIZEM CD) 360 MG 24 hr capsule   . diltiazem (TIAZAC) 360 MG 24 hr capsule Take 1 capsule (360 mg total) by mouth daily.  . fish oil-omega-3 fatty acids 1000 MG capsule Take 1 g by mouth daily.  . fluticasone (FLONASE) 50 MCG/ACT nasal spray Place 2 sprays into both nostrils 2 (two) times daily.  . folic acid (FOLVITE) 800 MCG tablet Take 800 mcg by mouth daily.   . furosemide (LASIX) 20 MG tablet Take 20 mg by mouth daily as needed. For fluid retention  . Hydrocodone-Acetaminophen 2.5-325 MG TABS Take 1 tablet by mouth 3 (three) times daily as needed.  . lidocaine-prilocaine (EMLA) cream Apply 1 application topically as needed.   . Magnesium 400 MG TABS Take 1 tablet by mouth 2 (two) times daily.  . metoprolol (LOPRESSOR) 50 MG tablet Take 1 tablet 2 times daily on Saturday and Sunday and resume one and a half tablets 2 times daily on Monday 04/06/14. (Patient taking differently: Take 50 mg by mouth 2 (two) times daily. )  . Multiple Vitamins-Minerals (MULTIVITAMIN WITH MINERALS) tablet Take 1 tablet by mouth daily.    Marland Kitchen nystatin (MYCOSTATIN) 100000 UNIT/ML suspension TAKE BY MOUTH FOUR TIMES DAILY.  Marland Kitchen potassium chloride SA (K-DUR,KLOR-CON) 20 MEQ tablet TAKE 1 TABLET BY MOUTH ONCE DAILY (Patient taking differently: Patient takes Monday, Wednesday, Friday)  . predniSONE (DELTASONE) 5 MG tablet TAKE 11/2 TABLETS BY MOUTH DAILY  .  spironolactone (ALDACTONE) 50 MG tablet TAKE 1 TABLET BY MOUTH DAILY  . Travoprost, BAK Free, (TRAVATAN) 0.004 % SOLN ophthalmic solution Place 1 drop into both eyes at bedtime.  . ursodiol (ACTIGALL) 300 MG capsule Take 300 mg by mouth 2 (two) times daily.   . vitamin B-12 (CYANOCOBALAMIN) 1000 MCG tablet Take 500 mcg by mouth daily.    Facility-Administered Encounter Medications as of 10/07/2014  Medication  . 0.9 %  sodium chloride infusion  .  diphenhydrAMINE (BENADRYL) capsule 25 mg     Objective: Blood pressure 110/64, pulse 66, temperature 97.6 F (36.4 C), temperature source Oral, resp. rate 18, height 5\' 6"  (1.676 m), weight 156 lb 9.6 oz (71.033 kg). Patient is alert and in no acute distress. Asterixis absent. She has round facies. Conjunctiva is pink. Sclera is nonicteric Oropharyngeal mucosa is normal. No neck masses or thyromegaly noted. Cardiac exam with regular rhythm normal S1 and S2. She has grade 2/6 systolic murmur best heard at left upper sternal border. Lungs are clear to auscultation. Abdomen is full but soft and nontender without organomegaly or masses. 1-2+ pitting edema noted around both ankles.  Labs/studies Results:   Recent Labs  10/06/14 1015  WBC 3.8*  HGB 9.9*  HCT 31.3*  PLT 71*   LFTs from 08/12/2014 Bilirubin 0.8, AP 55, AST 184, ALT 13, total protein 6.0 and albumen 2.7.    Assessment:  #1. Autoimmune hepatitis. He remains on azathioprine and low-dose prednisone. ALT has remained within normal range for several months but AST is not. Persistently elevated AST may be from a muscle source since Dr. 10/12/2014 feels she does not have evidence of hemolytic anemia. #2. Iron deficiency anemia. Hemoglobin yesterday was 9.9. No evidence of overt GI bleed. She possibly has been losing blood from small bowel AV malformations.She may also have an element of chronic disease anemia  #3. GERD. Heartburn is well controlled with every other day PPI and anti-reflux measures.  #4. Lower extremity edema. Last year she had significant edema when serum albumin was slow. It remains to be seen if edema is secondary to worsening hypoalbuminemia or she has venous incompetence. #5. History of hypomagnesemia. Patient remains on by mouth magnesium. Level needs to be checked.  Plan:  Hemoccult 1. Patient will have serum magnesium, metabolic 7 and LFTs with her next blood draw in 2 weeks. She will continue to  use furosemide on as-needed basis. Office visit in 3 months.

## 2014-10-07 NOTE — Patient Instructions (Signed)
Serum magnesium LFTs and metabolic 7 to be done within next blood work and 10/20/2014. Hemoccult 1

## 2014-10-10 ENCOUNTER — Telehealth (INDEPENDENT_AMBULATORY_CARE_PROVIDER_SITE_OTHER): Payer: Self-pay | Admitting: *Deleted

## 2014-10-10 NOTE — Telephone Encounter (Signed)
   Diagnosis:    Result(s)   Card 1: Positive          Completed by:    HEMOCCULT SENSA DEVELOPER: LOT#:  [id number] EXPIRATION DATE: 9-17   HEMOCCULT SENSA CARD:  LOT#[id number]:   EXPIRATION DATE: 07/18   CARD CONTROL RESULTS:  POSITIVE: Positive NEGATIVE: negative    ADDITIONAL COMMENTS: Patient was called and made aware.

## 2014-10-12 NOTE — Telephone Encounter (Signed)
Stool is guaiac positive. Continue to monitor H&H

## 2014-10-20 ENCOUNTER — Other Ambulatory Visit (HOSPITAL_COMMUNITY)
Admission: RE | Admit: 2014-10-20 | Discharge: 2014-10-20 | Disposition: A | Discharge status: Home / Self Care | Payer: Medicare Other | Source: Ambulatory Visit | Attending: Internal Medicine | Admitting: Internal Medicine

## 2014-10-20 ENCOUNTER — Encounter (HOSPITAL_COMMUNITY): Payer: Medicare Other | Attending: Hematology & Oncology

## 2014-10-20 ENCOUNTER — Encounter (HOSPITAL_BASED_OUTPATIENT_CLINIC_OR_DEPARTMENT_OTHER): Payer: Medicare Other

## 2014-10-20 ENCOUNTER — Other Ambulatory Visit (INDEPENDENT_AMBULATORY_CARE_PROVIDER_SITE_OTHER): Payer: Self-pay | Admitting: Internal Medicine

## 2014-10-20 VITALS — BP 103/63 | HR 68 | Temp 97.9°F | Resp 16

## 2014-10-20 DIAGNOSIS — N183 Chronic kidney disease, stage 3 unspecified: Secondary | ICD-10-CM

## 2014-10-20 DIAGNOSIS — D500 Iron deficiency anemia secondary to blood loss (chronic): Secondary | ICD-10-CM | POA: Diagnosis not present

## 2014-10-20 DIAGNOSIS — D631 Anemia in chronic kidney disease: Secondary | ICD-10-CM

## 2014-10-20 DIAGNOSIS — D638 Anemia in other chronic diseases classified elsewhere: Secondary | ICD-10-CM

## 2014-10-20 DIAGNOSIS — K754 Autoimmune hepatitis: Secondary | ICD-10-CM | POA: Insufficient documentation

## 2014-10-20 DIAGNOSIS — Z23 Encounter for immunization: Secondary | ICD-10-CM

## 2014-10-20 LAB — CBC WITH DIFFERENTIAL/PLATELET
BASOS PCT: 1 %
Basophils Absolute: 0 10*3/uL (ref 0.0–0.1)
Eosinophils Absolute: 0.1 10*3/uL (ref 0.0–0.7)
Eosinophils Relative: 5 %
HEMATOCRIT: 27.4 % — AB (ref 36.0–46.0)
HEMOGLOBIN: 8.9 g/dL — AB (ref 12.0–15.0)
LYMPHS PCT: 21 %
Lymphs Abs: 0.6 10*3/uL — ABNORMAL LOW (ref 0.7–4.0)
MCH: 27.3 pg (ref 26.0–34.0)
MCHC: 32.5 g/dL (ref 30.0–36.0)
MCV: 84 fL (ref 78.0–100.0)
MONO ABS: 0.4 10*3/uL (ref 0.1–1.0)
MONOS PCT: 16 %
NEUTROS ABS: 1.5 10*3/uL — AB (ref 1.7–7.7)
Neutrophils Relative %: 57 %
Platelets: 86 10*3/uL — ABNORMAL LOW (ref 150–400)
RBC: 3.26 MIL/uL — ABNORMAL LOW (ref 3.87–5.11)
RDW: 22.9 % — ABNORMAL HIGH (ref 11.5–15.5)
Smear Review: DECREASED
WBC: 2.6 10*3/uL — ABNORMAL LOW (ref 4.0–10.5)

## 2014-10-20 LAB — HEPATIC FUNCTION PANEL
ALT: 15 U/L (ref 14–54)
AST: 340 U/L — AB (ref 15–41)
Albumin: 2.6 g/dL — ABNORMAL LOW (ref 3.5–5.0)
Alkaline Phosphatase: 50 U/L (ref 38–126)
BILIRUBIN DIRECT: 0.4 mg/dL (ref 0.1–0.5)
BILIRUBIN TOTAL: 1 mg/dL (ref 0.3–1.2)
Indirect Bilirubin: 0.6 mg/dL (ref 0.3–0.9)
Total Protein: 6 g/dL — ABNORMAL LOW (ref 6.5–8.1)

## 2014-10-20 LAB — BASIC METABOLIC PANEL
ANION GAP: 7 (ref 5–15)
BUN: 26 mg/dL — ABNORMAL HIGH (ref 6–20)
CALCIUM: 9.7 mg/dL (ref 8.9–10.3)
CO2: 28 mmol/L (ref 22–32)
Chloride: 100 mmol/L — ABNORMAL LOW (ref 101–111)
Creatinine, Ser: 1.34 mg/dL — ABNORMAL HIGH (ref 0.44–1.00)
GFR calc Af Amer: 42 mL/min — ABNORMAL LOW (ref >60)
GFR calc non Af Amer: 36 mL/min — ABNORMAL LOW (ref >60)
GLUCOSE: 167 mg/dL — AB (ref 65–99)
POTASSIUM: 3.7 mmol/L (ref 3.5–5.1)
Sodium: 135 mmol/L (ref 135–145)

## 2014-10-20 LAB — MAGNESIUM: MAGNESIUM: 1.6 mg/dL — AB (ref 1.7–2.4)

## 2014-10-20 LAB — FERRITIN: FERRITIN: 92 ng/mL (ref 11–307)

## 2014-10-20 MED ORDER — DARBEPOETIN ALFA 100 MCG/0.5ML IJ SOSY
80.0000 ug | PREFILLED_SYRINGE | Freq: Once | INTRAMUSCULAR | Status: AC
  Administered 2014-10-20: 80 ug via SUBCUTANEOUS

## 2014-10-20 MED ORDER — INFLUENZA VAC SPLIT QUAD 0.5 ML IM SUSY
0.5000 mL | PREFILLED_SYRINGE | INTRAMUSCULAR | Status: AC
  Administered 2014-10-20: 0.5 mL via INTRAMUSCULAR

## 2014-10-20 MED ORDER — DARBEPOETIN ALFA 100 MCG/0.5ML IJ SOSY
PREFILLED_SYRINGE | INTRAMUSCULAR | Status: AC
  Filled 2014-10-20: qty 0.5

## 2014-10-20 MED ORDER — INFLUENZA VAC SPLIT QUAD 0.5 ML IM SUSY
PREFILLED_SYRINGE | INTRAMUSCULAR | Status: AC
  Filled 2014-10-20: qty 0.5

## 2014-10-20 NOTE — Patient Instructions (Signed)
[facility] at [facility] Discharge Instructions  RECOMMENDATIONS MADE BY THE CONSULTANT AND ANY TEST RESULTS WILL BE SENT TO YOUR REFERRING PHYSICIAN.  Hemoglobin 8.9 today. Aranesp 80 mcg injection given as ordered. Return as scheduled.  Thank you for choosing [facility] at [facility] to provide your oncology and hematology care.  To afford each patient quality time with our provider, please arrive at least 15 minutes before your scheduled appointment time.    You need to re-schedule your appointment should you arrive 10 or more minutes late.  We strive to give you quality time with our providers, and arriving late affects you and other patients whose appointments are after yours.  Also, if you no show three or more times for appointments you may be dismissed from the clinic at the providers discretion.     Again, thank you for choosing [facility].  Our hope is that these requests will decrease the amount of time that you wait before being seen by our physicians.       _____________________________________________________________  Should you have questions after your visit to [facility], please contact our office at [phone] between the hours of 8:30 a.m. and 4:30 p.m.  Voicemails left after 4:30 p.m. will not be returned until the following business day.  For prescription refill requests, have your pharmacy contact our office.

## 2014-10-20 NOTE — Progress Notes (Signed)
Eileen Santana presents today for injection per MD orders. Aranesp 80 mcg administered SQ in left Abdomen. Administration without incident. Patient tolerated well.  Eileen Santana presents today for injection per MD orders. Fluarix administered IM in left Upper Arm. Administration without incident. Patient tolerated well.

## 2014-10-20 NOTE — Progress Notes (Signed)
Labs drawn

## 2014-10-21 ENCOUNTER — Other Ambulatory Visit (HOSPITAL_COMMUNITY): Payer: Self-pay | Admitting: Oncology

## 2014-10-21 ENCOUNTER — Telehealth (INDEPENDENT_AMBULATORY_CARE_PROVIDER_SITE_OTHER): Payer: Self-pay | Admitting: *Deleted

## 2014-10-21 DIAGNOSIS — D500 Iron deficiency anemia secondary to blood loss (chronic): Secondary | ICD-10-CM

## 2014-10-21 DIAGNOSIS — K754 Autoimmune hepatitis: Secondary | ICD-10-CM

## 2014-10-21 DIAGNOSIS — R748 Abnormal levels of other serum enzymes: Secondary | ICD-10-CM

## 2014-10-21 DIAGNOSIS — D638 Anemia in other chronic diseases classified elsewhere: Secondary | ICD-10-CM

## 2014-10-21 DIAGNOSIS — D509 Iron deficiency anemia, unspecified: Secondary | ICD-10-CM

## 2014-10-21 DIAGNOSIS — K921 Melena: Secondary | ICD-10-CM

## 2014-10-21 MED ORDER — SODIUM CHLORIDE 0.9 % IV SOLN
125.0000 mg | Freq: Once | INTRAVENOUS | Status: DC
  Filled 2014-10-21: qty 10

## 2014-10-21 NOTE — Telephone Encounter (Signed)
Per Dr.Rehman.

## 2014-10-23 LAB — CK: Total CK: 21 U/L (ref 7–177)

## 2014-10-23 LAB — LACTATE DEHYDROGENASE: LDH: 186 U/L (ref 94–250)

## 2014-10-24 ENCOUNTER — Other Ambulatory Visit (INDEPENDENT_AMBULATORY_CARE_PROVIDER_SITE_OTHER): Payer: Self-pay | Admitting: *Deleted

## 2014-10-24 ENCOUNTER — Telehealth (INDEPENDENT_AMBULATORY_CARE_PROVIDER_SITE_OTHER): Payer: Self-pay | Admitting: *Deleted

## 2014-10-24 LAB — SEDIMENTATION RATE: Sed Rate: 45 mm/hr — ABNORMAL HIGH (ref 0–30)

## 2014-10-24 MED ORDER — URSODIOL 300 MG PO CAPS: 300.0000 mg | ORAL_CAPSULE | Freq: Two times a day (BID) | ORAL | Status: DC

## 2014-10-24 NOTE — Telephone Encounter (Signed)
May refill per Dr.Rehman.

## 2014-10-24 NOTE — Telephone Encounter (Signed)
Eileen Santana is needing a new Rx for Predinsone 10 mg faxed to [facility]. Dr. Karilyn Cota had increased this medicine. She is also needing Rx for Ursodiol sent to [facility].

## 2014-10-24 NOTE — Telephone Encounter (Signed)
The patient's Eileen Santana has been sent to [facility]. Awaiting to hear from Dr.Rehman about the patient's increase in Prednisone, request from [facility].

## 2014-10-25 LAB — ALDOLASE: ALDOLASE: 5.9 U/L (ref <=8.1)

## 2014-10-27 ENCOUNTER — Encounter (HOSPITAL_BASED_OUTPATIENT_CLINIC_OR_DEPARTMENT_OTHER): Payer: Medicare Other

## 2014-10-27 VITALS — BP 99/41 | HR 66 | Temp 98.1°F | Resp 16

## 2014-10-27 DIAGNOSIS — D500 Iron deficiency anemia secondary to blood loss (chronic): Secondary | ICD-10-CM

## 2014-10-27 MED ORDER — SODIUM CHLORIDE 0.9 % IV SOLN
510.0000 mg | Freq: Once | INTRAVENOUS | Status: AC
  Administered 2014-10-27: 510 mg via INTRAVENOUS
  Filled 2014-10-27: qty 17

## 2014-10-27 MED ORDER — SODIUM CHLORIDE 0.9 % IV SOLN
INTRAVENOUS | Status: DC
  Administered 2014-10-27: 13:00:00 via INTRAVENOUS

## 2014-10-27 NOTE — Patient Instructions (Signed)
[facility] at [facility] Discharge Instructions  RECOMMENDATIONS MADE BY THE CONSULTANT AND ANY TEST RESULTS WILL BE SENT TO YOUR REFERRING PHYSICIAN.  Feraheme IV today.    Thank you for choosing [facility] at [facility] to provide your oncology and hematology care.  To afford each patient quality time with our provider, please arrive at least 15 minutes before your scheduled appointment time.    You need to re-schedule your appointment should you arrive 10 or more minutes late.  We strive to give you quality time with our providers, and arriving late affects you and other patients whose appointments are after yours.  Also, if you no show three or more times for appointments you may be dismissed from the clinic at the providers discretion.     Again, thank you for choosing [facility].  Our hope is that these requests will decrease the amount of time that you wait before being seen by our physicians.       _____________________________________________________________  Should you have questions after your visit to [facility], please contact our office at [phone] between the hours of 8:30 a.m. and 4:30 p.m.  Voicemails left after 4:30 p.m. will not be returned until the following business day.  For prescription refill requests, have your pharmacy contact our office.

## 2014-10-28 ENCOUNTER — Telehealth (INDEPENDENT_AMBULATORY_CARE_PROVIDER_SITE_OTHER): Payer: Self-pay | Admitting: *Deleted

## 2014-10-28 DIAGNOSIS — K51918 Ulcerative colitis, unspecified with other complication: Secondary | ICD-10-CM

## 2014-10-28 NOTE — Telephone Encounter (Signed)
Per Dr.Rehman the patient will need to have labs drawn the second week in November.

## 2014-10-29 ENCOUNTER — Emergency Department (HOSPITAL_COMMUNITY)
Admission: EM | Admit: 2014-10-29 | Discharge: 2014-10-29 | Disposition: A | Discharge status: Home / Self Care | Payer: Medicare Other | Attending: Emergency Medicine | Admitting: Emergency Medicine

## 2014-10-29 ENCOUNTER — Encounter (HOSPITAL_COMMUNITY): Payer: Self-pay | Admitting: Emergency Medicine

## 2014-10-29 DIAGNOSIS — Z7982 Long term (current) use of aspirin: Secondary | ICD-10-CM | POA: Insufficient documentation

## 2014-10-29 DIAGNOSIS — Z8619 Personal history of other infectious and parasitic diseases: Secondary | ICD-10-CM | POA: Diagnosis not present

## 2014-10-29 DIAGNOSIS — Z8673 Personal history of transient ischemic attack (TIA), and cerebral infarction without residual deficits: Secondary | ICD-10-CM | POA: Diagnosis not present

## 2014-10-29 DIAGNOSIS — D631 Anemia in chronic kidney disease: Secondary | ICD-10-CM | POA: Insufficient documentation

## 2014-10-29 DIAGNOSIS — I480 Paroxysmal atrial fibrillation: Secondary | ICD-10-CM | POA: Insufficient documentation

## 2014-10-29 DIAGNOSIS — Z8669 Personal history of other diseases of the nervous system and sense organs: Secondary | ICD-10-CM | POA: Insufficient documentation

## 2014-10-29 DIAGNOSIS — Z7951 Long term (current) use of inhaled steroids: Secondary | ICD-10-CM | POA: Diagnosis not present

## 2014-10-29 DIAGNOSIS — Z87442 Personal history of urinary calculi: Secondary | ICD-10-CM | POA: Insufficient documentation

## 2014-10-29 DIAGNOSIS — Z7952 Long term (current) use of systemic steroids: Secondary | ICD-10-CM | POA: Insufficient documentation

## 2014-10-29 DIAGNOSIS — Z87440 Personal history of urinary (tract) infections: Secondary | ICD-10-CM | POA: Insufficient documentation

## 2014-10-29 DIAGNOSIS — I4892 Unspecified atrial flutter: Secondary | ICD-10-CM | POA: Insufficient documentation

## 2014-10-29 DIAGNOSIS — Z79899 Other long term (current) drug therapy: Secondary | ICD-10-CM | POA: Insufficient documentation

## 2014-10-29 DIAGNOSIS — N183 Chronic kidney disease, stage 3 (moderate): Secondary | ICD-10-CM | POA: Insufficient documentation

## 2014-10-29 DIAGNOSIS — Z720 Tobacco use: Secondary | ICD-10-CM | POA: Insufficient documentation

## 2014-10-29 DIAGNOSIS — M5432 Sciatica, left side: Secondary | ICD-10-CM | POA: Diagnosis not present

## 2014-10-29 DIAGNOSIS — M069 Rheumatoid arthritis, unspecified: Secondary | ICD-10-CM | POA: Diagnosis not present

## 2014-10-29 DIAGNOSIS — Z8709 Personal history of other diseases of the respiratory system: Secondary | ICD-10-CM | POA: Insufficient documentation

## 2014-10-29 DIAGNOSIS — Z8719 Personal history of other diseases of the digestive system: Secondary | ICD-10-CM | POA: Insufficient documentation

## 2014-10-29 DIAGNOSIS — Q2733 Arteriovenous malformation of digestive system vessel: Secondary | ICD-10-CM | POA: Insufficient documentation

## 2014-10-29 DIAGNOSIS — Z8701 Personal history of pneumonia (recurrent): Secondary | ICD-10-CM | POA: Diagnosis not present

## 2014-10-29 DIAGNOSIS — I129 Hypertensive chronic kidney disease with stage 1 through stage 4 chronic kidney disease, or unspecified chronic kidney disease: Secondary | ICD-10-CM | POA: Insufficient documentation

## 2014-10-29 DIAGNOSIS — M545 Low back pain: Secondary | ICD-10-CM | POA: Diagnosis present

## 2014-10-29 MED ORDER — KETOROLAC TROMETHAMINE 30 MG/ML IJ SOLN
15.0000 mg | Freq: Once | INTRAMUSCULAR | Status: AC
  Administered 2014-10-29: 15 mg via INTRAMUSCULAR
  Filled 2014-10-29: qty 1

## 2014-10-29 MED ORDER — OXYCODONE-ACETAMINOPHEN 5-325 MG PO TABS: 1.0000 | ORAL_TABLET | Freq: Four times a day (QID) | ORAL | Status: DC | PRN

## 2014-10-29 MED ORDER — HYDROMORPHONE HCL 1 MG/ML IJ SOLN: 1.0000 mg | Freq: Once | INTRAMUSCULAR | Status: DC

## 2014-10-29 MED ORDER — HYDROMORPHONE HCL 1 MG/ML IJ SOLN
0.5000 mg | Freq: Once | INTRAMUSCULAR | Status: AC
  Administered 2014-10-29: 0.5 mg via INTRAMUSCULAR
  Filled 2014-10-29: qty 1

## 2014-10-29 MED ORDER — DEXAMETHASONE 4 MG PO TABS
8.0000 mg | ORAL_TABLET | Freq: Once | ORAL | Status: AC
  Administered 2014-10-29: 8 mg via ORAL
  Filled 2014-10-29: qty 2

## 2014-10-29 NOTE — ED Provider Notes (Signed)
CSN: [id number]     Arrival date & time 10/29/14  1107 History  By signing my name below, I, Freida Busman, attest that this documentation has been prepared under the direction and in the presence of Raeford Razor, MD . Electronically Signed: Freida Busman, Scribe. 10/29/2014. 11:45 AM.  Chief Complaint  Patient presents with  . Hip Pain   The history is provided by the patient. No language interpreter was used.   HPI Comments:  Eileen Santana is a 81 y.o. female with a history of arthritis, who presents to the Emergency Department complaining of aching left lower back pain that started 3 days ago and  worsened this AM.  Pt notes her pain radiates into buttocks and down her LLE. Her pain is worse with movement and ambulation. She notes her pain is improved when still. She has no other complaints at this time. No alleviating factors noted.  Past Medical History  Diagnosis Date  . History of GI bleed     Associated with NSAIDS  . Iron deficiency anemia     Transfusion dependent  . DJD (degenerative joint disease)   . Essential hypertension, benign   . History of colitis   . Ileitis   . Pancytopenia   . Autoimmune hepatitis (HCC)     With leukocytoclastic vasculitis  . Heart block AV second degree March 2013    a. Cardiology consult note 06/2013: "Question of previous second degree heart block, although review of cardiology notes indicates that this may have been actually blocked PACs when the patient was on verapamil."  . Bronchitis   . Steroid-induced myopathy   . Renal calculus 08/12/2011  . Rheumatoid arthritis(714.0)   . Colon ulcer 04/2010    NSAID related  . Gastric ulcer 04/2010    NSAID related  . UGI bleed 09/20/2011    Focal area of gastritis oozing of blood  . Gastric AVM 09/20/2011  . Candida esophagitis (HCC) 09/20/2011  . Paroxysmal atrial fibrillation (HCC)     Not on anticoag 2/2 hx of GIB/AVM  . CKD (chronic kidney disease), stage III   . Cholestatic hepatitis   .  UTI (lower urinary tract infection)     history  . Paroxysmal atrial flutter (HCC)     Not on anticoag 2/2 hx of GIB/AVM - dx 06/2013.  . Multifocal atrial tachycardia (HCC)   . Hypertrophic cardiomyopathy (HCC)     Echo 03/2011: severe LVH suggesting possble infiltrate cardiomyopathy or advanced hypertensive heart disease, increased  echogenicity of the ventricular septum as well as the pericardium, EF >70% with end systolicmid-cavitary obliteration of the ventricle, stage 1 DD, mild MR, small IVC.  Marland Kitchen Hypomagnesemia   . Chronic renal disease, stage 3, moderately decreased glomerular filtration rate between 30-59 mL/min/1.73 square meter 02/03/2014  . Anemia of chronic disease 11/25/2011  . Anemia of chronic renal failure, stage 3 (moderate) 02/03/2014  . Low back pain   . 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 Biatrial enlargement 04/04/2014  . History of pneumonia 12/2013  . Atrial fibrillation [facility])    Past Surgical History  Procedure Laterality Date  . Colonoscopy  04/2010  . Upper gastrointestinal endoscopy  04/2010  . Esophagogastroduodenoscopy  09/20/2011    Status post APC.  Marland Kitchen Esophagogastroduodenoscopy  09/20/2011    Procedure: ESOPHAGOGASTRODUODENOSCOPY (EGD);  Surgeon: Malissa Hippo, MD;  Location: [facility];  Service: Endoscopy;  Laterality: N/A;  . Cyst removal hand      Elbow  . Sebaceous cyst  removed      bilateral elbows  . Colonoscopy N/A 07/04/2014    Procedure: COLONOSCOPY;  Surgeon: Malissa Hippo, MD;  Location: [facility] END[facility];  Service: Endoscopy;  Laterality: N/A;  730  . Esophagogastroduodenoscopy N/A 07/04/2014    Procedure: ESOPHAGOGASTRODUODENOSCOPY (EGD);  Surgeon: Malissa Hippo, MD;  Location: [facility];  Service: Endoscopy;  Laterality: N/A;  . Givens capsule study N/A 07/14/2014    Procedure: GIVENS CAPSULE STUDY;  Surgeon: Malissa Hippo, MD;  Location: [facility];  Service: Endoscopy;  Laterality: N/A;  730   Family History  Problem Relation Age  of Onset  . Stroke Mother   . Hypertension Sister   . Hypertension Brother   . Heart disease Mother 19  . Heart disease Father 95  . COPD Brother   . Arthritis Brother   . Osteoporosis Sister    Social History  Substance Use Topics  . Smoking status: Current Every Day Smoker -- 0.25 packs/day for 50 years    Types: Cigarettes    Start date: 04/10/1945  . Smokeless tobacco: Never Used  . Alcohol Use: No   OB History    No data available     Review of Systems  Constitutional: Negative for fever and chills.  Respiratory: Negative for shortness of breath.   Cardiovascular: Negative for chest pain.  Musculoskeletal: Positive for myalgias, back pain and arthralgias.  All other systems reviewed and are negative.   Allergies  Iohexol; Ivp dye; Naprosyn; Red blood cells; Verapamil; Vicodin; and Sulfa antibiotics  Home Medications   Prior to Admission medications   Medication Sig Start Date End Date Taking? Authorizing Provider  acetaminophen (TYLENOL) 325 MG tablet Take 650 mg by mouth every 6 (six) hours as needed. For pain    Historical Provider, MD  aspirin 81 MG chewable tablet Chew 2 tablets (162 mg total) by mouth daily. 04/04/14   Elliot Cousin, MD  azaTHIOprine (IMURAN) 50 MG tablet TAKE 1 AND 1/2 TABLETS BY MOUTH ONCE DAILY 07/29/14   Malissa Hippo, MD  Cholecalciferol (VITAMIN D) 2000 UNITS tablet Take 2,000 Units by mouth daily.    Historical Provider, MD  Coenzyme Q10 200 MG capsule Take 200 mg by mouth daily.    Historical Provider, MD  Darbepoetin Alfa (ARANESP) 500 MCG/ML SOSY injection Inject 500 mcg into the skin every 21 ( twenty-one) days.    Historical Provider, MD  DEXILANT 60 MG capsule TAKE 1 CAPSULE BY MOUTH EVERY OTHER DAY 10/21/14   Malissa Hippo, MD  diltiazem (CARDIZEM CD) 360 MG 24 hr capsule  09/15/14   Historical Provider, MD  diltiazem (TIAZAC) 360 MG 24 hr capsule Take 1 capsule (360 mg total) by mouth daily. 01/06/14   Hollice Espy, MD  fish  oil-omega-3 fatty acids 1000 MG capsule Take 1 g by mouth daily.    Historical Provider, MD  fluticasone (FLONASE) 50 MCG/ACT nasal spray Place 2 sprays into both nostrils 2 (two) times daily. 03/26/14   Historical Provider, MD  folic acid (FOLVITE) 800 MCG tablet Take 800 mcg by mouth daily.     Historical Provider, MD  furosemide (LASIX) 20 MG tablet Take 20 mg by mouth daily as needed. For fluid retention 04/26/11   Malissa Hippo, MD  Hydrocodone-Acetaminophen 2.5-325 MG TABS Take 1 tablet by mouth 3 (three) times daily as needed. 09/22/14   Allene Pyo, MD  lidocaine-prilocaine (EMLA) cream Apply 1 application topically as needed.  06/20/14  Historical Provider, MD  Magnesium 400 MG TABS Take 1 tablet by mouth 2 (two) times daily.    Historical Provider, MD  metoprolol (LOPRESSOR) 50 MG tablet Take 1 tablet 2 times daily on Saturday and Sunday and resume one and a half tablets 2 times daily on Monday 04/06/14. Patient taking differently: Take 50 mg by mouth 2 (two) times daily.  04/04/14   Elliot Cousin, MD  Multiple Vitamins-Minerals (MULTIVITAMIN WITH MINERALS) tablet Take 1 tablet by mouth daily.      Historical Provider, MD  nystatin (MYCOSTATIN) 100000 UNIT/ML suspension TAKE BY MOUTH FOUR TIMES DAILY. 08/04/14   Malissa Hippo, MD  potassium chloride SA (K-DUR,KLOR-CON) 20 MEQ tablet TAKE 1 TABLET BY MOUTH ONCE DAILY Patient taking differently: Patient takes Monday, Wednesday, Friday 01/14/14   Malissa Hippo, MD  predniSONE (DELTASONE) 5 MG tablet TAKE 11/2 TABLETS BY MOUTH DAILY 07/29/14   Malissa Hippo, MD  spironolactone (ALDACTONE) 50 MG tablet TAKE 1 TABLET BY MOUTH DAILY 05/28/14   Len Blalock, NP  Travoprost, BAK Free, (TRAVATAN) 0.004 % SOLN ophthalmic solution Place 1 drop into both eyes at bedtime. 09/20/11   Elliot Cousin, MD  ursodiol (ACTIGALL) 300 MG capsule Take 1 capsule (300 mg total) by mouth 2 (two) times daily. 10/24/14   Malissa Hippo, MD  vitamin B-12  (CYANOCOBALAMIN) 1000 MCG tablet Take 500 mcg by mouth daily.     Historical Provider, MD   BP 131/89 mmHg  Pulse 73  Temp(Src) 98.4 F (36.9 C) (Oral)  Resp 18  Ht 5\' 5"  (1.651 m)  Wt 156 lb (70.761 kg)  BMI 25.96 kg/m2  SpO2 100% Physical Exam  Constitutional: She is oriented to person, place, and time. She appears well-developed and well-nourished. No distress.  HENT:  Head: Normocephalic and atraumatic.  Eyes: Conjunctivae are normal.  Cardiovascular: Normal rate.   Pulmonary/Chest: Effort normal.  Abdominal: She exhibits no distension.  Musculoskeletal:  TTP left buttock positive straight leg test on left NVI distally  Neurological: She is alert and oriented to person, place, and time.  Skin: Skin is warm and dry.  Psychiatric: She has a normal mood and affect.  Nursing note and vitals reviewed.   ED Course  Procedures   DIAGNOSTIC STUDIES:  Oxygen Saturation is 100% on RA, normal by my interpretation.    COORDINATION OF CARE:  11:50 AM Will order pain meds. Discussed treatment plan with pt at bedside and pt agreed to plan.  Labs Review Labs Reviewed - No data to display  Imaging Review No results found.   EKG Interpretation None      MDM   Final diagnoses:  Sciatica of left side    Symptoms and exam consistent with sciatica. No concerning "red flags." Plan symptomatic treatment.  I personally preformed the services scribed in my presence. The recorded information has been reviewed is accurate. , MD.     Raeford Razor, MD 11/09/14 2232

## 2014-10-29 NOTE — Discharge Instructions (Signed)
Radicular Pain °Radicular pain in either the arm or leg is usually from a bulging or herniated disk in the spine. A piece of the herniated disk may press against the nerves as the nerves exit the spine. This causes pain which is felt at the tips of the nerves down the arm or leg. Other causes of radicular pain may include: °· Fractures. °· Heart disease. °· Cancer. °· An abnormal and usually degenerative state of the nervous system or nerves (neuropathy). °Diagnosis may require CT or MRI scanning to determine the primary cause.  °Nerves that start at the neck (nerve roots) may cause radicular pain in the outer shoulder and arm. It can spread down to the thumb and fingers. The symptoms vary depending on which nerve root has been affected. In most cases radicular pain improves with conservative treatment. Neck problems may require physical therapy, a neck collar, or cervical traction. Treatment may take many weeks, and surgery may be considered if the symptoms do not improve.  °Conservative treatment is also recommended for sciatica. Sciatica causes pain to radiate from the lower back or buttock area down the leg into the foot. Often there is a history of back problems. Most patients with sciatica are better after 2 to 4 weeks of rest and other supportive care. Short term bed rest can reduce the disk pressure considerably. Sitting, however, is not a good position since this increases the pressure on the disk. You should avoid bending, lifting, and all other activities which make the problem worse. Traction can be used in severe cases. Surgery is usually reserved for patients who do not improve within the first months of treatment. °Only take over-the-counter or prescription medicines for pain, discomfort, or fever as directed by your caregiver. Narcotics and muscle relaxants may help by relieving more severe pain and spasm and by providing mild sedation. Cold or massage can give significant relief. Spinal manipulation  is not recommended. It can increase the degree of disc protrusion. Epidural steroid injections are often effective treatment for radicular pain. These injections deliver medicine to the spinal nerve in the space between the protective covering of the spinal cord and back bones (vertebrae). Your caregiver can give you more information about steroid injections. These injections are most effective when given within two weeks of the onset of pain.  °You should see your caregiver for follow up care as recommended. A program for neck and back injury rehabilitation with stretching and strengthening exercises is an important part of management.  °SEEK IMMEDIATE MEDICAL CARE IF: °· You develop increased pain, weakness, or numbness in your arm or leg. °· You develop difficulty with bladder or bowel control. °· You develop abdominal pain. °  °This information is not intended to replace advice given to you by your health care provider. Make sure you discuss any questions you have with your health care provider. °  °Document Released: 01/28/2004 Document Revised: 01/10/2014 Document Reviewed: 07/16/2014 °Elsevier Interactive Patient Education ©2016 [employer] ° °

## 2014-10-29 NOTE — ED Notes (Signed)
Pt states that she has been left hip pain going down her leg since Sunday but was unable to get up this morning the pain was so bad.  States that she has not fallen.

## 2014-10-29 NOTE — ED Notes (Signed)
MD at bedside. 

## 2014-10-30 ENCOUNTER — Encounter (INDEPENDENT_AMBULATORY_CARE_PROVIDER_SITE_OTHER): Payer: Self-pay | Admitting: *Deleted

## 2014-10-30 ENCOUNTER — Other Ambulatory Visit (INDEPENDENT_AMBULATORY_CARE_PROVIDER_SITE_OTHER): Payer: Self-pay | Admitting: *Deleted

## 2014-10-30 DIAGNOSIS — K51918 Ulcerative colitis, unspecified with other complication: Secondary | ICD-10-CM

## 2014-11-03 ENCOUNTER — Encounter (HOSPITAL_BASED_OUTPATIENT_CLINIC_OR_DEPARTMENT_OTHER): Payer: Medicare Other

## 2014-11-03 ENCOUNTER — Encounter (HOSPITAL_COMMUNITY): Payer: Self-pay

## 2014-11-03 VITALS — BP 104/55 | Temp 98.5°F | Resp 16

## 2014-11-03 DIAGNOSIS — D631 Anemia in chronic kidney disease: Secondary | ICD-10-CM

## 2014-11-03 DIAGNOSIS — N183 Chronic kidney disease, stage 3 unspecified: Secondary | ICD-10-CM

## 2014-11-03 DIAGNOSIS — D638 Anemia in other chronic diseases classified elsewhere: Secondary | ICD-10-CM

## 2014-11-03 DIAGNOSIS — D500 Iron deficiency anemia secondary to blood loss (chronic): Secondary | ICD-10-CM | POA: Diagnosis not present

## 2014-11-03 LAB — CBC WITH DIFFERENTIAL/PLATELET
BASOS PCT: 0 %
Basophils Absolute: 0 10*3/uL (ref 0.0–0.1)
EOS ABS: 0.2 10*3/uL (ref 0.0–0.7)
EOS PCT: 4 %
HCT: 29.4 % — ABNORMAL LOW (ref 36.0–46.0)
Hemoglobin: 9.3 g/dL — ABNORMAL LOW (ref 12.0–15.0)
Lymphocytes Relative: 11 %
Lymphs Abs: 0.5 10*3/uL — ABNORMAL LOW (ref 0.7–4.0)
MCH: 27.7 pg (ref 26.0–34.0)
MCHC: 31.6 g/dL (ref 30.0–36.0)
MCV: 87.5 fL (ref 78.0–100.0)
MONO ABS: 0.6 10*3/uL (ref 0.1–1.0)
MONOS PCT: 13 %
NEUTROS PCT: 71 %
Neutro Abs: 3 10*3/uL (ref 1.7–7.7)
PLATELETS: 81 10*3/uL — AB (ref 150–400)
RBC: 3.36 MIL/uL — ABNORMAL LOW (ref 3.87–5.11)
RDW: 26.2 % — AB (ref 11.5–15.5)
WBC: 4.3 10*3/uL (ref 4.0–10.5)

## 2014-11-03 MED ORDER — DARBEPOETIN ALFA 100 MCG/0.5ML IJ SOSY
100.0000 ug | PREFILLED_SYRINGE | Freq: Once | INTRAMUSCULAR | Status: AC
  Administered 2014-11-03: 100 ug via SUBCUTANEOUS

## 2014-11-03 MED ORDER — DARBEPOETIN ALFA 100 MCG/0.5ML IJ SOSY
PREFILLED_SYRINGE | INTRAMUSCULAR | Status: AC
  Filled 2014-11-03: qty 0.5

## 2014-11-03 NOTE — Patient Instructions (Signed)
[facility] at [facility] Discharge Instructions  RECOMMENDATIONS MADE BY THE CONSULTANT AND ANY TEST RESULTS WILL BE SENT TO YOUR REFERRING PHYSICIAN.  Aranesp injection today. Return as scheduled for lab work and injections. Return as scheduled for office visit.  Thank you for choosing [facility] at [facility] to provide your oncology and hematology care.  To afford each patient quality time with our provider, please arrive at least 15 minutes before your scheduled appointment time.    You need to re-schedule your appointment should you arrive 10 or more minutes late.  We strive to give you quality time with our providers, and arriving late affects you and other patients whose appointments are after yours.  Also, if you no show three or more times for appointments you may be dismissed from the clinic at the providers discretion.     Again, thank you for choosing [facility].  Our hope is that these requests will decrease the amount of time that you wait before being seen by our physicians.       _____________________________________________________________  Should you have questions after your visit to [facility], please contact our office at [phone] between the hours of 8:30 a.m. and 4:30 p.m.  Voicemails left after 4:30 p.m. will not be returned until the following business day.  For prescription refill requests, have your pharmacy contact our office.

## 2014-11-03 NOTE — Progress Notes (Signed)
Labs drawn

## 2014-11-03 NOTE — Progress Notes (Signed)
Eileen Santana presents today for injection per the provider's orders.  Aranesp administration without incident; see MAR for injection details.  Patient tolerated procedure well and without incident.  No questions or complaints noted at this time.  

## 2014-11-05 MED ORDER — OCTREOTIDE ACETATE 30 MG IM KIT
PACK | INTRAMUSCULAR | Status: AC
  Filled 2014-11-05: qty 1

## 2014-11-06 ENCOUNTER — Encounter: Payer: Self-pay | Admitting: Cardiovascular Disease

## 2014-11-06 ENCOUNTER — Ambulatory Visit (INDEPENDENT_AMBULATORY_CARE_PROVIDER_SITE_OTHER): Payer: Medicare Other | Admitting: Cardiovascular Disease

## 2014-11-06 VITALS — BP 126/60 | HR 73 | Ht 65.0 in | Wt 158.0 lb

## 2014-11-06 DIAGNOSIS — D509 Iron deficiency anemia, unspecified: Secondary | ICD-10-CM

## 2014-11-06 DIAGNOSIS — I4891 Unspecified atrial fibrillation: Secondary | ICD-10-CM

## 2014-11-06 DIAGNOSIS — I10 Essential (primary) hypertension: Secondary | ICD-10-CM | POA: Diagnosis not present

## 2014-11-06 DIAGNOSIS — I429 Cardiomyopathy, unspecified: Secondary | ICD-10-CM | POA: Diagnosis not present

## 2014-11-06 DIAGNOSIS — I639 Cerebral infarction, unspecified: Secondary | ICD-10-CM

## 2014-11-06 NOTE — Patient Instructions (Signed)
Your physician wants you to follow-up in: 1 year wit Dr Reggy Eye will receive a reminder letter in the mail two months in advance. If you don't receive a letter, please call our office to schedule the follow-up appointment.    Your physician recommends that you continue on your current medications as directed. Please refer to the Current Medication list given to you today.     If you need a refill on your cardiac medications before your next appointment, please call your pharmacy.       Thank you for choosing [facility] !

## 2014-11-06 NOTE — Progress Notes (Signed)
Patient ID: Eileen Santana, female   DOB: 08-14-1930, 81 y.o.   MRN: [id number]      SUBJECTIVE: The patient presents for routine follow-up. She has a h/o punctate strokes and takes 162 mg ASA. She developed rapid atrial flutter with a minimal troponin elevation (0.04 x 1). She is not deemed to be a suitable candidate for anticoagulation due to recurrent GI bleeds and transfusion-dependent anemia with a h/o anemia of chronic renal disease and rheumatoid arthritis. She also has a history of autoimmune hepatitis.  Echocardiogram on 04/03/14 demonstrated mild concentric left ventricular hypertrophy, LVEF 55-60%, severe biatrial enlargement, mild MR, and moderate TR.  Small bowel capsule study demonstrated single gastric and multiple proximal jejunal arteriovenous malformations without stigmata of bleeding and mild portal gastropathy.  She is feeling well today with respect to her heart and denies chest pain and palpitations.   Hemoglobin on 10/17 was 8.9. Hemoglobin on 10/31 was 9.3.  She is here with her provider, [name].  Soc: Her daughter Olegario Messier is a former Wellsite geologist of an ICU and currently teaches nursing. She is very attentive to her mother's health.    Review of Systems: As per "subjective", otherwise negative.  Allergies  Allergen Reactions  . Iohexol Swelling    IV Dye   . Ivp Dye [Iodinated Diagnostic Agents] Swelling    Hives  . Naprosyn [Naproxen] Hives and Itching  . Red Blood Cells Swelling and Dermatitis    With blood transfusion 2012  . Verapamil     Heart block (2nd degree) Takes Cardizem   . Vicodin [Hydrocodone-Acetaminophen] Hives and Itching  . Sulfa Antibiotics Itching and Rash    Current Outpatient Prescriptions  Medication Sig Dispense Refill  . acetaminophen (TYLENOL) 325 MG tablet Take 650 mg by mouth every 6 (six) hours as needed. For pain    . aspirin 81 MG chewable tablet Chew 2 tablets (162 mg total) by mouth daily.    Marland Kitchen azaTHIOprine (IMURAN)  50 MG tablet TAKE 1 AND 1/2 TABLETS BY MOUTH ONCE DAILY 60 tablet 5  . cetirizine (ZYRTEC) 10 MG tablet Take 10 mg by mouth daily as needed for allergies.    . Cholecalciferol (VITAMIN D) 2000 UNITS tablet Take 2,000 Units by mouth daily.    . Coenzyme Q10 200 MG capsule Take 200 mg by mouth daily.    . Darbepoetin Alfa (ARANESP) 500 MCG/ML SOSY injection Inject 500 mcg into the skin every 21 ( twenty-one) days.    Marland Kitchen DEXILANT 60 MG capsule TAKE 1 CAPSULE BY MOUTH EVERY OTHER DAY 45 capsule 3  . diltiazem (TIAZAC) 360 MG 24 hr capsule Take 1 capsule (360 mg total) by mouth daily. 30 capsule 1  . fish oil-omega-3 fatty acids 1000 MG capsule Take 1 g by mouth daily.    . fluticasone (FLONASE) 50 MCG/ACT nasal spray Place 2 sprays into both nostrils daily as needed for allergies.     . folic acid (FOLVITE) 800 MCG tablet Take 800 mcg by mouth daily.     . furosemide (LASIX) 20 MG tablet Take 20 mg by mouth daily as needed. For fluid retention    . Hydrocodone-Acetaminophen 2.5-325 MG TABS Take 1 tablet by mouth 3 (three) times daily as needed. (Patient taking differently: Take 1 tablet by mouth 3 (three) times daily as needed (pain). ) 45 tablet 0  . lidocaine-prilocaine (EMLA) cream Apply 1 application topically as needed (pain).     . Magnesium 400 MG TABS Take 1 tablet by mouth  2 (two) times daily.    . metoprolol (LOPRESSOR) 50 MG tablet Take 1 tablet 2 times daily on Saturday and Sunday and resume one and a half tablets 2 times daily on Monday 04/06/14. (Patient taking differently: Take 50 mg by mouth 2 (two) times daily. )    . Multiple Vitamins-Minerals (MULTIVITAMIN WITH MINERALS) tablet Take 1 tablet by mouth daily.      Marland Kitchen oxyCODONE-acetaminophen (PERCOCET/ROXICET) 5-325 MG tablet Take 1 tablet by mouth every 6 (six) hours as needed for severe pain. 12 tablet 0  . potassium chloride SA (K-DUR,KLOR-CON) 20 MEQ tablet TAKE 1 TABLET BY MOUTH ONCE DAILY (Patient taking differently: Patient takes  Monday, Wednesday, Friday) 30 tablet 5  . predniSONE (DELTASONE) 10 MG tablet Take 10 mg by mouth daily with breakfast.    . spironolactone (ALDACTONE) 50 MG tablet TAKE 1 TABLET BY MOUTH DAILY 30 tablet 6  . Travoprost, BAK Free, (TRAVATAN) 0.004 % SOLN ophthalmic solution Place 1 drop into both eyes at bedtime.    . ursodiol (ACTIGALL) 300 MG capsule Take 1 capsule (300 mg total) by mouth 2 (two) times daily. 60 capsule 5  . vitamin B-12 (CYANOCOBALAMIN) 1000 MCG tablet Take 500 mcg by mouth daily.      No current facility-administered medications for this visit.   Facility-Administered Medications Ordered in Other Visits  Medication Dose Route Frequency Provider Last Rate Last Dose  . 0.9 %  sodium chloride infusion   Intravenous Continuous Ellouise Newer, PA-C   Stopped at 10/04/13 1230  . diphenhydrAMINE (BENADRYL) capsule 25 mg  25 mg Oral Q6H PRN Glenford Peers, MD   25 mg at 05/13/11 6195    Past Medical History  Diagnosis Date  . History of GI bleed     Associated with NSAIDS  . Iron deficiency anemia     Transfusion dependent  . DJD (degenerative joint disease)   . Essential hypertension, benign   . History of colitis   . Ileitis   . Pancytopenia   . Autoimmune hepatitis (HCC)     With leukocytoclastic vasculitis  . Heart block AV second degree March 2013    a. Cardiology consult note 06/2013: "Question of previous second degree heart block, although review of cardiology notes indicates that this may have been actually blocked PACs when the patient was on verapamil."  . Bronchitis   . Steroid-induced myopathy   . Renal calculus 08/12/2011  . Rheumatoid arthritis(714.0)   . Colon ulcer 04/2010    NSAID related  . Gastric ulcer 04/2010    NSAID related  . UGI bleed 09/20/2011    Focal area of gastritis oozing of blood  . Gastric AVM 09/20/2011  . Candida esophagitis (HCC) 09/20/2011  . Paroxysmal atrial fibrillation (HCC)     Not on anticoag 2/2 hx of GIB/AVM  . CKD  (chronic kidney disease), stage III   . Cholestatic hepatitis   . UTI (lower urinary tract infection)     history  . Paroxysmal atrial flutter (HCC)     Not on anticoag 2/2 hx of GIB/AVM - dx 06/2013.  . Multifocal atrial tachycardia (HCC)   . Hypertrophic cardiomyopathy (HCC)     Echo 03/2011: severe LVH suggesting possble infiltrate cardiomyopathy or advanced hypertensive heart disease, increased  echogenicity of the ventricular septum as well as the pericardium, EF >70% with end systolicmid-cavitary obliteration of the ventricle, stage 1 DD, mild MR, small IVC.  Marland Kitchen Hypomagnesemia   . Chronic renal disease, stage 3, moderately  decreased glomerular filtration rate between 30-59 mL/min/1.73 square meter 02/03/2014  . Anemia of chronic disease 11/25/2011  . Anemia of chronic renal failure, stage 3 (moderate) 02/03/2014  . Low back pain   . 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 Biatrial enlargement 04/04/2014  . History of pneumonia 12/2013  . Atrial fibrillation [facility])     Past Surgical History  Procedure Laterality Date  . Colonoscopy  04/2010  . Upper gastrointestinal endoscopy  04/2010  . Esophagogastroduodenoscopy  09/20/2011    Status post APC.  Marland Kitchen Esophagogastroduodenoscopy  09/20/2011    Procedure: ESOPHAGOGASTRODUODENOSCOPY (EGD);  Surgeon: Malissa Hippo, MD;  Location: [facility];  Service: Endoscopy;  Laterality: N/A;  . Cyst removal hand      Elbow  . Sebaceous cyst removed      bilateral elbows  . Colonoscopy N/A 07/04/2014    Procedure: COLONOSCOPY;  Surgeon: Malissa Hippo, MD;  Location: [facility];  Service: Endoscopy;  Laterality: N/A;  730  . Esophagogastroduodenoscopy N/A 07/04/2014    Procedure: ESOPHAGOGASTRODUODENOSCOPY (EGD);  Surgeon: Malissa Hippo, MD;  Location: [facility];  Service: Endoscopy;  Laterality: N/A;  . Givens capsule study N/A 07/14/2014    Procedure: GIVENS CAPSULE STUDY;  Surgeon: Malissa Hippo, MD;  Location: [facility];  Service:  Endoscopy;  Laterality: N/A;  730    Social History   Social History  . Marital Status: Single    Spouse Name: N/A  . Number of Children: N/A  . Years of Education: N/A   Occupational History  . Not on file.   Social History Main Topics  . Smoking status: Current Some Day Smoker -- 0.25 packs/day for 50 years    Types: Cigarettes    Start date: 04/10/1945  . Smokeless tobacco: Never Used  . Alcohol Use: No  . Drug Use: No  . Sexual Activity: Not Currently   Other Topics Concern  . Not on file   Social History Narrative     Filed Vitals:   11/06/14 1600  BP: 126/60  Pulse: 73  Height: 5\' 5"  (1.651 m)  Weight: 158 lb (71.668 kg)  SpO2: 99%    PHYSICAL EXAM General: NAD HEENT: Normal. Neck: No JVD, no thyromegaly. Lungs: Clear to auscultation bilaterally with normal respiratory effort. CV: Nondisplaced PMI. Irregular rhythm, normal S1/S2, no S3, no murmur. No pretibial or periankle edema.  Abdomen: Soft,no distention.  Neurologic: Alert and oriented.  Psych: Normal affect. Skin: Stasis dermatitis of the legs. Musculoskeletal: No gross deformities. Extremities: No clubbing or cyanosis.   ECG: Most recent ECG reviewed.      ASSESSMENT AND PLAN: 1. Atrial flutter/fibrillation with RVR: HR currently well controlled on long-acting diltiazem 360 mg daily and metoprolol 75 mg twice daily. She is currently not a candidate for anticoagulation given recurrent GI bleeds and anemia with Hgb results reviewed above. Continue 162 mg ASA.    2. Cardiomyopathy: Normal LV systolic function by most recent echocardiogram. No evidence of heart failure. Continue spironolactone 50 mg daily along with Lasix as needed.  3. Essential HTN: Well controlled. No changes.  Dispo: f/u 1 year.  Prentice Docker, M.D., F.A.C.C.

## 2014-11-12 LAB — HEPATIC FUNCTION PANEL
ALK PHOS: 52 U/L (ref 33–130)
ALT: 12 U/L (ref 6–29)
AST: 357 U/L — ABNORMAL HIGH (ref 10–35)
Albumin: 2.7 g/dL — ABNORMAL LOW (ref 3.6–5.1)
BILIRUBIN DIRECT: 0.3 mg/dL — AB (ref <=0.2)
BILIRUBIN INDIRECT: 0.7 mg/dL (ref 0.2–1.2)
Total Bilirubin: 1 mg/dL (ref 0.2–1.2)
Total Protein: 5.5 g/dL — ABNORMAL LOW (ref 6.1–8.1)

## 2014-11-13 LAB — SEDIMENTATION RATE: SED RATE: 11 mm/h (ref 0–30)

## 2014-11-16 NOTE — Progress Notes (Signed)
Eileen Courier, MD [address] [place] [address]  Anemia of chronic disease - Plan: Comprehensive metabolic panel  Anemia of chronic renal failure, stage 3 (moderate) - Plan: ARANESP TREATMENT CONDITION, SCHEDULING COMMUNICATION INJECTION, SCHEDULING COMMUNICATION LAB, Darbepoetin Alfa (ARANESP) injection 100 mcg  CURRENT THERAPY: Intermittent IV Eileen Santana PRN. ESA therapy  INTERVAL HISTORY: Eileen Santana 81 y.o. female returns for followup of anemia of chronic disease with rheumatoid arthritis, anemia of chronic renal disease, and iron deficiency with blood loss secondary to intestinal AV malformations, chronic kidney disease.   I personally reviewed and went over laboratory results with the patient.  The results are noted within this dictation. Her Hgb is excellent at 10.5 g/dL.  WBC is stable.  Platelet count is stable as well.  Aranesp today.  Chart reviewed.  ED visit from 10/29/2014 noted for hip pain.  Work-up was suspicious for sciatica and therefore, she was started on Prednisone.  This prednisone, I wonder, may be contributing to her Hgb today.  She notes some LE edema, localized to her feet and ankles.  She admits to keeping them elevated at home for the majority of the day.  She denies any improvement in edema in AM.  I have reviewed her ECHO and her medications.  I do not see a cause there.  In her labs, she is hypoproteinemic and hypoalbuminemic.  This may be a cause of her edema.  She denies any pain in her LE, but notes that it feels like she cannot feel her feet well when they are swollen.  Today, she notes, her swelling is at a minimum.  Past Medical History  Diagnosis Date  . History of GI bleed     Associated with NSAIDS  . Iron deficiency anemia     Transfusion dependent  . DJD (degenerative joint disease)   . Essential hypertension, benign   . History of colitis   . Ileitis   . Pancytopenia   . Autoimmune hepatitis (HCC)     With  leukocytoclastic vasculitis  . Heart block AV second degree March 2013    a. Cardiology consult note 06/2013: "Question of previous second degree heart block, although review of cardiology notes indicates that this may have been actually blocked PACs when the patient was on verapamil."  . Bronchitis   . Steroid-induced myopathy   . Renal calculus 08/12/2011  . Rheumatoid arthritis(714.0)   . Colon ulcer 04/2010    NSAID related  . Gastric ulcer 04/2010    NSAID related  . UGI bleed 09/20/2011    Focal area of gastritis oozing of blood  . Gastric AVM 09/20/2011  . Candida esophagitis (HCC) 09/20/2011  . Paroxysmal atrial fibrillation (HCC)     Not on anticoag 2/2 hx of GIB/AVM  . CKD (chronic kidney disease), stage III   . Cholestatic hepatitis   . UTI (lower urinary tract infection)     history  . Paroxysmal atrial flutter (HCC)     Not on anticoag 2/2 hx of GIB/AVM - dx 06/2013.  . Multifocal atrial tachycardia (HCC)   . Hypertrophic cardiomyopathy (HCC)     Echo 03/2011: severe LVH suggesting possble infiltrate cardiomyopathy or advanced hypertensive heart disease, increased  echogenicity of the ventricular septum as well as the pericardium, EF >70% with end systolicmid-cavitary obliteration of the ventricle, stage 1 DD, mild MR, small IVC.  Marland Kitchen Hypomagnesemia   . Chronic renal disease, stage 3, moderately decreased glomerular filtration  rate between 30-59 mL/min/1.73 square meter 02/03/2014  . Anemia of chronic disease 11/25/2011  . Anemia of chronic renal failure, stage 3 (moderate) 02/03/2014  . Low back pain   . 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 Biatrial enlargement 04/04/2014  . History of pneumonia 12/2013  . Atrial fibrillation [facility])     has Other pancytopenia (HCC); Sedimentation rate elevation; Elevated liver enzymes; GI bleed; Microcytic anemia; Orthostatic hypotension; Bradycardia; Renal failure; Autoimmune hepatitis (HCC); HTN (hypertension), benign; Abdominal pain, acute,  generalized; Nausea; Diarrhea; Hypomagnesemia; Hypokalemia; Prolonged Q-T interval on ECG; UTI (urinary tract infection); Gallstones; Thrombocytopenia (HCC); Renal calculus; Chronic back pain; Leg pain; Rheumatoid arthritis (HCC); Acute renal insufficiency; Hypoalbuminemia; Guaiac positive stools; UGI bleed; Gastric AVM; Candida esophagitis (HCC); Anemia of chronic disease; PAT (paroxysmal atrial tachycardia) (HCC); Hypertrophic cardiomyopathy (HCC); Hypersplenism; Iron deficiency anemia due to chronic blood loss; AVM (congenital arteriovenous malformation); Atrial tachycardia (HCC); Atrial flutter (HCC); Atrial fibrillation with RVR (HCC); Healthcare-associated pneumonia; COPD (chronic obstructive pulmonary disease) (HCC); HCAP (healthcare-associated pneumonia); Atrial fibrillation with rapid ventricular response (HCC); Atrial flutter with rapid ventricular response (HCC); Chronic renal disease, stage 3, moderately decreased glomerular filtration rate between 30-59 mL/min/1.73 square meter; Anemia of chronic renal failure, stage 3 (moderate); TIA (transient ischemic attack); Adrenal insufficiency (HCC); Aphasia; Stroke, acute, embolic (HCC); Biatrial enlargement; and Blood loss anemia on her problem list.     is allergic to iohexol; ivp dye; naprosyn; red blood cells; verapamil; vicodin; and sulfa antibiotics.  Current Outpatient Prescriptions on File Prior to Visit  Medication Sig Dispense Refill  . acetaminophen (TYLENOL) 325 MG tablet Take 650 mg by mouth every 6 (six) hours as needed. For pain    . aspirin 81 MG chewable tablet Chew 2 tablets (162 mg total) by mouth daily.    Marland Kitchen azaTHIOprine (IMURAN) 50 MG tablet TAKE 1 AND 1/2 TABLETS BY MOUTH ONCE DAILY 60 tablet 5  . cetirizine (ZYRTEC) 10 MG tablet Take 10 mg by mouth daily as needed for allergies.    . Cholecalciferol (VITAMIN D) 2000 UNITS tablet Take 2,000 Units by mouth daily.    . Coenzyme Q10 200 MG capsule Take 200 mg by mouth daily.    .  Darbepoetin Alfa (ARANESP) 500 MCG/ML SOSY injection Inject 500 mcg into the skin every 21 ( twenty-one) days.    Marland Kitchen DEXILANT 60 MG capsule TAKE 1 CAPSULE BY MOUTH EVERY OTHER DAY 45 capsule 3  . diltiazem (TIAZAC) 360 MG 24 hr capsule Take 1 capsule (360 mg total) by mouth daily. 30 capsule 1  . fish oil-omega-3 fatty acids 1000 MG capsule Take 1 g by mouth daily.    . fluticasone (FLONASE) 50 MCG/ACT nasal spray Place 2 sprays into both nostrils daily as needed for allergies.     . folic acid (FOLVITE) 800 MCG tablet Take 800 mcg by mouth daily.     . furosemide (LASIX) 20 MG tablet Take 20 mg by mouth daily as needed. For fluid retention    . lidocaine-prilocaine (EMLA) cream Apply 1 application topically as needed (pain).     . Magnesium 400 MG TABS Take 1 tablet by mouth 2 (two) times daily.    . metoprolol (LOPRESSOR) 50 MG tablet Take 1 tablet 2 times daily on Saturday and Sunday and resume one and a half tablets 2 times daily on Monday 04/06/14. (Patient taking differently: Take 50 mg by mouth 2 (two) times daily. )    . Multiple Vitamins-Minerals (MULTIVITAMIN WITH MINERALS) tablet Take 1  tablet by mouth daily.      . potassium chloride SA (K-DUR,KLOR-CON) 20 MEQ tablet TAKE 1 TABLET BY MOUTH ONCE DAILY (Patient taking differently: Patient takes Monday, Wednesday, Friday) 30 tablet 5  . predniSONE (DELTASONE) 10 MG tablet Take 10 mg by mouth daily with breakfast.    . spironolactone (ALDACTONE) 50 MG tablet TAKE 1 TABLET BY MOUTH DAILY 30 tablet 6  . Travoprost, BAK Free, (TRAVATAN) 0.004 % SOLN ophthalmic solution Place 1 drop into both eyes at bedtime.    . ursodiol (ACTIGALL) 300 MG capsule Take 1 capsule (300 mg total) by mouth 2 (two) times daily. 60 capsule 5  . vitamin B-12 (CYANOCOBALAMIN) 1000 MCG tablet Take 500 mcg by mouth daily.     . Hydrocodone-Acetaminophen 2.5-325 MG TABS Take 1 tablet by mouth 3 (three) times daily as needed. (Patient not taking: Reported on 11/17/2014) 45  tablet 0  . oxyCODONE-acetaminophen (PERCOCET/ROXICET) 5-325 MG tablet Take 1 tablet by mouth every 6 (six) hours as needed for severe pain. (Patient not taking: Reported on 11/17/2014) 12 tablet 0   Current Facility-Administered Medications on File Prior to Visit  Medication Dose Route Frequency Provider Last Rate Last Dose  . 0.9 %  sodium chloride infusion   Intravenous Continuous Ellouise Newer, PA-C   Stopped at 10/04/13 1230  . diphenhydrAMINE (BENADRYL) capsule 25 mg  25 mg Oral Q6H PRN Glenford Peers, MD   25 mg at 05/13/11 3762    Past Surgical History  Procedure Laterality Date  . Colonoscopy  04/2010  . Upper gastrointestinal endoscopy  04/2010  . Esophagogastroduodenoscopy  09/20/2011    Status post APC.  Marland Kitchen Esophagogastroduodenoscopy  09/20/2011    Procedure: ESOPHAGOGASTRODUODENOSCOPY (EGD);  Surgeon: Malissa Hippo, MD;  [facility];  Service: Endoscopy;  Laterality: N/A;  . Cyst removal hand      Elbow  . Sebaceous cyst removed      bilateral elbows  . Colonoscopy N/A 07/04/2014    Procedure: COLONOSCOPY;  Surgeon: Malissa Hippo, MD;  Location: [facility];  Service: Endoscopy;  Laterality: N/A;  730  . Esophagogastroduodenoscopy N/A 07/04/2014    Procedure: ESOPHAGOGASTRODUODENOSCOPY (EGD);  Surgeon: Malissa Hippo, MD;  [facility];  Service: Endoscopy;  Laterality: N/A;  . Givens capsule study N/A 07/14/2014    Procedure: GIVENS CAPSULE STUDY;  Surgeon: Malissa Hippo, MD;  [facility]: [facility];  Service: Endoscopy;  Laterality: N/A;  730    Denies any headaches, dizziness, double vision, fevers, chills, night sweats, nausea, vomiting, diarrhea, constipation, chest pain, heart palpitations, shortness of breath, blood in stool, black tarry stool, urinary pain, urinary burning, urinary frequency, hematuria.   PHYSICAL EXAMINATION  ECOG PERFORMANCE STATUS: 2 - Symptomatic, <50% confined to bed  Filed Vitals:   11/17/14 1402  BP: 104/64    Pulse: 111  Temp: 98.9 F (37.2 C)  Resp: 18    GENERAL:alert, no distress, comfortable, cooperative and unaccompanied, in a wheelchair. SKIN: skin color, texture, turgor are normal, no rashes or significant lesions HEAD: Normocephalic, No masses, lesions, tenderness or abnormalities EYES: normal, EOMI, Conjunctiva are pink and non-injected EARS: External ears normal OROPHARYNX:lips, buccal mucosa, and tongue normal and mucous membranes are moist  NECK: supple, no adenopathy, trachea midline LYMPH:  no palpable lymphadenopathy BREAST:not examined LUNGS: clear to auscultation  HEART: irregularly irregular ABDOMEN:abdomen soft and normal bowel sounds BACK: Back symmetric, no curvature. EXTREMITIES:less then 2 second capillary refill, no joint deformities, effusion, or  inflammation, no skin discoloration, positive findings:  edema in ankles and feet B/L, R >> L, 2+ pitting, without erythema, heat, or pain.  NEURO: alert & oriented x 3 with fluent speech, no focal motor/sensory deficits    LABORATORY DATA: CBC    Component Value Date/Time   WBC 3.0* 11/17/2014 1313   RBC 3.74* 11/17/2014 1313   RBC 3.62* 03/16/2012 0937   HGB 10.5* 11/17/2014 1313   HCT 31.9* 11/17/2014 1313   PLT 86* 11/17/2014 1313   MCV 85.3 11/17/2014 1313   MCH 28.1 11/17/2014 1313   MCHC 32.9 11/17/2014 1313   RDW 22.5* 11/17/2014 1313   LYMPHSABS 0.6* 11/17/2014 1313   MONOABS 0.3 11/17/2014 1313   EOSABS 0.1 11/17/2014 1313   BASOSABS 0.1 11/17/2014 1313      Chemistry      Component Value Date/Time   NA 135 10/20/2014 1030   K 3.7 10/20/2014 1030   CL 100* 10/20/2014 1030   CO2 28 10/20/2014 1030   BUN 26* 10/20/2014 1030   CREATININE 1.34* 10/20/2014 1030   CREATININE 1.37* 03/18/2014 1026      Component Value Date/Time   CALCIUM 9.7 10/20/2014 1030   ALKPHOS 52 11/12/2014 1006   AST 357* 11/12/2014 1006   ALT 12 11/12/2014 1006   BILITOT 1.0 11/12/2014 1006      Lab Results   Component Value Date   IRON 25* 09/24/2013   TIBC 325 09/24/2013   FERRITIN 92 10/20/2014      PENDING LABS:   RADIOGRAPHIC STUDIES:  No results found.   PATHOLOGY:    ASSESSMENT AND PLAN:  Anemia of chronic disease Multifactorial, anemia of chronic disease with rheumatoid arthritis, anemia of chronic renal disease, and iron deficiency with blood loss secondary to intestinal AV malformations, chronic kidney disease.  Requiring intermittent IV iron in addition to ESA therapy.  Currently taking 100 mcg of Aranesp every 2 weeks beginning on 11/03/2014.  Will maintain a ferritin of 100 or greater with IV iron replacement.  Labs every 2 weeks: CBC  Labs every 4-6 weeks: ferritin  Labs every 3 months: CMET.  Recent ED visit with concerns for left sided sciatica and started on outpatient PO prednisone.  This may be contributing to her sudden response in Hgb, more so than the recent increase in Aranesp.    Ankle/pedal edema B/L, R>>L.  Recommend continued elevation.  She has Lasix at home which she is taking for her edema, but she does not want to take it daily due to the increased need for urination.  I will defer to the prescriber, but if it truly is for LE edema, every other day would be ok from a hematologic standpoint.  She no longer bowls, but she notes that she otherwise does all of the activities she wants to do.   Return in 3 months for follow-up.  If counts demonstrate signs of progressive cytopenias, will need to consider a repeat bone marrow aspiration and biopsy.    THERAPY PLAN:  Continue to monitor counts with continuation of count support with IV iron for ferritin less than 100 and continuation and manipulation of ESA therapy as indicated.  All questions were answered. The patient knows to call the clinic with any problems, questions or concerns. We can certainly see the patient much sooner if necessary.  Patient and plan discussed with Dr. Loma Messing and  she is in agreement with the aforementioned.   This note is electronically signed by: Tina Griffiths 11/17/2014  4:38 PM

## 2014-11-16 NOTE — Patient Instructions (Addendum)
[facility] at [facility] Discharge Instructions  RECOMMENDATIONS MADE BY THE CONSULTANT AND ANY TEST RESULTS WILL BE SENT TO YOUR REFERRING PHYSICIAN.  Exam and discussion by Dellis Anes, PA-C Will let your know if you need iron once we get your ferritin level back. Aranesp today Call with increased fatigue or shortness of breath.  Labs and aranesp every 2 weeks Office visit in 3 months.  Thank you for choosing [facility] at [facility] to provide your oncology and hematology care.  To afford each patient quality time with our provider, please arrive at least 15 minutes before your scheduled appointment time.    You need to re-schedule your appointment should you arrive 10 or more minutes late.  We strive to give you quality time with our providers, and arriving late affects you and other patients whose appointments are after yours.  Also, if you no show three or more times for appointments you may be dismissed from the clinic at the providers discretion.     Again, thank you for choosing [facility].  Our hope is that these requests will decrease the amount of time that you wait before being seen by our physicians.       _____________________________________________________________  Should you have questions after your visit to [facility], please contact our office at [phone] between the hours of 8:30 a.m. and 4:30 p.m.  Voicemails left after 4:30 p.m. will not be returned until the following business day.  For prescription refill requests, have your pharmacy contact our office.

## 2014-11-16 NOTE — Assessment & Plan Note (Addendum)
Multifactorial, anemia of chronic disease with rheumatoid arthritis, anemia of chronic renal disease, and iron deficiency with blood loss secondary to intestinal AV malformations, chronic kidney disease.  Requiring intermittent IV iron in addition to ESA therapy.  Currently taking 100 mcg of Aranesp every 2 weeks beginning on 11/03/2014.  Will maintain a ferritin of 100 or greater with IV iron replacement.  Labs every 2 weeks: CBC  Labs every 4-6 weeks: ferritin  Labs every 3 months: CMET.  Recent ED visit with concerns for left sided sciatica and started on outpatient PO prednisone.  This may be contributing to her sudden response in Hgb, more so than the recent increase in Aranesp.    Ankle/pedal edema B/L, R>>L.  Recommend continued elevation.  She has Lasix at home which she is taking for her edema, but she does not want to take it daily due to the increased need for urination.  I will defer to the prescriber, but if it truly is for LE edema, every other day would be ok from a hematologic standpoint.  She no longer bowls, but she notes that she otherwise does all of the activities she wants to do.   Return in 3 months for follow-up.  If counts demonstrate signs of progressive cytopenias, will need to consider a repeat bone marrow aspiration and biopsy.

## 2014-11-17 ENCOUNTER — Other Ambulatory Visit (HOSPITAL_COMMUNITY): Payer: Medicare Other

## 2014-11-17 ENCOUNTER — Telehealth (INDEPENDENT_AMBULATORY_CARE_PROVIDER_SITE_OTHER): Payer: Self-pay | Admitting: *Deleted

## 2014-11-17 ENCOUNTER — Other Ambulatory Visit (HOSPITAL_COMMUNITY): Payer: Self-pay | Admitting: Oncology

## 2014-11-17 ENCOUNTER — Encounter (HOSPITAL_BASED_OUTPATIENT_CLINIC_OR_DEPARTMENT_OTHER): Payer: Medicare Other

## 2014-11-17 ENCOUNTER — Encounter (HOSPITAL_COMMUNITY): Payer: Self-pay | Admitting: Oncology

## 2014-11-17 ENCOUNTER — Encounter (HOSPITAL_COMMUNITY): Payer: Medicare Other | Attending: Oncology | Admitting: Oncology

## 2014-11-17 ENCOUNTER — Ambulatory Visit (HOSPITAL_COMMUNITY): Payer: Medicare Other | Admitting: Hematology & Oncology

## 2014-11-17 ENCOUNTER — Encounter (HOSPITAL_COMMUNITY): Payer: Medicare Other | Attending: Oncology

## 2014-11-17 ENCOUNTER — Encounter (INDEPENDENT_AMBULATORY_CARE_PROVIDER_SITE_OTHER): Payer: Self-pay | Admitting: *Deleted

## 2014-11-17 VITALS — BP 104/64 | HR 111 | Temp 98.9°F | Resp 18 | Wt 158.6 lb

## 2014-11-17 DIAGNOSIS — D500 Iron deficiency anemia secondary to blood loss (chronic): Secondary | ICD-10-CM

## 2014-11-17 DIAGNOSIS — R609 Edema, unspecified: Secondary | ICD-10-CM | POA: Diagnosis not present

## 2014-11-17 DIAGNOSIS — D638 Anemia in other chronic diseases classified elsewhere: Secondary | ICD-10-CM | POA: Diagnosis present

## 2014-11-17 DIAGNOSIS — N183 Chronic kidney disease, stage 3 (moderate): Secondary | ICD-10-CM | POA: Diagnosis present

## 2014-11-17 DIAGNOSIS — D631 Anemia in chronic kidney disease: Secondary | ICD-10-CM | POA: Diagnosis present

## 2014-11-17 DIAGNOSIS — R7401 Elevation of levels of liver transaminase levels: Secondary | ICD-10-CM

## 2014-11-17 DIAGNOSIS — K51819 Other ulcerative colitis with unspecified complications: Secondary | ICD-10-CM

## 2014-11-17 DIAGNOSIS — R740 Nonspecific elevation of levels of transaminase and lactic acid dehydrogenase [LDH]: Principal | ICD-10-CM

## 2014-11-17 DIAGNOSIS — K51218 Ulcerative (chronic) proctitis with other complication: Secondary | ICD-10-CM

## 2014-11-17 DIAGNOSIS — M069 Rheumatoid arthritis, unspecified: Secondary | ICD-10-CM

## 2014-11-17 LAB — CBC WITH DIFFERENTIAL/PLATELET
BASOS PCT: 2 %
Basophils Absolute: 0.1 10*3/uL (ref 0.0–0.1)
Eosinophils Absolute: 0.1 10*3/uL (ref 0.0–0.7)
Eosinophils Relative: 5 %
HCT: 31.9 % — ABNORMAL LOW (ref 36.0–46.0)
HEMOGLOBIN: 10.5 g/dL — AB (ref 12.0–15.0)
LYMPHS ABS: 0.6 10*3/uL — AB (ref 0.7–4.0)
Lymphocytes Relative: 19 %
MCH: 28.1 pg (ref 26.0–34.0)
MCHC: 32.9 g/dL (ref 30.0–36.0)
MCV: 85.3 fL (ref 78.0–100.0)
MONO ABS: 0.3 10*3/uL (ref 0.1–1.0)
MONOS PCT: 11 %
NEUTROS ABS: 1.9 10*3/uL (ref 1.7–7.7)
NEUTROS PCT: 64 %
Platelets: 86 10*3/uL — ABNORMAL LOW (ref 150–400)
RBC: 3.74 MIL/uL — ABNORMAL LOW (ref 3.87–5.11)
RDW: 22.5 % — ABNORMAL HIGH (ref 11.5–15.5)
WBC: 3 10*3/uL — ABNORMAL LOW (ref 4.0–10.5)

## 2014-11-17 LAB — FERRITIN: FERRITIN: 104 ng/mL (ref 11–307)

## 2014-11-17 MED ORDER — DARBEPOETIN ALFA 100 MCG/0.5ML IJ SOSY
100.0000 ug | PREFILLED_SYRINGE | Freq: Once | INTRAMUSCULAR | Status: AC
  Administered 2014-11-17: 100 ug via SUBCUTANEOUS
  Filled 2014-11-17: qty 0.5

## 2014-11-17 NOTE — Telephone Encounter (Signed)
Per Dr.Rehman the patient will need to have labs drawn in 2 weeks. 

## 2014-11-17 NOTE — Progress Notes (Signed)
Please doctors encounter for more information 

## 2014-11-17 NOTE — Progress Notes (Signed)
Eileen Santana presents today for injection per MD orders. Aranesp 100 mcg administered SQ in right Abdomen. Administration without incident. Patient tolerated well.

## 2014-11-18 NOTE — Progress Notes (Signed)
Labs drawn

## 2014-11-25 ENCOUNTER — Encounter (HOSPITAL_BASED_OUTPATIENT_CLINIC_OR_DEPARTMENT_OTHER): Payer: Medicare Other

## 2014-11-25 VITALS — BP 96/55 | HR 78 | Temp 98.2°F | Resp 16

## 2014-11-25 DIAGNOSIS — D500 Iron deficiency anemia secondary to blood loss (chronic): Secondary | ICD-10-CM

## 2014-11-25 MED ORDER — SODIUM CHLORIDE 0.9 % IV SOLN
INTRAVENOUS | Status: DC
  Administered 2014-11-25: 11:00:00 via INTRAVENOUS

## 2014-11-25 MED ORDER — SODIUM CHLORIDE 0.9 % IV SOLN
125.0000 mg | Freq: Once | INTRAVENOUS | Status: AC
  Administered 2014-11-25: 125 mg via INTRAVENOUS
  Filled 2014-11-25: qty 10

## 2014-11-25 NOTE — Progress Notes (Signed)
1215:  Tolerated infusion w/o adverse reaction.

## 2014-11-25 NOTE — Patient Instructions (Signed)
[facility] at [facility] Discharge Instructions  RECOMMENDATIONS MADE BY THE CONSULTANT AND ANY TEST RESULTS WILL BE SENT TO YOUR REFERRING PHYSICIAN.  Sodium Ferric Gluconate Complex injection What is this medicine? SODIUM FERRIC GLUCONATE COMPLEX (SOE dee um FER ik GLOO koe nate KOM pleks) is an iron replacement. It is used with epoetin therapy to treat low iron levels in patients who are receiving hemodialysis. This medicine may be used for other purposes; ask your health care provider or pharmacist if you have questions. What should I tell my health care provider before I take this medicine? They need to know if you have any of the following conditions: -anemia that is not from iron deficiency -high levels of iron in the body -an unusual or allergic reaction to iron, benzyl alcohol, other medicines, foods, dyes, or preservatives -pregnant or are trying to become pregnant -breast-feeding How should I use this medicine? This medicine is for infusion into a vein. It is given by a health care professional in a hospital or clinic setting. Talk to your pediatrician regarding the use of this medicine in children. While this drug may be prescribed for children as young as 6 years old for selected conditions, precautions do apply. Overdosage: If you think you have taken too much of this medicine contact a poison control center or emergency room at once. NOTE: This medicine is only for you. Do not share this medicine with others. What if I miss a dose? It is important not to miss your dose. Call your doctor or health care professional if you are unable to keep an appointment. What may interact with this medicine? Do not take this medicine with any of the following medications: -deferoxamine -dimercaprol -other iron products This medicine may also interact with the following medications: -chloramphenicol -deferasirox -medicine for blood pressure like enalapril This  list may not describe all possible interactions. Give your health care provider a list of all the medicines, herbs, non-prescription drugs, or dietary supplements you use. Also tell them if you smoke, drink alcohol, or use illegal drugs. Some items may interact with your medicine. What should I watch for while using this medicine? Your condition will be monitored carefully while you are receiving this medicine. Visit your doctor for check-ups as directed. What side effects may I notice from receiving this medicine? Side effects that you should report to your doctor or health care professional as soon as possible: -allergic reactions like skin rash, itching or hives, swelling of the face, lips, or tongue -breathing problems -changes in hearing -changes in vision -chills, flushing, or sweating -fast, irregular heartbeat -feeling faint or lightheaded, falls -fever, flu-like symptoms -high or low blood pressure -pain, tingling, numbness in the hands or feet -severe pain in the chest, back, flanks, or groin -swelling of the ankles, feet, hands -trouble passing urine or change in the amount of urine -unusually weak or tired Side effects that usually do not require medical attention (report to your doctor or health care professional if they continue or are bothersome): -cramps -dark colored stools -diarrhea -headache -nausea, vomiting -stomach upset This list may not describe all possible side effects. Call your doctor for medical advice about side effects. You may report side effects to FDA at 1-800-FDA-1088. Where should I keep my medicine? This drug is given in a hospital or clinic and will not be stored at home. NOTE: This sheet is a summary. It may not cover all possible information. If you have questions about this  medicine, talk to your doctor, pharmacist, or health care provider.    2016, Elsevier/Gold Standard. (2007-08-22 15:58:57)   Thank you for choosing [facility] at [facility] to provide your oncology and hematology care.  To afford each patient quality time with our provider, please arrive at least 15 minutes before your scheduled appointment time.    You need to re-schedule your appointment should you arrive 10 or more minutes late.  We strive to give you quality time with our providers, and arriving late affects you and other patients whose appointments are after yours.  Also, if you no show three or more times for appointments you may be dismissed from the clinic at the providers discretion.     Again, thank you for choosing [facility].  Our hope is that these requests will decrease the amount of time that you wait before being seen by our physicians.       _____________________________________________________________  Should you have questions after your visit to [facility] please contact our office at [phone] between the hours of 8:30 a.m. and 4:30 p.m.  Voicemails left after 4:30 p.m. will not be returned until the following business day.  For prescription refill requests, have your pharmacy contact our office.

## 2014-11-26 ENCOUNTER — Other Ambulatory Visit (HOSPITAL_COMMUNITY): Payer: Self-pay

## 2014-11-26 DIAGNOSIS — D6489 Other specified anemias: Secondary | ICD-10-CM

## 2014-12-02 ENCOUNTER — Encounter (HOSPITAL_BASED_OUTPATIENT_CLINIC_OR_DEPARTMENT_OTHER): Payer: Medicare Other

## 2014-12-02 VITALS — BP 112/65 | HR 86 | Temp 97.8°F | Resp 16

## 2014-12-02 DIAGNOSIS — N183 Chronic kidney disease, stage 3 unspecified: Secondary | ICD-10-CM

## 2014-12-02 DIAGNOSIS — D631 Anemia in chronic kidney disease: Secondary | ICD-10-CM

## 2014-12-02 DIAGNOSIS — D638 Anemia in other chronic diseases classified elsewhere: Secondary | ICD-10-CM

## 2014-12-02 LAB — CBC WITH DIFFERENTIAL/PLATELET
BASOS ABS: 0 10*3/uL (ref 0.0–0.1)
BASOS PCT: 1 %
EOS ABS: 0.1 10*3/uL (ref 0.0–0.7)
Eosinophils Relative: 3 %
HCT: 32.8 % — ABNORMAL LOW (ref 36.0–46.0)
HEMOGLOBIN: 10.5 g/dL — AB (ref 12.0–15.0)
Lymphocytes Relative: 18 %
Lymphs Abs: 0.6 10*3/uL — ABNORMAL LOW (ref 0.7–4.0)
MCH: 26.9 pg (ref 26.0–34.0)
MCHC: 32 g/dL (ref 30.0–36.0)
MCV: 83.9 fL (ref 78.0–100.0)
MONO ABS: 0.5 10*3/uL (ref 0.1–1.0)
Monocytes Relative: 16 %
NEUTROS ABS: 1.9 10*3/uL (ref 1.7–7.7)
NEUTROS PCT: 62 %
PLATELETS: 81 10*3/uL — AB (ref 150–400)
RBC: 3.91 MIL/uL (ref 3.87–5.11)
RDW: 21.8 % — AB (ref 11.5–15.5)
Smear Review: DECREASED
WBC: 3.1 10*3/uL — ABNORMAL LOW (ref 4.0–10.5)

## 2014-12-02 LAB — HEPATIC FUNCTION PANEL
ALBUMIN: 3 g/dL — AB (ref 3.6–5.1)
ALK PHOS: 60 U/L (ref 33–130)
ALT: 12 U/L (ref 6–29)
AST: 367 U/L — ABNORMAL HIGH (ref 10–35)
Bilirubin, Direct: 0.4 mg/dL — ABNORMAL HIGH (ref <=0.2)
Indirect Bilirubin: 0.6 mg/dL (ref 0.2–1.2)
TOTAL PROTEIN: 5.8 g/dL — AB (ref 6.1–8.1)
Total Bilirubin: 1 mg/dL (ref 0.2–1.2)

## 2014-12-02 MED ORDER — DARBEPOETIN ALFA 100 MCG/0.5ML IJ SOSY
PREFILLED_SYRINGE | INTRAMUSCULAR | Status: AC
  Filled 2014-12-02: qty 0.5

## 2014-12-02 MED ORDER — DARBEPOETIN ALFA 100 MCG/0.5ML IJ SOSY
100.0000 ug | PREFILLED_SYRINGE | Freq: Once | INTRAMUSCULAR | Status: AC
  Administered 2014-12-02: 100 ug via SUBCUTANEOUS

## 2014-12-02 NOTE — Progress Notes (Signed)
Labs drawn

## 2014-12-02 NOTE — Patient Instructions (Signed)
[facility] at [facility] Discharge Instructions  RECOMMENDATIONS MADE BY THE CONSULTANT AND ANY TEST RESULTS WILL BE SENT TO YOUR REFERRING PHYSICIAN.  Hemoglobin 10.5 today. Aranesp 100 mcg injection given as ordered.  R Return as scheduled.  Thank you for choosing [facility] at [facility] to provide your oncology and hematology care.  To afford each patient quality time with our provider, please arrive at least 15 minutes before your scheduled appointment time.    You need to re-schedule your appointment should you arrive 10 or more minutes late.  We strive to give you quality time with our providers, and arriving late affects you and other patients whose appointments are after yours.  Also, if you no show three or more times for appointments you may be dismissed from the clinic at the providers discretion.     Again, thank you for choosing [facility].  Our hope is that these requests will decrease the amount of time that you wait before being seen by our physicians.       _____________________________________________________________  Should you have questions after your visit to [facility], please contact our office at [phone] between the hours of 8:30 a.m. and 4:30 p.m.  Voicemails left after 4:30 p.m. will not be returned until the following business day.  For prescription refill requests, have your pharmacy contact our office.

## 2014-12-02 NOTE — Progress Notes (Signed)
Eileen Santana presents today for injection per MD orders. Aranesp 100 mcg administered SQ in right Abdomen. Administration without incident. Patient tolerated well.  

## 2014-12-03 ENCOUNTER — Other Ambulatory Visit (INDEPENDENT_AMBULATORY_CARE_PROVIDER_SITE_OTHER): Payer: Self-pay | Admitting: Internal Medicine

## 2014-12-05 ENCOUNTER — Telehealth (INDEPENDENT_AMBULATORY_CARE_PROVIDER_SITE_OTHER): Payer: Self-pay | Admitting: *Deleted

## 2014-12-05 DIAGNOSIS — K754 Autoimmune hepatitis: Secondary | ICD-10-CM

## 2014-12-05 DIAGNOSIS — R740 Nonspecific elevation of levels of transaminase and lactic acid dehydrogenase [LDH]: Secondary | ICD-10-CM

## 2014-12-05 DIAGNOSIS — R7401 Elevation of levels of liver transaminase levels: Secondary | ICD-10-CM

## 2014-12-05 NOTE — Telephone Encounter (Signed)
Per Dr.Rehman the patient will need to have labs drawn in 2-3 weeks when she gets lab drawn at hospital.

## 2014-12-11 ENCOUNTER — Other Ambulatory Visit (INDEPENDENT_AMBULATORY_CARE_PROVIDER_SITE_OTHER): Payer: Self-pay | Admitting: Internal Medicine

## 2014-12-16 ENCOUNTER — Encounter (HOSPITAL_COMMUNITY): Payer: Medicare Other | Attending: Oncology

## 2014-12-16 ENCOUNTER — Encounter (HOSPITAL_BASED_OUTPATIENT_CLINIC_OR_DEPARTMENT_OTHER): Payer: Medicare Other

## 2014-12-16 DIAGNOSIS — D61818 Other pancytopenia: Secondary | ICD-10-CM | POA: Diagnosis present

## 2014-12-16 DIAGNOSIS — D6489 Other specified anemias: Secondary | ICD-10-CM

## 2014-12-16 DIAGNOSIS — D638 Anemia in other chronic diseases classified elsewhere: Secondary | ICD-10-CM | POA: Diagnosis not present

## 2014-12-16 DIAGNOSIS — D509 Iron deficiency anemia, unspecified: Secondary | ICD-10-CM | POA: Insufficient documentation

## 2014-12-16 DIAGNOSIS — D649 Anemia, unspecified: Secondary | ICD-10-CM | POA: Diagnosis present

## 2014-12-16 DIAGNOSIS — N289 Disorder of kidney and ureter, unspecified: Secondary | ICD-10-CM | POA: Diagnosis present

## 2014-12-16 DIAGNOSIS — D500 Iron deficiency anemia secondary to blood loss (chronic): Secondary | ICD-10-CM | POA: Insufficient documentation

## 2014-12-16 LAB — CBC WITH DIFFERENTIAL/PLATELET
Basophils Absolute: 0 10*3/uL (ref 0.0–0.1)
Basophils Relative: 1 %
EOS ABS: 0.1 10*3/uL (ref 0.0–0.7)
Eosinophils Relative: 3 %
HCT: 34.4 % — ABNORMAL LOW (ref 36.0–46.0)
HEMOGLOBIN: 11.1 g/dL — AB (ref 12.0–15.0)
LYMPHS ABS: 0.7 10*3/uL (ref 0.7–4.0)
Lymphocytes Relative: 19 %
MCH: 26.4 pg (ref 26.0–34.0)
MCHC: 32.3 g/dL (ref 30.0–36.0)
MCV: 81.9 fL (ref 78.0–100.0)
MONO ABS: 0.4 10*3/uL (ref 0.1–1.0)
MONOS PCT: 12 %
NEUTROS PCT: 66 %
Neutro Abs: 2.3 10*3/uL (ref 1.7–7.7)
PLATELETS: 88 10*3/uL — AB (ref 150–400)
RBC: 4.2 MIL/uL (ref 3.87–5.11)
RDW: 20.2 % — ABNORMAL HIGH (ref 11.5–15.5)
SMEAR REVIEW: DECREASED
WBC: 3.5 10*3/uL — ABNORMAL LOW (ref 4.0–10.5)

## 2014-12-16 LAB — FERRITIN: Ferritin: 47 ng/mL (ref 11–307)

## 2014-12-16 NOTE — Progress Notes (Signed)
Hemoglobin 11.1 today. Aranesp NOT GIVEN, treatment parameters not met.

## 2014-12-16 NOTE — Progress Notes (Signed)
LABS DRAWN

## 2014-12-17 ENCOUNTER — Other Ambulatory Visit (HOSPITAL_COMMUNITY): Payer: Self-pay | Admitting: Hematology & Oncology

## 2014-12-17 ENCOUNTER — Other Ambulatory Visit (HOSPITAL_COMMUNITY): Payer: Self-pay | Admitting: Oncology

## 2014-12-17 ENCOUNTER — Other Ambulatory Visit (HOSPITAL_COMMUNITY): Payer: Self-pay

## 2014-12-17 DIAGNOSIS — D500 Iron deficiency anemia secondary to blood loss (chronic): Secondary | ICD-10-CM

## 2014-12-22 ENCOUNTER — Encounter (HOSPITAL_BASED_OUTPATIENT_CLINIC_OR_DEPARTMENT_OTHER): Payer: Medicare Other

## 2014-12-22 VITALS — BP 101/59 | HR 76 | Temp 97.8°F | Resp 18

## 2014-12-22 DIAGNOSIS — D509 Iron deficiency anemia, unspecified: Secondary | ICD-10-CM | POA: Diagnosis not present

## 2014-12-22 MED ORDER — SODIUM CHLORIDE 0.9 % IV SOLN
INTRAVENOUS | Status: DC
  Administered 2014-12-22: 14:00:00 via INTRAVENOUS

## 2014-12-22 MED ORDER — FERUMOXYTOL INJECTION 510 MG/17 ML
510.0000 mg | Freq: Once | INTRAVENOUS | Status: AC
  Administered 2014-12-22: 510 mg via INTRAVENOUS
  Filled 2014-12-22: qty 17

## 2014-12-22 NOTE — Patient Instructions (Signed)
[facility] at [facility] Discharge Instructions  RECOMMENDATIONS MADE BY THE CONSULTANT AND ANY TEST RESULTS WILL BE SENT TO YOUR REFERRING PHYSICIAN.  IV Iron today.    Thank you for choosing [facility] at [facility] to provide your oncology and hematology care.  To afford each patient quality time with our provider, please arrive at least 15 minutes before your scheduled appointment time.    You need to re-schedule your appointment should you arrive 10 or more minutes late.  We strive to give you quality time with our providers, and arriving late affects you and other patients whose appointments are after yours.  Also, if you no show three or more times for appointments you may be dismissed from the clinic at the providers discretion.     Again, thank you for choosing [facility].  Our hope is that these requests will decrease the amount of time that you wait before being seen by our physicians.       _____________________________________________________________  Should you have questions after your visit to [facility], please contact our office at [phone] between the hours of 8:30 a.m. and 4:30 p.m.  Voicemails left after 4:30 p.m. will not be returned until the following business day.  For prescription refill requests, have your pharmacy contact our office.     

## 2014-12-22 NOTE — Progress Notes (Signed)
Patient tolerated infusion well.  VSS.   

## 2014-12-30 ENCOUNTER — Encounter (HOSPITAL_BASED_OUTPATIENT_CLINIC_OR_DEPARTMENT_OTHER): Payer: Medicare Other

## 2014-12-30 ENCOUNTER — Encounter (HOSPITAL_COMMUNITY): Payer: Medicare Other

## 2014-12-30 DIAGNOSIS — D6489 Other specified anemias: Secondary | ICD-10-CM | POA: Diagnosis present

## 2014-12-30 DIAGNOSIS — D500 Iron deficiency anemia secondary to blood loss (chronic): Secondary | ICD-10-CM | POA: Diagnosis not present

## 2014-12-30 LAB — CBC WITH DIFFERENTIAL/PLATELET
BASOS ABS: 0 10*3/uL (ref 0.0–0.1)
BASOS PCT: 1 %
EOS ABS: 0.1 10*3/uL (ref 0.0–0.7)
Eosinophils Relative: 3 %
HCT: 38.6 % (ref 36.0–46.0)
HEMOGLOBIN: 12.6 g/dL (ref 12.0–15.0)
Lymphocytes Relative: 16 %
Lymphs Abs: 0.6 10*3/uL — ABNORMAL LOW (ref 0.7–4.0)
MCH: 27 pg (ref 26.0–34.0)
MCHC: 32.6 g/dL (ref 30.0–36.0)
MCV: 82.7 fL (ref 78.0–100.0)
MONO ABS: 0.4 10*3/uL (ref 0.1–1.0)
MONOS PCT: 10 %
Neutro Abs: 2.7 10*3/uL (ref 1.7–7.7)
Neutrophils Relative %: 71 %
Platelets: 72 10*3/uL — ABNORMAL LOW (ref 150–400)
RBC: 4.67 MIL/uL (ref 3.87–5.11)
RDW: 22.7 % — ABNORMAL HIGH (ref 11.5–15.5)
WBC: 3.8 10*3/uL — ABNORMAL LOW (ref 4.0–10.5)

## 2014-12-30 NOTE — Patient Instructions (Signed)
[facility] at [facility] Discharge Instructions  RECOMMENDATIONS MADE BY THE CONSULTANT AND ANY TEST RESULTS WILL BE SENT TO YOUR REFERRING PHYSICIAN.  Hemoglobin 12.6 today.  Aranesp injection not needed.  Return as scheduled.  Thank you for choosing [facility] at [facility] to provide your oncology and hematology care.  To afford each patient quality time with our provider, please arrive at least 15 minutes before your scheduled appointment time.    You need to re-schedule your appointment should you arrive 10 or more minutes late.  We strive to give you quality time with our providers, and arriving late affects you and other patients whose appointments are after yours.  Also, if you no show three or more times for appointments you may be dismissed from the clinic at the providers discretion.     Again, thank you for choosing [facility].  Our hope is that these requests will decrease the amount of time that you wait before being seen by our physicians.       _____________________________________________________________  Should you have questions after your visit to [facility], please contact our office at [phone] between the hours of 8:30 a.m. and 4:30 p.m.  Voicemails left after 4:30 p.m. will not be returned until the following business day.  For prescription refill requests, have your pharmacy contact our office.

## 2014-12-30 NOTE — Progress Notes (Signed)
Hemoglobin 12.6 today. Aranesp not given treatment parameters not met.

## 2014-12-31 NOTE — Progress Notes (Signed)
LABS DRAWN

## 2015-01-06 ENCOUNTER — Ambulatory Visit (HOSPITAL_COMMUNITY): Payer: Medicare Other

## 2015-01-12 ENCOUNTER — Encounter (HOSPITAL_COMMUNITY): Payer: Medicare Other

## 2015-01-12 ENCOUNTER — Encounter (HOSPITAL_COMMUNITY): Payer: Medicare Other | Attending: Oncology

## 2015-01-12 DIAGNOSIS — D509 Iron deficiency anemia, unspecified: Secondary | ICD-10-CM | POA: Diagnosis present

## 2015-01-12 DIAGNOSIS — D500 Iron deficiency anemia secondary to blood loss (chronic): Secondary | ICD-10-CM

## 2015-01-12 DIAGNOSIS — D649 Anemia, unspecified: Secondary | ICD-10-CM | POA: Insufficient documentation

## 2015-01-12 DIAGNOSIS — D61818 Other pancytopenia: Secondary | ICD-10-CM | POA: Insufficient documentation

## 2015-01-12 DIAGNOSIS — N289 Disorder of kidney and ureter, unspecified: Secondary | ICD-10-CM | POA: Diagnosis present

## 2015-01-12 DIAGNOSIS — D6489 Other specified anemias: Secondary | ICD-10-CM

## 2015-01-12 DIAGNOSIS — D638 Anemia in other chronic diseases classified elsewhere: Secondary | ICD-10-CM

## 2015-01-12 LAB — CBC WITH DIFFERENTIAL/PLATELET
Basophils Absolute: 0 10*3/uL (ref 0.0–0.1)
Basophils Relative: 1 %
EOS PCT: 2 %
Eosinophils Absolute: 0.1 10*3/uL (ref 0.0–0.7)
HEMATOCRIT: 43.7 % (ref 36.0–46.0)
HEMOGLOBIN: 14.6 g/dL (ref 12.0–15.0)
LYMPHS ABS: 0.5 10*3/uL — AB (ref 0.7–4.0)
Lymphocytes Relative: 10 %
MCH: 28.3 pg (ref 26.0–34.0)
MCHC: 33.4 g/dL (ref 30.0–36.0)
MCV: 84.7 fL (ref 78.0–100.0)
MONOS PCT: 11 %
Monocytes Absolute: 0.5 10*3/uL (ref 0.1–1.0)
NEUTROS ABS: 3.6 10*3/uL (ref 1.7–7.7)
Neutrophils Relative %: 76 %
Platelets: 71 10*3/uL — ABNORMAL LOW (ref 150–400)
RBC: 5.16 MIL/uL — AB (ref 3.87–5.11)
RDW: 23.4 % — AB (ref 11.5–15.5)
WBC: 4.7 10*3/uL (ref 4.0–10.5)

## 2015-01-12 LAB — FERRITIN: FERRITIN: 273 ng/mL (ref 11–307)

## 2015-01-12 NOTE — Progress Notes (Signed)
Patient didn't need injection r/t lab results and treatment parameters.  Patient and daughter aware.  Lab results and schedule given to the patient.

## 2015-01-13 ENCOUNTER — Ambulatory Visit (INDEPENDENT_AMBULATORY_CARE_PROVIDER_SITE_OTHER): Payer: Medicare Other | Admitting: Internal Medicine

## 2015-01-13 ENCOUNTER — Encounter (INDEPENDENT_AMBULATORY_CARE_PROVIDER_SITE_OTHER): Payer: Self-pay | Admitting: Internal Medicine

## 2015-01-13 VITALS — BP 108/68 | HR 66 | Temp 97.2°F | Resp 18 | Ht 66.0 in | Wt 155.5 lb

## 2015-01-13 DIAGNOSIS — K219 Gastro-esophageal reflux disease without esophagitis: Secondary | ICD-10-CM | POA: Diagnosis not present

## 2015-01-13 DIAGNOSIS — K754 Autoimmune hepatitis: Secondary | ICD-10-CM | POA: Diagnosis not present

## 2015-01-13 DIAGNOSIS — D696 Thrombocytopenia, unspecified: Secondary | ICD-10-CM | POA: Diagnosis not present

## 2015-01-13 NOTE — Progress Notes (Signed)
Presenting complaint;  Follow-up for autoimmune hepatitis, GERD and history of iron deficiency anemia.  Subjective:  Patient is 82 year old African-American female who is here for scheduled visit. She was last seen on 10/07/2014. She states she had blood work yesterday via speciality clinic. She states she does not feel well. She complains of constant lower back pain radiating to both lower extremities. She denies numbness or weakness. She was seen by Dr. Loleta Chance and given prescription for hydrocodone but it does not help. She was also seen in emergency room on 10/29/2014 and felt to have sciatica. She denies heartburn regurgitation nausea or vomiting. She also denies abdominal pain melena or rectal bleeding. Her bowels move daily. She is taking Lasix on as-needed basis. She generally takes 3 to 4 doses per week. She states her holidays were terrible because of back pain.    Current Medications: Outpatient Encounter Prescriptions as of 01/13/2015  Medication Sig  . acetaminophen (TYLENOL) 325 MG tablet Take 650 mg by mouth every 6 (six) hours as needed. For pain  . aspirin 81 MG chewable tablet Chew 2 tablets (162 mg total) by mouth daily.  Marland Kitchen azaTHIOprine (IMURAN) 50 MG tablet TAKE 1 AND 1/2 TABLETS BY MOUTH ONCE DAILY  . cetirizine (ZYRTEC) 10 MG tablet Take 10 mg by mouth daily as needed for allergies.  . Cholecalciferol (VITAMIN D) 2000 UNITS tablet Take 2,000 Units by mouth daily.  . Coenzyme Q10 200 MG capsule Take 200 mg by mouth daily.  . Darbepoetin Alfa (ARANESP) 500 MCG/ML SOSY injection Inject 500 mcg into the skin every 21 ( twenty-one) days.  Marland Kitchen DEXILANT 60 MG capsule TAKE 1 CAPSULE BY MOUTH EVERY OTHER DAY  . diltiazem (TIAZAC) 360 MG 24 hr capsule Take 1 capsule (360 mg total) by mouth daily.  . fish oil-omega-3 fatty acids 1000 MG capsule Take 1 g by mouth daily.  . fluticasone (FLONASE) 50 MCG/ACT nasal spray Place 2 sprays into both nostrils daily as needed for allergies.   .  folic acid (FOLVITE) 800 MCG tablet Take 800 mcg by mouth daily.   . furosemide (LASIX) 20 MG tablet Take 20 mg by mouth daily as needed. For fluid retention  . Hydrocodone-Acetaminophen 2.5-325 MG TABS Take 1 tablet by mouth 3 (three) times daily as needed.  . lidocaine-prilocaine (EMLA) cream Apply 1 application topically as needed (pain).   . Magnesium 400 MG TABS Take 1 tablet by mouth 2 (two) times daily.  . metoprolol (LOPRESSOR) 50 MG tablet Take 1 tablet 2 times daily on Saturday and Sunday and resume one and a half tablets 2 times daily on Monday 04/06/14. (Patient taking differently: Take 50 mg by mouth 2 (two) times daily. )  . Multiple Vitamins-Minerals (MULTIVITAMIN WITH MINERALS) tablet Take 1 tablet by mouth daily.    Marland Kitchen oxyCODONE-acetaminophen (PERCOCET/ROXICET) 5-325 MG tablet Take 1 tablet by mouth every 6 (six) hours as needed for severe pain.  . potassium chloride SA (K-DUR,KLOR-CON) 20 MEQ tablet TAKE 1 TABLET BY MOUTH ONCE DAILY  . predniSONE (DELTASONE) 10 MG tablet Take 10 mg by mouth daily with breakfast.  . spironolactone (ALDACTONE) 50 MG tablet TAKE 1 TABLET BY MOUTH DAILY  . Travoprost, BAK Free, (TRAVATAN) 0.004 % SOLN ophthalmic solution Place 1 drop into both eyes at bedtime.  . ursodiol (ACTIGALL) 300 MG capsule TAKE 1 CAPSULE 3 TIMES DAILY.  . vitamin B-12 (CYANOCOBALAMIN) 1000 MCG tablet Take 500 mcg by mouth daily.    Facility-Administered Encounter Medications as of 01/13/2015  Medication  . 0.9 %  sodium chloride infusion  . diphenhydrAMINE (BENADRYL) capsule 25 mg     Objective: Blood pressure 108/68, pulse 66, temperature 97.2 F (36.2 C), temperature source Oral, resp. rate 18, height 5\' 6"  (1.676 m), weight 155 lb 8 oz (70.534 kg). Patient is alert and does not appear to be in acute distress. She has round facies. Conjunctiva is pink. Sclera is muddy but nonicteric Oropharyngeal mucosa is normal. No neck masses or thyromegaly noted. Cardiac exam  with iregular rhythm normal S1 and S2. Faint systolic ejection murmur noted at L LSB and aortic area.. Lungs are clear to auscultation. Abdomen is full but soft and nontender without organomegaly or masses.  She has trace edema around right ankle.   Labs/studies Results: Lab data from 01/12/2015   WBC 4.7, H&H 14.6 and 43.7 and platelet count 71K.   serum ferritin 273   LFTs from 12/01/2014 Bilirubin 1.0, AP 60, AST 367, ALT 12, total protein 5.8 and albumin 3.0.   Assessment:  #1. Autoimmune hepatitis. AST remains elevated. With normal ALT I feel AST is from nonhepatic source. Clearly there is no evidence of hemolysis. Source could be skeletal muscle but other markers for muscle injury have been normal such as aldolase. LFTs not done yesterday with her lab as recommended.  #2. GERD. She is doing well with every other day PPI.  #3. History of iron deficiency anemia secondary to chronic GI bleed. Hemoglobin is normal.  #4. Thrombocytopenia. She does not have stigmata of portal hypertension or splenomegaly. Therefore etiology of thrombocytopenia remains unclear.  #5. History of hypomagnesemia. #6. Low back pain of over 6 weeks duration to be further evaluated by Dr. 12/03/2014 patient's PCP.   Plan:  Patient will go the lab for LFTs metabolic 7 and serum magnesium. Will consider dropping prednisone dose after reviewing LFTs. Regarding patient's back pain she needs to make an appointment to see Dr. Loleta Chance as soon as possible.

## 2015-01-26 ENCOUNTER — Other Ambulatory Visit (HOSPITAL_COMMUNITY): Payer: Medicare Other

## 2015-01-27 ENCOUNTER — Encounter (HOSPITAL_COMMUNITY): Payer: Medicare Other

## 2015-01-27 ENCOUNTER — Other Ambulatory Visit (HOSPITAL_COMMUNITY)
Admission: RE | Admit: 2015-01-27 | Discharge: 2015-01-27 | Disposition: A | Discharge status: Home / Self Care | Payer: Medicare Other | Source: Ambulatory Visit | Attending: Internal Medicine | Admitting: Internal Medicine

## 2015-01-27 DIAGNOSIS — K754 Autoimmune hepatitis: Secondary | ICD-10-CM | POA: Diagnosis present

## 2015-01-27 DIAGNOSIS — D6489 Other specified anemias: Secondary | ICD-10-CM

## 2015-01-27 DIAGNOSIS — D500 Iron deficiency anemia secondary to blood loss (chronic): Secondary | ICD-10-CM | POA: Diagnosis not present

## 2015-01-27 LAB — CBC WITH DIFFERENTIAL/PLATELET
BASOS ABS: 0 10*3/uL (ref 0.0–0.1)
Basophils Relative: 1 %
EOS ABS: 0.1 10*3/uL (ref 0.0–0.7)
EOS PCT: 3 %
HCT: 38.8 % (ref 36.0–46.0)
HEMOGLOBIN: 13.1 g/dL (ref 12.0–15.0)
LYMPHS ABS: 0.5 10*3/uL — AB (ref 0.7–4.0)
Lymphocytes Relative: 13 %
MCH: 28.5 pg (ref 26.0–34.0)
MCHC: 33.8 g/dL (ref 30.0–36.0)
MCV: 84.5 fL (ref 78.0–100.0)
Monocytes Absolute: 0.5 10*3/uL (ref 0.1–1.0)
Monocytes Relative: 11 %
NEUTROS PCT: 73 %
Neutro Abs: 3.1 10*3/uL (ref 1.7–7.7)
PLATELETS: 63 10*3/uL — AB (ref 150–400)
RBC: 4.59 MIL/uL (ref 3.87–5.11)
RDW: 22.9 % — ABNORMAL HIGH (ref 11.5–15.5)
WBC: 4.2 10*3/uL (ref 4.0–10.5)

## 2015-01-27 LAB — HEPATIC FUNCTION PANEL
ALBUMIN: 2.8 g/dL — AB (ref 3.5–5.0)
ALK PHOS: 52 U/L (ref 38–126)
ALT: 17 U/L (ref 14–54)
AST: 380 U/L — AB (ref 15–41)
BILIRUBIN INDIRECT: 0.8 mg/dL (ref 0.3–0.9)
Bilirubin, Direct: 0.4 mg/dL (ref 0.1–0.5)
TOTAL PROTEIN: 6 g/dL — AB (ref 6.5–8.1)
Total Bilirubin: 1.2 mg/dL (ref 0.3–1.2)

## 2015-01-27 LAB — BASIC METABOLIC PANEL
ANION GAP: 7 (ref 5–15)
BUN: 24 mg/dL — ABNORMAL HIGH (ref 6–20)
CALCIUM: 10.2 mg/dL (ref 8.9–10.3)
CHLORIDE: 102 mmol/L (ref 101–111)
CO2: 25 mmol/L (ref 22–32)
Creatinine, Ser: 1.5 mg/dL — ABNORMAL HIGH (ref 0.44–1.00)
GFR calc non Af Amer: 31 mL/min — ABNORMAL LOW (ref >60)
GFR, EST AFRICAN AMERICAN: 36 mL/min — AB (ref >60)
GLUCOSE: 193 mg/dL — AB (ref 65–99)
POTASSIUM: 4 mmol/L (ref 3.5–5.1)
Sodium: 134 mmol/L — ABNORMAL LOW (ref 135–145)

## 2015-01-27 LAB — MAGNESIUM: Magnesium: 1.7 mg/dL (ref 1.7–2.4)

## 2015-01-27 NOTE — Patient Instructions (Signed)
[facility] at [facility] Discharge Instructions  RECOMMENDATIONS MADE BY THE CONSULTANT AND ANY TEST RESULTS WILL BE SENT TO YOUR REFERRING PHYSICIAN.  Hemoglobin 13.1 today. Aranesp not given/not needed. Return as scheduled.  Thank you for choosing [facility] at [facility] to provide your oncology and hematology care.  To afford each patient quality time with our provider, please arrive at least 15 minutes before your scheduled appointment time.   Beginning January 23rd 2017 lab work for the [facility] will be done in the  Main lab at [facility] on 1st floor. If you have a lab appointment with the Cancer Center please come in thru the  Main Entrance and check in at the main information desk  You need to re-schedule your appointment should you arrive 10 or more minutes late.  We strive to give you quality time with our providers, and arriving late affects you and other patients whose appointments are after yours.  Also, if you no show three or more times for appointments you may be dismissed from the clinic at the providers discretion.     Again, thank you for choosing [facility].  Our hope is that these requests will decrease the amount of time that you wait before being seen by our physicians.       _____________________________________________________________  Should you have questions after your visit to [facility], please contact our office at [phone] between the hours of 8:30 a.m. and 4:30 p.m.  Voicemails left after 4:30 p.m. will not be returned until the following business day.  For prescription refill requests, have your pharmacy contact our office.

## 2015-01-27 NOTE — Progress Notes (Signed)
Per Chase in lab, hemoglobin is 13.1 today. Unable to view lab results in Epic/verbal result given. Aranesp not given, treatment parameters not met.

## 2015-01-30 ENCOUNTER — Telehealth (INDEPENDENT_AMBULATORY_CARE_PROVIDER_SITE_OTHER): Payer: Self-pay | Admitting: *Deleted

## 2015-01-30 ENCOUNTER — Encounter (INDEPENDENT_AMBULATORY_CARE_PROVIDER_SITE_OTHER): Payer: Self-pay | Admitting: *Deleted

## 2015-01-30 DIAGNOSIS — K754 Autoimmune hepatitis: Secondary | ICD-10-CM

## 2015-01-30 NOTE — Telephone Encounter (Signed)
Per Dr.Rehman the patient will need to have labs drawn in 4 weeks.   

## 2015-01-31 ENCOUNTER — Emergency Department (HOSPITAL_COMMUNITY): Payer: Medicare Other

## 2015-01-31 ENCOUNTER — Encounter (HOSPITAL_COMMUNITY): Payer: Self-pay | Admitting: Emergency Medicine

## 2015-01-31 ENCOUNTER — Emergency Department (HOSPITAL_COMMUNITY)
Admission: EM | Admit: 2015-01-31 | Discharge: 2015-01-31 | Disposition: A | Discharge status: Home / Self Care | Payer: Medicare Other | Attending: Emergency Medicine | Admitting: Emergency Medicine

## 2015-01-31 DIAGNOSIS — I129 Hypertensive chronic kidney disease with stage 1 through stage 4 chronic kidney disease, or unspecified chronic kidney disease: Secondary | ICD-10-CM | POA: Diagnosis not present

## 2015-01-31 DIAGNOSIS — M5442 Lumbago with sciatica, left side: Secondary | ICD-10-CM | POA: Insufficient documentation

## 2015-01-31 DIAGNOSIS — Z8673 Personal history of transient ischemic attack (TIA), and cerebral infarction without residual deficits: Secondary | ICD-10-CM | POA: Diagnosis not present

## 2015-01-31 DIAGNOSIS — Z87442 Personal history of urinary calculi: Secondary | ICD-10-CM | POA: Diagnosis not present

## 2015-01-31 DIAGNOSIS — Z87440 Personal history of urinary (tract) infections: Secondary | ICD-10-CM | POA: Diagnosis not present

## 2015-01-31 DIAGNOSIS — I480 Paroxysmal atrial fibrillation: Secondary | ICD-10-CM | POA: Diagnosis not present

## 2015-01-31 DIAGNOSIS — F17210 Nicotine dependence, cigarettes, uncomplicated: Secondary | ICD-10-CM | POA: Diagnosis not present

## 2015-01-31 DIAGNOSIS — Z8669 Personal history of other diseases of the nervous system and sense organs: Secondary | ICD-10-CM | POA: Insufficient documentation

## 2015-01-31 DIAGNOSIS — Z7952 Long term (current) use of systemic steroids: Secondary | ICD-10-CM | POA: Insufficient documentation

## 2015-01-31 DIAGNOSIS — Z8701 Personal history of pneumonia (recurrent): Secondary | ICD-10-CM | POA: Diagnosis not present

## 2015-01-31 DIAGNOSIS — Z8719 Personal history of other diseases of the digestive system: Secondary | ICD-10-CM | POA: Insufficient documentation

## 2015-01-31 DIAGNOSIS — D631 Anemia in chronic kidney disease: Secondary | ICD-10-CM | POA: Diagnosis not present

## 2015-01-31 DIAGNOSIS — I4892 Unspecified atrial flutter: Secondary | ICD-10-CM | POA: Insufficient documentation

## 2015-01-31 DIAGNOSIS — M069 Rheumatoid arthritis, unspecified: Secondary | ICD-10-CM | POA: Diagnosis not present

## 2015-01-31 DIAGNOSIS — Z7982 Long term (current) use of aspirin: Secondary | ICD-10-CM | POA: Diagnosis not present

## 2015-01-31 DIAGNOSIS — Q2733 Arteriovenous malformation of digestive system vessel: Secondary | ICD-10-CM | POA: Insufficient documentation

## 2015-01-31 DIAGNOSIS — Z8619 Personal history of other infectious and parasitic diseases: Secondary | ICD-10-CM | POA: Diagnosis not present

## 2015-01-31 DIAGNOSIS — N183 Chronic kidney disease, stage 3 (moderate): Secondary | ICD-10-CM | POA: Diagnosis not present

## 2015-01-31 DIAGNOSIS — Z79899 Other long term (current) drug therapy: Secondary | ICD-10-CM | POA: Insufficient documentation

## 2015-01-31 DIAGNOSIS — M545 Low back pain: Secondary | ICD-10-CM | POA: Diagnosis present

## 2015-01-31 MED ORDER — DEXAMETHASONE SODIUM PHOSPHATE 4 MG/ML IJ SOLN
12.0000 mg | Freq: Once | INTRAMUSCULAR | Status: AC
  Administered 2015-01-31: 12 mg via INTRAMUSCULAR
  Filled 2015-01-31: qty 3

## 2015-01-31 MED ORDER — HYDROMORPHONE HCL 1 MG/ML IJ SOLN
1.0000 mg | Freq: Once | INTRAMUSCULAR | Status: AC
  Administered 2015-01-31: 1 mg via INTRAMUSCULAR
  Filled 2015-01-31: qty 1

## 2015-01-31 MED ORDER — OXYCODONE-ACETAMINOPHEN 5-325 MG PO TABS: 1.0000 | ORAL_TABLET | Freq: Four times a day (QID) | ORAL | Status: DC | PRN

## 2015-01-31 NOTE — ED Notes (Signed)
Patient c/o pain that radiates from lower back into legs bilaterally with a burning sensation. Patient states "I can't hardly walk and have to have someone with me when I do." Per daughter patient diagnosed with degenerative disc disease and is supposed to see a rheumatoid specialist but needs referral.

## 2015-01-31 NOTE — Discharge Instructions (Signed)

## 2015-02-04 NOTE — ED Provider Notes (Signed)
CSN: [id number]     Arrival date & time 01/31/15  1355 History   First MD Initiated Contact with Patient 01/31/15 1506     Chief Complaint  Patient presents with  . Back Pain     (Consider location/radiation/quality/duration/timing/severity/associated sxs/prior Treatment) HPI   82 year old female presenting with family for evaluation of lower back pain.I actually evaluated herin October for essentially the same complaints. She reports that same pain has returned and worsening over the last week or 2. She denies any acute trauma. Pain is worse with movement. Radiates with a burning sensation down into bilateral thighs. Pain is getting to the point that is interfering with her daily activities.denies any lower extremity weakness. No urinary complaints. No fevers or chills.  Past Medical History  Diagnosis Date  . History of GI bleed     Associated with NSAIDS  . Iron deficiency anemia     Transfusion dependent  . DJD (degenerative joint disease)   . Essential hypertension, benign   . History of colitis   . Ileitis   . Pancytopenia   . Autoimmune hepatitis (HCC)     With leukocytoclastic vasculitis  . Heart block AV second degree March 2013    a. Cardiology consult note 06/2013: "Question of previous second degree heart block, although review of cardiology notes indicates that this may have been actually blocked PACs when the patient was on verapamil."  . Bronchitis   . Steroid-induced myopathy   . Renal calculus 08/12/2011  . Rheumatoid arthritis(714.0)   . Colon ulcer 04/2010    NSAID related  . Gastric ulcer 04/2010    NSAID related  . UGI bleed 09/20/2011    Focal area of gastritis oozing of blood  . Gastric AVM 09/20/2011  . Candida esophagitis (HCC) 09/20/2011  . Paroxysmal atrial fibrillation (HCC)     Not on anticoag 2/2 hx of GIB/AVM  . CKD (chronic kidney disease), stage III   . Cholestatic hepatitis   . UTI (lower urinary tract infection)     history  . Paroxysmal  atrial flutter (HCC)     Not on anticoag 2/2 hx of GIB/AVM - dx 06/2013.  . Multifocal atrial tachycardia (HCC)   . Hypertrophic cardiomyopathy (HCC)     Echo 03/2011: severe LVH suggesting possble infiltrate cardiomyopathy or advanced hypertensive heart disease, increased  echogenicity of the ventricular septum as well as the pericardium, EF >70% with end systolicmid-cavitary obliteration of the ventricle, stage 1 DD, mild MR, small IVC.  Marland Kitchen Hypomagnesemia   . Chronic renal disease, stage 3, moderately decreased glomerular filtration rate between 30-59 mL/min/1.73 square meter 02/03/2014  . Anemia of chronic disease 11/25/2011  . Anemia of chronic renal failure, stage 3 (moderate) 02/03/2014  . Low back pain   . 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 Biatrial enlargement 04/04/2014  . History of pneumonia 12/2013  . Atrial fibrillation [facility])    Past Surgical History  Procedure Laterality Date  . Colonoscopy  04/2010  . Upper gastrointestinal endoscopy  04/2010  . Esophagogastroduodenoscopy  09/20/2011    Status post APC.  Marland Kitchen Esophagogastroduodenoscopy  09/20/2011    Procedure: ESOPHAGOGASTRODUODENOSCOPY (EGD);  Surgeon: Malissa Hippo, MD;  Location: [facility] ENDO SU[facility];  Service: Endoscopy;  Laterality: N/A;  . Cyst removal hand      Elbow  . Sebaceous cyst removed      bilateral elbows  . Colonoscopy N/A 07/04/2014    Procedure: COLONOSCOPY;  Surgeon: Malissa Hippo, MD;  Location: [facility] ENDO  SUITE;  Service: Endoscopy;  Laterality: N/A;  730  . Esophagogastroduodenoscopy N/A 07/04/2014    Procedure: ESOPHAGOGASTRODUODENOSCOPY (EGD);  Surgeon: Malissa Hippo, MD;  Location: [facility] ENDO SU[facility];  Service: Endoscopy;  Laterality: N/A;  . Givens capsule study N/A 07/14/2014    Procedure: GIVENS CAPSULE STUDY;  Surgeon: Malissa Hippo, MD;  Location: [facility] ENDO SU[facility];  Service: Endoscopy;  Laterality: N/A;  730   Family History  Problem Relation Age of Onset  . Stroke Mother   . Hypertension Sister   .  Hypertension Brother   . Heart disease Mother 78  . Heart disease Father 95  . COPD Brother   . Arthritis Brother   . Osteoporosis Sister    Social History  Substance Use Topics  . Smoking status: Current Some Day Smoker -- 0.25 packs/day for 50 years    Types: Cigarettes    Start date: 04/10/1945  . Smokeless tobacco: Never Used  . Alcohol Use: No   OB History    No data available     Review of Systems  All systems reviewed and negative, other than as noted in HPI.   Allergies  Iohexol; Ivp dye; Naprosyn; Red blood cells; Verapamil; Vicodin; and Sulfa antibiotics  Home Medications   Prior to Admission medications   Medication Sig Start Date End Date Taking? Authorizing Provider  acetaminophen (TYLENOL) 325 MG tablet Take 650 mg by mouth every 6 (six) hours as needed. For pain   Yes Historical Provider, MD  aspirin 81 MG chewable tablet Chew 2 tablets (162 mg total) by mouth daily. 04/04/14  Yes Elliot Cousin, MD  azaTHIOprine (IMURAN) 50 MG tablet TAKE 1 AND 1/2 TABLETS BY MOUTH ONCE DAILY 07/29/14  Yes Malissa Hippo, MD  cetirizine (ZYRTEC) 10 MG tablet Take 10 mg by mouth daily as needed for allergies.   Yes Historical Provider, MD  Cholecalciferol (VITAMIN D) 2000 UNITS tablet Take 2,000 Units by mouth daily.   Yes Historical Provider, MD  Coenzyme Q10 200 MG capsule Take 200 mg by mouth daily.   Yes Historical Provider, MD  Darbepoetin Alfa (ARANESP) 500 MCG/ML SOSY injection Inject 500 mcg into the skin every 21 ( twenty-one) days.   Yes Historical Provider, MD  DEXILANT 60 MG capsule TAKE 1 CAPSULE BY MOUTH EVERY OTHER DAY 10/21/14  Yes Malissa Hippo, MD  diltiazem (TIAZAC) 360 MG 24 hr capsule Take 1 capsule (360 mg total) by mouth daily. 01/06/14  Yes Hollice Espy, MD  fish oil-omega-3 fatty acids 1000 MG capsule Take 1 g by mouth daily.   Yes Historical Provider, MD  fluticasone (FLONASE) 50 MCG/ACT nasal spray Place 2 sprays into both nostrils daily as needed  for allergies.  03/26/14  Yes Historical Provider, MD  folic acid (FOLVITE) 800 MCG tablet Take 800 mcg by mouth daily.    Yes Historical Provider, MD  furosemide (LASIX) 20 MG tablet Take 20 mg by mouth daily as needed. For fluid retention 04/26/11  Yes Malissa Hippo, MD  Hydrocodone-Acetaminophen 2.5-325 MG TABS Take 1 tablet by mouth 3 (three) times daily as needed. 09/22/14  Yes Allene Pyo, MD  lidocaine-prilocaine (EMLA) cream Apply 1 application topically as needed (pain).  06/20/14  Yes Historical Provider, MD  Magnesium 400 MG TABS Take 1 tablet by mouth 2 (two) times daily.   Yes Historical Provider, MD  metoprolol (LOPRESSOR) 50 MG tablet Take 1 tablet 2 times daily on Saturday and Sunday and resume one and a  half tablets 2 times daily on Monday 04/06/14. Patient taking differently: Take 50 mg by mouth 2 (two) times daily.  04/04/14  Yes Elliot Cousin, MD  Multiple Vitamins-Minerals (MULTIVITAMIN WITH MINERALS) tablet Take 1 tablet by mouth daily.     Yes Historical Provider, MD  potassium chloride SA (K-DUR,KLOR-CON) 20 MEQ tablet TAKE 1 TABLET BY MOUTH ONCE DAILY 12/12/14  Yes Malissa Hippo, MD  predniSONE (DELTASONE) 10 MG tablet Take 10 mg by mouth daily with breakfast.   Yes Historical Provider, MD  spironolactone (ALDACTONE) 50 MG tablet TAKE 1 TABLET BY MOUTH DAILY 12/12/14  Yes Malissa Hippo, MD  Travoprost, BAK Free, (TRAVATAN) 0.004 % SOLN ophthalmic solution Place 1 drop into both eyes at bedtime. 09/20/11  Yes Elliot Cousin, MD  ursodiol (ACTIGALL) 300 MG capsule TAKE 1 CAPSULE 3 TIMES DAILY. 12/04/14  Yes Malissa Hippo, MD  vitamin B-12 (CYANOCOBALAMIN) 1000 MCG tablet Take 500 mcg by mouth daily.    Yes Historical Provider, MD  oxyCODONE-acetaminophen (PERCOCET/ROXICET) 5-325 MG tablet Take 1 tablet by mouth every 6 (six) hours as needed for severe pain. 01/31/15   Raeford Razor, MD   BP 104/65 mmHg  Pulse 95  Temp(Src) 98 F (36.7 C) (Oral)  Resp 16  Ht 5\' 5"  (1.651  m)  Wt 157 lb (71.215 kg)  BMI 26.13 kg/m2  SpO2 100% Physical Exam  Constitutional: She is oriented to person, place, and time. She appears well-developed and well-nourished. No distress.  HENT:  Head: Normocephalic and atraumatic.  Eyes: Conjunctivae are normal. Right eye exhibits no discharge. Left eye exhibits no discharge.  Neck: Neck supple.  Cardiovascular: Normal rate, regular rhythm and normal heart sounds.  Exam reveals no gallop and no friction rub.   No murmur heard. Pulmonary/Chest: Effort normal and breath sounds normal. No respiratory distress.  Abdominal: Soft. She exhibits no distension. There is no tenderness.  Musculoskeletal: She exhibits no edema or tenderness.  lower back is normal to inspection. Pain is not reproducible with palpation.  Neurological: She is alert and oriented to person, place, and time.  Reports increased pain with straight leg raising on either side.Strength is normal in both extremities. Sensation is intact to light touch. Vascularly intact.  Skin: Skin is warm and dry.  Psychiatric: She has a normal mood and affect. Her behavior is normal. Thought content normal.  Nursing note and vitals reviewed.   ED Course  Procedures (including critical care time) Labs Review Labs Reviewed - No data to display  Imaging Review No results found. I have personally reviewed and evaluated these images and lab results as part of my medical decision-making.   EKG Interpretation None      MDM   Final diagnoses:  Low back pain with left-sided sciatica, unspecified back pain laterality    82 year old female with lower back pain.I actually evaluated her back in October for very similar symptoms.She reports she had good relief with symptomatic treatment.No particularly concerning "red flags" aside from her advanced age. She denies any acute trauma. She is afebrile. She has a nonfocal neuro exam. Again she symptomatically with much improvement. She reports  appears to prescribe her small amount of Percocet to take as needed previously. We'll do this again but advised that she feels like she needs further medication that she should discuss withher PCP.    November, MD 02/04/15 [phone]

## 2015-02-08 NOTE — Progress Notes (Signed)
Eileen Courier, MD [address] [place] [address]  Anemia of chronic disease - Plan: CBC, Ferritin, CBC with Differential, Comprehensive metabolic panel, CANCELED: CBC with Differential, CANCELED: CBC with Differential, CANCELED: CBC with Differential, CANCELED: Comprehensive metabolic panel  Iron deficiency anemia due to chronic blood loss - Plan: CBC, Ferritin, CBC with Differential, Ferritin, CANCELED: CBC with Differential, CANCELED: Ferritin, CANCELED: CBC with Differential, CANCELED: Ferritin, CANCELED: CBC with Differential, CANCELED: Iron and TIBC, CANCELED: Ferritin  CURRENT THERAPY: Intermittent IV Ferahame PRN. ESA therapy  INTERVAL HISTORY: Eileen Santana 82 y.o. female returns for followup of anemia of chronic disease with rheumatoid arthritis, anemia of chronic renal disease, and iron deficiency with blood loss secondary to intestinal AV malformations, chronic kidney disease.   I personally reviewed and went over laboratory results with the patient.  The results are noted within this dictation. HGB is stable and WNL.  Chart reviewed.  Notes from Dr. Karilyn Cota are reviewed and noted.  She notes progressive fatigue and "I just can't do."  She reports that her fatigue has progressed over the past month.  She is fearful of falling and therefore has really limited her activities.  I remember when she was an active bowler and played many games, including bocce ball and shuffle board.  She does not perform these activities any longer.  She denies any falls.  Her appetite is fair.  Her weight is down 2 lbs over the past 4 months.     Past Medical History  Diagnosis Date  . History of GI bleed     Associated with NSAIDS  . Iron deficiency anemia     Transfusion dependent  . DJD (degenerative joint disease)   . Essential hypertension, benign   . History of colitis   . Ileitis   . Pancytopenia   . Autoimmune hepatitis (HCC)     With leukocytoclastic vasculitis    . Heart block AV second degree March 2013    a. Cardiology consult note 06/2013: "Question of previous second degree heart block, although review of cardiology notes indicates that this may have been actually blocked PACs when the patient was on verapamil."  . Bronchitis   . Steroid-induced myopathy   . Renal calculus 08/12/2011  . Rheumatoid arthritis(714.0)   . Colon ulcer 04/2010    NSAID related  . Gastric ulcer 04/2010    NSAID related  . UGI bleed 09/20/2011    Focal area of gastritis oozing of blood  . Gastric AVM 09/20/2011  . Candida esophagitis (HCC) 09/20/2011  . Paroxysmal atrial fibrillation (HCC)     Not on anticoag 2/2 hx of GIB/AVM  . CKD (chronic kidney disease), stage III   . Cholestatic hepatitis   . UTI (lower urinary tract infection)     history  . Paroxysmal atrial flutter (HCC)     Not on anticoag 2/2 hx of GIB/AVM - dx 06/2013.  . Multifocal atrial tachycardia (HCC)   . Hypertrophic cardiomyopathy (HCC)     Echo 03/2011: severe LVH suggesting possble infiltrate cardiomyopathy or advanced hypertensive heart disease, increased  echogenicity of the ventricular septum as well as the pericardium, EF >70% with end systolicmid-cavitary obliteration of the ventricle, stage 1 DD, mild MR, small IVC.  Marland Kitchen Hypomagnesemia   . Chronic renal disease, stage 3, moderately decreased glomerular filtration rate between 30-59 mL/min/1.73 square meter 02/03/2014  . Anemia of chronic disease 11/25/2011  . Anemia of chronic renal failure, stage  3 (moderate) 02/03/2014  . Low back pain   . 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 Biatrial enlargement 04/04/2014  . History of pneumonia 12/2013  . Atrial fibrillation [facility])     has Other pancytopenia (HCC); Sedimentation rate elevation; Elevated liver enzymes; GI bleed; Microcytic anemia; Orthostatic hypotension; Bradycardia; Renal failure; Autoimmune hepatitis (HCC); HTN (hypertension), benign; Abdominal pain, acute, generalized; Nausea; Diarrhea;  Hypomagnesemia; Hypokalemia; Prolonged Q-T interval on ECG; UTI (urinary tract infection); Gallstones; Thrombocytopenia (HCC); Renal calculus; Chronic back pain; Leg pain; Rheumatoid arthritis (HCC); Acute renal insufficiency; Hypoalbuminemia; Guaiac positive stools; UGI bleed; Gastric AVM; Candida esophagitis (HCC); Anemia of chronic disease; PAT (paroxysmal atrial tachycardia) (HCC); Hypertrophic cardiomyopathy (HCC); Hypersplenism; Iron deficiency anemia due to chronic blood loss; AVM (congenital arteriovenous malformation); Atrial tachycardia (HCC); Atrial flutter (HCC); Atrial fibrillation with RVR (HCC); Healthcare-associated pneumonia; COPD (chronic obstructive pulmonary disease) (HCC); HCAP (healthcare-associated pneumonia); Atrial fibrillation with rapid ventricular response (HCC); Atrial flutter with rapid ventricular response (HCC); Chronic renal disease, stage 3, moderately decreased glomerular filtration rate between 30-59 mL/min/1.73 square meter; Anemia of chronic renal failure, stage 3 (moderate); TIA (transient ischemic attack); Adrenal insufficiency (HCC); Aphasia; Stroke, acute, embolic (HCC); Biatrial enlargement; and Blood loss anemia on her problem list.     is allergic to iohexol; ivp dye; naprosyn; red blood cells; verapamil; vicodin; and sulfa antibiotics.  Current Outpatient Prescriptions on File Prior to Visit  Medication Sig Dispense Refill  . acetaminophen (TYLENOL) 325 MG tablet Take 650 mg by mouth every 6 (six) hours as needed. For pain    . aspirin 81 MG chewable tablet Chew 2 tablets (162 mg total) by mouth daily.    Marland Kitchen azaTHIOprine (IMURAN) 50 MG tablet TAKE 1 AND 1/2 TABLETS BY MOUTH ONCE DAILY 60 tablet 5  . cetirizine (ZYRTEC) 10 MG tablet Take 10 mg by mouth daily as needed for allergies.    . Cholecalciferol (VITAMIN D) 2000 UNITS tablet Take 2,000 Units by mouth daily.    . Coenzyme Q10 200 MG capsule Take 200 mg by mouth daily.    . Darbepoetin Alfa (ARANESP) 500  MCG/ML SOSY injection Inject 500 mcg into the skin every 21 ( twenty-one) days.    Marland Kitchen DEXILANT 60 MG capsule TAKE 1 CAPSULE BY MOUTH EVERY OTHER DAY 45 capsule 3  . diltiazem (TIAZAC) 360 MG 24 hr capsule Take 1 capsule (360 mg total) by mouth daily. 30 capsule 1  . fish oil-omega-3 fatty acids 1000 MG capsule Take 1 g by mouth daily.    . fluticasone (FLONASE) 50 MCG/ACT nasal spray Place 2 sprays into both nostrils daily as needed for allergies.     . folic acid (FOLVITE) 800 MCG tablet Take 800 mcg by mouth daily.     . furosemide (LASIX) 20 MG tablet Take 20 mg by mouth daily as needed. For fluid retention    . Hydrocodone-Acetaminophen 2.5-325 MG TABS Take 1 tablet by mouth 3 (three) times daily as needed. 45 tablet 0  . lidocaine-prilocaine (EMLA) cream Apply 1 application topically as needed (pain).     . Magnesium 400 MG TABS Take 1 tablet by mouth 2 (two) times daily.    . metoprolol (LOPRESSOR) 50 MG tablet Take 1 tablet 2 times daily on Saturday and Sunday and resume one and a half tablets 2 times daily on Monday 04/06/14. (Patient taking differently: Take 50 mg by mouth 2 (two) times daily. )    . Multiple Vitamins-Minerals (MULTIVITAMIN WITH MINERALS) tablet Take 1 tablet by  mouth daily.      Marland Kitchen oxyCODONE-acetaminophen (PERCOCET/ROXICET) 5-325 MG tablet Take 1 tablet by mouth every 6 (six) hours as needed for severe pain. 12 tablet 0  . potassium chloride SA (K-DUR,KLOR-CON) 20 MEQ tablet TAKE 1 TABLET BY MOUTH ONCE DAILY 30 tablet 2  . predniSONE (DELTASONE) 10 MG tablet Take 10 mg by mouth daily with breakfast.    . spironolactone (ALDACTONE) 50 MG tablet TAKE 1 TABLET BY MOUTH DAILY 30 tablet 2  . Travoprost, BAK Free, (TRAVATAN) 0.004 % SOLN ophthalmic solution Place 1 drop into both eyes at bedtime.    . ursodiol (ACTIGALL) 300 MG capsule TAKE 1 CAPSULE 3 TIMES DAILY. 90 capsule 5  . vitamin B-12 (CYANOCOBALAMIN) 1000 MCG tablet Take 500 mcg by mouth daily.      Current  Facility-Administered Medications on File Prior to Visit  Medication Dose Route Frequency Provider Last Rate Last Dose  . 0.9 %  sodium chloride infusion   Intravenous Continuous Ellouise Newer, PA-C   Stopped at 10/04/13 1230  . diphenhydrAMINE (BENADRYL) capsule 25 mg  25 mg Oral Q6H PRN Glenford Peers, MD   25 mg at 05/13/11 5427    Past Surgical History  Procedure Laterality Date  . Colonoscopy  04/2010  . Upper gastrointestinal endoscopy  04/2010  . Esophagogastroduodenoscopy  09/20/2011    Status post APC.  Marland Kitchen Esophagogastroduodenoscopy  09/20/2011    Procedure: ESOPHAGOGASTRODUODENOSCOPY (EGD);  Surgeon: Malissa Hippo, MD;  [facility];  Service: Endoscopy;  Laterality: N/A;  . Cyst removal hand      Elbow  . Sebaceous cyst removed      bilateral elbows  . Colonoscopy N/A 07/04/2014    Procedure: COLONOSCOPY;  Surgeon: Malissa Hippo, MD;  Location: [facility];  Service: Endoscopy;  Laterality: N/A;  730  . Esophagogastroduodenoscopy N/A 07/04/2014    Procedure: ESOPHAGOGASTRODUODENOSCOPY (EGD);  Surgeon: Malissa Hippo, MD;  [facility]: [facility];  Service: Endoscopy;  Laterality: N/A;  . Givens capsule study N/A 07/14/2014    Procedure: GIVENS CAPSULE STUDY;  Surgeon: Malissa Hippo, MD;  Location: [facility];  Service: Endoscopy;  Laterality: N/A;  730    Denies any headaches, dizziness, double vision, fevers, chills, night sweats, nausea, vomiting, diarrhea, constipation, chest pain, heart palpitations, shortness of breath, blood in stool, black tarry stool, urinary pain, urinary burning, urinary frequency, hematuria.   PHYSICAL EXAMINATION  ECOG PERFORMANCE STATUS: 2 - Symptomatic, <50% confined to bed  Filed Vitals:   02/09/15 1306  BP: 106/81  Temp: 98 F (36.7 C)  Resp: 18    GENERAL:alert, no distress, comfortable, cooperative and unaccompanied, in a wheelchair. SKIN: skin color, texture, turgor are normal, no rashes or significant  lesions HEAD: Normocephalic, No masses, lesions, tenderness or abnormalities EYES: normal, EOMI, Conjunctiva are pink and non-injected EARS: External ears normal OROPHARYNX:lips, buccal mucosa, and tongue normal and mucous membranes are moist  NECK: supple, no adenopathy, trachea midline LYMPH:  no palpable lymphadenopathy BREAST:not examined LUNGS: clear to auscultation  HEART: irregularly irregular ABDOMEN:abdomen soft and normal bowel sounds BACK: Back symmetric, no curvature. EXTREMITIES:less then 2 second capillary refill, no joint deformities, effusion, or inflammation, no skin discoloration, positive findings:  edema in ankles and feet B/L, R >> L, 1+ pitting, without erythema, heat, or pain.  NEURO: alert & oriented x 3 with fluent speech, no focal motor/sensory deficits    LABORATORY DATA: CBC    Component Value Date/Time   WBC  6.8 02/09/2015 1212   RBC 4.79 02/09/2015 1212   RBC 3.62* 03/16/2012 0937   HGB 13.6 02/09/2015 1212   HCT 40.6 02/09/2015 1212   PLT 67* 02/09/2015 1212   MCV 84.8 02/09/2015 1212   MCH 28.4 02/09/2015 1212   MCHC 33.5 02/09/2015 1212   RDW 22.3* 02/09/2015 1212   LYMPHSABS 0.5* 01/27/2015 1312   MONOABS 0.5 01/27/2015 1312   EOSABS 0.1 01/27/2015 1312   BASOSABS 0.0 01/27/2015 1312      Chemistry      Component Value Date/Time   NA 134* 01/27/2015 1319   K 4.0 01/27/2015 1319   CL 102 01/27/2015 1319   CO2 25 01/27/2015 1319   BUN 24* 01/27/2015 1319   CREATININE 1.50* 01/27/2015 1319   CREATININE 1.37* 03/18/2014 1026      Component Value Date/Time   CALCIUM 10.2 01/27/2015 1319   ALKPHOS 52 01/27/2015 1319   AST 380* 01/27/2015 1319   ALT 17 01/27/2015 1319   BILITOT 1.2 01/27/2015 1319      Lab Results  Component Value Date   IRON 25* 09/24/2013   TIBC 325 09/24/2013   FERRITIN 273 01/12/2015      PENDING LABS:   RADIOGRAPHIC STUDIES:  Dg Lumbar Spine Complete  01/31/2015  CLINICAL DATA:  Chronic low back  pain worsening over the last week. Bilateral leg weakness. EXAM: LUMBAR SPINE - COMPLETE 4+ VIEW COMPARISON:  03/27/2014 FINDINGS: Degenerative disc disease with disc space narrowing and spurring throughout the lumbar spine, most pronounced at L4-5 and L5-S1. Diffuse degenerative facet disease. Diffuse osteopenia. No fracture or subluxation. IMPRESSION: Degenerative disc and facet disease, stable. Osteopenia. No acute findings. Electronically Signed   By: Charlett Nose M.D.   On: 01/31/2015 16:18     PATHOLOGY:    ASSESSMENT AND PLAN:  Anemia of chronic disease Multifactorial, anemia of chronic disease with rheumatoid arthritis, anemia of chronic renal disease, and iron deficiency with blood loss secondary to intestinal AV malformations, chronic kidney disease.  Requiring intermittent IV iron.  Will maintain a ferritin of 100 or greater with IV iron replacement.  Also on ESA therapy.  Aranesp 100 mcg last given on 12/02/2015 as her HGB has been excellent.  No Aranesp today for her HGB > 11 g/dL.  Given her stability of HGB, it is reasonable to decrease the frequency of her lab appointments.  However, she is hesitant to do so.  Therefore, we will continue with labs every 2 weeks and Aranesp as indicated every 2 weeks.  Labs every 2 weeks: CBC dff  Labs every 4 weeks for iron deficiency anemia: ferritin  Labs in 3 months: CBC diff, CMET, ferritin.  Return in 3 months for follow-up.  If counts demonstrate signs of progressive cytopenias, will need to consider a repeat bone marrow aspiration and biopsy.   THERAPY PLAN:  Continue to monitor counts with continuation of count support with IV iron for ferritin less than 100 and continuation and manipulation of ESA therapy as indicated.  All questions were answered. The patient knows to call the clinic with any problems, questions or concerns. We can certainly see the patient much sooner if necessary.  Patient and plan discussed with Dr. Loma Messing and she is in agreement with the aforementioned.   25 minutes was spent in face to face discussion with the patient and 35 minutes total on today's encounter.  This note is electronically signed by: Tina Griffiths 02/09/2015 5:53 PM

## 2015-02-08 NOTE — Assessment & Plan Note (Addendum)
Multifactorial, anemia of chronic disease with rheumatoid arthritis, anemia of chronic renal disease, and iron deficiency with blood loss secondary to intestinal AV malformations, chronic kidney disease.  Requiring intermittent IV iron.  Will maintain a ferritin of 100 or greater with IV iron replacement.  Also on ESA therapy.  Aranesp 100 mcg last given on 12/02/2015 as her HGB has been excellent.  No Aranesp today for her HGB > 11 g/dL.  Given her stability of HGB, it is reasonable to decrease the frequency of her lab appointments.  However, she is hesitant to do so.  Therefore, we will continue with labs every 2 weeks and Aranesp as indicated every 2 weeks.  Labs every 2 weeks: CBC dff  Labs every 4 weeks for iron deficiency anemia: ferritin  Labs in 3 months: CBC diff, CMET, ferritin.  Return in 3 months for follow-up.  If counts demonstrate signs of progressive cytopenias, will need to consider a repeat bone marrow aspiration and biopsy.

## 2015-02-09 ENCOUNTER — Encounter (HOSPITAL_COMMUNITY): Payer: Medicare Other | Attending: Oncology | Admitting: Oncology

## 2015-02-09 ENCOUNTER — Encounter (HOSPITAL_COMMUNITY): Payer: Medicare Other

## 2015-02-09 ENCOUNTER — Encounter (HOSPITAL_COMMUNITY): Payer: Self-pay | Admitting: Oncology

## 2015-02-09 VITALS — BP 106/81 | Temp 98.0°F | Resp 18 | Wt 154.4 lb

## 2015-02-09 DIAGNOSIS — I129 Hypertensive chronic kidney disease with stage 1 through stage 4 chronic kidney disease, or unspecified chronic kidney disease: Secondary | ICD-10-CM | POA: Diagnosis not present

## 2015-02-09 DIAGNOSIS — Z8701 Personal history of pneumonia (recurrent): Secondary | ICD-10-CM | POA: Insufficient documentation

## 2015-02-09 DIAGNOSIS — Z9889 Other specified postprocedural states: Secondary | ICD-10-CM | POA: Diagnosis not present

## 2015-02-09 DIAGNOSIS — Z8719 Personal history of other diseases of the digestive system: Secondary | ICD-10-CM | POA: Diagnosis not present

## 2015-02-09 DIAGNOSIS — Q2730 Arteriovenous malformation, site unspecified: Secondary | ICD-10-CM

## 2015-02-09 DIAGNOSIS — M545 Low back pain: Secondary | ICD-10-CM | POA: Insufficient documentation

## 2015-02-09 DIAGNOSIS — D638 Anemia in other chronic diseases classified elsewhere: Secondary | ICD-10-CM | POA: Insufficient documentation

## 2015-02-09 DIAGNOSIS — Z87442 Personal history of urinary calculi: Secondary | ICD-10-CM | POA: Insufficient documentation

## 2015-02-09 DIAGNOSIS — I422 Other hypertrophic cardiomyopathy: Secondary | ICD-10-CM | POA: Diagnosis not present

## 2015-02-09 DIAGNOSIS — I480 Paroxysmal atrial fibrillation: Secondary | ICD-10-CM | POA: Diagnosis not present

## 2015-02-09 DIAGNOSIS — Z79899 Other long term (current) drug therapy: Secondary | ICD-10-CM | POA: Insufficient documentation

## 2015-02-09 DIAGNOSIS — M069 Rheumatoid arthritis, unspecified: Secondary | ICD-10-CM | POA: Insufficient documentation

## 2015-02-09 DIAGNOSIS — Z8673 Personal history of transient ischemic attack (TIA), and cerebral infarction without residual deficits: Secondary | ICD-10-CM | POA: Diagnosis not present

## 2015-02-09 DIAGNOSIS — D500 Iron deficiency anemia secondary to blood loss (chronic): Secondary | ICD-10-CM | POA: Insufficient documentation

## 2015-02-09 DIAGNOSIS — Z882 Allergy status to sulfonamides status: Secondary | ICD-10-CM | POA: Diagnosis not present

## 2015-02-09 DIAGNOSIS — Z7982 Long term (current) use of aspirin: Secondary | ICD-10-CM | POA: Diagnosis not present

## 2015-02-09 DIAGNOSIS — N183 Chronic kidney disease, stage 3 (moderate): Secondary | ICD-10-CM | POA: Insufficient documentation

## 2015-02-09 DIAGNOSIS — D631 Anemia in chronic kidney disease: Secondary | ICD-10-CM | POA: Insufficient documentation

## 2015-02-09 DIAGNOSIS — N189 Chronic kidney disease, unspecified: Secondary | ICD-10-CM | POA: Diagnosis not present

## 2015-02-09 LAB — CBC
HCT: 40.6 % (ref 36.0–46.0)
Hemoglobin: 13.6 g/dL (ref 12.0–15.0)
MCH: 28.4 pg (ref 26.0–34.0)
MCHC: 33.5 g/dL (ref 30.0–36.0)
MCV: 84.8 fL (ref 78.0–100.0)
PLATELETS: 67 10*3/uL — AB (ref 150–400)
RBC: 4.79 MIL/uL (ref 3.87–5.11)
RDW: 22.3 % — ABNORMAL HIGH (ref 11.5–15.5)
WBC: 6.8 10*3/uL (ref 4.0–10.5)

## 2015-02-09 LAB — FERRITIN: Ferritin: 82 ng/mL (ref 11–307)

## 2015-02-09 NOTE — Patient Instructions (Signed)
..  [facility] at [facility] Discharge Instructions  RECOMMENDATIONS MADE BY THE CONSULTANT AND ANY TEST RESULTS WILL BE SENT TO YOUR REFERRING PHYSICIAN.  Labs every 2 weeks Aranesp every 2 weeks if needed Return every 12 weeks to see Dr. Galen Manila   Thank you for choosing [facility] at [facility] to provide your oncology and hematology care.  To afford each patient quality time with our provider, please arrive at least 15 minutes before your scheduled appointment time.   Beginning January 23rd 2017 lab work for the [facility] will be done in the  Main lab at [facility] on 1st floor. If you have a lab appointment with the Cancer Center please come in thru the  Main Entrance and check in at the main information desk  You need to re-schedule your appointment should you arrive 10 or more minutes late.  We strive to give you quality time with our providers, and arriving late affects you and other patients whose appointments are after yours.  Also, if you no show three or more times for appointments you may be dismissed from the clinic at the providers discretion.     Again, thank you for choosing [facility].  Our hope is that these requests will decrease the amount of time that you wait before being seen by our physicians.       _____________________________________________________________  Should you have questions after your visit to [facility], please contact our office at [phone] between the hours of 8:30 a.m. and 4:30 p.m.  Voicemails left after 4:30 p.m. will not be returned until the following business day.  For prescription refill requests, have your pharmacy contact our office.

## 2015-02-09 NOTE — Progress Notes (Signed)
Hemoglobin 13.6. Aranesp not given, treatment parameters not met.

## 2015-02-10 ENCOUNTER — Other Ambulatory Visit (HOSPITAL_COMMUNITY): Payer: Self-pay | Admitting: Oncology

## 2015-02-10 ENCOUNTER — Telehealth (INDEPENDENT_AMBULATORY_CARE_PROVIDER_SITE_OTHER): Payer: Self-pay | Admitting: Internal Medicine

## 2015-02-10 DIAGNOSIS — D500 Iron deficiency anemia secondary to blood loss (chronic): Secondary | ICD-10-CM

## 2015-02-10 NOTE — Telephone Encounter (Signed)
[name], Ms. Babula's daughter called saying the patient will be out of Prednisone by Friday, February 10th, 2017. The bottle she currently has says no refills and she's wondering if a Rx can be sent to her pharmacy so she won't have to go without medication over the weekend. Please give her a call if needed.  Pt's ph# [phone] Thank you.

## 2015-02-11 NOTE — Telephone Encounter (Signed)
Per Dr. Karilyn Cota may call in Prednisone 5 mg tablets  - Patient is to take 2 by mouth every morning. 1 month with 3 refills. This was called to [facility]/April. Patient's daughter , [name] was made aware.

## 2015-02-17 ENCOUNTER — Encounter (HOSPITAL_BASED_OUTPATIENT_CLINIC_OR_DEPARTMENT_OTHER): Payer: Medicare Other

## 2015-02-17 VITALS — BP 97/58 | HR 76 | Temp 97.8°F | Resp 16

## 2015-02-17 DIAGNOSIS — D500 Iron deficiency anemia secondary to blood loss (chronic): Secondary | ICD-10-CM

## 2015-02-17 MED ORDER — SODIUM CHLORIDE 0.9 % IV SOLN
125.0000 mg | Freq: Once | INTRAVENOUS | Status: AC
  Administered 2015-02-17: 125 mg via INTRAVENOUS
  Filled 2015-02-17: qty 10

## 2015-02-17 NOTE — Progress Notes (Signed)
Tolerated iron infusion well. Stable on discharge home with family via wheelchair. 

## 2015-02-17 NOTE — Patient Instructions (Signed)
[facility] at [facility] Discharge Instructions  RECOMMENDATIONS MADE BY THE CONSULTANT AND ANY TEST RESULTS WILL BE SENT TO YOUR REFERRING PHYSICIAN.  Ferric gluconate 125 mg iron infusion given as ordered. Return as scheduled.  Thank you for choosing [facility] at [facility] to provide your oncology and hematology care.  To afford each patient quality time with our provider, please arrive at least 15 minutes before your scheduled appointment time.   Beginning January 23rd 2017 lab work for the Cancer Center will be done in the  Main lab at [facility] on 1st floor. If you have a lab appointment with the Cancer Center please come in thru the  Main Entrance and check in at the main information desk  You need to re-schedule your appointment should you arrive 10 or more minutes late.  We strive to give you quality time with our providers, and arriving late affects you and other patients whose appointments are after yours.  Also, if you no show three or more times for appointments you may be dismissed from the clinic at the providers discretion.     Again, thank you for choosing  Cancer Center.  Our hope is that these requests will decrease the amount of time that you wait before being seen by our physicians.       _____________________________________________________________  Should you have questions after your visit to  Cancer Center, please contact our office at [phone] between the hours of 8:30 a.m. and 4:30 p.m.  Voicemails left after 4:30 p.m. will not be returned until the following business day.  For prescription refill requests, have your pharmacy contact our office.    

## 2015-02-23 ENCOUNTER — Encounter (HOSPITAL_COMMUNITY): Payer: Medicare Other

## 2015-02-23 ENCOUNTER — Other Ambulatory Visit (HOSPITAL_COMMUNITY)
Admission: RE | Admit: 2015-02-23 | Discharge: 2015-02-23 | Disposition: A | Discharge status: Home / Self Care | Payer: Medicare Other | Source: Ambulatory Visit | Attending: Internal Medicine | Admitting: Internal Medicine

## 2015-02-23 DIAGNOSIS — K754 Autoimmune hepatitis: Secondary | ICD-10-CM

## 2015-02-23 DIAGNOSIS — D638 Anemia in other chronic diseases classified elsewhere: Secondary | ICD-10-CM | POA: Diagnosis not present

## 2015-02-23 DIAGNOSIS — D500 Iron deficiency anemia secondary to blood loss (chronic): Secondary | ICD-10-CM

## 2015-02-23 LAB — CBC WITH DIFFERENTIAL/PLATELET
BASOS ABS: 0 10*3/uL (ref 0.0–0.1)
BASOS PCT: 1 %
EOS PCT: 4 %
Eosinophils Absolute: 0.1 10*3/uL (ref 0.0–0.7)
HEMATOCRIT: 39.5 % (ref 36.0–46.0)
HEMOGLOBIN: 13.5 g/dL (ref 12.0–15.0)
Lymphocytes Relative: 13 %
Lymphs Abs: 0.5 10*3/uL — ABNORMAL LOW (ref 0.7–4.0)
MCH: 28.8 pg (ref 26.0–34.0)
MCHC: 34.2 g/dL (ref 30.0–36.0)
MCV: 84.4 fL (ref 78.0–100.0)
MONO ABS: 0.6 10*3/uL (ref 0.1–1.0)
Monocytes Relative: 17 %
Neutro Abs: 2.4 10*3/uL (ref 1.7–7.7)
Neutrophils Relative %: 65 %
Platelets: 63 10*3/uL — ABNORMAL LOW (ref 150–400)
RBC: 4.68 MIL/uL (ref 3.87–5.11)
RDW: 22.5 % — ABNORMAL HIGH (ref 11.5–15.5)
WBC: 3.7 10*3/uL — ABNORMAL LOW (ref 4.0–10.5)

## 2015-02-23 LAB — BASIC METABOLIC PANEL
Anion gap: 7 (ref 5–15)
BUN: 22 mg/dL — AB (ref 6–20)
CHLORIDE: 103 mmol/L (ref 101–111)
CO2: 26 mmol/L (ref 22–32)
CREATININE: 1.43 mg/dL — AB (ref 0.44–1.00)
Calcium: 9.9 mg/dL (ref 8.9–10.3)
GFR calc Af Amer: 38 mL/min — ABNORMAL LOW (ref >60)
GFR calc non Af Amer: 33 mL/min — ABNORMAL LOW (ref >60)
GLUCOSE: 171 mg/dL — AB (ref 65–99)
POTASSIUM: 3.8 mmol/L (ref 3.5–5.1)
SODIUM: 136 mmol/L (ref 135–145)

## 2015-02-23 NOTE — Progress Notes (Signed)
Patient didn't need injection today r/t lab results and treatment parameters.  Per Dellis Anes, PA and Dr. Galen Manila the patient is fine to go to three week intervals for recheck of labs.  The patient was informed of this and agreed that this would give her a break from so many MD appointments.  She was given her schedule for follow up as well as a copy of her labs for MD appointments this week at other offices.

## 2015-02-25 ENCOUNTER — Other Ambulatory Visit (INDEPENDENT_AMBULATORY_CARE_PROVIDER_SITE_OTHER): Payer: Self-pay | Admitting: Internal Medicine

## 2015-02-26 ENCOUNTER — Telehealth (INDEPENDENT_AMBULATORY_CARE_PROVIDER_SITE_OTHER): Payer: Self-pay | Admitting: Internal Medicine

## 2015-02-26 ENCOUNTER — Telehealth (INDEPENDENT_AMBULATORY_CARE_PROVIDER_SITE_OTHER): Payer: Self-pay | Admitting: *Deleted

## 2015-02-26 DIAGNOSIS — K754 Autoimmune hepatitis: Secondary | ICD-10-CM

## 2015-02-26 NOTE — Telephone Encounter (Signed)
[name] daughter, [name], stopped by with an empty bottle of Spironolactone saying she was told the patient was out of the medication (she took her last pill today). When I called [facility], I was told they received the refill request on yesterday and they'll send it out to be mailed to [name] along with her other medications on March 2nd. I was told the current Rx was filled on February 2nd and she should have pills left because she's instructed to take one pill per day.  Please contact [name], [name]'s other daughter who takes care of her medication in regards to this information.  [name]'s ph# [phone] Thank you.

## 2015-02-26 NOTE — Telephone Encounter (Signed)
Per Dr.Rehman the patient will need to have labs drawn in 4 weeks.   

## 2015-03-02 ENCOUNTER — Other Ambulatory Visit (INDEPENDENT_AMBULATORY_CARE_PROVIDER_SITE_OTHER): Payer: Self-pay | Admitting: *Deleted

## 2015-03-02 DIAGNOSIS — K754 Autoimmune hepatitis: Secondary | ICD-10-CM

## 2015-03-02 NOTE — Telephone Encounter (Signed)
Refill was sent to patient's pharmacy via fax. Pharmacy should contact the patient.

## 2015-03-05 ENCOUNTER — Other Ambulatory Visit (INDEPENDENT_AMBULATORY_CARE_PROVIDER_SITE_OTHER): Payer: Self-pay | Admitting: Internal Medicine

## 2015-03-13 ENCOUNTER — Emergency Department (HOSPITAL_COMMUNITY)
Admission: EM | Admit: 2015-03-13 | Discharge: 2015-03-13 | Disposition: A | Discharge status: Home / Self Care | Payer: Medicare Other | Attending: Emergency Medicine | Admitting: Emergency Medicine

## 2015-03-13 ENCOUNTER — Emergency Department (HOSPITAL_COMMUNITY): Payer: Medicare Other

## 2015-03-13 ENCOUNTER — Encounter (HOSPITAL_COMMUNITY): Payer: Self-pay | Admitting: Emergency Medicine

## 2015-03-13 DIAGNOSIS — I129 Hypertensive chronic kidney disease with stage 1 through stage 4 chronic kidney disease, or unspecified chronic kidney disease: Secondary | ICD-10-CM | POA: Diagnosis not present

## 2015-03-13 DIAGNOSIS — R269 Unspecified abnormalities of gait and mobility: Secondary | ICD-10-CM | POA: Insufficient documentation

## 2015-03-13 DIAGNOSIS — J810 Acute pulmonary edema: Secondary | ICD-10-CM

## 2015-03-13 DIAGNOSIS — R531 Weakness: Secondary | ICD-10-CM

## 2015-03-13 DIAGNOSIS — M069 Rheumatoid arthritis, unspecified: Secondary | ICD-10-CM | POA: Diagnosis not present

## 2015-03-13 DIAGNOSIS — I4891 Unspecified atrial fibrillation: Secondary | ICD-10-CM | POA: Insufficient documentation

## 2015-03-13 DIAGNOSIS — Z8701 Personal history of pneumonia (recurrent): Secondary | ICD-10-CM | POA: Diagnosis not present

## 2015-03-13 DIAGNOSIS — R05 Cough: Secondary | ICD-10-CM | POA: Diagnosis not present

## 2015-03-13 DIAGNOSIS — M6281 Muscle weakness (generalized): Secondary | ICD-10-CM | POA: Diagnosis present

## 2015-03-13 DIAGNOSIS — N189 Chronic kidney disease, unspecified: Secondary | ICD-10-CM | POA: Insufficient documentation

## 2015-03-13 DIAGNOSIS — F17210 Nicotine dependence, cigarettes, uncomplicated: Secondary | ICD-10-CM | POA: Diagnosis not present

## 2015-03-13 DIAGNOSIS — Z7982 Long term (current) use of aspirin: Secondary | ICD-10-CM | POA: Diagnosis not present

## 2015-03-13 LAB — BASIC METABOLIC PANEL
Anion gap: 10 (ref 5–15)
BUN: 17 mg/dL (ref 6–20)
CALCIUM: 10.1 mg/dL (ref 8.9–10.3)
CHLORIDE: 100 mmol/L — AB (ref 101–111)
CO2: 25 mmol/L (ref 22–32)
CREATININE: 1.24 mg/dL — AB (ref 0.44–1.00)
GFR calc Af Amer: 46 mL/min — ABNORMAL LOW (ref >60)
GFR calc non Af Amer: 39 mL/min — ABNORMAL LOW (ref >60)
GLUCOSE: 182 mg/dL — AB (ref 65–99)
Potassium: 4 mmol/L (ref 3.5–5.1)
Sodium: 135 mmol/L (ref 135–145)

## 2015-03-13 LAB — CBC
HCT: 39.7 % (ref 36.0–46.0)
Hemoglobin: 13.6 g/dL (ref 12.0–15.0)
MCH: 28.8 pg (ref 26.0–34.0)
MCHC: 34.3 g/dL (ref 30.0–36.0)
MCV: 83.9 fL (ref 78.0–100.0)
PLATELETS: 72 10*3/uL — AB (ref 150–400)
RBC: 4.73 MIL/uL (ref 3.87–5.11)
RDW: 21.1 % — ABNORMAL HIGH (ref 11.5–15.5)
WBC: 4.7 10*3/uL (ref 4.0–10.5)

## 2015-03-13 LAB — URINALYSIS, ROUTINE W REFLEX MICROSCOPIC
GLUCOSE, UA: NEGATIVE mg/dL
KETONES UR: NEGATIVE mg/dL
Leukocytes, UA: NEGATIVE
Nitrite: NEGATIVE
PH: 6 (ref 5.0–8.0)
Protein, ur: NEGATIVE mg/dL
Specific Gravity, Urine: 1.015 (ref 1.005–1.030)

## 2015-03-13 LAB — URINE MICROSCOPIC-ADD ON

## 2015-03-13 LAB — LACTIC ACID, PLASMA
Lactic Acid, Venous: 2.7 mmol/L (ref 0.5–2.0)
Lactic Acid, Venous: 2.9 mmol/L (ref 0.5–2.0)

## 2015-03-13 LAB — BRAIN NATRIURETIC PEPTIDE: B Natriuretic Peptide: 426 pg/mL — ABNORMAL HIGH (ref 0.0–100.0)

## 2015-03-13 LAB — TROPONIN I: TROPONIN I: 0.03 ng/mL (ref <0.031)

## 2015-03-13 MED ORDER — FUROSEMIDE 40 MG PO TABS
20.0000 mg | ORAL_TABLET | Freq: Once | ORAL | Status: AC
  Administered 2015-03-13: 20 mg via ORAL
  Filled 2015-03-13: qty 1

## 2015-03-13 NOTE — ED Notes (Signed)
CRITICAL VALUE ALERT  Critical value received:  Lactic Acid 2.9  Date of notification:  03/13/15  Time of notification:  1821  Critical value read back:Yes.    Nurse who received alert:  Dorris Fetch, RN  MD notified (1st page):  Dr Jodi Mourning  Time of first page:  1822

## 2015-03-13 NOTE — ED Notes (Signed)
Room air pulse ox 89-91%.  Placed on nasal cannula at 2 liters.

## 2015-03-13 NOTE — ED Notes (Signed)
Lactic acid critical result given to Dr Rubin Payor.

## 2015-03-13 NOTE — ED Provider Notes (Signed)
CSN: [id number]     Arrival date & time 03/13/15  1225 History   First MD Initiated Contact with Patient 03/13/15 1637     Chief Complaint  Patient presents with  . Extremity Weakness     (Consider location/radiation/quality/duration/timing/severity/associated sxs/prior Treatment) HPI Comments: 82 year old female with chronic back pain, sciatica, multiple blood transfusions, autoimmune hepatitis, uses walker and cane at baseline with family support, GI bleed, atrial tachycardia, pneumonia, stroke, aphasia presents after general weakness episode causing her legs to give out. Patient had no significant impact/fall she felt her legs are both weak and gave way. No head injury. No new headaches no fevers no chills. Very mild cough. Patient denies blood in the stools recently. No fevers or chills. No urinary symptoms. No unilateral symptoms.  Patient is a 82 y.o. female presenting with extremity weakness. The history is provided by the patient, a relative and medical records.  Extremity Weakness Pertinent negatives include no chest pain, no abdominal pain, no headaches and no shortness of breath.    Past Medical History  Diagnosis Date  . History of GI bleed     Associated with NSAIDS  . Iron deficiency anemia     Transfusion dependent  . DJD (degenerative joint disease)   . Essential hypertension, benign   . History of colitis   . Ileitis   . Pancytopenia   . Autoimmune hepatitis (HCC)     With leukocytoclastic vasculitis  . Heart block AV second degree March 2013    a. Cardiology consult note 06/2013: "Question of previous second degree heart block, although review of cardiology notes indicates that this may have been actually blocked PACs when the patient was on verapamil."  . Bronchitis   . Steroid-induced myopathy   . Renal calculus 08/12/2011  . Rheumatoid arthritis(714.0)   . Colon ulcer 04/2010    NSAID related  . Gastric ulcer 04/2010    NSAID related  . UGI bleed 09/20/2011   Focal area of gastritis oozing of blood  . Gastric AVM 09/20/2011  . Candida esophagitis (HCC) 09/20/2011  . Paroxysmal atrial fibrillation (HCC)     Not on anticoag 2/2 hx of GIB/AVM  . CKD (chronic kidney disease), stage III   . Cholestatic hepatitis   . UTI (lower urinary tract infection)     history  . Paroxysmal atrial flutter (HCC)     Not on anticoag 2/2 hx of GIB/AVM - dx 06/2013.  . Multifocal atrial tachycardia (HCC)   . Hypertrophic cardiomyopathy (HCC)     Echo 03/2011: severe LVH suggesting possble infiltrate cardiomyopathy or advanced hypertensive heart disease, increased  echogenicity of the ventricular septum as well as the pericardium, EF >70% with end systolicmid-cavitary obliteration of the ventricle, stage 1 DD, mild MR, small IVC.  Marland Kitchen Hypomagnesemia   . Chronic renal disease, stage 3, moderately decreased glomerular filtration rate between 30-59 mL/min/1.73 square meter 02/03/2014  . Anemia of chronic disease 11/25/2011  . Anemia of chronic renal failure, stage 3 (moderate) 02/03/2014  . Low back pain   . 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 Biatrial enlargement 04/04/2014  . History of pneumonia 12/2013  . Atrial fibrillation [facility])    Past Surgical History  Procedure Laterality Date  . Colonoscopy  04/2010  . Upper gastrointestinal endoscopy  04/2010  . Esophagogastroduodenoscopy  09/20/2011    Status post APC.  Marland Kitchen Esophagogastroduodenoscopy  09/20/2011    Procedure: ESOPHAGOGASTRODUODENOSCOPY (EGD);  Surgeon: Malissa Hippo, MD;  Location: [facility];  Service:  Endoscopy;  Laterality: N/A;  . Cyst removal hand      Elbow  . Sebaceous cyst removed      bilateral elbows  . Colonoscopy N/A 07/04/2014    Procedure: COLONOSCOPY;  Surgeon: Malissa Hippo, MD;  Location: [facility];  Service: Endoscopy;  Laterality: N/A;  730  . Esophagogastroduodenoscopy N/A 07/04/2014    Procedure: ESOPHAGOGASTRODUODENOSCOPY (EGD);  Surgeon: Malissa Hippo, MD;  Location: [facility];  Service: Endoscopy;  Laterality: N/A;  . Givens capsule study N/A 07/14/2014    Procedure: GIVENS CAPSULE STUDY;  Surgeon: Malissa Hippo, MD;  Location: [facility];  Service: Endoscopy;  Laterality: N/A;  730   Family History  Problem Relation Age of Onset  . Stroke Mother   . Hypertension Sister   . Hypertension Brother   . Heart disease Mother 72  . Heart disease Father 95  . COPD Brother   . Arthritis Brother   . Osteoporosis Sister    Social History  Substance Use Topics  . Smoking status: Current Some Day Smoker -- 0.25 packs/day for 50 years    Types: Cigarettes    Start date: 04/10/1945  . Smokeless tobacco: Never Used  . Alcohol Use: No   OB History    No data available     Review of Systems  Constitutional: Positive for appetite change. Negative for fever and chills.  HENT: Negative for congestion.   Eyes: Negative for visual disturbance.  Respiratory: Positive for cough. Negative for shortness of breath.   Cardiovascular: Negative for chest pain.  Gastrointestinal: Negative for vomiting and abdominal pain.  Genitourinary: Negative for dysuria and flank pain.  Musculoskeletal: Positive for back pain (chronic), gait problem and extremity weakness. Negative for neck pain and neck stiffness.  Skin: Negative for rash.  Neurological: Positive for weakness. Negative for syncope, light-headedness and headaches.      Allergies  Iohexol; Ivp dye; Naprosyn; Red blood cells; Verapamil; Vicodin; and Sulfa antibiotics  Home Medications   Prior to Admission medications   Medication Sig Start Date End Date Taking? Authorizing Provider  acetaminophen (TYLENOL) 325 MG tablet Take 650 mg by mouth every 6 (six) hours as needed. For pain    Historical Provider, MD  aspirin 81 MG chewable tablet Chew 2 tablets (162 mg total) by mouth daily. 04/04/14   Elliot Cousin, MD  azaTHIOprine (IMURAN) 50 MG tablet TAKE 1 AND 1/2 TABLETS BY MOUTH ONCE DAILY 03/06/15   Len Blalock, NP  cetirizine (ZYRTEC) 10 MG tablet Take 10 mg by mouth daily as needed for allergies.    Historical Provider, MD  Cholecalciferol (VITAMIN D) 2000 UNITS tablet Take 2,000 Units by mouth daily.    Historical Provider, MD  Coenzyme Q10 200 MG capsule Take 200 mg by mouth daily.    Historical Provider, MD  Darbepoetin Alfa (ARANESP) 500 MCG/ML SOSY injection Inject 500 mcg into the skin every 21 ( twenty-one) days.    Historical Provider, MD  DEXILANT 60 MG capsule TAKE 1 CAPSULE BY MOUTH EVERY OTHER DAY 10/21/14   Malissa Hippo, MD  diltiazem (TIAZAC) 360 MG 24 hr capsule Take 1 capsule (360 mg total) by mouth daily. 01/06/14   Hollice Espy, MD  fish oil-omega-3 fatty acids 1000 MG capsule Take 1 g by mouth daily.    Historical Provider, MD  fluticasone (FLONASE) 50 MCG/ACT nasal spray Place 2 sprays into both nostrils daily as needed for allergies.  03/26/14  Historical Provider, MD  folic acid (FOLVITE) 800 MCG tablet Take 800 mcg by mouth daily.     Historical Provider, MD  furosemide (LASIX) 20 MG tablet Take 20 mg by mouth daily as needed. For fluid retention 04/26/11   Malissa Hippo, MD  Hydrocodone-Acetaminophen 2.5-325 MG TABS Take 1 tablet by mouth 3 (three) times daily as needed. 09/22/14   Allene Pyo, MD  lidocaine-prilocaine (EMLA) cream Apply 1 application topically as needed (pain).  06/20/14   Historical Provider, MD  Magnesium 400 MG TABS Take 1 tablet by mouth 2 (two) times daily.    Historical Provider, MD  metoprolol (LOPRESSOR) 50 MG tablet Take 1 tablet 2 times daily on Saturday and Sunday and resume one and a half tablets 2 times daily on Monday 04/06/14. Patient taking differently: Take 50 mg by mouth 2 (two) times daily.  04/04/14   Elliot Cousin, MD  Multiple Vitamins-Minerals (MULTIVITAMIN WITH MINERALS) tablet Take 1 tablet by mouth daily.      Historical Provider, MD  oxyCODONE-acetaminophen (PERCOCET/ROXICET) 5-325 MG tablet Take 1 tablet by mouth every 6  (six) hours as needed for severe pain. 01/31/15   Raeford Razor, MD  potassium chloride SA (K-DUR,KLOR-CON) 20 MEQ tablet TAKE 1 TABLET BY MOUTH ONCE DAILY 12/12/14   Malissa Hippo, MD  predniSONE (DELTASONE) 10 MG tablet Take 10 mg by mouth daily with breakfast.    Historical Provider, MD  spironolactone (ALDACTONE) 50 MG tablet TAKE 1 TABLET BY MOUTH DAILY 02/25/15   Malissa Hippo, MD  Travoprost, BAK Free, (TRAVATAN) 0.004 % SOLN ophthalmic solution Place 1 drop into both eyes at bedtime. 09/20/11   Elliot Cousin, MD  ursodiol (ACTIGALL) 300 MG capsule TAKE 1 CAPSULE 3 TIMES DAILY. 12/04/14   Malissa Hippo, MD  vitamin B-12 (CYANOCOBALAMIN) 1000 MCG tablet Take 500 mcg by mouth daily.     Historical Provider, MD   BP 116/70 mmHg  Pulse 50  Temp(Src) 98.4 F (36.9 C) (Oral)  Resp 21  Ht 5\' 5"  (1.651 m)  Wt 153 lb (69.4 kg)  BMI 25.46 kg/m2  SpO2 97% Physical Exam  Constitutional: She is oriented to person, place, and time. She appears well-developed.  HENT:  Head: Normocephalic and atraumatic.  Eyes: Conjunctivae are normal. Right eye exhibits no discharge. Left eye exhibits no discharge.  Neck: Normal range of motion. Neck supple. No tracheal deviation present.  Cardiovascular: Normal rate and regular rhythm.   Pulmonary/Chest: Effort normal and breath sounds normal.  Abdominal: Soft. She exhibits no distension. There is no tenderness. There is no guarding.  Musculoskeletal: She exhibits no edema.  Neurological: She is alert and oriented to person, place, and time.  Gen. weakness. Patient has equal strength upper lower extremities bilateral no arm or leg drift. Sensation intact bilateral upper and lower extremities. Equal 1+ reflexes in lower extremities. Chronic at baseline patient has difficulty with horizontal gaze.  Skin: Skin is warm. No rash noted.  Psychiatric: She has a normal mood and affect.  Nursing note and vitals reviewed.   ED Course  Procedures (including  critical care time) Labs Review Labs Reviewed  BASIC METABOLIC PANEL - Abnormal; Notable for the following:    Chloride 100 (*)    Glucose, Bld 182 (*)    Creatinine, Ser 1.24 (*)    GFR calc non Af Amer 39 (*)    GFR calc Af Amer 46 (*)    All other components within normal limits  CBC -  Abnormal; Notable for the following:    RDW 21.1 (*)    Platelets 72 (*)    All other components within normal limits  URINALYSIS, ROUTINE W REFLEX MICROSCOPIC (NOT AT [facility]) - Abnormal; Notable for the following:    Hgb urine dipstick TRACE (*)    Bilirubin Urine SMALL (*)    All other components within normal limits  LACTIC ACID, PLASMA - Abnormal; Notable for the following:    Lactic Acid, Venous 2.7 (*)    All other components within normal limits  LACTIC ACID, PLASMA - Abnormal; Notable for the following:    Lactic Acid, Venous 2.9 (*)    All other components within normal limits  BRAIN NATRIURETIC PEPTIDE - Abnormal; Notable for the following:    B Natriuretic Peptide 426.0 (*)    All other components within normal limits  URINE MICROSCOPIC-ADD ON - Abnormal; Notable for the following:    Squamous Epithelial / LPF 0-5 (*)    Bacteria, UA FEW (*)    All other components within normal limits  URINE CULTURE  TROPONIN I    Imaging Review Dg Chest 2 View  03/13/2015  CLINICAL DATA:  Cough for 5 days.  Weakness. EXAM: CHEST - 2 VIEW COMPARISON:  Two-view chest x-ray 01/03/2014 FINDINGS: The heart is enlarged. Atherosclerotic changes are present at the aorta. Mild edema is present. Small bilateral pleural effusions are noted. Mild bibasilar airspace disease likely reflects atelectasis. Vascular calcifications are present in the splenic artery. IMPRESSION: 1. Cardiomegaly and mild edema suggesting congestive heart failure. 2. Small bilateral pleural effusions and associated atelectasis. Electronically Signed   By: Marin Roberts M.D.   On: 03/13/2015 14:01   Ct Head Wo Contrast  03/13/2015   CLINICAL DATA:  Legs are givnig out starting around 11 a.m. today causing patient fall backwards. EXAM: CT HEAD WITHOUT CONTRAST TECHNIQUE: Contiguous axial images were obtained from the base of the skull through the vertex without intravenous contrast. COMPARISON:  MRI 04/03/2014.  CT 04/02/2014. FINDINGS: There is no evidence for acute hemorrhage, hydrocephalus, mass lesion, or abnormal extra-axial fluid collection. No definite CT evidence for acute infarction. Diffuse loss of parenchymal volume is consistent with atrophy. The visualized paranasal sinuses and mastoid air cells are clear. IMPRESSION: 1. No acute intracranial abnormality. 2. Atrophy. Electronically Signed   By: Kennith Center M.D.   On: 03/13/2015 18:09   I have personally reviewed and evaluated these images and lab results as part of my medical decision-making.   EKG Interpretation None     EKG reviewed heart rate 104, atrial flutter, nonspecific findings, mild prolonged QT. MDM   Final diagnoses:  General weakness   Patient with multiple medical problems presents after episode of general weakness. No evidence of significant trauma on exam. Patient has evidence of mild pulmonary edema on chest x-ray in few crackles on lung exam however patient denies shortness of breath not require digoxin. Patient takes Lasix when necessary and ordered 1 dose here and discussed she will need couple more days of this. Plan for urinalysis and CT head to look for any other cause of her general weakness episode. Her blood work is overall at her baseline.  Mild pulmonary edema. Patient well-appearing on reassessment. Patient's workup unremarkable in the ER. Patient stable for close outpatient follow-up and will take Lasix as needed until she sees primary doctor. Lactate mild elevated however patient does not clinically appear septic no obvious source of infection on exam.  Results and differential diagnosis were  discussed with the  patient/parent/guardian. Xrays were independently reviewed by myself.  Close follow up outpatient was discussed, comfortable with the plan.   Medications  furosemide (LASIX) tablet 20 mg (20 mg Oral Given 03/13/15 1710)    Filed Vitals:   03/13/15 1730 03/13/15 1800 03/13/15 1830 03/13/15 1900  BP: 124/87 136/87 126/76 116/70  Pulse: 78 52 55 50  Temp:      TempSrc:      Resp:  21 22 21   Height:      Weight:      SpO2: 96% 98% 100% 97%    Final diagnoses:  General weakness       Blane Ohara, MD 03/13/15 [phone]

## 2015-03-13 NOTE — ED Notes (Signed)
PT c/o both legs "giving out" around 1100 today and she fell backwards into her recliner. PT denies any new pain at this time but states generalized weakness and cough x 5 days.

## 2015-03-13 NOTE — ED Notes (Signed)
Pt reports no [pain currently, reports leg weakness, is in a flutter on the monitor. Daughter is a former ICU RN who states that pt goes into many different rythmn changes. Pt is alert and conversant and desires to go home.

## 2015-03-13 NOTE — Discharge Instructions (Signed)
If you were given medicines take as directed.  If you are on coumadin or contraceptives realize their levels and effectiveness is altered by many different medicines.  If you have any reaction (rash, tongues swelling, other) to the medicines stop taking and see a physician.    If your blood pressure was elevated in the ER make sure you follow up for management with a primary doctor or return for chest pain, shortness of breath or stroke symptoms.  Please follow up as directed and return to the ER or see a physician for new or worsening symptoms.  Thank you. Filed Vitals:   03/13/15 1730 03/13/15 1800 03/13/15 1830 03/13/15 1900  BP: 124/87 136/87 126/76 116/70  Pulse: 78 52 55 50  Temp:      TempSrc:      Resp:  21 22 21   Height:      Weight:      SpO2: 96% 98% 100% 97%

## 2015-03-15 ENCOUNTER — Emergency Department (HOSPITAL_COMMUNITY): Payer: Medicare Other

## 2015-03-15 ENCOUNTER — Inpatient Hospital Stay (HOSPITAL_COMMUNITY): Payer: Medicare Other

## 2015-03-15 ENCOUNTER — Observation Stay (HOSPITAL_COMMUNITY)
Admission: EM | Admit: 2015-03-15 | Discharge: 2015-03-18 | Disposition: A | Discharge status: Home / Self Care | Payer: Medicare Other | Attending: Internal Medicine | Admitting: Internal Medicine

## 2015-03-15 ENCOUNTER — Encounter (HOSPITAL_COMMUNITY): Payer: Self-pay | Admitting: Emergency Medicine

## 2015-03-15 DIAGNOSIS — M4806 Spinal stenosis, lumbar region: Secondary | ICD-10-CM | POA: Diagnosis not present

## 2015-03-15 DIAGNOSIS — J449 Chronic obstructive pulmonary disease, unspecified: Secondary | ICD-10-CM | POA: Diagnosis not present

## 2015-03-15 DIAGNOSIS — W1789XA Other fall from one level to another, initial encounter: Secondary | ICD-10-CM | POA: Insufficient documentation

## 2015-03-15 DIAGNOSIS — M549 Dorsalgia, unspecified: Secondary | ICD-10-CM | POA: Diagnosis not present

## 2015-03-15 DIAGNOSIS — R531 Weakness: Secondary | ICD-10-CM | POA: Diagnosis present

## 2015-03-15 DIAGNOSIS — Z7951 Long term (current) use of inhaled steroids: Secondary | ICD-10-CM | POA: Insufficient documentation

## 2015-03-15 DIAGNOSIS — Y9389 Activity, other specified: Secondary | ICD-10-CM | POA: Insufficient documentation

## 2015-03-15 DIAGNOSIS — D61818 Other pancytopenia: Secondary | ICD-10-CM | POA: Insufficient documentation

## 2015-03-15 DIAGNOSIS — I1310 Hypertensive heart and chronic kidney disease without heart failure, with stage 1 through stage 4 chronic kidney disease, or unspecified chronic kidney disease: Secondary | ICD-10-CM | POA: Diagnosis not present

## 2015-03-15 DIAGNOSIS — Z888 Allergy status to other drugs, medicaments and biological substances status: Secondary | ICD-10-CM | POA: Diagnosis not present

## 2015-03-15 DIAGNOSIS — M5137 Other intervertebral disc degeneration, lumbosacral region: Secondary | ICD-10-CM | POA: Diagnosis not present

## 2015-03-15 DIAGNOSIS — N183 Chronic kidney disease, stage 3 unspecified: Secondary | ICD-10-CM | POA: Diagnosis present

## 2015-03-15 DIAGNOSIS — S3210XA Unspecified fracture of sacrum, initial encounter for closed fracture: Secondary | ICD-10-CM

## 2015-03-15 DIAGNOSIS — G8929 Other chronic pain: Secondary | ICD-10-CM | POA: Diagnosis not present

## 2015-03-15 DIAGNOSIS — I480 Paroxysmal atrial fibrillation: Secondary | ICD-10-CM | POA: Insufficient documentation

## 2015-03-15 DIAGNOSIS — R918 Other nonspecific abnormal finding of lung field: Secondary | ICD-10-CM | POA: Insufficient documentation

## 2015-03-15 DIAGNOSIS — K219 Gastro-esophageal reflux disease without esophagitis: Secondary | ICD-10-CM | POA: Insufficient documentation

## 2015-03-15 DIAGNOSIS — I10 Essential (primary) hypertension: Secondary | ICD-10-CM | POA: Diagnosis present

## 2015-03-15 DIAGNOSIS — M5126 Other intervertebral disc displacement, lumbar region: Secondary | ICD-10-CM | POA: Diagnosis not present

## 2015-03-15 DIAGNOSIS — Z886 Allergy status to analgesic agent status: Secondary | ICD-10-CM | POA: Diagnosis not present

## 2015-03-15 DIAGNOSIS — K754 Autoimmune hepatitis: Secondary | ICD-10-CM | POA: Diagnosis not present

## 2015-03-15 DIAGNOSIS — J438 Other emphysema: Secondary | ICD-10-CM | POA: Diagnosis not present

## 2015-03-15 DIAGNOSIS — Z91041 Radiographic dye allergy status: Secondary | ICD-10-CM | POA: Insufficient documentation

## 2015-03-15 DIAGNOSIS — I4892 Unspecified atrial flutter: Secondary | ICD-10-CM | POA: Insufficient documentation

## 2015-03-15 DIAGNOSIS — Z7982 Long term (current) use of aspirin: Secondary | ICD-10-CM | POA: Diagnosis not present

## 2015-03-15 DIAGNOSIS — N281 Cyst of kidney, acquired: Secondary | ICD-10-CM | POA: Diagnosis not present

## 2015-03-15 DIAGNOSIS — M069 Rheumatoid arthritis, unspecified: Secondary | ICD-10-CM | POA: Diagnosis not present

## 2015-03-15 DIAGNOSIS — R29898 Other symptoms and signs involving the musculoskeletal system: Secondary | ICD-10-CM | POA: Diagnosis present

## 2015-03-15 DIAGNOSIS — F17210 Nicotine dependence, cigarettes, uncomplicated: Secondary | ICD-10-CM | POA: Diagnosis not present

## 2015-03-15 DIAGNOSIS — D638 Anemia in other chronic diseases classified elsewhere: Secondary | ICD-10-CM | POA: Diagnosis not present

## 2015-03-15 DIAGNOSIS — I4891 Unspecified atrial fibrillation: Secondary | ICD-10-CM | POA: Diagnosis present

## 2015-03-15 DIAGNOSIS — Z885 Allergy status to narcotic agent status: Secondary | ICD-10-CM | POA: Insufficient documentation

## 2015-03-15 DIAGNOSIS — I700 Atherosclerosis of aorta: Secondary | ICD-10-CM | POA: Diagnosis not present

## 2015-03-15 DIAGNOSIS — Z79899 Other long term (current) drug therapy: Secondary | ICD-10-CM | POA: Diagnosis not present

## 2015-03-15 DIAGNOSIS — D696 Thrombocytopenia, unspecified: Secondary | ICD-10-CM | POA: Insufficient documentation

## 2015-03-15 DIAGNOSIS — M5136 Other intervertebral disc degeneration, lumbar region: Secondary | ICD-10-CM | POA: Insufficient documentation

## 2015-03-15 DIAGNOSIS — J90 Pleural effusion, not elsewhere classified: Secondary | ICD-10-CM | POA: Diagnosis not present

## 2015-03-15 DIAGNOSIS — Z8719 Personal history of other diseases of the digestive system: Secondary | ICD-10-CM | POA: Diagnosis not present

## 2015-03-15 DIAGNOSIS — I422 Other hypertrophic cardiomyopathy: Secondary | ICD-10-CM | POA: Insufficient documentation

## 2015-03-15 DIAGNOSIS — Z882 Allergy status to sulfonamides status: Secondary | ICD-10-CM | POA: Insufficient documentation

## 2015-03-15 DIAGNOSIS — S32139A Unspecified Zone III fracture of sacrum, initial encounter for closed fracture: Principal | ICD-10-CM | POA: Insufficient documentation

## 2015-03-15 DIAGNOSIS — R05 Cough: Secondary | ICD-10-CM | POA: Insufficient documentation

## 2015-03-15 DIAGNOSIS — R748 Abnormal levels of other serum enzymes: Secondary | ICD-10-CM | POA: Insufficient documentation

## 2015-03-15 DIAGNOSIS — K7589 Other specified inflammatory liver diseases: Secondary | ICD-10-CM | POA: Insufficient documentation

## 2015-03-15 DIAGNOSIS — Z66 Do not resuscitate: Secondary | ICD-10-CM | POA: Diagnosis not present

## 2015-03-15 LAB — COMPREHENSIVE METABOLIC PANEL
ALT: 21 U/L (ref 14–54)
AST: 283 U/L — ABNORMAL HIGH (ref 15–41)
Albumin: 2.5 g/dL — ABNORMAL LOW (ref 3.5–5.0)
Alkaline Phosphatase: 55 U/L (ref 38–126)
Anion gap: 8 (ref 5–15)
BUN: 21 mg/dL — ABNORMAL HIGH (ref 6–20)
CO2: 27 mmol/L (ref 22–32)
Calcium: 9.5 mg/dL (ref 8.9–10.3)
Chloride: 100 mmol/L — ABNORMAL LOW (ref 101–111)
Creatinine, Ser: 1.27 mg/dL — ABNORMAL HIGH (ref 0.44–1.00)
GFR calc Af Amer: 44 mL/min — ABNORMAL LOW (ref >60)
GFR calc non Af Amer: 38 mL/min — ABNORMAL LOW (ref >60)
Glucose, Bld: 147 mg/dL — ABNORMAL HIGH (ref 65–99)
Potassium: 4.1 mmol/L (ref 3.5–5.1)
Sodium: 135 mmol/L (ref 135–145)
Total Bilirubin: 1.7 mg/dL — ABNORMAL HIGH (ref 0.3–1.2)
Total Protein: 5.7 g/dL — ABNORMAL LOW (ref 6.5–8.1)

## 2015-03-15 LAB — CBC WITH DIFFERENTIAL/PLATELET
BASOS ABS: 0 10*3/uL (ref 0.0–0.1)
Basophils Relative: 1 %
Eosinophils Absolute: 0.1 10*3/uL (ref 0.0–0.7)
Eosinophils Relative: 2 %
HEMATOCRIT: 38.2 % (ref 36.0–46.0)
HEMOGLOBIN: 13.2 g/dL (ref 12.0–15.0)
Lymphocytes Relative: 9 %
Lymphs Abs: 0.4 10*3/uL — ABNORMAL LOW (ref 0.7–4.0)
MCH: 28.8 pg (ref 26.0–34.0)
MCHC: 34.6 g/dL (ref 30.0–36.0)
MCV: 83.2 fL (ref 78.0–100.0)
MONO ABS: 0.4 10*3/uL (ref 0.1–1.0)
Monocytes Relative: 9 %
NEUTROS ABS: 3.3 10*3/uL (ref 1.7–7.7)
Neutrophils Relative %: 81 %
Platelets: 63 10*3/uL — ABNORMAL LOW (ref 150–400)
RBC: 4.59 MIL/uL (ref 3.87–5.11)
RDW: 21 % — AB (ref 11.5–15.5)
WBC: 4.1 10*3/uL (ref 4.0–10.5)

## 2015-03-15 LAB — URINALYSIS, ROUTINE W REFLEX MICROSCOPIC
GLUCOSE, UA: NEGATIVE mg/dL
Leukocytes, UA: NEGATIVE
Nitrite: NEGATIVE
PH: 6.5 (ref 5.0–8.0)
Specific Gravity, Urine: 1.025 (ref 1.005–1.030)

## 2015-03-15 LAB — URINE CULTURE

## 2015-03-15 LAB — TSH: TSH: 0.88 u[IU]/mL (ref 0.350–4.500)

## 2015-03-15 LAB — URINE MICROSCOPIC-ADD ON: WBC, UA: NONE SEEN WBC/hpf (ref 0–5)

## 2015-03-15 LAB — AMMONIA: AMMONIA: 31 umol/L (ref 9–35)

## 2015-03-15 MED ORDER — LATANOPROST 0.005 % OP SOLN
1.0000 [drp] | Freq: Every day | OPHTHALMIC | Status: DC
  Administered 2015-03-15 – 2015-03-17 (×3): 1 [drp] via OPHTHALMIC
  Filled 2015-03-15: qty 2.5

## 2015-03-15 MED ORDER — PANTOPRAZOLE SODIUM 40 MG PO TBEC
40.0000 mg | DELAYED_RELEASE_TABLET | Freq: Every day | ORAL | Status: DC
  Administered 2015-03-15 – 2015-03-18 (×4): 40 mg via ORAL
  Filled 2015-03-15 (×4): qty 1

## 2015-03-15 MED ORDER — AZATHIOPRINE 50 MG PO TABS
75.0000 mg | ORAL_TABLET | Freq: Every day | ORAL | Status: DC
  Administered 2015-03-15 – 2015-03-18 (×4): 75 mg via ORAL
  Filled 2015-03-15 (×4): qty 2

## 2015-03-15 MED ORDER — SPIRONOLACTONE 50 MG PO TABS
50.0000 mg | ORAL_TABLET | Freq: Every day | ORAL | Status: DC
  Administered 2015-03-15 – 2015-03-18 (×4): 50 mg via ORAL
  Filled 2015-03-15 (×4): qty 1

## 2015-03-15 MED ORDER — MAGNESIUM OXIDE 400 (241.3 MG) MG PO TABS
400.0000 mg | ORAL_TABLET | Freq: Every day | ORAL | Status: DC
  Administered 2015-03-15 – 2015-03-18 (×4): 400 mg via ORAL
  Filled 2015-03-15 (×4): qty 1

## 2015-03-15 MED ORDER — BENZONATATE 100 MG PO CAPS
100.0000 mg | ORAL_CAPSULE | Freq: Three times a day (TID) | ORAL | Status: DC | PRN
  Administered 2015-03-16 – 2015-03-18 (×3): 100 mg via ORAL
  Filled 2015-03-15 (×3): qty 1

## 2015-03-15 MED ORDER — MAGNESIUM OXIDE 400 (241.3 MG) MG PO TABS: 400.0000 mg | ORAL_TABLET | Freq: Two times a day (BID) | ORAL | Status: DC

## 2015-03-15 MED ORDER — SODIUM CHLORIDE 0.9 % IV BOLUS (SEPSIS)
500.0000 mL | Freq: Once | INTRAVENOUS | Status: AC
  Administered 2015-03-15: 500 mL via INTRAVENOUS

## 2015-03-15 MED ORDER — ACETAMINOPHEN 325 MG PO TABS
650.0000 mg | ORAL_TABLET | Freq: Four times a day (QID) | ORAL | Status: DC | PRN
  Administered 2015-03-16: 650 mg via ORAL
  Filled 2015-03-15: qty 2

## 2015-03-15 MED ORDER — METOPROLOL TARTRATE 50 MG PO TABS
50.0000 mg | ORAL_TABLET | Freq: Two times a day (BID) | ORAL | Status: DC
  Administered 2015-03-15 – 2015-03-18 (×6): 50 mg via ORAL
  Filled 2015-03-15 (×6): qty 1

## 2015-03-15 MED ORDER — LIDOCAINE 5 % EX PTCH
1.0000 | MEDICATED_PATCH | CUTANEOUS | Status: DC
  Administered 2015-03-15 – 2015-03-17 (×3): 1 via TRANSDERMAL
  Filled 2015-03-15 (×4): qty 1

## 2015-03-15 MED ORDER — ACETAMINOPHEN 650 MG RE SUPP: 650.0000 mg | Freq: Four times a day (QID) | RECTAL | Status: DC | PRN

## 2015-03-15 MED ORDER — POTASSIUM CHLORIDE CRYS ER 20 MEQ PO TBCR
20.0000 meq | EXTENDED_RELEASE_TABLET | Freq: Every day | ORAL | Status: DC
  Administered 2015-03-15 – 2015-03-18 (×4): 20 meq via ORAL
  Filled 2015-03-15 (×4): qty 1

## 2015-03-15 MED ORDER — DOCUSATE SODIUM 100 MG PO CAPS
100.0000 mg | ORAL_CAPSULE | Freq: Two times a day (BID) | ORAL | Status: DC
  Administered 2015-03-15 – 2015-03-18 (×6): 100 mg via ORAL
  Filled 2015-03-15 (×6): qty 1

## 2015-03-15 MED ORDER — PREDNISONE 5 MG PO TABS
7.5000 mg | ORAL_TABLET | Freq: Every day | ORAL | Status: DC
  Administered 2015-03-15 – 2015-03-18 (×3): 7.5 mg via ORAL
  Filled 2015-03-15 (×5): qty 2

## 2015-03-15 MED ORDER — COENZYME Q10 200 MG PO CAPS: 200.0000 mg | ORAL_CAPSULE | Freq: Every day | ORAL | Status: DC

## 2015-03-15 MED ORDER — URSODIOL 300 MG PO CAPS
300.0000 mg | ORAL_CAPSULE | Freq: Two times a day (BID) | ORAL | Status: DC
  Administered 2015-03-15 – 2015-03-18 (×6): 300 mg via ORAL
  Filled 2015-03-15 (×9): qty 1

## 2015-03-15 MED ORDER — PREDNISONE 5 MG PO TABS: 7.5000 mg | ORAL_TABLET | Freq: Two times a day (BID) | ORAL | Status: DC

## 2015-03-15 MED ORDER — ONDANSETRON HCL 4 MG/2ML IJ SOLN
4.0000 mg | Freq: Once | INTRAMUSCULAR | Status: AC
  Administered 2015-03-15: 4 mg via INTRAVENOUS
  Filled 2015-03-15: qty 2

## 2015-03-15 MED ORDER — FOLIC ACID 1 MG PO TABS
1.0000 mg | ORAL_TABLET | Freq: Every day | ORAL | Status: DC
  Administered 2015-03-16 – 2015-03-18 (×3): 1 mg via ORAL
  Filled 2015-03-15 (×3): qty 1

## 2015-03-15 MED ORDER — DILTIAZEM HCL ER BEADS 240 MG PO CP24
360.0000 mg | ORAL_CAPSULE | Freq: Every day | ORAL | Status: DC
  Filled 2015-03-15: qty 1

## 2015-03-15 MED ORDER — ASPIRIN 81 MG PO CHEW
162.0000 mg | CHEWABLE_TABLET | Freq: Every day | ORAL | Status: DC
  Administered 2015-03-15 – 2015-03-18 (×4): 162 mg via ORAL
  Filled 2015-03-15 (×4): qty 2

## 2015-03-15 MED ORDER — MAGNESIUM 400 MG PO TABS: 1.0000 | ORAL_TABLET | Freq: Two times a day (BID) | ORAL | Status: DC

## 2015-03-15 MED ORDER — VITAMIN D 1000 UNITS PO TABS
2000.0000 [IU] | ORAL_TABLET | Freq: Every day | ORAL | Status: DC
  Administered 2015-03-16 – 2015-03-18 (×3): 2000 [IU] via ORAL
  Filled 2015-03-15 (×3): qty 2

## 2015-03-15 MED ORDER — FLUTICASONE PROPIONATE 50 MCG/ACT NA SUSP
2.0000 | Freq: Every day | NASAL | Status: DC | PRN
  Filled 2015-03-15: qty 16

## 2015-03-15 MED ORDER — MORPHINE SULFATE (PF) 2 MG/ML IV SOLN: 2.0000 mg | INTRAVENOUS | Status: DC | PRN

## 2015-03-15 MED ORDER — URSODIOL 300 MG PO CAPS
300.0000 mg | ORAL_CAPSULE | Freq: Three times a day (TID) | ORAL | Status: DC
  Filled 2015-03-15: qty 1

## 2015-03-15 MED ORDER — MORPHINE SULFATE (PF) 2 MG/ML IV SOLN
2.0000 mg | Freq: Once | INTRAVENOUS | Status: AC
  Administered 2015-03-15: 2 mg via INTRAVENOUS
  Filled 2015-03-15: qty 1

## 2015-03-15 NOTE — ED Notes (Signed)
Report given to [facility]. 

## 2015-03-15 NOTE — ED Notes (Addendum)
Patient c/o lower back pain that radiates into legs bilaterally. Patient has appointment with orthopedic in which she was referred to by rheumatologist. Patient has DDD per family. Denies any complication with BM or urination. Per patient nauseated from pain but does report some right lower quad pain. Patient has lidocaine patch with little relief.

## 2015-03-15 NOTE — Progress Notes (Signed)
Pt transfer to MRI, pt alert and oriented no s/s of distress noted.

## 2015-03-15 NOTE — H&P (Signed)
[facility] History and Physical  Kariah Loredo ION:[id number] DOB: [birth year] DOA: 03/15/2015  Referring physician: Ivery Quale, PA-C PCP: [name], MD   Chief Complaint: Back Pain   HPI: Eileen Santana is a 82 y.o. female with a hx of degenerative joint disease, HTN, gastric ulcers, CKD, autoimmune hepatitis, sciatica, and chronic back pain presents with bilateral LE weakness (R>L) and difficulty ambulating. Per family at bedside, over the last 2 days they have been noticing increasing weakness involving the BLE and inability to ambulate. Patient is relatively functional and ambulates with a cane however she now cannot walk any distance, even with support. She has also been having pain in her lower back that radiates down her BLE into her toes. She has been taking oral medications with minimal improvement.  Per daughters at bedside, she has been experiencing tingling in her BLE for the last 3 months but the weakness just began 2 days ago. She denies any numbness, incontinence, fever, chills, CP, SOB, LOC, nausea, vomiting, or abdominal pain. While in the ED, CT scan of the lumbar spine revealed no acute fracture but there is multilevel degenerative disc disease changes present. In particular at the L3-L4 area there is moderate central canal stenosis and severe right lateral recess stenosis at the L3-L4 area. There is also noted scoliosis present. CXR revealed mild CHF.  Labs were consistent with CKD and showed a platelet count of 63. She will be admitted for further management.    Review of Systems:  Constitutional:  No weight loss, night sweats, Fevers, chills, fatigue.  HEENT:  No headaches, Difficulty swallowing,Tooth/dental problems,Sore throat,  No sneezing, itching, ear ache, nasal congestion, post nasal drip,  Cardio-vascular:  No chest pain, Orthopnea, PND, swelling in lower extremities, anasarca, dizziness, palpitations  GI:  No heartburn, indigestion, abdominal pain,  nausea, vomiting, diarrhea, change in bowel habits, loss of appetite  Resp:  No shortness of breath with exertion or at rest. No excess mucus, no productive cough, No non-productive cough, No coughing up of blood.No change in color of mucus.No wheezing.No chest wall deformity  Skin:  no rash or lesions.  GU:  no dysuria, change in color of urine, no urgency or frequency. No flank pain.  Musculoskeletal:  LE weakness, tingling, and pain. Lower back pain  Psych:  No change in mood or affect. No depression or anxiety. No memory loss.   Past Medical History  Diagnosis Date  . History of GI bleed     Associated with NSAIDS  . Iron deficiency anemia     Transfusion dependent  . DJD (degenerative joint disease)   . Essential hypertension, benign   . History of colitis   . Ileitis   . Pancytopenia   . Autoimmune hepatitis (HCC)     With leukocytoclastic vasculitis  . Heart block AV second degree March 2013    a. Cardiology consult note 06/2013: "Question of previous second degree heart block, although review of cardiology notes indicates that this may have been actually blocked PACs when the patient was on verapamil."  . Bronchitis   . Steroid-induced myopathy   . Renal calculus 08/12/2011  . Rheumatoid arthritis(714.0)   . Colon ulcer 04/2010    NSAID related  . Gastric ulcer 04/2010    NSAID related  . UGI bleed 09/20/2011    Focal area of gastritis oozing of blood  . Gastric AVM 09/20/2011  . Candida esophagitis (HCC) 09/20/2011  . Paroxysmal atrial fibrillation (HCC)     Not on  anticoag 2/2 hx of GIB/AVM  . CKD (chronic kidney disease), stage III   . Cholestatic hepatitis   . UTI (lower urinary tract infection)     history  . Paroxysmal atrial flutter (HCC)     Not on anticoag 2/2 hx of GIB/AVM - dx 06/2013.  . Multifocal atrial tachycardia (HCC)   . Hypertrophic cardiomyopathy (HCC)     Echo 03/2011: severe LVH suggesting possble infiltrate cardiomyopathy or advanced hypertensive  heart disease, increased  echogenicity of the ventricular septum as well as the pericardium, EF >70% with end systolicmid-cavitary obliteration of the ventricle, stage 1 DD, mild MR, small IVC.  Marland Kitchen Hypomagnesemia   . Chronic renal disease, stage 3, moderately decreased glomerular filtration rate between 30-59 mL/min/1.73 square meter 02/03/2014  . Anemia of chronic disease 11/25/2011  . Anemia of chronic renal failure, stage 3 (moderate) 02/03/2014  . Low back pain   . 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 Biatrial enlargement 04/04/2014  . History of pneumonia 12/2013  . Atrial fibrillation [facility])    Past Surgical History  Procedure Laterality Date  . Colonoscopy  04/2010  . Upper gastrointestinal endoscopy  04/2010  . Esophagogastroduodenoscopy  09/20/2011    Status post APC.  Marland Kitchen Esophagogastroduodenoscopy  09/20/2011    Procedure: ESOPHAGOGASTRODUODENOSCOPY (EGD);  Surgeon: Malissa Hippo, MD;  Location: [facility];  Service: Endoscopy;  Laterality: N/A;  . Cyst removal hand      Elbow  . Sebaceous cyst removed      bilateral elbows  . Colonoscopy N/A 07/04/2014    Procedure: COLONOSCOPY;  Surgeon: Malissa Hippo, MD;  Location: [facility];  Service: Endoscopy;  Laterality: N/A;  730  . Esophagogastroduodenoscopy N/A 07/04/2014    Procedure: ESOPHAGOGASTRODUODENOSCOPY (EGD);  Surgeon: Malissa Hippo, MD;  Location: [facility];  Service: Endoscopy;  Laterality: N/A;  . Givens capsule study N/A 07/14/2014    Procedure: GIVENS CAPSULE STUDY;  Surgeon: Malissa Hippo, MD;  Location: [facility];  Service: Endoscopy;  Laterality: N/A;  730   Social History:  reports that she has been smoking Cigarettes.  She started smoking about 69 years ago. She has a 12.5 pack-year smoking history. She has never used smokeless tobacco. She reports that she does not drink alcohol or use illicit drugs.  Allergies  Allergen Reactions  . Iohexol Swelling    IV Dye   . Ivp Dye [Iodinated Diagnostic  [employer]] Swelling    Hives  . Naprosyn [Naproxen] Hives and Itching  . Red Blood Cells Swelling and Dermatitis    With blood transfusion 2012  . Verapamil     Heart block (2nd degree) Takes Cardizem   . Vicodin [Hydrocodone-Acetaminophen] Hives and Itching  . Sulfa Antibiotics Itching and Rash    Family History  Problem Relation Age of Onset  . Stroke Mother   . Hypertension Sister   . Hypertension Brother   . Heart disease Mother 23  . Heart disease Father 95  . COPD Brother   . Arthritis Brother   . Osteoporosis Sister      Prior to Admission medications   Medication Sig Start Date End Date Taking? Authorizing Provider  acetaminophen (TYLENOL) 325 MG tablet Take 650 mg by mouth every 6 (six) hours as needed. For pain   Yes Historical Provider, MD  aspirin 81 MG chewable tablet Chew 2 tablets (162 mg total) by mouth daily. 04/04/14  Yes Elliot Cousin, MD  azaTHIOprine (IMURAN) 50 MG tablet TAKE  1 AND 1/2 TABLETS BY MOUTH ONCE DAILY 03/06/15  Yes Len Blalock, NP  cetirizine (ZYRTEC) 10 MG tablet Take 10 mg by mouth daily as needed for allergies.   Yes Historical Provider, MD  Cholecalciferol (VITAMIN D) 2000 UNITS tablet Take 2,000 Units by mouth daily.   Yes Historical Provider, MD  Coenzyme Q10 200 MG capsule Take 200 mg by mouth daily.   Yes Historical Provider, MD  Darbepoetin Alfa (ARANESP) 500 MCG/ML SOSY injection Inject 500 mcg into the skin every 21 ( twenty-one) days.   Yes Historical Provider, MD  DEXILANT 60 MG capsule TAKE 1 CAPSULE BY MOUTH EVERY OTHER DAY 10/21/14  Yes Malissa Hippo, MD  diltiazem (TIAZAC) 360 MG 24 hr capsule Take 1 capsule (360 mg total) by mouth daily. 01/06/14  Yes Hollice Espy, MD  fish oil-omega-3 fatty acids 1000 MG capsule Take 1 g by mouth daily.   Yes Historical Provider, MD  fluticasone (FLONASE) 50 MCG/ACT nasal spray Place 2 sprays into both nostrils daily as needed for allergies.  03/26/14  Yes Historical Provider, MD  folic acid  (FOLVITE) 800 MCG tablet Take 800 mcg by mouth daily.    Yes Historical Provider, MD  furosemide (LASIX) 20 MG tablet Take 20 mg by mouth daily as needed. For fluid retention 04/26/11  Yes Malissa Hippo, MD  Hydrocodone-Acetaminophen 2.5-325 MG TABS Take 1 tablet by mouth 3 (three) times daily as needed. Patient taking differently: Take 1 tablet by mouth 3 (three) times daily as needed (pain).  09/22/14  Yes Allene Pyo, MD  lidocaine-prilocaine (EMLA) cream Apply 1 application topically as needed (pain).  06/20/14  Yes Historical Provider, MD  Magnesium 400 MG TABS Take 1 tablet by mouth 2 (two) times daily.   Yes Historical Provider, MD  metoprolol (LOPRESSOR) 50 MG tablet Take 1 tablet 2 times daily on Saturday and Sunday and resume one and a half tablets 2 times daily on Monday 04/06/14. Patient taking differently: Take 50 mg by mouth 2 (two) times daily.  04/04/14  Yes Elliot Cousin, MD  Multiple Vitamins-Minerals (MULTIVITAMIN WITH MINERALS) tablet Take 1 tablet by mouth daily.     Yes Historical Provider, MD  oxyCODONE-acetaminophen (PERCOCET/ROXICET) 5-325 MG tablet Take 1 tablet by mouth every 6 (six) hours as needed for severe pain. 01/31/15  Yes Raeford Razor, MD  potassium chloride SA (K-DUR,KLOR-CON) 20 MEQ tablet TAKE 1 TABLET BY MOUTH ONCE DAILY 12/12/14  Yes Malissa Hippo, MD  predniSONE (DELTASONE) 5 MG tablet Take 7.5 mg by mouth 2 (two) times daily.  03/13/15  Yes Historical Provider, MD  spironolactone (ALDACTONE) 50 MG tablet TAKE 1 TABLET BY MOUTH DAILY 02/25/15  Yes Malissa Hippo, MD  Travoprost, BAK Free, (TRAVATAN) 0.004 % SOLN ophthalmic solution Place 1 drop into both eyes at bedtime. 09/20/11  Yes Elliot Cousin, MD  ursodiol (ACTIGALL) 300 MG capsule TAKE 1 CAPSULE 3 TIMES DAILY. 12/04/14  Yes Malissa Hippo, MD   Physical Exam: Filed Vitals:   03/15/15 0930 03/15/15 1000 03/15/15 1030 03/15/15 1301  BP: 131/94 130/71 118/83 104/57  Pulse: 94 107 97 85  Temp:        TempSrc:      Resp:    16  Height:      Weight:      SpO2: 94% 94% 88% 90%    Wt Readings from Last 3 Encounters:  03/15/15 69.4 kg (153 lb)  03/13/15 69.4 kg (153 lb)  02/09/15  70.035 kg (154 lb 6.4 oz)    General:  Appears calm and comfortable Eyes: PERRL, normal lids, irises & conjunctiva ENT: grossly normal hearing, lips & tongue Neck: no LAD, masses or thyromegaly Cardiovascular: Irregular. No LE edema. Telemetry: SR, no arrhythmias  Respiratory: Mild crackles at the bases. Normal respiratory effort.  Abdomen: soft, ntnd Skin: no rash or induration seen on limited exam Musculoskeletal:  grossly normal tone BUE/BLE Psychiatric: grossly normal mood and affect, speech fluent and appropriate Neurologic: 3/5 strength bilaterally in the LE. Strength is 5/5 in bilateral upper extremities.           Labs on Admission:  Basic Metabolic Panel:  Recent Labs Lab 03/13/15 1429 03/15/15 1032  NA 135 135  K 4.0 4.1  CL 100* 100*  CO2 25 27  GLUCOSE 182* 147*  BUN 17 21*  CREATININE 1.24* 1.27*  CALCIUM 10.1 9.5   Liver Function Tests:  Recent Labs Lab 03/15/15 1032  AST 283*  ALT 21  ALKPHOS 55  BILITOT 1.7*  PROT 5.7*  ALBUMIN 2.5*       Recent Labs Lab 03/15/15 1032  AMMONIA 31   CBC:  Recent Labs Lab 03/13/15 1429 03/15/15 1032  WBC 4.7 4.1  NEUTROABS  --  3.3  HGB 13.6 13.2  HCT 39.7 38.2  MCV 83.9 83.2  PLT 72* 63*   Cardiac Enzymes:  Recent Labs Lab 03/13/15 1429  TROPONINI 0.03    BNP (last 3 results)  Recent Labs  03/13/15 1429  BNP 426.0*     Radiological Exams on Admission: Dg Chest 1 View  03/15/2015  CLINICAL DATA:  82 year old female with history of chronic lower back pain. EXAM: CHEST 1 VIEW COMPARISON:  Chest x-ray 03/13/2015. FINDINGS: Mild diffuse peribronchial cuffing. There is cephalization of the pulmonary vasculature and slight indistinctness of the interstitial markings suggestive of mild pulmonary edema.  Small left pleural effusion. Moderate cardiomegaly. The patient is rotated to the left on today's exam, resulting in distortion of the mediastinal contours and reduced diagnostic sensitivity and specificity for mediastinal pathology. Atherosclerosis in the thoracic aorta. IMPRESSION: 1. The appearance the chest suggests mild congestive heart failure, as above. 2. Atherosclerosis. Electronically Signed   By: Trudie Reed M.D.   On: 03/15/2015 11:00   Dg Chest 2 View  03/13/2015  CLINICAL DATA:  Cough for 5 days.  Weakness. EXAM: CHEST - 2 VIEW COMPARISON:  Two-view chest x-ray 01/03/2014 FINDINGS: The heart is enlarged. Atherosclerotic changes are present at the aorta. Mild edema is present. Small bilateral pleural effusions are noted. Mild bibasilar airspace disease likely reflects atelectasis. Vascular calcifications are present in the splenic artery. IMPRESSION: 1. Cardiomegaly and mild edema suggesting congestive heart failure. 2. Small bilateral pleural effusions and associated atelectasis. Electronically Signed   By: Marin Roberts M.D.   On: 03/13/2015 14:01   Ct Head Wo Contrast  03/13/2015  CLINICAL DATA:  Legs are givnig out starting around 11 a.m. today causing patient fall backwards. EXAM: CT HEAD WITHOUT CONTRAST TECHNIQUE: Contiguous axial images were obtained from the base of the skull through the vertex without intravenous contrast. COMPARISON:  MRI 04/03/2014.  CT 04/02/2014. FINDINGS: There is no evidence for acute hemorrhage, hydrocephalus, mass lesion, or abnormal extra-axial fluid collection. No definite CT evidence for acute infarction. Diffuse loss of parenchymal volume is consistent with atrophy. The visualized paranasal sinuses and mastoid air cells are clear. IMPRESSION: 1. No acute intracranial abnormality. 2. Atrophy. Electronically Signed   By: Minerva Areola  Molli Posey M.D.   On: 03/13/2015 18:09   Ct Lumbar Spine Wo Contrast  03/15/2015  CLINICAL DATA:  82 year old female with  leg weakness resulting in her falling backwards into her recliner. EXAM: CT LUMBAR SPINE WITHOUT CONTRAST TECHNIQUE: Multidetector CT imaging of the lumbar spine was performed without intravenous contrast administration. Multiplanar CT image reconstructions were also generated. COMPARISON:  Lumbar spine radiographs 01/31/2015 FINDINGS: Calcified upper abdominal lymph nodes noted incidentally. 4 cm water attenuation cyst in the hilum of the left kidney. Smaller 1.9 cm water attenuation cyst exophytic from the anterior upper pole the left kidney. Atherosclerotic calcifications throughout the abdominal aorta without evidence of aneurysm. The remainder the visualized intra-abdominal contents are unremarkable. No evidence of acute fracture or malalignment. Vertebral body heights are maintained. Multilevel lower lumbar degenerative disc disease with vacuum disc phenomenon at L4-L5 and L5-S1. There is mild leftward translation of L4 on L5 resulting in mild focal levoconvex scoliosis. Due to a combination of a posterior disc bulge and a peripherally calcified right facet joint synovial cyst versus asymmetric ligamentum flavum hypertrophy there is at least moderate central canal stenosis and severe right lateral recess stenosis at L3-L4. IMPRESSION: 1. No acute fracture or malalignment. 2. Multilevel degenerative disc disease most significant at L3-L4 were there is at least moderate central canal and advanced right lateral recess stenosis secondary to a combination of posterior disc bulge and right facet joint synovial cyst versus asymmetric ligamentum flavum hypertrophy. 3. Degenerative lateral translation to the left of L4 on L5 resulting in mild focal levoconvex scoliosis. 4. Aortic atherosclerosis. 5. Left renal cysts. Electronically Signed   By: Malachy Moan M.D.   On: 03/15/2015 11:08    Assessment/Plan Active Problems:   HTN (hypertension), benign   Thrombocytopenia (HCC)   Rheumatoid arthritis (HCC)    Anemia of chronic disease   Atrial flutter (HCC)   COPD (chronic obstructive pulmonary disease) (HCC)   Chronic renal disease, stage 3, moderately decreased glomerular filtration rate between 30-59 mL/min/1.73 square meter   Bilateral leg weakness   1. Bilateral LE weakness. CT of the lumbar spine revealed multilevel degenerative disc disease changes specifically at the L3-L4 area with moderate central canal stenosis and severe right lateral recess stenosis at the L3-L4 area. Although she has been having back pain for several months now, her decline in function appears to be relatively acute. The patient was ambulating/driving prior to this event 2 days ago. She denies any hx of trauma or heavy lifting. Since her weakness appears to be acute, she will need MRI of the lumbar spine for further evaluation. Discussed with Dr. Conley Rolls, who agrees with transfer. Will plan on transfer to [facility] for MRI and evaluation by spine specialist, pending results of MRI 2. Chronic back pain, continue pain management.  3. Essential HTN, stable. Continue home medications.  4. CKD stage 3, stable. Baseline creatinine appears to be around 1.2-1.5. Will continue to monitor.  5. Anemia of chronic disease, stable. No evidence of bleeding. Will continue to monitor. 6. COPD, no evidence of exacerbation. Continue bronchodilators as needed.  7. RA, continue current medications.  8. GERD, continue PPI 9. Atrial flutter, chronic. Continue rate control with Cardizem. She is now on anticoagulation due to thrombocytopenia and hx of bleeding.   10. Thrombocytopenia, chronic. Platelets 63, will continue to monitor.     Code Status: Full DVT Prophylaxis: SCDs Family Communication:  Daughters at bedside. Disposition Plan: Transfer to [facility] for further management.  Time spent: 55 minutes  [employer]. MD [facility] Pager [phone]   By signing my name below, I, Burnett Harry, attest that this documentation has  been prepared under the direction and in the presence of [facility]. MD Electronically Signed: Burnett Harry, Scribe. 03/15/2015 12:25pm  I, Dr. Erick Blinks, personally performed the services described in this documentaiton. All medical record entries made by the scribe were at my direction and in my presence. I have reviewed the chart and agree that the record reflects my personal performance and is accurate and complete  Erick Blinks, MD, 03/15/2015 1:51 PM

## 2015-03-15 NOTE — ED Notes (Signed)
Hospitalist at bedside 

## 2015-03-15 NOTE — ED Notes (Signed)
[facility] at bedside 

## 2015-03-15 NOTE — ED Notes (Signed)
PA Hobson Bryant at bedside updating patient and family. 

## 2015-03-15 NOTE — ED Provider Notes (Signed)
CSN: [id number]     Arrival date & time 03/15/15  0912 History   First MD Initiated Contact with Patient 03/15/15 [phone]     Chief Complaint  Patient presents with  . Back Pain     (Consider location/radiation/quality/duration/timing/severity/associated sxs/prior Treatment) HPI Comments: Patient is an 82 year old female who presents to the emergency department with complaint of weakness of the right greater than left lower extremity, and changes in her ability to ambulate.  Patient has a history of degenerative joint disease. Hypertension, gastric ulcers, chronic kidney disease, multiple blood transfusions with autoimmune hepatitis, sciatica, chronic back pain. There is also a history of cardiac dysrhythmias, as well as stroke.  The family reports that over the last for 5 days they've been noticing increasing problems with weakness involving the lower extremities. The patient's baseline is using a walker and sometimes a cane to get around. The family now notices that she cannot walk for any distance in the home, even with support. There is also a significant amount of pain, and there is problem keeping the pain under control with current medications. There's been no loss of bowel or bladder function. There's been no loss of consciousness. His been no excessive vomiting. There has been constipation, but no diarrhea. His been no previous operations or procedures involving the lower back. It is of note that 2 days ago the patient had a CT scan of the head which was negative for new cerebrovascular problem.  Patient is a 82 y.o. female presenting with back pain. The history is provided by a relative and a caregiver.  Back Pain   Past Medical History  Diagnosis Date  . History of GI bleed     Associated with NSAIDS  . Iron deficiency anemia     Transfusion dependent  . DJD (degenerative joint disease)   . Essential hypertension, benign   . History of colitis   . Ileitis   . Pancytopenia   .  Autoimmune hepatitis (HCC)     With leukocytoclastic vasculitis  . Heart block AV second degree March 2013    a. Cardiology consult note 06/2013: "Question of previous second degree heart block, although review of cardiology notes indicates that this may have been actually blocked PACs when the patient was on verapamil."  . Bronchitis   . Steroid-induced myopathy   . Renal calculus 08/12/2011  . Rheumatoid arthritis(714.0)   . Colon ulcer 04/2010    NSAID related  . Gastric ulcer 04/2010    NSAID related  . UGI bleed 09/20/2011    Focal area of gastritis oozing of blood  . Gastric AVM 09/20/2011  . Candida esophagitis (HCC) 09/20/2011  . Paroxysmal atrial fibrillation (HCC)     Not on anticoag 2/2 hx of GIB/AVM  . CKD (chronic kidney disease), stage III   . Cholestatic hepatitis   . UTI (lower urinary tract infection)     history  . Paroxysmal atrial flutter (HCC)     Not on anticoag 2/2 hx of GIB/AVM - dx 06/2013.  . Multifocal atrial tachycardia (HCC)   . Hypertrophic cardiomyopathy (HCC)     Echo 03/2011: severe LVH suggesting possble infiltrate cardiomyopathy or advanced hypertensive heart disease, increased  echogenicity of the ventricular septum as well as the pericardium, EF >70% with end systolicmid-cavitary obliteration of the ventricle, stage 1 DD, mild MR, small IVC.  Marland Kitchen Hypomagnesemia   . Chronic renal disease, stage 3, moderately decreased glomerular filtration rate between 30-59 mL/min/1.73 square meter 02/03/2014  . Anemia  of chronic disease 11/25/2011  . Anemia of chronic renal failure, stage 3 (moderate) 02/03/2014  . Low back pain   . 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 Biatrial enlargement 04/04/2014  . History of pneumonia 12/2013  . Atrial fibrillation [facility])    Past Surgical History  Procedure Laterality Date  . Colonoscopy  04/2010  . Upper gastrointestinal endoscopy  04/2010  . Esophagogastroduodenoscopy  09/20/2011    Status post APC.  Marland Kitchen Esophagogastroduodenoscopy   09/20/2011    Procedure: ESOPHAGOGASTRODUODENOSCOPY (EGD);  Surgeon: Malissa Hippo, MD;  Location: [facility];  Service: Endoscopy;  Laterality: N/A;  . Cyst removal hand      Elbow  . Sebaceous cyst removed      bilateral elbows  . Colonoscopy N/A 07/04/2014    Procedure: COLONOSCOPY;  Surgeon: Malissa Hippo, MD;  Location: [facility];  Service: Endoscopy;  Laterality: N/A;  730  . Esophagogastroduodenoscopy N/A 07/04/2014    Procedure: ESOPHAGOGASTRODUODENOSCOPY (EGD);  Surgeon: Malissa Hippo, MD;  Location: [facility];  Service: Endoscopy;  Laterality: N/A;  . Givens capsule study N/A 07/14/2014    Procedure: GIVENS CAPSULE STUDY;  Surgeon: Malissa Hippo, MD;  Location: [facility];  Service: Endoscopy;  Laterality: N/A;  730   Family History  Problem Relation Age of Onset  . Stroke Mother   . Hypertension Sister   . Hypertension Brother   . Heart disease Mother 62  . Heart disease Father 95  . COPD Brother   . Arthritis Brother   . Osteoporosis Sister    Social History  Substance Use Topics  . Smoking status: Current Some Day Smoker -- 0.25 packs/day for 50 years    Types: Cigarettes    Start date: 04/10/1945  . Smokeless tobacco: Never Used  . Alcohol Use: No   OB History    Gravida Para Term Preterm AB TAB SAB Ectopic Multiple Living   4 4 4       4      Review of Systems  Respiratory: Positive for cough.   Cardiovascular: Positive for palpitations.  Musculoskeletal: Positive for back pain, arthralgias and gait problem.  All other systems reviewed and are negative.     Allergies  Iohexol; Ivp dye; Naprosyn; Red blood cells; Verapamil; Vicodin; and Sulfa antibiotics  Home Medications   Prior to Admission medications   Medication Sig Start Date End Date Taking? Authorizing Provider  acetaminophen (TYLENOL) 325 MG tablet Take 650 mg by mouth every 6 (six) hours as needed. For pain    Historical Provider, MD  aspirin 81 MG chewable tablet Chew 2  tablets (162 mg total) by mouth daily. 04/04/14   06/04/14, MD  azaTHIOprine (IMURAN) 50 MG tablet TAKE 1 AND 1/2 TABLETS BY MOUTH ONCE DAILY 03/06/15   05/06/15, NP  cetirizine (ZYRTEC) 10 MG tablet Take 10 mg by mouth daily as needed for allergies.    Historical Provider, MD  Cholecalciferol (VITAMIN D) 2000 UNITS tablet Take 2,000 Units by mouth daily.    Historical Provider, MD  Coenzyme Q10 200 MG capsule Take 200 mg by mouth daily.    Historical Provider, MD  Darbepoetin Alfa (ARANESP) 500 MCG/ML SOSY injection Inject 500 mcg into the skin every 21 ( twenty-one) days.    Historical Provider, MD  DEXILANT 60 MG capsule TAKE 1 CAPSULE BY MOUTH EVERY OTHER DAY 10/21/14   10/23/14, MD  diltiazem (TIAZAC) 360 MG 24 hr capsule Take  1 capsule (360 mg total) by mouth daily. 01/06/14   Hollice Espy, MD  fish oil-omega-3 fatty acids 1000 MG capsule Take 1 g by mouth daily.    Historical Provider, MD  fluticasone (FLONASE) 50 MCG/ACT nasal spray Place 2 sprays into both nostrils daily as needed for allergies.  03/26/14   Historical Provider, MD  folic acid (FOLVITE) 800 MCG tablet Take 800 mcg by mouth daily.     Historical Provider, MD  furosemide (LASIX) 20 MG tablet Take 20 mg by mouth daily as needed. For fluid retention 04/26/11   Malissa Hippo, MD  Hydrocodone-Acetaminophen 2.5-325 MG TABS Take 1 tablet by mouth 3 (three) times daily as needed. 09/22/14   Allene Pyo, MD  lidocaine-prilocaine (EMLA) cream Apply 1 application topically as needed (pain).  06/20/14   Historical Provider, MD  Magnesium 400 MG TABS Take 1 tablet by mouth 2 (two) times daily.    Historical Provider, MD  metoprolol (LOPRESSOR) 50 MG tablet Take 1 tablet 2 times daily on Saturday and Sunday and resume one and a half tablets 2 times daily on Monday 04/06/14. Patient taking differently: Take 50 mg by mouth 2 (two) times daily.  04/04/14   Elliot Cousin, MD  Multiple Vitamins-Minerals (MULTIVITAMIN WITH  MINERALS) tablet Take 1 tablet by mouth daily.      Historical Provider, MD  oxyCODONE-acetaminophen (PERCOCET/ROXICET) 5-325 MG tablet Take 1 tablet by mouth every 6 (six) hours as needed for severe pain. 01/31/15   Raeford Razor, MD  potassium chloride SA (K-DUR,KLOR-CON) 20 MEQ tablet TAKE 1 TABLET BY MOUTH ONCE DAILY 12/12/14   Malissa Hippo, MD  predniSONE (DELTASONE) 10 MG tablet Take 10 mg by mouth daily with breakfast.    Historical Provider, MD  spironolactone (ALDACTONE) 50 MG tablet TAKE 1 TABLET BY MOUTH DAILY 02/25/15   Malissa Hippo, MD  Travoprost, BAK Free, (TRAVATAN) 0.004 % SOLN ophthalmic solution Place 1 drop into both eyes at bedtime. 09/20/11   Elliot Cousin, MD  ursodiol (ACTIGALL) 300 MG capsule TAKE 1 CAPSULE 3 TIMES DAILY. 12/04/14   Malissa Hippo, MD  vitamin B-12 (CYANOCOBALAMIN) 1000 MCG tablet Take 500 mcg by mouth daily.     Historical Provider, MD   BP 132/98 mmHg  Pulse 102  Temp(Src) 98.1 F (36.7 C) (Oral)  Resp 18  Ht 5\' 5"  (1.651 m)  Wt 69.4 kg  BMI 25.46 kg/m2  SpO2 97% Physical Exam  Constitutional: She is oriented to person, place, and time. She appears well-developed and well-nourished.  Non-toxic appearance.  HENT:  Head: Normocephalic.  Right Ear: Tympanic membrane and external ear normal.  Left Ear: Tympanic membrane and external ear normal.  Eyes: EOM and lids are normal. Pupils are equal, round, and reactive to light.  Neck: Normal range of motion. Neck supple. Carotid bruit is not present.  Cardiovascular: Normal heart sounds, intact distal pulses and normal pulses.  An irregularly irregular rhythm present. Tachycardia present.   Pulmonary/Chest: Breath sounds normal. No respiratory distress.  Abdominal: Soft. Bowel sounds are normal. There is no tenderness. There is no guarding.  Musculoskeletal:       Lumbar back: She exhibits decreased range of motion and pain.  Lymphadenopathy:       Head (right side): No submandibular adenopathy  present.       Head (left side): No submandibular adenopathy present.    She has no cervical adenopathy.  Neurological: She is alert and oriented to person, place, and  time. She has normal strength. No cranial nerve deficit or sensory deficit.  There is equal strength of the upper extremities. There is right greater than left motor weakness involving the lower extremity.  Patient unable to stand because of pain and weakness.  Skin: Skin is warm and dry.  Psychiatric: She has a normal mood and affect. Her speech is normal.  Nursing note and vitals reviewed.   ED Course  Procedures (including critical care time) Labs Review Labs Reviewed - No data to display  Imaging Review Dg Chest 2 View  03/13/2015  CLINICAL DATA:  Cough for 5 days.  Weakness. EXAM: CHEST - 2 VIEW COMPARISON:  Two-view chest x-ray 01/03/2014 FINDINGS: The heart is enlarged. Atherosclerotic changes are present at the aorta. Mild edema is present. Small bilateral pleural effusions are noted. Mild bibasilar airspace disease likely reflects atelectasis. Vascular calcifications are present in the splenic artery. IMPRESSION: 1. Cardiomegaly and mild edema suggesting congestive heart failure. 2. Small bilateral pleural effusions and associated atelectasis. Electronically Signed   By: Marin Roberts M.D.   On: 03/13/2015 14:01   Ct Head Wo Contrast  03/13/2015  CLINICAL DATA:  Legs are givnig out starting around 11 a.m. today causing patient fall backwards. EXAM: CT HEAD WITHOUT CONTRAST TECHNIQUE: Contiguous axial images were obtained from the base of the skull through the vertex without intravenous contrast. COMPARISON:  MRI 04/03/2014.  CT 04/02/2014. FINDINGS: There is no evidence for acute hemorrhage, hydrocephalus, mass lesion, or abnormal extra-axial fluid collection. No definite CT evidence for acute infarction. Diffuse loss of parenchymal volume is consistent with atrophy. The visualized paranasal sinuses and mastoid  air cells are clear. IMPRESSION: 1. No acute intracranial abnormality. 2. Atrophy. Electronically Signed   By: Kennith Center M.D.   On: 03/13/2015 18:09   I have personally reviewed and evaluated these images and lab results as part of my medical decision-making.   EKG Interpretation None      MDM  I have reviewed the recent emergency department visits, and imaging.  Chest x-ray suggest a cardiomegaly and mild congestive heart failure. There is also small left pleural effusions present.  CT scan of the lumbar spine reveals no acute fracture, but there is multilevel degenerative disc disease changes present. In particular at the L3-L4 area there is moderate central canal stenosis and severe right lateral recess stenosis at the L3-L4 area. There is also noted scoliosis present.  Pain somewhat improved with IV pain medication. The family states that they have been attempting to have the patient evaluated by one of the local internal medicine physicians. The patient is now unable to walk with a cane, and/or walker. I have discussed these findings with the family. They request to see if the patient can be admitted to the hospital . Dr Kerry Hough will see pt for admision.   Final diagnoses:  Weakness of both lower extremities  DDD (degenerative disc disease), lumbar      I have reviewed nursing notes, vital signs, and all appropriate lab and imaging results for this patient.  Ivery Quale, PA-C 03/15/15 1357  Ivery Quale, PA-C 03/15/15 1359  Marily Memos, MD 03/15/15 1515

## 2015-03-16 ENCOUNTER — Other Ambulatory Visit (HOSPITAL_COMMUNITY): Payer: Medicare Other

## 2015-03-16 ENCOUNTER — Ambulatory Visit (HOSPITAL_COMMUNITY): Payer: Medicare Other

## 2015-03-16 DIAGNOSIS — R29898 Other symptoms and signs involving the musculoskeletal system: Secondary | ICD-10-CM

## 2015-03-16 DIAGNOSIS — N183 Chronic kidney disease, stage 3 (moderate): Secondary | ICD-10-CM | POA: Diagnosis not present

## 2015-03-16 DIAGNOSIS — D638 Anemia in other chronic diseases classified elsewhere: Secondary | ICD-10-CM | POA: Diagnosis not present

## 2015-03-16 DIAGNOSIS — S3210XA Unspecified fracture of sacrum, initial encounter for closed fracture: Secondary | ICD-10-CM

## 2015-03-16 DIAGNOSIS — I10 Essential (primary) hypertension: Secondary | ICD-10-CM

## 2015-03-16 DIAGNOSIS — S39012A Strain of muscle, fascia and tendon of lower back, initial encounter: Secondary | ICD-10-CM

## 2015-03-16 DIAGNOSIS — S32139A Unspecified Zone III fracture of sacrum, initial encounter for closed fracture: Secondary | ICD-10-CM | POA: Diagnosis not present

## 2015-03-16 DIAGNOSIS — D696 Thrombocytopenia, unspecified: Secondary | ICD-10-CM

## 2015-03-16 LAB — BASIC METABOLIC PANEL
Anion gap: 12 (ref 5–15)
BUN: 20 mg/dL (ref 6–20)
CHLORIDE: 103 mmol/L (ref 101–111)
CO2: 22 mmol/L (ref 22–32)
Calcium: 9.8 mg/dL (ref 8.9–10.3)
Creatinine, Ser: 1.51 mg/dL — ABNORMAL HIGH (ref 0.44–1.00)
GFR calc Af Amer: 36 mL/min — ABNORMAL LOW (ref >60)
GFR calc non Af Amer: 31 mL/min — ABNORMAL LOW (ref >60)
GLUCOSE: 157 mg/dL — AB (ref 65–99)
POTASSIUM: 5 mmol/L (ref 3.5–5.1)
Sodium: 137 mmol/L (ref 135–145)

## 2015-03-16 LAB — CBC
HEMATOCRIT: 39.6 % (ref 36.0–46.0)
Hemoglobin: 13.6 g/dL (ref 12.0–15.0)
MCH: 28.4 pg (ref 26.0–34.0)
MCHC: 34.3 g/dL (ref 30.0–36.0)
MCV: 82.7 fL (ref 78.0–100.0)
PLATELETS: 79 10*3/uL — AB (ref 150–400)
RBC: 4.79 MIL/uL (ref 3.87–5.11)
RDW: 20.9 % — AB (ref 11.5–15.5)
WBC: 3.5 10*3/uL — AB (ref 4.0–10.5)

## 2015-03-16 MED ORDER — POLYETHYLENE GLYCOL 3350 17 G PO PACK
17.0000 g | PACK | Freq: Every day | ORAL | Status: DC
  Administered 2015-03-16 – 2015-03-18 (×3): 17 g via ORAL
  Filled 2015-03-16 (×3): qty 1

## 2015-03-16 MED ORDER — OXYCODONE HCL 5 MG PO TABS
5.0000 mg | ORAL_TABLET | ORAL | Status: DC | PRN
  Administered 2015-03-16 – 2015-03-17 (×2): 5 mg via ORAL
  Filled 2015-03-16 (×3): qty 1

## 2015-03-16 MED ORDER — DILTIAZEM HCL ER COATED BEADS 360 MG PO CP24
360.0000 mg | ORAL_CAPSULE | Freq: Every day | ORAL | Status: DC
  Administered 2015-03-16 – 2015-03-18 (×3): 360 mg via ORAL
  Filled 2015-03-16: qty 2
  Filled 2015-03-16 (×3): qty 1
  Filled 2015-03-16 (×2): qty 2

## 2015-03-16 NOTE — Progress Notes (Addendum)
[facility] PROGRESS NOTE    Progress Note   Eileen Santana:[id number] DOB: [birth year] DOA: 03/15/2015 PCP: [name], MD   Brief Narrative:   Eileen Santana is an 82 y.o. female 82 y.o. female with a hx of degenerative joint disease, HTN, gastric ulcers, CKD, autoimmune hepatitis, sciatica, and chronic back pain presents with bilateral LE weakness (R>L) and difficulty ambulating. Per family at bedside, over the last 2 days they have been noticing increasing weakness involving the BLE and inability to ambulate.  Assessment/Plan:   Bilateral leg weakness/chronic back pain: MRI of the lumbar spine and sacrum shows an acute sacral stress fracture, large right foraminal disc protrusion at L4-L5. DC heparin, start SCDs. Change narcotics to orals, we'll start her on MiraLAX daily. Her neurological exam is completely intact she is having regular bowel movements and has control over bladder.  Essential  HTN (hypertension), benign: Blood pressure seems to be control continue current regimen.  Chronic Thrombocytopenia (HCC) Has remained stable follow-up with PCP as an outpatient.  Rheumatoid arthritis (HCC) Continue current regimen.  Anemia of chronic disease Seems to be at baseline.  Atrial flutter (HCC) Rate control continue aspirin.  COPD (chronic obstructive pulmonary disease) (HCC): No evidence of exacerbation. Continue bronchodilators as needed.   Chronic renal disease, stage 3, moderately decreased glomerular filtration rate between 30-59 mL/min/1.73 square meter Appears to be at baseline.    DVT Prophylaxis - SCD's  Family Communication: daughter Disposition Plan: Home 1-2 days Code Status:     Code Status Orders        Start     Ordered   03/15/15 1614  Full code   Continuous     03/15/15 1613    Code Status History    Date Active Date Inactive Code Status Order ID Comments User Context   07/18/2014  3:39 PM 07/19/2014  2:24 PM Full Code  [id number]  Leatha Gilding, MD ED   04/03/2014  2:48 AM 04/04/2014  5:43 PM Full Code [id number]  Houston Siren, MD Inpatient   12/29/2013  4:29 PM 01/06/2014  7:35 PM Full Code [id number]  Standley Brooking, MD ED   06/11/2013  7:46 PM 06/13/2013  4:18 PM Full Code [id number]  Wilson Singer, MD Inpatient   09/18/2011  1:53 PM 09/20/2011 10:46 PM Full Code [id number]  Elliot Cousin, MD ED   08/10/2011  2:59 PM 08/12/2011  1:57 PM Full Code [id number]  Elliot Cousin, MD ED    Advance Directive Documentation        Most Recent Value   Type of Advance Directive  Healthcare Power of Attorney, Living will   Pre-existing out of facility DNR order (yellow form or pink MOST form)     "MOST" Form in Place?          IV Access:    Peripheral IV   Procedures and diagnostic studies:   Dg Chest 1 View  03/15/2015  CLINICAL DATA:  82 year old female with history of chronic lower back pain. EXAM: CHEST 1 VIEW COMPARISON:  Chest x-ray 03/13/2015. FINDINGS: Mild diffuse peribronchial cuffing. There is cephalization of the pulmonary vasculature and slight indistinctness of the interstitial markings suggestive of mild pulmonary edema. Small left pleural effusion. Moderate cardiomegaly. The patient is rotated to the left on today's exam, resulting in distortion of the mediastinal contours and reduced diagnostic sensitivity and specificity for mediastinal pathology. Atherosclerosis in the thoracic aorta. IMPRESSION: 1. The appearance the chest suggests mild congestive heart failure, as above.  2. Atherosclerosis. Electronically Signed   By: Trudie Reed M.D.   On: 03/15/2015 11:00   Ct Lumbar Spine Wo Contrast  03/15/2015  CLINICAL DATA:  82 year old female with leg weakness resulting in her falling backwards into her recliner. EXAM: CT LUMBAR SPINE WITHOUT CONTRAST TECHNIQUE: Multidetector CT imaging of the lumbar spine was performed without intravenous contrast administration. Multiplanar CT image reconstructions were also  generated. COMPARISON:  Lumbar spine radiographs 01/31/2015 FINDINGS: Calcified upper abdominal lymph nodes noted incidentally. 4 cm water attenuation cyst in the hilum of the left kidney. Smaller 1.9 cm water attenuation cyst exophytic from the anterior upper pole the left kidney. Atherosclerotic calcifications throughout the abdominal aorta without evidence of aneurysm. The remainder the visualized intra-abdominal contents are unremarkable. No evidence of acute fracture or malalignment. Vertebral body heights are maintained. Multilevel lower lumbar degenerative disc disease with vacuum disc phenomenon at L4-L5 and L5-S1. There is mild leftward translation of L4 on L5 resulting in mild focal levoconvex scoliosis. Due to a combination of a posterior disc bulge and a peripherally calcified right facet joint synovial cyst versus asymmetric ligamentum flavum hypertrophy there is at least moderate central canal stenosis and severe right lateral recess stenosis at L3-L4. IMPRESSION: 1. No acute fracture or malalignment. 2. Multilevel degenerative disc disease most significant at L3-L4 were there is at least moderate central canal and advanced right lateral recess stenosis secondary to a combination of posterior disc bulge and right facet joint synovial cyst versus asymmetric ligamentum flavum hypertrophy. 3. Degenerative lateral translation to the left of L4 on L5 resulting in mild focal levoconvex scoliosis. 4. Aortic atherosclerosis. 5. Left renal cysts. Electronically Signed   By: Malachy Moan M.D.   On: 03/15/2015 11:08   Mr Lumbar Spine Wo Contrast  03/15/2015  CLINICAL DATA:  Chronic back pain presenting with bilateral lower extremity weakness and difficulty ambulating. History of autoimmune hepatitis, degenerative joint disease. EXAM: MRI LUMBAR SPINE WITHOUT CONTRAST TECHNIQUE: Multiplanar, multisequence MR imaging of the lumbar spine was performed. No intravenous contrast was administered. COMPARISON:   CT lumbar spine March 15, 2015 at 10:50 a.m. FINDINGS: Lumbar vertebral bodies intact. Using the reference level of the last well-formed intervertebral disc as L5-S1, grade 1 L5-S1 retrolisthesis without spondylolysis. Maintenance of lumbar lordosis. Moderate L4-5 and moderate to severe L5-S1 disc height loss with linear central low signal compatible of vacuum disc. Associated moderate chronic discogenic endplate changes toward the LEFT at L5-S1. Mild chronic discogenic endplate changes L3-4 and L4-5. Generalized bright T1 bone marrow signal compatible with osteopenia without STIR signal abnormality to suggest acute lumbar spine fracture. However, there is bright STIR signal, low T1 signal within S3 vertebral body with associated with ventral cortical irregularity compatible with acute stress injury. Conus medullaris terminates L1-2 and appears normal morphology and signal characteristics. Central displacement of the cauda equina due to canal stenosis. Moderate to severe symmetric paraspinal muscle atrophy. T2 bright cyst LEFT kidney measuring 4.5 cm. Irregularity of the aorta are without aneurysmal dilatation compatible with atherosclerosis. Level by level evaluation: L1-2: No significant disc bulge, canal stenosis or neural foraminal narrowing. L2-3: Annular bulging, mild facet arthropathy and ligamentum flavum redundancy without canal stenosis. Minimal neural foraminal narrowing. L3-4: 2 mm broad-based disc bulge asymmetric laterally, encroaching upon the exited RIGHT L3 nerve. Moderate to severe RIGHT and mild LEFT facet arthropathy and ligamentum flavum redundancy with superimposed 5 x 9 x 14 mm (transverse by AP by CC) RIGHT facet synovial cyst extending medially into the lateral epidural  space, resulting in moderate canal stenosis and RIGHT lateral recess effacement which may affect the traversing RIGHT L4 nerve. Moderate to severe RIGHT, mild LEFT neural foraminal narrowing. L4-5: 5 mm broad-based disc  bulge. Severe RIGHT and mild to moderate LEFT facet arthropathy and ligamentum flavum redundancy. Trace RIGHT facet effusion, with sub cm RIGHT facet synovial cyst extending into the paraspinal soft tissues. Mild to moderate canal stenosis with partial effacement lateral recesses which may affect the traversing L5 nerves. Superimposed large RIGHT extra foraminal disc protrusion posteriorly displaces the exited RIGHT L4 nerve, and results in severe RIGHT neural foraminal narrowing. Mild to moderate LEFT neural foraminal narrowing. L5-S1: Retrolisthesis. 4 mm broad-based disc bulge asymmetric to LEFT may affect the exited LEFT L5 nerve. Mild to moderate facet arthropathy and ligamentum flavum redundancy without canal stenosis. Mild RIGHT, severe LEFT neural foraminal narrowing. Multilevel Tarlov cysts measure up to 14 mm. IMPRESSION: Acute sacral (S3) stress fracture. Degenerative lumbar spine resulting in moderate canal stenosis at L3-4, mild at L4-5. Large RIGHT extra foraminal L4-5 disc protrusion displaces the exited RIGHT L4. Neural foraminal narrowing L2-3 through L5-S1: Severe on the RIGHT at L4-5, severe on the LEFT at L5-S1. No acute lumbar spine fracture. Grade 1 L5-S1 retrolisthesis on degenerative basis. Electronically Signed   By: Awilda Metro M.D.   On: 03/15/2015 22:03     Medical Consultants:    None.  Anti-Infectives:   Anti-infectives    None      Subjective:    [name] she relates her pain is better  Objective:    Filed Vitals:   03/15/15 1624 03/16/15 0021 03/16/15 0033 03/16/15 0516  BP: 113/67 100/68  118/84  Pulse: 94 132  118  Temp: 98.1 F (36.7 C) 98.3 F (36.8 C)  97.3 F (36.3 C)  TempSrc: Oral Oral  Axillary  Resp: 18 18  18   Height: 5\' 5"  (1.651 m)     Weight: 69.219 kg (152 lb 9.6 oz)     SpO2: 95% 90% 96%     Intake/Output Summary (Last 24 hours) at 03/16/15 1039 Last data filed at 03/16/15 0900  Gross per 24 hour  Intake   1580 ml    Output    200 ml  Net   1380 ml   Filed Weights   03/15/15 0925 03/15/15 1624  Weight: 69.4 kg (153 lb) 69.219 kg (152 lb 9.6 oz)    Exam: Gen:  NAD Cardiovascular:  RRR. Chest and lungs:   CTAB Abdomen:  Abdomen soft, NT/ND, + BS Extremities:  No edema   Data Reviewed:    Labs: Basic Metabolic Panel:  Recent Labs Lab 03/13/15 1429 03/15/15 1032 03/16/15 0643  NA 135 135 137  K 4.0 4.1 5.0  CL 100* 100* 103  CO2 25 27 22   GLUCOSE 182* 147* 157*  BUN 17 21* 20  CREATININE 1.24* 1.27* 1.51*  CALCIUM 10.1 9.5 9.8   GFR Estimated Creatinine Clearance: 28.1 mL/min (by C-G formula based on Cr of 1.51). Liver Function Tests:  Recent Labs Lab 03/15/15 1032  AST 283*  ALT 21  ALKPHOS 55  BILITOT 1.7*  PROT 5.7*  ALBUMIN 2.5*   No results for input(s): LIPASE, AMYLASE in the last 168 hours.  Recent Labs Lab 03/15/15 1032  AMMONIA 31   Coagulation profile No results for input(s): INR, PROTIME in the last 168 hours.  CBC:  Recent Labs Lab 03/13/15 1429 03/15/15 1032 03/16/15 0643  WBC 4.7 4.1 3.5*  NEUTROABS  --  3.3  --   HGB 13.6 13.2 13.6  HCT 39.7 38.2 39.6  MCV 83.9 83.2 82.7  PLT 72* 63* 79*   Cardiac Enzymes:  Recent Labs Lab 03/13/15 1429  TROPONINI 0.03   BNP (last 3 results) No results for input(s): PROBNP in the last 8760 hours. CBG: No results for input(s): GLUCAP in the last 168 hours. D-Dimer: No results for input(s): DDIMER in the last 72 hours. Hgb A1c: No results for input(s): HGBA1C in the last 72 hours. Lipid Profile: No results for input(s): CHOL, HDL, LDLCALC, TRIG, CHOLHDL, LDLDIRECT in the last 72 hours. Thyroid function studies:  Recent Labs  03/15/15 1908  TSH 0.880   Anemia work up: No results for input(s): VITAMINB12, FOLATE, FERRITIN, TIBC, IRON, RETICCTPCT in the last 72 hours. Sepsis Labs:  Recent Labs Lab 03/13/15 1429 03/13/15 1431 03/13/15 1710 03/15/15 1032 03/16/15 0643  WBC 4.7  --    --  4.1 3.5*  LATICACIDVEN  --  2.7* 2.9*  --   --    Microbiology Recent Results (from the past 240 hour(s))  Urine culture     Status: None   Collection Time: 03/13/15  6:25 PM  Result Value Ref Range Status   Specimen Description URINE, CLEAN CATCH  Final   Special Requests NONE  Final   Culture   Final    MULTIPLE SPECIES PRESENT, SUGGEST RECOLLECTION Performed at [facility]    Report Status 03/15/2015 FINAL  Final     Medications:   . aspirin  162 mg Oral Daily  . azaTHIOprine  75 mg Oral Daily  . cholecalciferol  2,000 Units Oral Daily  . diltiazem  360 mg Oral Daily  . docusate sodium  100 mg Oral BID  . folic acid  1 mg Oral Daily  . latanoprost  1 drop Both Eyes QHS  . lidocaine  1 patch Transdermal Q24H  . magnesium oxide  400 mg Oral Daily  . metoprolol  50 mg Oral BID  . pantoprazole  40 mg Oral Daily  . potassium chloride SA  20 mEq Oral Daily  . predniSONE  7.5 mg Oral Q breakfast  . spironolactone  50 mg Oral Daily  . ursodiol  300 mg Oral BID   Continuous Infusions:   Time spent: 25 min   LOS: 1 day   Marinda Elk  [facility] Pager [phone]  *Please refer to [email or web address], password [email or web address] to get updated schedule on who will round on this patient, as hospitalists switch teams weekly. If 7PM-7AM, please contact night-coverage at [email or web address], password [email or web address] for any overnight needs.  03/16/2015, 10:39 AM

## 2015-03-17 DIAGNOSIS — S32139A Unspecified Zone III fracture of sacrum, initial encounter for closed fracture: Secondary | ICD-10-CM | POA: Diagnosis not present

## 2015-03-17 MED ORDER — OXYCODONE-ACETAMINOPHEN 5-325 MG PO TABS: 1.0000 | ORAL_TABLET | ORAL | Status: DC | PRN

## 2015-03-17 MED ORDER — POLYETHYLENE GLYCOL 3350 17 G PO PACK: 17.0000 g | PACK | Freq: Every day | ORAL | Status: DC

## 2015-03-17 NOTE — Care Management Note (Signed)
Case Management Note  Patient Details  Name: Tyshay Adee MRN: [id number] Date of Birth: July 11, 1930  Subjective/Objective:                    Action/Plan:  Will await PT eval Expected Discharge Date:                  Expected Discharge Plan:     In-House Referral:  Clinical Social Work  Discharge planning Services  CM Consult  Post Acute Care Choice:    Choice offered to:     DME Arranged:    DME Agency:     HH Arranged:    HH Agency:     Status of Service:  In process, will continue to follow  Medicare Important Message Given:    Date Medicare IM Given:    Medicare IM give by:    Date Additional Medicare IM Given:    Additional Medicare Important Message give by:     If discussed at Long Length of Stay Meetings, dates discussed:    Additional Comments:  Kingsley Plan, RN 03/17/2015, 10:28 AM

## 2015-03-17 NOTE — Evaluation (Signed)
Physical Therapy Evaluation Patient Details Name: Eileen Santana MRN: [id number] DOB: Feb 02, 1930 Today's Date: 03/17/2015   History of Present Illness  82 y.o. female admitted to [facility] on 03/15/15 for LE weakness. Per MD note found to have, "an acute sacral stress fracture, large right foraminal disc protrusion at L4-L5".  Pt with significant PMhx of PAF, CKD III, atrial tachycardia, hypertrophic cardiomyopathy, anemia of chronic disease, low back pain, and upper GIB.    Clinical Impression  Pt is generally weak and deconditioned.  She needs assist to get up out of lower chairs and is moderate to high risk of falls.  I recommended she start using a RW full time to help support her weak legs.  She would beneift from short term rehab at discharge.   PT to follow acutely for deficits listed below.       Follow Up Recommendations SNF    Equipment Recommendations  None recommended by PT    Recommendations for Other Services   NA    Precautions / Restrictions Precautions Precautions: Fall Precaution Comments: Pt is generally weak on her feet      Mobility  Bed Mobility Overal bed mobility: Needs Assistance Bed Mobility: Rolling;Sidelying to Sit Rolling: Min guard Sidelying to sit: Min guard;HOB elevated       General bed mobility comments: Min guard assist to help pt position for log roll.  Verbal cues for log roll to help with comfort during bed mobility transitions.   Transfers Overall transfer level: Needs assistance Equipment used: Rolling walker (2 wheeled) Transfers: Sit to/from Stand Sit to Stand: Min assist;Mod assist         General transfer comment: Min assist from higher bed, mod assist from lower hallway chair.  Verbal cues for safe hand placement.   Ambulation/Gait Ambulation/Gait assistance: Min guard Ambulation Distance (Feet): 80 Feet (40', seated rest break, 40') Assistive device: Rolling walker (2 wheeled) Gait Pattern/deviations: Step-through  pattern;Shuffle Gait velocity: decreased   General Gait Details: Verbal cues for safe use of RW (closer proximity and stay inside the handle bars).  Pt needed a seated rest break due to fatigue during gait.           Balance Overall balance assessment: Needs assistance Sitting-balance support: Feet supported;No upper extremity supported Sitting balance-Leahy Scale: Good     Standing balance support: Bilateral upper extremity supported;Single extremity supported;No upper extremity supported Standing balance-Leahy Scale: Fair                               Pertinent Vitals/Pain Pain Assessment: No/denies pain    Home Living Family/patient expects to be discharged to:: Private residence Living Arrangements: Alone Available Help at Discharge: Family;Available PRN/intermittently (per pt daughters who live with her work) Type of Home: House Home Access: Stairs to enter Entrance Stairs-Rails: Left Entrance Stairs-Number of Steps: 3 Home Layout: Two level;Able to live on main level with bedroom/bathroom Home Equipment: Dan Humphreys - 2 wheels;Cane - quad;Cane - single point;Bedside commode;Grab bars - tub/shower Additional Comments: Per pt report she takes baths    Prior Function Level of Independence: Independent with assistive device(s)         Comments: per pt report she uses a cane, but has a RW     Hand Dominance   Dominant Hand: Right    Extremity/Trunk Assessment   Upper Extremity Assessment: Overall WFL for tasks assessed           Lower  Extremity Assessment: Generalized weakness (per pt report burning pain into bil legs when her back hurts)      Cervical / Trunk Assessment: Other exceptions  Communication   Communication: No difficulties  Cognition Arousal/Alertness: Awake/alert Behavior During Therapy: WFL for tasks assessed/performed Overall Cognitive Status: Within Functional Limits for tasks assessed (not specifically tested, but  conversation WNL)                         Exercises General Exercises - Lower Extremity Ankle Circles/Pumps: AROM;Both;10 reps;Seated Long Arc Quad: AROM;Both;10 reps;Seated Hip ABduction/ADduction: AROM;Both;10 reps;Seated Hip Flexion/Marching: AROM;Both;10 reps;Seated      Assessment/Plan    PT Assessment Patient needs continued PT services  PT Diagnosis Difficulty walking;Abnormality of gait;Generalized weakness   PT Problem List Decreased strength;Decreased activity tolerance;Decreased balance;Decreased mobility;Decreased knowledge of use of DME;Impaired sensation;Pain  PT Treatment Interventions DME instruction;Stair training;Gait training;Functional mobility training;Therapeutic activities;Balance training;Therapeutic exercise;Neuromuscular re-education;Patient/family education;Modalities   PT Goals (Current goals can be found in the Care Plan section) Acute Rehab PT Goals Patient Stated Goal: to get better, do whatever everyone thinks is best PT Goal Formulation: With patient Time For Goal Achievement: 03/31/15 Potential to Achieve Goals: Good    Frequency Min 3X/week   Barriers to discharge Decreased caregiver support pt is alone during the day       End of Session Equipment Utilized During Treatment: Gait belt Activity Tolerance: Patient limited by fatigue Patient left: in chair;with call bell/phone within reach Nurse Communication: Mobility status         Time: 1121-1208 PT Time Calculation (min) (ACUTE ONLY): 47 min   Charges:   PT Evaluation $PT Eval Moderate Complexity: 1 Procedure PT Treatments $Gait Training: 8-22 mins $Therapeutic Exercise: 8-22 mins        Geovanni Rahming B. Glennice Marcos, PT, DPT [phone]   03/17/2015, 12:27 PM

## 2015-03-17 NOTE — Clinical Social Work Note (Signed)
CSW received consult for skilled facility placement for patient. Assessment completed (full assessment to follow) and facility search initiated.  Hagan Vanauken, MSW, LCSW Licensed Clinical Social Worker Clinical Social Work Department [place] [phone] 

## 2015-03-17 NOTE — Discharge Summary (Signed)
Physician Discharge Summary  Eileen Santana LDJ:[id number] DOB: 06-29-1930 DOA: 03/15/2015  PCP: [name], MD  Admit date: 03/15/2015 Discharge date: 03/18/2015  Time spent: 35 minutes  Recommendations for Outpatient Follow-up:  1. She will need to skilled nursing rehabilitation. 2. Only to follow-up with orthopedic as an outpatient for back pain.   Discharge Diagnoses:  Active Problems:   Bilateral leg weakness   HTN (hypertension), benign   Thrombocytopenia (HCC)   Rheumatoid arthritis (HCC)   Anemia of chronic disease   Atrial flutter (HCC)   COPD (chronic obstructive pulmonary disease) (HCC)   Chronic renal disease, stage 3, moderately decreased glomerular filtration rate between 30-59 mL/min/1.73 square meter   Sacral fracture, closed [facility])   Discharge Condition: stable  Diet recommendation: regular  Filed Weights   03/15/15 0925 03/15/15 1624  Weight: 69.4 kg (153 lb) 69.219 kg (152 lb 9.6 oz)    History of present illness:  82 year old female with past medical history of DJD, gastric ulcer chronic kidney disease autoimmune hepatitis sciatica and chronic back pain that comes in with bilateral lower extremity weakness and difficulty ambulating after the patient tripped and fell on her sacrum.  Hospital Course:  Bilateral lower extremity weakness/chronic back pain: A CT scan of the lumbar spine was done with results as below, an MRI of the lumbar spine show a stress sacral fracture. She now focal deficit on neurological exam. It was discussed with neurosurgery who recommended no surgical intervention at this time. She was started on MiraLAX and give her a tap water enema. Physical therapy was consulted and evaluated the patient and recommended skilled nursing facility. She will go home on narcotics for pain controlled.  Essential hypertension: No changes were made to her regimen.  Chronic thrombocytopenia: She will need to follow-up with her PCP as an  outpatient.  Anemia of chronic disease Seems to be at baseline.  Atrial flutter (HCC) Rate control continue aspirin.  COPD (chronic obstructive pulmonary disease) (HCC): No evidence of exacerbation. Continue bronchodilators as needed.   Chronic renal disease, stage 3, moderately decreased glomerular filtration rate between 30-59 mL/min/1.73 square meter Appears to be at baseline.  Procedures:  CT head  CT lumbar spine  MRI lumbar spine  Consultations:  none  Discharge Exam: Filed Vitals:   03/18/15 0523 03/18/15 1025  BP: 103/66 117/70  Pulse: 117 130  Temp: 98.2 F (36.8 C)   Resp: 19     General: A&O x3 Cardiovascular: RRR Respiratory: good air movement CTA B/L  Discharge Instructions   Discharge Instructions    Diet - low sodium heart healthy    Complete by:  As directed      Diet - low sodium heart healthy    Complete by:  As directed      Increase activity slowly    Complete by:  As directed      Increase activity slowly    Complete by:  As directed           Current Discharge Medication List    START taking these medications   Details  polyethylene glycol (MIRALAX / GLYCOLAX) packet Take 17 g by mouth daily. Qty: 14 each, Refills: 0      CONTINUE these medications which have CHANGED   Details  oxyCODONE-acetaminophen (PERCOCET/ROXICET) 5-325 MG tablet Take 1-2 tablets by mouth every 4 (four) hours as needed for severe pain. Qty: 12 tablet, Refills: 0      CONTINUE these medications which have NOT CHANGED  Details  acetaminophen (TYLENOL) 325 MG tablet Take 650 mg by mouth every 6 (six) hours as needed. For pain    aspirin 81 MG chewable tablet Chew 2 tablets (162 mg total) by mouth daily.    azaTHIOprine (IMURAN) 50 MG tablet TAKE 1 AND 1/2 TABLETS BY MOUTH ONCE DAILY Qty: 60 tablet, Refills: 4    cetirizine (ZYRTEC) 10 MG tablet Take 10 mg by mouth daily as needed for allergies.    Cholecalciferol (VITAMIN D) 2000 UNITS tablet  Take 2,000 Units by mouth daily.    Coenzyme Q10 200 MG capsule Take 200 mg by mouth daily.    Darbepoetin Alfa (ARANESP) 500 MCG/ML SOSY injection Inject 500 mcg into the skin every 21 ( twenty-one) days.    DEXILANT 60 MG capsule TAKE 1 CAPSULE BY MOUTH EVERY OTHER DAY Qty: 45 capsule, Refills: 3    diltiazem (TIAZAC) 360 MG 24 hr capsule Take 1 capsule (360 mg total) by mouth daily. Qty: 30 capsule, Refills: 1    fish oil-omega-3 fatty acids 1000 MG capsule Take 1 g by mouth daily.   Associated Diagnoses: Pancytopenia; Elevated liver enzymes    fluticasone (FLONASE) 50 MCG/ACT nasal spray Place 2 sprays into both nostrils daily as needed for allergies.     folic acid (FOLVITE) 800 MCG tablet Take 800 mcg by mouth daily.    Associated Diagnoses: Pancytopenia (HCC)    furosemide (LASIX) 20 MG tablet Take 20 mg by mouth daily as needed. For fluid retention   Associated Diagnoses: Anemia; Elevated liver enzymes    lidocaine-prilocaine (EMLA) cream Apply 1 application topically as needed (pain).     Magnesium 400 MG TABS Take 1 tablet by mouth daily.     metoprolol (LOPRESSOR) 50 MG tablet Take 1 tablet 2 times daily on Saturday and Sunday and resume one and a half tablets 2 times daily on Monday 04/06/14.    Multiple Vitamins-Minerals (MULTIVITAMIN WITH MINERALS) tablet Take 1 tablet by mouth daily.     Associated Diagnoses: Pancytopenia    potassium chloride SA (K-DUR,KLOR-CON) 20 MEQ tablet TAKE 1 TABLET BY MOUTH ONCE DAILY Qty: 30 tablet, Refills: 2    predniSONE (DELTASONE) 5 MG tablet Take 7.5 mg by mouth daily with breakfast.     spironolactone (ALDACTONE) 50 MG tablet TAKE 1 TABLET BY MOUTH DAILY Qty: 30 tablet, Refills: 1    Travoprost, BAK Free, (TRAVATAN) 0.004 % SOLN ophthalmic solution Place 1 drop into both eyes at bedtime.    ursodiol (ACTIGALL) 300 MG capsule Take 300 mg by mouth 2 (two) times daily.      STOP taking these medications      Hydrocodone-Acetaminophen 2.5-325 MG TABS        Allergies  Allergen Reactions  . Iohexol Swelling    IV Dye   . Ivp Dye [Iodinated Diagnostic Agents] Swelling    Hives  . Naprosyn [Naproxen] Hives and Itching  . Red Blood Cells Swelling and Dermatitis    With blood transfusion 2012  . Verapamil     Heart block (2nd degree) Takes Cardizem   . Vicodin [Hydrocodone-Acetaminophen] Hives and Itching  . Sulfa Antibiotics Itching and Rash   Follow-up Information    Follow up with [name], MD In 2 weeks.   Specialty:  Family Medicine   Contact information:   [address] [place] [address] [phone]        The results of significant diagnostics from this hospitalization (including imaging, microbiology,  ancillary and laboratory) are listed below for reference.    Significant Diagnostic Studies: Dg Chest 1 View  03/15/2015  CLINICAL DATA:  82 year old female with history of chronic lower back pain. EXAM: CHEST 1 VIEW COMPARISON:  Chest x-ray 03/13/2015. FINDINGS: Mild diffuse peribronchial cuffing. There is cephalization of the pulmonary vasculature and slight indistinctness of the interstitial markings suggestive of mild pulmonary edema. Small left pleural effusion. Moderate cardiomegaly. The patient is rotated to the left on today's exam, resulting in distortion of the mediastinal contours and reduced diagnostic sensitivity and specificity for mediastinal pathology. Atherosclerosis in the thoracic aorta. IMPRESSION: 1. The appearance the chest suggests mild congestive heart failure, as above. 2. Atherosclerosis. Electronically Signed   By: Trudie Reed M.D.   On: 03/15/2015 11:00   Dg Chest 2 View  03/13/2015  CLINICAL DATA:  Cough for 5 days.  Weakness. EXAM: CHEST - 2 VIEW COMPARISON:  Two-view chest x-ray 01/03/2014 FINDINGS: The heart is enlarged. Atherosclerotic changes are present at the aorta. Mild edema is present. Small bilateral pleural effusions are  noted. Mild bibasilar airspace disease likely reflects atelectasis. Vascular calcifications are present in the splenic artery. IMPRESSION: 1. Cardiomegaly and mild edema suggesting congestive heart failure. 2. Small bilateral pleural effusions and associated atelectasis. Electronically Signed   By: Marin Roberts M.D.   On: 03/13/2015 14:01   Ct Head Wo Contrast  03/13/2015  CLINICAL DATA:  Legs are givnig out starting around 11 a.m. today causing patient fall backwards. EXAM: CT HEAD WITHOUT CONTRAST TECHNIQUE: Contiguous axial images were obtained from the base of the skull through the vertex without intravenous contrast. COMPARISON:  MRI 04/03/2014.  CT 04/02/2014. FINDINGS: There is no evidence for acute hemorrhage, hydrocephalus, mass lesion, or abnormal extra-axial fluid collection. No definite CT evidence for acute infarction. Diffuse loss of parenchymal volume is consistent with atrophy. The visualized paranasal sinuses and mastoid air cells are clear. IMPRESSION: 1. No acute intracranial abnormality. 2. Atrophy. Electronically Signed   By: Kennith Center M.D.   On: 03/13/2015 18:09   Ct Lumbar Spine Wo Contrast  03/15/2015  CLINICAL DATA:  82 year old female with leg weakness resulting in her falling backwards into her recliner. EXAM: CT LUMBAR SPINE WITHOUT CONTRAST TECHNIQUE: Multidetector CT imaging of the lumbar spine was performed without intravenous contrast administration. Multiplanar CT image reconstructions were also generated. COMPARISON:  Lumbar spine radiographs 01/31/2015 FINDINGS: Calcified upper abdominal lymph nodes noted incidentally. 4 cm water attenuation cyst in the hilum of the left kidney. Smaller 1.9 cm water attenuation cyst exophytic from the anterior upper pole the left kidney. Atherosclerotic calcifications throughout the abdominal aorta without evidence of aneurysm. The remainder the visualized intra-abdominal contents are unremarkable. No evidence of acute fracture or  malalignment. Vertebral body heights are maintained. Multilevel lower lumbar degenerative disc disease with vacuum disc phenomenon at L4-L5 and L5-S1. There is mild leftward translation of L4 on L5 resulting in mild focal levoconvex scoliosis. Due to a combination of a posterior disc bulge and a peripherally calcified right facet joint synovial cyst versus asymmetric ligamentum flavum hypertrophy there is at least moderate central canal stenosis and severe right lateral recess stenosis at L3-L4. IMPRESSION: 1. No acute fracture or malalignment. 2. Multilevel degenerative disc disease most significant at L3-L4 were there is at least moderate central canal and advanced right lateral recess stenosis secondary to a combination of posterior disc bulge and right facet joint synovial cyst versus asymmetric ligamentum flavum hypertrophy. 3. Degenerative lateral translation to the left of  L4 on L5 resulting in mild focal levoconvex scoliosis. 4. Aortic atherosclerosis. 5. Left renal cysts. Electronically Signed   By: Malachy Moan M.D.   On: 03/15/2015 11:08   Mr Lumbar Spine Wo Contrast  03/15/2015  CLINICAL DATA:  Chronic back pain presenting with bilateral lower extremity weakness and difficulty ambulating. History of autoimmune hepatitis, degenerative joint disease. EXAM: MRI LUMBAR SPINE WITHOUT CONTRAST TECHNIQUE: Multiplanar, multisequence MR imaging of the lumbar spine was performed. No intravenous contrast was administered. COMPARISON:  CT lumbar spine March 15, 2015 at 10:50 a.m. FINDINGS: Lumbar vertebral bodies intact. Using the reference level of the last well-formed intervertebral disc as L5-S1, grade 1 L5-S1 retrolisthesis without spondylolysis. Maintenance of lumbar lordosis. Moderate L4-5 and moderate to severe L5-S1 disc height loss with linear central low signal compatible of vacuum disc. Associated moderate chronic discogenic endplate changes toward the LEFT at L5-S1. Mild chronic discogenic  endplate changes L3-4 and L4-5. Generalized bright T1 bone marrow signal compatible with osteopenia without STIR signal abnormality to suggest acute lumbar spine fracture. However, there is bright STIR signal, low T1 signal within S3 vertebral body with associated with ventral cortical irregularity compatible with acute stress injury. Conus medullaris terminates L1-2 and appears normal morphology and signal characteristics. Central displacement of the cauda equina due to canal stenosis. Moderate to severe symmetric paraspinal muscle atrophy. T2 bright cyst LEFT kidney measuring 4.5 cm. Irregularity of the aorta are without aneurysmal dilatation compatible with atherosclerosis. Level by level evaluation: L1-2: No significant disc bulge, canal stenosis or neural foraminal narrowing. L2-3: Annular bulging, mild facet arthropathy and ligamentum flavum redundancy without canal stenosis. Minimal neural foraminal narrowing. L3-4: 2 mm broad-based disc bulge asymmetric laterally, encroaching upon the exited RIGHT L3 nerve. Moderate to severe RIGHT and mild LEFT facet arthropathy and ligamentum flavum redundancy with superimposed 5 x 9 x 14 mm (transverse by AP by CC) RIGHT facet synovial cyst extending medially into the lateral epidural space, resulting in moderate canal stenosis and RIGHT lateral recess effacement which may affect the traversing RIGHT L4 nerve. Moderate to severe RIGHT, mild LEFT neural foraminal narrowing. L4-5: 5 mm broad-based disc bulge. Severe RIGHT and mild to moderate LEFT facet arthropathy and ligamentum flavum redundancy. Trace RIGHT facet effusion, with sub cm RIGHT facet synovial cyst extending into the paraspinal soft tissues. Mild to moderate canal stenosis with partial effacement lateral recesses which may affect the traversing L5 nerves. Superimposed large RIGHT extra foraminal disc protrusion posteriorly displaces the exited RIGHT L4 nerve, and results in severe RIGHT neural foraminal  narrowing. Mild to moderate LEFT neural foraminal narrowing. L5-S1: Retrolisthesis. 4 mm broad-based disc bulge asymmetric to LEFT may affect the exited LEFT L5 nerve. Mild to moderate facet arthropathy and ligamentum flavum redundancy without canal stenosis. Mild RIGHT, severe LEFT neural foraminal narrowing. Multilevel Tarlov cysts measure up to 14 mm. IMPRESSION: Acute sacral (S3) stress fracture. Degenerative lumbar spine resulting in moderate canal stenosis at L3-4, mild at L4-5. Large RIGHT extra foraminal L4-5 disc protrusion displaces the exited RIGHT L4. Neural foraminal narrowing L2-3 through L5-S1: Severe on the RIGHT at L4-5, severe on the LEFT at L5-S1. No acute lumbar spine fracture. Grade 1 L5-S1 retrolisthesis on degenerative basis. Electronically Signed   By: Awilda Metro M.D.   On: 03/15/2015 22:03    Microbiology: Recent Results (from the past 240 hour(s))  Urine culture     Status: None   Collection Time: 03/13/15  6:25 PM  Result Value Ref Range Status  Specimen Description URINE, CLEAN CATCH  Final   Special Requests NONE  Final   Culture   Final    MULTIPLE SPECIES PRESENT, SUGGEST RECOLLECTION Performed at [facility]    Report Status 03/15/2015 FINAL  Final     Labs: Basic Metabolic Panel:  Recent Labs Lab 03/13/15 1429 03/15/15 1032 03/16/15 0643  NA 135 135 137  K 4.0 4.1 5.0  CL 100* 100* 103  CO2 25 27 22   GLUCOSE 182* 147* 157*  BUN 17 21* 20  CREATININE 1.24* 1.27* 1.51*  CALCIUM 10.1 9.5 9.8   Liver Function Tests:  Recent Labs Lab 03/15/15 1032  AST 283*  ALT 21  ALKPHOS 55  BILITOT 1.7*  PROT 5.7*  ALBUMIN 2.5*   No results for input(s): LIPASE, AMYLASE in the last 168 hours.  Recent Labs Lab 03/15/15 1032  AMMONIA 31   CBC:  Recent Labs Lab 03/13/15 1429 03/15/15 1032 03/16/15 0643  WBC 4.7 4.1 3.5*  NEUTROABS  --  3.3  --   HGB 13.6 13.2 13.6  HCT 39.7 38.2 39.6  MCV 83.9 83.2 82.7  PLT 72* 63* 79*    Cardiac Enzymes:  Recent Labs Lab 03/13/15 1429  TROPONINI 0.03   BNP: BNP (last 3 results)  Recent Labs  03/13/15 1429  BNP 426.0*    ProBNP (last 3 results) No results for input(s): PROBNP in the last 8760 hours.  CBG: No results for input(s): GLUCAP in the last 168 hours.   Signed:  Marinda Elk MD.  [facility] 03/18/2015, 10:52 AM

## 2015-03-17 NOTE — NC FL2 (Signed)
[place] MEDICAID FL2 LEVEL OF CARE SCREENING TOOL     IDENTIFICATION  Patient Name: Eileen Santana Birthdate: [birth year] Sex: female Admission Date (Current Location): 03/15/2015  [place] and [place] Number:  [name] [id number] K Facility and Address:  [facility], [address] [place], [place] [address]      Provider Number: [id number]  Attending Physician Name and Address:  Marinda Elk, MD  Relative Name and Phone Number:  Selina Tapper - daughter. Phone #[phone] (mobile)    Current Level of Care: Hospital Recommended Level of Care: Skilled Nursing Facility Prior Approval Number:    Date Approved/Denied:  Dolores Hoose. 03/17/15) PASRR Number: [id number] (Eff. 03/17/15)  Discharge Plan: SNF    Current Diagnoses: Patient Active Problem List   Diagnosis Date Noted  . Sacral fracture, closed (HCC) 03/16/2015  . Bilateral leg weakness 03/15/2015  . Blood loss anemia 07/18/2014  . 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 Biatrial enlargement 04/04/2014  . TIA (transient ischemic attack) 04/03/2014  . Adrenal insufficiency (HCC) 04/03/2014  . Aphasia 04/03/2014  . Chronic renal disease, stage 3, moderately decreased glomerular filtration rate between 30-59 mL/min/1.73 square meter 02/03/2014  . Anemia of chronic renal failure, stage 3 (moderate) 02/03/2014  . Atrial flutter with rapid ventricular response (HCC) 01/02/2014  . Atrial fibrillation with rapid ventricular response (HCC)   . Atrial fibrillation with RVR (HCC) 12/29/2013  . Healthcare-associated pneumonia 12/29/2013  . COPD (chronic obstructive pulmonary disease) (HCC) 12/29/2013  . HCAP (healthcare-associated pneumonia) 12/29/2013  . Atrial flutter (HCC) 06/12/2013  . Atrial tachycardia (HCC) 06/11/2013  . AVM (congenital arteriovenous malformation) 10/12/2012  . Hypersplenism 09/21/2012  . Iron deficiency anemia due to chronic blood loss 09/21/2012  . PAT (paroxysmal  atrial tachycardia) (HCC) 05/23/2012  . Hypertrophic cardiomyopathy (HCC) 05/23/2012  . Anemia of chronic disease 11/25/2011  . UGI bleed 09/20/2011  . Gastric AVM 09/20/2011  . Candida esophagitis (HCC) 09/20/2011  . Chronic back pain 09/18/2011  . Leg pain 09/18/2011  . Rheumatoid arthritis (HCC) 09/18/2011  . Acute renal insufficiency 09/18/2011  . Hypoalbuminemia 09/18/2011  . Guaiac positive stools 09/18/2011  . Renal calculus 08/12/2011  . Abdominal pain, acute, generalized 08/10/2011  . Nausea 08/10/2011  . Diarrhea 08/10/2011  . Hypomagnesemia 08/10/2011  . Hypokalemia 08/10/2011  . Prolonged Q-T interval on ECG 08/10/2011  . UTI (urinary tract infection) 08/10/2011  . Gallstones 08/10/2011  . Thrombocytopenia (HCC) 08/10/2011  . Orthostatic hypotension 03/05/2011  . Bradycardia 03/05/2011  . Renal failure 03/05/2011  . Autoimmune hepatitis (HCC) 03/05/2011  . HTN (hypertension), benign 03/05/2011  . Elevated liver enzymes 02/17/2011  . GI bleed 02/17/2011  . Microcytic anemia 02/17/2011  . Other pancytopenia (HCC) 09/03/2010  . Sedimentation rate elevation 09/03/2010    Orientation RESPIRATION BLADDER Height & Weight     Self, Time, Situation, Place  Normal Continent Weight: 152 lb 9.6 oz (69.219 kg) Height:  5\' 5"  (165.1 cm)  BEHAVIORAL SYMPTOMS/MOOD NEUROLOGICAL BOWEL NUTRITION STATUS      Continent Diet (Low sodium - Heart Healthy)  AMBULATORY STATUS COMMUNICATION OF NEEDS Skin   Limited Assist (Ambulation min guard per physical therapist) Verbally Normal                       Personal Care Assistance Level of Assistance  Bathing, Feeding, Dressing Bathing Assistance: Limited assistance Feeding assistance: Independent Dressing Assistance: Limited assistance     Functional Limitations Info  Sight, Hearing, Speech Sight Info: Impaired Hearing  Info: Adequate Speech Info: Adequate (Delayed speech)    SPECIAL CARE FACTORS FREQUENCY  PT (By  licensed PT)     PT Frequency: Evaluated 3/14. Minimum of 3 times per week therapy recommended              Contractures Contractures Info: Not present    Additional Factors Info  Code Status, Allergies Code Status Info: Full code Allergies Info: Iohexol, Ivp Dye, Naprosyn, Red blood cells, Verapamil, Vicodin, Sulfa antibiotics           Current Medications (03/17/2015):  This is the current hospital active medication list Current Facility-Administered Medications  Medication Dose Route Frequency Provider Last Rate Last Dose  . acetaminophen (TYLENOL) tablet 650 mg  650 mg Oral Q6H PRN Erick Blinks, MD   650 mg at 03/16/15 2321   Or  . acetaminophen (TYLENOL) suppository 650 mg  650 mg Rectal Q6H PRN Erick Blinks, MD      . aspirin chewable tablet 162 mg  162 mg Oral Daily Erick Blinks, MD   162 mg at 03/17/15 0937  . azaTHIOprine (IMURAN) tablet 75 mg  75 mg Oral Daily Erick Blinks, MD   75 mg at 03/17/15 0937  . benzonatate (TESSALON) capsule 100 mg  100 mg Oral TID PRN Houston Siren, MD   100 mg at 03/16/15 1722  . cholecalciferol (VITAMIN D) tablet 2,000 Units  2,000 Units Oral Daily Erick Blinks, MD   2,000 Units at 03/17/15 [phone]  . diltiazem (CARDIZEM CD) 24 hr capsule 360 mg  360 mg Oral Daily Maretta Bees, MD   360 mg at 03/17/15 0807  . docusate sodium (COLACE) capsule 100 mg  100 mg Oral BID Houston Siren, MD   100 mg at 03/17/15 6962  . fluticasone (FLONASE) 50 MCG/ACT nasal spray 2 spray  2 spray Each Nare Daily PRN Erick Blinks, MD      . folic acid (FOLVITE) tablet 1 mg  1 mg Oral Daily Erick Blinks, MD   1 mg at 03/17/15 0937  . latanoprost (XALATAN) 0.005 % ophthalmic solution 1 drop  1 drop Both Eyes QHS Erick Blinks, MD   1 drop at 03/16/15 2100  . lidocaine (LIDODERM) 5 % 1 patch  1 patch Transdermal Q24H Houston Siren, MD   1 patch at 03/16/15 1723  . magnesium oxide (MAG-OX) tablet 400 mg  400 mg Oral Daily Houston Siren, MD   400 mg at 03/17/15 9528  .  metoprolol (LOPRESSOR) tablet 50 mg  50 mg Oral BID Erick Blinks, MD   50 mg at 03/17/15 0808  . oxyCODONE (Oxy IR/ROXICODONE) immediate release tablet 5 mg  5 mg Oral Q4H PRN Marinda Elk, MD   5 mg at 03/17/15 1022  . pantoprazole (PROTONIX) EC tablet 40 mg  40 mg Oral Daily Erick Blinks, MD   40 mg at 03/17/15 0807  . polyethylene glycol (MIRALAX / GLYCOLAX) packet 17 g  17 g Oral Daily Marinda Elk, MD   17 g at 03/17/15 4132  . potassium chloride SA (K-DUR,KLOR-CON) CR tablet 20 mEq  20 mEq Oral Daily Erick Blinks, MD   20 mEq at 03/17/15 0937  . predniSONE (DELTASONE) tablet 7.5 mg  7.5 mg Oral Q breakfast Houston Siren, MD   7.5 mg at 03/17/15 0807  . spironolactone (ALDACTONE) tablet 50 mg  50 mg Oral Daily Erick Blinks, MD   50 mg at 03/17/15 0937  . ursodiol (ACTIGALL) capsule 300 mg  300 mg  Oral BID Houston Siren, MD   300 mg at 03/17/15 9470   Facility-Administered Medications Ordered in Other Encounters  Medication Dose Route Frequency Provider Last Rate Last Dose  . 0.9 %  sodium chloride infusion   Intravenous Continuous Ellouise Newer, PA-C   Stopped at 10/04/13 1230  . diphenhydrAMINE (BENADRYL) capsule 25 mg  25 mg Oral Q6H PRN Glenford Peers, MD   25 mg at 05/13/11 7615     Discharge Medications: Please see discharge summary for a list of discharge medications.  Relevant Imaging Results:  Relevant Lab Results:   Additional Information ss#[id number]  Cristobal Goldmann, LCSW

## 2015-03-18 DIAGNOSIS — S32121A Minimally displaced Zone II fracture of sacrum, initial encounter for closed fracture: Secondary | ICD-10-CM

## 2015-03-18 DIAGNOSIS — R29898 Other symptoms and signs involving the musculoskeletal system: Secondary | ICD-10-CM | POA: Diagnosis not present

## 2015-03-18 DIAGNOSIS — S32139A Unspecified Zone III fracture of sacrum, initial encounter for closed fracture: Secondary | ICD-10-CM | POA: Diagnosis not present

## 2015-03-18 DIAGNOSIS — I10 Essential (primary) hypertension: Secondary | ICD-10-CM | POA: Diagnosis not present

## 2015-03-18 DIAGNOSIS — J438 Other emphysema: Secondary | ICD-10-CM | POA: Diagnosis not present

## 2015-03-18 NOTE — Care Management Important Message (Signed)
Important Message  Patient Details  Name: Eileen Santana MRN: [id number] Date of Birth: 21-Jun-1930   Medicare Important Message Given:  Yes    Sicily Zaragoza P Yamilet Mcfayden 03/18/2015, 11:11 AM

## 2015-03-18 NOTE — Progress Notes (Signed)
[facility] PROGRESS NOTE    Progress Note   Eileen Santana TKZ:[id number] DOB: October 25, 1930 DOA: 03/15/2015 PCP: [name], MD   Brief Narrative:   Eileen Santana is an 82 y.o. female 82 y.o. female with a hx of degenerative joint disease, HTN, gastric ulcers, CKD, autoimmune hepatitis, sciatica, and chronic back pain presents with bilateral LE weakness (R>L) and difficulty ambulating. Per family at bedside, over the last 2 days they have been noticing increasing weakness involving the BLE and inability to ambulate.  Assessment/Plan:   Bilateral leg weakness/chronic back pain: MRI of the lumbar spine and sacrum shows an acute sacral stress fracture, large right foraminal disc protrusion at L4-L5. Pain controlled, Change narcotics to orals, we'll start her on MiraLAX daily. Her neurological exam is completely intact she is having regular bowel movements and has control over bladder. Awaiting PT evaluation.  Essential  HTN (hypertension), benign: Blood pressure seems to be control continue current regimen.  Chronic Thrombocytopenia (HCC) Has remained stable follow-up with PCP as an outpatient.  Rheumatoid arthritis (HCC) Continue current regimen.  Anemia of chronic disease Seems to be at baseline.  Atrial flutter (HCC) Rate control continue aspirin.  COPD (chronic obstructive pulmonary disease) (HCC): No evidence of exacerbation. Continue bronchodilators as needed.   Chronic renal disease, stage 3, moderately decreased glomerular filtration rate between 30-59 mL/min/1.73 square meter Appears to be at baseline.    DVT Prophylaxis - SCD's  Family Communication: daughter Disposition Plan: Home 1-2 days Code Status:     Code Status Orders        Start     Ordered   03/15/15 1614  Full code   Continuous     03/15/15 1613    Code Status History    Date Active Date Inactive Code Status Order ID Comments User Context   07/18/2014  3:39 PM 07/19/2014  2:24 PM  Full Code [id number]  Leatha Gilding, MD ED   04/03/2014  2:48 AM 04/04/2014  5:43 PM Full Code [id number]  Houston Siren, MD Inpatient   12/29/2013  4:29 PM 01/06/2014  7:35 PM Full Code [id number]  Standley Brooking, MD ED   06/11/2013  7:46 PM 06/13/2013  4:18 PM Full Code [id number]  Wilson Singer, MD Inpatient   09/18/2011  1:53 PM 09/20/2011 10:46 PM Full Code [id number]  Elliot Cousin, MD ED   08/10/2011  2:59 PM 08/12/2011  1:57 PM Full Code [id number]  Elliot Cousin, MD ED    Advance Directive Documentation        Most Recent Value   Type of Advance Directive  Healthcare Power of Attorney, Living will   Pre-existing out of facility DNR order (yellow form or pink MOST form)     "MOST" Form in Place?          IV Access:    Peripheral IV   Procedures and diagnostic studies:   No results found.   Medical Consultants:    None.  Anti-Infectives:   Anti-infectives    None      Subjective:    Eileen Santana she relates her pain is better  Objective:    Filed Vitals:   03/17/15 1340 03/17/15 2125 03/18/15 0523 03/18/15 1025  BP: 98/75 124/90 103/66 117/70  Pulse: 46 100 117 130  Temp: 98 F (36.7 C) 98.2 F (36.8 C) 98.2 F (36.8 C)   TempSrc: Oral Oral Oral   Resp: 18 19 19    Height:      Weight:  SpO2: 100% 97% 95%     Intake/Output Summary (Last 24 hours) at 03/18/15 1054 Last data filed at 03/18/15 0856  Gross per 24 hour  Intake    240 ml  Output      0 ml  Net    240 ml   Filed Weights   03/15/15 0925 03/15/15 1624  Weight: 69.4 kg (153 lb) 69.219 kg (152 lb 9.6 oz)    Exam: Gen:  NAD Cardiovascular:  RRR. Chest and lungs:   CTAB Abdomen:  Abdomen soft, NT/ND, + BS Extremities:  No edema   Data Reviewed:    Labs: Basic Metabolic Panel:  Recent Labs Lab 03/13/15 1429 03/15/15 1032 03/16/15 0643  NA 135 135 137  K 4.0 4.1 5.0  CL 100* 100* 103  CO2 GLUCOSE 182* 147* 157*  BUN 17 21* 20  CREATININE 1.24* 1.27* 1.51*    CALCIUM 10.1 9.5 9.8   GFR Estimated Creatinine Clearance: 28.1 mL/min (by C-G formula based on Cr of 1.51). Liver Function Tests:  Recent Labs Lab 03/15/15 1032  AST 283*  ALT 21  ALKPHOS 55  BILITOT 1.7*  PROT 5.7*  ALBUMIN 2.5*   No results for input(s): LIPASE, AMYLASE in the last 168 hours.  Recent Labs Lab 03/15/15 1032  AMMONIA 31   Coagulation profile No results for input(s): INR, PROTIME in the last 168 hours.  CBC:  Recent Labs Lab 03/13/15 1429 03/15/15 1032 03/16/15 0643  WBC 4.7 4.1 3.5*  NEUTROABS  --  3.3  --   HGB 13.6 13.2 13.6  HCT 39.7 38.2 39.6  MCV 83.9 83.2 82.7  PLT 72* 63* 79*   Cardiac Enzymes:  Recent Labs Lab 03/13/15 1429  TROPONINI 0.03   BNP (last 3 results) No results for input(s): PROBNP in the last 8760 hours. CBG: No results for input(s): GLUCAP in the last 168 hours. D-Dimer: No results for input(s): DDIMER in the last 72 hours. Hgb A1c: No results for input(s): HGBA1C in the last 72 hours. Lipid Profile: No results for input(s): CHOL, HDL, LDLCALC, TRIG, CHOLHDL, LDLDIRECT in the last 72 hours. Thyroid function studies:  Recent Labs  03/15/15 1908  TSH 0.880   Anemia work up: No results for input(s): VITAMINB12, FOLATE, FERRITIN, TIBC, IRON, RETICCTPCT in the last 72 hours. Sepsis Labs:  Recent Labs Lab 03/13/15 1429 03/13/15 1431 03/13/15 1710 03/15/15 1032 03/16/15 0643  WBC 4.7  --   --  4.1 3.5*  LATICACIDVEN  --  2.7* 2.9*  --   --    Microbiology Recent Results (from the past 240 hour(s))  Urine culture     Status: None   Collection Time: 03/13/15  6:25 PM  Result Value Ref Range Status   Specimen Description URINE, CLEAN CATCH  Final   Special Requests NONE  Final   Culture   Final    MULTIPLE SPECIES PRESENT, SUGGEST RECOLLECTION Performed at [facility]    Report Status 03/15/2015 FINAL  Final     Medications:   . aspirin  162 mg Oral Daily  . azaTHIOprine  75 mg Oral  Daily  . cholecalciferol  2,000 Units Oral Daily  . diltiazem  360 mg Oral Daily  . docusate sodium  100 mg Oral BID  . folic acid  1 mg Oral Daily  . latanoprost  1 drop Both Eyes QHS  . lidocaine  1 patch Transdermal Q24H  . magnesium oxide  400 mg Oral Daily  .  metoprolol  50 mg Oral BID  . pantoprazole  40 mg Oral Daily  . polyethylene glycol  17 g Oral Daily  . potassium chloride SA  20 mEq Oral Daily  . predniSONE  7.5 mg Oral Q breakfast  . spironolactone  50 mg Oral Daily  . ursodiol  300 mg Oral BID   Continuous Infusions:   Time spent: 25 min   LOS: 3 days   Marinda Elk  [facility] Pager [phone]  *Please refer to [email or web address], password [email or web address] to get updated schedule on who will round on this patient, as hospitalists switch teams weekly. If 7PM-7AM, please contact night-coverage at [email or web address], password [email or web address] for any overnight needs.  03/18/2015, 10:54 AM

## 2015-03-18 NOTE — Progress Notes (Signed)
Pt discharged to home.  Discharge instructions explained to pt.  Pt has no questions at the time of discharge.  IV removed.  Pt taken off the unit via volunteers .

## 2015-03-18 NOTE — Care Management Obs Status (Addendum)
MEDICARE OBSERVATION STATUS NOTIFICATION   Patient Details  Name: Zarielle Cea MRN: [id number] Date of Birth: 1930-04-16   Medicare Observation Status Notification Given:  Yes (treated PO meds )  Explained observation letter to patient , offered to call one of her daughters. Patient states [name] is on her way to the hospital and to wait for her arrival.   Explained observation Notice to patient's daughter [name] at bedside .  Cathy signed letter.  Kingsley Plan, RN 03/18/2015, 8:44 AM

## 2015-03-18 NOTE — Care Management Note (Signed)
Case Management Note  Patient Details  Name: Shreshta Medley MRN: [id number] Date of Birth: August 04, 1930  Subjective/Objective:                    Action/Plan:  Spoke with patient again and [name] ( daughter ) at bedside , they want to continue using [facility] for home health services  Expected Discharge Date:                  Expected Discharge Plan:  Home w Home Health Services  In-House Referral:     Discharge planning Services  CM Consult  Post Acute Care Choice:  Home Health Choice offered to:  Patient, Adult Children  DME Arranged:    DME Agency:     HH Arranged:  RN, PT, OT, Nurse's Aide, Social Work [employer] Agency:  [facility]  Status of Service:  Completed, signed off  Medicare Important Message Given:    Date Medicare IM Given:    Medicare IM give by:    Date Additional Medicare IM Given:    Additional Medicare Important Message give by:     If discussed at Long Length of Stay Meetings, dates discussed:    Additional Comments:  Kingsley Plan, RN 03/18/2015, 9:51 AM

## 2015-03-20 NOTE — Progress Notes (Signed)
   03/17/15 1208  PT G-Codes **NOT FOR INPATIENT CLASS**  Functional Assessment Tool Used assist level  Functional Limitation Mobility: Walking and moving around  Mobility: Walking and Moving Around Current Status (Q9826) CJ  Mobility: Walking and Moving Around Goal Status (E1583) CI  04/06/2015 late entry g-codes Jachai Okazaki B. Jamillia Closson, PT, DPT [phone]

## 2015-05-04 ENCOUNTER — Encounter (HOSPITAL_COMMUNITY): Payer: Medicare Other | Attending: Oncology | Admitting: Hematology & Oncology

## 2015-05-04 ENCOUNTER — Ambulatory Visit (HOSPITAL_COMMUNITY): Payer: Medicare Other | Admitting: Hematology & Oncology

## 2015-05-04 VITALS — BP 110/69 | HR 93 | Temp 98.5°F | Resp 18 | Wt 150.4 lb

## 2015-05-04 DIAGNOSIS — Z8719 Personal history of other diseases of the digestive system: Secondary | ICD-10-CM | POA: Insufficient documentation

## 2015-05-04 DIAGNOSIS — N183 Chronic kidney disease, stage 3 unspecified: Secondary | ICD-10-CM

## 2015-05-04 DIAGNOSIS — D638 Anemia in other chronic diseases classified elsewhere: Secondary | ICD-10-CM | POA: Diagnosis not present

## 2015-05-04 DIAGNOSIS — D500 Iron deficiency anemia secondary to blood loss (chronic): Secondary | ICD-10-CM

## 2015-05-04 DIAGNOSIS — Z79899 Other long term (current) drug therapy: Secondary | ICD-10-CM | POA: Insufficient documentation

## 2015-05-04 DIAGNOSIS — D631 Anemia in chronic kidney disease: Secondary | ICD-10-CM

## 2015-05-04 DIAGNOSIS — I480 Paroxysmal atrial fibrillation: Secondary | ICD-10-CM | POA: Insufficient documentation

## 2015-05-04 DIAGNOSIS — Q2733 Arteriovenous malformation of digestive system vessel: Secondary | ICD-10-CM

## 2015-05-04 DIAGNOSIS — M545 Low back pain: Secondary | ICD-10-CM | POA: Insufficient documentation

## 2015-05-04 DIAGNOSIS — K922 Gastrointestinal hemorrhage, unspecified: Secondary | ICD-10-CM

## 2015-05-04 DIAGNOSIS — D731 Hypersplenism: Secondary | ICD-10-CM | POA: Diagnosis not present

## 2015-05-04 DIAGNOSIS — Z8673 Personal history of transient ischemic attack (TIA), and cerebral infarction without residual deficits: Secondary | ICD-10-CM | POA: Insufficient documentation

## 2015-05-04 DIAGNOSIS — I129 Hypertensive chronic kidney disease with stage 1 through stage 4 chronic kidney disease, or unspecified chronic kidney disease: Secondary | ICD-10-CM | POA: Insufficient documentation

## 2015-05-04 DIAGNOSIS — Z9889 Other specified postprocedural states: Secondary | ICD-10-CM | POA: Insufficient documentation

## 2015-05-04 DIAGNOSIS — K31819 Angiodysplasia of stomach and duodenum without bleeding: Secondary | ICD-10-CM

## 2015-05-04 DIAGNOSIS — D509 Iron deficiency anemia, unspecified: Secondary | ICD-10-CM

## 2015-05-04 DIAGNOSIS — D696 Thrombocytopenia, unspecified: Secondary | ICD-10-CM | POA: Diagnosis not present

## 2015-05-04 DIAGNOSIS — D61818 Other pancytopenia: Secondary | ICD-10-CM

## 2015-05-04 DIAGNOSIS — M069 Rheumatoid arthritis, unspecified: Secondary | ICD-10-CM | POA: Diagnosis not present

## 2015-05-04 DIAGNOSIS — Z8701 Personal history of pneumonia (recurrent): Secondary | ICD-10-CM | POA: Insufficient documentation

## 2015-05-04 DIAGNOSIS — Z7982 Long term (current) use of aspirin: Secondary | ICD-10-CM | POA: Insufficient documentation

## 2015-05-04 DIAGNOSIS — I422 Other hypertrophic cardiomyopathy: Secondary | ICD-10-CM | POA: Insufficient documentation

## 2015-05-04 DIAGNOSIS — Z882 Allergy status to sulfonamides status: Secondary | ICD-10-CM | POA: Insufficient documentation

## 2015-05-04 DIAGNOSIS — Z87442 Personal history of urinary calculi: Secondary | ICD-10-CM | POA: Insufficient documentation

## 2015-05-04 NOTE — Progress Notes (Signed)
Eileen Font, MD [address] [place] [address]  Chronic anemia Hemoccult positive Stool History of PUD, ulcer at the ileocecal valve History of Gastric AVM CVA's Autoimmune Hepatitis Thrombocytopenia Rheumatoid arthritis Stage III CKD Pillcam 07/15/2014 with single gastric and multiple proximal jejunal AVM's, mild portal gastropathy   CURRENT THERAPY: Intermittent IV Ferahame PRN. ESA therapy  INTERVAL HISTORY: Eileen Santana 82 y.o. female returns for followup of anemia of chronic disease with rheumatoid arthritis, anemia of chronic renal disease, and iron deficiency with blood loss secondary to intestinal AV malformations, chronic kidney disease, hypersplenism  Hematologically, she denies any complaints and ROS questioning is negative.   Eileen Santana was here alone today and was in a wheelchair.  She was in the hospital recently because she fractured her sacrum. She was letting her dogs out and turned too quick and thought she was going to fall. After this she said she sat down in her chair too hard and fractured her sacrum. Her doctor in [place] said it had to heal on its own. Her doctor gave her a shot the last of March and this really helped her pain. He does not want to operate on her. She goes back on Friday and is going to get 2 shots on different sides.   She went to see Dr. Berdine Addison on April 5th. She had blood work done here and this was sent to [facility]. Dr. Berdine Addison told her that her blood was good and that her hemoglobin count was 12.3.  She continues to follow with Dr. Laural Golden.  She went to see her heart doctor last December. She goes back this December.   She has no other complaints or concerns today.   Past Medical History  Diagnosis Date  . History of GI bleed     Associated with NSAIDS  . Iron deficiency anemia     Transfusion dependent  . DJD (degenerative joint disease)   . Essential hypertension, benign   . History of colitis   .  Ileitis   . Pancytopenia   . 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 Heart block AV second degree March 2013    a. Cardiology consult note 06/2013: "Question of previous second degree heart block, although review of cardiology notes indicates that this may have been actually blocked PACs when the patient was on verapamil."  . Bronchitis   . Steroid-induced myopathy   . Renal calculus 08/12/2011  . Rheumatoid arthritis(714.0)   . Colon ulcer 04/2010    NSAID related  . Gastric ulcer 04/2010    NSAID related  . UGI bleed 09/20/2011    Focal area of gastritis oozing of blood  . Gastric AVM 09/20/2011  . Candida esophagitis ([place]) 09/20/2011  . Paroxysmal atrial fibrillation ([facility])     Not on anticoag 2/2 hx of GIB/AVM  . CKD (chronic kidney disease), stage III   . Cholestatic hepatitis   . UTI (lower urinary tract infection)     history  . Paroxysmal atrial flutter ([place])     Not on anticoag 2/2 hx of GIB/AVM - dx 06/2013.  . Multifocal atrial tachycardia ([place])   . Hypertrophic cardiomyopathy ([place])     Echo 03/2011: severe LVH suggesting possble infiltrate cardiomyopathy or advanced hypertensive heart disease, increased  echogenicity of the ventricular septum as well as the pericardium, EF >54% with end systolicmid-cavitary obliteration of the ventricle, stage 1 DD, mild MR, small  IVC.  . Hypomagnesemia   . Chronic renal disease, stage 3, moderately decreased glomerular filtration rate between 30-59 mL/min/1.73 square meter 02/03/2014  . Anemia of chronic disease 11/25/2011  . Anemia of chronic renal failure, stage 3 (moderate) 02/03/2014  . Low back pain   . Stroke, acute, embolic ([place]) 02/09/7410  . Biatrial enlargement 04/04/2014  . History of pneumonia 12/2013  . Atrial fibrillation [facility])     has Other pancytopenia ([place]); Sedimentation rate elevation; Elevated liver enzymes; GI bleed; Microcytic anemia; Orthostatic hypotension; Bradycardia; Renal failure; Autoimmune  hepatitis ([place]); HTN (hypertension), benign; Abdominal pain, acute, generalized; Nausea; Diarrhea; Hypomagnesemia; Hypokalemia; Prolonged Q-T interval on ECG; UTI (urinary tract infection); Gallstones; Thrombocytopenia ([place]); Renal calculus; Chronic back pain; Leg pain; Rheumatoid arthritis ([place]); Acute renal insufficiency; Hypoalbuminemia; Guaiac positive stools; UGI bleed; Gastric AVM; Candida esophagitis ([place]); Anemia of chronic disease; PAT (paroxysmal atrial tachycardia) ([place]); Hypertrophic cardiomyopathy ([place]); Hypersplenism; Iron deficiency anemia due to chronic blood loss; AVM (congenital arteriovenous malformation); Atrial tachycardia ([place]); Atrial flutter ([place]); Atrial fibrillation with RVR ([place]); Healthcare-associated pneumonia; COPD (chronic obstructive pulmonary disease) ([place]); HCAP (healthcare-associated pneumonia); Atrial fibrillation with rapid ventricular response ([place]); Atrial flutter with rapid ventricular response ([place]); Chronic renal disease, stage 3, moderately decreased glomerular filtration rate between 30-59 mL/min/1.73 square meter; Anemia of chronic renal failure, stage 3 (moderate); TIA (transient ischemic attack); Adrenal insufficiency ([place]); Aphasia; Stroke, acute, embolic ([place]); Biatrial enlargement; Blood loss anemia; Bilateral leg weakness; Sacral fracture, closed ([place]); and Weakness of both lower extremities on her problem list.     is allergic to iohexol; ivp dye; naprosyn; red blood cells; verapamil; vicodin; and sulfa antibiotics.  Eileen Santana does not currently have medications on file.  Past Surgical History  Procedure Laterality Date  . Colonoscopy  04/2010  . Upper gastrointestinal endoscopy  04/2010  . Esophagogastroduodenoscopy  09/20/2011    Status post APC.  Marland Kitchen Esophagogastroduodenoscopy  09/20/2011    Procedure: ESOPHAGOGASTRODUODENOSCOPY (EGD);  Surgeon: Rogene Houston, MD;  Location[facility];  Service: Endoscopy;  Laterality: N/A;  . Cyst removal hand       Elbow  . Sebaceous cyst removed      bilateral elbows  . Colonoscopy N/A 07/04/2014    Procedure: COLONOSCOPY;  Surgeon: Rogene Houston, MD;  Location: [facility] END[facility];  Service: Endoscopy;  Laterality: N/A;  730  . Esophagogastroduodenoscopy N/A 07/04/2014    Procedure: ESOPHAGOGASTRODUODENOSCOPY (EGD);  Surgeon: Rogene Houston, MD;  Location: [facility];  Service: Endoscopy;  Laterality: N/A;  . Givens capsule study N/A 07/14/2014    Procedure: GIVENS CAPSULE STUDY;  Surgeon: Rogene Houston, MD;  Location: [facility];  Service: Endoscopy;  Laterality: N/A;  730   Positive for Hip pain. Hurts from her right hip down to her feet.   Denies any headaches, dizziness, double vision, fevers, chills, night sweats, nausea, vomiting, diarrhea, constipation, chest pain, heart palpitations, shortness of breath, blood in stool, black tarry stool, urinary pain, urinary burning, urinary frequency, hematuria. 14 point review of systems was performed and is negative except as detailed under history of present illness and above    PHYSICAL EXAMINATION  ECOG PERFORMANCE STATUS: 2 - Symptomatic, <50% confined to bed  Filed Vitals:   05/04/15 1237  BP: 110/69  Pulse: 93  Temp: 98.5 F (36.9 C)  Resp: 18    GENERAL:alert, no distress, well nourished, well developed, comfortable, cooperative. Wears glasses. In wheelchair SKIN: skin color, texture, turgor are normal, no rashes or significant  lesions HEAD: Normocephalic, No masses, lesions, tenderness or abnormalities EYES: normal, PERRLA, EOMI, Conjunctiva are pink and non-injected EARS: External ears normal OROPHARYNX:lips, buccal mucosa, and tongue normal and mucous membranes are moist  NECK: supple, no adenopathy, thyroid normal size, non-tender, without nodularity, trachea midline LYMPH:  no palpable lymphadenopathy BREAST:not examined LUNGS: clear to auscultation  HEART: regular rate & rhythm, no murmurs and no gallops ABDOMEN:abdomen  soft and normal bowel sounds. NO rebound or guarding. No palpable liver or spleen.  BACK: Back symmetric, no curvature. EXTREMITIES:less then 2 second capillary refill, no joint deformities, effusion, or inflammation, no skin discoloration, no cyanosis  NEURO: alert & oriented x 3 with fluent speech, no focal motor/sensory deficits   LABORATORY DATA: I have reviewed the data as listed.  CBC    Component Value Date/Time   WBC 3.5* 03/16/2015 0643   RBC 4.79 03/16/2015 0643   RBC 3.62* 03/16/2012 0937   HGB 13.6 03/16/2015 0643   HCT 39.6 03/16/2015 0643   PLT 79* 03/16/2015 0643   MCV 82.7 03/16/2015 0643   MCH 28.4 03/16/2015 0643   MCHC 34.3 03/16/2015 0643   RDW 20.9* 03/16/2015 0643   LYMPHSABS 0.4* 03/15/2015 1032   MONOABS 0.4 03/15/2015 1032   EOSABS 0.1 03/15/2015 1032   BASOSABS 0.0 03/15/2015 1032      Chemistry      Component Value Date/Time   NA 137 03/16/2015 0643   K 5.0 03/16/2015 0643   CL 103 03/16/2015 0643   CO2 22 03/16/2015 0643   BUN 20 03/16/2015 0643   CREATININE 1.51* 03/16/2015 0643   CREATININE 1.37* 03/18/2014 1026      Component Value Date/Time   CALCIUM 9.8 03/16/2015 0643   ALKPHOS 55 03/15/2015 1032   AST 283* 03/15/2015 1032   ALT 21 03/15/2015 1032   BILITOT 1.7* 03/15/2015 1032      Lab Results  Component Value Date   IRON 25* 09/24/2013   TIBC 325 09/24/2013   FERRITIN 82 02/09/2015    RADIOGRAPHIC STUDIES: I have personally reviewed the radiological images as listed and agreed with the findings in the report.  Study Result     CLINICAL DATA: Chronic back pain presenting with bilateral lower extremity weakness and difficulty ambulating. History of autoimmune hepatitis, degenerative joint disease.  EXAM: MRI LUMBAR SPINE WITHOUT CONTRAST  TECHNIQUE: Multiplanar, multisequence MR imaging of the lumbar spine was performed. No intravenous contrast was administered.  COMPARISON: CT lumbar spine March 15, 2015  at 10:50 a.m.  FINDINGS: Lumbar vertebral bodies intact. Using the reference level of the last well-formed intervertebral disc as L5-S1, grade 1 L5-S1 retrolisthesis without spondylolysis. Maintenance of lumbar lordosis. Moderate L4-5 and moderate to severe L5-S1 disc height loss with linear central low signal compatible of vacuum disc. Associated moderate chronic discogenic endplate changes toward the LEFT at L5-S1. Mild chronic discogenic endplate changes Z6-1 and L4-5. Generalized bright T1 bone marrow signal compatible with osteopenia without STIR signal abnormality to suggest acute lumbar spine fracture. However, there is bright STIR signal, low T1 signal within S3 vertebral body with associated with ventral cortical irregularity compatible with acute stress injury.  Conus medullaris terminates L1-2 and appears normal morphology and signal characteristics. Central displacement of the cauda equina due to canal stenosis. Moderate to severe symmetric paraspinal muscle atrophy. T2 bright cyst LEFT kidney measuring 4.5 cm. Irregularity of the aorta are without aneurysmal dilatation compatible with atherosclerosis.  Level by level evaluation:  L1-2: No significant disc bulge, canal stenosis  or neural foraminal narrowing.  L2-3: Annular bulging, mild facet arthropathy and ligamentum flavum redundancy without canal stenosis. Minimal neural foraminal narrowing.  L3-4: 2 mm broad-based disc bulge asymmetric laterally, encroaching upon the exited RIGHT L3 nerve. Moderate to severe RIGHT and mild LEFT facet arthropathy and ligamentum flavum redundancy with superimposed 5 x 9 x 14 mm (transverse by AP by CC) RIGHT facet synovial cyst extending medially into the lateral epidural space, resulting in moderate canal stenosis and RIGHT lateral recess effacement which may affect the traversing RIGHT L4 nerve. Moderate to severe RIGHT, mild LEFT neural foraminal narrowing.  L4-5: 5  mm broad-based disc bulge. Severe RIGHT and mild to moderate LEFT facet arthropathy and ligamentum flavum redundancy. Trace RIGHT facet effusion, with sub cm RIGHT facet synovial cyst extending into the paraspinal soft tissues. Mild to moderate canal stenosis with partial effacement lateral recesses which may affect the traversing L5 nerves. Superimposed large RIGHT extra foraminal disc protrusion posteriorly displaces the exited RIGHT L4 nerve, and results in severe RIGHT neural foraminal narrowing. Mild to moderate LEFT neural foraminal narrowing.  L5-S1: Retrolisthesis. 4 mm broad-based disc bulge asymmetric to LEFT may affect the exited LEFT L5 nerve. Mild to moderate facet arthropathy and ligamentum flavum redundancy without canal stenosis. Mild RIGHT, severe LEFT neural foraminal narrowing.  Multilevel Tarlov cysts measure up to 14 mm.  IMPRESSION: Acute sacral (S3) stress fracture.  Degenerative lumbar spine resulting in moderate canal stenosis at L3-4, mild at L4-5.  Large RIGHT extra foraminal L4-5 disc protrusion displaces the exited RIGHT L4. Neural foraminal narrowing L2-3 through L5-S1: Severe on the RIGHT at L4-5, severe on the LEFT at L5-S1.  No acute lumbar spine fracture. Grade 1 L5-S1 retrolisthesis on degenerative basis.   Electronically Signed  By: Elon Alas M.D.  On: 03/15/2015 22:03   Study Result     CLINICAL DATA: 82 year old female with leg weakness resulting in her falling backwards into her recliner.  EXAM: CT LUMBAR SPINE WITHOUT CONTRAST  TECHNIQUE: Multidetector CT imaging of the lumbar spine was performed without intravenous contrast administration. Multiplanar CT image reconstructions were also generated.  COMPARISON: Lumbar spine radiographs 01/31/2015  FINDINGS: Calcified upper abdominal lymph nodes noted incidentally. 4 cm water attenuation cyst in the hilum of the left kidney. Smaller 1.9 cm water  attenuation cyst exophytic from the anterior upper pole the left kidney. Atherosclerotic calcifications throughout the abdominal aorta without evidence of aneurysm. The remainder the visualized intra-abdominal contents are unremarkable.  No evidence of acute fracture or malalignment. Vertebral body heights are maintained. Multilevel lower lumbar degenerative disc disease with vacuum disc phenomenon at L4-L5 and L5-S1. There is mild leftward translation of L4 on L5 resulting in mild focal levoconvex scoliosis. Due to a combination of a posterior disc bulge and a peripherally calcified right facet joint synovial cyst versus asymmetric ligamentum flavum hypertrophy there is at least moderate central canal stenosis and severe right lateral recess stenosis at L3-L4.  IMPRESSION: 1. No acute fracture or malalignment. 2. Multilevel degenerative disc disease most significant at L3-L4 were there is at least moderate central canal and advanced right lateral recess stenosis secondary to a combination of posterior disc bulge and right facet joint synovial cyst versus asymmetric ligamentum flavum hypertrophy. 3. Degenerative lateral translation to the left of L4 on L5 resulting in mild focal levoconvex scoliosis. 4. Aortic atherosclerosis. 5. Left renal cysts.   Electronically Signed  By: Jacqulynn Cadet M.D.  On: 03/15/2015 11:08   Study Result     CLINICAL  DATA: 82 year old female with history of chronic lower back pain.  EXAM: CHEST 1 VIEW  COMPARISON: Chest x-ray 03/13/2015.  FINDINGS: Mild diffuse peribronchial cuffing. There is cephalization of the pulmonary vasculature and slight indistinctness of the interstitial markings suggestive of mild pulmonary edema. Small left pleural effusion. Moderate cardiomegaly. The patient is rotated to the left on today's exam, resulting in distortion of the mediastinal contours and reduced diagnostic sensitivity and specificity  for mediastinal pathology. Atherosclerosis in the thoracic aorta.  IMPRESSION: 1. The appearance the chest suggests mild congestive heart failure, as above. 2. Atherosclerosis.   Electronically Signed  By: Vinnie Langton M.D.  On: 03/15/2015 11:00   Study Result     CLINICAL DATA: Legs are givnig out starting around 11 a.m. today causing patient fall backwards.  EXAM: CT HEAD WITHOUT CONTRAST  TECHNIQUE: Contiguous axial images were obtained from the base of the skull through the vertex without intravenous contrast.  COMPARISON: MRI 04/03/2014. CT 04/02/2014.  FINDINGS: There is no evidence for acute hemorrhage, hydrocephalus, mass lesion, or abnormal extra-axial fluid collection. No definite CT evidence for acute infarction. Diffuse loss of parenchymal volume is consistent with atrophy. The visualized paranasal sinuses and mastoid air cells are clear.  IMPRESSION: 1. No acute intracranial abnormality. 2. Atrophy.   Electronically Signed  By: Misty Stanley M.D.  On: 03/13/2015 18:09   Study Result     CLINICAL DATA: Legs are givnig out starting around 11 a.m. today causing patient fall backwards.  EXAM: CT HEAD WITHOUT CONTRAST  TECHNIQUE: Contiguous axial images were obtained from the base of the skull through the vertex without intravenous contrast.  COMPARISON: MRI 04/03/2014. CT 04/02/2014.  FINDINGS: There is no evidence for acute hemorrhage, hydrocephalus, mass lesion, or abnormal extra-axial fluid collection. No definite CT evidence for acute infarction. Diffuse loss of parenchymal volume is consistent with atrophy. The visualized paranasal sinuses and mastoid air cells are clear.  IMPRESSION: 1. No acute intracranial abnormality. 2. Atrophy.   Electronically Signed  By: Misty Stanley M.D.  On: 03/13/2015 18:09   ASSESSMENT AND PLAN:  Chronic anemia Hemoccult positive Stool History of PUD, ulcer at the  ileocecal valve History of Gastric AVM CVA's Autoimmune Hepatitis Thrombocytopenia Rheumatoid arthritis Stage III CKD History of BMBX in 2012 that was "nonspecific"  THERAPY PLAN:   We will continue to monitor her counts closely. I advised the patient that her anemia is from multiple causes including GI related blood loss, kidney disease, iron deficiency. She has not had a bone marrow biopsy.  She has not required aranesp since late last year. She would however like to follow counts monthly. Last ferritin was 82 ng/ml she received ferric gluconate.  Iron studies will need to continue to be monitored. Iron will be replaced accordingly.   She will return in 3 months for a follow up.  All questions were answered. The patient knows to call the clinic with any problems, questions or concerns. We can certainly see the patient much sooner if necessary.   This document serves as a record of services personally performed by Ancil Linsey, MD. It was created on her behalf by Kandace Blitz, a trained medical scribe. The creation of this record is based on the scribe's personal observations and the provider's statements to them. This document has been checked and approved by the attending provider.  I have reviewed the above documentation for accuracy and completeness, and I agree with the above.  This note is electronically signed.  Kelby Fam. Almeta Geisel,  MD 

## 2015-05-04 NOTE — Patient Instructions (Signed)
[facility] at [facility] Discharge Instructions  RECOMMENDATIONS MADE BY THE CONSULTANT AND ANY TEST RESULTS WILL BE SENT TO YOUR REFERRING PHYSICIAN.   Exam and discussion by Dr Galen Manila today Labs monthly  Return to see the doctor in 3 months Please call the clinic if you have any questions or concerns     Thank you for choosing [facility] at [facility] to provide your oncology and hematology care.  To afford each patient quality time with our provider, please arrive at least 15 minutes before your scheduled appointment time.   Beginning January 23rd 2017 lab work for the [facility] will be done in the  Main lab at [facility] on 1st floor. If you have a lab appointment with the Cancer Center please come in thru the  Main Entrance and check in at the main information desk  You need to re-schedule your appointment should you arrive 10 or more minutes late.  We strive to give you quality time with our providers, and arriving late affects you and other patients whose appointments are after yours.  Also, if you no show three or more times for appointments you may be dismissed from the clinic at the providers discretion.     Again, thank you for choosing [facility].  Our hope is that these requests will decrease the amount of time that you wait before being seen by our physicians.       _____________________________________________________________  Should you have questions after your visit to [facility], please contact our office at [phone] between the hours of 8:30 a.m. and 4:30 p.m.  Voicemails left after 4:30 p.m. will not be returned until the following business day.  For prescription refill requests, have your pharmacy contact our office.         Resources For Cancer Patients and their Caregivers ? American Cancer Society: Can assist with transportation, wigs, general needs, runs Look Good Feel  Better.        347-466-0946 ? Cancer Care: Provides financial assistance, online support groups, medication/co-pay assistance.  1-800-813-HOPE 787-158-4511) ? [facility] Assists [place] cancer patients and their families through emotional , educational and financial support.  3185628899 ? [place] DSS Where to apply for food stamps, Medicaid and utility assistance. 608-667-5076 ? [facility]: Transportation to medical appointments. (289)263-5375 ? Social Security Administration: May apply for disability if have a Stage IV cancer. 412-870-1865 317-844-5208 ? [employer], Disability and Transit Services: Assists with nutrition, care and transit needs. 706-218-5678

## 2015-05-07 ENCOUNTER — Encounter (HOSPITAL_COMMUNITY): Payer: Self-pay | Admitting: Hematology & Oncology

## 2015-05-12 ENCOUNTER — Encounter (INDEPENDENT_AMBULATORY_CARE_PROVIDER_SITE_OTHER): Payer: Self-pay | Admitting: Internal Medicine

## 2015-05-12 ENCOUNTER — Ambulatory Visit (INDEPENDENT_AMBULATORY_CARE_PROVIDER_SITE_OTHER): Payer: Medicare Other | Admitting: Internal Medicine

## 2015-05-12 VITALS — BP 110/94 | HR 102 | Temp 97.3°F | Resp 18 | Ht 66.0 in | Wt 154.0 lb

## 2015-05-12 DIAGNOSIS — K219 Gastro-esophageal reflux disease without esophagitis: Secondary | ICD-10-CM | POA: Diagnosis not present

## 2015-05-12 DIAGNOSIS — K5909 Other constipation: Secondary | ICD-10-CM | POA: Diagnosis not present

## 2015-05-12 DIAGNOSIS — K754 Autoimmune hepatitis: Secondary | ICD-10-CM

## 2015-05-12 MED ORDER — BISACODYL 10 MG RE SUPP: 10.0000 mg | RECTAL | Status: AC | PRN

## 2015-05-12 NOTE — Progress Notes (Signed)
Presenting complaint;  Follow-up for autoimmune hepatitis and GERD.  Subjective:  Patient is 82 year old African-American female who is here for scheduled visit. She was last seen on 01/13/2015. She states she is having less back pain. She had MRI and was found to have degenerative disease as well as disc disease with impingement of nerve roots. She has seen Dr. Ethelene Hal and had shots into her back. She had to last Friday. She is getting physical therapy at home 2-3 times a week. She states she cracked her tailbone when she sat on from chair briskly. She states she has not taken pain medication and 4 days but she still having problems with constipation. Been her bowels do not move she feels bloated and tight in her abdomen. She denies melena or rectal bleeding. Her appetite is very good except when she is having back pain. She is taking fluid pill on as-needed basis but not every day. She states she also saw Dr. Zenovia Jordan few weeks ago. She is not having heartburn while taking medication every other day. She is using cane and walker at home to amylase.   Current Medications: Outpatient Encounter Prescriptions as of 05/12/2015  Medication Sig  . acetaminophen (TYLENOL) 325 MG tablet Take 650 mg by mouth every 6 (six) hours as needed. For pain  . aspirin 81 MG chewable tablet Chew 2 tablets (162 mg total) by mouth daily.  Marland Kitchen azaTHIOprine (IMURAN) 50 MG tablet TAKE 1 AND 1/2 TABLETS BY MOUTH ONCE DAILY  . cetirizine (ZYRTEC) 10 MG tablet Take 10 mg by mouth daily as needed for allergies.  . Cholecalciferol (VITAMIN D) 2000 UNITS tablet Take 2,000 Units by mouth daily.  . Coenzyme Q10 200 MG capsule Take 200 mg by mouth daily.  . Darbepoetin Alfa (ARANESP) 500 MCG/ML SOSY injection Inject 500 mcg into the skin every 21 ( twenty-one) days.  Marland Kitchen DEXILANT 60 MG capsule TAKE 1 CAPSULE BY MOUTH EVERY OTHER DAY  . diltiazem (TIAZAC) 360 MG 24 hr capsule Take 1 capsule (360 mg total) by mouth daily.  . fish  oil-omega-3 fatty acids 1000 MG capsule Take 1 g by mouth daily.  . fluticasone (FLONASE) 50 MCG/ACT nasal spray Place 2 sprays into both nostrils daily as needed for allergies.   . folic acid (FOLVITE) 800 MCG tablet Take 800 mcg by mouth daily.   . furosemide (LASIX) 20 MG tablet Take 20 mg by mouth daily as needed. For fluid retention  . lidocaine-prilocaine (EMLA) cream Apply 1 application topically as needed (pain).   . Magnesium 400 MG TABS Take 1 tablet by mouth daily.   . metoprolol (LOPRESSOR) 50 MG tablet Take 1 tablet 2 times daily on Saturday and Sunday and resume one and a half tablets 2 times daily on Monday 04/06/14. (Patient taking differently: Take 50 mg by mouth 2 (two) times daily. )  . Multiple Vitamins-Minerals (MULTIVITAMIN WITH MINERALS) tablet Take 1 tablet by mouth daily.    Marland Kitchen oxyCODONE-acetaminophen (PERCOCET/ROXICET) 5-325 MG tablet Take 1-2 tablets by mouth every 4 (four) hours as needed for severe pain.  . polyethylene glycol (MIRALAX / GLYCOLAX) packet Take 17 g by mouth daily.  . potassium chloride SA (K-DUR,KLOR-CON) 20 MEQ tablet TAKE 1 TABLET BY MOUTH ONCE DAILY  . predniSONE (DELTASONE) 5 MG tablet Take 7.5 mg by mouth daily with breakfast.   . spironolactone (ALDACTONE) 50 MG tablet TAKE 1 TABLET BY MOUTH DAILY  . Travoprost, BAK Free, (TRAVATAN) 0.004 % SOLN ophthalmic solution Place 1 drop  into both eyes at bedtime.  . ursodiol (ACTIGALL) 300 MG capsule Take 300 mg by mouth 2 (two) times daily.  . [DISCONTINUED] azaTHIOprine (IMURAN) 50 MG tablet TAKE 1 AND 1/2 TABLETS BY MOUTH ONCE DAILY  . [DISCONTINUED] Hydrocodone-Acetaminophen 2.5-325 MG TABS Take 1 tablet by mouth 3 (three) times daily as needed. (Patient taking differently: Take 1 tablet by mouth 3 (three) times daily as needed (pain). )  . [DISCONTINUED] oxyCODONE-acetaminophen (PERCOCET/ROXICET) 5-325 MG tablet Take 1 tablet by mouth every 6 (six) hours as needed for severe pain.  . [DISCONTINUED]  predniSONE (DELTASONE) 10 MG tablet Take 10 mg by mouth daily with breakfast.  . [DISCONTINUED] spironolactone (ALDACTONE) 50 MG tablet TAKE 1 TABLET BY MOUTH DAILY  . [DISCONTINUED] ursodiol (ACTIGALL) 300 MG capsule TAKE 1 CAPSULE 3 TIMES DAILY.  . [DISCONTINUED] vitamin B-12 (CYANOCOBALAMIN) 1000 MCG tablet Take 500 mcg by mouth daily.    Facility-Administered Encounter Medications as of 05/12/2015  Medication  . 0.9 %  sodium chloride infusion  . diphenhydrAMINE (BENADRYL) capsule 25 mg     Objective: Blood pressure 110/94, pulse 102, temperature 97.3 F (36.3 C), temperature source Oral, resp. rate 18, height 5\' 6"  (1.676 m), weight 154 lb (69.854 kg). Patient is alert and in no acute distress. She is able to move from chair to examination table on her own. She does not have asterixis. She has a round facies. Conjunctiva is pink. Sclera is muddy but nonicteric Oropharyngeal mucosa is normal. No neck masses or thyromegaly noted. Cardiac exam with regular rhythm normal S1 and S2. No murmur or gallop noted. Lungs are clear to auscultation. Abdomen is soft and nontender with palpable/from left lobe of the liver. No masses.  She has 1+ pitting edema involving both legs, right greater than left.  Labs/studies Results: Lab data from 04/11/2015(Dr. Hill's office ). WBC 3.6, H&H 12.7 and 37.7 and platelet count 111K  ANC 2484  Electrolytes normal, BUN 20 and creatinine 1.20 serum calcium 10.0   LFTs from 03/15/2015  Bilirubin 1.7, AP 55, AST 283, ALT 21, total protein 5.7 and albumin 2.5  Serum calcium was 9.5.    Assessment:  #1. Autoimmune hepatitis. Patient remains on azathioprine and prednisone. AST remains elevated but ALT has been normal on multiple occasions. There is no evidence of hemolysis. Isolated AST elevation possibly not from hepatic source. Patient saw Dr. 05/15/2015 recently. Will confer with her as to her pain about this persistent abnormality. #2. GERD. She  is doing well with every other day PPI. #3. History of hypomagnesemia. #4. Constipation secondary to narcotic use for back pain. She is not having satisfactory results with polyethylene glycol.   Plan:  Patient will have LFTs metabolic 7 serum magnesium and sedimentation rate with her next blood work in 3 weeks. Patient advised to use Dulcolax suppository every night or on as-needed basis. Will stop with Dr. Zenovia Jordan regarding persistently elevated AST. Office visit in 3 months.

## 2015-05-12 NOTE — Patient Instructions (Signed)
Can use Dulcolax suppository every night on as-needed basis. Next blood work to be done in 3 weeks.

## 2015-05-18 ENCOUNTER — Encounter (INDEPENDENT_AMBULATORY_CARE_PROVIDER_SITE_OTHER): Payer: Self-pay

## 2015-05-21 ENCOUNTER — Encounter (INDEPENDENT_AMBULATORY_CARE_PROVIDER_SITE_OTHER): Payer: Self-pay | Admitting: *Deleted

## 2015-05-21 ENCOUNTER — Other Ambulatory Visit (INDEPENDENT_AMBULATORY_CARE_PROVIDER_SITE_OTHER): Payer: Self-pay | Admitting: *Deleted

## 2015-05-21 DIAGNOSIS — R740 Nonspecific elevation of levels of transaminase and lactic acid dehydrogenase [LDH]: Secondary | ICD-10-CM

## 2015-05-21 DIAGNOSIS — K754 Autoimmune hepatitis: Secondary | ICD-10-CM

## 2015-05-21 DIAGNOSIS — R7401 Elevation of levels of liver transaminase levels: Secondary | ICD-10-CM

## 2015-05-28 ENCOUNTER — Other Ambulatory Visit (INDEPENDENT_AMBULATORY_CARE_PROVIDER_SITE_OTHER): Payer: Self-pay | Admitting: Internal Medicine

## 2015-06-04 ENCOUNTER — Other Ambulatory Visit (HOSPITAL_COMMUNITY)
Admission: RE | Admit: 2015-06-04 | Discharge: 2015-06-04 | Disposition: A | Discharge status: Home / Self Care | Payer: Medicare Other | Source: Other Acute Inpatient Hospital | Attending: Internal Medicine | Admitting: Internal Medicine

## 2015-06-04 ENCOUNTER — Encounter (HOSPITAL_COMMUNITY): Payer: No Typology Code available for payment source

## 2015-06-04 ENCOUNTER — Other Ambulatory Visit (HOSPITAL_COMMUNITY): Payer: Self-pay | Admitting: Oncology

## 2015-06-04 DIAGNOSIS — M069 Rheumatoid arthritis, unspecified: Secondary | ICD-10-CM | POA: Diagnosis not present

## 2015-06-04 DIAGNOSIS — Z882 Allergy status to sulfonamides status: Secondary | ICD-10-CM | POA: Insufficient documentation

## 2015-06-04 DIAGNOSIS — Z8701 Personal history of pneumonia (recurrent): Secondary | ICD-10-CM | POA: Insufficient documentation

## 2015-06-04 DIAGNOSIS — K754 Autoimmune hepatitis: Secondary | ICD-10-CM | POA: Diagnosis present

## 2015-06-04 DIAGNOSIS — Z8719 Personal history of other diseases of the digestive system: Secondary | ICD-10-CM | POA: Insufficient documentation

## 2015-06-04 DIAGNOSIS — Z79899 Other long term (current) drug therapy: Secondary | ICD-10-CM | POA: Diagnosis not present

## 2015-06-04 DIAGNOSIS — I422 Other hypertrophic cardiomyopathy: Secondary | ICD-10-CM | POA: Diagnosis not present

## 2015-06-04 DIAGNOSIS — N183 Chronic kidney disease, stage 3 (moderate): Secondary | ICD-10-CM | POA: Diagnosis not present

## 2015-06-04 DIAGNOSIS — Z87442 Personal history of urinary calculi: Secondary | ICD-10-CM | POA: Insufficient documentation

## 2015-06-04 DIAGNOSIS — M545 Low back pain: Secondary | ICD-10-CM | POA: Insufficient documentation

## 2015-06-04 DIAGNOSIS — Z9889 Other specified postprocedural states: Secondary | ICD-10-CM | POA: Insufficient documentation

## 2015-06-04 DIAGNOSIS — Z7982 Long term (current) use of aspirin: Secondary | ICD-10-CM | POA: Insufficient documentation

## 2015-06-04 DIAGNOSIS — Z8673 Personal history of transient ischemic attack (TIA), and cerebral infarction without residual deficits: Secondary | ICD-10-CM | POA: Diagnosis not present

## 2015-06-04 DIAGNOSIS — D509 Iron deficiency anemia, unspecified: Secondary | ICD-10-CM

## 2015-06-04 DIAGNOSIS — D638 Anemia in other chronic diseases classified elsewhere: Secondary | ICD-10-CM | POA: Insufficient documentation

## 2015-06-04 DIAGNOSIS — I480 Paroxysmal atrial fibrillation: Secondary | ICD-10-CM | POA: Diagnosis not present

## 2015-06-04 DIAGNOSIS — D631 Anemia in chronic kidney disease: Secondary | ICD-10-CM | POA: Insufficient documentation

## 2015-06-04 DIAGNOSIS — D500 Iron deficiency anemia secondary to blood loss (chronic): Secondary | ICD-10-CM | POA: Insufficient documentation

## 2015-06-04 DIAGNOSIS — I129 Hypertensive chronic kidney disease with stage 1 through stage 4 chronic kidney disease, or unspecified chronic kidney disease: Secondary | ICD-10-CM | POA: Insufficient documentation

## 2015-06-04 DIAGNOSIS — D61818 Other pancytopenia: Secondary | ICD-10-CM

## 2015-06-04 LAB — BASIC METABOLIC PANEL
Anion gap: 11 (ref 5–15)
BUN: 17 mg/dL (ref 6–20)
CO2: 24 mmol/L (ref 22–32)
Calcium: 9.9 mg/dL (ref 8.9–10.3)
Chloride: 97 mmol/L — ABNORMAL LOW (ref 101–111)
Creatinine, Ser: 1.26 mg/dL — ABNORMAL HIGH (ref 0.44–1.00)
GFR calc Af Amer: 45 mL/min — ABNORMAL LOW (ref >60)
GFR calc non Af Amer: 39 mL/min — ABNORMAL LOW (ref >60)
Glucose, Bld: 149 mg/dL — ABNORMAL HIGH (ref 65–99)
Potassium: 4.4 mmol/L (ref 3.5–5.1)
Sodium: 132 mmol/L — ABNORMAL LOW (ref 135–145)

## 2015-06-04 LAB — SEDIMENTATION RATE: Sed Rate: 12 mm/hr (ref 0–22)

## 2015-06-04 LAB — CBC WITH DIFFERENTIAL/PLATELET
BASOS ABS: 0 10*3/uL (ref 0.0–0.1)
BASOS PCT: 1 %
EOS ABS: 0.1 10*3/uL (ref 0.0–0.7)
EOS PCT: 2 %
HCT: 33.4 % — ABNORMAL LOW (ref 36.0–46.0)
Hemoglobin: 11.2 g/dL — ABNORMAL LOW (ref 12.0–15.0)
Lymphocytes Relative: 13 %
Lymphs Abs: 0.5 10*3/uL — ABNORMAL LOW (ref 0.7–4.0)
MCH: 27.5 pg (ref 26.0–34.0)
MCHC: 33.5 g/dL (ref 30.0–36.0)
MCV: 82.1 fL (ref 78.0–100.0)
Monocytes Absolute: 0.5 10*3/uL (ref 0.1–1.0)
Monocytes Relative: 12 %
NEUTROS PCT: 72 %
Neutro Abs: 2.8 10*3/uL (ref 1.7–7.7)
PLATELETS: 95 10*3/uL — AB (ref 150–400)
RBC: 4.07 MIL/uL (ref 3.87–5.11)
RDW: 18.7 % — ABNORMAL HIGH (ref 11.5–15.5)
WBC: 3.9 10*3/uL — AB (ref 4.0–10.5)

## 2015-06-04 LAB — FERRITIN: FERRITIN: 47 ng/mL (ref 11–307)

## 2015-06-04 LAB — HEPATIC FUNCTION PANEL
ALT: 23 U/L (ref 14–54)
AST: 251 U/L — ABNORMAL HIGH (ref 15–41)
Albumin: 2.7 g/dL — ABNORMAL LOW (ref 3.5–5.0)
Alkaline Phosphatase: 65 U/L (ref 38–126)
Bilirubin, Direct: 0.8 mg/dL — ABNORMAL HIGH (ref 0.1–0.5)
Indirect Bilirubin: 0.8 mg/dL (ref 0.3–0.9)
Total Bilirubin: 1.6 mg/dL — ABNORMAL HIGH (ref 0.3–1.2)
Total Protein: 6.4 g/dL — ABNORMAL LOW (ref 6.5–8.1)

## 2015-06-04 LAB — MAGNESIUM: Magnesium: 1.3 mg/dL — ABNORMAL LOW (ref 1.7–2.4)

## 2015-06-08 ENCOUNTER — Telehealth (INDEPENDENT_AMBULATORY_CARE_PROVIDER_SITE_OTHER): Payer: Self-pay | Admitting: *Deleted

## 2015-06-08 ENCOUNTER — Encounter (HOSPITAL_BASED_OUTPATIENT_CLINIC_OR_DEPARTMENT_OTHER): Payer: No Typology Code available for payment source

## 2015-06-08 VITALS — BP 108/52 | HR 79 | Temp 97.8°F | Resp 18

## 2015-06-08 DIAGNOSIS — R790 Abnormal level of blood mineral: Secondary | ICD-10-CM

## 2015-06-08 DIAGNOSIS — D500 Iron deficiency anemia secondary to blood loss (chronic): Secondary | ICD-10-CM | POA: Diagnosis not present

## 2015-06-08 DIAGNOSIS — Q2733 Arteriovenous malformation of digestive system vessel: Secondary | ICD-10-CM

## 2015-06-08 DIAGNOSIS — N183 Chronic kidney disease, stage 3 unspecified: Secondary | ICD-10-CM

## 2015-06-08 DIAGNOSIS — D631 Anemia in chronic kidney disease: Secondary | ICD-10-CM

## 2015-06-08 MED ORDER — SODIUM CHLORIDE 0.9 % IV SOLN
510.0000 mg | Freq: Once | INTRAVENOUS | Status: AC
  Administered 2015-06-08: 510 mg via INTRAVENOUS
  Filled 2015-06-08: qty 17

## 2015-06-08 MED ORDER — SODIUM CHLORIDE 0.9 % IV SOLN
Freq: Once | INTRAVENOUS | Status: AC
  Administered 2015-06-08: 15:00:00 via INTRAVENOUS

## 2015-06-08 NOTE — Patient Instructions (Signed)
[facility] at [facility] Discharge Instructions  RECOMMENDATIONS MADE BY THE CONSULTANT AND ANY TEST RESULTS WILL BE SENT TO YOUR REFERRING PHYSICIAN.  IV iron today.    Thank you for choosing [facility] at [facility] to provide your oncology and hematology care.  To afford each patient quality time with our provider, please arrive at least 15 minutes before your scheduled appointment time.   Beginning January 23rd 2017 lab work for the Cancer Center will be done in the  Main lab at [facility] on 1st floor. If you have a lab appointment with the Cancer Center please come in thru the  Main Entrance and check in at the main information desk  You need to re-schedule your appointment should you arrive 10 or more minutes late.  We strive to give you quality time with our providers, and arriving late affects you and other patients whose appointments are after yours.  Also, if you no show three or more times for appointments you may be dismissed from the clinic at the providers discretion.     Again, thank you for choosing [facility].  Our hope is that these requests will decrease the amount of time that you wait before being seen by our physicians.       _____________________________________________________________  Should you have questions after your visit to [facility], please contact our office at [phone] between the hours of 8:30 a.m. and 4:30 p.m.  Voicemails left after 4:30 p.m. will not be returned until the following business day.  For prescription refill requests, have your pharmacy contact our office.         Resources For Cancer Patients and their Caregivers ? American [employer] Society: Can assist with transportation, wigs, general needs, runs Look Good Feel Better.        1-888-227-6333 ? Cancer Care: Provides financial assistance, online support groups, medication/co-pay assistance.  1-800-813-HOPE  (4673) ? [facility] Assists [place] cancer patients and their families through emotional , educational and financial support.  336-427-4357 ? [place] DSS Where to apply for food stamps, Medicaid and utility assistance. 336-342-1394 ? [facility]: Transportation to medical appointments. 336-347-2287 ? Social Security Administration: May apply for disability if have a Stage IV cancer. 336-342-7796 1-800-772-1213 ? [place] [facility]ging, Disability and Transit Services: Assists with nutrition, care and transit needs. 336-349-2343  Cancer Center Support Programs: @[email or web address]@ > Cancer Support Group  2nd Tuesday of the month 1pm-2pm, Journey Room  > Creative Journey  3rd Tuesday of the month 1130am-1pm, Journey Room  > Look Good Feel Better  1st Wednesday of the month 10am-12 noon, Journey Room (Call American [employer] to register 1-800-395-5775)    

## 2015-06-08 NOTE — Progress Notes (Signed)
Patient tolerated infusion well.  VSS.   

## 2015-06-08 NOTE — Telephone Encounter (Signed)
Per Dr.Rehman the patient will need to have labs drawn in 1 month. 

## 2015-06-18 ENCOUNTER — Inpatient Hospital Stay (HOSPITAL_COMMUNITY)
Admission: EM | Admit: 2015-06-18 | Discharge: 2015-06-22 | DRG: 690 | Disposition: A | Discharge status: Skilled Nursing Facility | Payer: Medicare Other | Attending: Family Medicine | Admitting: Family Medicine

## 2015-06-18 ENCOUNTER — Encounter (HOSPITAL_COMMUNITY): Payer: Self-pay

## 2015-06-18 ENCOUNTER — Emergency Department (HOSPITAL_COMMUNITY): Payer: Medicare Other

## 2015-06-18 DIAGNOSIS — I129 Hypertensive chronic kidney disease with stage 1 through stage 4 chronic kidney disease, or unspecified chronic kidney disease: Secondary | ICD-10-CM | POA: Diagnosis present

## 2015-06-18 DIAGNOSIS — R059 Cough, unspecified: Secondary | ICD-10-CM

## 2015-06-18 DIAGNOSIS — Z8701 Personal history of pneumonia (recurrent): Secondary | ICD-10-CM

## 2015-06-18 DIAGNOSIS — E872 Acidosis, unspecified: Secondary | ICD-10-CM | POA: Diagnosis present

## 2015-06-18 DIAGNOSIS — Z8711 Personal history of peptic ulcer disease: Secondary | ICD-10-CM

## 2015-06-18 DIAGNOSIS — J449 Chronic obstructive pulmonary disease, unspecified: Secondary | ICD-10-CM | POA: Diagnosis present

## 2015-06-18 DIAGNOSIS — N390 Urinary tract infection, site not specified: Secondary | ICD-10-CM | POA: Diagnosis not present

## 2015-06-18 DIAGNOSIS — M069 Rheumatoid arthritis, unspecified: Secondary | ICD-10-CM | POA: Diagnosis present

## 2015-06-18 DIAGNOSIS — Z8262 Family history of osteoporosis: Secondary | ICD-10-CM

## 2015-06-18 DIAGNOSIS — N183 Chronic kidney disease, stage 3 (moderate): Secondary | ICD-10-CM | POA: Diagnosis present

## 2015-06-18 DIAGNOSIS — Z79899 Other long term (current) drug therapy: Secondary | ICD-10-CM

## 2015-06-18 DIAGNOSIS — I10 Essential (primary) hypertension: Secondary | ICD-10-CM | POA: Diagnosis present

## 2015-06-18 DIAGNOSIS — I4892 Unspecified atrial flutter: Secondary | ICD-10-CM | POA: Diagnosis present

## 2015-06-18 DIAGNOSIS — Z7951 Long term (current) use of inhaled steroids: Secondary | ICD-10-CM

## 2015-06-18 DIAGNOSIS — N3001 Acute cystitis with hematuria: Secondary | ICD-10-CM | POA: Diagnosis not present

## 2015-06-18 DIAGNOSIS — Z823 Family history of stroke: Secondary | ICD-10-CM

## 2015-06-18 DIAGNOSIS — R778 Other specified abnormalities of plasma proteins: Secondary | ICD-10-CM | POA: Diagnosis present

## 2015-06-18 DIAGNOSIS — R05 Cough: Secondary | ICD-10-CM

## 2015-06-18 DIAGNOSIS — Z7982 Long term (current) use of aspirin: Secondary | ICD-10-CM

## 2015-06-18 DIAGNOSIS — N179 Acute kidney failure, unspecified: Secondary | ICD-10-CM | POA: Diagnosis present

## 2015-06-18 DIAGNOSIS — R7989 Other specified abnormal findings of blood chemistry: Secondary | ICD-10-CM | POA: Diagnosis not present

## 2015-06-18 DIAGNOSIS — Z825 Family history of asthma and other chronic lower respiratory diseases: Secondary | ICD-10-CM

## 2015-06-18 DIAGNOSIS — M549 Dorsalgia, unspecified: Secondary | ICD-10-CM

## 2015-06-18 DIAGNOSIS — R29898 Other symptoms and signs involving the musculoskeletal system: Secondary | ICD-10-CM | POA: Diagnosis present

## 2015-06-18 DIAGNOSIS — Z8261 Family history of arthritis: Secondary | ICD-10-CM

## 2015-06-18 DIAGNOSIS — F17210 Nicotine dependence, cigarettes, uncomplicated: Secondary | ICD-10-CM | POA: Diagnosis present

## 2015-06-18 DIAGNOSIS — Z8249 Family history of ischemic heart disease and other diseases of the circulatory system: Secondary | ICD-10-CM

## 2015-06-18 DIAGNOSIS — G8929 Other chronic pain: Secondary | ICD-10-CM

## 2015-06-18 LAB — CBC WITH DIFFERENTIAL/PLATELET
BASOS ABS: 0 10*3/uL (ref 0.0–0.1)
BASOS PCT: 0 %
EOS PCT: 1 %
Eosinophils Absolute: 0 10*3/uL (ref 0.0–0.7)
HEMATOCRIT: 35.8 % — AB (ref 36.0–46.0)
HEMOGLOBIN: 12.1 g/dL (ref 12.0–15.0)
LYMPHS PCT: 4 %
Lymphs Abs: 0.2 10*3/uL — ABNORMAL LOW (ref 0.7–4.0)
MCH: 28.5 pg (ref 26.0–34.0)
MCHC: 33.8 g/dL (ref 30.0–36.0)
MCV: 84.2 fL (ref 78.0–100.0)
Monocytes Absolute: 0.4 10*3/uL (ref 0.1–1.0)
Monocytes Relative: 8 %
NEUTROS ABS: 3.9 10*3/uL (ref 1.7–7.7)
Neutrophils Relative %: 87 %
Platelets: 61 10*3/uL — ABNORMAL LOW (ref 150–400)
RBC: 4.25 MIL/uL (ref 3.87–5.11)
RDW: 22.9 % — AB (ref 11.5–15.5)
WBC: 4.5 10*3/uL (ref 4.0–10.5)

## 2015-06-18 LAB — URINALYSIS, ROUTINE W REFLEX MICROSCOPIC
BILIRUBIN URINE: NEGATIVE
Glucose, UA: NEGATIVE mg/dL
Hgb urine dipstick: NEGATIVE
NITRITE: NEGATIVE
PH: 6 (ref 5.0–8.0)
PROTEIN: NEGATIVE mg/dL
Specific Gravity, Urine: 1.015 (ref 1.005–1.030)

## 2015-06-18 LAB — I-STAT CG4 LACTIC ACID, ED: Lactic Acid, Venous: 3.35 mmol/L (ref 0.5–2.0)

## 2015-06-18 LAB — URINE MICROSCOPIC-ADD ON

## 2015-06-18 LAB — COMPREHENSIVE METABOLIC PANEL
ALBUMIN: 2.7 g/dL — AB (ref 3.5–5.0)
ALK PHOS: 85 U/L (ref 38–126)
ALT: 26 U/L (ref 14–54)
ANION GAP: 8 (ref 5–15)
AST: 256 U/L — ABNORMAL HIGH (ref 15–41)
BUN: 25 mg/dL — ABNORMAL HIGH (ref 6–20)
CALCIUM: 10.9 mg/dL — AB (ref 8.9–10.3)
CHLORIDE: 95 mmol/L — AB (ref 101–111)
CO2: 29 mmol/L (ref 22–32)
CREATININE: 1.58 mg/dL — AB (ref 0.44–1.00)
GFR calc non Af Amer: 29 mL/min — ABNORMAL LOW (ref >60)
GFR, EST AFRICAN AMERICAN: 34 mL/min — AB (ref >60)
GLUCOSE: 152 mg/dL — AB (ref 65–99)
POTASSIUM: 4.8 mmol/L (ref 3.5–5.1)
SODIUM: 132 mmol/L — AB (ref 135–145)
Total Bilirubin: 2 mg/dL — ABNORMAL HIGH (ref 0.3–1.2)
Total Protein: 5.9 g/dL — ABNORMAL LOW (ref 6.5–8.1)

## 2015-06-18 LAB — CBG MONITORING, ED: Glucose-Capillary: 168 mg/dL — ABNORMAL HIGH (ref 65–99)

## 2015-06-18 LAB — TROPONIN I
TROPONIN I: 0.05 ng/mL — AB (ref <0.031)
TROPONIN I: 0.03 ng/mL (ref <0.031)

## 2015-06-18 LAB — LACTIC ACID, PLASMA: Lactic Acid, Venous: 3 mmol/L (ref 0.5–2.0)

## 2015-06-18 MED ORDER — AZATHIOPRINE 50 MG PO TABS
75.0000 mg | ORAL_TABLET | Freq: Every day | ORAL | Status: DC
  Administered 2015-06-19 – 2015-06-22 (×4): 75 mg via ORAL
  Filled 2015-06-18 (×6): qty 2

## 2015-06-18 MED ORDER — PREDNISONE 5 MG PO TABS
7.5000 mg | ORAL_TABLET | Freq: Every day | ORAL | Status: DC
  Administered 2015-06-19 – 2015-06-22 (×4): 7.5 mg via ORAL
  Filled 2015-06-18 (×4): qty 2

## 2015-06-18 MED ORDER — PANTOPRAZOLE SODIUM 40 MG PO TBEC
80.0000 mg | DELAYED_RELEASE_TABLET | Freq: Every day | ORAL | Status: DC
Start: 2015-06-19 — End: 2015-06-22
  Administered 2015-06-19 – 2015-06-22 (×4): 80 mg via ORAL
  Filled 2015-06-18 (×4): qty 2

## 2015-06-18 MED ORDER — ACETAMINOPHEN 325 MG PO TABS: 650.0000 mg | ORAL_TABLET | Freq: Four times a day (QID) | ORAL | Status: DC | PRN

## 2015-06-18 MED ORDER — POLYETHYLENE GLYCOL 3350 17 G PO PACK: 17.0000 g | PACK | Freq: Every day | ORAL | Status: DC | PRN

## 2015-06-18 MED ORDER — SODIUM CHLORIDE 0.9 % IV SOLN
INTRAVENOUS | Status: DC
  Administered 2015-06-19: 1000 mL via INTRAVENOUS
  Administered 2015-06-19: 01:00:00 via INTRAVENOUS

## 2015-06-18 MED ORDER — SODIUM CHLORIDE 0.9 % IV BOLUS (SEPSIS)
1000.0000 mL | Freq: Once | INTRAVENOUS | Status: AC
  Administered 2015-06-18: 1000 mL via INTRAVENOUS

## 2015-06-18 MED ORDER — SODIUM CHLORIDE 0.9 % IV BOLUS (SEPSIS)
500.0000 mL | Freq: Once | INTRAVENOUS | Status: AC
  Administered 2015-06-18: 500 mL via INTRAVENOUS

## 2015-06-18 MED ORDER — DILTIAZEM HCL ER COATED BEADS 240 MG PO CP24
360.0000 mg | ORAL_CAPSULE | Freq: Every day | ORAL | Status: DC
  Administered 2015-06-19 – 2015-06-22 (×4): 360 mg via ORAL
  Filled 2015-06-18 (×5): qty 1

## 2015-06-18 MED ORDER — ACETAMINOPHEN 650 MG RE SUPP: 650.0000 mg | Freq: Four times a day (QID) | RECTAL | Status: DC | PRN

## 2015-06-18 MED ORDER — SPIRONOLACTONE 25 MG PO TABS
50.0000 mg | ORAL_TABLET | Freq: Every day | ORAL | Status: DC
Start: 2015-06-19 — End: 2015-06-19
  Administered 2015-06-19: 50 mg via ORAL
  Filled 2015-06-18: qty 2

## 2015-06-18 MED ORDER — URSODIOL 300 MG PO CAPS
300.0000 mg | ORAL_CAPSULE | Freq: Two times a day (BID) | ORAL | Status: DC
  Administered 2015-06-19 – 2015-06-22 (×8): 300 mg via ORAL
  Filled 2015-06-18 (×12): qty 1

## 2015-06-18 MED ORDER — ASPIRIN 81 MG PO CHEW
162.0000 mg | CHEWABLE_TABLET | Freq: Every day | ORAL | Status: DC
  Administered 2015-06-19 – 2015-06-22 (×4): 162 mg via ORAL
  Filled 2015-06-18 (×4): qty 2

## 2015-06-18 MED ORDER — GABAPENTIN 100 MG PO CAPS
100.0000 mg | ORAL_CAPSULE | Freq: Every day | ORAL | Status: DC
  Administered 2015-06-19 – 2015-06-20 (×3): 100 mg via ORAL
  Filled 2015-06-18 (×4): qty 1

## 2015-06-18 MED ORDER — DEXTROSE 5 % IV SOLN
1.0000 g | Freq: Once | INTRAVENOUS | Status: AC
  Administered 2015-06-18: 1 g via INTRAVENOUS
  Filled 2015-06-18: qty 10

## 2015-06-18 MED ORDER — METOPROLOL TARTRATE 50 MG PO TABS
50.0000 mg | ORAL_TABLET | Freq: Two times a day (BID) | ORAL | Status: DC
  Administered 2015-06-19 – 2015-06-22 (×8): 50 mg via ORAL
  Filled 2015-06-18 (×8): qty 1

## 2015-06-18 MED ORDER — LIDOCAINE-PRILOCAINE 2.5-2.5 % EX CREA: 1.0000 "application " | TOPICAL_CREAM | CUTANEOUS | Status: DC | PRN

## 2015-06-18 MED ORDER — LORATADINE 10 MG PO TABS
10.0000 mg | ORAL_TABLET | Freq: Every day | ORAL | Status: DC
  Administered 2015-06-19 – 2015-06-21 (×3): 10 mg via ORAL
  Filled 2015-06-18 (×3): qty 1

## 2015-06-18 MED ORDER — ALPRAZOLAM 0.25 MG PO TABS
0.1250 mg | ORAL_TABLET | Freq: Every day | ORAL | Status: DC | PRN
Start: 2015-06-18 — End: 2015-06-21
  Administered 2015-06-20: 0.125 mg via ORAL
  Filled 2015-06-18: qty 1

## 2015-06-18 MED ORDER — SODIUM CHLORIDE 0.9 % IV SOLN: INTRAVENOUS | Status: AC

## 2015-06-18 MED ORDER — FLUTICASONE PROPIONATE 50 MCG/ACT NA SUSP: 2.0000 | Freq: Every day | NASAL | Status: DC | PRN

## 2015-06-18 MED ORDER — MAGNESIUM OXIDE 400 (241.3 MG) MG PO TABS
400.0000 mg | ORAL_TABLET | Freq: Every day | ORAL | Status: DC
  Administered 2015-06-19 – 2015-06-22 (×4): 400 mg via ORAL
  Filled 2015-06-18 (×4): qty 1

## 2015-06-18 MED ORDER — LATANOPROST 0.005 % OP SOLN
1.0000 [drp] | Freq: Every day | OPHTHALMIC | Status: DC
  Administered 2015-06-19 – 2015-06-21 (×4): 1 [drp] via OPHTHALMIC
  Filled 2015-06-18: qty 2.5

## 2015-06-18 MED ORDER — OXYCODONE-ACETAMINOPHEN 5-325 MG PO TABS
0.5000 | ORAL_TABLET | ORAL | Status: DC | PRN
  Administered 2015-06-19: 0.5 via ORAL
  Filled 2015-06-18 (×2): qty 1

## 2015-06-18 NOTE — ED Notes (Addendum)
Daughter reports pt usually walks with a walker but today pt c/o weakness in both legs.  Reports was walking today and both legs gave out.  Pt recently started gabapentin on Tuesday and cipro for a uti.  Pt says both legs feel weak and feet swollen.  Denies pain.  Pt had an iron infusion last week.

## 2015-06-18 NOTE — ED Provider Notes (Signed)
CSN: [id number]     Arrival date & time 06/18/15  1721 History   First MD Initiated Contact with Patient 06/18/15 1829     Chief Complaint  Patient presents with  . Weakness      HPI Pt was seen at 1830. Per pt and her family, c/o gradual onset and worsening of persistent "weakness" that was noticed when she woke up this morning. Pt states she is unable to stand or walk due to "my legs just give out." Pt walks with walker at baseline. Pt was evaluated by her PMD 2 days ago, dx UTI and started on cipro. Pt was also started on neurontin for chronic legs pain. Denies abd pain, no N/V/D, no CP/SOB, no neck or back pain, no focal motor weakness, no tingling/numbness in extremities, no fevers, no rash.    Past Medical History  Diagnosis Date  . History of GI bleed     Associated with NSAIDS  . Iron deficiency anemia     Transfusion dependent  . DJD (degenerative joint disease)   . Essential hypertension, benign   . History of colitis   . Ileitis   . Pancytopenia   . Autoimmune hepatitis (HCC)     With leukocytoclastic vasculitis  . Heart block AV second degree March 2013    a. Cardiology consult note 06/2013: "Question of previous second degree heart block, although review of cardiology notes indicates that this may have been actually blocked PACs when the patient was on verapamil."  . Bronchitis   . Steroid-induced myopathy   . Renal calculus 08/12/2011  . Rheumatoid arthritis(714.0)   . Colon ulcer 04/2010    NSAID related  . Gastric ulcer 04/2010    NSAID related  . UGI bleed 09/20/2011    Focal area of gastritis oozing of blood  . Gastric AVM 09/20/2011  . Candida esophagitis (HCC) 09/20/2011  . Paroxysmal atrial fibrillation (HCC)     Not on anticoag 2/2 hx of GIB/AVM  . CKD (chronic kidney disease), stage III   . Cholestatic hepatitis   . UTI (lower urinary tract infection)     history  . Paroxysmal atrial flutter (HCC)     Not on anticoag 2/2 hx of GIB/AVM - dx 06/2013.  .  Multifocal atrial tachycardia (HCC)   . Hypertrophic cardiomyopathy (HCC)     Echo 03/2011: severe LVH suggesting possble infiltrate cardiomyopathy or advanced hypertensive heart disease, increased  echogenicity of the ventricular septum as well as the pericardium, EF >70% with end systolicmid-cavitary obliteration of the ventricle, stage 1 DD, mild MR, small IVC.  Marland Kitchen Hypomagnesemia   . Chronic renal disease, stage 3, moderately decreased glomerular filtration rate between 30-59 mL/min/1.73 square meter 02/03/2014  . Anemia of chronic disease 11/25/2011  . Anemia of chronic renal failure, stage 3 (moderate) 02/03/2014  . Low back pain   . 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 Biatrial enlargement 04/04/2014  . History of pneumonia 12/2013  . Atrial fibrillation [facility])    Past Surgical History  Procedure Laterality Date  . Colonoscopy  04/2010  . Upper gastrointestinal endoscopy  04/2010  . Esophagogastroduodenoscopy  09/20/2011    Status post APC.  Marland Kitchen Esophagogastroduodenoscopy  09/20/2011    Procedure: ESOPHAGOGASTRODUODENOSCOPY (EGD);  Surgeon: Malissa Hippo, MD;  [facility];  Service: Endoscopy;  Laterality: N/A;  . Cyst removal hand      Elbow  . Sebaceous cyst removed      bilateral elbows  . Colonoscopy N/A  07/04/2014    Procedure: COLONOSCOPY;  Surgeon: Malissa Hippo, MD;  Location: [facility];  Service: Endoscopy;  Laterality: N/A;  730  . Esophagogastroduodenoscopy N/A 07/04/2014    Procedure: ESOPHAGOGASTRODUODENOSCOPY (EGD);  Surgeon: Malissa Hippo, MD;  [facility]: [facility];  Service: Endoscopy;  Laterality: N/A;  . Givens capsule study N/A 07/14/2014    Procedure: GIVENS CAPSULE STUDY;  Surgeon: Malissa Hippo, MD;  [facility]: [facility];  Service: Endoscopy;  Laterality: N/A;  730   Family History  Problem Relation Age of Onset  . Stroke Mother   . Hypertension Sister   . Hypertension Brother   . Heart disease Mother 61  . Heart disease Father 95  .  COPD Brother   . Arthritis Brother   . Osteoporosis Sister    Social History  Substance Use Topics  . Smoking status: Current Some Day Smoker -- 0.25 packs/day for 50 years    Types: Cigarettes    Start date: 04/10/1945  . Smokeless tobacco: Never Used  . Alcohol Use: No   OB History    Gravida Para Term Preterm AB TAB SAB Ectopic Multiple Living   4 4 4       4      Review of Systems ROS: Statement: All systems negative except as marked or noted in the HPI; Constitutional: Negative for fever and chills. +weakness, unable to stand/walk. ; ; Eyes: Negative for eye pain, redness and discharge. ; ; ENMT: Negative for ear pain, hoarseness, nasal congestion, sinus pressure and sore throat. ; ; Cardiovascular: Negative for chest pain, palpitations, diaphoresis, dyspnea and peripheral edema. ; ; Respiratory: Negative for cough, wheezing and stridor. ; ; Gastrointestinal: Negative for nausea, vomiting, diarrhea, abdominal pain, blood in stool, hematemesis, jaundice and rectal bleeding. ; ; Genitourinary: Negative for dysuria, flank pain and hematuria. ; ; Musculoskeletal: Negative for back pain and neck pain. Negative for swelling and trauma.; ; Skin: Negative for pruritus, rash, abrasions, blisters, bruising and skin lesion.; ; Neuro: Negative for headache, lightheadedness and neck stiffness. Negative for altered level of consciousness, altered mental status, paresthesias, involuntary movement, seizure and syncope.      Allergies  Iohexol; Ivp dye; Naprosyn; Red blood cells; Verapamil; Vicodin; and Sulfa antibiotics  Home Medications   Prior to Admission medications   Medication Sig Start Date End Date Taking? Authorizing Provider  acetaminophen (TYLENOL) 325 MG tablet Take 650 mg by mouth every 6 (six) hours as needed. For pain    Historical Provider, MD  ALPRAZolam Prudy Feeler) 0.25 MG tablet  06/16/15   Historical Provider, MD  aspirin 81 MG chewable tablet Chew 2 tablets (162 mg total) by mouth  daily. 04/04/14   Elliot Cousin, MD  azaTHIOprine (IMURAN) 50 MG tablet TAKE 1 AND 1/2 TABLETS BY MOUTH ONCE DAILY 03/06/15   Len Blalock, NP  bisacodyl (DULCOLAX) 10 MG suppository Place 1 suppository (10 mg total) rectally as needed for moderate constipation. 05/12/15   Malissa Hippo, MD  cetirizine (ZYRTEC) 10 MG tablet Take 10 mg by mouth daily as needed for allergies.    Historical Provider, MD  Cholecalciferol (VITAMIN D) 2000 UNITS tablet Take 2,000 Units by mouth daily.    Historical Provider, MD  ciprofloxacin (CIPRO) 250 MG tablet  06/16/15   Historical Provider, MD  Coenzyme Q10 200 MG capsule Take 200 mg by mouth daily.    Historical Provider, MD  Darbepoetin Alfa (ARANESP) 500 MCG/ML SOSY injection Inject 500 mcg into the skin  every 21 ( twenty-one) days. Reported on 05/12/2015    Historical Provider, MD  DEXILANT 60 MG capsule TAKE 1 CAPSULE BY MOUTH EVERY OTHER DAY 10/21/14   Malissa Hippo, MD  diltiazem (TIAZAC) 360 MG 24 hr capsule Take 1 capsule (360 mg total) by mouth daily. 01/06/14   Hollice Espy, MD  fish oil-omega-3 fatty acids 1000 MG capsule Take 1 g by mouth daily.    Historical Provider, MD  fluticasone (FLONASE) 50 MCG/ACT nasal spray Place 2 sprays into both nostrils daily as needed for allergies.  03/26/14   Historical Provider, MD  folic acid (FOLVITE) 800 MCG tablet Take 800 mcg by mouth daily.     Historical Provider, MD  furosemide (LASIX) 20 MG tablet Take 20 mg by mouth daily as needed. For fluid retention 04/26/11   Malissa Hippo, MD  gabapentin (NEURONTIN) 100 MG capsule  06/16/15   Historical Provider, MD  lidocaine-prilocaine (EMLA) cream Apply 1 application topically as needed (pain).  06/20/14   Historical Provider, MD  Magnesium 400 MG TABS Take 1 tablet by mouth daily.     Historical Provider, MD  metoprolol (LOPRESSOR) 50 MG tablet Take 1 tablet 2 times daily on Saturday and Sunday and resume one and a half tablets 2 times daily on Monday 04/06/14. Patient  taking differently: Take 50 mg by mouth 2 (two) times daily.  04/04/14   Elliot Cousin, MD  Multiple Vitamins-Minerals (MULTIVITAMIN WITH MINERALS) tablet Take 1 tablet by mouth daily.      Historical Provider, MD  oxyCODONE-acetaminophen (PERCOCET/ROXICET) 5-325 MG tablet Take 1-2 tablets by mouth every 4 (four) hours as needed for severe pain. 03/17/15   Marinda Elk, MD  polyethylene glycol [facility] Ethelene Hal) packet Take 17 g by mouth daily. 03/17/15   Marinda Elk, MD  potassium chloride SA (K-DUR,KLOR-CON) 20 MEQ tablet TAKE 1 TABLET BY MOUTH ONCE DAILY 05/28/15   Malissa Hippo, MD  predniSONE (DELTASONE) 5 MG tablet Take 7.5 mg by mouth daily with breakfast.  03/13/15   Historical Provider, MD  spironolactone (ALDACTONE) 50 MG tablet TAKE 1 TABLET BY MOUTH DAILY 02/25/15   Malissa Hippo, MD  Travoprost, BAK Free, (TRAVATAN) 0.004 % SOLN ophthalmic solution Place 1 drop into both eyes at bedtime. 09/20/11   Elliot Cousin, MD  ursodiol (ACTIGALL) 300 MG capsule Take 300 mg by mouth 2 (two) times daily.    Historical Provider, MD   BP 120/81 mmHg  Pulse 84  Temp(Src) 98.3 F (36.8 C) (Oral)  Resp 18  Ht 5\' 5"  (1.651 m)  Wt 154 lb (69.854 kg)  BMI 25.63 kg/m2  SpO2 100%   18:49 Orthostatic Vital Signs LB  Orthostatic Lying  - BP- Lying: 120/81 mmHg ; Pulse- Lying: 84  Orthostatic Sitting - BP- Sitting: 118/74 mmHg ; Pulse- Sitting: 96  Orthostatic Standing at 0 minutes - BP- Standing at 0 minutes: 129/116 mmHg ; Pulse- Standing at 0 minutes: 100      Physical Exam  1835: Physical examination:  Nursing notes reviewed; Vital signs and O2 SAT reviewed;  Constitutional: Well developed, Well nourished, Well hydrated, In no acute distress; Head:  Normocephalic, atraumatic; Eyes: EOMI, PERRL, No scleral icterus; ENMT: Mouth and pharynx normal, Mucous membranes moist; Neck: Supple, Full range of motion, No lymphadenopathy; Cardiovascular: Irregular irregular rate and rhythm, No  gallop; Respiratory: Breath sounds clear & equal bilaterally, No wheezes.  Speaking full sentences with ease, Normal respiratory effort/excursion; Chest: Nontender, Movement normal;  Abdomen: Soft, Nontender, Nondistended, Normal bowel sounds; Genitourinary: No CVA tenderness; Spine:  No midline CS, TS, LS tenderness.;; Extremities: Pulses normal, Pelvis stable. No tenderness, +2 pedal edema bilat. No calf asymmetry. Pt is able to raise extended bilat LE's off stretcher without pain.; Neuro: AA&Ox3, Major CN grossly intact. No facial droop. Speech clear. Grips equal. Strength 5/5 bilat UE's. Strength 4/5 bilat LE's. No gross focal motor or sensory deficits in extremities.; Skin: Color normal, Warm, Dry.   ED Course  Procedures (including critical care time) Labs Review  Imaging Review  I have personally reviewed and evaluated these images and lab results as part of my medical decision-making.   EKG Interpretation   Date/Time:  Thursday June 18 2015 17:43:28 EDT Ventricular Rate:  91 PR Interval:    QRS Duration: 82 QT Interval:  362 QTC Calculation: 445 R Axis:   1 Text Interpretation:  Atrial flutter with variable A-V block ST \\T \ T wave  abnormality, consider lateral ischemia When compared with ECG of 03/13/2015  QT has shortened Otherwise no significant change Confirmed by [facility]  MD,  Nicholos Johns [phone]) on 06/18/2015 6:47:35 PM      MDM  MDM Reviewed: previous chart, nursing note and vitals Reviewed previous: labs and ECG Interpretation: labs, ECG, x-ray and CT scan     Results for orders placed or performed during the hospital encounter of 06/18/15  CBC with Differential  Result Value Ref Range   WBC 4.5 4.0 - 10.5 K/uL   RBC 4.25 3.87 - 5.11 MIL/uL   Hemoglobin 12.1 12.0 - 15.0 g/dL   HCT 60.4 (L) 54.0 - 98.1 %   MCV 84.2 78.0 - 100.0 fL   MCH 28.5 26.0 - 34.0 pg   MCHC 33.8 30.0 - 36.0 g/dL   RDW 19.1 (H) 47.8 - 29.5 %   Platelets 61 (L) 150 - 400 K/uL   Neutrophils  Relative % 87 %   Neutro Abs 3.9 1.7 - 7.7 K/uL   Lymphocytes Relative 4 %   Lymphs Abs 0.2 (L) 0.7 - 4.0 K/uL   Monocytes Relative 8 %   Monocytes Absolute 0.4 0.1 - 1.0 K/uL   Eosinophils Relative 1 %   Eosinophils Absolute 0.0 0.0 - 0.7 K/uL   Basophils Relative 0 %   Basophils Absolute 0.0 0.0 - 0.1 K/uL   RBC Morphology POLYCHROMASIA PRESENT   Urinalysis, Routine w reflex microscopic (not at [facility])  Result Value Ref Range   Color, Urine YELLOW YELLOW   APPearance CLEAR CLEAR   Specific Gravity, Urine 1.015 1.005 - 1.030   pH 6.0 5.0 - 8.0   Glucose, UA NEGATIVE NEGATIVE mg/dL   Hgb urine dipstick NEGATIVE NEGATIVE   Bilirubin Urine NEGATIVE NEGATIVE   Ketones, ur TRACE (A) NEGATIVE mg/dL   Protein, ur NEGATIVE NEGATIVE mg/dL   Nitrite NEGATIVE NEGATIVE   Leukocytes, UA SMALL (A) NEGATIVE  Troponin I  Result Value Ref Range   Troponin I 0.05 (H) <0.031 ng/mL  Comprehensive metabolic panel  Result Value Ref Range   Sodium 132 (L) 135 - 145 mmol/L   Potassium 4.8 3.5 - 5.1 mmol/L   Chloride 95 (L) 101 - 111 mmol/L   CO2 29 22 - 32 mmol/L   Glucose, Bld 152 (H) 65 - 99 mg/dL   BUN 25 (H) 6 - 20 mg/dL   Creatinine, Ser 6.21 (H) 0.44 - 1.00 mg/dL   Calcium 30.8 (H) 8.9 - 10.3 mg/dL   Total Protein 5.9 (L) 6.5 -  8.1 g/dL   Albumin 2.7 (L) 3.5 - 5.0 g/dL   AST 588 (H) 15 - 41 U/L   ALT 26 14 - 54 U/L   Alkaline Phosphatase 85 38 - 126 U/L   Total Bilirubin 2.0 (H) 0.3 - 1.2 mg/dL   GFR calc non Af Amer 29 (L) >60 mL/min   GFR calc Af Amer 34 (L) >60 mL/min   Anion gap 8 5 - 15  Urine microscopic-add on  Result Value Ref Range   Squamous Epithelial / LPF 0-5 (A) NONE SEEN   WBC, UA 6-30 0 - 5 WBC/hpf   RBC / HPF 0-5 0 - 5 RBC/hpf   Bacteria, UA MANY (A) NONE SEEN  CBG monitoring, ED  Result Value Ref Range   Glucose-Capillary 168 (H) 65 - 99 mg/dL  I-Stat CG4 Lactic Acid, ED  Result Value Ref Range   Lactic Acid, Venous 3.35 (HH) 0.5 - 2.0 mmol/L   Dg Chest 2  View 06/18/2015  CLINICAL DATA:  Acute onset of bilateral lower extremity weakness and swelling. Initial encounter. EXAM: CHEST  2 VIEW COMPARISON:  Chest radiograph performed 03/15/2015 FINDINGS: The lungs are well-aerated. Pulmonary vascularity is at the upper limits of normal. There is no evidence of focal opacification, pleural effusion or pneumothorax. The heart is enlarged.  No acute osseous abnormalities are seen. IMPRESSION: Cardiomegaly.  Lungs remain grossly clear. Electronically Signed   By: Roanna Raider M.D.   On: 06/18/2015 19:22   Ct Head Wo Contrast 06/18/2015  CLINICAL DATA:  Bilateral leg weakness. EXAM: CT HEAD WITHOUT CONTRAST TECHNIQUE: Contiguous axial images were obtained from the base of the skull through the vertex without intravenous contrast. COMPARISON:  03/13/2015 FINDINGS: Brain: No evidence of acute infarction, hemorrhage, extra-axial collection, ventriculomegaly, or mass effect. There is moderate brain parenchymal atrophy and mild bilateral deep white matter microvascular ischemia. Vascular: Vascular calcifications at the skullbase. Skull: Negative for fracture or focal lesion. Sinuses/Orbits: No acute findings. Other: None. IMPRESSION: No acute intracranial abnormality. Atrophy, chronic microvascular disease. Electronically Signed   By: Ted Mcalpine M.D.   On: 06/18/2015 19:43    Results for IZZABELLA, BESSE (MRN [id number]) as of 06/18/2015 20:59  Ref. Range 03/16/2015 06:43 06/04/2015 10:37 06/18/2015 18:20  BUN Latest Ref Range: 6-20 mg/dL 20 17 25  (H)  Creatinine Latest Ref Range: 0.44-1.00 mg/dL 12-07-1978 (H) 7.86 (H) 7.67 (H)    2045:  Not orthostatic on VS. +UTI, UC pending; IV rocephin given. Judicious IVF given for elevated lactic acid. Dx and testing d/w pt and family.  Questions answered.  Verb understanding, agreeable to admit. T/C to [facility] [address]. 2046, case discussed, including:  HPI, pertinent PM/SHx, VS/PE, dx testing, ED course and treatment:  Agreeable to  admit, requests to write temporary orders, obtain medical bed to team [facility].   Adrian Blackwater, DO 06/20/15 0129

## 2015-06-18 NOTE — ED Notes (Signed)
Critical lactic acid 3.0

## 2015-06-18 NOTE — H&P (Signed)
History and Physical  Mayuri Staples JQB:[id number] DOB: Oct 18, 1930 DOA: 06/18/2015  Referring physician: Dr. Clarene Duke, ED physician PCP: [name], MD  Outpatient Specialists:   Dr. Karilyn Cota  Dr Galen Manila  Dr Purvis Sheffield  Chief Complaint: Weakness  HPI: Jurnie Garritano is a 82 y.o. female with a history of GI bleeds, essential hypertension, DJD, second degree heart block, rheumatoid arthritis, PAF (CHADS 2 VASC score of 4) not on anticoagulation secondary to GI bleeds, stage III chronic kidney disease, steroid-induced myopathy, feet pain, ambulation with walker secondary to feet pain.  Patient seen for increasing weakness in lower extremities for the past 24 hours. This was unable to stand and walk this morning and states that her legs just gave out. No. Using a provoking factors. Patient walks with walker at baseline. 2 days ago was started on ciprofloxacin for UTI and was also started on Neurontin for chronic leg pain. Denies fevers, chills, nausea, vomiting, diarrhea, abdominal pain.   Review of Systems:   Pt denies any fevers, chills, nausea, vomiting, diarrhea, constipation, abdominal pain, shortness of breath, dyspnea on exertion, orthopnea, cough, wheezing, palpitations, headache, vision changes, lightheadedness, dizziness, melena, rectal bleeding.  Review of systems are otherwise negative  Past Medical History  Diagnosis Date  . History of GI bleed     Associated with NSAIDS  . Iron deficiency anemia     Transfusion dependent  . DJD (degenerative joint disease)   . Essential hypertension, benign   . History of colitis   . Ileitis   . Pancytopenia   . Autoimmune hepatitis (HCC)     With leukocytoclastic vasculitis  . Heart block AV second degree March 2013    a. Cardiology consult note 06/2013: "Question of previous second degree heart block, although review of cardiology notes indicates that this may have been actually blocked PACs when the patient was on verapamil."  .  Bronchitis   . Steroid-induced myopathy   . Renal calculus 08/12/2011  . Rheumatoid arthritis(714.0)   . Colon ulcer 04/2010    NSAID related  . Gastric ulcer 04/2010    NSAID related  . UGI bleed 09/20/2011    Focal area of gastritis oozing of blood  . Gastric AVM 09/20/2011  . Candida esophagitis (HCC) 09/20/2011  . Paroxysmal atrial fibrillation (HCC)     Not on anticoag 2/2 hx of GIB/AVM  . CKD (chronic kidney disease), stage III   . Cholestatic hepatitis   . UTI (lower urinary tract infection)     history  . Paroxysmal atrial flutter (HCC)     Not on anticoag 2/2 hx of GIB/AVM - dx 06/2013.  . Multifocal atrial tachycardia (HCC)   . Hypertrophic cardiomyopathy (HCC)     Echo 03/2011: severe LVH suggesting possble infiltrate cardiomyopathy or advanced hypertensive heart disease, increased  echogenicity of the ventricular septum as well as the pericardium, EF >70% with end systolicmid-cavitary obliteration of the ventricle, stage 1 DD, mild MR, small IVC.  Marland Kitchen Hypomagnesemia   . Chronic renal disease, stage 3, moderately decreased glomerular filtration rate between 30-59 mL/min/1.73 square meter 02/03/2014  . Anemia of chronic disease 11/25/2011  . Anemia of chronic renal failure, stage 3 (moderate) 02/03/2014  . Low back pain   . 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 Biatrial enlargement 04/04/2014  . History of pneumonia 12/2013  . Atrial fibrillation [facility])    Past Surgical History  Procedure Laterality Date  . Colonoscopy  04/2010  . Upper gastrointestinal endoscopy  04/2010  .  Esophagogastroduodenoscopy  09/20/2011    Status post APC.  Marland Kitchen Esophagogastroduodenoscopy  09/20/2011    Procedure: ESOPHAGOGASTRODUODENOSCOPY (EGD);  Surgeon: Malissa Hippo, MD;  Location: [facility];  Service: Endoscopy;  Laterality: N/A;  . Cyst removal hand      Elbow  . Sebaceous cyst removed      bilateral elbows  . Colonoscopy N/A 07/04/2014    Procedure: COLONOSCOPY;  Surgeon: Malissa Hippo, MD;   Location: [facility];  Service: Endoscopy;  Laterality: N/A;  730  . Esophagogastroduodenoscopy N/A 07/04/2014    Procedure: ESOPHAGOGASTRODUODENOSCOPY (EGD);  Surgeon: Malissa Hippo, MD;  Location: [facility];  Service: Endoscopy;  Laterality: N/A;  . Givens capsule study N/A 07/14/2014    Procedure: GIVENS CAPSULE STUDY;  Surgeon: Malissa Hippo, MD;  Location: [facility];  Service: Endoscopy;  Laterality: N/A;  730   Social History:  reports that she has been smoking Cigarettes.  She started smoking about 70 years ago. She has a 12.5 pack-year smoking history. She has never used smokeless tobacco. She reports that she does not drink alcohol or use illicit drugs. Patient lives at Home  Allergies  Allergen Reactions  . Iohexol Swelling    IV Dye   . Ivp Dye [Iodinated Diagnostic Agents] Swelling    Hives  . Naprosyn [Naproxen] Hives and Itching  . Red Blood Cells Swelling and Dermatitis    With blood transfusion 2012  . Verapamil     Heart block (2nd degree) Takes Cardizem   . Vicodin [Hydrocodone-Acetaminophen] Hives and Itching  . Sulfa Antibiotics Itching and Rash    Family History  Problem Relation Age of Onset  . Stroke Mother   . Hypertension Sister   . Hypertension Brother   . Heart disease Mother 17  . Heart disease Father 95  . COPD Brother   . Arthritis Brother   . Osteoporosis Sister      Prior to Admission medications   Medication Sig Start Date End Date Taking? Authorizing Provider  acetaminophen (TYLENOL) 325 MG tablet Take 650 mg by mouth every 6 (six) hours as needed. For pain   Yes Historical Provider, MD  ALPRAZolam (XANAX) 0.25 MG tablet Take 0.125 mg by mouth daily as needed for anxiety.  06/16/15  Yes Historical Provider, MD  aspirin 81 MG chewable tablet Chew 2 tablets (162 mg total) by mouth daily. 04/04/14  Yes Elliot Cousin, MD  azaTHIOprine (IMURAN) 50 MG tablet TAKE 1 AND 1/2 TABLETS BY MOUTH ONCE DAILY 03/06/15  Yes Len Blalock, NP    cetirizine (ZYRTEC) 10 MG tablet Take 10 mg by mouth daily as needed for allergies.   Yes Historical Provider, MD  Cholecalciferol (VITAMIN D) 2000 UNITS tablet Take 2,000 Units by mouth daily.   Yes Historical Provider, MD  ciprofloxacin (CIPRO) 250 MG tablet Take 250 mg by mouth 2 (two) times daily. 10 day course starting on 06/16/15 06/16/15  Yes Historical Provider, MD  Coenzyme Q10 200 MG capsule Take 200 mg by mouth daily.   Yes Historical Provider, MD  Darbepoetin Alfa (ARANESP) 500 MCG/ML SOSY injection Inject 500 mcg into the skin every 21 ( twenty-one) days. Reported on 05/12/2015   Yes Historical Provider, MD  DEXILANT 60 MG capsule TAKE 1 CAPSULE BY MOUTH EVERY OTHER DAY 10/21/14  Yes Malissa Hippo, MD  diltiazem (TIAZAC) 360 MG 24 hr capsule Take 1 capsule (360 mg total) by mouth daily. 01/06/14  Yes Hollice Espy, MD  fish oil-omega-3 fatty acids 1000 MG capsule Take 1 g by mouth daily.   Yes Historical Provider, MD  folic acid (FOLVITE) 800 MCG tablet Take 800 mcg by mouth daily.    Yes Historical Provider, MD  furosemide (LASIX) 20 MG tablet Take 10-20 mg by mouth daily as needed for fluid. For fluid retention 04/26/11  Yes Malissa Hippo, MD  gabapentin (NEURONTIN) 100 MG capsule Take 100 mg by mouth at bedtime.  06/16/15  Yes Historical Provider, MD  lidocaine-prilocaine (EMLA) cream Apply 1 application topically as needed (pain).  06/20/14  Yes Historical Provider, MD  Magnesium 400 MG TABS Take 1 tablet by mouth daily.    Yes Historical Provider, MD  metoprolol (LOPRESSOR) 50 MG tablet Take 1 tablet 2 times daily on Saturday and Sunday and resume one and a half tablets 2 times daily on Monday 04/06/14. Patient taking differently: Take 50 mg by mouth 2 (two) times daily.  04/04/14  Yes Elliot Cousin, MD  Multiple Vitamins-Minerals (MULTIVITAMIN WITH MINERALS) tablet Take 1 tablet by mouth daily.     Yes Historical Provider, MD  oxyCODONE-acetaminophen (PERCOCET/ROXICET) 5-325 MG tablet  Take 1-2 tablets by mouth every 4 (four) hours as needed for severe pain. Patient taking differently: Take 0.5-1 tablets by mouth every 4 (four) hours as needed for severe pain.  03/17/15  Yes Marinda Elk, MD  polyethylene glycol [facility] Ethelene Hal) packet Take 17 g by mouth daily. Patient taking differently: Take 17 g by mouth daily as needed for mild constipation or moderate constipation.  03/17/15  Yes Marinda Elk, MD  potassium chloride SA (K-DUR,KLOR-CON) 20 MEQ tablet TAKE 1 TABLET BY MOUTH ONCE DAILY Patient taking differently: TAKE 1 TABLET BY MOUTH ONCE DAILY. *Takes three to four times weekly when taking Lasix (Furosemide) 05/28/15  Yes Malissa Hippo, MD  predniSONE (DELTASONE) 5 MG tablet Take 7.5 mg by mouth daily with breakfast.  03/13/15  Yes Historical Provider, MD  spironolactone (ALDACTONE) 50 MG tablet TAKE 1 TABLET BY MOUTH DAILY 02/25/15  Yes Malissa Hippo, MD  Travoprost, BAK Free, (TRAVATAN) 0.004 % SOLN ophthalmic solution Place 1 drop into both eyes at bedtime. 09/20/11  Yes Elliot Cousin, MD  ursodiol (ACTIGALL) 300 MG capsule Take 300 mg by mouth 2 (two) times daily.   Yes Historical Provider, MD  bisacodyl (DULCOLAX) 10 MG suppository Place 1 suppository (10 mg total) rectally as needed for moderate constipation. Patient not taking: Reported on 06/18/2015 05/12/15   Malissa Hippo, MD  fluticasone (FLONASE) 50 MCG/ACT nasal spray Place 2 sprays into both nostrils daily as needed for allergies.  03/26/14   Historical Provider, MD    Physical Exam: BP 122/74 mmHg  Pulse 65  Temp(Src) 98.3 F (36.8 C) (Oral)  Resp 15  Ht 5\' 5"  (1.651 m)  Wt 69.854 kg (154 lb)  BMI 25.63 kg/m2  SpO2 99%  General: Elderly black female. Awake and alert and oriented x3. No acute cardiopulmonary distress.  HEENT: Normocephalic atraumatic.  Right and left ears normal in appearance.  Pupils equal, round, reactive to light. Extraocular muscles are intact. Sclerae anicteric and  noninjected.  Moist mucosal membranes. No mucosal lesions.  Neck: Neck supple without lymphadenopathy. No carotid bruits. No masses palpated.  Cardiovascular: Regular rate with normal S1-S2 sounds. No murmurs, rubs, gallops auscultated. No JVD.  Respiratory: Good respiratory effort with no wheezes, rales, rhonchi. Lungs clear to auscultation bilaterally.  No accessory muscle use. Abdomen: Soft, nontender, nondistended. Active bowel  sounds. No masses or hepatosplenomegaly  Skin: No rashes, lesions, or ulcerations.  Dry, warm to touch. 2+ dorsalis pedis and radial pulses. Musculoskeletal: No calf or leg pain. All major joints not erythematous nontender.  No upper or lower joint deformation.  Good ROM.  No contractures  Psychiatric: Intact judgment and insight. Pleasant and cooperative. Neurologic: No focal neurological deficits. Strength is 4/5 in lower extremities. Strength 5 out of 5 and symmetric in upper.  Cranial nerves II through XII are grossly intact.           Labs on Admission: I have personally reviewed following labs and imaging studies  CBC:  Recent Labs Lab 06/18/15 1820  WBC 4.5  NEUTROABS 3.9  HGB 12.1  HCT 35.8*  MCV 84.2  PLT 61*   Basic Metabolic Panel:  Recent Labs Lab 06/18/15 1820  NA 132*  K 4.8  CL 95*  CO2 29  GLUCOSE 152*  BUN 25*  CREATININE 1.58*  CALCIUM 10.9*   GFR: Estimated Creatinine Clearance: 27 mL/min (by C-G formula based on Cr of 1.58). Liver Function Tests:  Recent Labs Lab 06/18/15 1820  AST 256*  ALT 26  ALKPHOS 85  BILITOT 2.0*  PROT 5.9*  ALBUMIN 2.7*   No results for input(s): LIPASE, AMYLASE in the last 168 hours. No results for input(s): AMMONIA in the last 168 hours. Coagulation Profile: No results for input(s): INR, PROTIME in the last 168 hours. Cardiac Enzymes:  Recent Labs Lab 06/18/15 1820  TROPONINI 0.05*   BNP (last 3 results) No results for input(s): PROBNP in the last 8760 hours. HbA1C: No  results for input(s): HGBA1C in the last 72 hours. CBG:  Recent Labs Lab 06/18/15 1739  GLUCAP 168*   Lipid Profile: No results for input(s): CHOL, HDL, LDLCALC, TRIG, CHOLHDL, LDLDIRECT in the last 72 hours. Thyroid Function Tests: No results for input(s): TSH, T4TOTAL, FREET4, T3FREE, THYROIDAB in the last 72 hours. Anemia Panel: No results for input(s): VITAMINB12, FOLATE, FERRITIN, TIBC, IRON, RETICCTPCT in the last 72 hours. Urine analysis:    Component Value Date/Time   COLORURINE YELLOW 06/18/2015 1849   APPEARANCEUR CLEAR 06/18/2015 1849   LABSPEC 1.015 06/18/2015 1849   PHURINE 6.0 06/18/2015 1849   GLUCOSEU NEGATIVE 06/18/2015 1849   HGBUR NEGATIVE 06/18/2015 1849   BILIRUBINUR NEGATIVE 06/18/2015 1849   KETONESUR TRACE* 06/18/2015 1849   PROTEINUR NEGATIVE 06/18/2015 1849   UROBILINOGEN 0.2 07/18/2014 1420   NITRITE NEGATIVE 06/18/2015 1849   LEUKOCYTESUR SMALL* 06/18/2015 1849   Sepsis Labs: (procalcitonin:4,lacticidven:4) )No results found for this or any previous visit (from the past 240 hour(s)).   Radiological Exams on Admission: Dg Chest 2 View  06/18/2015  CLINICAL DATA:  Acute onset of bilateral lower extremity weakness and swelling. Initial encounter. EXAM: CHEST  2 VIEW COMPARISON:  Chest radiograph performed 03/15/2015 FINDINGS: The lungs are well-aerated. Pulmonary vascularity is at the upper limits of normal. There is no evidence of focal opacification, pleural effusion or pneumothorax. The heart is enlarged.  No acute osseous abnormalities are seen. IMPRESSION: Cardiomegaly.  Lungs remain grossly clear. Electronically Signed   By: Roanna Raider M.D.   On: 06/18/2015 19:22   Ct Head Wo Contrast  06/18/2015  CLINICAL DATA:  Bilateral leg weakness. EXAM: CT HEAD WITHOUT CONTRAST TECHNIQUE: Contiguous axial images were obtained from the base of the skull through the vertex without intravenous contrast. COMPARISON:  03/13/2015 FINDINGS: Brain: No  evidence of acute infarction, hemorrhage, extra-axial collection, ventriculomegaly, or mass effect.  There is moderate brain parenchymal atrophy and mild bilateral deep white matter microvascular ischemia. Vascular: Vascular calcifications at the skullbase. Skull: Negative for fracture or focal lesion. Sinuses/Orbits: No acute findings. Other: None. IMPRESSION: No acute intracranial abnormality. Atrophy, chronic microvascular disease. Electronically Signed   By: Ted Mcalpine M.D.   On: 06/18/2015 19:43    EKG: Independently reviewed. A. Fib/ flutter.  Assessment/Plan: Principal Problem:   UTI (urinary tract infection) Active Problems:   Weakness of both lower extremities   Lactic acidosis   Elevated troponin I level   Acute renal failure superimposed on stage 3 chronic kidney disease (HCC)    This patient was discussed with the ED physician, including pertinent vitals, physical exam findings, labs, and imaging.  We also discussed care given by the ED provider.  #1 UTI  Admit  Continue ceftriaxone  Urine culture #2 weakness in both lower extremities  Likely secondary to infection  If not clearing, consult PT  At family's request, will obtain B-12 and folic acid levels, although this would not precipitate an acute change in strength  Also check TSH #3 lactic acidosis  Fluid resuscitate  Recheck lactic acid levels in 3 hours #4 elevated troponin I level  Likely secondary to infection  Recheck in 6 hours #5 acute on chronic renal failure  Recheck creatinine in the morning   DVT prophylaxis: Patient's family declines pharmacological anticoagulation. SCDs. Consultants: None Code Status: Full code Family Communication: Daughters in the room  Disposition Plan: Admit   Levie Heritage, DO [facility] Pager [phone]  If 7PM-7AM, please contact night-coverage [email or web address] Password [email or web address]

## 2015-06-19 ENCOUNTER — Observation Stay (HOSPITAL_COMMUNITY): Payer: Medicare Other

## 2015-06-19 DIAGNOSIS — R29898 Other symptoms and signs involving the musculoskeletal system: Secondary | ICD-10-CM | POA: Diagnosis not present

## 2015-06-19 DIAGNOSIS — R7989 Other specified abnormal findings of blood chemistry: Secondary | ICD-10-CM

## 2015-06-19 DIAGNOSIS — N183 Chronic kidney disease, stage 3 (moderate): Secondary | ICD-10-CM | POA: Diagnosis present

## 2015-06-19 DIAGNOSIS — I422 Other hypertrophic cardiomyopathy: Secondary | ICD-10-CM | POA: Diagnosis not present

## 2015-06-19 DIAGNOSIS — E872 Acidosis: Secondary | ICD-10-CM | POA: Diagnosis present

## 2015-06-19 DIAGNOSIS — I4892 Unspecified atrial flutter: Secondary | ICD-10-CM | POA: Diagnosis present

## 2015-06-19 DIAGNOSIS — Z8262 Family history of osteoporosis: Secondary | ICD-10-CM | POA: Diagnosis not present

## 2015-06-19 DIAGNOSIS — I129 Hypertensive chronic kidney disease with stage 1 through stage 4 chronic kidney disease, or unspecified chronic kidney disease: Secondary | ICD-10-CM | POA: Diagnosis present

## 2015-06-19 DIAGNOSIS — D631 Anemia in chronic kidney disease: Secondary | ICD-10-CM | POA: Diagnosis not present

## 2015-06-19 DIAGNOSIS — J449 Chronic obstructive pulmonary disease, unspecified: Secondary | ICD-10-CM | POA: Diagnosis present

## 2015-06-19 DIAGNOSIS — R600 Localized edema: Secondary | ICD-10-CM | POA: Diagnosis not present

## 2015-06-19 DIAGNOSIS — M549 Dorsalgia, unspecified: Secondary | ICD-10-CM | POA: Diagnosis not present

## 2015-06-19 DIAGNOSIS — Z825 Family history of asthma and other chronic lower respiratory diseases: Secondary | ICD-10-CM | POA: Diagnosis not present

## 2015-06-19 DIAGNOSIS — Z882 Allergy status to sulfonamides status: Secondary | ICD-10-CM | POA: Diagnosis not present

## 2015-06-19 DIAGNOSIS — Z87442 Personal history of urinary calculi: Secondary | ICD-10-CM | POA: Diagnosis not present

## 2015-06-19 DIAGNOSIS — Z7982 Long term (current) use of aspirin: Secondary | ICD-10-CM | POA: Diagnosis not present

## 2015-06-19 DIAGNOSIS — N179 Acute kidney failure, unspecified: Secondary | ICD-10-CM | POA: Diagnosis present

## 2015-06-19 DIAGNOSIS — Z9889 Other specified postprocedural states: Secondary | ICD-10-CM | POA: Diagnosis not present

## 2015-06-19 DIAGNOSIS — M545 Low back pain: Secondary | ICD-10-CM | POA: Diagnosis not present

## 2015-06-19 DIAGNOSIS — R05 Cough: Secondary | ICD-10-CM | POA: Diagnosis not present

## 2015-06-19 DIAGNOSIS — Z8701 Personal history of pneumonia (recurrent): Secondary | ICD-10-CM | POA: Diagnosis not present

## 2015-06-19 DIAGNOSIS — Z7951 Long term (current) use of inhaled steroids: Secondary | ICD-10-CM | POA: Diagnosis not present

## 2015-06-19 DIAGNOSIS — Z8711 Personal history of peptic ulcer disease: Secondary | ICD-10-CM | POA: Diagnosis not present

## 2015-06-19 DIAGNOSIS — D638 Anemia in other chronic diseases classified elsewhere: Secondary | ICD-10-CM | POA: Diagnosis present

## 2015-06-19 DIAGNOSIS — Z8673 Personal history of transient ischemic attack (TIA), and cerebral infarction without residual deficits: Secondary | ICD-10-CM | POA: Diagnosis not present

## 2015-06-19 DIAGNOSIS — F17210 Nicotine dependence, cigarettes, uncomplicated: Secondary | ICD-10-CM | POA: Diagnosis present

## 2015-06-19 DIAGNOSIS — M069 Rheumatoid arthritis, unspecified: Secondary | ICD-10-CM | POA: Diagnosis present

## 2015-06-19 DIAGNOSIS — N10 Acute tubulo-interstitial nephritis: Secondary | ICD-10-CM

## 2015-06-19 DIAGNOSIS — D500 Iron deficiency anemia secondary to blood loss (chronic): Secondary | ICD-10-CM | POA: Diagnosis present

## 2015-06-19 DIAGNOSIS — Z8249 Family history of ischemic heart disease and other diseases of the circulatory system: Secondary | ICD-10-CM | POA: Diagnosis not present

## 2015-06-19 DIAGNOSIS — G8929 Other chronic pain: Secondary | ICD-10-CM | POA: Diagnosis present

## 2015-06-19 DIAGNOSIS — Z79899 Other long term (current) drug therapy: Secondary | ICD-10-CM | POA: Diagnosis not present

## 2015-06-19 DIAGNOSIS — I480 Paroxysmal atrial fibrillation: Secondary | ICD-10-CM | POA: Diagnosis not present

## 2015-06-19 DIAGNOSIS — N390 Urinary tract infection, site not specified: Secondary | ICD-10-CM | POA: Diagnosis present

## 2015-06-19 DIAGNOSIS — Z8719 Personal history of other diseases of the digestive system: Secondary | ICD-10-CM | POA: Diagnosis not present

## 2015-06-19 DIAGNOSIS — Z8261 Family history of arthritis: Secondary | ICD-10-CM | POA: Diagnosis not present

## 2015-06-19 DIAGNOSIS — Z823 Family history of stroke: Secondary | ICD-10-CM | POA: Diagnosis not present

## 2015-06-19 LAB — BASIC METABOLIC PANEL
Anion gap: 7 (ref 5–15)
BUN: 21 mg/dL — ABNORMAL HIGH (ref 6–20)
CO2: 25 mmol/L (ref 22–32)
Calcium: 9.2 mg/dL (ref 8.9–10.3)
Chloride: 103 mmol/L (ref 101–111)
Creatinine, Ser: 1.11 mg/dL — ABNORMAL HIGH (ref 0.44–1.00)
GFR calc Af Amer: 52 mL/min — ABNORMAL LOW (ref >60)
GFR calc non Af Amer: 45 mL/min — ABNORMAL LOW (ref >60)
Glucose, Bld: 126 mg/dL — ABNORMAL HIGH (ref 65–99)
Potassium: 4.1 mmol/L (ref 3.5–5.1)
Sodium: 135 mmol/L (ref 135–145)

## 2015-06-19 LAB — HEPATIC FUNCTION PANEL
ALBUMIN: 2.2 g/dL — AB (ref 3.5–5.0)
ALT: 20 U/L (ref 14–54)
AST: 202 U/L — AB (ref 15–41)
Alkaline Phosphatase: 63 U/L (ref 38–126)
BILIRUBIN DIRECT: 0.6 mg/dL — AB (ref 0.1–0.5)
Indirect Bilirubin: 0.9 mg/dL (ref 0.3–0.9)
Total Bilirubin: 1.5 mg/dL — ABNORMAL HIGH (ref 0.3–1.2)
Total Protein: 4.8 g/dL — ABNORMAL LOW (ref 6.5–8.1)

## 2015-06-19 LAB — CBC
HCT: 31.4 % — ABNORMAL LOW (ref 36.0–46.0)
Hemoglobin: 10.6 g/dL — ABNORMAL LOW (ref 12.0–15.0)
MCH: 28.4 pg (ref 26.0–34.0)
MCHC: 33.8 g/dL (ref 30.0–36.0)
MCV: 84.2 fL (ref 78.0–100.0)
Platelets: 48 10*3/uL — ABNORMAL LOW (ref 150–400)
RBC: 3.73 MIL/uL — ABNORMAL LOW (ref 3.87–5.11)
RDW: 22.8 % — ABNORMAL HIGH (ref 11.5–15.5)
WBC: 3.7 10*3/uL — ABNORMAL LOW (ref 4.0–10.5)

## 2015-06-19 LAB — VITAMIN B12: VITAMIN B 12: 1336 pg/mL — AB (ref 180–914)

## 2015-06-19 LAB — FOLATE: Folate: 32.2 ng/mL (ref >5.9)

## 2015-06-19 LAB — LACTIC ACID, PLASMA
LACTIC ACID, VENOUS: 2.7 mmol/L — AB (ref 0.5–2.0)
Lactic Acid, Venous: 1.4 mmol/L (ref 0.5–2.0)

## 2015-06-19 LAB — TSH: TSH: 0.977 u[IU]/mL (ref 0.350–4.500)

## 2015-06-19 MED ORDER — DEXTROSE 5 % IV SOLN
1.0000 g | INTRAVENOUS | Status: DC
  Administered 2015-06-19 – 2015-06-21 (×3): 1 g via INTRAVENOUS
  Filled 2015-06-19 (×4): qty 10

## 2015-06-19 MED ORDER — DOCUSATE SODIUM 100 MG PO CAPS
100.0000 mg | ORAL_CAPSULE | Freq: Two times a day (BID) | ORAL | Status: DC
  Administered 2015-06-19 – 2015-06-22 (×7): 100 mg via ORAL
  Filled 2015-06-19 (×7): qty 1

## 2015-06-19 MED ORDER — LATANOPROST 0.005 % OP SOLN
OPHTHALMIC | Status: AC
  Filled 2015-06-19: qty 2.5

## 2015-06-19 MED ORDER — URSODIOL 300 MG PO CAPS
ORAL_CAPSULE | ORAL | Status: AC
  Filled 2015-06-19: qty 1

## 2015-06-19 NOTE — Progress Notes (Signed)
Critical lactic acid 2.7

## 2015-06-19 NOTE — Progress Notes (Signed)
[facility] PROGRESS NOTE  Eileen Santana OAC:[id number] DOB: 08-14-1930 DOA: 06/18/2015   PCP: [name], MD     Assessment/Plan: Principal Problem:   UTI (urinary tract infection) Active Problems:   Weakness of both lower extremities   Lactic acidosis   Elevated troponin I level   Acute renal failure superimposed on stage 3 chronic kidney disease (HCC)    Eileen Santana is a 82 y.o. female with a history of GI bleeds, essential hypertension, DJD, second degree heart block, rheumatoid arthritis, PAF (CHADS 2 VASC score of 4) not on anticoagulation secondary to GI bleeds, stage III chronic kidney disease, steroid-induced myopathy, feet pain, ambulation with walker secondary to feet pain. Patient seen for increasing weakness in lower extremities for the past 24 hours. This was unable to stand and walk this morning and states that her legs just gave out.  2 days ago was started on ciprofloxacin for UTI and was also started on Neurontin for chronic leg pain. Denies fevers, chills, nausea, vomiting, diarrhea, abdominal pain.   Assessment and plan Acute kidney injury-improved with IV hydration Creatinine 1.58> 1.1  Urinary tract infection-continue Rocephin, follow urine culture results  Bilateral lower extremity weakness/chronic back pain: Likely exacerbated by UTI and acute kidney injury  MRI of the lumbar spine 03/15/15 shows a stress sacral fracture at S3. Large extraforaminal L4-L5 disc protrusion She now focal deficit on neurological exam. MRI results were discussed by Dr. Robb Matar with neurosurgery who recommended no surgical intervention at this time. Patient was discharged to SNF Patient recently started on Neurontin at bedtime, extremely low-dose therefore likely not contributing to her symptoms Will repeat plain x-rays of the sacral and the lumbar spine and pelvis to rule out any new stress fracture PT OT evaluation   Essential hypertension: No changes were made  to her regimen.  Chronic thrombocytopenia: She will need to follow-up with her PCP as an outpatient.  Anemia of chronic disease Seems to be at baseline.  Atrial flutter (HCC) Rate control continue aspirin.  COPD (chronic obstructive pulmonary disease) (HCC): No evidence of exacerbation. Continue bronchodilators as needed.   Chronic renal disease, stage 3, moderately decreased glomerular filtration rate between 30-59 mL/min/1.73 square meter Appears to be at baseline.  Chronically elevated AST -unchanged from baseline  History of rheumatoid arthritis-patient chronically on prednisone, imuran   DVT prophylaxsis  SCDs  due to low platelets   Code Status:  Full code    Family Communication: Discussed in detail with the patient/daughter, all imaging results, lab results explained to the patient   Disposition Plan: Anticipate discharge tomorrow      Consultants:  None   Procedures:  None   Antibiotics: Anti-infectives    Start     Dose/Rate Route Frequency Ordered Stop   06/19/15 2000  cefTRIAXone (ROCEPHIN) 1 g in dextrose 5 % 50 mL IVPB     1 g 100 mL/hr over 30 Minutes Intravenous Every 24 hours 06/18/15 0743         HPI/Subjective: Minimal back pain, feels better today, afebrile, no chest pain or shortness of breath  Objective: Filed Vitals:   06/18/15 2030 06/18/15 2100 06/18/15 2316 06/19/15 0649  BP: 123/76 119/73 123/71 119/62  Pulse: 83 92 87 102  Temp:   98.2 F (36.8 C) 98.6 F (37 C)  TempSrc:   Oral Oral  Resp: 21 16 16 16   Height:   5\' 5"  (1.651 m)   Weight:   67.6 kg (  149 lb 0.5 oz)   SpO2: 99% 100% 92% 95%   No intake or output data in the 24 hours ending 06/19/15 0849  Exam:  Examination:  General exam: Appears calm and comfortable  Respiratory system: Clear to auscultation. Respiratory effort normal. Cardiovascular system: S1 & S2 heard, RRR. No JVD, murmurs, rubs, gallops or clicks. No pedal edema. Gastrointestinal system:  Abdomen is nondistended, soft and nontender. No organomegaly or masses felt. Normal bowel sounds heard. Central nervous system: Alert and oriented. No focal neurological deficits. Extremities: Symmetric 5 x 5 power. Skin: No rashes, lesions or ulcers Psychiatry: Judgement and insight appear normal. Mood & affect appropriate.     Data Reviewed: I have personally reviewed following labs and imaging studies  Micro Results No results found for this or any previous visit (from the past 240 hour(s)).  Radiology Reports Dg Chest 2 View  06/18/2015  CLINICAL DATA:  Acute onset of bilateral lower extremity weakness and swelling. Initial encounter. EXAM: CHEST  2 VIEW COMPARISON:  Chest radiograph performed 03/15/2015 FINDINGS: The lungs are well-aerated. Pulmonary vascularity is at the upper limits of normal. There is no evidence of focal opacification, pleural effusion or pneumothorax. The heart is enlarged.  No acute osseous abnormalities are seen. IMPRESSION: Cardiomegaly.  Lungs remain grossly clear. Electronically Signed   By: Roanna Raider M.D.   On: 06/18/2015 19:22   Ct Head Wo Contrast  06/18/2015  CLINICAL DATA:  Bilateral leg weakness. EXAM: CT HEAD WITHOUT CONTRAST TECHNIQUE: Contiguous axial images were obtained from the base of the skull through the vertex without intravenous contrast. COMPARISON:  03/13/2015 FINDINGS: Brain: No evidence of acute infarction, hemorrhage, extra-axial collection, ventriculomegaly, or mass effect. There is moderate brain parenchymal atrophy and mild bilateral deep white matter microvascular ischemia. Vascular: Vascular calcifications at the skullbase. Skull: Negative for fracture or focal lesion. Sinuses/Orbits: No acute findings. Other: None. IMPRESSION: No acute intracranial abnormality. Atrophy, chronic microvascular disease. Electronically Signed   By: Ted Mcalpine M.D.   On: 06/18/2015 19:43     CBC  Recent Labs Lab 06/18/15 1820 06/19/15 0347   WBC 4.5 3.7*  HGB 12.1 10.6*  HCT 35.8* 31.4*  PLT 61* 48*  MCV 84.2 84.2  MCH 28.5 28.4  MCHC 33.8 33.8  RDW 22.9* 22.8*  LYMPHSABS 0.2*  --   MONOABS 0.4  --   EOSABS 0.0  --   BASOSABS 0.0  --     Chemistries   Recent Labs Lab 06/18/15 1820 06/19/15 0347  NA 132* 135  K 4.8 4.1  CL 95* 103  CO2 29 25  GLUCOSE 152* 126*  BUN 25* 21*  CREATININE 1.58* 1.11*  CALCIUM 10.9* 9.2  AST 256*  --   ALT 26  --   ALKPHOS 85  --   BILITOT 2.0*  --    ------------------------------------------------------------------------------------------------------------------ estimated creatinine clearance is 35.2 mL/min (by C-G formula based on Cr of 1.11). ------------------------------------------------------------------------------------------------------------------ No results for input(s): HGBA1C in the last 72 hours. ------------------------------------------------------------------------------------------------------------------ No results for input(s): CHOL, HDL, LDLCALC, TRIG, CHOLHDL, LDLDIRECT in the last 72 hours. ------------------------------------------------------------------------------------------------------------------  Recent Labs  06/19/15 0347  TSH 0.977   ------------------------------------------------------------------------------------------------------------------ No results for input(s): VITAMINB12, FOLATE, FERRITIN, TIBC, IRON, RETICCTPCT in the last 72 hours.  Coagulation profile No results for input(s): INR, PROTIME in the last 168 hours.  No results for input(s): DDIMER in the last 72 hours.  Cardiac Enzymes  Recent Labs Lab 06/18/15 1820 06/18/15 2129  TROPONINI 0.05* 0.03   ------------------------------------------------------------------------------------------------------------------  Invalid input(s): POCBNP   CBG:  Recent Labs Lab 06/18/15 1739  GLUCAP 168*       Studies: Dg Chest 2 View  06/18/2015  CLINICAL DATA:   Acute onset of bilateral lower extremity weakness and swelling. Initial encounter. EXAM: CHEST  2 VIEW COMPARISON:  Chest radiograph performed 03/15/2015 FINDINGS: The lungs are well-aerated. Pulmonary vascularity is at the upper limits of normal. There is no evidence of focal opacification, pleural effusion or pneumothorax. The heart is enlarged.  No acute osseous abnormalities are seen. IMPRESSION: Cardiomegaly.  Lungs remain grossly clear. Electronically Signed   By: Roanna Raider M.D.   On: 06/18/2015 19:22   Ct Head Wo Contrast  06/18/2015  CLINICAL DATA:  Bilateral leg weakness. EXAM: CT HEAD WITHOUT CONTRAST TECHNIQUE: Contiguous axial images were obtained from the base of the skull through the vertex without intravenous contrast. COMPARISON:  03/13/2015 FINDINGS: Brain: No evidence of acute infarction, hemorrhage, extra-axial collection, ventriculomegaly, or mass effect. There is moderate brain parenchymal atrophy and mild bilateral deep white matter microvascular ischemia. Vascular: Vascular calcifications at the skullbase. Skull: Negative for fracture or focal lesion. Sinuses/Orbits: No acute findings. Other: None. IMPRESSION: No acute intracranial abnormality. Atrophy, chronic microvascular disease. Electronically Signed   By: Ted Mcalpine M.D.   On: 06/18/2015 19:43      Lab Results  Component Value Date   HGBA1C 6.5* 03/05/2011   Lab Results  Component Value Date   MICROALBUR 0.70 03/05/2011   LDLCALC 120* 03/06/2011   CREATININE 1.11* 06/19/2015       Scheduled Meds: . sodium chloride   Intravenous STAT  . aspirin  162 mg Oral Daily  . azaTHIOprine  75 mg Oral Daily  . cefTRIAXone (ROCEPHIN)  IV  1 g Intravenous Q24H  . diltiazem (CARDIZEM CD) 24 hr capsule 360 mg  360 mg Oral Daily  . gabapentin  100 mg Oral QHS  . latanoprost  1 drop Both Eyes QHS  . loratadine  10 mg Oral Daily  . magnesium oxide  400 mg Oral Daily  . metoprolol  50 mg Oral BID  . pantoprazole   80 mg Oral Daily  . predniSONE  7.5 mg Oral Q breakfast  . spironolactone  50 mg Oral Daily  . ursodiol  300 mg Oral BID   Continuous Infusions: . sodium chloride 100 mL/hr at 06/19/15 0045        Time spent: >30 MINS    [facility]  [facility] Pager [phone]. If 7PM-7AM, please contact night-coverage at [email or web address], password [email or web address] 06/19/2015, 8:49 AM

## 2015-06-19 NOTE — [place] FL2 (Signed)
[place] MEDICAID FL2 LEVEL OF CARE SCREENING TOOL     IDENTIFICATION  Patient Name: Eileen Santana Birthdate: 1930-12-08 Sex: female Admission Date (Current Location): 06/18/2015  [place] and [place] Number:  [name] [id number] K Facility and Address:  [facility],  [address] [place] [address]      Provider Number: [id number]  Attending Physician Name and Address:  Richarda Overlie, MD  Relative Name and Phone Number:  Trevi Revolorio - daughter. Phone #[phone] (mobile)    Current Level of Care: Hospital Recommended Level of Care: Skilled Nursing Facility Prior Approval Number:    Date Approved/Denied:   PASRR Number:  [id number])  Discharge Plan: SNF    Current Diagnoses: Patient Active Problem List   Diagnosis Date Noted  . Lactic acidosis 06/18/2015  . Elevated troponin I level 06/18/2015  . Acute renal failure superimposed on stage 3 chronic kidney disease (HCC) 06/18/2015  . Weakness of both lower extremities   . Sacral fracture, closed (HCC) 03/16/2015  . Bilateral leg weakness 03/15/2015  . Blood loss anemia 07/18/2014  . 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 Biatrial enlargement 04/04/2014  . TIA (transient ischemic attack) 04/03/2014  . Adrenal insufficiency (HCC) 04/03/2014  . Aphasia 04/03/2014  . Chronic renal disease, stage 3, moderately decreased glomerular filtration rate between 30-59 mL/min/1.73 square meter 02/03/2014  . Anemia of chronic renal failure, stage 3 (moderate) 02/03/2014  . Atrial flutter with rapid ventricular response (HCC) 01/02/2014  . Atrial fibrillation with rapid ventricular response (HCC)   . Atrial fibrillation with RVR (HCC) 12/29/2013  . Healthcare-associated pneumonia 12/29/2013  . COPD (chronic obstructive pulmonary disease) (HCC) 12/29/2013  . HCAP (healthcare-associated pneumonia) 12/29/2013  . Atrial flutter (HCC) 06/12/2013  . Atrial tachycardia (HCC) 06/11/2013  . AVM (congenital  arteriovenous malformation) 10/12/2012  . Hypersplenism 09/21/2012  . Iron deficiency anemia due to chronic blood loss 09/21/2012  . PAT (paroxysmal atrial tachycardia) (HCC) 05/23/2012  . Hypertrophic cardiomyopathy (HCC) 05/23/2012  . Anemia of chronic disease 11/25/2011  . UGI bleed 09/20/2011  . Gastric AVM 09/20/2011  . Candida esophagitis (HCC) 09/20/2011  . Chronic back pain 09/18/2011  . Leg pain 09/18/2011  . Rheumatoid arthritis (HCC) 09/18/2011  . Acute renal insufficiency 09/18/2011  . Hypoalbuminemia 09/18/2011  . Guaiac positive stools 09/18/2011  . Renal calculus 08/12/2011  . Abdominal pain, acute, generalized 08/10/2011  . Nausea 08/10/2011  . Diarrhea 08/10/2011  . Hypomagnesemia 08/10/2011  . Hypokalemia 08/10/2011  . Prolonged Q-T interval on ECG 08/10/2011  . UTI (urinary tract infection) 08/10/2011  . Gallstones 08/10/2011  . Thrombocytopenia (HCC) 08/10/2011  . Orthostatic hypotension 03/05/2011  . Bradycardia 03/05/2011  . Renal failure 03/05/2011  . Autoimmune hepatitis (HCC) 03/05/2011  . HTN (hypertension), benign 03/05/2011  . Elevated liver enzymes 02/17/2011  . GI bleed 02/17/2011  . Microcytic anemia 02/17/2011  . Other pancytopenia (HCC) 09/03/2010  . Sedimentation rate elevation 09/03/2010    Orientation RESPIRATION BLADDER Height & Weight     Self, Time, Situation, Place  Normal Continent Weight: 149 lb 0.5 oz (67.6 kg) Height:  5\' 5"  (165.1 cm)  BEHAVIORAL SYMPTOMS/MOOD NEUROLOGICAL BOWEL NUTRITION STATUS      Continent Diet (Diet Heart Healthy/Carb Modified)  AMBULATORY STATUS COMMUNICATION OF NEEDS Skin   Extensive Assist Verbally Normal                       Personal Care Assistance Level of Assistance  Bathing, Feeding, Dressing Bathing Assistance: Limited assistance Feeding  assistance: Limited assistance Dressing Assistance: Limited assistance     Functional Limitations Info             SPECIAL CARE FACTORS  FREQUENCY  PT (By licensed PT)     PT Frequency: 5x/week               Contractures      Additional Factors Info  Code Status, Allergies, Psychotropic Code Status Info: Full Code  Allergies Info: Iohexol, Ivp Dye, Naprosyn, Red Blood Cells, Verapamil, Vicodin, Sulfa Antibiotics Psychotropic Info: Neurontin, Xanax         Current Medications (06/19/2015):  This is the current hospital active medication list Current Facility-Administered Medications  Medication Dose Route Frequency Provider Last Rate Last Dose  . 0.9 %  sodium chloride infusion   Intravenous Continuous Richarda Overlie, MD 100 mL/hr at 06/19/15 0857    . acetaminophen (TYLENOL) tablet 650 mg  650 mg Oral Q6H PRN Levie Heritage, DO       Or  . acetaminophen (TYLENOL) suppository 650 mg  650 mg Rectal Q6H PRN Rhona Raider Stinson, DO      . acetaminophen (TYLENOL) tablet 650 mg  650 mg Oral Q6H PRN Rhona Raider Stinson, DO      . ALPRAZolam Prudy Feeler) tablet 0.125 mg  0.125 mg Oral Daily PRN Levie Heritage, DO      . aspirin chewable tablet 162 mg  162 mg Oral Daily Rhona Raider Stinson, DO   162 mg at 06/19/15 0825  . azaTHIOprine (IMURAN) tablet 75 mg  75 mg Oral Daily Rhona Raider Stinson, DO   75 mg at 06/19/15 1000  . cefTRIAXone (ROCEPHIN) 1 g in dextrose 5 % 50 mL IVPB  1 g Intravenous Q24H Rhona Raider Stinson, DO      . diltiazem (CARDIZEM CD) 24 hr capsule 360 mg  360 mg Oral Daily Rhona Raider Stinson, DO   360 mg at 06/19/15 1856  . docusate sodium (COLACE) capsule 100 mg  100 mg Oral BID Richarda Overlie, MD   100 mg at 06/19/15 0945  . fluticasone (FLONASE) 50 MCG/ACT nasal spray 2 spray  2 spray Each Nare Daily PRN Levie Heritage, DO      . gabapentin (NEURONTIN) capsule 100 mg  100 mg Oral QHS Rhona Raider Stinson, DO   100 mg at 06/19/15 0044  . latanoprost (XALATAN) 0.005 % ophthalmic solution 1 drop  1 drop Both Eyes QHS Rhona Raider Stinson, DO   1 drop at 06/19/15 0047  . lidocaine-prilocaine (EMLA) cream 1 application  1 application Topical  PRN Rhona Raider Stinson, DO      . loratadine (CLARITIN) tablet 10 mg  10 mg Oral Daily Rhona Raider Stinson, DO   10 mg at 06/19/15 0825  . magnesium oxide (MAG-OX) tablet 400 mg  400 mg Oral Daily Rhona Raider Stinson, DO   400 mg at 06/19/15 0825  . metoprolol (LOPRESSOR) tablet 50 mg  50 mg Oral BID Rhona Raider Stinson, DO   50 mg at 06/19/15 0825  . oxyCODONE-acetaminophen (PERCOCET/ROXICET) 5-325 MG per tablet 0.5-1 tablet  0.5-1 tablet Oral Q4H PRN Rhona Raider Stinson, DO   0.5 tablet at 06/19/15 0044  . pantoprazole (PROTONIX) EC tablet 80 mg  80 mg Oral Daily Rhona Raider Stinson, DO   80 mg at 06/19/15 1000  . polyethylene glycol (MIRALAX / GLYCOLAX) packet 17 g  17 g Oral Daily PRN Rhona Raider Stinson, DO      .  predniSONE (DELTASONE) tablet 7.5 mg  7.5 mg Oral Q breakfast Rhona Raider Stinson, DO   7.5 mg at 06/19/15 0800  . ursodiol (ACTIGALL) capsule 300 mg  300 mg Oral BID Rhona Raider Stinson, DO   300 mg at 06/19/15 1000   Facility-Administered Medications Ordered in Other Encounters  Medication Dose Route Frequency Provider Last Rate Last Dose  . 0.9 %  sodium chloride infusion   Intravenous Continuous Ellouise Newer, PA-C   Stopped at 10/04/13 1230  . diphenhydrAMINE (BENADRYL) capsule 25 mg  25 mg Oral Q6H PRN Glenford Peers, MD   25 mg at 05/13/11 5625     Discharge Medications: Please see discharge summary for a list of discharge medications.  Relevant Imaging Results:  Relevant Lab Results:   Additional Information    Gisselle Galvis, Juleen China, LCSW

## 2015-06-19 NOTE — Evaluation (Signed)
Physical Therapy Evaluation Patient Details Name: Eileen Santana MRN: [id number] DOB: 1930/07/28 Today's Date: 06/19/2015   History of Present Illness  Eileen Santana is an 82yo black female who comes to [facility] after progressive weakness in BLE over last 30 days; admitted for UTI. PMH: GIB, HTN, RA, 2nd degree Heart Block, steroid enduced myopathy, chronic bila tfoot pain. Last PT note from reports R sided L4/L5 disc protrusion and sacral stress fractures. Pt presenting today with continued weakness/lethargy adn moderate LEE bilat, which she reports is a chronic issue. At baseline, pt amb household distances only, with SPC.   Clinical Impression  Pt demonstrating BLE weakness minimally worse on RLE than LLE that now limits the patient's ability to transfer and ambulate at her prior level of independence. The pt now requires max-totalA to perform transfers and gait, whereas one month ago, she could perform these with modified independence. Limited weight bearing tolerance and instability on RLE prevents extensive gait trials this session and repeated transfers result in rapid onset of fatigue (see note below for detail). Pt will benefit from skilled PT services to address the above, to improve pt safety and return to PLOF.     Follow Up Recommendations SNF    Equipment Recommendations  None recommended by PT    Recommendations for Other Services       Precautions / Restrictions Precautions Precautions: None Precaution Comments: no falls score available.       Mobility  Bed Mobility Overal bed mobility: Modified Independent                Transfers Overall transfer level: Needs assistance Equipment used: 1 person hand held assist;Straight cane             General transfer comment: first time transfer pt is MinA, 2nd time is maxA, and third time pt is total assist unable to come to full stance.   Ambulation/Gait Ambulation/Gait assistance: Max assist Ambulation Distance  (Feet):  (bed to chair only. )         General Gait Details: SPC in RUe, + single HHA on Left side. very nsteady, especially when attempting RLE weight bearing.   Stairs            Wheelchair Mobility    Modified Rankin (Stroke Patients Only)       Balance Overall balance assessment: Needs assistance   Sitting balance-Leahy Scale: Good       Standing balance-Leahy Scale: Poor                               Pertinent Vitals/Pain Pain Assessment: No/denies pain    Home Living Family/patient expects to be discharged to:: Private residence Living Arrangements: Children (daughter) Available Help at Discharge: Family;Available PRN/intermittently Type of Home: House Home Access: Stairs to enter Entrance Stairs-Rails: Left Entrance Stairs-Number of Steps: 3 Home Layout: Two level;Able to live on main level with bedroom/bathroom Home Equipment: Dan Humphreys - 2 wheels;Cane - quad;Cane - single point;Bedside commode;Grab bars - tub/shower      Prior Function Level of Independence: Independent with assistive device(s)         Comments: per pt report she uses a cane, but has a RW; Pt is independent in ADL, and gets help with IADl from daugthers.      Hand Dominance   Dominant Hand: Right    Extremity/Trunk Assessment   Upper Extremity Assessment: Overall WFL for tasks assessed (grossly 5/5. symetrical)  Lower Extremity Assessment: Generalized weakness (RLE is 5/5 grossly, with consistent but minimal weakness throughout., mostly limiting end range mobility ( knee extension, ankle DF, and hip flexion)  R sided weakness is more noticable during transfers, with 50% less WB on RLE. )         Communication   Communication:  (delayed speech, somewhat distracted. )  Cognition Arousal/Alertness:  (somewhat drowsy, delayed response.) Behavior During Therapy: WFL for tasks assessed/performed Overall Cognitive Status: No family/caregiver present to  determine baseline cognitive functioning       Memory:  (oriented x4)              General Comments      Exercises        Assessment/Plan    PT Assessment Patient needs continued PT services  PT Diagnosis Difficulty walking;Generalized weakness   PT Problem List Decreased strength;Decreased range of motion;Decreased activity tolerance;Decreased balance;Decreased mobility;Decreased coordination  PT Treatment Interventions Gait training;Stair training;Functional mobility training;Therapeutic activities;Therapeutic exercise;Balance training;Neuromuscular re-education;Patient/family education   PT Goals (Current goals can be found in the Care Plan section) Acute Rehab PT Goals Patient Stated Goal: imrpove leg strength.  PT Goal Formulation: With patient Time For Goal Achievement: 07/03/15 Potential to Achieve Goals: Fair    Frequency Min 3X/week   Barriers to discharge Inaccessible home environment      Co-evaluation               End of Session Equipment Utilized During Treatment: Gait belt Activity Tolerance: Patient tolerated treatment well;Patient limited by fatigue;Patient limited by lethargy Patient left: in chair;with call bell/phone within reach;with family/visitor present Nurse Communication: Mobility status;Other (comment)    Functional Limitation: Mobility: Walking and moving around Mobility: Walking and Moving Around Current Status [phone]): At least 80 percent but less than 100 percent impaired, limited or restricted Mobility: Walking and Moving Around Goal Status [phone]): At least 80 percent but less than 100 percent impaired, limited or restricted    Time: 1551-1611 PT Time Calculation (min) (ACUTE ONLY): 20 min   Charges:   PT Evaluation $PT Eval High Complexity: 1 Procedure PT Treatments $Therapeutic Activity: 8-22 mins   PT G Codes:   PT G-Codes **NOT FOR INPATIENT CLASS** Functional Limitation: Mobility: Walking and moving  around Mobility: Walking and Moving Around Current Status (Y0459): At least 80 percent but less than 100 percent impaired, limited or restricted Mobility: Walking and Moving Around Goal Status [phone]): At least 80 percent but less than 100 percent impaired, limited or restricted    4:37 PM, 06/19/2015 Rosamaria Lints, PT, DPT PRN Physical Therapist - [facility] License # [id number] [phone] (mobile)

## 2015-06-19 NOTE — Care Management Obs Status (Signed)
MEDICARE OBSERVATION STATUS NOTIFICATION   Patient Details  Name: Eileen Santana MRN: [id number] Date of Birth: 1930-06-07   Medicare Observation Status Notification Given:  Yes    Malcolm Metro, RN 06/19/2015, 1:44 PM

## 2015-06-19 NOTE — Clinical Social Work Note (Signed)
Clinical Social Work Assessment  Patient Details  Name: Eileen Santana MRN: [id number] Date of Birth: 11/08/1930  Date of referral:  06/19/15               Reason for consult:  Facility Placement                Permission sought to share information with:  Family Supports Permission granted to share information::     Name::        Agency::     Relationship::     Contact Information:     Housing/Transportation Living arrangements for the past 2 months:  Single Family Home Source of Information:  Patient Patient Interpreter Needed:  None Criminal Activity/Legal Involvement Pertinent to Current Situation/Hospitalization:  No - Comment as needed Significant Relationships:  Adult Children Lives with:  Adult Children Do you feel safe going back to the place where you live?  Yes Need for family participation in patient care:  Yes (Comment)  Care giving concerns: Patient did not identify care giving concers.   Social Worker assessment / plan:  CSW met with patient who advised that she live with two of her daughters, [place] and [place].  She stated that a baseline she uses a walker, cane and wheelchair. Patient advised that she uses a walker most often.  She indicated that her ADLs are assisted by her daughters.  CSW discussed the PT recommendation and provided the SNF list. She stated that she would speak with her children this even when they visited.  She did mention that if she decides to go to a SNF she would consider [facility].   Employment status:  Retired Forensic scientist:  Medicare PT Recommendations:  [place] / Referral to community resources:  [place]  Patient/Family's Response to care:  Patient is considering SNF and will talk with her children tonight regarding the decision.   Patient/Family's Understanding of and Emotional Response to Diagnosis, Current Treatment, and Prognosis:  Patient appears to understand her diagnosis, treatment  and prognosis.   Emotional Assessment Appearance:  Appears stated age Attitude/Demeanor/Rapport:   (Cooperative) Affect (typically observed):  Accepting Orientation:  Oriented to Self, Oriented to  Time, Oriented to Situation, Oriented to Place Alcohol / Substance use:  Not Applicable Psych involvement (Current and /or in the community):  No (Comment)  Discharge Needs  Concerns to be addressed:  Discharge Planning Concerns Readmission within the last 30 days:  No Current discharge risk:  None Barriers to Discharge:  No Barriers Identified   Ihor Gully, LCSW 06/19/2015, 4:58 PM

## 2015-06-19 NOTE — Care Management Note (Signed)
Case Management Note  Patient Details  Name: Eileen Santana MRN: [id number] Date of Birth: 11-Sep-1930  Subjective/Objective:                  Pt is from home, lives with her daughter and is ind with ADL's. Pt's daughter provides transports to appointments. Pt states her HH services through [facility] was stopped about 1-2 weeks ago and pt was doing well. Pt reports falling at MD appointments and doesn't know why. Pt reports using a cane with mobility. Pt does not feel she needs further HH services at [place]. Pt provided list of PD agencies at her request.   Action/Plan: No CM needs anticipated.   Expected Discharge Date:    06/19/2015              Expected Discharge Plan:  Home/Self Care  In-House Referral:  NA  Discharge planning Services  CM Consult  Post Acute Care Choice:  NA Choice offered to:  NA  DME Arranged:    DME Agency:     HH Arranged:    HH Agency:     Status of Service:  Completed, signed off  Medicare Important Message Given:    Date Medicare IM Given:    Medicare IM give by:    Date Additional Medicare IM Given:    Additional Medicare Important Message give by:     If discussed at Long Length of Stay Meetings, dates discussed:    Additional Comments:  Malcolm Metro, RN 06/19/2015, 1:44 PM

## 2015-06-19 NOTE — Progress Notes (Signed)
OT Cancellation Note  Patient Details Name: Tyannah Sane MRN: [id number] DOB: 1930/09/20   Cancelled Treatment:     Reason evaluation not completed: Chart reviewed, evaluation attempted. Pt unable to keep eyes open during conversation, reporting her daughter just gave her a bath. Pt requested OT return in the afternoon when she was not as tired. Will attempt evaluation at a later time.  Ezra Sites, OTR/L  [phone]  06/19/2015, 10:15 AM

## 2015-06-20 ENCOUNTER — Inpatient Hospital Stay (HOSPITAL_COMMUNITY): Payer: Medicare Other

## 2015-06-20 LAB — COMPREHENSIVE METABOLIC PANEL
ALK PHOS: 64 U/L (ref 38–126)
ALT: 22 U/L (ref 14–54)
AST: 227 U/L — ABNORMAL HIGH (ref 15–41)
Albumin: 2.4 g/dL — ABNORMAL LOW (ref 3.5–5.0)
Anion gap: 7 (ref 5–15)
BUN: 19 mg/dL (ref 6–20)
CALCIUM: 9.5 mg/dL (ref 8.9–10.3)
CO2: 24 mmol/L (ref 22–32)
CREATININE: 1.12 mg/dL — AB (ref 0.44–1.00)
Chloride: 105 mmol/L (ref 101–111)
GFR, EST AFRICAN AMERICAN: 52 mL/min — AB (ref >60)
GFR, EST NON AFRICAN AMERICAN: 44 mL/min — AB (ref >60)
Glucose, Bld: 92 mg/dL (ref 65–99)
Potassium: 4.5 mmol/L (ref 3.5–5.1)
Sodium: 136 mmol/L (ref 135–145)
Total Bilirubin: 1.9 mg/dL — ABNORMAL HIGH (ref 0.3–1.2)
Total Protein: 5.4 g/dL — ABNORMAL LOW (ref 6.5–8.1)

## 2015-06-20 LAB — CBC
HCT: 37.2 % (ref 36.0–46.0)
HEMOGLOBIN: 12.4 g/dL (ref 12.0–15.0)
MCH: 28.3 pg (ref 26.0–34.0)
MCHC: 33.3 g/dL (ref 30.0–36.0)
MCV: 84.9 fL (ref 78.0–100.0)
Platelets: 38 10*3/uL — ABNORMAL LOW (ref 150–400)
RBC: 4.38 MIL/uL (ref 3.87–5.11)
RDW: 23.8 % — ABNORMAL HIGH (ref 11.5–15.5)
WBC: 4.8 10*3/uL (ref 4.0–10.5)

## 2015-06-20 LAB — URINE CULTURE: Culture: NO GROWTH

## 2015-06-20 LAB — BRAIN NATRIURETIC PEPTIDE: B NATRIURETIC PEPTIDE 5: 552 pg/mL — AB (ref 0.0–100.0)

## 2015-06-20 MED ORDER — GUAIFENESIN-DM 100-10 MG/5ML PO SYRP
5.0000 mL | ORAL_SOLUTION | ORAL | Status: DC | PRN
  Administered 2015-06-20 – 2015-06-22 (×4): 5 mL via ORAL
  Filled 2015-06-20 (×4): qty 5

## 2015-06-21 DIAGNOSIS — G8929 Other chronic pain: Secondary | ICD-10-CM

## 2015-06-21 DIAGNOSIS — R05 Cough: Secondary | ICD-10-CM

## 2015-06-21 DIAGNOSIS — N390 Urinary tract infection, site not specified: Principal | ICD-10-CM

## 2015-06-21 DIAGNOSIS — R059 Cough, unspecified: Secondary | ICD-10-CM | POA: Insufficient documentation

## 2015-06-21 DIAGNOSIS — M549 Dorsalgia, unspecified: Secondary | ICD-10-CM

## 2015-06-21 LAB — BASIC METABOLIC PANEL
ANION GAP: 4 — AB (ref 5–15)
BUN: 20 mg/dL (ref 6–20)
CALCIUM: 9 mg/dL (ref 8.9–10.3)
CO2: 24 mmol/L (ref 22–32)
Chloride: 105 mmol/L (ref 101–111)
Creatinine, Ser: 0.96 mg/dL (ref 0.44–1.00)
GFR, EST NON AFRICAN AMERICAN: 54 mL/min — AB (ref >60)
GLUCOSE: 99 mg/dL (ref 65–99)
POTASSIUM: 3.9 mmol/L (ref 3.5–5.1)
Sodium: 133 mmol/L — ABNORMAL LOW (ref 135–145)

## 2015-06-21 NOTE — Progress Notes (Signed)
[facility] PROGRESS NOTE  [name] WIO:[id number] DOB: [birth year] DOA: 06/18/2015   PCP: [name], MD     Assessment/Plan: Principal Problem:   UTI (urinary tract infection) Active Problems:   Weakness of both lower extremities   Lactic acidosis   Elevated troponin I level   Acute renal failure superimposed on stage 3 chronic kidney disease (HCC)    Eileen Santana is a 82 y.o. female with a history of GI bleeds, essential hypertension, DJD, second degree heart block, rheumatoid arthritis, PAF (CHADS 2 VASC score of 4) not on anticoagulation secondary to GI bleeds, stage III chronic kidney disease, steroid-induced myopathy, feet pain, ambulation with walker secondary to feet pain. Patient seen for increasing weakness in lower extremities for the past 24 hours. This was unable to stand and walk this morning and states that her legs just gave out.  2 days ago was started on ciprofloxacin for UTI and was also started on Neurontin for chronic leg pain. Denies fevers, chills, nausea, vomiting, diarrhea, abdominal pain. Family concerned about confusion. Will stop neurontin and xanax.   Assessment and plan Acute kidney injury-improved with IV hydration Creatinine 1.58---> <1.0  Urinary tract infection- last day Rocephin,  urine culture results no growth  Bilateral lower extremity weakness/chronic back pain: Likely exacerbated by UTI and acute kidney injury  MRI of the lumbar spine 03/15/15 shows a stress sacral fracture at S3. Large extraforaminal L4-L5 disc protrusion She now focal deficit on neurological exam. MRI results were discussed by Dr. Robb Matar with neurosurgery who recommended no surgical intervention at this time. Patient was discharged to SNF Patient recently started on Neurontin at bedtime, stopping PT OT evaluation --> pt needs ip rehab stay  Essential hypertension: No changes were made to her regimen.  Chronic thrombocytopenia: She will need to follow-up  with her PCP as an outpatient.  Anemia of chronic disease Seems to be at baseline.  Atrial flutter (HCC) Rate control continue aspirin.  COPD (chronic obstructive pulmonary disease) (HCC): No evidence of exacerbation. Continue bronchodilators as needed.   Chronic renal disease, stage 3, moderately decreased glomerular filtration rate between 30-59 mL/min/1.73 square meter Appears to be at baseline.  Chronically elevated AST -unchanged from baseline  History of rheumatoid arthritis-patient chronically on prednisone, imuran   DVT prophylaxsis  SCDs  due to low platelets   Code Status:  Full code    Family Communication: Discussed in detail with the patient/daughter, all imaging results, lab results explained to the patient   Disposition Plan: Anticipate discharge Monday      Consultants:  None   Procedures:  None   Antibiotics: Anti-infectives    Start     Dose/Rate Route Frequency Ordered Stop   06/19/15 2000  cefTRIAXone (ROCEPHIN) 1 g in dextrose 5 % 50 mL IVPB     1 g 100 mL/hr over 30 Minutes Intravenous Every 24 hours 06/18/15 0743         HPI/Subjective: Feels better today, no chest pain or shortness of breath Coughing, almost gone. Pt has been ambulating.  Objective: Filed Vitals:   06/20/15 1654 06/20/15 2048 06/21/15 0607 06/21/15 1300  BP: 102/65 135/74 123/80 107/64  Pulse: 106 111 107 87  Temp: 98.8 F (37.1 C) 98.4 F (36.9 C) 98.4 F (36.9 C) 97.8 F (36.6 C)  TempSrc:  Oral Oral Oral  Resp: 16 20 20 20   Height:      Weight:      SpO2: 93% 94% 93% 100%  Intake/Output Summary (Last 24 hours) at 06/21/15 2052 Last data filed at 06/21/15 1800  Gross per 24 hour  Intake    360 ml  Output   1200 ml  Net   -840 ml    Exam:  Examination:  General exam: Appears calm and comfortable; NAD Respiratory system: Clear to auscultation. Respiratory effort normal. Cardiovascular system: S1 & S2 heard, RRR. No JVD, murmurs, rubs,  gallops or clicks. No pedal edema. Gastrointestinal system: Abdomen is nondistended, soft and nontender.  No organomegaly or masses felt. Normal bowel sounds heard. Central nervous system: Alert and oriented. No focal neurological deficits. Extremities: Symmetric 5 x 5 power. Skin: No rashes, lesions or ulcers Psychiatry: Judgement and insight appear normal. Mood & affect appropriate.     Data Reviewed: I have personally reviewed following labs and imaging studies  Micro Results Recent Results (from the past 240 hour(s))  Urine culture     Status: None   Collection Time: 06/18/15  6:45 PM  Result Value Ref Range Status   Specimen Description URINE, CLEAN CATCH  Final   Special Requests NONE  Final   Culture NO GROWTH Performed at [facility]   Final   Report Status 06/20/2015 FINAL  Final    Radiology Reports Dg Chest 2 View  06/18/2015  CLINICAL DATA:  Acute onset of bilateral lower extremity weakness and swelling. Initial encounter. EXAM: CHEST  2 VIEW COMPARISON:  Chest radiograph performed 03/15/2015 FINDINGS: The lungs are well-aerated. Pulmonary vascularity is at the upper limits of normal. There is no evidence of focal opacification, pleural effusion or pneumothorax. The heart is enlarged.  No acute osseous abnormalities are seen. IMPRESSION: Cardiomegaly.  Lungs remain grossly clear. Electronically Signed   By: Roanna Raider M.D.   On: 06/18/2015 19:22   Dg Lumbar Spine 2-3 Views  06/19/2015  CLINICAL DATA:  Low back pain EXAM: LUMBAR SPINE - 2-3 VIEW COMPARISON:  MRI 03/15/2015 and x-ray 1/28/ 17 FINDINGS: Three views of the lumbar spine submitted. No acute fracture or subluxation. Again noted significant disc space flattening with vacuum disc phenomenon at L4-L5 and L5-S1 level. Facet degenerative changes L5 level. Atherosclerotic calcifications of abdominal aorta and iliac arteries are noted. Mild lower thoracic levoscoliosis. IMPRESSION: No acute fracture or  subluxation. Again noted osteoarthritic changes at L4-L5 and L5-S1 level. Electronically Signed   By: Natasha Mead M.D.   On: 06/19/2015 12:13   Dg Pelvis 1-2 Views  06/19/2015  CLINICAL DATA:  Low back pain, leg pain EXAM: PELVIS - 1-2 VIEW COMPARISON:  CT scan 03/31/2014 FINDINGS: Single frontal view of the pelvis submitted. No acute fracture or subluxation. Degenerative changes left SI joint again noted. Degenerative changes pubic symphysis. Mild degenerative changes bilateral hip joints with superior acetabular spurring. IMPRESSION: No acute fracture or subluxation. Osteoarthritic changes as described above. Electronically Signed   By: Natasha Mead M.D.   On: 06/19/2015 12:19   Dg Sacrum/coccyx  06/19/2015  CLINICAL DATA:  Low back pain leg pain, no known injury EXAM: SACRUM AND COCCYX - 2+ VIEW COMPARISON:  03/31/2014 FINDINGS: Three views of the sacrum and coccyx submitted. No acute fracture or subluxation. There is diffuse osteopenia. Significant osteoarthritic changes left SI joint. Degenerative changes pubic symphysis. There is a healed stress fracture in S3 level sacrum best seen on lateral view with alignment preserved. IMPRESSION: No acute fracture or subluxation. There is diffuse osteopenia. Significant osteoarthritic changes left SI joint. Degenerative changes pubic symphysis. There is a healed stress fracture  in S3 level sacrum best seen on lateral view with alignment preserved. Electronically Signed   By: Natasha Mead M.D.   On: 06/19/2015 12:18   Ct Head Wo Contrast  06/18/2015  CLINICAL DATA:  Bilateral leg weakness. EXAM: CT HEAD WITHOUT CONTRAST TECHNIQUE: Contiguous axial images were obtained from the base of the skull through the vertex without intravenous contrast. COMPARISON:  03/13/2015 FINDINGS: Brain: No evidence of acute infarction, hemorrhage, extra-axial collection, ventriculomegaly, or mass effect. There is moderate brain parenchymal atrophy and mild bilateral deep white matter  microvascular ischemia. Vascular: Vascular calcifications at the skullbase. Skull: Negative for fracture or focal lesion. Sinuses/Orbits: No acute findings. Other: None. IMPRESSION: No acute intracranial abnormality. Atrophy, chronic microvascular disease. Electronically Signed   By: Ted Mcalpine M.D.   On: 06/18/2015 19:43   Dg Chest Port 1 View  06/20/2015  CLINICAL DATA:  Cough EXAM: PORTABLE CHEST 1 VIEW COMPARISON:  06/18/2015 chest radiograph. FINDINGS: Stable cardiomediastinal silhouette with moderate cardiomegaly. No pneumothorax. Probable trace bilateral pleural effusions. Cephalization of the pulmonary vasculature without overt pulmonary edema. No acute consolidative airspace disease. IMPRESSION: Stable moderate cardiomegaly. Cephalization of the pulmonary vasculature without overt pulmonary edema. No acute consolidative airspace disease. Electronically Signed   By: Delbert Phenix M.D.   On: 06/20/2015 16:37     CBC  Recent Labs Lab 06/18/15 1820 06/19/15 0347 06/20/15 0710  WBC 4.5 3.7* 4.8  HGB 12.1 10.6* 12.4  HCT 35.8* 31.4* 37.2  PLT 61* 48* 38*  MCV 84.2 84.2 84.9  MCH 28.5 28.4 28.3  MCHC 33.8 33.8 33.3  RDW 22.9* 22.8* 23.8*  LYMPHSABS 0.2*  --   --   MONOABS 0.4  --   --   EOSABS 0.0  --   --   BASOSABS 0.0  --   --     Chemistries   Recent Labs Lab 06/18/15 1820 06/19/15 0347 06/20/15 0710 06/21/15 1135  NA 132* 135 136 133*  K 4.8 4.1 4.5 3.9  CL 95* 103 105 105  CO2 GLUCOSE 152* 126* 92 99  BUN 25* 21* 19 20  CREATININE 1.58* 1.11* 1.12* 0.96  CALCIUM 10.9* 9.2 9.5 9.0  AST 256* 202* 227*  --   ALT --   ALKPHOS 85 63 64  --   BILITOT 2.0* 1.5* 1.9*  --    ------------------------------------------------------------------------------------------------------------------ estimated creatinine clearance is 44.2 mL/min (by C-G formula based on Cr of  0.96). ------------------------------------------------------------------------------------------------------------------ No results for input(s): HGBA1C in the last 72 hours. ------------------------------------------------------------------------------------------------------------------ No results for input(s): CHOL, HDL, LDLCALC, TRIG, CHOLHDL, LDLDIRECT in the last 72 hours. ------------------------------------------------------------------------------------------------------------------  Recent Labs  06/19/15 0347  TSH 0.977   ------------------------------------------------------------------------------------------------------------------  Recent Labs  06/19/15 0347  VITAMINB12 1336*  FOLATE 32.2    Coagulation profile No results for input(s): INR, PROTIME in the last 168 hours.  No results for input(s): DDIMER in the last 72 hours.  Cardiac Enzymes  Recent Labs Lab 06/18/15 1820 06/18/15 2129  TROPONINI 0.05* 0.03   ------------------------------------------------------------------------------------------------------------------ Invalid input(s): POCBNP   CBG:  Recent Labs Lab 06/18/15 1739  GLUCAP 168*       Studies: Dg Chest Port 1 View  06/20/2015  CLINICAL DATA:  Cough EXAM: PORTABLE CHEST 1 VIEW COMPARISON:  06/18/2015 chest radiograph. FINDINGS: Stable cardiomediastinal silhouette with moderate cardiomegaly. No pneumothorax. Probable trace bilateral pleural effusions. Cephalization of the pulmonary vasculature without overt pulmonary edema. No acute consolidative airspace disease. IMPRESSION: Stable moderate  cardiomegaly. Cephalization of the pulmonary vasculature without overt pulmonary edema. No acute consolidative airspace disease. Electronically Signed   By: Delbert Phenix M.D.   On: 06/20/2015 16:37      Lab Results  Component Value Date   HGBA1C 6.5* 03/05/2011   Lab Results  Component Value Date   MICROALBUR 0.70 03/05/2011   LDLCALC  120* 03/06/2011   CREATININE 0.96 06/21/2015       Scheduled Meds: . aspirin  162 mg Oral Daily  . azaTHIOprine  75 mg Oral Daily  . cefTRIAXone (ROCEPHIN)  IV  1 g Intravenous Q24H  . diltiazem (CARDIZEM CD) 24 hr capsule 360 mg  360 mg Oral Daily  . docusate sodium  100 mg Oral BID  . gabapentin  100 mg Oral QHS  . latanoprost  1 drop Both Eyes QHS  . loratadine  10 mg Oral Daily  . magnesium oxide  400 mg Oral Daily  . metoprolol  50 mg Oral BID  . pantoprazole  80 mg Oral Daily  . predniSONE  7.5 mg Oral Q breakfast  . ursodiol  300 mg Oral BID   Continuous Infusions:     LOS: 2 days    Time spent: >30 MINS    Haydee Salter  [facility] Pager [phone]. If 7PM-7AM, please contact night-coverage at [email or web address], password [facility] 06/21/2015, 8:52 PM  LOS: 2 days

## 2015-06-21 NOTE — Progress Notes (Signed)
[facility] PROGRESS NOTE  Scott Fix HWT:[id number] DOB: 30-Mar-1930 DOA: 06/18/2015   PCP: [name], MD     Assessment/Plan: Principal Problem:   UTI (urinary tract infection) Active Problems:   Weakness of both lower extremities   Lactic acidosis   Elevated troponin I level   Acute renal failure superimposed on stage 3 chronic kidney disease (HCC)    Eileen Santana is a 82 y.o. female with a history of GI bleeds, essential hypertension, DJD, second degree heart block, rheumatoid arthritis, PAF (CHADS 2 VASC score of 4) not on anticoagulation secondary to GI bleeds, stage III chronic kidney disease, steroid-induced myopathy, feet pain, ambulation with walker secondary to feet pain. Patient seen for increasing weakness in lower extremities for the past 24 hours. This was unable to stand and walk this morning and states that her legs just gave out.  2 days ago was started on ciprofloxacin for UTI and was also started on Neurontin for chronic leg pain. Denies fevers, chills, nausea, vomiting, diarrhea, abdominal pain.   Assessment and plan Acute kidney injury-improved with IV hydration Creatinine 1.58> 1.1  Urinary tract infection-continue Rocephin, follow urine culture results  Bilateral lower extremity weakness/chronic back pain: Likely exacerbated by UTI and acute kidney injury  MRI of the lumbar spine 03/15/15 shows a stress sacral fracture at S3. Large extraforaminal L4-L5 disc protrusion She now focal deficit on neurological exam. MRI results were discussed by Dr. Robb Matar with neurosurgery who recommended no surgical intervention at this time. Patient was discharged to SNF Patient recently started on Neurontin at bedtime, extremely low-dose therefore likely not contributing to her symptoms Will repeat plain x-rays of the sacral and the lumbar spine and pelvis to rule out any new stress fracture PT OT evaluation  Essential hypertension: No changes were made  to her regimen.  Chronic thrombocytopenia: She will need to follow-up with her PCP as an outpatient.  Anemia of chronic disease Seems to be at baseline.  Atrial flutter (HCC) Rate control continue aspirin.  COPD (chronic obstructive pulmonary disease) (HCC): No evidence of exacerbation. Continue bronchodilators as needed.   Chronic renal disease, stage 3, moderately decreased glomerular filtration rate between 30-59 mL/min/1.73 square meter Appears to be at baseline.  Chronically elevated AST -unchanged from baseline  History of rheumatoid arthritis-patient chronically on prednisone, imuran   DVT prophylaxsis  SCDs  due to low platelets   Code Status:  Full code    Family Communication: Discussed in detail with the patient/daughter, all imaging results, lab results explained to the patient   Disposition Plan: Anticipate discharge Monday      Consultants:  None   Procedures:  None   Antibiotics: Anti-infectives    Start     Dose/Rate Route Frequency Ordered Stop   06/19/15 2000  cefTRIAXone (ROCEPHIN) 1 g in dextrose 5 % 50 mL IVPB     1 g 100 mL/hr over 30 Minutes Intravenous Every 24 hours 06/18/15 0743         HPI/Subjective: Feels better today, afebrile, no chest pain or shortness of breath Coughing  Objective: Filed Vitals:   06/20/15 1647 06/20/15 1654 06/20/15 2048 06/21/15 0607  BP:  102/65 135/74 123/80  Pulse:  106 111 107  Temp:  98.8 F (37.1 C) 98.4 F (36.9 C) 98.4 F (36.9 C)  TempSrc:   Oral Oral  Resp:  16 20 20   Height:      Weight:      SpO2: 91%  93% 94% 93%    Intake/Output Summary (Last 24 hours) at 06/21/15 1102 Last data filed at 06/21/15 0608  Gross per 24 hour  Intake    120 ml  Output    200 ml  Net    -80 ml    Exam:  Examination:  General exam: Appears calm and comfortable  Respiratory system: Clear to auscultation. Respiratory effort normal. Cardiovascular system: S1 & S2 heard, RRR. No JVD, murmurs,  rubs, gallops or clicks. No pedal edema. Gastrointestinal system: Abdomen is nondistended, soft and nontender.  No organomegaly or masses felt. Normal bowel sounds heard. Central nervous system: Alert and oriented. No focal neurological deficits. Extremities: Symmetric 5 x 5 power. Skin: No rashes, lesions or ulcers Psychiatry: Judgement and insight appear normal. Mood & affect appropriate.     Data Reviewed: I have personally reviewed following labs and imaging studies  Micro Results Recent Results (from the past 240 hour(s))  Urine culture     Status: None   Collection Time: 06/18/15  6:45 PM  Result Value Ref Range Status   Specimen Description URINE, CLEAN CATCH  Final   Special Requests NONE  Final   Culture NO GROWTH Performed at [facility]   Final   Report Status 06/20/2015 FINAL  Final    Radiology Reports Dg Chest 2 View  06/18/2015  CLINICAL DATA:  Acute onset of bilateral lower extremity weakness and swelling. Initial encounter. EXAM: CHEST  2 VIEW COMPARISON:  Chest radiograph performed 03/15/2015 FINDINGS: The lungs are well-aerated. Pulmonary vascularity is at the upper limits of normal. There is no evidence of focal opacification, pleural effusion or pneumothorax. The heart is enlarged.  No acute osseous abnormalities are seen. IMPRESSION: Cardiomegaly.  Lungs remain grossly clear. Electronically Signed   By: Roanna Raider M.D.   On: 06/18/2015 19:22   Dg Lumbar Spine 2-3 Views  06/19/2015  CLINICAL DATA:  Low back pain EXAM: LUMBAR SPINE - 2-3 VIEW COMPARISON:  MRI 03/15/2015 and x-ray 1/28/ 17 FINDINGS: Three views of the lumbar spine submitted. No acute fracture or subluxation. Again noted significant disc space flattening with vacuum disc phenomenon at L4-L5 and L5-S1 level. Facet degenerative changes L5 level. Atherosclerotic calcifications of abdominal aorta and iliac arteries are noted. Mild lower thoracic levoscoliosis. IMPRESSION: No acute fracture or  subluxation. Again noted osteoarthritic changes at L4-L5 and L5-S1 level. Electronically Signed   By: Natasha Mead M.D.   On: 06/19/2015 12:13   Dg Pelvis 1-2 Views  06/19/2015  CLINICAL DATA:  Low back pain, leg pain EXAM: PELVIS - 1-2 VIEW COMPARISON:  CT scan 03/31/2014 FINDINGS: Single frontal view of the pelvis submitted. No acute fracture or subluxation. Degenerative changes left SI joint again noted. Degenerative changes pubic symphysis. Mild degenerative changes bilateral hip joints with superior acetabular spurring. IMPRESSION: No acute fracture or subluxation. Osteoarthritic changes as described above. Electronically Signed   By: Natasha Mead M.D.   On: 06/19/2015 12:19   Dg Sacrum/coccyx  06/19/2015  CLINICAL DATA:  Low back pain leg pain, no known injury EXAM: SACRUM AND COCCYX - 2+ VIEW COMPARISON:  03/31/2014 FINDINGS: Three views of the sacrum and coccyx submitted. No acute fracture or subluxation. There is diffuse osteopenia. Significant osteoarthritic changes left SI joint. Degenerative changes pubic symphysis. There is a healed stress fracture in S3 level sacrum best seen on lateral view with alignment preserved. IMPRESSION: No acute fracture or subluxation. There is diffuse osteopenia. Significant osteoarthritic changes left SI joint. Degenerative changes  pubic symphysis. There is a healed stress fracture in S3 level sacrum best seen on lateral view with alignment preserved. Electronically Signed   By: Natasha Mead M.D.   On: 06/19/2015 12:18   Ct Head Wo Contrast  06/18/2015  CLINICAL DATA:  Bilateral leg weakness. EXAM: CT HEAD WITHOUT CONTRAST TECHNIQUE: Contiguous axial images were obtained from the base of the skull through the vertex without intravenous contrast. COMPARISON:  03/13/2015 FINDINGS: Brain: No evidence of acute infarction, hemorrhage, extra-axial collection, ventriculomegaly, or mass effect. There is moderate brain parenchymal atrophy and mild bilateral deep white matter  microvascular ischemia. Vascular: Vascular calcifications at the skullbase. Skull: Negative for fracture or focal lesion. Sinuses/Orbits: No acute findings. Other: None. IMPRESSION: No acute intracranial abnormality. Atrophy, chronic microvascular disease. Electronically Signed   By: Ted Mcalpine M.D.   On: 06/18/2015 19:43   Dg Chest Port 1 View  06/20/2015  CLINICAL DATA:  Cough EXAM: PORTABLE CHEST 1 VIEW COMPARISON:  06/18/2015 chest radiograph. FINDINGS: Stable cardiomediastinal silhouette with moderate cardiomegaly. No pneumothorax. Probable trace bilateral pleural effusions. Cephalization of the pulmonary vasculature without overt pulmonary edema. No acute consolidative airspace disease. IMPRESSION: Stable moderate cardiomegaly. Cephalization of the pulmonary vasculature without overt pulmonary edema. No acute consolidative airspace disease. Electronically Signed   By: Delbert Phenix M.D.   On: 06/20/2015 16:37     CBC  Recent Labs Lab 06/18/15 1820 06/19/15 0347 06/20/15 0710  WBC 4.5 3.7* 4.8  HGB 12.1 10.6* 12.4  HCT 35.8* 31.4* 37.2  PLT 61* 48* 38*  MCV 84.2 84.2 84.9  MCH 28.5 28.4 28.3  MCHC 33.8 33.8 33.3  RDW 22.9* 22.8* 23.8*  LYMPHSABS 0.2*  --   --   MONOABS 0.4  --   --   EOSABS 0.0  --   --   BASOSABS 0.0  --   --     Chemistries   Recent Labs Lab 06/18/15 1820 06/19/15 0347 06/20/15 0710  NA 132* 135 136  K 4.8 4.1 4.5  CL 95* 103 105  CO2 29 25 24   GLUCOSE 152* 126* 92  BUN 25* 21* 19  CREATININE 1.58* 1.11* 1.12*  CALCIUM 10.9* 9.2 9.5  AST 256* 202* 227*  ALT 26 20 22   ALKPHOS 85 63 64  BILITOT 2.0* 1.5* 1.9*   ------------------------------------------------------------------------------------------------------------------ estimated creatinine clearance is 37.9 mL/min (by C-G formula based on Cr of 1.12). ------------------------------------------------------------------------------------------------------------------ No results for  input(s): HGBA1C in the last 72 hours. ------------------------------------------------------------------------------------------------------------------ No results for input(s): CHOL, HDL, LDLCALC, TRIG, CHOLHDL, LDLDIRECT in the last 72 hours. ------------------------------------------------------------------------------------------------------------------  Recent Labs  06/19/15 0347  TSH 0.977   ------------------------------------------------------------------------------------------------------------------  Recent Labs  06/19/15 0347  VITAMINB12 1336*  FOLATE 32.2    Coagulation profile No results for input(s): INR, PROTIME in the last 168 hours.  No results for input(s): DDIMER in the last 72 hours.  Cardiac Enzymes  Recent Labs Lab 06/18/15 1820 06/18/15 2129  TROPONINI 0.05* 0.03   ------------------------------------------------------------------------------------------------------------------ Invalid input(s): POCBNP   CBG:  Recent Labs Lab 06/18/15 1739  GLUCAP 168*       Studies: Dg Lumbar Spine 2-3 Views  06/19/2015  CLINICAL DATA:  Low back pain EXAM: LUMBAR SPINE - 2-3 VIEW COMPARISON:  MRI 03/15/2015 and x-ray 1/28/ 17 FINDINGS: Three views of the lumbar spine submitted. No acute fracture or subluxation. Again noted significant disc space flattening with vacuum disc phenomenon at L4-L5 and L5-S1 level. Facet degenerative changes L5 level. Atherosclerotic calcifications of abdominal aorta and  iliac arteries are noted. Mild lower thoracic levoscoliosis. IMPRESSION: No acute fracture or subluxation. Again noted osteoarthritic changes at L4-L5 and L5-S1 level. Electronically Signed   By: Natasha Mead M.D.   On: 06/19/2015 12:13   Dg Pelvis 1-2 Views  06/19/2015  CLINICAL DATA:  Low back pain, leg pain EXAM: PELVIS - 1-2 VIEW COMPARISON:  CT scan 03/31/2014 FINDINGS: Single frontal view of the pelvis submitted. No acute fracture or subluxation.  Degenerative changes left SI joint again noted. Degenerative changes pubic symphysis. Mild degenerative changes bilateral hip joints with superior acetabular spurring. IMPRESSION: No acute fracture or subluxation. Osteoarthritic changes as described above. Electronically Signed   By: Natasha Mead M.D.   On: 06/19/2015 12:19   Dg Sacrum/coccyx  06/19/2015  CLINICAL DATA:  Low back pain leg pain, no known injury EXAM: SACRUM AND COCCYX - 2+ VIEW COMPARISON:  03/31/2014 FINDINGS: Three views of the sacrum and coccyx submitted. No acute fracture or subluxation. There is diffuse osteopenia. Significant osteoarthritic changes left SI joint. Degenerative changes pubic symphysis. There is a healed stress fracture in S3 level sacrum best seen on lateral view with alignment preserved. IMPRESSION: No acute fracture or subluxation. There is diffuse osteopenia. Significant osteoarthritic changes left SI joint. Degenerative changes pubic symphysis. There is a healed stress fracture in S3 level sacrum best seen on lateral view with alignment preserved. Electronically Signed   By: Natasha Mead M.D.   On: 06/19/2015 12:18   Dg Chest Port 1 View  06/20/2015  CLINICAL DATA:  Cough EXAM: PORTABLE CHEST 1 VIEW COMPARISON:  06/18/2015 chest radiograph. FINDINGS: Stable cardiomediastinal silhouette with moderate cardiomegaly. No pneumothorax. Probable trace bilateral pleural effusions. Cephalization of the pulmonary vasculature without overt pulmonary edema. No acute consolidative airspace disease. IMPRESSION: Stable moderate cardiomegaly. Cephalization of the pulmonary vasculature without overt pulmonary edema. No acute consolidative airspace disease. Electronically Signed   By: Delbert Phenix M.D.   On: 06/20/2015 16:37      Lab Results  Component Value Date   HGBA1C 6.5* 03/05/2011   Lab Results  Component Value Date   MICROALBUR 0.70 03/05/2011   LDLCALC 120* 03/06/2011   CREATININE 1.12* 06/20/2015       Scheduled  Meds: . aspirin  162 mg Oral Daily  . azaTHIOprine  75 mg Oral Daily  . cefTRIAXone (ROCEPHIN)  IV  1 g Intravenous Q24H  . diltiazem (CARDIZEM CD) 24 hr capsule 360 mg  360 mg Oral Daily  . docusate sodium  100 mg Oral BID  . gabapentin  100 mg Oral QHS  . latanoprost  1 drop Both Eyes QHS  . loratadine  10 mg Oral Daily  . magnesium oxide  400 mg Oral Daily  . metoprolol  50 mg Oral BID  . pantoprazole  80 mg Oral Daily  . predniSONE  7.5 mg Oral Q breakfast  . ursodiol  300 mg Oral BID   Continuous Infusions:     LOS: 2 days    Time spent: >30 MINS    Haydee Salter  [facility] Pager [phone]. If 7PM-7AM, please contact night-coverage at [email or web address], password [facility] 06/21/2015, 11:02 AM  LOS: 2 days

## 2015-06-22 ENCOUNTER — Non-Acute Institutional Stay (SKILLED_NURSING_FACILITY): Payer: Medicare Other | Admitting: Internal Medicine

## 2015-06-22 ENCOUNTER — Inpatient Hospital Stay
Admission: RE | Admit: 2015-06-22 | Discharge: 2015-07-01 | Disposition: A | Discharge status: Other Acute Inpatient Hospital | Payer: Medicare Other | Source: Ambulatory Visit | Attending: Internal Medicine | Admitting: Internal Medicine

## 2015-06-22 ENCOUNTER — Encounter: Payer: Self-pay | Admitting: Internal Medicine

## 2015-06-22 ENCOUNTER — Telehealth (INDEPENDENT_AMBULATORY_CARE_PROVIDER_SITE_OTHER): Payer: Self-pay | Admitting: Internal Medicine

## 2015-06-22 DIAGNOSIS — E872 Acidosis, unspecified: Secondary | ICD-10-CM

## 2015-06-22 DIAGNOSIS — R7989 Other specified abnormal findings of blood chemistry: Secondary | ICD-10-CM

## 2015-06-22 DIAGNOSIS — N183 Chronic kidney disease, stage 3 unspecified: Secondary | ICD-10-CM

## 2015-06-22 DIAGNOSIS — R29898 Other symptoms and signs involving the musculoskeletal system: Secondary | ICD-10-CM | POA: Diagnosis not present

## 2015-06-22 DIAGNOSIS — N179 Acute kidney failure, unspecified: Secondary | ICD-10-CM

## 2015-06-22 DIAGNOSIS — R600 Localized edema: Secondary | ICD-10-CM

## 2015-06-22 DIAGNOSIS — R778 Other specified abnormalities of plasma proteins: Secondary | ICD-10-CM

## 2015-06-22 LAB — CBC
HEMATOCRIT: 33.6 % — AB (ref 36.0–46.0)
Hemoglobin: 11.4 g/dL — ABNORMAL LOW (ref 12.0–15.0)
MCH: 28.4 pg (ref 26.0–34.0)
MCHC: 33.9 g/dL (ref 30.0–36.0)
MCV: 83.8 fL (ref 78.0–100.0)
Platelets: 36 10*3/uL — ABNORMAL LOW (ref 150–400)
RBC: 4.01 MIL/uL (ref 3.87–5.11)
RDW: 23.8 % — ABNORMAL HIGH (ref 11.5–15.5)
WBC: 3.6 10*3/uL — AB (ref 4.0–10.5)

## 2015-06-22 LAB — BASIC METABOLIC PANEL
ANION GAP: 5 (ref 5–15)
BUN: 20 mg/dL (ref 6–20)
CO2: 25 mmol/L (ref 22–32)
Calcium: 9.2 mg/dL (ref 8.9–10.3)
Chloride: 103 mmol/L (ref 101–111)
Creatinine, Ser: 0.98 mg/dL (ref 0.44–1.00)
GFR calc Af Amer: >60 mL/min (ref >60)
GFR, EST NON AFRICAN AMERICAN: 52 mL/min — AB (ref >60)
GLUCOSE: 101 mg/dL — AB (ref 65–99)
POTASSIUM: 4.6 mmol/L (ref 3.5–5.1)
Sodium: 133 mmol/L — ABNORMAL LOW (ref 135–145)

## 2015-06-22 MED ORDER — ALPRAZOLAM 0.25 MG PO TABS: 0.1250 mg | ORAL_TABLET | Freq: Every day | ORAL | Status: DC | PRN

## 2015-06-22 MED ORDER — OXYCODONE-ACETAMINOPHEN 5-325 MG PO TABS: 0.5000 | ORAL_TABLET | ORAL | Status: DC | PRN

## 2015-06-22 NOTE — Clinical Social Work Placement (Signed)
   CLINICAL SOCIAL WORK PLACEMENT  NOTE  Date:  06/22/2015  Patient Details  Name: Eileen Santana MRN: [id number] Date of Birth: 03-Dec-1930  Clinical Social Work is seeking post-discharge placement for this patient at the Skilled  Nursing Facility level of care (*CSW will initial, date and re-position this form in  chart as items are completed):  Yes   Patient/family provided with [facility] Work Department's list of facilities offering this level of care within the geographic area requested by the patient (or if unable, by the patient's family).  Yes   Patient/family informed of their freedom to choose among providers that offer the needed level of care, that participate in Medicare, Medicaid or managed care program needed by the patient, have an available bed and are willing to accept the patient.  Yes   Patient/family informed of [facility]'s ownership interest in [facility] and [facility], as well as of the fact that they are under no obligation to receive care at these facilities.  PASRR submitted to EDS on 06/19/15     PASRR number received on 06/19/15     Existing PASRR number confirmed on       FL2 transmitted to all facilities in geographic area requested by pt/family on 06/22/15     FL2 transmitted to all facilities within larger geographic area on       Patient informed that his/her managed care company has contracts with or will negotiate with certain facilities, including the following:        Yes   Patient/family informed of bed offers received.  Patient chooses bed at [facility]     Physician recommends and patient chooses bed at      Patient to be transferred to [facility] on 06/22/15.  Patient to be transferred to facility by staff     Patient family notified on 06/22/15 of transfer.  Name of family member notified:  [name]- daughter     PHYSICIAN       Additional Comment:     _______________________________________________ Karn Cassis, LCSW 06/22/2015, 3:29 PM [phone]

## 2015-06-22 NOTE — Progress Notes (Signed)
Patient ID: Eileen Santana, female   DOB: April 16, 1930, 82 y.o.   MRN: [id number]    This is a comprehensive admission note to [facility] personally performed by Marga Melnick MD on this date less than 30 days from date of admission. Included are preadmission medical/surgical history;reconciled medication list; family history; social history and comprehensive review of systems.  Corrections and additions to the records were documented . Comprehensive physical exam was also performed. Additionally a clinical summary was entered for each active diagnosis pertinent to this admission in the Problem List to enhance continuity of care.  PCP: Dr. Mirna Mires  HPI: The patient was admitted 06/18/15 with increasing weakness in the lower extremities over the previous 24 hours. Patient was unable to stand or walk the morning of admission as her legs "just gave out". She described pain in the legs from the knees down. There had been no change in diet, medications, or supplements prior the onset of symptoms. No cardiac or neurologic prodrome  To the symptoms was described Her baseline status is ambulation with a rolling walker. On 6/13 the patient had been started on Cipro for dysuria. She states that she had had leg pain remotely in the setting of similar presentation.  Also started was Neurontin for  leg pain.  The weakness in the lower extremities was attributed to acute infection and was associated with lactic acidosis, elevated troponin and acute renal failure superimposed on stage III chronic kidney disease. Only one of 3 troponins was mildly elevated. Lactic acid  level peaked at 3.35. As of 6/16 it was normal with a value of 1.4. She was placed on Ceftriaxone pending results of her urine culture. The final culture results were negative for a urinary  tract infection;but as noted the patient had been on Cipro for 48 hours. Her creatinine rose to 1.58. At the time of discharge it was 0.98.Marland Kitchen Albumin  was as low as 2.2. She had a significant elevation of AST up to 256. Total bilirubin was up to 2.0. She has a history of autoimmune hepatitis. At the request for family folate and B12 levels were drawn which were normal. She exhibited a normochromic, normocytic anemia with hematocrit varying from 31.4 up to 37.2.  Past medical and surgical history: Includes past history of gastrointestinal bleeding from AVMs, essential hypertension, degenerative joint disease, rheumatoid arthritis, paroxysmal atrial fibrillation, second degree heart block, steroid-induced myopathy, and stage III chronic kidney disease. She is not on anticoagulation for the atrial fibrillation because of the prior history of GI bleeding.   Social history:confirmed; she has averaged 1/3 ppd for decades ;but quit for @ least 8 years  Family history:no FH cancer or DM  Comprehensive review of systems is negative except for variable pedal edema and occasional tachycardia. Constitutional: No fever,significant weight change, fatigue  Eyes: No redness, discharge, pain, vision change ENT/mouth: No nasal congestion,  purulent discharge, earache,change in hearing ,sore throat  Cardiovascular: No chest pain, paroxysmal nocturnal dyspnea, claudication  Respiratory: No cough, sputum production,hemoptysis, DOE , significant snoring,apnea   Gastrointestinal: No heartburn,dysphagia,abdominal pain, nausea / vomiting,rectal bleeding, melena,change in bowels Genitourinary: No dysuria,hematuria, pyuria,  incontinence, nocturia at present  Musculoskeletal: No joint stiffness, joint swelling, weakness,pain at this time  Dermatologic: No rash, pruritus, change in appearance of skin Neurologic: No dizziness,headache,syncope, seizures, numbness , tingling Psychiatric: No significant anxiety , depression, insomnia, anorexia Endocrine: No change in hair/skin/ nails, excessive thirst, excessive hunger, excessive urination  Hematologic/lymphatic: No  significant bruising, lymphadenopathy,abnormal bleeding Allergy/immunology:  No itchy/ watery eyes, significant sneezing, urticaria, angioedema  Physical exam:  Pertinent or positive findings:She appears younger than her stated age. There is alternating exotropia. Initially there was significant exotropia of the left eye; but it did also occur on the right. She has dense arcus senilis. Complete dentures are present. Heart rate and rhythm are irregular. She has minor rales at the bases. 2+ pitting edema is noted over the feet. Posterior tibial pulses are decreased. She has clubbing of the nailbeds. The right index finger is nicotine stained. She has a resting tremor of the left hand.  General appearance:Adequately nourished; no acute distress , increased work of breathing is present.   Lymphatic: No lymphadenopathy about the head, neck, axilla . Eyes: No conjunctival inflammation or lid edema is present. There is no scleral icterus. Ears:  External ear exam shows no significant lesions or deformities.   Nose:  External nasal examination shows no deformity or inflammation. Nasal mucosa are pink and moist without lesions ,exudates Oral exam: lips and gums are healthy appearing.There is no oropharyngeal erythema or exudate . Neck:  No thyromegaly, masses, tenderness noted.    Heart:  No gallop, murmur, click, rub .  Abdomen:Bowel sounds are normal. Abdomen is soft and nontender with no organomegaly, hernias,masses. GU: deferred as previously addressed. Extremities:  No cyanosis  Neurologic exam : Strength equal  in upper & lower extremities Balance,Rhomberg,finger to nose testing could not be completed due to clinical state Deep tendon reflexes are equal Skin: Warm & dry w/o tenting. No significant lesions or rash.  See clinical summary under each active problem in the Problem List with associated updated therapeutic plan

## 2015-06-22 NOTE — Assessment & Plan Note (Signed)
Resolved

## 2015-06-22 NOTE — Care Management Note (Signed)
Case Management Note  Patient Details  Name: Eileen Santana MRN: [id number] Date of Birth: 15-Oct-1930   Expected Discharge Date:                  Expected Discharge Plan:  Home/Self Care  In-House Referral:  NA  Discharge planning Services  CM Consult  Post Acute Care Choice:  NA Choice offered to:  NA  DME Arranged:    DME Agency:     HH Arranged:    HH Agency:     Status of Service:  Completed, signed off  Medicare Important Message Given:  Yes Date Medicare IM Given:    Medicare IM give by:    Date Additional Medicare IM Given:    Additional Medicare Important Message give by:     If discussed at Long Length of Stay Meetings, dates discussed:    Additional Comments: Patient discharging to [facility] today. CSW aware and making arrangements. No Further CM needs.   Erika Hussar, Chrystine Oiler, RN 06/22/2015, 1:57 PM

## 2015-06-22 NOTE — Assessment & Plan Note (Signed)
Return to baseline with creatinine of 0.98

## 2015-06-22 NOTE — Progress Notes (Signed)
Report called to [facility], nurse at the [facility].  Answered all questions at this time. Will transport pt to [facility].

## 2015-06-22 NOTE — Care Management Important Message (Signed)
Important Message  Patient Details  Name: Eileen Santana MRN: [id number] Date of Birth: March 22, 1930   Medicare Important Message Given:  Yes    Yulian Gosney, Chrystine Oiler, RN 06/22/2015, 11:19 AM

## 2015-06-22 NOTE — Patient Instructions (Addendum)
Family was asked to contact Dr. Adaline Sill office for results of urine culture possibly collected 06/05/15 as outpatient. Please FAX admission note to Dr Loleta Chance.

## 2015-06-22 NOTE — Discharge Summary (Signed)
Physician Discharge Summary  Eileen Santana VOJ:[id number] DOB: 09/14/1930 DOA: 06/18/2015  PCP: [name], MD  Admit date: 06/18/2015 Discharge date: 06/22/2015  Time spent: 32 minutes  Recommendations for Outpatient Follow-up:  1. BMP and CBC in 1 week. 2. Consider stopping Xanax 3. Patient requesting ongoing treatment for lower extremity edema, gentle diuresis   Discharge Diagnoses:  Principal Problem:   UTI (urinary tract infection) Active Problems:   HTN (hypertension), benign   Chronic back pain   Weakness of both lower extremities   Lactic acidosis   Elevated troponin I level   Acute renal failure superimposed on stage 3 chronic kidney disease (HCC)   Cough   Hospital course  Patient had acute kidney injury with creatinine of 1.58 but this responded to IV fluids. An creatinine is now less than 1. Patient's urinating tract infection was treated with Rocephin IV 1 g daily for 3 days. Patient's urine culture did not grow anything. Patient had bilateral lower extremity weakness. CT head negative. This is somewhat chronic, just not to this degree. There is nothing focal on her neurological exam. Patient did complain of lower extremity pain which is also chronic so Neurontin was started. It was thought this may be contributing to confusion that family solids in the hospital so this was stopped. Patient was evaluated by physical therapy and occupational therapy and inpatient rehabilitation stay was advised. No changes were made to the patient's blood pressure regimen. Patient has chronic thrombocytopenia and her platelets were monitored no acute changes. Patient's Imuran and prednisone for her rheumatoid arthritis were continued. Patient's chronic kidney disease remained close to baseline. Patient showed no evidence of exacerbation of her COPD while here.  Discharge Condition: Good and stable  Diet recommendation: Heart healthy renal  Filed Weights   06/18/15 1734 06/18/15 2316  06/20/15 0659  Weight: 69.854 kg (154 lb) 67.6 kg (149 lb 0.5 oz) 69.536 kg (153 lb 4.8 oz)    History of present illness:   HPI: Eileen Santana is a 82 y.o. female with a history of GI bleeds, essential hypertension, DJD, second degree heart block, rheumatoid arthritis, PAF (CHADS 2 VASC score of 4) not on anticoagulation secondary to GI bleeds, stage III chronic kidney disease, steroid-induced myopathy, feet pain, ambulation with walker secondary to feet pain. Patient seen for increasing weakness in lower extremities for the past 24 hours. This was unable to stand and walk this morning and states that her legs just gave out. No. Using a provoking factors. Patient walks with walker at baseline. 2 days ago was started on ciprofloxacin for UTI and was also started on Neurontin for chronic leg pain. Denies fevers, chills, nausea, vomiting, diarrhea, abdominal pain.  Procedures:  None  Consultations:  none  Discharge Exam: Filed Vitals:   06/21/15 2100 06/22/15 0500  BP: 107/73 113/85  Pulse: 96 88  Temp: 97.9 F (36.6 C) 97.6 F (36.4 C)  Resp: 20 18     General:  No diaphoresis, anxious, no acute distress  Cardiovascular: Regular rate and rhythm no murmurs rubs or gallops  Respiratory: Clear to auscultation bilaterally no more breathing  Abdomen: Nondistended bowel sounds normal nontender palpation  Musculoskeletal: Moving all extremities, no deformity, 5 out of 5 strength   Neuro: A&Ox3  Discharge Instructions    Current Discharge Medication List    CONTINUE these medications which have NOT CHANGED   Details  acetaminophen (TYLENOL) 325 MG tablet Take 650 mg by mouth every 6 (six) hours as needed. For  pain    ALPRAZolam (XANAX) 0.25 MG tablet Take 0.125 mg by mouth daily as needed for anxiety.     aspirin 81 MG chewable tablet Chew 2 tablets (162 mg total) by mouth daily.    azaTHIOprine (IMURAN) 50 MG tablet TAKE 1 AND 1/2 TABLETS BY MOUTH ONCE DAILY Qty: 60  tablet, Refills: 4    cetirizine (ZYRTEC) 10 MG tablet Take 10 mg by mouth daily as needed for allergies.    Cholecalciferol (VITAMIN D) 2000 UNITS tablet Take 2,000 Units by mouth daily.    ciprofloxacin (CIPRO) 250 MG tablet Take 250 mg by mouth 2 (two) times daily. 10 day course starting on 06/16/15    Coenzyme Q10 200 MG capsule Take 200 mg by mouth daily.    Darbepoetin Alfa (ARANESP) 500 MCG/ML SOSY injection Inject 500 mcg into the skin every 21 ( twenty-one) days. Reported on 05/12/2015    DEXILANT 60 MG capsule TAKE 1 CAPSULE BY MOUTH EVERY OTHER DAY Qty: 45 capsule, Refills: 3    diltiazem (TIAZAC) 360 MG 24 hr capsule Take 1 capsule (360 mg total) by mouth daily. Qty: 30 capsule, Refills: 1    fish oil-omega-3 fatty acids 1000 MG capsule Take 1 g by mouth daily.   Associated Diagnoses: Pancytopenia; Elevated liver enzymes    folic acid (FOLVITE) 800 MCG tablet Take 800 mcg by mouth daily.    Associated Diagnoses: Pancytopenia (HCC)    furosemide (LASIX) 20 MG tablet Take 10-20 mg by mouth daily as needed for fluid. For fluid retention   Associated Diagnoses: Anemia; Elevated liver enzymes    gabapentin (NEURONTIN) 100 MG capsule Take 100 mg by mouth at bedtime.     lidocaine-prilocaine (EMLA) cream Apply 1 application topically as needed (pain).     Magnesium 400 MG TABS Take 1 tablet by mouth daily.     metoprolol (LOPRESSOR) 50 MG tablet Take 1 tablet 2 times daily on Saturday and Sunday and resume one and a half tablets 2 times daily on Monday 04/06/14.    Multiple Vitamins-Minerals (MULTIVITAMIN WITH MINERALS) tablet Take 1 tablet by mouth daily.     Associated Diagnoses: Pancytopenia    oxyCODONE-acetaminophen (PERCOCET/ROXICET) 5-325 MG tablet Take 1-2 tablets by mouth every 4 (four) hours as needed for severe pain. Qty: 12 tablet, Refills: 0    polyethylene glycol (MIRALAX / GLYCOLAX) packet Take 17 g by mouth daily. Qty: 14 each, Refills: 0    potassium  chloride SA (K-DUR,KLOR-CON) 20 MEQ tablet TAKE 1 TABLET BY MOUTH ONCE DAILY Qty: 30 tablet, Refills: 1    predniSONE (DELTASONE) 5 MG tablet Take 7.5 mg by mouth daily with breakfast.     spironolactone (ALDACTONE) 50 MG tablet TAKE 1 TABLET BY MOUTH DAILY Qty: 30 tablet, Refills: 1    Travoprost, BAK Free, (TRAVATAN) 0.004 % SOLN ophthalmic solution Place 1 drop into both eyes at bedtime.    ursodiol (ACTIGALL) 300 MG capsule Take 300 mg by mouth 2 (two) times daily.    bisacodyl (DULCOLAX) 10 MG suppository Place 1 suppository (10 mg total) rectally as needed for moderate constipation. Qty: 14 suppository, Refills: 1   Associated Diagnoses: Other constipation    fluticasone (FLONASE) 50 MCG/ACT nasal spray Place 2 sprays into both nostrils daily as needed for allergies.        Allergies  Allergen Reactions  . Iohexol Swelling    IV Dye   . Ivp Dye [Iodinated Diagnostic Agents] Swelling    Hives  . Naprosyn [  Naproxen] Hives and Itching  . Red Blood Cells Swelling and Dermatitis    With blood transfusion 2012  . Verapamil     Heart block (2nd degree) Takes Cardizem   . Vicodin [Hydrocodone-Acetaminophen] Hives and Itching  . Sulfa Antibiotics Itching and Rash      The results of significant diagnostics from this hospitalization (including imaging, microbiology, ancillary and laboratory) are listed below for reference.    Significant Diagnostic Studies: Dg Chest 2 View  06/18/2015  CLINICAL DATA:  Acute onset of bilateral lower extremity weakness and swelling. Initial encounter. EXAM: CHEST  2 VIEW COMPARISON:  Chest radiograph performed 03/15/2015 FINDINGS: The lungs are well-aerated. Pulmonary vascularity is at the upper limits of normal. There is no evidence of focal opacification, pleural effusion or pneumothorax. The heart is enlarged.  No acute osseous abnormalities are seen. IMPRESSION: Cardiomegaly.  Lungs remain grossly clear. Electronically Signed   By: Roanna Raider M.D.   On: 06/18/2015 19:22   Dg Lumbar Spine 2-3 Views  06/19/2015  CLINICAL DATA:  Low back pain EXAM: LUMBAR SPINE - 2-3 VIEW COMPARISON:  MRI 03/15/2015 and x-ray 1/28/ 17 FINDINGS: Three views of the lumbar spine submitted. No acute fracture or subluxation. Again noted significant disc space flattening with vacuum disc phenomenon at L4-L5 and L5-S1 level. Facet degenerative changes L5 level. Atherosclerotic calcifications of abdominal aorta and iliac arteries are noted. Mild lower thoracic levoscoliosis. IMPRESSION: No acute fracture or subluxation. Again noted osteoarthritic changes at L4-L5 and L5-S1 level. Electronically Signed   By: Natasha Mead M.D.   On: 06/19/2015 12:13   Dg Pelvis 1-2 Views  06/19/2015  CLINICAL DATA:  Low back pain, leg pain EXAM: PELVIS - 1-2 VIEW COMPARISON:  CT scan 03/31/2014 FINDINGS: Single frontal view of the pelvis submitted. No acute fracture or subluxation. Degenerative changes left SI joint again noted. Degenerative changes pubic symphysis. Mild degenerative changes bilateral hip joints with superior acetabular spurring. IMPRESSION: No acute fracture or subluxation. Osteoarthritic changes as described above. Electronically Signed   By: Natasha Mead M.D.   On: 06/19/2015 12:19   Dg Sacrum/coccyx  06/19/2015  CLINICAL DATA:  Low back pain leg pain, no known injury EXAM: SACRUM AND COCCYX - 2+ VIEW COMPARISON:  03/31/2014 FINDINGS: Three views of the sacrum and coccyx submitted. No acute fracture or subluxation. There is diffuse osteopenia. Significant osteoarthritic changes left SI joint. Degenerative changes pubic symphysis. There is a healed stress fracture in S3 level sacrum best seen on lateral view with alignment preserved. IMPRESSION: No acute fracture or subluxation. There is diffuse osteopenia. Significant osteoarthritic changes left SI joint. Degenerative changes pubic symphysis. There is a healed stress fracture in S3 level sacrum best seen on lateral view  with alignment preserved. Electronically Signed   By: Natasha Mead M.D.   On: 06/19/2015 12:18   Ct Head Wo Contrast  06/18/2015  CLINICAL DATA:  Bilateral leg weakness. EXAM: CT HEAD WITHOUT CONTRAST TECHNIQUE: Contiguous axial images were obtained from the base of the skull through the vertex without intravenous contrast. COMPARISON:  03/13/2015 FINDINGS: Brain: No evidence of acute infarction, hemorrhage, extra-axial collection, ventriculomegaly, or mass effect. There is moderate brain parenchymal atrophy and mild bilateral deep white matter microvascular ischemia. Vascular: Vascular calcifications at the skullbase. Skull: Negative for fracture or focal lesion. Sinuses/Orbits: No acute findings. Other: None. IMPRESSION: No acute intracranial abnormality. Atrophy, chronic microvascular disease. Electronically Signed   By: Ted Mcalpine M.D.   On: 06/18/2015 19:43   Dg  Chest Port 1 View  06/20/2015  CLINICAL DATA:  Cough EXAM: PORTABLE CHEST 1 VIEW COMPARISON:  06/18/2015 chest radiograph. FINDINGS: Stable cardiomediastinal silhouette with moderate cardiomegaly. No pneumothorax. Probable trace bilateral pleural effusions. Cephalization of the pulmonary vasculature without overt pulmonary edema. No acute consolidative airspace disease. IMPRESSION: Stable moderate cardiomegaly. Cephalization of the pulmonary vasculature without overt pulmonary edema. No acute consolidative airspace disease. Electronically Signed   By: Delbert Phenix M.D.   On: 06/20/2015 16:37    Microbiology: Recent Results (from the past 240 hour(s))  Urine culture     Status: None   Collection Time: 06/18/15  6:45 PM  Result Value Ref Range Status   Specimen Description URINE, CLEAN CATCH  Final   Special Requests NONE  Final   Culture NO GROWTH Performed at [facility]   Final   Report Status 06/20/2015 FINAL  Final     Labs: Basic Metabolic Panel:  Recent Labs Lab 06/18/15 1820 06/19/15 0347 06/20/15 0710  06/21/15 1135 06/22/15 0434  NA 132* 135 136 133* 133*  K 4.8 4.1 4.5 3.9 4.6  CL 95* 103 105 105 103  CO2 29 25 24 24 25   GLUCOSE 152* 126* 92 99 101*  BUN 25* 21* 19 20 20   CREATININE 1.58* 1.11* 1.12* 0.96 0.98  CALCIUM 10.9* 9.2 9.5 9.0 9.2   Liver Function Tests:  Recent Labs Lab 06/18/15 1820 06/19/15 0347 06/20/15 0710  AST 256* 202* 227*  ALT 26 20 22   ALKPHOS 85 63 64  BILITOT 2.0* 1.5* 1.9*  PROT 5.9* 4.8* 5.4*  ALBUMIN 2.7* 2.2* 2.4*   No results for input(s): LIPASE, AMYLASE in the last 168 hours. No results for input(s): AMMONIA in the last 168 hours. CBC:  Recent Labs Lab 06/18/15 1820 06/19/15 0347 06/20/15 0710 06/22/15 0434  WBC 4.5 3.7* 4.8 3.6*  NEUTROABS 3.9  --   --   --   HGB 12.1 10.6* 12.4 11.4*  HCT 35.8* 31.4* 37.2 33.6*  MCV 84.2 84.2 84.9 83.8  PLT 61* 48* 38* 36*   Cardiac Enzymes:  Recent Labs Lab 06/18/15 1820 06/18/15 2129  TROPONINI 0.05* 0.03   BNP: BNP (last 3 results)  Recent Labs  03/13/15 1429 06/20/15 1534  BNP 426.0* 552.0*    ProBNP (last 3 results) No results for input(s): PROBNP in the last 8760 hours.  CBG:  Recent Labs Lab 06/18/15 1739  GLUCAP 168*       Signed:  05/13/15 MD  FACP  [facility] 06/22/2015, 9:03 AM

## 2015-06-22 NOTE — Clinical Social Work Placement (Signed)
   CLINICAL SOCIAL WORK PLACEMENT  NOTE  Date:  06/22/2015  Patient Details  Name: Eileen Santana MRN: [id number] Date of Birth: August 13, 1930  Clinical Social Work is seeking post-discharge placement for this patient at the Skilled  Nursing Facility level of care (*CSW will initial, date and re-position this form in  chart as items are completed):  Yes   Patient/family provided with [facility] Clinical Social Work Department's list of facilities offering this level of care within the geographic area requested by the patient (or if unable, by the patient's family).  Yes   Patient/family informed of their freedom to choose among providers that offer the needed level of care, that participate in Medicare, Medicaid or managed care program needed by the patient, have an available bed and are willing to accept the patient.  Yes   Patient/family informed of [facility]'s ownership interest in [facility] and [facility], as well as of the fact that they are under no obligation to receive care at these facilities.  PASRR submitted to EDS on 06/19/15     PASRR number received on 06/19/15     Existing PASRR number confirmed on       FL2 transmitted to all facilities in geographic area requested by pt/family on 06/22/15     FL2 transmitted to all facilities within larger geographic area on       Patient informed that his/her managed care company has contracts with or will negotiate with certain facilities, including the following:        Yes   Patient/family informed of bed offers received.  Patient chooses bed at [facility]     Physician recommends and patient chooses bed at      Patient to be transferred to [facility] on  .  Patient to be transferred to facility by       Patient family notified on   of transfer.  Name of family member notified:        PHYSICIAN       Additional Comment:    _______________________________________________ Karn Cassis, LCSW 06/22/2015, 10:29 AM [phone]

## 2015-06-22 NOTE — Assessment & Plan Note (Signed)
Mild elevation of one of 3 troponin levels; normal on follow-up

## 2015-06-22 NOTE — Telephone Encounter (Signed)
Patient's daughter, Shamaine Mulkern called and wanted to let Dr. Karilyn Cota know that they patient has been in the hospital and may be sent to a skilled care facility for intense therapy.  She just wanted to let us know.  [phone]

## 2015-06-22 NOTE — Progress Notes (Signed)
Physical Therapy Treatment Patient Details Name: Eileen Santana MRN: [id number] DOB: 27-Nov-1930 Today's Date: 06/22/2015    History of Present Illness Eileen Santana is an 82yo black female who comes to [facility] after progressive weakness in BLE over last 30 days; admitted for UTI. PMH: GIB, HTN, RA, 2nd degree Heart Block, steroid enduced myopathy, chronic bila tfoot pain. Last PT note from reports R sided L4/L5 disc protrusion and sacral stress fractures. Pt presenting today with continued weakness/lethargy adn moderate LEE bilat, which she reports is a chronic issue. At baseline, pt amb household distances only, with SPC.     PT Comments    Pt received in bed, and was agreeable to PT tx.  Pt with continued delayed response to instructions, but oriented.  Pt demonstrated much improved LE strength, and she was able to perform supine exercises, as well as sit<>stand with Min Guard/Supervision.  Pt ambulated 81ft with RW and Min guard.  Will continue to progress with LE and lumbar exercises, as well as gait distance and endurance.  Continue to recommend SNF.    Follow Up Recommendations  SNF     Equipment Recommendations  None recommended by PT (DME will be determined at next level of care. )    Recommendations for Other Services       Precautions / Restrictions Precautions Precautions: Fall Restrictions Weight Bearing Restrictions: No    Mobility  Bed Mobility Overal bed mobility: Needs Assistance Bed Mobility: Supine to Sit     Supine to sit: Min guard     General bed mobility comments: Educated pt on log roll, however pt performed a modified log roll instead.   Transfers Overall transfer level: Needs assistance Equipment used: Rolling walker (2 wheeled) Transfers: Sit to/from Stand Sit to Stand: Min guard;Supervision         General transfer comment: from the bed, and again from the chair.  Aid reported that she was sitting up in the chair earlier today, and she got  back into the bed by herself.  Ambulation/Gait Ambulation/Gait assistance: Min guard Ambulation Distance (Feet): 30 Feet Assistive device: Rolling walker (2 wheeled) Gait Pattern/deviations: Step-to pattern;Ataxic;Narrow base of support;Trunk flexed     General Gait Details: Pt demonstrates poor foot placement of the R foot when stepping.   Stairs            Wheelchair Mobility    Modified Rankin (Stroke Patients Only)       Balance     Sitting balance-Leahy Scale: Good     Standing balance support: Bilateral upper extremity supported Standing balance-Leahy Scale: Fair                      Cognition Arousal/Alertness:  (delayed response) Behavior During Therapy: WFL for tasks assessed/performed Overall Cognitive Status: No family/caregiver present to determine baseline cognitive functioning                      Exercises General Exercises - Lower Extremity Ankle Circles/Pumps: AROM;Both;20 reps Heel Slides: Strengthening;Both;10 reps Other Exercises Other Exercises: Abdominal set x 10 reps with 5 sec hold Other Exercises: SKTC 1 rep each side with 30 second hold with cues for breathing.  Other Exercises: DKTC x 1 rep with 30 second hold with assistance to hold LE's, and cues for breathing.     General Comments        Pertinent Vitals/Pain Pain Assessment: No/denies pain    Home Living Family/patient expects to be  discharged to:: Private residence Living Arrangements: Children Available Help at Discharge: Family;Available PRN/intermittently Type of Home: House Home Access: Stairs to enter Entrance Stairs-Rails: Left Home Layout: Two level;Able to live on main level with bedroom/bathroom Home Equipment: Dan Humphreys - 2 wheels;Cane - quad;Cane - single point;Bedside commode;Grab bars - tub/shower      Prior Function Level of Independence: Independent with assistive device(s)      Comments: per pt report she uses a cane, but has a RW; Pt  is independent in ADL, and gets help with IADl from daugthers.    PT Goals (current goals can now be found in the care plan section) Acute Rehab PT Goals Patient Stated Goal: imrpove leg strength.  PT Goal Formulation: With patient Time For Goal Achievement: 07/03/15 Potential to Achieve Goals: Fair Progress towards PT goals: Progressing toward goals    Frequency  Min 3X/week    PT Plan Current plan remains appropriate    Co-evaluation             End of Session Equipment Utilized During Treatment: Gait belt Activity Tolerance: Patient tolerated treatment well Patient left: in chair;with chair alarm set;with call bell/phone within reach     Time: 1107-1131 PT Time Calculation (min) (ACUTE ONLY): 24 min  Charges:  $Gait Training: 8-22 mins $Therapeutic Exercise: 8-22 mins                    G Codes:      Beth Raeven Pint, PT, DPT X: [phone]

## 2015-06-22 NOTE — Evaluation (Signed)
Occupational Therapy Evaluation Patient Details Name: Eileen Santana MRN: [id number] DOB: 1930-08-12 Today's Date: 06/22/2015    History of Present Illness Eileen Santana is an 82yo black female who comes to [facility] after progressive weakness in BLE over last 30 days; admitted for UTI. PMH: GIB, HTN, RA, 2nd degree Heart Block, steroid enduced myopathy, chronic bila tfoot pain. Last PT note from reports R sided L4/L5 disc protrusion and sacral stress fractures. Pt presenting today with continued weakness/lethargy adn moderate LEE bilat, which she reports is a chronic issue. At baseline, pt amb household distances only, with SPC.    Clinical Impression   Pt sitting up in recliner and agreeable to participate in OT evaluation. Transfer not completed as patient had recently transferred to recliner with Nurse tech. Patient with functional strength in BUE although LUE shoulder flexion slightly weak (4/5). Recommend discharge to SNF where patient will benefit from skilled OT services to increase functional performance during ADL tasks and functional transfers.     Follow Up Recommendations  SNF    Equipment Recommendations  Tub/shower bench       Precautions / Restrictions Precautions Precautions: Fall Restrictions Weight Bearing Restrictions: No              ADL                                         General ADL Comments: Patient is usually Mod I for BADL tasks. Due to recent BLE weakness she requires SBA-Min A for bathing and dressing. Therapist did not complete transfer as patient was placed in recliner by Nurse tech. Nurse tech reports that patient was a Min A and did prefer to grab onto things such as the wall for support as a walker was not used.                Pertinent Vitals/Pain Pain Assessment: No/denies pain     Hand Dominance Right   Extremity/Trunk Assessment Upper Extremity Assessment Upper Extremity Assessment: Overall WFL for tasks  assessed   Lower Extremity Assessment Lower Extremity Assessment: Defer to PT evaluation       Communication Communication Communication: No difficulties   Cognition Arousal/Alertness: Awake/alert (delayed response to questions) Behavior During Therapy: WFL for tasks assessed/performed Overall Cognitive Status: No family/caregiver present to determine baseline cognitive functioning                                Home Living Family/patient expects to be discharged to:: Private residence Living Arrangements: Children Available Help at Discharge: Family;Available PRN/intermittently Type of Home: House Home Access: Stairs to enter [employer] of Steps: 3 Entrance Stairs-Rails: Left Home Layout: Two level;Able to live on main level with bedroom/bathroom Alternate Level Stairs-Number of Steps: pt does not use the 2nd floor   Bathroom Shower/Tub: Tub/shower unit         Home Equipment: Walker - 2 wheels;Cane - quad;Cane - single point;Bedside commode;Grab bars - tub/shower          Prior Functioning/Environment Level of Independence: Independent with assistive device(s)        Comments: per pt report she uses a cane, but has a RW; Pt is independent in ADL, and gets help with IADl from daugthers.     OT Diagnosis: Generalized weakness   OT Problem List: Decreased strength  OT Treatment/Interventions:                       End of Session    Activity Tolerance: Patient tolerated treatment well Patient left: in chair;with call bell/phone within reach;with chair alarm set   Time: 0865-7846 OT Time Calculation (min): 18 min Charges:  OT General Charges $OT Visit: 1 Procedure OT Evaluation $OT Eval Low Complexity: 1 Procedure G-Codes:    Eileen Santana, OTR/L,CBIS  [phone]  06/22/2015, 9:00 AM

## 2015-06-23 ENCOUNTER — Other Ambulatory Visit: Payer: Self-pay | Admitting: *Deleted

## 2015-06-23 MED ORDER — OXYCODONE-ACETAMINOPHEN 5-325 MG PO TABS: ORAL_TABLET | ORAL | Status: DC

## 2015-06-23 NOTE — Telephone Encounter (Signed)
[facility] nursing 

## 2015-06-24 ENCOUNTER — Encounter: Payer: Self-pay | Admitting: Internal Medicine

## 2015-06-24 NOTE — Telephone Encounter (Signed)
Noted  

## 2015-06-24 NOTE — Progress Notes (Signed)
This encounter was created in error - please disregard.

## 2015-06-25 ENCOUNTER — Other Ambulatory Visit (INDEPENDENT_AMBULATORY_CARE_PROVIDER_SITE_OTHER): Payer: Self-pay | Admitting: *Deleted

## 2015-06-25 ENCOUNTER — Encounter (INDEPENDENT_AMBULATORY_CARE_PROVIDER_SITE_OTHER): Payer: Self-pay | Admitting: *Deleted

## 2015-06-25 DIAGNOSIS — R790 Abnormal level of blood mineral: Secondary | ICD-10-CM

## 2015-06-26 ENCOUNTER — Encounter: Payer: Self-pay | Admitting: Internal Medicine

## 2015-06-26 ENCOUNTER — Non-Acute Institutional Stay (SKILLED_NURSING_FACILITY): Payer: Medicare Other | Admitting: Internal Medicine

## 2015-06-26 DIAGNOSIS — R600 Localized edema: Secondary | ICD-10-CM | POA: Diagnosis not present

## 2015-06-26 DIAGNOSIS — R531 Weakness: Secondary | ICD-10-CM | POA: Diagnosis not present

## 2015-06-26 DIAGNOSIS — N183 Chronic kidney disease, stage 3 unspecified: Secondary | ICD-10-CM

## 2015-06-26 DIAGNOSIS — R059 Cough, unspecified: Secondary | ICD-10-CM

## 2015-06-26 DIAGNOSIS — R05 Cough: Secondary | ICD-10-CM

## 2015-06-26 NOTE — Progress Notes (Signed)
Location:    [facility]urs[facility] Home Room Number: 155/P Place of Service:  SNF (31) Provider:  Jeanelle Malling, MD  Patient Care Team: Mirna Mires, MD as PCP - General (Family Medicine) Malissa Hippo, MD as Consulting Physician (Gastroenterology)  Extended Emergency Contact Information Primary Emergency Contact: [place], Truax Address: [address]          [place], [place] [address] [place] Home Phone: [phone] Work Phone: [phone] Mobile Phone: [phone] Relation: Daughter Secondary Emergency Contact: Cummings,Linda          [place], [place] [address] [place] Home Phone: [phone] Work Phone: [phone] Mobile Phone: [phone] Relation: Daughter  Code Status:  Full Code Goals of care: Advanced Directive information Advanced Directives 06/26/2015  Does patient have an advance directive? Yes  Type of Advance Directive -  Does patient want to make changes to advanced directive? No - Patient declined  Copy of advanced directive(s) in chart? Yes  Would patient like information on creating an advanced directive? -     Chief Complaint  Patient presents with  . Acute Visit    Patients c/o Has a cough    HPI:  Pt is a 82 y.o. female seen today for an acute visit for A cough-apparently possibly an element of blood-tinged sputum-nursing noted at this afternoon however with her family she's had a cough since her hospitalization.  She does have a history of allergic rhinitis does have an order for Zyrtec as needed.  She does not complain of shortness of breath-her O2 saturations have been in the 90s.  Patient has a complicated medical history she was admitted to the hospital with extensive lower extremity weakness-this was eventually thought to be multifactorial largely caused by infection with an elevated lactic acidosis and acute on chronic renal insufficiency.  Her creatinine was 1.58 this did come down to 0.98 with IV  fluids.  Suspected UTI initially treated with Rocephin culture did not grow out anything.  CT of her head was negative.  Patient also had been on Neurontin this was stopped in the hospital secondary to increased confusion.  She does have a history of rheumatoid arthritis she is on Imuran and prednisone.  .  She also has a history of appears of allergic rhinitis has been on Zyrtec as needed.  Nursing staff today reports that she has a cough productive possibly of's of small amount of blood-tinged sputum-she is afebrile vital signs appear to be stable.  Talking with family apparently she's had a cough since she was in the hospital.  Family is concerned about Xanax they feel this makes her somnolent and will discontinue this apparently there is a when necessary order for this-also again they do not want her on Neurontin secondary to lethargy concerns as well the past.  She does continue with fairly significant lower extremity edema I do per hospital discharge summary see recommendation for gentle diuresis she is on spironolactone as well 50 mg daily-.  I note a BNP done in the hospital was 552.       Past Medical History  Diagnosis Date  . History of GI bleed     Associated with NSAIDS  . Iron deficiency anemia     Transfusion dependent  . DJD (degenerative joint disease)   . Essential hypertension, benign   . History of colitis   . Ileitis   . Pancytopenia   . Autoimmune hepatitis (HCC)     With leukocytoclastic vasculitis  .  Heart block AV second degree March 2013    a. Cardiology consult note 06/2013: "Question of previous second degree heart block, although review of cardiology notes indicates that this may have been actually blocked PACs when the patient was on verapamil."  . Bronchitis   . Steroid-induced myopathy   . Renal calculus 08/12/2011  . Rheumatoid arthritis(714.0)   . Colon ulcer 04/2010    NSAID related  . Gastric ulcer 04/2010    NSAID related  . UGI  bleed 09/20/2011    Focal area of gastritis oozing of blood  . Gastric AVM 09/20/2011  . Candida esophagitis (HCC) 09/20/2011  . Paroxysmal atrial fibrillation (HCC)     Not on anticoag due to PMH GIB/AVM  . CKD (chronic kidney disease), stage III   . Cholestatic hepatitis   . Paroxysmal atrial flutter (HCC)     Not on anticoag 2/2 hx of GIB/AVM - dx 06/2013.  . Multifocal atrial tachycardia (HCC)   . Hypertrophic cardiomyopathy (HCC)     Echo 03/2011: severe LVH suggesting possble infiltrate cardiomyopathy or advanced hypertensive heart disease, increased  echogenicity of the ventricular septum as well as the pericardium, EF >70% with end systolicmid-cavitary obliteration of the ventricle, stage 1 DD, mild MR, small IVC.  Marland Kitchen Hypomagnesemia   . Chronic renal disease, stage 3, moderately decreased glomerular filtration rate between 30-59 mL/min/1.73 square meter 02/03/2014  . Anemia of chronic renal failure, stage 3 (moderate) 02/03/2014  . Low back pain   . 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 Biatrial enlargement 04/04/2014  . History of pneumonia 12/2013  . Atrial fibrillation [facility])    Past Surgical History  Procedure Laterality Date  . Colonoscopy  04/2010  . Upper gastrointestinal endoscopy  04/2010  . Esophagogastroduodenoscopy  09/20/2011    Status post APC.  Marland Kitchen Esophagogastroduodenoscopy  09/20/2011    Procedure: ESOPHAGOGASTRODUODENOSCOPY (EGD);  Surgeon: Malissa Hippo, MD;  [facility];  Service: Endoscopy;  Laterality: N/A;  . Cyst removal hand      Elbow  . Sebaceous cyst removed      bilateral elbows  . Colonoscopy N/A 07/04/2014    Procedure: COLONOSCOPY;  Surgeon: Malissa Hippo, MD;  Location: [facility];  Service: Endoscopy;  Laterality: N/A;  730  . Esophagogastroduodenoscopy N/A 07/04/2014    Procedure: ESOPHAGOGASTRODUODENOSCOPY (EGD);  Surgeon: Malissa Hippo, MD;  [facility]: [facility];  Service: Endoscopy;  Laterality: N/A;  . Givens capsule study N/A  07/14/2014    Procedure: GIVENS CAPSULE STUDY;  Surgeon: Malissa Hippo, MD;  Location: [facility];  Service: Endoscopy;  Laterality: N/A;  730    Allergies  Allergen Reactions  . Iohexol Swelling    IV Dye   . Ivp Dye [Iodinated Diagnostic Agents] Swelling    Hives  . Naprosyn [Naproxen] Hives and Itching  . Red Blood Cells Swelling and Dermatitis    With blood transfusion 2012  . Verapamil     Heart block (2nd degree) Takes Cardizem   . Vicodin [Hydrocodone-Acetaminophen] Hives and Itching  . Sulfa Antibiotics Itching and Rash    Current Facility-Administered Medications on File Prior to Visit  Medication Dose Route Frequency Provider Last Rate Last Dose  . 0.9 %  sodium chloride infusion   Intravenous Continuous Ellouise Newer, PA-C   Stopped at 10/04/13 1230  . diphenhydrAMINE (BENADRYL) capsule 25 mg  25 mg Oral Q6H PRN Glenford Peers, MD   25 mg at 05/13/11 [phone]  Current Outpatient Prescriptions on File Prior to Visit  Medication Sig Dispense Refill  . acetaminophen (TYLENOL) 325 MG tablet Take 650 mg by mouth every 6 (six) hours as needed. For pain    . ALPRAZolam (XANAX) 0.25 MG tablet Take 0.5 tablets (0.125 mg total) by mouth daily as needed for anxiety. 30 tablet 0  . aspirin 81 MG chewable tablet Chew 2 tablets (162 mg total) by mouth daily.    Marland Kitchen azaTHIOprine (IMURAN) 50 MG tablet TAKE 1 AND 1/2 TABLETS BY MOUTH ONCE DAILY 60 tablet 4  . bisacodyl (DULCOLAX) 10 MG suppository Place 1 suppository (10 mg total) rectally as needed for moderate constipation. 14 suppository 1  . Cholecalciferol (VITAMIN D) 2000 UNITS tablet Take 2,000 Units by mouth daily.    . Coenzyme Q10 200 MG capsule Take 200 mg by mouth daily.    Marland Kitchen DEXILANT 60 MG capsule TAKE 1 CAPSULE BY MOUTH EVERY OTHER DAY 45 capsule 3  . diltiazem (TIAZAC) 360 MG 24 hr capsule Take 1 capsule (360 mg total) by mouth daily. 30 capsule 1  . fish oil-omega-3 fatty acids 1000 MG capsule Take 1 g by mouth daily.     . fluticasone (FLONASE) 50 MCG/ACT nasal spray Place 2 sprays into both nostrils daily as needed for allergies.     . folic acid (FOLVITE) 800 MCG tablet Take 800 mcg by mouth daily.     Marland Kitchen lidocaine-prilocaine (EMLA) cream Apply 1 application topically as needed (pain).     . Magnesium 400 MG TABS Take 1 tablet by mouth daily.     . Multiple Vitamins-Minerals (MULTIVITAMIN WITH MINERALS) tablet Take 1 tablet by mouth daily.      Marland Kitchen oxyCODONE-acetaminophen (PERCOCET/ROXICET) 5-325 MG tablet Take one tablet by mouth every 4 hours as needed for severe pain. Max APAP 3gm/24hrs from all sources 180 tablet 0  . polyethylene glycol (MIRALAX / GLYCOLAX) packet Take 17 g by mouth daily. 14 each 0  . potassium chloride SA (K-DUR,KLOR-CON) 20 MEQ tablet TAKE 1 TABLET BY MOUTH ONCE DAILY 30 tablet 1  . predniSONE (DELTASONE) 5 MG tablet Take 7.5 mg by mouth daily with breakfast.     . spironolactone (ALDACTONE) 50 MG tablet TAKE 1 TABLET BY MOUTH DAILY 30 tablet 1  . Travoprost, BAK Free, (TRAVATAN) 0.004 % SOLN ophthalmic solution Place 1 drop into both eyes at bedtime.    . ursodiol (ACTIGALL) 300 MG capsule Take 300 mg by mouth 2 (two) times daily.      Review of Systems   This is somewhat limited since patient is not speaking Robyne Askew also provided by family in the room.  In general no fever chills noted.  Skin does not complain of rashes or itching.  Head ears eyes nose mouth and throat does have a history of allergic rhinitis is not complaining of sore throat.  Respiratory again productive cough as noted-does not complain of shortness of breath.  Cardiac does not complain of chest pain does have apparently chronic lower extremity edema that waxes and wanes.  GI does not complain of nausea or vomiting or abdominal discomfort.  He musculoskeletal has lower extremity weakness but does not complain currently of joint pain.  Neurologic is not complaining of dizziness or headache.  Psych  apparently patient was quite functional prior to hospitalization apparently has been somewhat more lethargic and weak during her hospitalization.    Immunization History  Administered Date(s) Administered  . Influenza Split 09/19/2011  . Influenza,inj,Quad PF,36+ Mos  09/21/2012, 10/20/2014  . Influenza-Unspecified 10/03/2013   Pertinent  Health Maintenance Due  Topic Date Due  . DEXA SCAN  06/25/2016 (Originally 11/23/1997)  . PNA vac Low Risk Adult (1 of 2 - PCV13) 06/25/2016 (Originally 11/23/1997)  . INFLUENZA VACCINE  08/04/2015   Fall Risk  12/23/2013 12/02/2013 11/07/2013 10/04/2013 09/02/2013  Falls in the past year? No No No No No  Risk for fall due to : - Impaired balance/gait - - -   Functional Status Survey:    Filed Vitals:   06/26/15 1558  BP: 107/54  Pulse: 96  Temp: 97.4 F (36.3 C)  TempSrc: Oral  Resp: 20  Height: 5\' 5"  (1.651 m)  Weight: 153 lb 6.4 oz (69.582 kg)   Body mass index is 25.53 kg/(m^2). Physical Exam   In general this is a frail elderly female who does not appear to be in any distress she does appear quite weak however.  Her skin is warm and dry.  Oropharynx is clear mucous membranes fairly moist.  Chest she has shallow air entry there is no labored breathing at could not appreciate any overt congestion but respiratory effort was somewhat poor.  Heart is regular irregular rate and rhythm without murmur gallop or rub she has I would say 2-3 plus lower extremity edema pedal pulses difficult to palpate secondary to edema.  Abdomen is soft nontender positive bowel sounds.  Muscle skeletal is able to move all extremities 4 but does have generalized weakness most no slower lower extremities bilaterally.  Neurologic speech is clear I could not appreciate lateralizing findings cranial nerves appear grossly intact her speech is clear although she does not talk much.  Psych-apparently has had some increased confusion during her  hospitalization-does not talk much but when asked who the president was she did quickly respond that it was Trump    Labs reviewed:  Recent Labs  10/20/14 1030 01/27/15 1319  06/04/15 1037  06/20/15 0710 06/21/15 1135 06/22/15 0434  NA 135 134*  < > 132*  < > 136 133* 133*  K 3.7 4.0  < > 4.4  < > 4.5 3.9 4.6  CL 100* 102  < > 97*  < > 105 105 103  CO2 28 25  < > 24  < > 24 24 25   GLUCOSE 167* 193*  < > 149*  < > 92 99 101*  BUN 26* 24*  < > 17  < > 19 20 20   CREATININE 1.34* 1.50*  < > 1.26*  < > 1.12* 0.96 0.98  CALCIUM 9.7 10.2  < > 9.9  < > 9.5 9.0 9.2  MG 1.6* 1.7  --  1.3*  --   --   --   --   < > = values in this interval not displayed.  Recent Labs  06/18/15 1820 06/19/15 0347 06/20/15 0710  AST 256* 202* 227*  ALT 26 20 22   ALKPHOS 85 63 64  BILITOT 2.0* 1.5* 1.9*  PROT 5.9* 4.8* 5.4*  ALBUMIN 2.7* 2.2* 2.4*    Recent Labs  03/15/15 1032  06/04/15 1037 06/18/15 1820 06/19/15 0347 06/20/15 0710 06/22/15 0434  WBC 4.1  < > 3.9* 4.5 3.7* 4.8 3.6*  NEUTROABS 3.3  --  2.8 3.9  --   --   --   HGB 13.2  < > 11.2* 12.1 10.6* 12.4 11.4*  HCT 38.2  < > 33.4* 35.8* 31.4* 37.2 33.6*  MCV 83.2  < > 82.1 84.2 84.2 84.9  83.8  PLT 63*  < > 95* 61* 48* 38* 36*  < > = values in this interval not displayed. Lab Results  Component Value Date   TSH 0.977 06/19/2015   Lab Results  Component Value Date   HGBA1C 6.5* 03/05/2011   Lab Results  Component Value Date   CHOL 196 03/06/2011   HDL 68 03/06/2011   LDLCALC 120* 03/06/2011   TRIG 41 03/06/2011   CHOLHDL 2.9 03/06/2011    Significant Diagnostic Results in last 30 days:  Dg Chest 2 View  06/18/2015  CLINICAL DATA:  Acute onset of bilateral lower extremity weakness and swelling. Initial encounter. EXAM: CHEST  2 VIEW COMPARISON:  Chest radiograph performed 03/15/2015 FINDINGS: The lungs are well-aerated. Pulmonary vascularity is at the upper limits of normal. There is no evidence of focal opacification,  pleural effusion or pneumothorax. The heart is enlarged.  No acute osseous abnormalities are seen. IMPRESSION: Cardiomegaly.  Lungs remain grossly clear. Electronically Signed   By: Roanna Raider M.D.   On: 06/18/2015 19:22   Dg Lumbar Spine 2-3 Views  06/19/2015  CLINICAL DATA:  Low back pain EXAM: LUMBAR SPINE - 2-3 VIEW COMPARISON:  MRI 03/15/2015 and x-ray 1/28/ 17 FINDINGS: Three views of the lumbar spine submitted. No acute fracture or subluxation. Again noted significant disc space flattening with vacuum disc phenomenon at L4-L5 and L5-S1 level. Facet degenerative changes L5 level. Atherosclerotic calcifications of abdominal aorta and iliac arteries are noted. Mild lower thoracic levoscoliosis. IMPRESSION: No acute fracture or subluxation. Again noted osteoarthritic changes at L4-L5 and L5-S1 level. Electronically Signed   By: Natasha Mead M.D.   On: 06/19/2015 12:13   Dg Pelvis 1-2 Views  06/19/2015  CLINICAL DATA:  Low back pain, leg pain EXAM: PELVIS - 1-2 VIEW COMPARISON:  CT scan 03/31/2014 FINDINGS: Single frontal view of the pelvis submitted. No acute fracture or subluxation. Degenerative changes left SI joint again noted. Degenerative changes pubic symphysis. Mild degenerative changes bilateral hip joints with superior acetabular spurring. IMPRESSION: No acute fracture or subluxation. Osteoarthritic changes as described above. Electronically Signed   By: Natasha Mead M.D.   On: 06/19/2015 12:19   Dg Sacrum/coccyx  06/19/2015  CLINICAL DATA:  Low back pain leg pain, no known injury EXAM: SACRUM AND COCCYX - 2+ VIEW COMPARISON:  03/31/2014 FINDINGS: Three views of the sacrum and coccyx submitted. No acute fracture or subluxation. There is diffuse osteopenia. Significant osteoarthritic changes left SI joint. Degenerative changes pubic symphysis. There is a healed stress fracture in S3 level sacrum best seen on lateral view with alignment preserved. IMPRESSION: No acute fracture or subluxation.  There is diffuse osteopenia. Significant osteoarthritic changes left SI joint. Degenerative changes pubic symphysis. There is a healed stress fracture in S3 level sacrum best seen on lateral view with alignment preserved. Electronically Signed   By: Natasha Mead M.D.   On: 06/19/2015 12:18   Ct Head Wo Contrast  06/18/2015  CLINICAL DATA:  Bilateral leg weakness. EXAM: CT HEAD WITHOUT CONTRAST TECHNIQUE: Contiguous axial images were obtained from the base of the skull through the vertex without intravenous contrast. COMPARISON:  03/13/2015 FINDINGS: Brain: No evidence of acute infarction, hemorrhage, extra-axial collection, ventriculomegaly, or mass effect. There is moderate brain parenchymal atrophy and mild bilateral deep white matter microvascular ischemia. Vascular: Vascular calcifications at the skullbase. Skull: Negative for fracture or focal lesion. Sinuses/Orbits: No acute findings. Other: None. IMPRESSION: No acute intracranial abnormality. Atrophy, chronic microvascular disease. Electronically Signed  By: Ted Mcalpine M.D.   On: 06/18/2015 19:43   Dg Chest Port 1 View  06/20/2015  CLINICAL DATA:  Cough EXAM: PORTABLE CHEST 1 VIEW COMPARISON:  06/18/2015 chest radiograph. FINDINGS: Stable cardiomediastinal silhouette with moderate cardiomegaly. No pneumothorax. Probable trace bilateral pleural effusions. Cephalization of the pulmonary vasculature without overt pulmonary edema. No acute consolidative airspace disease. IMPRESSION: Stable moderate cardiomegaly. Cephalization of the pulmonary vasculature without overt pulmonary edema. No acute consolidative airspace disease. Electronically Signed   By: Delbert Phenix M.D.   On: 06/20/2015 16:37    Assessment/Plan  #1-cough-apparently according the family there is some chronicity to this but nursing is concerned feel possibly may be increased from baseline-with patient's fragile status Will check a chest x-ray 2 view-also will make her Zyrtec  routine and add Mucinex 600 mg twice a day for 5 days to help to make this hopefully more productive-also monitor vital signs pulse ox closely for 72 hours.  Also will update a CBC with differential as well as metabolic panel tomorrow morning-I do note she does have a history of thrombocytopenia which is chronic most recently 36,000 on lab done on 06/22/2015. #2 in regards to acute on chronic renal failure creatinine 0.98 on 06/22/2015 will update this again tomorrow as well.  #3 history of lower extremity edema she is on Aldactone 50 mg a day also per discharge review recommending additional gentle diuresis if needed-her responsible party is actually a nurse and quite familiar with her feels edema has increased somewhat-we'll start Lasix 20 mg every other day-and again update a metabolic panel tomorrow to [facility] eye  on her renal function will update a metabolic panel as well next week to make sure this is stable. Obtain daily weights notify provider of gain greater than 3 pounds  #4 history of anxiety-she does have a when necessary Xanax order but apparently this is somewhat sedating according to family will discontinue this-and monitor.  At this point patient does not appear to be in any distress but continues to be quite weak frail -will await chest x-ray results and lab work results as well as monitor her closely clinically.  This plan was discussed with her family at bedside.  [id number] note greater than 35 minutes spent assessing patient-reviewing her chart-reviewing her labs-discussing her status with nursing as well as with her family in the room-and coordinating and formulating a plan a-of note greater than 50% of time spent coordinating plan a with nursing and family input as well as chart review     London Sheer, [place] [phone]

## 2015-06-27 ENCOUNTER — Encounter (HOSPITAL_COMMUNITY)
Admission: RE | Admit: 2015-06-27 | Discharge: 2015-06-27 | Disposition: A | Discharge status: Home / Self Care | Payer: No Typology Code available for payment source | Source: Ambulatory Visit | Attending: Oncology | Admitting: Oncology

## 2015-06-27 DIAGNOSIS — D638 Anemia in other chronic diseases classified elsewhere: Secondary | ICD-10-CM | POA: Diagnosis not present

## 2015-06-27 LAB — CBC WITH DIFFERENTIAL/PLATELET
Basophils Absolute: 0 10*3/uL (ref 0.0–0.1)
Basophils Relative: 1 %
EOS ABS: 0 10*3/uL (ref 0.0–0.7)
Eosinophils Relative: 2 %
HCT: 33.2 % — ABNORMAL LOW (ref 36.0–46.0)
Hemoglobin: 11.3 g/dL — ABNORMAL LOW (ref 12.0–15.0)
LYMPHS ABS: 0.3 10*3/uL — AB (ref 0.7–4.0)
Lymphocytes Relative: 14 %
MCH: 28.9 pg (ref 26.0–34.0)
MCHC: 34 g/dL (ref 30.0–36.0)
MCV: 84.9 fL (ref 78.0–100.0)
MONO ABS: 0.3 10*3/uL (ref 0.1–1.0)
Monocytes Relative: 11 %
NEUTROS PCT: 72 %
Neutro Abs: 1.7 10*3/uL (ref 1.7–7.7)
PLATELETS: 46 10*3/uL — AB (ref 150–400)
RBC: 3.91 MIL/uL (ref 3.87–5.11)
RDW: 24.5 % — ABNORMAL HIGH (ref 11.5–15.5)
WBC: 2.3 10*3/uL — ABNORMAL LOW (ref 4.0–10.5)

## 2015-06-27 LAB — COMPREHENSIVE METABOLIC PANEL
ALBUMIN: 2.3 g/dL — AB (ref 3.5–5.0)
ALT: 21 U/L (ref 14–54)
AST: 212 U/L — AB (ref 15–41)
Alkaline Phosphatase: 63 U/L (ref 38–126)
Anion gap: 7 (ref 5–15)
BUN: 26 mg/dL — AB (ref 6–20)
CHLORIDE: 106 mmol/L (ref 101–111)
CO2: 23 mmol/L (ref 22–32)
Calcium: 9.6 mg/dL (ref 8.9–10.3)
Creatinine, Ser: 1 mg/dL (ref 0.44–1.00)
GFR calc Af Amer: 59 mL/min — ABNORMAL LOW (ref >60)
GFR, EST NON AFRICAN AMERICAN: 51 mL/min — AB (ref >60)
GLUCOSE: 81 mg/dL (ref 65–99)
Potassium: 4.8 mmol/L (ref 3.5–5.1)
Sodium: 136 mmol/L (ref 135–145)
TOTAL PROTEIN: 5.2 g/dL — AB (ref 6.5–8.1)
Total Bilirubin: 2.6 mg/dL — ABNORMAL HIGH (ref 0.3–1.2)

## 2015-06-29 ENCOUNTER — Encounter (HOSPITAL_COMMUNITY)
Admission: RE | Admit: 2015-06-29 | Discharge: 2015-06-29 | Disposition: A | Discharge status: Home / Self Care | Payer: No Typology Code available for payment source | Source: Skilled Nursing Facility | Attending: *Deleted | Admitting: *Deleted

## 2015-06-29 LAB — BASIC METABOLIC PANEL
ANION GAP: 10 (ref 5–15)
BUN: 38 mg/dL — AB (ref 6–20)
CALCIUM: 10.1 mg/dL (ref 8.9–10.3)
CO2: 21 mmol/L — ABNORMAL LOW (ref 22–32)
Chloride: 104 mmol/L (ref 101–111)
Creatinine, Ser: 1.65 mg/dL — ABNORMAL HIGH (ref 0.44–1.00)
GFR calc Af Amer: 32 mL/min — ABNORMAL LOW (ref >60)
GFR, EST NON AFRICAN AMERICAN: 28 mL/min — AB (ref >60)
GLUCOSE: 123 mg/dL — AB (ref 65–99)
Potassium: 5.5 mmol/L — ABNORMAL HIGH (ref 3.5–5.1)
Sodium: 135 mmol/L (ref 135–145)

## 2015-06-29 LAB — CBC WITH DIFFERENTIAL/PLATELET
BASOS ABS: 0 10*3/uL (ref 0.0–0.1)
BASOS PCT: 0 %
EOS PCT: 1 %
Eosinophils Absolute: 0 10*3/uL (ref 0.0–0.7)
HCT: 35.2 % — ABNORMAL LOW (ref 36.0–46.0)
Hemoglobin: 12.2 g/dL (ref 12.0–15.0)
Lymphocytes Relative: 10 %
Lymphs Abs: 0.4 10*3/uL — ABNORMAL LOW (ref 0.7–4.0)
MCH: 29 pg (ref 26.0–34.0)
MCHC: 34.7 g/dL (ref 30.0–36.0)
MCV: 83.8 fL (ref 78.0–100.0)
MONO ABS: 0.4 10*3/uL (ref 0.1–1.0)
Monocytes Relative: 9 %
Neutro Abs: 3.2 10*3/uL (ref 1.7–7.7)
Neutrophils Relative %: 80 %
PLATELETS: 51 10*3/uL — AB (ref 150–400)
RBC: 4.2 MIL/uL (ref 3.87–5.11)
RDW: 24.3 % — AB (ref 11.5–15.5)
WBC: 4 10*3/uL (ref 4.0–10.5)

## 2015-06-30 ENCOUNTER — Non-Acute Institutional Stay (SKILLED_NURSING_FACILITY): Payer: Medicare Other | Admitting: Internal Medicine

## 2015-06-30 ENCOUNTER — Encounter (HOSPITAL_COMMUNITY)
Admission: RE | Admit: 2015-06-30 | Discharge: 2015-06-30 | Disposition: A | Discharge status: Home / Self Care | Payer: No Typology Code available for payment source | Source: Skilled Nursing Facility | Attending: *Deleted | Admitting: *Deleted

## 2015-06-30 ENCOUNTER — Encounter: Payer: Self-pay | Admitting: Internal Medicine

## 2015-06-30 DIAGNOSIS — D61818 Other pancytopenia: Secondary | ICD-10-CM

## 2015-06-30 DIAGNOSIS — R29898 Other symptoms and signs involving the musculoskeletal system: Secondary | ICD-10-CM | POA: Diagnosis not present

## 2015-06-30 DIAGNOSIS — N183 Chronic kidney disease, stage 3 (moderate): Secondary | ICD-10-CM

## 2015-06-30 DIAGNOSIS — N179 Acute kidney failure, unspecified: Secondary | ICD-10-CM

## 2015-06-30 DIAGNOSIS — R918 Other nonspecific abnormal finding of lung field: Secondary | ICD-10-CM

## 2015-06-30 LAB — BASIC METABOLIC PANEL
Anion gap: 11 (ref 5–15)
BUN: 48 mg/dL — AB (ref 6–20)
CALCIUM: 9.9 mg/dL (ref 8.9–10.3)
CHLORIDE: 102 mmol/L (ref 101–111)
CO2: 22 mmol/L (ref 22–32)
CREATININE: 2.15 mg/dL — AB (ref 0.44–1.00)
GFR calc Af Amer: 23 mL/min — ABNORMAL LOW (ref >60)
GFR calc non Af Amer: 20 mL/min — ABNORMAL LOW (ref >60)
Glucose, Bld: 118 mg/dL — ABNORMAL HIGH (ref 65–99)
Potassium: 5.7 mmol/L — ABNORMAL HIGH (ref 3.5–5.1)
SODIUM: 135 mmol/L (ref 135–145)

## 2015-06-30 NOTE — Progress Notes (Signed)
Facility Location: [facility] Room Number: 155  PCP: [name], MD [address] [place] [address]   This is a nursing facility follow up for specific acute issue of progressive renal insufficiency.   Interim medical record and care since last [facility] visit was updated with review of diagnostic studies and change in clinical status since last visit were documented.   HPI: She presented with transient peripheral neuropathy without definite etiology. She also exhibited pancytopenia ; but most recent CBC reveals significant improvement in white count, hematocrit, as well as platelet count. The platelet count still remained 51,000. On 6/26 she was noted to have significant renal insufficiency with a creatinine of 1.65, BUN 38, and GFR of 32. Potassium was elevated at 5.5. Spoironoolactone and potassium were discontinued; and BMET was repeated today. There was further progression of the insufficiency with a creatinine of 2.15. Off the potassium her potassium level was 5.7. Med list reviewed revealed no other nephrotoxic agents except some possible role of Dexilant and magnesium. The former was prescribed because of bleeding ulcers and is given every other day. Blood pressure is well controlled. She was placed on doxycycline 6/23 for a cough with some blood-tinged sputum. Apparently the cough is a chronic issue. The film apparently was done portable and is not in the Epic system.   Review of systems: The edema has been an acute problem for the last several weeks. TED hose were of benefit in controlling the edema while hospitalized. Family is concerned that she is taking Percocet for the apparent neuropathic pain in the legs. She is becoming progressively more somnolent She has a history of autoimmune hepatitis. Her daughter who is a nurse denies dark urine or light stools. She has been constipated. She continues to have the pain but w/o weakness in the  legs   Cardiovascular: No chest pain, palpitations,paroxysmal nocturnal dyspnea, claudication  Respiratory: No cough, sputum production,hemoptysis, DOE , significant snoring,apnea   Gastrointestinal: No heartburn,dysphagia,abdominal pain, nausea / vomiting,rectal bleeding, melena Genitourinary: No dysuria,hematuria, pyuria,  incontinence, nocturia Musculoskeletal: No joint stiffness, joint swelling, weakness,pain Dermatologic: No rash, pruritus, change in appearance of skin Neurologic: No dizziness,headache,syncope, seizures, numbness , tingling Endocrine: No change in hair/skin/ nails, excessive thirst, excessive hunger, excessive urination  Hematologic/lymphatic: No significant bruising, lymphadenopathy,abnormal bleeding  Physical exam:  Pertinent or positive findings: she is profoundly lethargic. Responses are slurred and difficult to discern. Sclerae are slightly "muddy" but not icteric. Arcus senilis is noted. Heart rhythm is irregular. She has 1+ edema of the lower extremities to mid shin Pedal pulses are decreased. She exhibits intermittent involuntary muscular twitching.   General appearance:Adequately nourished; no acute distress , increased work of breathing is present.   Lymphatic: No lymphadenopathy about the head, neck, axilla . Eyes: No conjunctival inflammation or lid edema is present. Ears:  External ear exam shows no significant lesions or deformities.   Nose:  External nasal examination shows no deformity or inflammation. Nasal mucosa are pink and moist without lesions ,exudates Oral exam: lips and gums are healthy appearing.There is no oropharyngeal erythema or exudate . Neck:  No thyromegaly, masses, tenderness noted.    Heart:  No gallop, murmur, click, rub .  Lungs:Chest clear to auscultation without wheezes, rhonchi,rales , rubs. Abdomen:Bowel sounds are normal. Abdomen is soft and nontender with no organomegaly, hernias,masses. GU: deferred  Extremities:  No  cyanosis, clubbing  Neurologic exam : Strength equal  in upper & lower extremities  but generally decreased Balance,Rhomberg,finger to nose testing could not be completed due to clinical state Deep tendon reflexes are 1/2+ Skin: Warm & dry w/o tenting. No significant lesions or rash.    See summary under each active problem in the Problem List with associated updated therapeutic plan

## 2015-06-30 NOTE — Assessment & Plan Note (Signed)
Complete doxycycline unless she has any adverse reactions to this. If cough recurs CXR will be completed at [facility]

## 2015-06-30 NOTE — Patient Instructions (Addendum)
New orders for Matrix entry in am. BMET  Hepatic profile See multiple discontinued medications

## 2015-06-30 NOTE — Assessment & Plan Note (Signed)
All 3 parameters improved; significant thrombocytopenia persists

## 2015-06-30 NOTE — Assessment & Plan Note (Addendum)
CPK , EMG/NCT and Rheumatology consult if symptoms recur or progress

## 2015-06-30 NOTE — Assessment & Plan Note (Addendum)
Monitor renal function off spironolactone and potassium According to the literature magnesium and Dexilant may have some renal concerns

## 2015-07-01 ENCOUNTER — Encounter (HOSPITAL_COMMUNITY): Payer: Self-pay | Admitting: Emergency Medicine

## 2015-07-01 ENCOUNTER — Other Ambulatory Visit: Payer: Self-pay

## 2015-07-01 ENCOUNTER — Emergency Department (HOSPITAL_COMMUNITY): Payer: Medicare Other

## 2015-07-01 ENCOUNTER — Encounter: Payer: Self-pay | Admitting: Internal Medicine

## 2015-07-01 ENCOUNTER — Inpatient Hospital Stay (HOSPITAL_COMMUNITY)
Admission: EM | Admit: 2015-07-01 | Discharge: 2015-07-04 | DRG: 194 | Disposition: A | Discharge status: Skilled Nursing Facility | Payer: Medicare Other | Attending: Internal Medicine | Admitting: Internal Medicine

## 2015-07-01 ENCOUNTER — Encounter (HOSPITAL_COMMUNITY)
Admission: AD | Admit: 2015-07-01 | Discharge: 2015-07-01 | Disposition: A | Discharge status: Home / Self Care | Payer: No Typology Code available for payment source | Source: Skilled Nursing Facility | Attending: *Deleted | Admitting: *Deleted

## 2015-07-01 ENCOUNTER — Non-Acute Institutional Stay (SKILLED_NURSING_FACILITY): Payer: Medicare Other | Admitting: Internal Medicine

## 2015-07-01 DIAGNOSIS — K8020 Calculus of gallbladder without cholecystitis without obstruction: Secondary | ICD-10-CM

## 2015-07-01 DIAGNOSIS — R9431 Abnormal electrocardiogram [ECG] [EKG]: Secondary | ICD-10-CM

## 2015-07-01 DIAGNOSIS — I129 Hypertensive chronic kidney disease with stage 1 through stage 4 chronic kidney disease, or unspecified chronic kidney disease: Secondary | ICD-10-CM | POA: Diagnosis present

## 2015-07-01 DIAGNOSIS — Z823 Family history of stroke: Secondary | ICD-10-CM

## 2015-07-01 DIAGNOSIS — R7989 Other specified abnormal findings of blood chemistry: Secondary | ICD-10-CM

## 2015-07-01 DIAGNOSIS — J189 Pneumonia, unspecified organism: Principal | ICD-10-CM

## 2015-07-01 DIAGNOSIS — E872 Acidosis, unspecified: Secondary | ICD-10-CM

## 2015-07-01 DIAGNOSIS — D509 Iron deficiency anemia, unspecified: Secondary | ICD-10-CM

## 2015-07-01 DIAGNOSIS — I422 Other hypertrophic cardiomyopathy: Secondary | ICD-10-CM

## 2015-07-01 DIAGNOSIS — D500 Iron deficiency anemia secondary to blood loss (chronic): Secondary | ICD-10-CM

## 2015-07-01 DIAGNOSIS — Z8262 Family history of osteoporosis: Secondary | ICD-10-CM

## 2015-07-01 DIAGNOSIS — I951 Orthostatic hypotension: Secondary | ICD-10-CM

## 2015-07-01 DIAGNOSIS — Z79899 Other long term (current) drug therapy: Secondary | ICD-10-CM

## 2015-07-01 DIAGNOSIS — D61818 Other pancytopenia: Secondary | ICD-10-CM

## 2015-07-01 DIAGNOSIS — Q2730 Arteriovenous malformation, site unspecified: Secondary | ICD-10-CM

## 2015-07-01 DIAGNOSIS — R1084 Generalized abdominal pain: Secondary | ICD-10-CM | POA: Diagnosis not present

## 2015-07-01 DIAGNOSIS — I4892 Unspecified atrial flutter: Secondary | ICD-10-CM

## 2015-07-01 DIAGNOSIS — M05732 Rheumatoid arthritis with rheumatoid factor of left wrist without organ or systems involvement: Secondary | ICD-10-CM

## 2015-07-01 DIAGNOSIS — M069 Rheumatoid arthritis, unspecified: Secondary | ICD-10-CM | POA: Diagnosis present

## 2015-07-01 DIAGNOSIS — M549 Dorsalgia, unspecified: Secondary | ICD-10-CM

## 2015-07-01 DIAGNOSIS — D631 Anemia in chronic kidney disease: Secondary | ICD-10-CM | POA: Diagnosis present

## 2015-07-01 DIAGNOSIS — Z8673 Personal history of transient ischemic attack (TIA), and cerebral infarction without residual deficits: Secondary | ICD-10-CM

## 2015-07-01 DIAGNOSIS — K920 Hematemesis: Secondary | ICD-10-CM

## 2015-07-01 DIAGNOSIS — D638 Anemia in other chronic diseases classified elsewhere: Secondary | ICD-10-CM

## 2015-07-01 DIAGNOSIS — R778 Other specified abnormalities of plasma proteins: Secondary | ICD-10-CM

## 2015-07-01 DIAGNOSIS — B3781 Candidal esophagitis: Secondary | ICD-10-CM

## 2015-07-01 DIAGNOSIS — F17210 Nicotine dependence, cigarettes, uncomplicated: Secondary | ICD-10-CM | POA: Diagnosis present

## 2015-07-01 DIAGNOSIS — Y95 Nosocomial condition: Secondary | ICD-10-CM | POA: Diagnosis present

## 2015-07-01 DIAGNOSIS — R001 Bradycardia, unspecified: Secondary | ICD-10-CM

## 2015-07-01 DIAGNOSIS — E2740 Unspecified adrenocortical insufficiency: Secondary | ICD-10-CM

## 2015-07-01 DIAGNOSIS — I10 Essential (primary) hypertension: Secondary | ICD-10-CM

## 2015-07-01 DIAGNOSIS — Z8701 Personal history of pneumonia (recurrent): Secondary | ICD-10-CM

## 2015-07-01 DIAGNOSIS — K754 Autoimmune hepatitis: Secondary | ICD-10-CM

## 2015-07-01 DIAGNOSIS — K254 Chronic or unspecified gastric ulcer with hemorrhage: Secondary | ICD-10-CM

## 2015-07-01 DIAGNOSIS — I4719 Other supraventricular tachycardia: Secondary | ICD-10-CM

## 2015-07-01 DIAGNOSIS — R1013 Epigastric pain: Secondary | ICD-10-CM

## 2015-07-01 DIAGNOSIS — G8929 Other chronic pain: Secondary | ICD-10-CM

## 2015-07-01 DIAGNOSIS — Z8249 Family history of ischemic heart disease and other diseases of the circulatory system: Secondary | ICD-10-CM

## 2015-07-01 DIAGNOSIS — I639 Cerebral infarction, unspecified: Secondary | ICD-10-CM

## 2015-07-01 DIAGNOSIS — J430 Unilateral pulmonary emphysema [MacLeod's syndrome]: Secondary | ICD-10-CM

## 2015-07-01 DIAGNOSIS — N183 Chronic kidney disease, stage 3 unspecified: Secondary | ICD-10-CM

## 2015-07-01 DIAGNOSIS — I480 Paroxysmal atrial fibrillation: Secondary | ICD-10-CM | POA: Diagnosis present

## 2015-07-01 DIAGNOSIS — G629 Polyneuropathy, unspecified: Secondary | ICD-10-CM | POA: Diagnosis present

## 2015-07-01 DIAGNOSIS — I248 Other forms of acute ischemic heart disease: Secondary | ICD-10-CM | POA: Diagnosis present

## 2015-07-01 DIAGNOSIS — K922 Gastrointestinal hemorrhage, unspecified: Secondary | ICD-10-CM

## 2015-07-01 DIAGNOSIS — I471 Supraventricular tachycardia: Secondary | ICD-10-CM

## 2015-07-01 DIAGNOSIS — Z825 Family history of asthma and other chronic lower respiratory diseases: Secondary | ICD-10-CM

## 2015-07-01 DIAGNOSIS — D696 Thrombocytopenia, unspecified: Secondary | ICD-10-CM

## 2015-07-01 DIAGNOSIS — R600 Localized edema: Secondary | ICD-10-CM

## 2015-07-01 DIAGNOSIS — K921 Melena: Secondary | ICD-10-CM

## 2015-07-01 DIAGNOSIS — R918 Other nonspecific abnormal finding of lung field: Secondary | ICD-10-CM

## 2015-07-01 DIAGNOSIS — D731 Hypersplenism: Secondary | ICD-10-CM

## 2015-07-01 DIAGNOSIS — G458 Other transient cerebral ischemic attacks and related syndromes: Secondary | ICD-10-CM

## 2015-07-01 DIAGNOSIS — Z7982 Long term (current) use of aspirin: Secondary | ICD-10-CM

## 2015-07-01 DIAGNOSIS — S3210XA Unspecified fracture of sacrum, initial encounter for closed fracture: Secondary | ICD-10-CM

## 2015-07-01 DIAGNOSIS — R109 Unspecified abdominal pain: Secondary | ICD-10-CM

## 2015-07-01 DIAGNOSIS — R410 Disorientation, unspecified: Secondary | ICD-10-CM | POA: Diagnosis not present

## 2015-07-01 DIAGNOSIS — I517 Cardiomegaly: Secondary | ICD-10-CM

## 2015-07-01 DIAGNOSIS — K31819 Angiodysplasia of stomach and duodenum without bleeding: Secondary | ICD-10-CM

## 2015-07-01 DIAGNOSIS — M109 Gout, unspecified: Secondary | ICD-10-CM | POA: Diagnosis present

## 2015-07-01 DIAGNOSIS — I4891 Unspecified atrial fibrillation: Secondary | ICD-10-CM

## 2015-07-01 DIAGNOSIS — R05 Cough: Secondary | ICD-10-CM

## 2015-07-01 DIAGNOSIS — E8809 Other disorders of plasma-protein metabolism, not elsewhere classified: Secondary | ICD-10-CM

## 2015-07-01 DIAGNOSIS — N200 Calculus of kidney: Secondary | ICD-10-CM

## 2015-07-01 DIAGNOSIS — R4701 Aphasia: Secondary | ICD-10-CM

## 2015-07-01 DIAGNOSIS — R059 Cough, unspecified: Secondary | ICD-10-CM

## 2015-07-01 DIAGNOSIS — M05731 Rheumatoid arthritis with rheumatoid factor of right wrist without organ or systems involvement: Secondary | ICD-10-CM

## 2015-07-01 DIAGNOSIS — N189 Chronic kidney disease, unspecified: Secondary | ICD-10-CM

## 2015-07-01 DIAGNOSIS — R29898 Other symptoms and signs involving the musculoskeletal system: Secondary | ICD-10-CM

## 2015-07-01 DIAGNOSIS — N179 Acute kidney failure, unspecified: Secondary | ICD-10-CM

## 2015-07-01 HISTORY — DX: Neuralgia and neuritis, unspecified: M79.2

## 2015-07-01 HISTORY — DX: Other pancytopenia: D61.818

## 2015-07-01 LAB — HEPATIC FUNCTION PANEL
ALT: 27 U/L (ref 14–54)
AST: 214 U/L — AB (ref 15–41)
Albumin: 2.4 g/dL — ABNORMAL LOW (ref 3.5–5.0)
Alkaline Phosphatase: 60 U/L (ref 38–126)
BILIRUBIN DIRECT: 1.4 mg/dL — AB (ref 0.1–0.5)
Indirect Bilirubin: 1.6 mg/dL — ABNORMAL HIGH (ref 0.3–0.9)
Total Bilirubin: 3 mg/dL — ABNORMAL HIGH (ref 0.3–1.2)
Total Protein: 5.3 g/dL — ABNORMAL LOW (ref 6.5–8.1)

## 2015-07-01 LAB — URINE MICROSCOPIC-ADD ON

## 2015-07-01 LAB — LACTIC ACID, PLASMA: Lactic Acid, Venous: 2.8 mmol/L (ref 0.5–1.9)

## 2015-07-01 LAB — URINALYSIS, ROUTINE W REFLEX MICROSCOPIC
Bilirubin Urine: NEGATIVE
GLUCOSE, UA: NEGATIVE mg/dL
Ketones, ur: NEGATIVE mg/dL
Leukocytes, UA: NEGATIVE
Nitrite: NEGATIVE
SPECIFIC GRAVITY, URINE: 1.025 (ref 1.005–1.030)
pH: 6 (ref 5.0–8.0)

## 2015-07-01 LAB — BASIC METABOLIC PANEL
ANION GAP: 7 (ref 5–15)
BUN: 55 mg/dL — ABNORMAL HIGH (ref 6–20)
CHLORIDE: 106 mmol/L (ref 101–111)
CO2: 23 mmol/L (ref 22–32)
Calcium: 9.7 mg/dL (ref 8.9–10.3)
Creatinine, Ser: 2.07 mg/dL — ABNORMAL HIGH (ref 0.44–1.00)
GFR calc Af Amer: 25 mL/min — ABNORMAL LOW (ref >60)
GFR, EST NON AFRICAN AMERICAN: 21 mL/min — AB (ref >60)
GLUCOSE: 85 mg/dL (ref 65–99)
POTASSIUM: 5.6 mmol/L — AB (ref 3.5–5.1)
Sodium: 136 mmol/L (ref 135–145)

## 2015-07-01 LAB — CBC WITH DIFFERENTIAL/PLATELET
Basophils Absolute: 0 10*3/uL (ref 0.0–0.1)
Basophils Relative: 0 %
EOS PCT: 0 %
Eosinophils Absolute: 0 10*3/uL (ref 0.0–0.7)
HCT: 33.9 % — ABNORMAL LOW (ref 36.0–46.0)
HEMOGLOBIN: 11.7 g/dL — AB (ref 12.0–15.0)
LYMPHS ABS: 0.2 10*3/uL — AB (ref 0.7–4.0)
LYMPHS PCT: 3 %
MCH: 29 pg (ref 26.0–34.0)
MCHC: 34.5 g/dL (ref 30.0–36.0)
MCV: 84.1 fL (ref 78.0–100.0)
MONO ABS: 0.4 10*3/uL (ref 0.1–1.0)
MONOS PCT: 9 %
Neutro Abs: 4.6 10*3/uL (ref 1.7–7.7)
Neutrophils Relative %: 88 %
Platelets: 35 10*3/uL — ABNORMAL LOW (ref 150–400)
RBC: 4.03 MIL/uL (ref 3.87–5.11)
RDW: 24.2 % — ABNORMAL HIGH (ref 11.5–15.5)
SMEAR REVIEW: DECREASED
WBC: 5.2 10*3/uL (ref 4.0–10.5)

## 2015-07-01 LAB — TYPE AND SCREEN
ABO/RH(D): A POS
Antibody Screen: NEGATIVE

## 2015-07-01 LAB — COMPREHENSIVE METABOLIC PANEL
ALT: 30 U/L (ref 14–54)
AST: 212 U/L — AB (ref 15–41)
Albumin: 2.3 g/dL — ABNORMAL LOW (ref 3.5–5.0)
Alkaline Phosphatase: 59 U/L (ref 38–126)
Anion gap: 9 (ref 5–15)
BUN: 51 mg/dL — AB (ref 6–20)
CHLORIDE: 105 mmol/L (ref 101–111)
CO2: 22 mmol/L (ref 22–32)
CREATININE: 1.73 mg/dL — AB (ref 0.44–1.00)
Calcium: 9.4 mg/dL (ref 8.9–10.3)
GFR calc Af Amer: 30 mL/min — ABNORMAL LOW (ref >60)
GFR, EST NON AFRICAN AMERICAN: 26 mL/min — AB (ref >60)
Glucose, Bld: 115 mg/dL — ABNORMAL HIGH (ref 65–99)
Potassium: 4.7 mmol/L (ref 3.5–5.1)
SODIUM: 136 mmol/L (ref 135–145)
Total Bilirubin: 2.9 mg/dL — ABNORMAL HIGH (ref 0.3–1.2)
Total Protein: 5.2 g/dL — ABNORMAL LOW (ref 6.5–8.1)

## 2015-07-01 LAB — BRAIN NATRIURETIC PEPTIDE: B Natriuretic Peptide: 497 pg/mL — ABNORMAL HIGH (ref 0.0–100.0)

## 2015-07-01 LAB — TROPONIN I: TROPONIN I: 0.04 ng/mL — AB (ref <0.03)

## 2015-07-01 LAB — POC OCCULT BLOOD, ED: Fecal Occult Bld: POSITIVE — AB

## 2015-07-01 LAB — LIPASE, BLOOD: Lipase: 28 U/L (ref 11–51)

## 2015-07-01 MED ORDER — VANCOMYCIN HCL IN DEXTROSE 1-5 GM/200ML-% IV SOLN
1000.0000 mg | Freq: Once | INTRAVENOUS | Status: AC
  Administered 2015-07-01: 1000 mg via INTRAVENOUS
  Filled 2015-07-01: qty 200

## 2015-07-01 MED ORDER — URSODIOL 300 MG PO CAPS
300.0000 mg | ORAL_CAPSULE | Freq: Two times a day (BID) | ORAL | Status: DC
  Administered 2015-07-02 – 2015-07-04 (×6): 300 mg via ORAL
  Filled 2015-07-01 (×10): qty 1

## 2015-07-01 MED ORDER — PANTOPRAZOLE SODIUM 40 MG IV SOLR
80.0000 mg | Freq: Once | INTRAVENOUS | Status: AC
  Administered 2015-07-01: 80 mg via INTRAVENOUS
  Filled 2015-07-01: qty 80

## 2015-07-01 MED ORDER — GABAPENTIN 100 MG PO CAPS
100.0000 mg | ORAL_CAPSULE | Freq: Every day | ORAL | Status: DC
  Administered 2015-07-02 (×2): 100 mg via ORAL
  Filled 2015-07-01 (×2): qty 1

## 2015-07-01 MED ORDER — DILTIAZEM HCL ER BEADS 240 MG PO CP24
360.0000 mg | ORAL_CAPSULE | Freq: Every day | ORAL | Status: DC
  Filled 2015-07-01 (×2): qty 1

## 2015-07-01 MED ORDER — AZATHIOPRINE 50 MG PO TABS
75.0000 mg | ORAL_TABLET | Freq: Every day | ORAL | Status: DC
  Administered 2015-07-02 – 2015-07-04 (×3): 75 mg via ORAL
  Filled 2015-07-01 (×5): qty 2

## 2015-07-01 MED ORDER — PANTOPRAZOLE SODIUM 40 MG IV SOLR
INTRAVENOUS | Status: AC
  Filled 2015-07-01: qty 160

## 2015-07-01 MED ORDER — SODIUM CHLORIDE 0.9 % IV BOLUS (SEPSIS)
500.0000 mL | Freq: Once | INTRAVENOUS | Status: AC
  Administered 2015-07-01: 500 mL via INTRAVENOUS

## 2015-07-01 MED ORDER — PREDNISONE 5 MG PO TABS
7.5000 mg | ORAL_TABLET | Freq: Every day | ORAL | Status: DC
  Administered 2015-07-02 – 2015-07-04 (×3): 7.5 mg via ORAL
  Filled 2015-07-01 (×3): qty 2

## 2015-07-01 MED ORDER — DEXTROSE 5 % IV SOLN
1.0000 g | Freq: Once | INTRAVENOUS | Status: AC
  Administered 2015-07-01: 1 g via INTRAVENOUS
  Filled 2015-07-01: qty 1

## 2015-07-01 MED ORDER — ACETAMINOPHEN 650 MG RE SUPP: 650.0000 mg | Freq: Four times a day (QID) | RECTAL | Status: DC | PRN

## 2015-07-01 MED ORDER — METOPROLOL TARTRATE 50 MG PO TABS
50.0000 mg | ORAL_TABLET | Freq: Two times a day (BID) | ORAL | Status: DC
  Administered 2015-07-02 – 2015-07-04 (×6): 50 mg via ORAL
  Filled 2015-07-01 (×6): qty 1

## 2015-07-01 MED ORDER — FOLIC ACID 1 MG PO TABS
500.0000 ug | ORAL_TABLET | Freq: Every day | ORAL | Status: DC
  Administered 2015-07-02 – 2015-07-04 (×3): 0.5 mg via ORAL
  Filled 2015-07-01 (×3): qty 1

## 2015-07-01 MED ORDER — SODIUM CHLORIDE 0.9 % IV SOLN
8.0000 mg/h | INTRAVENOUS | Status: DC
  Administered 2015-07-01 – 2015-07-02 (×3): 8 mg/h via INTRAVENOUS
  Filled 2015-07-01 (×8): qty 80

## 2015-07-01 MED ORDER — SODIUM CHLORIDE 0.9 % IV SOLN
INTRAVENOUS | Status: DC
  Administered 2015-07-01 – 2015-07-02 (×4): via INTRAVENOUS

## 2015-07-01 MED ORDER — ACETAMINOPHEN 325 MG PO TABS: 650.0000 mg | ORAL_TABLET | Freq: Four times a day (QID) | ORAL | Status: DC | PRN

## 2015-07-01 MED ORDER — PANTOPRAZOLE SODIUM 40 MG IV SOLR: 40.0000 mg | Freq: Two times a day (BID) | INTRAVENOUS | Status: DC

## 2015-07-01 NOTE — ED Notes (Signed)
CRITICAL VALUE ALERT  Critical value received:  Troponin 0.04  Date of notification:  07/01/15  Time of notification:  2038  Critical value read back:Yes.    Nurse who received alert:  Bronson Curb, RN  MD notified (1st page):  Dr Clarene Duke  Time of first page:  2038  MD notified (2nd page):  Time of second page:  Responding MD:  Dr Clarene Duke  Time MD responded:  2038

## 2015-07-01 NOTE — ED Notes (Signed)
Pt is from [facility] and has been having abd pain with dark emesis and increased confusion per facility.

## 2015-07-01 NOTE — H&P (Addendum)
TRH H&P   Patient Demographics:    Eileen Santana, is a 82 y.o. female  MRN: [id number]   DOB - [birth year]  Admit Date - 07/01/2015  Outpatient Primary MD for the patient is [name], MD  Referring MD/NP/PA: Dr Clarene Duke  Patient coming from: Rehabilitation facility  Chief Complaint  Patient presents with  . Abdominal Pain      HPI:    Eileen Santana  is a 82 y.o. female, Who was brought to the hospital for abdominal pain. Patient's daughter who is a Engineer, civil (consulting) provides most of the history. As per daughter, patient has been at rehabilitation facility and was given Percocet and Xanax 5 days ago after the patient became very confused and drowsy. This medication was stopped and this morning patient was back to her usual self. Patient complained of abdominal pain around 1 PM in the afternoon, and after that had one episode of coffee-ground emesis as per daughter. She has a history of anemia, gastric AVMs requiring multiple blood transfusions in the past. She hasn't had any blood transfusion in the past 6 months. She also has a history of chronic thrombocytopenia. She denies chest pain, shortness of breath. No fever or dysuria.   Review of systems:    In addition to the HPI above, No Fever-chills, No Headache, No changes with Vision or hearing, No problems swallowing food or Liquids, No Chest pain, Cough or Shortness of Breath, No Blood in stool or Urine, No new skin rashes or bruises, No new joints pains-aches,  No new weakness, tingling, numbness in any extremity, No recent weight gain or loss, No polyuria, polydypsia or polyphagia, No significant Mental Stressors.  A full 10 point Review of Systems was done, except as stated above, all other Review of Systems were negative.   With Past History of the following :    Past Medical History  Diagnosis Date  . History of GI bleed     Associated  with NSAIDS  . Iron deficiency anemia     Transfusion dependent  . DJD (degenerative joint disease)   . Essential hypertension, benign   . History of colitis   . Ileitis   . Pancytopenia   . Autoimmune hepatitis (HCC)     With leukocytoclastic vasculitis  . Heart block AV second degree March 2013    a. Cardiology consult note 06/2013: "Question of previous second degree heart block, although review of cardiology notes indicates that this may have been actually blocked PACs when the patient was on verapamil."  . Bronchitis   . Steroid-induced myopathy   . Renal calculus 08/12/2011  . Rheumatoid arthritis(714.0)   . Colon ulcer 04/2010    NSAID related  . Gastric ulcer 04/2010    NSAID related  . UGI bleed 09/20/2011    Focal area of gastritis oozing of blood  . Gastric AVM 09/20/2011  . Candida esophagitis (HCC) 09/20/2011  . Paroxysmal atrial fibrillation (HCC)     Not on anticoag due to PMH GIB/AVM  .  Cholestatic hepatitis   . Paroxysmal atrial flutter (HCC)     Not on anticoag 2/2 hx of GIB/AVM - dx 06/2013.  . Multifocal atrial tachycardia (HCC)   . Hypertrophic cardiomyopathy (HCC)     Echo 03/2011: severe LVH suggesting possble infiltrate cardiomyopathy or advanced hypertensive heart disease, increased  echogenicity of the ventricular septum as well as the pericardium, EF >70% with end systolicmid-cavitary obliteration of the ventricle, stage 1 DD, mild MR, small IVC.  Marland Kitchen Hypomagnesemia   . Chronic renal disease, stage 3, moderately decreased glomerular filtration rate between 30-59 mL/min/1.73 square meter 02/03/2014  . Anemia of chronic renal failure, stage 3 (moderate) 02/03/2014  . Low back pain   . 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 Biatrial enlargement 04/04/2014  . History of pneumonia 12/2013  . Atrial fibrillation (HCC)   . Neuropathic pain     legs  . Pancytopenia [facility])       Past Surgical History  Procedure Laterality Date  . Colonoscopy  04/2010  . Upper  gastrointestinal endoscopy  04/2010  . Esophagogastroduodenoscopy  09/20/2011    Status post APC.  Marland Kitchen Esophagogastroduodenoscopy  09/20/2011    Procedure: ESOPHAGOGASTRODUODENOSCOPY (EGD);  Surgeon: Malissa Hippo, MD;  Location: [facility];  Service: Endoscopy;  Laterality: N/A;  . Cyst removal hand      Elbow  . Sebaceous cyst removed      bilateral elbows  . Colonoscopy N/A 07/04/2014    Procedure: COLONOSCOPY;  Surgeon: Malissa Hippo, MD;  Location: [facility];  Service: Endoscopy;  Laterality: N/A;  730  . Esophagogastroduodenoscopy N/A 07/04/2014    Procedure: ESOPHAGOGASTRODUODENOSCOPY (EGD);  Surgeon: Malissa Hippo, MD;  Location: [facility];  Service: Endoscopy;  Laterality: N/A;  . Givens capsule study N/A 07/14/2014    Procedure: GIVENS CAPSULE STUDY;  Surgeon: Malissa Hippo, MD;  Location: [facility];  Service: Endoscopy;  Laterality: N/A;  730      Social History:     Social History  Substance Use Topics  . Smoking status: Current Some Day Smoker -- 0.25 packs/day for 50 years    Types: Cigarettes    Start date: 04/10/1945  . Smokeless tobacco: Never Used     Comment: 1/3 ppd; stopped for @ least 8 years  . Alcohol Use: No        Family History :     Family History  Problem Relation Age of Onset  . Stroke Mother   . Heart disease Mother 67  . Hypertension Sister   . Hypertension Brother   . Heart disease Father 95  . COPD Brother   . Arthritis Brother   . Osteoporosis Sister   . Cancer Neg Hx   . Diabetes Neg Hx       Home Medications:   Prior to Admission medications   Medication Sig Start Date End Date Taking? Authorizing Provider  acetaminophen (TYLENOL) 325 MG tablet Take 650 mg by mouth every 6 (six) hours as needed. For pain   Yes Historical Provider, MD  aspirin 81 MG chewable tablet Chew 2 tablets (162 mg total) by mouth daily. 04/04/14  Yes Elliot Cousin, MD  azaTHIOprine (IMURAN) 50 MG tablet TAKE 1 AND 1/2 TABLETS BY MOUTH ONCE  DAILY 03/06/15  Yes Len Blalock, NP  bisacodyl (DULCOLAX) 10 MG suppository Place 1 suppository (10 mg total) rectally as needed for moderate constipation. 05/12/15  Yes Malissa Hippo, MD  Cholecalciferol (VITAMIN D) 2000 UNITS  tablet Take 2,000 Units by mouth daily.   Yes Historical Provider, MD  DEXILANT 60 MG capsule TAKE 1 CAPSULE BY MOUTH EVERY OTHER DAY 10/21/14  Yes Malissa Hippo, MD  diltiazem (TIAZAC) 360 MG 24 hr capsule Take 1 capsule (360 mg total) by mouth daily. 01/06/14  Yes Hollice Espy, MD  doxycycline (VIBRA-TABS) 100 MG tablet Take 100 mg by mouth 2 (two) times daily. Starting 06/27/2015 - 07/06/2015.   Yes Historical Provider, MD  folic acid (FOLVITE) 400 MCG tablet Take 400 mcg by mouth daily.   Yes Historical Provider, MD  furosemide (LASIX) 20 MG tablet Take 20 mg by mouth daily as needed for fluid (Give is weight is up 3 lbs.).   Yes Historical Provider, MD  lidocaine-prilocaine (EMLA) cream Apply 1 application topically as needed (pain).  06/20/14  Yes Historical Provider, MD  metoprolol (LOPRESSOR) 50 MG tablet Take 50 mg by mouth 2 (two) times daily.   Yes Historical Provider, MD  polyethylene glycol (MIRALAX / GLYCOLAX) packet Take 17 g by mouth daily. 03/17/15  Yes Marinda Elk, MD  predniSONE (DELTASONE) 5 MG tablet Take 7.5 mg by mouth daily with breakfast.  03/13/15  Yes Historical Provider, MD  Travoprost, BAK Free, (TRAVATAN) 0.004 % SOLN ophthalmic solution 1 drop at bedtime.   Yes Historical Provider, MD  ursodiol (ACTIGALL) 300 MG capsule Take 300 mg by mouth 2 (two) times daily.   Yes Historical Provider, MD     Allergies:     Allergies  Allergen Reactions  . Iohexol Swelling    IV Dye   . Ivp Dye [Iodinated Diagnostic Agents] Swelling    Hives  . Naprosyn [Naproxen] Hives and Itching  . Red Blood Cells Swelling and Dermatitis    With blood transfusion 2012  . Verapamil     Heart block (2nd degree) Takes Cardizem   . Vicodin  [Hydrocodone-Acetaminophen] Hives and Itching  . Sulfa Antibiotics Itching and Rash     Physical Exam:   Vitals  Blood pressure 116/76, pulse 112, temperature 97.5 F (36.4 C), temperature source Oral, resp. rate 22, height  (1.651 m), weight 68.947 kg (152 lb), SpO2 98 %.   1 General- appears in no acute distress  2. Normal affect and insight, Not Suicidal or Homicidal, Awake Alert, Oriented X 3.  3. No F.N deficits, ALL C.Nerves Intact, Strength 5/5 all 4 extremities, Sensation intact all 4 extremities, Plantars down going.  4. Ears and Eyes appear Normal, Conjunctivae clear, PERRLA. Moist Oral Mucosa.  5. Supple Neck, No JVD, No cervical lymphadenopathy appriciated, No Carotid Bruits.  6. Symmetrical Chest wall movement, Good air movement bilaterally, CTAB.  7. RRR, No Gallops, Rubs or Murmurs, No Parasternal Heave.  8. Positive Bowel Sounds, Abdomen Soft, No tenderness, No organomegaly appriciated,No rebound -guarding or rigidity.  9.  No Cyanosis, Normal Skin Turgor, No Skin Rash or Bruise.  10. Good muscle tone,  joints appear normal , no effusions, Normal ROM.      Data Review:    CBC  Recent Labs Lab 06/27/15 0745 06/29/15 0600 07/01/15 1950  WBC 2.3* 4.0 5.2  HGB 11.3* 12.2 11.7*  HCT 33.2* 35.2* 33.9*  PLT 46* 51* 35*  MCV 84.9 83.8 84.1  MCH 28.9 29.0 29.0  MCHC 34.0 34.7 34.5  RDW 24.5* 24.3* 24.2*  LYMPHSABS 0.3* 0.4* 0.2*  MONOABS 0.3 0.4 0.4  EOSABS 0.0 0.0 0.0  BASOSABS 0.0 0.0 0.0   ------------------------------------------------------------------------------------------------------------------  Chemistries  Recent Labs Lab 06/27/15 0745 06/29/15 0600 06/30/15 0700 07/01/15 0740 07/01/15 1950  NA 136 135 135 136 136  K 4.8 5.5* 5.7* 5.6* 4.7  CL 106 104 102 106 105  CO2 23 21* 22 23 22   GLUCOSE 81 123* 118* 85 115*  BUN 26* 38* 48* 55* 51*  CREATININE 1.00 1.65* 2.15* 2.07* 1.73*  CALCIUM 9.6 10.1 9.9 9.7 9.4  AST  212*  --   --  214* 212*  ALT 21  --   --  27 30  ALKPHOS 63  --   --  60 59  BILITOT 2.6*  --   --  3.0* 2.9*   -------------------------------------------------------------------------------------------------------------------  Cardiac Enzymes  Recent Labs Lab 07/01/15 1950  TROPONINI 0.04*   ------------------------------------------------------------------------------------------------------------------    Component Value Date/Time   BNP 497.0* 07/01/2015 1950     ---------------------------------------------------------------------------------------------------------------  Urinalysis    Component Value Date/Time   COLORURINE YELLOW 07/01/2015 2200   APPEARANCEUR CLEAR 07/01/2015 2200   LABSPEC 1.025 07/01/2015 2200   PHURINE 6.0 07/01/2015 2200   GLUCOSEU NEGATIVE 07/01/2015 2200   HGBUR LARGE* 07/01/2015 2200   BILIRUBINUR NEGATIVE 07/01/2015 2200   KETONESUR NEGATIVE 07/01/2015 2200   PROTEINUR TRACE* 07/01/2015 2200   UROBILINOGEN 0.2 07/18/2014 1420   NITRITE NEGATIVE 07/01/2015 2200   LEUKOCYTESUR NEGATIVE 07/01/2015 2200    ----------------------------------------------------------------------------------------------------------------   Imaging Results:    Dg Abd Acute W/chest  07/01/2015  CLINICAL DATA:  Heme-positive stool, nausea vomiting. EXAM: DG ABDOMEN ACUTE W/ 1V CHEST COMPARISON:  Chest x-ray June 20, 2015 FINDINGS: There is patchy consolidation of the lateral right lung base. There is mild interstitial edema. The heart size is enlarged. There is a small right pleural effusion. There is no evidence of bowel obstruction or free air. Degenerative joint changes of the spine are noted. IMPRESSION: Congestive heart failure. Patchy consolidation of the lateral right lung base suspicious for pneumonia. Small right pleural effusion. No evidence of bowel obstruction or free air. Electronically Signed   By: Sherian Rein M.D.   On: 07/01/2015 20:59   Ct Renal  Stone Study  07/01/2015  CLINICAL DATA:  Abdomen pain today.  Blood in the stool. EXAM: CT ABDOMEN AND PELVIS WITHOUT CONTRAST TECHNIQUE: Multidetector CT imaging of the abdomen and pelvis was performed following the standard protocol without IV contrast. COMPARISON:  March 31, 2014 FINDINGS: Lower chest: There are small bilateral pleural effusions. Consolidation of the right middle lobe is identified. There is mild atelectasis of the posterior bilateral lower lobes. The heart size is enlarged. Hepatobiliary: There is a calcified granuloma within the liver. No focal mass is identified in the liver on this noncontrast exam. The gallbladder is normal. Pancreas: No mass or inflammatory process identified on this un-enhanced exam. Spleen: Within normal limits in size. Adrenals/Urinary Tract: There is no hydronephrosis bilaterally. There are left kidney cysts, largest measures 4.3 x 3.9 cm in the midpole. There is no right kidney mass. There is a 2 mm vascular calcification versus non obstructing calcification in the midpole left kidney. The adrenal glands are normal. The bladder is normal. Stomach/Bowel: No evidence of obstruction, inflammatory process, or abnormal fluid collections. There is diverticulosis of colon without diverticulitis. The appendix is normal. Vascular/Lymphatic: Calcified lymph nodes are identified in the porta hepatis. No pathologically enlarged lymph nodes. No evidence of abdominal aortic aneurysm. There is atherosclerosis of the abdominal aorta. Reproductive: No mass or other significant abnormality. Other: Ascites is identified in the abdomen and pelvis. Musculoskeletal: Degenerative  joint changes of the spine are identified. IMPRESSION: No bowel obstruction or diverticulitis. Bilateral pleural effusions with consolidation of the right middle lobe suspicious for pneumonia. Small amount of ascites in the abdomen and pelvis. Electronically Signed   By: Sherian Rein M.D.   On: 07/01/2015 21:08     My personal review of EKG: Atrial fibrillation   Assessment & Plan:    Active Problems:   HCAP (healthcare-associated pneumonia)   Elevated troponin   Chronic thrombocytopenia   Vomiting ? GI bleed    1. Healthcare associated pneumonia- chest x-ray showed consolidation of the right middle lobe suspicious for pneumonia, continue with vancomycin and cefepime per pharmacy consultation. Follow blood cultures. 2. Vomiting ? GI bleed- patient had one episode of ? Coffee-ground emesis as per daughter. Will obtain serial H&H. Hemoglobin is 11.7 at this time. Continue with protonix infusion. 3. Elevated troponin- patient has mild elevation of troponin 0.04, likely from demand ischemia. We'll follow serial troponin 4. Chronic thrombocytopenia- stable, platelet count 35. Will follow CBC in a.m. 5. Atrial fibrillation- continue Cardizem, metoprolol. Patient not on anticoagulation due to GI bleed. Also hold aspirin at this time due to above. 6. History of rheumatoid arthritis- continue prednisone, Imuran. 7. Peripheral neuropathy- restart gabapentin 100 mg daily at bedtime   DVT Prophylaxis-    SCDs   AM Labs Ordered, also please review Full Orders  Family Communication: Admission, patients condition and plan of care including tests being ordered have been discussed with the patient and Her daughter at bedside* who indicate understanding and agree with the plan and Code Status.  Code Status:  Full code  Admission status: Inpatient  Time spent in minutes : 55 min   Dorea Duff S M.D on 07/01/2015 at 11:04 PM  Between 7am to 7pm - Pager - [phone]. After 7pm go to [email or web address] - password [facility]  [facility] - Office  [phone]

## 2015-07-01 NOTE — Progress Notes (Signed)
Pharmacy Note:  Initial antibiotics for Vancomycin ordered by EDP for PNA.  Estimated Creatinine Clearance: 24.5 mL/min (by C-G formula based on Cr of 1.73).   Allergies  Allergen Reactions  . Iohexol Swelling    IV Dye   . Ivp Dye [Iodinated Diagnostic Agents] Swelling    Hives  . Naprosyn [Naproxen] Hives and Itching  . Red Blood Cells Swelling and Dermatitis    With blood transfusion 2012  . Verapamil     Heart block (2nd degree) Takes Cardizem   . Vicodin [Hydrocodone-Acetaminophen] Hives and Itching  . Sulfa Antibiotics Itching and Rash    Filed Vitals:   07/01/15 1955 07/01/15 2115  BP: 103/92 114/72  Pulse: 114 99  Temp:    Resp: 17 18    Anti-infectives    Start     Dose/Rate Route Frequency Ordered Stop   07/01/15 2115  ceFEPIme (MAXIPIME) 1 g in dextrose 5 % 50 mL IVPB     1 g 100 mL/hr over 30 Minutes Intravenous  Once 07/01/15 2108        Plan: Initial doses of Vancomycin X 1 ordered. F/U admission orders for further dosing if therapy continued. Will not need further doses this evening due to renal dysfunction if continued.  Mady Gemma, [facility] 07/01/2015 9:30 PM

## 2015-07-01 NOTE — ED Notes (Signed)
CRITICAL VALUE ALERT  Critical value received:  Lactic acid 2.8  Date of notification:  07/01/15  Time of notification:  2035  Critical value read back:Yes.    Nurse who received alert:  Bronson Curb, Rn  MD notified (1st page):  Dr Clarene Duke  Time of first page:  2035  MD notified (2nd page):  Time of second page:  Responding MD:  Dr Clarene Duke  Time MD responded:  2035

## 2015-07-01 NOTE — Progress Notes (Signed)
Patient ID: Eileen Santana, female   DOB: [birth year], 82 y.o.   MRN: [id number]  This encounter was created in error - please disregard. This encounter was created in error - please disregard.

## 2015-07-01 NOTE — ED Provider Notes (Signed)
CSN: [id number]     Arrival date & time 07/01/15  1854 History   First MD Initiated Contact with Patient 07/01/15 1859     Chief Complaint  Patient presents with  . Abdominal Pain      Patient is a 82 y.o. female presenting with abdominal pain. The history is provided by the patient, the EMS personnel and the nursing home. The history is limited by the condition of the patient (AMS).  Abdominal Pain Pt was seen at 1900. Per EMS and NH report:  NH states pt has been c/o abd "pain" with N/V today. Has been associated with "confusion."  Describes emesis as "dark."  Denies CP/SOB, no back pain, no reported fevers, no diarrhea.    Past Medical History  Diagnosis Date  . History of GI bleed     Associated with NSAIDS  . Iron deficiency anemia     Transfusion dependent  . DJD (degenerative joint disease)   . Essential hypertension, benign   . History of colitis   . Ileitis   . Pancytopenia   . Autoimmune hepatitis (HCC)     With leukocytoclastic vasculitis  . Heart block AV second degree March 2013    a. Cardiology consult note 06/2013: "Question of previous second degree heart block, although review of cardiology notes indicates that this may have been actually blocked PACs when the patient was on verapamil."  . Bronchitis   . Steroid-induced myopathy   . Renal calculus 08/12/2011  . Rheumatoid arthritis(714.0)   . Colon ulcer 04/2010    NSAID related  . Gastric ulcer 04/2010    NSAID related  . UGI bleed 09/20/2011    Focal area of gastritis oozing of blood  . Gastric AVM 09/20/2011  . Candida esophagitis (HCC) 09/20/2011  . Paroxysmal atrial fibrillation (HCC)     Not on anticoag due to PMH GIB/AVM  . Cholestatic hepatitis   . Paroxysmal atrial flutter (HCC)     Not on anticoag 2/2 hx of GIB/AVM - dx 06/2013.  . Multifocal atrial tachycardia (HCC)   . Hypertrophic cardiomyopathy (HCC)     Echo 03/2011: severe LVH suggesting possble infiltrate cardiomyopathy or advanced hypertensive  heart disease, increased  echogenicity of the ventricular septum as well as the pericardium, EF >70% with end systolicmid-cavitary obliteration of the ventricle, stage 1 DD, mild MR, small IVC.  Marland Kitchen Hypomagnesemia   . Chronic renal disease, stage 3, moderately decreased glomerular filtration rate between 30-59 mL/min/1.73 square meter 02/03/2014  . Anemia of chronic renal failure, stage 3 (moderate) 02/03/2014  . Low back pain   . 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 Biatrial enlargement 04/04/2014  . History of pneumonia 12/2013  . Atrial fibrillation (HCC)   . Neuropathic pain     legs  . Pancytopenia [facility])    Past Surgical History  Procedure Laterality Date  . Colonoscopy  04/2010  . Upper gastrointestinal endoscopy  04/2010  . Esophagogastroduodenoscopy  09/20/2011    Status post APC.  Marland Kitchen Esophagogastroduodenoscopy  09/20/2011    Procedure: ESOPHAGOGASTRODUODENOSCOPY (EGD);  Surgeon: Malissa Hippo, MD;  [facility];  Service: Endoscopy;  Laterality: N/A;  . Cyst removal hand      Elbow  . Sebaceous cyst removed      bilateral elbows  . Colonoscopy N/A 07/04/2014    Procedure: COLONOSCOPY;  Surgeon: Malissa Hippo, MD;  Location: [facility];  Service: Endoscopy;  Laterality: N/A;  730  . Esophagogastroduodenoscopy N/A 07/04/2014  Procedure: ESOPHAGOGASTRODUODENOSCOPY (EGD);  Surgeon: Malissa Hippo, MD;  Location: [facility];  Service: Endoscopy;  Laterality: N/A;  . Givens capsule study N/A 07/14/2014    Procedure: GIVENS CAPSULE STUDY;  Surgeon: Malissa Hippo, MD;  Location: [facility];  Service: Endoscopy;  Laterality: N/A;  730   Family History  Problem Relation Age of Onset  . Stroke Mother   . Heart disease Mother 43  . Hypertension Sister   . Hypertension Brother   . Heart disease Father 95  . COPD Brother   . Arthritis Brother   . Osteoporosis Sister   . Cancer Neg Hx   . Diabetes Neg Hx    Social History  Substance Use Topics  . Smoking  status: Current Some Day Smoker -- 0.25 packs/day for 50 years    Types: Cigarettes    Start date: 04[birth year]10/1945  . Smokeless tobacco: Never Used     Comment: 1/3 ppd; stopped for @ least 8 years  . Alcohol Use: No   OB History    Gravida Para Term Preterm AB TAB SAB Ectopic Multiple Living   4 4 4       4      Review of Systems  Unable to perform ROS: Mental status change  Gastrointestinal: Positive for abdominal pain.      Allergies  Iohexol; Ivp dye; Naprosyn; Red blood cells; Verapamil; Vicodin; and Sulfa antibiotics  Home Medications   Prior to Admission medications   Medication Sig Start Date End Date Taking? Authorizing Provider  acetaminophen (TYLENOL) 325 MG tablet Take 650 mg by mouth every 6 (six) hours as needed. For pain   Yes Historical Provider, MD  aspirin 81 MG chewable tablet Chew 2 tablets (162 mg total) by mouth daily. 04/04/14  Yes Elliot Cousin, MD  azaTHIOprine (IMURAN) 50 MG tablet TAKE 1 AND 1/2 TABLETS BY MOUTH ONCE DAILY 03/06/15  Yes Len Blalock, NP  bisacodyl (DULCOLAX) 10 MG suppository Place 1 suppository (10 mg total) rectally as needed for moderate constipation. 05/12/15  Yes Malissa Hippo, MD  Cholecalciferol (VITAMIN D) 2000 UNITS tablet Take 2,000 Units by mouth daily.   Yes Historical Provider, MD  DEXILANT 60 MG capsule TAKE 1 CAPSULE BY MOUTH EVERY OTHER DAY 10/21/14  Yes Malissa Hippo, MD  diltiazem (TIAZAC) 360 MG 24 hr capsule Take 1 capsule (360 mg total) by mouth daily. 01/06/14  Yes Hollice Espy, MD  doxycycline (VIBRA-TABS) 100 MG tablet Take 100 mg by mouth 2 (two) times daily. Starting 06/27/2015 - 07/06/2015.   Yes Historical Provider, MD  folic acid (FOLVITE) 400 MCG tablet Take 400 mcg by mouth daily.   Yes Historical Provider, MD  furosemide (LASIX) 20 MG tablet Take 20 mg by mouth daily as needed for fluid (Give is weight is up 3 lbs.).   Yes Historical Provider, MD  lidocaine-prilocaine (EMLA) cream Apply 1 application topically  as needed (pain).  06/20/14  Yes Historical Provider, MD  metoprolol (LOPRESSOR) 50 MG tablet Take 50 mg by mouth 2 (two) times daily.   Yes Historical Provider, MD  polyethylene glycol (MIRALAX / GLYCOLAX) packet Take 17 g by mouth daily. 03/17/15  Yes Marinda Elk, MD  predniSONE (DELTASONE) 5 MG tablet Take 7.5 mg by mouth daily with breakfast.  03/13/15  Yes Historical Provider, MD  Travoprost, BAK Free, (TRAVATAN) 0.004 % SOLN ophthalmic solution 1 drop at bedtime.   Yes Historical Provider, MD  ursodiol (ACTIGALL) 300 MG capsule  Take 300 mg by mouth 2 (two) times daily.   Yes Historical Provider, MD   BP 172/148 mmHg  Pulse 111  Temp(Src) 97.5 F (36.4 C) (Oral)  Resp 18  Ht 5\' 5"  (1.651 m)  Wt 152 lb (68.947 kg)  BMI 25.29 kg/m2  SpO2 99% Physical Exam  1905: Physical examination:  Nursing notes reviewed; Vital signs and O2 SAT reviewed;  Constitutional: Well developed, Well nourished, Uncomfortable appearing;; Head:  Normocephalic, atraumatic; Eyes: EOMI, PERRL, No scleral icterus; ENMT: Mouth and pharynx normal, Mucous membranes dry; Neck: Supple, Full range of motion, No lymphadenopathy; Cardiovascular: Irregular irregular rate and rhythm, No gallop; Respiratory: Breath sounds clear & equal bilaterally, No wheezes.  Speaking full sentences with ease, Normal respiratory effort/excursion; Chest: Nontender, Movement normal; Abdomen: Soft, +diffuse tenderness to palp. No rebound or guarding. Nondistended, Normal bowel sounds. Rectal exam performed w/permission of pt and ED Tech chaperone present.  Anal tone normal.  Non-tender, soft brown stool in rectal vault, heme positive.  No fissures, no external hemorrhoids, no palp masses.;; Genitourinary: No CVA tenderness; Extremities: Pulses normal, No tenderness, +2 pedal edema bilat. No calf asymmetry.; Neuro: Awake, alert, vague historian. No facial droop. Speech clear. Moves all extremities spontaneously without apparent gross focal motor  deficits.; Skin: Color normal, Warm, Dry.   ED Course  Procedures (including critical care time) Labs Review  Imaging Review  I have personally reviewed and evaluated these images and lab results as part of my medical decision-making.   EKG Interpretation   Date/Time:  Wednesday July 01 2015 19:22:03 EDT Ventricular Rate:  104 PR Interval:    QRS Duration: 93 QT Interval:  371 QTC Calculation: 488 R Axis:   -16 Text Interpretation:  Atrial fibrillation Borderline left axis deviation  Low voltage, precordial leads Anteroseptal infarct, old Borderline  repolarization abnormality When compared with ECG of 06/18/2015 No  significant change was found Confirmed by [facility]  MD, [facility] [phone]) on  07/01/2015 7:28:36 PM      MDM  MDM Reviewed: previous chart, nursing note and vitals Reviewed previous: labs and ECG Interpretation: labs, ECG, x-ray and CT scan     Results for orders placed or performed during the hospital encounter of 07/01/15  Lactic acid, plasma  Result Value Ref Range   Lactic Acid, Venous 2.8 (HH) 0.5 - 1.9 mmol/L  Troponin I  Result Value Ref Range   Troponin I 0.04 (HH) <0.03 ng/mL  Comprehensive metabolic panel  Result Value Ref Range   Sodium 136 135 - 145 mmol/L   Potassium 4.7 3.5 - 5.1 mmol/L   Chloride 105 101 - 111 mmol/L   CO2 22 22 - 32 mmol/L   Glucose, Bld 115 (H) 65 - 99 mg/dL   BUN 51 (H) 6 - 20 mg/dL   Creatinine, Ser 07/03/15 (H) 0.44 - 1.00 mg/dL   Calcium 9.4 8.9 - 3.78 mg/dL   Total Protein 5.2 (L) 6.5 - 8.1 g/dL   Albumin 2.3 (L) 3.5 - 5.0 g/dL   AST 58.8 (H) 15 - 41 U/L   ALT 30 14 - 54 U/L   Alkaline Phosphatase 59 38 - 126 U/L   Total Bilirubin 2.9 (H) 0.3 - 1.2 mg/dL   GFR calc non Af Amer 26 (L) >60 mL/min   GFR calc Af Amer 30 (L) >60 mL/min   Anion gap 9 5 - 15  Lipase, blood  Result Value Ref Range   Lipase 28 11 - 51 U/L  CBC with Differential  Result Value Ref Range   WBC 5.2 4.0 - 10.5 K/uL   RBC 4.03 3.87 -  5.11 MIL/uL   Hemoglobin 11.7 (L) 12.0 - 15.0 g/dL   HCT 16.1 (L) 09.6 - 04.5 %   MCV 84.1 78.0 - 100.0 fL   MCH 29.0 26.0 - 34.0 pg   MCHC 34.5 30.0 - 36.0 g/dL   RDW 40.9 (H) 81.1 - 91.4 %   Platelets 35 (L) 150 - 400 K/uL   Neutrophils Relative % 88 %   Neutro Abs 4.6 1.7 - 7.7 K/uL   Lymphocytes Relative 3 %   Lymphs Abs 0.2 (L) 0.7 - 4.0 K/uL   Monocytes Relative 9 %   Monocytes Absolute 0.4 0.1 - 1.0 K/uL   Eosinophils Relative 0 %   Eosinophils Absolute 0.0 0.0 - 0.7 K/uL   Basophils Relative 0 %   Basophils Absolute 0.0 0.0 - 0.1 K/uL   WBC Morphology RARE NRBCs    RBC Morphology TEARDROP CELLS    Smear Review PLATELETS APPEAR DECREASED   POC occult blood, ED  Result Value Ref Range   Fecal Occult Bld POSITIVE (A) NEGATIVE  Type and screen  Result Value Ref Range   ABO/RH(D) A POS    Antibody Screen NEG    Sample Expiration 07/04/2015     Dg Abd Acute W/chest 07/01/2015  CLINICAL DATA:  Heme-positive stool, nausea vomiting. EXAM: DG ABDOMEN ACUTE W/ 1V CHEST COMPARISON:  Chest x-ray June 20, 2015 FINDINGS: There is patchy consolidation of the lateral right lung base. There is mild interstitial edema. The heart size is enlarged. There is a small right pleural effusion. There is no evidence of bowel obstruction or free air. Degenerative joint changes of the spine are noted. IMPRESSION: Congestive heart failure. Patchy consolidation of the lateral right lung base suspicious for pneumonia. Small right pleural effusion. No evidence of bowel obstruction or free air. Electronically Signed   By: Sherian Rein M.D.   On: 07/01/2015 20:59   Ct Renal Stone Study 07/01/2015  CLINICAL DATA:  Abdomen pain today.  Blood in the stool. EXAM: CT ABDOMEN AND PELVIS WITHOUT CONTRAST TECHNIQUE: Multidetector CT imaging of the abdomen and pelvis was performed following the standard protocol without IV contrast. COMPARISON:  March 31, 2014 FINDINGS: Lower chest: There are small bilateral pleural  effusions. Consolidation of the right middle lobe is identified. There is mild atelectasis of the posterior bilateral lower lobes. The heart size is enlarged. Hepatobiliary: There is a calcified granuloma within the liver. No focal mass is identified in the liver on this noncontrast exam. The gallbladder is normal. Pancreas: No mass or inflammatory process identified on this un-enhanced exam. Spleen: Within normal limits in size. Adrenals/Urinary Tract: There is no hydronephrosis bilaterally. There are left kidney cysts, largest measures 4.3 x 3.9 cm in the midpole. There is no right kidney mass. There is a 2 mm vascular calcification versus non obstructing calcification in the midpole left kidney. The adrenal glands are normal. The bladder is normal. Stomach/Bowel: No evidence of obstruction, inflammatory process, or abnormal fluid collections. There is diverticulosis of colon without diverticulitis. The appendix is normal. Vascular/Lymphatic: Calcified lymph nodes are identified in the porta hepatis. No pathologically enlarged lymph nodes. No evidence of abdominal aortic aneurysm. There is atherosclerosis of the abdominal aorta. Reproductive: No mass or other significant abnormality. Other: Ascites is identified in the abdomen and pelvis. Musculoskeletal: Degenerative joint changes of the spine are identified. IMPRESSION: No bowel obstruction or diverticulitis. Bilateral  pleural effusions with consolidation of the right middle lobe suspicious for pneumonia. Small amount of ascites in the abdomen and pelvis. Electronically Signed   By: Sherian Rein M.D.   On: 07/01/2015 21:08    Results for AVALON, ENGLEHART (MRN [id number]) as of 07/01/2015 21:46  Ref. Range 06/19/2015 03:47 06/20/2015 07:10 06/27/2015 07:45 06/29/2015 06:00 06/30/2015 07:00 07/01/2015 07:40 07/01/2015 19:50  BUN Latest Ref Range: 6-20 mg/dL 21 (H) 19 26 (H) 38 (H) 48 (H) 55 (H) 51 (H)  Creatinine Latest Ref Range: 0.44-1.00 mg/dL 4.03 (H) 7.09 (H)  6.43 1.65 (H) 2.15 (H) 2.07 (H) 1.73 (H)  Alkaline Phosphatase Latest Ref Range: 38-126 U/L 63 64 63   60 59  AST Latest Ref Range: 15-41 U/L 202 (H) 227 (H) 212 (H)   214 (H) 212 (H)  ALT Latest Ref Range: 14-54 U/L 20 22 21   27 30   Total Protein Latest Ref Range: 6.5-8.1 g/dL 4.8 (L) 5.4 (L) 5.2 (L)   5.3 (L) 5.2 (L)  Bilirubin, Direct Latest Ref Range: 0.1-0.5 mg/dL 0.6 (H)     1.4 (H)   Indirect Bilirubin Latest Ref Range: 0.3-0.9 mg/dL 0.9     1.6 (H)   Total Bilirubin Latest Ref Range: 0.3-1.2 mg/dL 1.5 (H) 1.9 (H) 2.6 (H)   3.0 (H) 2.9 (H)    2140:  IV protonix bolus/gtt started for GI bleeding (has hx of AVM and gastric ulcers). LFT's and BUN/Cr per baseline. Judicious IVF given for mildly elevated lactic acid. IV abx started for HCAP. Pt remains afebrile. T/C to [facility]. Sharl Ma, case discussed, including:  HPI, pertinent PM/SHx, VS/PE, dx testing, ED course and treatment:  Agreeable to admit, requests to write temporary orders, obtain tele bed to team [facility].   Samuel Jester, DO 07/05/15 1428

## 2015-07-02 ENCOUNTER — Encounter: Payer: Self-pay | Admitting: Internal Medicine

## 2015-07-02 DIAGNOSIS — R410 Disorientation, unspecified: Secondary | ICD-10-CM | POA: Diagnosis present

## 2015-07-02 DIAGNOSIS — D696 Thrombocytopenia, unspecified: Secondary | ICD-10-CM | POA: Diagnosis present

## 2015-07-02 DIAGNOSIS — Y95 Nosocomial condition: Secondary | ICD-10-CM | POA: Diagnosis present

## 2015-07-02 DIAGNOSIS — J189 Pneumonia, unspecified organism: Secondary | ICD-10-CM | POA: Diagnosis present

## 2015-07-02 DIAGNOSIS — N189 Chronic kidney disease, unspecified: Secondary | ICD-10-CM | POA: Insufficient documentation

## 2015-07-02 DIAGNOSIS — N183 Chronic kidney disease, stage 3 (moderate): Secondary | ICD-10-CM | POA: Diagnosis not present

## 2015-07-02 DIAGNOSIS — M069 Rheumatoid arthritis, unspecified: Secondary | ICD-10-CM | POA: Diagnosis present

## 2015-07-02 DIAGNOSIS — Z8262 Family history of osteoporosis: Secondary | ICD-10-CM | POA: Diagnosis not present

## 2015-07-02 DIAGNOSIS — R1013 Epigastric pain: Secondary | ICD-10-CM | POA: Diagnosis not present

## 2015-07-02 DIAGNOSIS — Z825 Family history of asthma and other chronic lower respiratory diseases: Secondary | ICD-10-CM | POA: Diagnosis not present

## 2015-07-02 DIAGNOSIS — E2740 Unspecified adrenocortical insufficiency: Secondary | ICD-10-CM | POA: Diagnosis not present

## 2015-07-02 DIAGNOSIS — R109 Unspecified abdominal pain: Secondary | ICD-10-CM | POA: Insufficient documentation

## 2015-07-02 DIAGNOSIS — K922 Gastrointestinal hemorrhage, unspecified: Secondary | ICD-10-CM | POA: Diagnosis not present

## 2015-07-02 DIAGNOSIS — G629 Polyneuropathy, unspecified: Secondary | ICD-10-CM | POA: Diagnosis present

## 2015-07-02 DIAGNOSIS — R7989 Other specified abnormal findings of blood chemistry: Secondary | ICD-10-CM | POA: Diagnosis not present

## 2015-07-02 DIAGNOSIS — K920 Hematemesis: Secondary | ICD-10-CM | POA: Diagnosis present

## 2015-07-02 DIAGNOSIS — Z8673 Personal history of transient ischemic attack (TIA), and cerebral infarction without residual deficits: Secondary | ICD-10-CM | POA: Diagnosis not present

## 2015-07-02 DIAGNOSIS — Q2733 Arteriovenous malformation of digestive system vessel: Secondary | ICD-10-CM | POA: Diagnosis not present

## 2015-07-02 DIAGNOSIS — Z8249 Family history of ischemic heart disease and other diseases of the circulatory system: Secondary | ICD-10-CM | POA: Diagnosis not present

## 2015-07-02 DIAGNOSIS — D509 Iron deficiency anemia, unspecified: Secondary | ICD-10-CM | POA: Diagnosis present

## 2015-07-02 DIAGNOSIS — I129 Hypertensive chronic kidney disease with stage 1 through stage 4 chronic kidney disease, or unspecified chronic kidney disease: Secondary | ICD-10-CM | POA: Diagnosis present

## 2015-07-02 DIAGNOSIS — F17210 Nicotine dependence, cigarettes, uncomplicated: Secondary | ICD-10-CM | POA: Diagnosis present

## 2015-07-02 DIAGNOSIS — Z8701 Personal history of pneumonia (recurrent): Secondary | ICD-10-CM | POA: Diagnosis not present

## 2015-07-02 DIAGNOSIS — Z7982 Long term (current) use of aspirin: Secondary | ICD-10-CM | POA: Diagnosis not present

## 2015-07-02 DIAGNOSIS — M109 Gout, unspecified: Secondary | ICD-10-CM | POA: Diagnosis present

## 2015-07-02 DIAGNOSIS — I480 Paroxysmal atrial fibrillation: Secondary | ICD-10-CM | POA: Diagnosis present

## 2015-07-02 DIAGNOSIS — N179 Acute kidney failure, unspecified: Secondary | ICD-10-CM | POA: Diagnosis not present

## 2015-07-02 DIAGNOSIS — Z79899 Other long term (current) drug therapy: Secondary | ICD-10-CM | POA: Diagnosis not present

## 2015-07-02 DIAGNOSIS — D631 Anemia in chronic kidney disease: Secondary | ICD-10-CM | POA: Diagnosis present

## 2015-07-02 DIAGNOSIS — I248 Other forms of acute ischemic heart disease: Secondary | ICD-10-CM | POA: Diagnosis present

## 2015-07-02 DIAGNOSIS — Z823 Family history of stroke: Secondary | ICD-10-CM | POA: Diagnosis not present

## 2015-07-02 LAB — COMPREHENSIVE METABOLIC PANEL
ALT: 30 U/L (ref 14–54)
AST: 222 U/L — AB (ref 15–41)
Albumin: 2.4 g/dL — ABNORMAL LOW (ref 3.5–5.0)
Alkaline Phosphatase: 57 U/L (ref 38–126)
Anion gap: 6 (ref 5–15)
BILIRUBIN TOTAL: 2.7 mg/dL — AB (ref 0.3–1.2)
BUN: 45 mg/dL — AB (ref 6–20)
CO2: 23 mmol/L (ref 22–32)
CREATININE: 1.34 mg/dL — AB (ref 0.44–1.00)
Calcium: 9.5 mg/dL (ref 8.9–10.3)
Chloride: 107 mmol/L (ref 101–111)
GFR calc Af Amer: 42 mL/min — ABNORMAL LOW (ref >60)
GFR, EST NON AFRICAN AMERICAN: 36 mL/min — AB (ref >60)
Glucose, Bld: 99 mg/dL (ref 65–99)
POTASSIUM: 4.4 mmol/L (ref 3.5–5.1)
Sodium: 136 mmol/L (ref 135–145)
TOTAL PROTEIN: 5.2 g/dL — AB (ref 6.5–8.1)

## 2015-07-02 LAB — HEMOGLOBIN AND HEMATOCRIT, BLOOD
HCT: 32.2 % — ABNORMAL LOW (ref 36.0–46.0)
HEMOGLOBIN: 11.2 g/dL — AB (ref 12.0–15.0)

## 2015-07-02 LAB — CBC
HEMATOCRIT: 31.3 % — AB (ref 36.0–46.0)
Hemoglobin: 11 g/dL — ABNORMAL LOW (ref 12.0–15.0)
MCH: 29.3 pg (ref 26.0–34.0)
MCHC: 35.1 g/dL (ref 30.0–36.0)
MCV: 83.5 fL (ref 78.0–100.0)
Platelets: 43 10*3/uL — ABNORMAL LOW (ref 150–400)
RBC: 3.75 MIL/uL — ABNORMAL LOW (ref 3.87–5.11)
RDW: 24.4 % — AB (ref 11.5–15.5)
WBC: 3.3 10*3/uL — AB (ref 4.0–10.5)

## 2015-07-02 LAB — MRSA PCR SCREENING: MRSA by PCR: NEGATIVE

## 2015-07-02 LAB — LACTIC ACID, PLASMA: LACTIC ACID, VENOUS: 2 mmol/L — AB (ref 0.5–1.9)

## 2015-07-02 LAB — TROPONIN I
TROPONIN I: 0.05 ng/mL — AB (ref <0.03)
Troponin I: 0.07 ng/mL (ref <0.03)
Troponin I: 0.06 ng/mL (ref <0.03)

## 2015-07-02 LAB — OCCULT BLOOD X 1 CARD TO LAB, STOOL: FECAL OCCULT BLD: POSITIVE — AB

## 2015-07-02 MED ORDER — PANTOPRAZOLE SODIUM 40 MG IV SOLR
INTRAVENOUS | Status: AC
Start: 2015-07-02 — End: 2015-07-02
  Filled 2015-07-02: qty 80

## 2015-07-02 MED ORDER — DILTIAZEM HCL ER COATED BEADS 180 MG PO CP24
360.0000 mg | ORAL_CAPSULE | Freq: Every day | ORAL | Status: DC
  Administered 2015-07-02 – 2015-07-04 (×3): 360 mg via ORAL
  Filled 2015-07-02 (×3): qty 2

## 2015-07-02 MED ORDER — URSODIOL 300 MG PO CAPS
ORAL_CAPSULE | ORAL | Status: AC
  Filled 2015-07-02: qty 1

## 2015-07-02 MED ORDER — VANCOMYCIN HCL IN DEXTROSE 750-5 MG/150ML-% IV SOLN
750.0000 mg | INTRAVENOUS | Status: DC
  Administered 2015-07-02: 750 mg via INTRAVENOUS
  Filled 2015-07-02: qty 150

## 2015-07-02 MED ORDER — DEXTROSE 5 % IV SOLN
1.0000 g | INTRAVENOUS | Status: DC
  Administered 2015-07-02 – 2015-07-03 (×2): 1 g via INTRAVENOUS
  Filled 2015-07-02 (×4): qty 1

## 2015-07-02 NOTE — Progress Notes (Signed)
CRITICAL VALUE ALERT  Critical value received:  Troponin 0.06  Date of notification:  07/02/2015   Time of notification:  1310  Critical value read back:Yes.    Nurse who received alert:  Pearlie Oyster RN  MD notified (1st page):  Dr Susie Cassette  Time of first page:  1315, verbally notified Dr Susie Cassette on the floor

## 2015-07-02 NOTE — Progress Notes (Signed)
Pharmacy Antibiotic Note  Eileen Santana is a 82 y.o. female admitted on 07/01/2015 with pneumonia.  Pharmacy has been consulted for [facility] AND CEFEPIME dosing.  Plan: Vancomycin 750mg  IV q24hrs Check trough at steady state Cefepime 1gm IV q24hrs Monitor labs, renal fxn, progress and c/s  Height: 5\' 5"  (165.1 cm) Weight: 151 lb 8 oz (68.72 kg) IBW/kg (Calculated) : 57  Temp (24hrs), Avg:97.8 F (36.6 C), Min:97.5 F (36.4 C), Max:98.1 F (36.7 C)   Recent Labs Lab 06/27/15 0745 06/29/15 0600 06/30/15 0700 07/01/15 0740 07/01/15 1950 07/02/15 0014  WBC 2.3* 4.0  --   --  5.2  --   CREATININE 1.00 1.65* 2.15* 2.07* 1.73*  --   LATICACIDVEN  --   --   --   --  2.8* 2.0*    Estimated Creatinine Clearance: 24.4 mL/min (by C-G formula based on Cr of 1.73).    Allergies  Allergen Reactions  . Iohexol Swelling    IV Dye   . Ivp Dye [Iodinated Diagnostic Agents] Swelling    Hives  . Naprosyn [Naproxen] Hives and Itching  . Red Blood Cells Swelling and Dermatitis    With blood transfusion 2012  . Verapamil     Heart block (2nd degree) Takes Cardizem   . Vicodin [Hydrocodone-Acetaminophen] Hives and Itching  . Sulfa Antibiotics Itching and Rash   Antimicrobials this admission: Vancomycin 6/28 >>  Cefepime 6/28 >>   Dose adjustments this admission: N/a  Recent Results (from the past 240 hour(s))  Culture, blood (Routine X 2) w Reflex to ID Panel     Status: None (Preliminary result)   Collection Time: 07/01/15 11:11 PM  Result Value Ref Range Status   Specimen Description BLOOD LEFT ARM  Final   Special Requests BOTTLES DRAWN AEROBIC AND ANAEROBIC 4CC EACH  Final   Culture PENDING  Incomplete   Report Status PENDING  Incomplete  Culture, blood (Routine X 2) w Reflex to ID Panel     Status: None (Preliminary result)   Collection Time: 07/02/15 12:14 AM  Result Value Ref Range Status   Specimen Description BLOOD LEFT ANTECUBITAL  Final   Special Requests  BOTTLES DRAWN AEROBIC ONLY 4CC  Final   Culture PENDING  Incomplete   Report Status PENDING  Incomplete   Thank you for allowing pharmacy to be a part of this patient's care.  07/03/15, PharmD Clinical Pharmacist Pager:  [phone] 07/02/2015 7:44 AM

## 2015-07-02 NOTE — Clinical Social Work Note (Signed)
Clinical Social Work Assessment  Patient Details  Name: Eileen Santana MRN: [id number] Date of Birth: [birth year]  Date of referral:  07/02/15               Reason for consult:  Facility Placement (From [facility])                Permission sought to share information with:    Permission granted to share information::     Name::     Daugter, Morning Halberg, was at bedside.   Agency::     Relationship::     Contact Information:     Housing/Transportation Living arrangements for the past 2 months:  Single Family Home, Skilled Nursing Facility Source of Information:  Patient, Adult Children Patient Interpreter Needed:  None Criminal Activity/Legal Involvement Pertinent to Current Situation/Hospitalization:  No - Comment as needed Significant Relationships:  Adult Children Lives with:  Adult Children Do you feel safe going back to the place where you live?  Yes Need for family participation in patient care:  Yes (Comment)  Care giving concerns:  None identified.    Social Worker assessment / plan:  Patient has been a resident at [facility] for approximately three weeks for rehab purposes. Facility staff assists with ADLs.  At baseline lives with two of her daughters, [name] and [name]. She stated that a baseline she uses a walker, cane and wheelchair. Patient advised that she uses a walker most often. She indicated that her ADLs are assisted by her daughters when at home. Patient advised that she plans on returning to [facility] to complete her rehab. CSW spoke with Tami at [facility]. She confirmed patient's statements. She advised that patient has good family support. Tami advised that patient would not need a new FL2.   Employment status:    Insurance information:  Medicare PT Recommendations:  Skilled Nursing Facility Information / Referral to community resources:  Skilled Nursing Facility  Patient/Family's Response to care: Patient and family are agreeable to patient returning to [facility] for rehab.    Patient/Family's Understanding of and Emotional Response to Diagnosis, Current Treatment, and Prognosis:  Patient and family understand patient's diagnosis, treatment and prognosis.   Emotional Assessment Appearance:  Appears stated age Attitude/Demeanor/Rapport:   (Cooperative and pleasant) Affect (typically observed):  Accepting Orientation:  Oriented to Self, Oriented to Place Alcohol / Substance use:  Not Applicable Psych involvement (Current and /or in the community):  No (Comment)  Discharge Needs  Concerns to be addressed:   (Return to facility) Readmission within the last 30 days:  Yes Current discharge risk:  None Barriers to Discharge:  No Barriers Identified   Annice Needy, LCSW 07/02/2015, 2:24 PM

## 2015-07-02 NOTE — Care Management Note (Signed)
Case Management Note  Patient Details  Name: Barbette Mcglaun MRN: [id number] Date of Birth: [birth year]  Subjective/Objective:                  Pt admitted with HCAP. Pt is from [facility] for STR. Family anticipates return to [facility]. CSW is aware and will see pt and make arrangments for return to facility if appropriate   Action/Plan: No CM needs anticipated.  Expected Discharge Date:    07/03/2015              Expected Discharge Plan:  Skilled Nursing Facility  In-House Referral:  Clinical Social Work  Discharge planning Services  CM Consult  Post Acute Care Choice:  NA Choice offered to:  NA  DME Arranged:    DME Agency:     HH Arranged:    HH Agency:     Status of Service:  Completed, signed off  If discussed at [employer], dates discussed:    Additional Comments:  Malcolm Metro, RN 07/02/2015, 9:10 AM

## 2015-07-02 NOTE — Care Management Obs Status (Signed)
MEDICARE OBSERVATION STATUS NOTIFICATION   Patient Details  Name: Eileen Santana MRN: [id number] Date of Birth: 25-Mar-1930   Medicare Observation Status Notification Given:  Yes    Malcolm Metro, RN 07/02/2015, 9:09 AM

## 2015-07-02 NOTE — Progress Notes (Addendum)
[facility] PROGRESS NOTE  Eileen Santana ZDG:[id number] DOB: May 23, 1930 DOA: 07/01/2015   PCP: [name], MD     Assessment/Plan: Active Problems:   HTN (hypertension), benign   Thrombocytopenia (HCC)   Rheumatoid arthritis (HCC)   Gastric AVM   HCAP (healthcare-associated pneumonia)   Elevated troponin I level   Abdominal pain   CRF (chronic renal failure)   82 y.o. female, Who was brought to the hospital for abdominal pain. Patient's daughter who is a Engineer, civil (consulting) provides most of the history. As per daughter, patient has been at rehabilitation facility and was given Percocet and Xanax 5 days ago after the patient became very confused and drowsy. This medication was stopped and this morning patient was back to her usual self. Patient complained of abdominal pain around 1 PM in the afternoon, and after that had one episode of coffee-ground emesis as per daughter. She has a history of anemia, gastric AVMs requiring multiple blood transfusions in the past. She hasn't had any blood transfusion in the past 6 months. She also has a history of chronic thrombocytopenia.  Assessment and plan 1. Healthcare associated pneumonia- chest x-ray showed consolidation of the right middle lobe suspicious for pneumonia, continue with vancomycin and cefepime per pharmacy consultation. Follow blood cultures. 2. Vomiting ? GI bleed- patient had one episode of ? Coffee-ground emesis as per daughter. Will obtain serial H&H. Serial hemoglobin stable. Continue with protonix IV. Previous history of gastric AVMs. Last EGD 2012. If hemoglobin drops will consider GI consultation 3. Elevated troponin- patient has mild elevation of troponin 0.04>0.07,0.06, likely from demand ischemia. We'll follow serial troponin, unable to get aspirin due to severe thrombocytopenia and suspected GI bleeding 4. Chronic thrombocytopenia- stable, platelet count 35. Will follow CBC in a.m. 5. Atrial fibrillation- continue  Cardizem, metoprolol. Patient not on anticoagulation due to GI bleed. Also hold aspirin at this time due to above. 6. History of rheumatoid arthritis- continue prednisone, Imuran. 7. Peripheral neuropathy- restart gabapentin 100 mg daily at bedtime    DVT prophylaxsis SCDs  Code Status:  Full code    Family Communication: Discussed in detail with the patient, all imaging results, lab results explained to the patient   Disposition Plan:  Anticipate discharge in one to 2 days       Consultants:  None  Procedures:  None  Antibiotics: Anti-infectives    Start     Dose/Rate Route Frequency Ordered Stop   07/02/15 2100  ceFEPIme (MAXIPIME) 1 g in dextrose 5 % 50 mL IVPB     1 g 100 mL/hr over 30 Minutes Intravenous Every 24 hours 07/02/15 0741     07/02/15 2000  vancomycin (VANCOCIN) IVPB 750 mg/150 ml premix     750 mg 150 mL/hr over 60 Minutes Intravenous Every 24 hours 07/02/15 0740       HPI/Subjective: Patient is a poor historian but states that she had 2 episodes of coffee-ground emesis yesterday, none today  Objective: Filed Vitals:   07/01/15 2200 07/01/15 2324 07/02/15 0520 07/02/15 0600  BP: 116/76 117/63 163/63 162/64  Pulse: 112 112 84 106  Temp:  97.9 F (36.6 C) 98.1 F (36.7 C) 97.6 F (36.4 C)  TempSrc:  Axillary Oral Oral  Resp: 22 16 20 18   Height:  5\' 5"  (1.651 m)    Weight:  68.72 kg (151 lb 8 oz)    SpO2: 98% 97% 100% 99%    Intake/Output Summary (Last 24 hours) at 07/02/15  1116 Last data filed at 07/02/15 0900  Gross per 24 hour  Intake      0 ml  Output    600 ml  Net   -600 ml    Exam:  Examination:  General exam: Appears calm and comfortable  Respiratory system: Clear to auscultation. Respiratory effort normal. Cardiovascular system: S1 & S2 heard, RRR. No JVD, murmurs, rubs, gallops or clicks. No pedal edema. Gastrointestinal system: Abdomen is nondistended, soft and nontender. No organomegaly or masses felt. Normal bowel  sounds heard. Central nervous system: Alert and oriented. No focal neurological deficits. Extremities: Symmetric 5 x 5 power. Skin: No rashes, lesions or ulcers Psychiatry: Judgement and insight appear normal. Mood & affect appropriate.     Data Reviewed: I have personally reviewed following labs and imaging studies  Micro Results Recent Results (from the past 240 hour(s))  Culture, blood (Routine X 2) w Reflex to ID Panel     Status: None (Preliminary result)   Collection Time: 07/01/15 11:11 PM  Result Value Ref Range Status   Specimen Description BLOOD LEFT ARM  Final   Special Requests BOTTLES DRAWN AEROBIC AND ANAEROBIC 4CC EACH  Final   Culture PENDING  Incomplete   Report Status PENDING  Incomplete  MRSA PCR Screening     Status: None   Collection Time: 07/01/15 11:35 PM  Result Value Ref Range Status   MRSA by PCR NEGATIVE NEGATIVE Final    Comment:        The GeneXpert MRSA Assay (FDA approved for NASAL specimens only), is one component of a comprehensive MRSA colonization surveillance program. It is not intended to diagnose MRSA infection nor to guide or monitor treatment for MRSA infections.   Culture, blood (Routine X 2) w Reflex to ID Panel     Status: None (Preliminary result)   Collection Time: 07/02/15 12:14 AM  Result Value Ref Range Status   Specimen Description BLOOD LEFT ANTECUBITAL  Final   Special Requests BOTTLES DRAWN AEROBIC ONLY 4CC  Final   Culture PENDING  Incomplete   Report Status PENDING  Incomplete    Radiology Reports Dg Chest 2 View  06/18/2015  CLINICAL DATA:  Acute onset of bilateral lower extremity weakness and swelling. Initial encounter. EXAM: CHEST  2 VIEW COMPARISON:  Chest radiograph performed 03/15/2015 FINDINGS: The lungs are well-aerated. Pulmonary vascularity is at the upper limits of normal. There is no evidence of focal opacification, pleural effusion or pneumothorax. The heart is enlarged.  No acute osseous abnormalities  are seen. IMPRESSION: Cardiomegaly.  Lungs remain grossly clear. Electronically Signed   By: Roanna Raider M.D.   On: 06/18/2015 19:22   Dg Lumbar Spine 2-3 Views  06/19/2015  CLINICAL DATA:  Low back pain EXAM: LUMBAR SPINE - 2-3 VIEW COMPARISON:  MRI 03/15/2015 and x-ray 1/28/ 17 FINDINGS: Three views of the lumbar spine submitted. No acute fracture or subluxation. Again noted significant disc space flattening with vacuum disc phenomenon at L4-L5 and L5-S1 level. Facet degenerative changes L5 level. Atherosclerotic calcifications of abdominal aorta and iliac arteries are noted. Mild lower thoracic levoscoliosis. IMPRESSION: No acute fracture or subluxation. Again noted osteoarthritic changes at L4-L5 and L5-S1 level. Electronically Signed   By: Natasha Mead M.D.   On: 06/19/2015 12:13   Dg Pelvis 1-2 Views  06/19/2015  CLINICAL DATA:  Low back pain, leg pain EXAM: PELVIS - 1-2 VIEW COMPARISON:  CT scan 03/31/2014 FINDINGS: Single frontal view of the pelvis submitted. No acute fracture or  subluxation. Degenerative changes left SI joint again noted. Degenerative changes pubic symphysis. Mild degenerative changes bilateral hip joints with superior acetabular spurring. IMPRESSION: No acute fracture or subluxation. Osteoarthritic changes as described above. Electronically Signed   By: Natasha Mead M.D.   On: 06/19/2015 12:19   Dg Sacrum/coccyx  06/19/2015  CLINICAL DATA:  Low back pain leg pain, no known injury EXAM: SACRUM AND COCCYX - 2+ VIEW COMPARISON:  03/31/2014 FINDINGS: Three views of the sacrum and coccyx submitted. No acute fracture or subluxation. There is diffuse osteopenia. Significant osteoarthritic changes left SI joint. Degenerative changes pubic symphysis. There is a healed stress fracture in S3 level sacrum best seen on lateral view with alignment preserved. IMPRESSION: No acute fracture or subluxation. There is diffuse osteopenia. Significant osteoarthritic changes left SI joint. Degenerative  changes pubic symphysis. There is a healed stress fracture in S3 level sacrum best seen on lateral view with alignment preserved. Electronically Signed   By: Natasha Mead M.D.   On: 06/19/2015 12:18   Ct Head Wo Contrast  06/18/2015  CLINICAL DATA:  Bilateral leg weakness. EXAM: CT HEAD WITHOUT CONTRAST TECHNIQUE: Contiguous axial images were obtained from the base of the skull through the vertex without intravenous contrast. COMPARISON:  03/13/2015 FINDINGS: Brain: No evidence of acute infarction, hemorrhage, extra-axial collection, ventriculomegaly, or mass effect. There is moderate brain parenchymal atrophy and mild bilateral deep white matter microvascular ischemia. Vascular: Vascular calcifications at the skullbase. Skull: Negative for fracture or focal lesion. Sinuses/Orbits: No acute findings. Other: None. IMPRESSION: No acute intracranial abnormality. Atrophy, chronic microvascular disease. Electronically Signed   By: Ted Mcalpine M.D.   On: 06/18/2015 19:43   Dg Chest Port 1 View  06/20/2015  CLINICAL DATA:  Cough EXAM: PORTABLE CHEST 1 VIEW COMPARISON:  06/18/2015 chest radiograph. FINDINGS: Stable cardiomediastinal silhouette with moderate cardiomegaly. No pneumothorax. Probable trace bilateral pleural effusions. Cephalization of the pulmonary vasculature without overt pulmonary edema. No acute consolidative airspace disease. IMPRESSION: Stable moderate cardiomegaly. Cephalization of the pulmonary vasculature without overt pulmonary edema. No acute consolidative airspace disease. Electronically Signed   By: Delbert Phenix M.D.   On: 06/20/2015 16:37   Dg Abd Acute W/chest  07/01/2015  CLINICAL DATA:  Heme-positive stool, nausea vomiting. EXAM: DG ABDOMEN ACUTE W/ 1V CHEST COMPARISON:  Chest x-ray June 20, 2015 FINDINGS: There is patchy consolidation of the lateral right lung base. There is mild interstitial edema. The heart size is enlarged. There is a small right pleural effusion. There is no  evidence of bowel obstruction or free air. Degenerative joint changes of the spine are noted. IMPRESSION: Congestive heart failure. Patchy consolidation of the lateral right lung base suspicious for pneumonia. Small right pleural effusion. No evidence of bowel obstruction or free air. Electronically Signed   By: Sherian Rein M.D.   On: 07/01/2015 20:59   Ct Renal Stone Study  07/01/2015  CLINICAL DATA:  Abdomen pain today.  Blood in the stool. EXAM: CT ABDOMEN AND PELVIS WITHOUT CONTRAST TECHNIQUE: Multidetector CT imaging of the abdomen and pelvis was performed following the standard protocol without IV contrast. COMPARISON:  March 31, 2014 FINDINGS: Lower chest: There are small bilateral pleural effusions. Consolidation of the right middle lobe is identified. There is mild atelectasis of the posterior bilateral lower lobes. The heart size is enlarged. Hepatobiliary: There is a calcified granuloma within the liver. No focal mass is identified in the liver on this noncontrast exam. The gallbladder is normal. Pancreas: No mass or inflammatory process  identified on this un-enhanced exam. Spleen: Within normal limits in size. Adrenals/Urinary Tract: There is no hydronephrosis bilaterally. There are left kidney cysts, largest measures 4.3 x 3.9 cm in the midpole. There is no right kidney mass. There is a 2 mm vascular calcification versus non obstructing calcification in the midpole left kidney. The adrenal glands are normal. The bladder is normal. Stomach/Bowel: No evidence of obstruction, inflammatory process, or abnormal fluid collections. There is diverticulosis of colon without diverticulitis. The appendix is normal. Vascular/Lymphatic: Calcified lymph nodes are identified in the porta hepatis. No pathologically enlarged lymph nodes. No evidence of abdominal aortic aneurysm. There is atherosclerosis of the abdominal aorta. Reproductive: No mass or other significant abnormality. Other: Ascites is identified in  the abdomen and pelvis. Musculoskeletal: Degenerative joint changes of the spine are identified. IMPRESSION: No bowel obstruction or diverticulitis. Bilateral pleural effusions with consolidation of the right middle lobe suspicious for pneumonia. Small amount of ascites in the abdomen and pelvis. Electronically Signed   By: Sherian Rein M.D.   On: 07/01/2015 21:08     CBC  Recent Labs Lab 06/27/15 0745 06/29/15 0600 07/01/15 1950 07/02/15 0210 07/02/15 0759  WBC 2.3* 4.0 5.2  --  3.3*  HGB 11.3* 12.2 11.7* 11.2* 11.0*  HCT 33.2* 35.2* 33.9* 32.2* 31.3*  PLT 46* 51* 35*  --  43*  MCV 84.9 83.8 84.1  --  83.5  MCH 28.9 29.0 29.0  --  29.3  MCHC 34.0 34.7 34.5  --  35.1  RDW 24.5* 24.3* 24.2*  --  24.4*  LYMPHSABS 0.3* 0.4* 0.2*  --   --   MONOABS 0.3 0.4 0.4  --   --   EOSABS 0.0 0.0 0.0  --   --   BASOSABS 0.0 0.0 0.0  --   --     Chemistries   Recent Labs Lab 06/27/15 0745 06/29/15 0600 06/30/15 0700 07/01/15 0740 07/01/15 1950 07/02/15 0759  NA 136 135 135 136 136 136  K 4.8 5.5* 5.7* 5.6* 4.7 4.4  CL 106 104 102 106 105 107  CO2 23 21* 22 23 22 23   GLUCOSE 81 123* 118* 85 115* 99  BUN 26* 38* 48* 55* 51* 45*  CREATININE 1.00 1.65* 2.15* 2.07* 1.73* 1.34*  CALCIUM 9.6 10.1 9.9 9.7 9.4 9.5  AST 212*  --   --  214* 212* 222*  ALT 21  --   --  27 30 30   ALKPHOS 63  --   --  60 59 57  BILITOT 2.6*  --   --  3.0* 2.9* 2.7*   ------------------------------------------------------------------------------------------------------------------ estimated creatinine clearance is 31.5 mL/min (by C-G formula based on Cr of 1.34). ------------------------------------------------------------------------------------------------------------------ No results for input(s): HGBA1C in the last 72 hours. ------------------------------------------------------------------------------------------------------------------ No results for input(s): CHOL, HDL, LDLCALC, TRIG, CHOLHDL,  LDLDIRECT in the last 72 hours. ------------------------------------------------------------------------------------------------------------------ No results for input(s): TSH, T4TOTAL, T3FREE, THYROIDAB in the last 72 hours.  Invalid input(s): FREET3 ------------------------------------------------------------------------------------------------------------------ No results for input(s): VITAMINB12, FOLATE, FERRITIN, TIBC, IRON, RETICCTPCT in the last 72 hours.  Coagulation profile No results for input(s): INR, PROTIME in the last 168 hours.  No results for input(s): DDIMER in the last 72 hours.  Cardiac Enzymes  Recent Labs Lab 07/01/15 1950 07/02/15 0014  TROPONINI 0.04* 0.05*   ------------------------------------------------------------------------------------------------------------------ Invalid input(s): POCBNP   CBG: No results for input(s): GLUCAP in the last 168 hours.     Studies: Dg Abd Acute W/chest  07/01/2015  CLINICAL DATA:  Heme-positive stool,  nausea vomiting. EXAM: DG ABDOMEN ACUTE W/ 1V CHEST COMPARISON:  Chest x-ray June 20, 2015 FINDINGS: There is patchy consolidation of the lateral right lung base. There is mild interstitial edema. The heart size is enlarged. There is a small right pleural effusion. There is no evidence of bowel obstruction or free air. Degenerative joint changes of the spine are noted. IMPRESSION: Congestive heart failure. Patchy consolidation of the lateral right lung base suspicious for pneumonia. Small right pleural effusion. No evidence of bowel obstruction or free air. Electronically Signed   By: Sherian Rein M.D.   On: 07/01/2015 20:59   Ct Renal Stone Study  07/01/2015  CLINICAL DATA:  Abdomen pain today.  Blood in the stool. EXAM: CT ABDOMEN AND PELVIS WITHOUT CONTRAST TECHNIQUE: Multidetector CT imaging of the abdomen and pelvis was performed following the standard protocol without IV contrast. COMPARISON:  March 31, 2014  FINDINGS: Lower chest: There are small bilateral pleural effusions. Consolidation of the right middle lobe is identified. There is mild atelectasis of the posterior bilateral lower lobes. The heart size is enlarged. Hepatobiliary: There is a calcified granuloma within the liver. No focal mass is identified in the liver on this noncontrast exam. The gallbladder is normal. Pancreas: No mass or inflammatory process identified on this un-enhanced exam. Spleen: Within normal limits in size. Adrenals/Urinary Tract: There is no hydronephrosis bilaterally. There are left kidney cysts, largest measures 4.3 x 3.9 cm in the midpole. There is no right kidney mass. There is a 2 mm vascular calcification versus non obstructing calcification in the midpole left kidney. The adrenal glands are normal. The bladder is normal. Stomach/Bowel: No evidence of obstruction, inflammatory process, or abnormal fluid collections. There is diverticulosis of colon without diverticulitis. The appendix is normal. Vascular/Lymphatic: Calcified lymph nodes are identified in the porta hepatis. No pathologically enlarged lymph nodes. No evidence of abdominal aortic aneurysm. There is atherosclerosis of the abdominal aorta. Reproductive: No mass or other significant abnormality. Other: Ascites is identified in the abdomen and pelvis. Musculoskeletal: Degenerative joint changes of the spine are identified. IMPRESSION: No bowel obstruction or diverticulitis. Bilateral pleural effusions with consolidation of the right middle lobe suspicious for pneumonia. Small amount of ascites in the abdomen and pelvis. Electronically Signed   By: Sherian Rein M.D.   On: 07/01/2015 21:08      Lab Results  Component Value Date   HGBA1C 6.5* 03/05/2011   Lab Results  Component Value Date   MICROALBUR 0.70 03/05/2011   LDLCALC 120* 03/06/2011   CREATININE 1.34* 07/02/2015       Scheduled Meds: . azaTHIOprine  75 mg Oral Daily  . ceFEPime (MAXIPIME) IV   1 g Intravenous Q24H  . diltiazem  360 mg Oral Daily  . folic acid  500 mcg Oral Daily  . gabapentin  100 mg Oral QHS  . metoprolol  50 mg Oral BID  . [START ON 07/05/2015] pantoprazole  40 mg Intravenous Q12H  . predniSONE  7.5 mg Oral Q breakfast  . ursodiol  300 mg Oral BID  . vancomycin  750 mg Intravenous Q24H   Continuous Infusions: . sodium chloride 50 mL/hr at 07/02/15 0020  . pantoprozole (PROTONIX) infusion 8 mg/hr (07/02/15 0731)        Time spent: >30 MINS    [facility]  [facility] Pager [phone]. If 7PM-7AM, please contact night-coverage at [email or web address], password [facility] 07/02/2015, 11:16 AM

## 2015-07-03 ENCOUNTER — Encounter: Payer: Self-pay | Admitting: Internal Medicine

## 2015-07-03 DIAGNOSIS — R778 Other specified abnormalities of plasma proteins: Secondary | ICD-10-CM | POA: Insufficient documentation

## 2015-07-03 DIAGNOSIS — R7989 Other specified abnormal findings of blood chemistry: Secondary | ICD-10-CM

## 2015-07-03 DIAGNOSIS — D696 Thrombocytopenia, unspecified: Secondary | ICD-10-CM

## 2015-07-03 DIAGNOSIS — R1013 Epigastric pain: Secondary | ICD-10-CM | POA: Insufficient documentation

## 2015-07-03 DIAGNOSIS — Q2733 Arteriovenous malformation of digestive system vessel: Secondary | ICD-10-CM

## 2015-07-03 DIAGNOSIS — K920 Hematemesis: Secondary | ICD-10-CM

## 2015-07-03 DIAGNOSIS — R109 Unspecified abdominal pain: Secondary | ICD-10-CM

## 2015-07-03 DIAGNOSIS — K922 Gastrointestinal hemorrhage, unspecified: Secondary | ICD-10-CM

## 2015-07-03 LAB — CBC
HCT: 31 % — ABNORMAL LOW (ref 36.0–46.0)
Hemoglobin: 10.7 g/dL — ABNORMAL LOW (ref 12.0–15.0)
MCH: 28.9 pg (ref 26.0–34.0)
MCHC: 34.5 g/dL (ref 30.0–36.0)
MCV: 83.8 fL (ref 78.0–100.0)
PLATELETS: 39 10*3/uL — AB (ref 150–400)
RBC: 3.7 MIL/uL — AB (ref 3.87–5.11)
RDW: 24.4 % — AB (ref 11.5–15.5)
WBC: 2.9 10*3/uL — ABNORMAL LOW (ref 4.0–10.5)

## 2015-07-03 LAB — BASIC METABOLIC PANEL
Anion gap: 5 (ref 5–15)
BUN: 37 mg/dL — AB (ref 6–20)
CHLORIDE: 109 mmol/L (ref 101–111)
CO2: 22 mmol/L (ref 22–32)
CREATININE: 1.14 mg/dL — AB (ref 0.44–1.00)
Calcium: 9.4 mg/dL (ref 8.9–10.3)
GFR calc Af Amer: 50 mL/min — ABNORMAL LOW (ref >60)
GFR calc non Af Amer: 44 mL/min — ABNORMAL LOW (ref >60)
Glucose, Bld: 83 mg/dL (ref 65–99)
Potassium: 5 mmol/L (ref 3.5–5.1)
SODIUM: 136 mmol/L (ref 135–145)

## 2015-07-03 LAB — URIC ACID: URIC ACID, SERUM: 9.6 mg/dL — AB (ref 2.3–6.6)

## 2015-07-03 LAB — URINE CULTURE: Culture: NO GROWTH

## 2015-07-03 MED ORDER — COLCHICINE 0.6 MG PO TABS
0.6000 mg | ORAL_TABLET | Freq: Two times a day (BID) | ORAL | Status: DC
  Administered 2015-07-03 – 2015-07-04 (×3): 0.6 mg via ORAL
  Filled 2015-07-03 (×3): qty 1

## 2015-07-03 MED ORDER — PANTOPRAZOLE SODIUM 40 MG PO TBEC: 40.0000 mg | DELAYED_RELEASE_TABLET | Freq: Every day | ORAL | Status: DC

## 2015-07-03 MED ORDER — GUAIFENESIN-DM 100-10 MG/5ML PO SYRP
5.0000 mL | ORAL_SOLUTION | Freq: Four times a day (QID) | ORAL | Status: DC | PRN
  Administered 2015-07-03: 5 mL via ORAL
  Filled 2015-07-03: qty 5

## 2015-07-03 MED ORDER — PANTOPRAZOLE SODIUM 40 MG PO TBEC
40.0000 mg | DELAYED_RELEASE_TABLET | Freq: Two times a day (BID) | ORAL | Status: DC
  Administered 2015-07-03 – 2015-07-04 (×2): 40 mg via ORAL
  Filled 2015-07-03 (×3): qty 1

## 2015-07-03 MED ORDER — METHYLPREDNISOLONE SODIUM SUCC 40 MG IJ SOLR
40.0000 mg | Freq: Once | INTRAMUSCULAR | Status: AC
  Administered 2015-07-03: 40 mg via INTRAVENOUS
  Filled 2015-07-03: qty 1

## 2015-07-03 NOTE — Clinical Social Work Note (Signed)
CSW advised Keri at [facility] that patient was being discharged tomorrow and would return to the facility.     Terron Merfeld, Juleen China, LCSW

## 2015-07-03 NOTE — Progress Notes (Signed)
PT Cancellation Note  Patient Details Name: Eileen Santana MRN: [id number] DOB: June 08, 1930   Cancelled Treatment:    Reason Eval/Treat Not Completed: Patient declined, no reason specified (Pt adamantly refused, stating that she would attempt tomorrow. )   [name], PT, DPT X: [phone]

## 2015-07-03 NOTE — Progress Notes (Signed)
Patient requesting something for cough.  Dr. Susie Cassette notified via text page.  Dr. Susie Cassette gave order for patient to receive Robitussin DM every 6 hours as needed for cough.

## 2015-07-03 NOTE — Progress Notes (Signed)
[facility] PROGRESS NOTE  Eileen Santana VWU:[id number] DOB: [birth year] DOA: 07/01/2015   PCP: [name], MD     Assessment/Plan: Active Problems:   HTN (hypertension), benign   Thrombocytopenia (HCC)   Rheumatoid arthritis (HCC)   Gastric AVM   HCAP (healthcare-associated pneumonia)   Elevated troponin I level   Abdominal pain   CRF (chronic renal failure)   Bleeding gastrointestinal   Hematemesis without nausea   Elevated troponin   82 y.o. female with a past medical history of GI bleed, iron-deficiency anemia, autoimmune hepatitis, gastric ulcer (NSAID-related), colon ulcer (NSAID-related), non-bleeding AVMs, gastritis was admitted for pneumonia. As per daughter, patient has been at rehabilitation facility and was given Percocet and Xanax 5 days ago after the patient became very confused and drowsy. This medication was stopped and this morning patient was back to her usual self. Patient complained of abdominal pain around 1 PM in the afternoon, and after that had one episode of coffee-ground emesis as per daughter. She has a history of anemia, gastric AVMs requiring multiple blood transfusions in the past. She hasn't had any blood transfusion in the past 6 months. She also has a history of chronic thrombocytopenia.  Assessment and plan 1. Healthcare associated pneumonia- chest x-ray showed consolidation of the right middle lobe suspicious for pneumonia, continue with   cefepime per pharmacy consultation. Discontinued vancomycin secondary to negative MRSA Follow blood cultures. 2. Vomiting ? GI bleed- patient had one episode of ? Coffee-ground emesis as per daughter. Serial hemoglobin stable. Continue with Protonix, GI recommends Protonix 40 mg twice a day. Previous history of gastric AVMs. Last EGD 2012. GI does not feel that the patient will develop significant anemia over the weekend requiring endoscopic evaluation. Patient needs to follow-up with Dr.  Karilyn Cota 3. Elevated troponin- patient has mild elevation of troponin 0.04>0.07,0.06, likely from demand ischemia. We'll follow serial troponin, unable to get aspirin due to severe thrombocytopenia and suspected GI bleeding 4. Chronic thrombocytopenia- stable, platelet count 35. Will follow CBC in a.m. 5. Atrial fibrillation- continue Cardizem, metoprolol. Patient not on anticoagulation due to GI bleed. Also hold aspirin at this time due to above. 6. History of rheumatoid arthritis- continue prednisone, Imuran. 7. Peripheral neuropathy-discontinue gabapentin 100 mg daily at bedtime, patient's daughter feels that the patient is on too many sedating medications, and gabapentin makes his PCP. 8. Acute gout left toe-start patient on colchicine twice a day, will give 1 dose of IV Solu-Medrol given severity of pain    DVT prophylaxsis SCDs  Code Status:  Full code    Family Communication: Discussed in detail with the patient/daughter, all imaging results, lab results explained to the patient   Disposition Plan:  Anticipate discharge to SNF tomorrow      Consultants:  Gastroenterology  Procedures:  None  Antibiotics: Anti-infectives    Start     Dose/Rate Route Frequency Ordered Stop   07/02/15 2100  ceFEPIme (MAXIPIME) 1 g in dextrose 5 % 50 mL IVPB     1 g 100 mL/hr over 30 Minutes Intravenous Every 24 hours 07/02/15 0741     07/02/15 2000  vancomycin (VANCOCIN) IVPB 750 mg/150 ml premix     750 mg 150 mL/hr over 60 Minutes Intravenous Every 24 hours 07/02/15 0740 07/03/15      HPI/Subjective: Patient daughter is in the room, she denies any further episodes of hematemesis or melena. Hemodynamically stable. Patient states that she feels stronger and better  Objective: Filed  Vitals:   07/02/15 0600 07/02/15 1419 07/02/15 1948 07/03/15 0622  BP: 162/64 106/61 118/79 108/85  Pulse: 106 99 92 63  Temp: 97.6 F (36.4 C) 98 F (36.7 C) 98.2 F (36.8 C) 97.8 F (36.6 C)   TempSrc: Oral Oral Axillary Oral  Resp: Height:      Weight:      SpO2: 99% 97% 95%     Intake/Output Summary (Last 24 hours) at 07/03/15 1205 Last data filed at 07/03/15 0930  Gross per 24 hour  Intake 1598.34 ml  Output    400 ml  Net 1198.34 ml    Exam:  Examination:  General exam: Appears calm and comfortable  Respiratory system: Clear to auscultation. Respiratory effort normal. Cardiovascular system: S1 & S2 heard, RRR. No JVD, murmurs, rubs, gallops or clicks. No pedal edema. Gastrointestinal system: Abdomen is nondistended, soft and nontender. No organomegaly or masses felt. Normal bowel sounds heard. Central nervous system: Alert and oriented. No focal neurological deficits. Extremities: Symmetric 5 x 5 power. Skin: No rashes, lesions or ulcers Psychiatry: Judgement and insight appear normal. Mood & affect appropriate.     Data Reviewed: I have personally reviewed following labs and imaging studies  Micro Results Recent Results (from the past 240 hour(s))  Urine culture     Status: None   Collection Time: 07/01/15 10:00 PM  Result Value Ref Range Status   Specimen Description URINE, CATHETERIZED  Final   Special Requests NONE  Final   Culture NO GROWTH Performed at [facility]   Final   Report Status 07/03/2015 FINAL  Final  Culture, blood (Routine X 2) w Reflex to ID Panel     Status: None (Preliminary result)   Collection Time: 07/01/15 11:11 PM  Result Value Ref Range Status   Specimen Description BLOOD LEFT ARM  Final   Special Requests BOTTLES DRAWN AEROBIC AND ANAEROBIC 4CC EACH  Final   Culture NO GROWTH 1 DAY  Final   Report Status PENDING  Incomplete  MRSA PCR Screening     Status: None   Collection Time: 07/01/15 11:35 PM  Result Value Ref Range Status   MRSA by PCR NEGATIVE NEGATIVE Final    Comment:        The GeneXpert MRSA Assay (FDA approved for NASAL specimens only), is one component of a comprehensive MRSA  colonization surveillance program. It is not intended to diagnose MRSA infection nor to guide or monitor treatment for MRSA infections.   Culture, blood (Routine X 2) w Reflex to ID Panel     Status: None (Preliminary result)   Collection Time: 07/02/15 12:14 AM  Result Value Ref Range Status   Specimen Description BLOOD LEFT ANTECUBITAL  Final   Special Requests BOTTLES DRAWN AEROBIC ONLY 4CC  Final   Culture NO GROWTH 1 DAY  Final   Report Status PENDING  Incomplete    Radiology Reports Dg Chest 2 View  06/18/2015  CLINICAL DATA:  Acute onset of bilateral lower extremity weakness and swelling. Initial encounter. EXAM: CHEST  2 VIEW COMPARISON:  Chest radiograph performed 03/15/2015 FINDINGS: The lungs are well-aerated. Pulmonary vascularity is at the upper limits of normal. There is no evidence of focal opacification, pleural effusion or pneumothorax. The heart is enlarged.  No acute osseous abnormalities are seen. IMPRESSION: Cardiomegaly.  Lungs remain grossly clear. Electronically Signed   By: Roanna Raider M.D.   On: 06/18/2015 19:22   Dg Lumbar Spine 2-3 Views  06/19/2015  CLINICAL DATA:  Low back pain EXAM: LUMBAR SPINE - 2-3 VIEW COMPARISON:  MRI 03/15/2015 and x-ray 1/28/ 17 FINDINGS: Three views of the lumbar spine submitted. No acute fracture or subluxation. Again noted significant disc space flattening with vacuum disc phenomenon at L4-L5 and L5-S1 level. Facet degenerative changes L5 level. Atherosclerotic calcifications of abdominal aorta and iliac arteries are noted. Mild lower thoracic levoscoliosis. IMPRESSION: No acute fracture or subluxation. Again noted osteoarthritic changes at L4-L5 and L5-S1 level. Electronically Signed   By: Natasha Mead M.D.   On: 06/19/2015 12:13   Dg Pelvis 1-2 Views  06/19/2015  CLINICAL DATA:  Low back pain, leg pain EXAM: PELVIS - 1-2 VIEW COMPARISON:  CT scan 03/31/2014 FINDINGS: Single frontal view of the pelvis submitted. No acute fracture or  subluxation. Degenerative changes left SI joint again noted. Degenerative changes pubic symphysis. Mild degenerative changes bilateral hip joints with superior acetabular spurring. IMPRESSION: No acute fracture or subluxation. Osteoarthritic changes as described above. Electronically Signed   By: Natasha Mead M.D.   On: 06/19/2015 12:19   Dg Sacrum/coccyx  06/19/2015  CLINICAL DATA:  Low back pain leg pain, no known injury EXAM: SACRUM AND COCCYX - 2+ VIEW COMPARISON:  03/31/2014 FINDINGS: Three views of the sacrum and coccyx submitted. No acute fracture or subluxation. There is diffuse osteopenia. Significant osteoarthritic changes left SI joint. Degenerative changes pubic symphysis. There is a healed stress fracture in S3 level sacrum best seen on lateral view with alignment preserved. IMPRESSION: No acute fracture or subluxation. There is diffuse osteopenia. Significant osteoarthritic changes left SI joint. Degenerative changes pubic symphysis. There is a healed stress fracture in S3 level sacrum best seen on lateral view with alignment preserved. Electronically Signed   By: Natasha Mead M.D.   On: 06/19/2015 12:18   Ct Head Wo Contrast  06/18/2015  CLINICAL DATA:  Bilateral leg weakness. EXAM: CT HEAD WITHOUT CONTRAST TECHNIQUE: Contiguous axial images were obtained from the base of the skull through the vertex without intravenous contrast. COMPARISON:  03/13/2015 FINDINGS: Brain: No evidence of acute infarction, hemorrhage, extra-axial collection, ventriculomegaly, or mass effect. There is moderate brain parenchymal atrophy and mild bilateral deep white matter microvascular ischemia. Vascular: Vascular calcifications at the skullbase. Skull: Negative for fracture or focal lesion. Sinuses/Orbits: No acute findings. Other: None. IMPRESSION: No acute intracranial abnormality. Atrophy, chronic microvascular disease. Electronically Signed   By: Ted Mcalpine M.D.   On: 06/18/2015 19:43   Dg Chest Port 1  View  06/20/2015  CLINICAL DATA:  Cough EXAM: PORTABLE CHEST 1 VIEW COMPARISON:  06/18/2015 chest radiograph. FINDINGS: Stable cardiomediastinal silhouette with moderate cardiomegaly. No pneumothorax. Probable trace bilateral pleural effusions. Cephalization of the pulmonary vasculature without overt pulmonary edema. No acute consolidative airspace disease. IMPRESSION: Stable moderate cardiomegaly. Cephalization of the pulmonary vasculature without overt pulmonary edema. No acute consolidative airspace disease. Electronically Signed   By: Delbert Phenix M.D.   On: 06/20/2015 16:37   Dg Abd Acute W/chest  07/01/2015  CLINICAL DATA:  Heme-positive stool, nausea vomiting. EXAM: DG ABDOMEN ACUTE W/ 1V CHEST COMPARISON:  Chest x-ray June 20, 2015 FINDINGS: There is patchy consolidation of the lateral right lung base. There is mild interstitial edema. The heart size is enlarged. There is a small right pleural effusion. There is no evidence of bowel obstruction or free air. Degenerative joint changes of the spine are noted. IMPRESSION: Congestive heart failure. Patchy consolidation of the lateral right lung base suspicious for pneumonia. Small right  pleural effusion. No evidence of bowel obstruction or free air. Electronically Signed   By: Sherian Rein M.D.   On: 07/01/2015 20:59   Ct Renal Stone Study  07/01/2015  CLINICAL DATA:  Abdomen pain today.  Blood in the stool. EXAM: CT ABDOMEN AND PELVIS WITHOUT CONTRAST TECHNIQUE: Multidetector CT imaging of the abdomen and pelvis was performed following the standard protocol without IV contrast. COMPARISON:  March 31, 2014 FINDINGS: Lower chest: There are small bilateral pleural effusions. Consolidation of the right middle lobe is identified. There is mild atelectasis of the posterior bilateral lower lobes. The heart size is enlarged. Hepatobiliary: There is a calcified granuloma within the liver. No focal mass is identified in the liver on this noncontrast exam. The  gallbladder is normal. Pancreas: No mass or inflammatory process identified on this un-enhanced exam. Spleen: Within normal limits in size. Adrenals/Urinary Tract: There is no hydronephrosis bilaterally. There are left kidney cysts, largest measures 4.3 x 3.9 cm in the midpole. There is no right kidney mass. There is a 2 mm vascular calcification versus non obstructing calcification in the midpole left kidney. The adrenal glands are normal. The bladder is normal. Stomach/Bowel: No evidence of obstruction, inflammatory process, or abnormal fluid collections. There is diverticulosis of colon without diverticulitis. The appendix is normal. Vascular/Lymphatic: Calcified lymph nodes are identified in the porta hepatis. No pathologically enlarged lymph nodes. No evidence of abdominal aortic aneurysm. There is atherosclerosis of the abdominal aorta. Reproductive: No mass or other significant abnormality. Other: Ascites is identified in the abdomen and pelvis. Musculoskeletal: Degenerative joint changes of the spine are identified. IMPRESSION: No bowel obstruction or diverticulitis. Bilateral pleural effusions with consolidation of the right middle lobe suspicious for pneumonia. Small amount of ascites in the abdomen and pelvis. Electronically Signed   By: Sherian Rein M.D.   On: 07/01/2015 21:08     CBC  Recent Labs Lab 06/27/15 0745 06/29/15 0600 07/01/15 1950 07/02/15 0210 07/02/15 0759 07/03/15 0758  WBC 2.3* 4.0 5.2  --  3.3* 2.9*  HGB 11.3* 12.2 11.7* 11.2* 11.0* 10.7*  HCT 33.2* 35.2* 33.9* 32.2* 31.3* 31.0*  PLT 46* 51* 35*  --  43* 39*  MCV 84.9 83.8 84.1  --  83.5 83.8  MCH 28.9 29.0 29.0  --  29.3 28.9  MCHC 34.0 34.7 34.5  --  35.1 34.5  RDW 24.5* 24.3* 24.2*  --  24.4* 24.4*  LYMPHSABS 0.3* 0.4* 0.2*  --   --   --   MONOABS 0.3 0.4 0.4  --   --   --   EOSABS 0.0 0.0 0.0  --   --   --   BASOSABS 0.0 0.0 0.0  --   --   --     Chemistries   Recent Labs Lab 06/27/15 0745   06/30/15 0700 07/01/15 0740 07/01/15 1950 07/02/15 0759 07/03/15 0601  NA 136  < > 135 136 136 136 136  K 4.8  < > 5.7* 5.6* 4.7 4.4 5.0  CL 106  < > 102 106 105 107 109  CO2 23  < > 22 23 22 23 22   GLUCOSE 81  < > 118* 85 115* 99 83  BUN 26*  < > 48* 55* 51* 45* 37*  CREATININE 1.00  < > 2.15* 2.07* 1.73* 1.34* 1.14*  CALCIUM 9.6  < > 9.9 9.7 9.4 9.5 9.4  AST 212*  --   --  214* 212* 222*  --   ALT 21  --   --  27 30 30   --   ALKPHOS 63  --   --  60 59 57  --   BILITOT 2.6*  --   --  3.0* 2.9* 2.7*  --   < > = values in this interval not displayed. ------------------------------------------------------------------------------------------------------------------ estimated creatinine clearance is 37.1 mL/min (by C-G formula based on Cr of 1.14). ------------------------------------------------------------------------------------------------------------------ No results for input(s): HGBA1C in the last 72 hours. ------------------------------------------------------------------------------------------------------------------ No results for input(s): CHOL, HDL, LDLCALC, TRIG, CHOLHDL, LDLDIRECT in the last 72 hours. ------------------------------------------------------------------------------------------------------------------ No results for input(s): TSH, T4TOTAL, T3FREE, THYROIDAB in the last 72 hours.  Invalid input(s): FREET3 ------------------------------------------------------------------------------------------------------------------ No results for input(s): VITAMINB12, FOLATE, FERRITIN, TIBC, IRON, RETICCTPCT in the last 72 hours.  Coagulation profile No results for input(s): INR, PROTIME in the last 168 hours.  No results for input(s): DDIMER in the last 72 hours.  Cardiac Enzymes  Recent Labs Lab 07/02/15 0014 07/02/15 0759 07/02/15 1131  TROPONINI 0.05* 0.07* 0.06*    ------------------------------------------------------------------------------------------------------------------ Invalid input(s): POCBNP   CBG: No results for input(s): GLUCAP in the last 168 hours.     Studies: Dg Abd Acute W/chest  07/01/2015  CLINICAL DATA:  Heme-positive stool, nausea vomiting. EXAM: DG ABDOMEN ACUTE W/ 1V CHEST COMPARISON:  Chest x-ray June 20, 2015 FINDINGS: There is patchy consolidation of the lateral right lung base. There is mild interstitial edema. The heart size is enlarged. There is a small right pleural effusion. There is no evidence of bowel obstruction or free air. Degenerative joint changes of the spine are noted. IMPRESSION: Congestive heart failure. Patchy consolidation of the lateral right lung base suspicious for pneumonia. Small right pleural effusion. No evidence of bowel obstruction or free air. Electronically Signed   By: Sherian Rein M.D.   On: 07/01/2015 20:59   Ct Renal Stone Study  07/01/2015  CLINICAL DATA:  Abdomen pain today.  Blood in the stool. EXAM: CT ABDOMEN AND PELVIS WITHOUT CONTRAST TECHNIQUE: Multidetector CT imaging of the abdomen and pelvis was performed following the standard protocol without IV contrast. COMPARISON:  March 31, 2014 FINDINGS: Lower chest: There are small bilateral pleural effusions. Consolidation of the right middle lobe is identified. There is mild atelectasis of the posterior bilateral lower lobes. The heart size is enlarged. Hepatobiliary: There is a calcified granuloma within the liver. No focal mass is identified in the liver on this noncontrast exam. The gallbladder is normal. Pancreas: No mass or inflammatory process identified on this un-enhanced exam. Spleen: Within normal limits in size. Adrenals/Urinary Tract: There is no hydronephrosis bilaterally. There are left kidney cysts, largest measures 4.3 x 3.9 cm in the midpole. There is no right kidney mass. There is a 2 mm vascular calcification versus non  obstructing calcification in the midpole left kidney. The adrenal glands are normal. The bladder is normal. Stomach/Bowel: No evidence of obstruction, inflammatory process, or abnormal fluid collections. There is diverticulosis of colon without diverticulitis. The appendix is normal. Vascular/Lymphatic: Calcified lymph nodes are identified in the porta hepatis. No pathologically enlarged lymph nodes. No evidence of abdominal aortic aneurysm. There is atherosclerosis of the abdominal aorta. Reproductive: No mass or other significant abnormality. Other: Ascites is identified in the abdomen and pelvis. Musculoskeletal: Degenerative joint changes of the spine are identified. IMPRESSION: No bowel obstruction or diverticulitis. Bilateral pleural effusions with consolidation of the right middle lobe suspicious for pneumonia. Small amount of ascites in the abdomen and pelvis. Electronically Signed   By: Sherian Rein M.D.   On: 07/01/2015 21:08  Lab Results  Component Value Date   HGBA1C 6.5* 03/05/2011   Lab Results  Component Value Date   MICROALBUR 0.70 03/05/2011   LDLCALC 120* 03/06/2011   CREATININE 1.14* 07/03/2015       Scheduled Meds: . azaTHIOprine  75 mg Oral Daily  . ceFEPime (MAXIPIME) IV  1 g Intravenous Q24H  . colchicine  0.6 mg Oral BID  . diltiazem  360 mg Oral Daily  . folic acid  500 mcg Oral Daily  . metoprolol  50 mg Oral BID  . pantoprazole  40 mg Oral BID  . predniSONE  7.5 mg Oral Q breakfast  . ursodiol  300 mg Oral BID   Continuous Infusions: . sodium chloride 50 mL/hr at 07/02/15 1836     LOS: 1 day    Time spent: >30 MINS    [facility]  [facility] Pager [phone]. If 7PM-7AM, please contact night-coverage at [email or web address], password [facility] 07/03/2015, 12:05 PM  LOS: 1 day

## 2015-07-03 NOTE — Consult Note (Signed)
Referring Provider: No ref. provider found Primary Care Physician:  [name], MD Primary Gastroenterologist:  Dr. Karilyn Cota  Date of Admission: 07/01/15 Date of Consultation: 07/03/15  Reason for Consultation:  ? GI Bleed, mild anemia  HPI:  Eileen Santana is a 82 y.o. female with a past medical history of GI bleed, iron-deficiency anemia, autoimmune hepatitis, gastric ulcer (NSAID-related), colon ulcer (NSAID-related), non-bleeding AVMs, gastritis was admitted for pneumonia. At time of admission family notes nursing home stated they saw one episode of possible coffee-ground emesis. Lasd EGD 04/2010 which found multiple antral erosions, two antral ulcers near the pyloric channel without stigmata of bleeding. Colonoscopy the next day found a large ulcer over the terminal ileum deemed likely NSAID effect, two erosions at the sigmoid colon. Givens capsule 07/14/14 found single AVM in gastric fold without stigmata of bleeding, punctate erythema of antral mucosa, AVSs proximal jejunum without stigmata of bleeding. Patient also with significant thrombocytopenia with baseline platelets 30s-50s over the past several days. Historical abdominal imaging no noted cirrhosis. Has had some persistent elevation of AST only (222 today) over the past year for which she has seen her primary GI. Bili 2.7 today, within baseline over the past several months. Other LFTs normal. Is on ursodiol, Imuran, and prednisone.  Hgb on admission 11.7 and has remained relatively stable (11.0 this morning). Notable that the patient is on ASA 81 mg daily but not on PPI at home. No gross bleeding, specifically hematemesis, noted during admission. Currently on a PPI drip, NPO.   Today her visit is assisted by family. They state she had coffee-ground emesis on Wednesday (2 days ago). None further since. Has thrombocytopenia and history of recurrent GI bleed. They acknowledge her hgb is stable but are worried about her platelet count. Denies  chest pain, worsening dyspnea, weakness, fatigue. Has had some hallucinations recently. No hematochezia or melena. No other upper or lower GI complaints.  Past Medical History  Diagnosis Date  . History of GI bleed     Associated with NSAIDS  . Iron deficiency anemia     Transfusion dependent  . DJD (degenerative joint disease)   . Essential hypertension, benign   . History of colitis   . Ileitis   . Pancytopenia   . Autoimmune hepatitis (HCC)     With leukocytoclastic vasculitis  . Heart block AV second degree March 2013    a. Cardiology consult note 06/2013: "Question of previous second degree heart block, although review of cardiology notes indicates that this may have been actually blocked PACs when the patient was on verapamil."  . Bronchitis   . Steroid-induced myopathy   . Renal calculus 08/12/2011  . Rheumatoid arthritis(714.0)   . Colon ulcer 04/2010    NSAID related  . Gastric ulcer 04/2010    NSAID related  . UGI bleed 09/20/2011    Focal area of gastritis oozing of blood  . Gastric AVM 09/20/2011  . Candida esophagitis (HCC) 09/20/2011  . Paroxysmal atrial fibrillation (HCC)     Not on anticoag due to PMH GIB/AVM  . Cholestatic hepatitis   . Paroxysmal atrial flutter (HCC)     Not on anticoag 2/2 hx of GIB/AVM - dx 06/2013.  . Multifocal atrial tachycardia (HCC)   . Hypertrophic cardiomyopathy (HCC)     Echo 03/2011: severe LVH suggesting possble infiltrate cardiomyopathy or advanced hypertensive heart disease, increased  echogenicity of the ventricular septum as well as the pericardium, EF >70% with end systolicmid-cavitary obliteration of the ventricle, stage  1 DD, mild MR, small IVC.  Marland Kitchen Hypomagnesemia   . Chronic renal disease, stage 3, moderately decreased glomerular filtration rate between 30-59 mL/min/1.73 square meter 02/03/2014  . Anemia of chronic renal failure, stage 3 (moderate) 02/03/2014  . Low back pain   . 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 Biatrial  enlargement 04/04/2014  . History of pneumonia 12/2013  . Atrial fibrillation (HCC)   . Neuropathic pain     legs  . Pancytopenia [facility])     Past Surgical History  Procedure Laterality Date  . Colonoscopy  04/2010  . Upper gastrointestinal endoscopy  04/2010  . Esophagogastroduodenoscopy  09/20/2011    Status post APC.  Marland Kitchen Esophagogastroduodenoscopy  09/20/2011    Procedure: ESOPHAGOGASTRODUODENOSCOPY (EGD);  Surgeon: Malissa Hippo, MD;  Location[facility];  Service: Endoscopy;  Laterality: N/A;  . Cyst removal hand      Elbow  . Sebaceous cyst removed      bilateral elbows  . Colonoscopy N/A 07/04/2014    Procedure: COLONOSCOPY;  Surgeon: Malissa Hippo, MD;  Location: [facility];  Service: Endoscopy;  Laterality: N/A;  730  . Esophagogastroduodenoscopy N/A 07/04/2014    Procedure: ESOPHAGOGASTRODUODENOSCOPY (EGD);  Surgeon: Malissa Hippo, MD;  Location: [facility];  Service: Endoscopy;  Laterality: N/A;  . Givens capsule study N/A 07/14/2014    Procedure: GIVENS CAPSULE STUDY;  Surgeon: Malissa Hippo, MD;  Location: [facility];  Service: Endoscopy;  Laterality: N/A;  730    Prior to Admission medications   Medication Sig Start Date End Date Taking? Authorizing Provider  acetaminophen (TYLENOL) 325 MG tablet Take 650 mg by mouth every 6 (six) hours as needed. For pain   Yes Historical Provider, MD  aspirin 81 MG chewable tablet Chew 2 tablets (162 mg total) by mouth daily. 04/04/14  Yes Elliot Cousin, MD  azaTHIOprine (IMURAN) 50 MG tablet TAKE 1 AND 1/2 TABLETS BY MOUTH ONCE DAILY 03/06/15  Yes Len Blalock, NP  bisacodyl (DULCOLAX) 10 MG suppository Place 1 suppository (10 mg total) rectally as needed for moderate constipation. 05/12/15  Yes Malissa Hippo, MD  Cholecalciferol (VITAMIN D) 2000 UNITS tablet Take 2,000 Units by mouth daily.   Yes Historical Provider, MD  DEXILANT 60 MG capsule TAKE 1 CAPSULE BY MOUTH EVERY OTHER DAY 10/21/14  Yes Malissa Hippo, MD   diltiazem (TIAZAC) 360 MG 24 hr capsule Take 1 capsule (360 mg total) by mouth daily. 01/06/14  Yes Hollice Espy, MD  doxycycline (VIBRA-TABS) 100 MG tablet Take 100 mg by mouth 2 (two) times daily. Starting 06/27/2015 - 07/06/2015.   Yes Historical Provider, MD  folic acid (FOLVITE) 400 MCG tablet Take 400 mcg by mouth daily.   Yes Historical Provider, MD  furosemide (LASIX) 20 MG tablet Take 20 mg by mouth daily as needed for fluid (Give is weight is up 3 lbs.).   Yes Historical Provider, MD  lidocaine-prilocaine (EMLA) cream Apply 1 application topically as needed (pain).  06/20/14  Yes Historical Provider, MD  metoprolol (LOPRESSOR) 50 MG tablet Take 50 mg by mouth 2 (two) times daily.   Yes Historical Provider, MD  polyethylene glycol (MIRALAX / GLYCOLAX) packet Take 17 g by mouth daily. 03/17/15  Yes Marinda Elk, MD  predniSONE (DELTASONE) 5 MG tablet Take 7.5 mg by mouth daily with breakfast.  03/13/15  Yes Historical Provider, MD  Travoprost, BAK Free, (TRAVATAN) 0.004 % SOLN ophthalmic solution 1 drop at bedtime.  Yes Historical Provider, MD  ursodiol (ACTIGALL) 300 MG capsule Take 300 mg by mouth 2 (two) times daily.   Yes Historical Provider, MD  Rectal Cleansers (FLE[employer] CLEANSING ENEMA RE) As needed 1 max a day prn constipation    Historical Provider, MD    Current Facility-Administered Medications  Medication Dose Route Frequency Provider Last Rate Last Dose  . 0.9 %  sodium chloride infusion   Intravenous Continuous Meredeth Ide, MD 50 mL/hr at 07/02/15 1836    . acetaminophen (TYLENOL) tablet 650 mg  650 mg Oral Q6H PRN Meredeth Ide, MD       Or  . acetaminophen (TYLENOL) suppository 650 mg  650 mg Rectal Q6H PRN Meredeth Ide, MD      . azaTHIOprine (IMURAN) tablet 75 mg  75 mg Oral Daily Meredeth Ide, MD   75 mg at 07/02/15 0924  . ceFEPIme (MAXIPIME) 1 g in dextrose 5 % 50 mL IVPB  1 g Intravenous Q24H Richarda Overlie, MD   1 g at 07/02/15 2100  . diltiazem  (CARDIZEM CD) 24 hr capsule 360 mg  360 mg Oral Daily Richarda Overlie, MD   360 mg at 07/02/15 0923  . folic acid (FOLVITE) tablet 0.5 mg  500 mcg Oral Daily Meredeth Ide, MD   0.5 mg at 07/02/15 0923  . gabapentin (NEURONTIN) capsule 100 mg  100 mg Oral QHS Meredeth Ide, MD   100 mg at 07/02/15 2123  . metoprolol (LOPRESSOR) tablet 50 mg  50 mg Oral BID Meredeth Ide, MD   50 mg at 07/02/15 2123  . pantoprazole (PROTONIX) 80 mg in sodium chloride 0.9 % 250 mL (0.32 mg/mL) infusion  8 mg/hr Intravenous Continuous [place] Bali, MD 25 mL/hr at 07/02/15 1836 8 mg/hr at 07/02/15 1836  . [START ON 07/04/2015] pantoprazole (PROTONIX) EC tablet 40 mg  40 mg Oral QAC breakfast Sandi L Fields, MD      . predniSONE (DELTASONE) tablet 7.5 mg  7.5 mg Oral Q breakfast Meredeth Ide, MD   7.5 mg at 07/02/15 0801  . ursodiol (ACTIGALL) capsule 300 mg  300 mg Oral BID Meredeth Ide, MD   300 mg at 07/02/15 2123   Facility-Administered Medications Ordered in Other Encounters  Medication Dose Route Frequency Provider Last Rate Last Dose  . 0.9 %  sodium chloride infusion   Intravenous Continuous Ellouise Newer, PA-C   Stopped at 10/04/13 1230  . diphenhydrAMINE (BENADRYL) capsule 25 mg  25 mg Oral Q6H PRN Glenford Peers, MD   25 mg at 05/13/11 4540    Allergies as of 07/01/2015 - Review Complete 07/01/2015  Allergen Reaction Noted  . Iohexol Swelling 09/03/2010  . Ivp dye [iodinated diagnostic agents] Swelling 04/26/2011  . Naprosyn [naproxen] Hives and Itching 09/03/2010  . Red blood cells Swelling and Dermatitis 09/03/2010  . Verapamil  03/14/2011  . Vicodin [hydrocodone-acetaminophen] Hives and Itching 09/03/2010  . Sulfa antibiotics Itching and Rash 09/03/2010    Family History  Problem Relation Age of Onset  . Stroke Mother   . Heart disease Mother 54  . Hypertension Sister   . Hypertension Brother   . Heart disease Father 95  . COPD Brother   . Arthritis Brother   . Osteoporosis Sister   . Cancer  Neg Hx   . Diabetes Neg Hx     Social History   Social History  . Marital Status: Single    Spouse Name:  N/A  . Number of Children: N/A  . Years of Education: N/A   Occupational History  . Not on file.   Social History Main Topics  . Smoking status: Current Some Day Smoker -- 0.25 packs/day for 50 years    Types: Cigarettes    Start date: 04/10/1945  . Smokeless tobacco: Never Used     Comment: 1/3 ppd; stopped for @ least 8 years  . Alcohol Use: No  . Drug Use: No  . Sexual Activity: Not Currently   Other Topics Concern  . Not on file   Social History Narrative    Review of Systems: Gen: Denies fever, chills, loss of appetite, change in weight or weight loss CV: Denies chest pain, heart palpitations, syncope, edema  Resp: Denies shortness of breath with rest, cough, wheezing GI: Denies dysphagia or odynophagia. Denies vomiting blood, jaundice, and fecal incontinence.  GU : Denies urinary burning, urinary frequency, urinary incontinence.  MS: Denies joint pain,swelling, cramping Derm: Denies rash, itching, dry skin Psych: Denies depression, anxiety,confusion, or memory loss Heme: Denies bruising, bleeding, and enlarged lymph nodes.  Physical Exam: Vital signs in last 24 hours: Temp:  [97.8 F (36.6 C)-98.2 F (36.8 C)] 97.8 F (36.6 C) (06/30 0622) Pulse Rate:  [63-99] 63 (06/30 0622) Resp:  [18-20] 18 (06/30 0622) BP: (106-118)/(61-85) 108/85 mmHg (06/30 0622) SpO2:  [95 %-97 %] 95 % (06/29 1948) Last BM Date: 07/02/15 General:   Alert,  Well-developed, well-nourished, pleasant and cooperative in NAD Head:  Normocephalic and atraumatic. Eyes:  Sclera clear, no icterus.   Conjunctiva pink. Ears:  Normal auditory acuity. Nose:  No deformity, discharge,  or lesions. Mouth:  No deformity or lesions, dentition normal. Neck:  Supple; no masses or thyromegaly. Lungs:  Clear throughout to auscultation.   No wheezes, crackles, or rhonchi. No acute distress. Heart:   Regular rate and rhythm; no murmurs, clicks, rubs,  or gallops. Abdomen:  Soft, nontender and nondistended. No masses, hepatosplenomegaly or hernias noted. Normal bowel sounds, without guarding, and without rebound.   Rectal:  Deferred until time of colonoscopy.   Msk:  Symmetrical without gross deformities. Normal posture. Pulses:  Normal pulses noted. Extremities:  Without clubbing or edema. Neurologic:  Alert and  oriented x4;  grossly normal neurologically. Skin:  Intact without significant lesions or rashes. Cervical Nodes:  No significant cervical adenopathy. Psych:  Alert and cooperative. Normal mood and affect.  Intake/Output from previous day: 06/29 0701 - 06/30 0700 In: 1598.3 [I.V.:1598.3] Out: 400 [Urine:400] Intake/Output this shift:    Lab Results:  Recent Labs  07/01/15 1950 07/02/15 0210 07/02/15 0759 07/03/15 0758  WBC 5.2  --  3.3* 2.9*  HGB 11.7* 11.2* 11.0* 10.7*  HCT 33.9* 32.2* 31.3* 31.0*  PLT 35*  --  43* 39*   BMET  Recent Labs  07/01/15 1950 07/02/15 0759 07/03/15 0601  NA 136 136 136  K 4.7 4.4 5.0  CL 105 107 109  CO2 22 23 22   GLUCOSE 115* 99 83  BUN 51* 45* 37*  CREATININE 1.73* 1.34* 1.14*  CALCIUM 9.4 9.5 9.4   LFT  Recent Labs  07/01/15 0740 07/01/15 1950 07/02/15 0759  PROT 5.3* 5.2* 5.2*  ALBUMIN 2.4* 2.3* 2.4*  AST 214* 212* 222*  ALT 27 30 30   ALKPHOS 60 59 57  BILITOT 3.0* 2.9* 2.7*  BILIDIR 1.4*  --   --   IBILI 1.6*  --   --    PT/INR No results for input(s):  LABPROT, INR in the last 72 hours. Hepatitis Panel No results for input(s): HEPBSAG, HCVAB, HEPAIGM, HEPBIGM in the last 72 hours. C-Diff No results for input(s): CDIFFTOX in the last 72 hours.  Studies/Results: Dg Abd Acute W/chest  07/01/2015  CLINICAL DATA:  Heme-positive stool, nausea vomiting. EXAM: DG ABDOMEN ACUTE W/ 1V CHEST COMPARISON:  Chest x-ray June 20, 2015 FINDINGS: There is patchy consolidation of the lateral right lung base. There is  mild interstitial edema. The heart size is enlarged. There is a small right pleural effusion. There is no evidence of bowel obstruction or free air. Degenerative joint changes of the spine are noted. IMPRESSION: Congestive heart failure. Patchy consolidation of the lateral right lung base suspicious for pneumonia. Small right pleural effusion. No evidence of bowel obstruction or free air. Electronically Signed   By: Sherian Rein M.D.   On: 07/01/2015 20:59   Ct Renal Stone Study  07/01/2015  CLINICAL DATA:  Abdomen pain today.  Blood in the stool. EXAM: CT ABDOMEN AND PELVIS WITHOUT CONTRAST TECHNIQUE: Multidetector CT imaging of the abdomen and pelvis was performed following the standard protocol without IV contrast. COMPARISON:  March 31, 2014 FINDINGS: Lower chest: There are small bilateral pleural effusions. Consolidation of the right middle lobe is identified. There is mild atelectasis of the posterior bilateral lower lobes. The heart size is enlarged. Hepatobiliary: There is a calcified granuloma within the liver. No focal mass is identified in the liver on this noncontrast exam. The gallbladder is normal. Pancreas: No mass or inflammatory process identified on this un-enhanced exam. Spleen: Within normal limits in size. Adrenals/Urinary Tract: There is no hydronephrosis bilaterally. There are left kidney cysts, largest measures 4.3 x 3.9 cm in the midpole. There is no right kidney mass. There is a 2 mm vascular calcification versus non obstructing calcification in the midpole left kidney. The adrenal glands are normal. The bladder is normal. Stomach/Bowel: No evidence of obstruction, inflammatory process, or abnormal fluid collections. There is diverticulosis of colon without diverticulitis. The appendix is normal. Vascular/Lymphatic: Calcified lymph nodes are identified in the porta hepatis. No pathologically enlarged lymph nodes. No evidence of abdominal aortic aneurysm. There is atherosclerosis of the  abdominal aorta. Reproductive: No mass or other significant abnormality. Other: Ascites is identified in the abdomen and pelvis. Musculoskeletal: Degenerative joint changes of the spine are identified. IMPRESSION: No bowel obstruction or diverticulitis. Bilateral pleural effusions with consolidation of the right middle lobe suspicious for pneumonia. Small amount of ascites in the abdomen and pelvis. Electronically Signed   By: Sherian Rein M.D.   On: 07/01/2015 21:08    Impression: 82 year old female admitted for pneumonia with noted possible hematemesis at home, one episode. History of autoimmune hepatitis with some elevation in bilirubin and AST over the past year. On prednisone, Imuran, and ursodiol. History of UGI bleed and gastric erosions/ulcerations and colon ulcerations as well as iron deficiency anemia  deemed due to NSAID therapy. At home she is on ASA 81 mg daily, not on a PPI. Hgb has been relatively stable during admission, no GI bleed noted during admission.   Single episode of possible hematemesis with heme+ stool but no noted gross GI bleed likely due to AVMs, NSAID effect (on daily ASA) without outpatient PPI protection in the setting of thrombocytopenia. At this time there is no indication for endoscopic evaluation, unless there is a significant drop in hgb or significant GI bleed noted.  Plan: 1. Supportive measures 2. Stop PPI drip, change to  po Protonix 40 mg bid 3. Will need to be on PPI as outpatient, bid. 4. Transfuse as necessary 5. In the unlikely event she develops significant anemia over the weekend requiring endoscopic evaluation will need to be [facility] to [facility]  6. Patient will need eventual outpatient follow-up with Dr. Karilyn Cota  +++NOTE: there is no GI coverage from 5 pm today (Friday) 07/03/15 through 7 am Monday 07/06/15+++  Wynne Dust, AGNP-C Adult & Gerontological Nurse Practitioner [facility]     LOS: 1 day     07/03/2015,  9:29 AM

## 2015-07-03 NOTE — Care Management Important Message (Signed)
Important Message  Patient Details  Name: Eileen Santana MRN: [id number] Date of Birth: 1930/10/31   Medicare Important Message Given:  Yes    Whit Bruni, Chrystine Oiler, RN 07/03/2015, 1:23 PM

## 2015-07-03 NOTE — Progress Notes (Signed)
Patient ID: Eileen Santana, female   DOB: [birth year], 82 y.o.   MRN: [id number]   Date is 07/01/2015  This is an acute visit.  Level care skilled.  Facility [facility].    Chief complaint-acute visit secondary to abdominal distention and pain as well as emesis.  History of present illness.  Patient is a pleasant 82 year old female with a very complicated medical history.  She presented before hospitalization with transient peripheral neuropathy without a definite etiology also pancytopenia.  Most recent CBC did reveal significant improvement in white count and hematocrit as well as platelet count-still remained 51,000-on June 26 she was noted to have significant renal insufficiency with a creatinine 1.65 BUN of 38 potassium was 5.5-spironolactone and potassium were discontinued metabolic panel was repeated yesterday further progression of the insufficiency with a creatinine of 2.15 potassium level was 5.7.  She was placed on doxycycline on June 23 for cough with some blood-tinged sputum.  Updated lab today shows potassium minimally improved at 5.6 creatinine slightly improved at 2.07 BUN is 55.  Nursing staff reports patient had some vague complaints of abdominal discomfort earlier today this was thought possibly constipation related she does have a history of this-however this is progression this evening patient appears to be having increased abdominal pain and distention she also had a period of dark colored emesis which is concerning for possibly GI bleed.  Vital signs appear to be stable but she does appear to be increasingly uncomfortable area  Previous medical history.  History UTIs-peripheral neuropathy.  Hypertension.  Chronic back pain.  History of elevated lactic acidosis.  Acute renal failure superimposed on stage III chronic disease.  Chronic cough.  Digital joint disease.  Second degree heart block.  Rheumatoid arthritis.  Atrial fibrillation not on  chronic coagulation secondary to GI bleed  Steroid-induced myopathy.   Family medical social history reviewed  per history and physical on 06/18/2015  Medications.  Tylenol 650 mg every 6 hours when necessary.  Acttgail 10 mg twice a day.  Aspirin 81 mg daily.  Vitamin D 2000 units daily.  Coenzyme Q10 200 mg daily.  Dexalone 60 mg every other day.  Diltiazem ER 360 mg daily.  Dulcolax 10 mg once a day when necessary.  Fish oil 1000 mg daily.  Flonase 2 sprays both nostrils daily when necessary.  Folic acid 800 g daily.  Imuran 50 mg daily.  Lopressor 50 mg twice a day.  Magnesium 100 mg daily. 12 MiraLAX 17 g daily.  Multivitamin daily.  Neurontin 100 mg daily at bedtime.  Percocet 5-3 25 mg every 4 hours when necessary.  Potassium 20 mEq daily.  Prednisone 7.5 mg daily.  Spironolactone 50 mg daily.  Travatan Z 1 drop both eyes daily at bedtime.  Xanax 0.125 mg daily when necessary anxiety.  Circumflex tach 10 mg 2 day when necessary.  Aranesp 500 mcg/mL injections give subcutaneous every 21 days.  Lidocaine-Prilocaine apply topically as needed for pain once a day.  Review of systems this is limited secondary patient being somewhat poor historian and not speaking much.  In general is not complaining of fever or chills.  Respiratory is not complaining of acute shortness of breath does have a cough.  Cardiac is not specifically complaining of chest pain.  GI is complaining of abdominal discomfort this is difficult to localize has had a period of dark colored emesis.  Musculoskeletal is not complaining of acute joint pain other than generalized weakness at this point does have a history  of pain in her legs.  Neurologic is not complaining of dizziness headache or syncopal-type feelings.  Physical exam.  Currently afebrile pulse of 100 respirations 18 blood pressure 122/80  In general this is a frail elderly female she appears to be in mild  distress sitting up in bed.  She is alert and responsive very weak appearing.  Her skin is warm and dry.  Oropharynx appears to have some emesis residue on her tongue.  Chest is clear to auscultation with poor respiratory effort there is no labored breathing.  Heart is regular irregular rate and rhythm pulse of 100-she has I would say 1-2 plus lower extremity edema GU could not appreciate overt suprapubic tenderness.  GI abdomen somewhat protuberant it is soft there are positive but somewhat hypoactive bowel sounds.  Appears to have some diffuse tenderness to palpation.  Musculoskeletal is able to move all extremities 4 but limited exam since patient is in bed.  Neurologic cannot really appreciating lateralizing findings her speech is clear but she is not speaking much cranial nerves appear grossly intact.  Psych again difficult exam patient is not speaking much she does respond appropriately to questions although it takes her a while she does follow verbal commands.  Labs on 07/01/2015.  Sodium 136 potassium 5.6 BUN 55 creatinine 2.07.  Albumin 2.4 AST 214 ALT 27 phosphatase 60 total bilirubin of 3 direct bilirubin 1.48 and direct bilirubin 1.6.  06/29/2015.  Hemoglobin 12.2 CBC 4.0 platelets 51,000.  Assessment and plan.  Patient has multiple medical issues as noted above she continues to be quite fragile-she does have a history of pancytopenia with low platelets-she is now having some abdominal distention and pain-along with what could be blood-tinged emesis-will send her to the ER for expedient evaluation.  [id number]

## 2015-07-04 DIAGNOSIS — N183 Chronic kidney disease, stage 3 (moderate): Secondary | ICD-10-CM

## 2015-07-04 DIAGNOSIS — E2740 Unspecified adrenocortical insufficiency: Secondary | ICD-10-CM

## 2015-07-04 DIAGNOSIS — N179 Acute kidney failure, unspecified: Secondary | ICD-10-CM

## 2015-07-04 LAB — COMPREHENSIVE METABOLIC PANEL
ALBUMIN: 2.3 g/dL — AB (ref 3.5–5.0)
ALK PHOS: 72 U/L (ref 38–126)
ALT: 29 U/L (ref 14–54)
ANION GAP: 8 (ref 5–15)
AST: 224 U/L — ABNORMAL HIGH (ref 15–41)
BUN: 40 mg/dL — ABNORMAL HIGH (ref 6–20)
CALCIUM: 9.3 mg/dL (ref 8.9–10.3)
CO2: 18 mmol/L — AB (ref 22–32)
Chloride: 108 mmol/L (ref 101–111)
Creatinine, Ser: 1.44 mg/dL — ABNORMAL HIGH (ref 0.44–1.00)
GFR calc non Af Amer: 33 mL/min — ABNORMAL LOW (ref >60)
GFR, EST AFRICAN AMERICAN: 38 mL/min — AB (ref >60)
GLUCOSE: 154 mg/dL — AB (ref 65–99)
POTASSIUM: 4.6 mmol/L (ref 3.5–5.1)
SODIUM: 134 mmol/L — AB (ref 135–145)
Total Bilirubin: 3.6 mg/dL — ABNORMAL HIGH (ref 0.3–1.2)
Total Protein: 5.3 g/dL — ABNORMAL LOW (ref 6.5–8.1)

## 2015-07-04 LAB — CBC
HCT: 32.8 % — ABNORMAL LOW (ref 36.0–46.0)
HEMOGLOBIN: 11.2 g/dL — AB (ref 12.0–15.0)
MCH: 28.9 pg (ref 26.0–34.0)
MCHC: 34.1 g/dL (ref 30.0–36.0)
MCV: 84.5 fL (ref 78.0–100.0)
PLATELETS: 41 10*3/uL — AB (ref 150–400)
RBC: 3.88 MIL/uL (ref 3.87–5.11)
RDW: 24.5 % — AB (ref 11.5–15.5)
WBC: 3.3 10*3/uL — ABNORMAL LOW (ref 4.0–10.5)

## 2015-07-04 MED ORDER — AZITHROMYCIN 500 MG PO TABS: 500.0000 mg | ORAL_TABLET | Freq: Every day | ORAL | Status: DC

## 2015-07-04 MED ORDER — PANTOPRAZOLE SODIUM 40 MG PO TBEC: 40.0000 mg | DELAYED_RELEASE_TABLET | Freq: Two times a day (BID) | ORAL | Status: DC

## 2015-07-04 MED ORDER — ALLOPURINOL 100 MG PO TABS: 100.0000 mg | ORAL_TABLET | Freq: Every day | ORAL | Status: DC

## 2015-07-04 MED ORDER — GUAIFENESIN-DM 100-10 MG/5ML PO SYRP: 5.0000 mL | ORAL_SOLUTION | Freq: Four times a day (QID) | ORAL | Status: DC | PRN

## 2015-07-04 MED ORDER — CEFDINIR 300 MG PO CAPS: 300.0000 mg | ORAL_CAPSULE | Freq: Two times a day (BID) | ORAL | Status: DC

## 2015-07-04 MED ORDER — COLCHICINE 0.6 MG PO TABS: 0.6000 mg | ORAL_TABLET | Freq: Two times a day (BID) | ORAL | Status: DC

## 2015-07-04 NOTE — Discharge Summary (Signed)
Physician Discharge Summary  Eileen Santana MRN: [id number] DOB/AGE: 10-Jan-1930 82 y.o.  PCP: [name], MD   Admit date: 07/01/2015 Discharge date: 07/04/2015  Discharge Diagnoses:     Active Problems:   HTN (hypertension), benign   Thrombocytopenia (HCC)   Rheumatoid arthritis (HCC)   Gastric AVM   HCAP (healthcare-associated pneumonia)   Elevated troponin I level   Abdominal pain   CRF (chronic renal failure)   Bleeding gastrointestinal   Hematemesis without nausea   Elevated troponin Acute gout flare   Follow-up recommendations Follow-up with PCP in 3-5 days , including all  additional recommended appointments as below Follow-up CBC, CMP in 3-5 days  Patient to continue with oral antibiotics for another 7 days     Current Discharge Medication List    START taking these medications   Details  allopurinol (ZYLOPRIM) 100 MG tablet Take 1 tablet (100 mg total) by mouth daily. Qty: 30 tablet, Refills: 0    azithromycin (ZITHROMAX) 500 MG tablet Take 1 tablet (500 mg total) by mouth daily. Qty: 7 tablet, Refills: 0    cefdinir (OMNICEF) 300 MG capsule Take 1 capsule (300 mg total) by mouth 2 (two) times daily. Qty: 14 capsule, Refills: 0    colchicine 0.6 MG tablet Take 1 tablet (0.6 mg total) by mouth 2 (two) times daily. Qty: 20 tablet, Refills: 0    guaiFENesin-dextromethorphan (ROBITUSSIN DM) 100-10 MG/5ML syrup Take 5 mLs by mouth every 6 (six) hours as needed for cough. Qty: 118 mL, Refills: 0    pantoprazole (PROTONIX) 40 MG tablet Take 1 tablet (40 mg total) by mouth 2 (two) times daily. Qty: 60 tablet, Refills: 0      CONTINUE these medications which have NOT CHANGED   Details  acetaminophen (TYLENOL) 325 MG tablet Take 650 mg by mouth every 6 (six) hours as needed. For pain    azaTHIOprine (IMURAN) 50 MG tablet TAKE 1 AND 1/2 TABLETS BY MOUTH ONCE DAILY Qty: 60 tablet, Refills: 4    bisacodyl (DULCOLAX) 10 MG suppository Place 1 suppository  (10 mg total) rectally as needed for moderate constipation. Qty: 14 suppository, Refills: 1   Associated Diagnoses: Other constipation    Cholecalciferol (VITAMIN D) 2000 UNITS tablet Take 2,000 Units by mouth daily.    DEXILANT 60 MG capsule TAKE 1 CAPSULE BY MOUTH EVERY OTHER DAY Qty: 45 capsule, Refills: 3    diltiazem (TIAZAC) 360 MG 24 hr capsule Take 1 capsule (360 mg total) by mouth daily. Qty: 30 capsule, Refills: 1    folic acid (FOLVITE) 829 MCG tablet Take 400 mcg by mouth daily.    furosemide (LASIX) 20 MG tablet Take 20 mg by mouth daily as needed for fluid (Give is weight is up 3 lbs.).    lidocaine-prilocaine (EMLA) cream Apply 1 application topically as needed (pain).     metoprolol (LOPRESSOR) 50 MG tablet Take 50 mg by mouth 2 (two) times daily.    polyethylene glycol (MIRALAX / GLYCOLAX) packet Take 17 g by mouth daily. Qty: 14 each, Refills: 0    predniSONE (DELTASONE) 5 MG tablet Take 7.5 mg by mouth daily with breakfast.     Travoprost, BAK Free, (TRAVATAN) 0.004 % SOLN ophthalmic solution 1 drop at bedtime.    ursodiol (ACTIGALL) 300 MG capsule Take 300 mg by mouth 2 (two) times daily.    Rectal Cleansers (FLE[employer] CLEANSING ENEMA RE) As needed 1 max a day prn constipation      STOP taking  these medications     aspirin 81 MG chewable tablet      doxycycline (VIBRA-TABS) 100 MG tablet          Discharge Condition: Stable  Discharge Instructions Get Medicines reviewed and adjusted: Please take all your medications with you for your next visit with your Primary MD  Please request your Primary MD to go over all hospital tests and procedure/radiological results at the follow up, please ask your Primary MD to get all Hospital records sent to his/her office.  If you experience worsening of your admission symptoms, develop shortness of breath, life threatening emergency, suicidal or homicidal thoughts you must seek medical attention immediately  by calling 911 or calling your MD immediately if symptoms less severe.  You must read complete instructions/literature along with all the possible adverse reactions/side effects for all the Medicines you take and that have been prescribed to you. Take any new Medicines after you have completely understood and accpet all the possible adverse reactions/side effects.   Do not drive when taking Pain medications.   Do not take more than prescribed Pain, Sleep and Anxiety Medications  Special Instructions: If you have smoked or chewed Tobacco in the last 2 yrs please stop smoking, stop any regular Alcohol and or any Recreational drug use.  Wear Seat belts while driving.  Please note  You were cared for by a hospitalist during your hospital stay. Once you are discharged, your primary care physician will handle any further medical issues. Please note that NO REFILLS for any discharge medications will be authorized once you are discharged, as it is imperative that you return to your primary care physician (or establish a relationship with a primary care physician if you do not have one) for your aftercare needs so that they can reassess your need for medications and monitor your lab values.  Discharge Instructions    Diet - low sodium heart healthy    Complete by:  As directed      Increase activity slowly    Complete by:  As directed             Allergies  Allergen Reactions  . Gabapentin     confusion  . Iohexol Swelling    IV Dye   . Ivp Dye [Iodinated Diagnostic Agents] Swelling    Hives  . Naprosyn [Naproxen] Hives and Itching  . Red Blood Cells Swelling and Dermatitis    With blood transfusion 2012  . Verapamil     Heart block (2nd degree) Takes Cardizem   . Vicodin [Hydrocodone-Acetaminophen] Hives and Itching  . Sulfa Antibiotics Itching and Rash      Disposition: SNF   Consults:  Gastroenterology     Significant Diagnostic Studies:  Dg Chest 2  View  06/18/2015  CLINICAL DATA:  Acute onset of bilateral lower extremity weakness and swelling. Initial encounter. EXAM: CHEST  2 VIEW COMPARISON:  Chest radiograph performed 03/15/2015 FINDINGS: The lungs are well-aerated. Pulmonary vascularity is at the upper limits of normal. There is no evidence of focal opacification, pleural effusion or pneumothorax. The heart is enlarged.  No acute osseous abnormalities are seen. IMPRESSION: Cardiomegaly.  Lungs remain grossly clear. Electronically Signed   By: Garald Balding M.D.   On: 06/18/2015 19:22   Dg Lumbar Spine 2-3 Views  06/19/2015  CLINICAL DATA:  Low back pain EXAM: LUMBAR SPINE - 2-3 VIEW COMPARISON:  MRI 03/15/2015 and x-ray 1/28/ 17 FINDINGS: Three views of the lumbar spine submitted. No acute  fracture or subluxation. Again noted significant disc space flattening with vacuum disc phenomenon at L4-L5 and L5-S1 level. Facet degenerative changes L5 level. Atherosclerotic calcifications of abdominal aorta and iliac arteries are noted. Mild lower thoracic levoscoliosis. IMPRESSION: No acute fracture or subluxation. Again noted osteoarthritic changes at L4-L5 and L5-S1 level. Electronically Signed   By: Lahoma Crocker M.D.   On: 06/19/2015 12:13   Dg Pelvis 1-2 Views  06/19/2015  CLINICAL DATA:  Low back pain, leg pain EXAM: PELVIS - 1-2 VIEW COMPARISON:  CT scan 03/31/2014 FINDINGS: Single frontal view of the pelvis submitted. No acute fracture or subluxation. Degenerative changes left SI joint again noted. Degenerative changes pubic symphysis. Mild degenerative changes bilateral hip joints with superior acetabular spurring. IMPRESSION: No acute fracture or subluxation. Osteoarthritic changes as described above. Electronically Signed   By: Lahoma Crocker M.D.   On: 06/19/2015 12:19   Dg Sacrum/coccyx  06/19/2015  CLINICAL DATA:  Low back pain leg pain, no known injury EXAM: SACRUM AND COCCYX - 2+ VIEW COMPARISON:  03/31/2014 FINDINGS: Three views of the sacrum  and coccyx submitted. No acute fracture or subluxation. There is diffuse osteopenia. Significant osteoarthritic changes left SI joint. Degenerative changes pubic symphysis. There is a healed stress fracture in S3 level sacrum best seen on lateral view with alignment preserved. IMPRESSION: No acute fracture or subluxation. There is diffuse osteopenia. Significant osteoarthritic changes left SI joint. Degenerative changes pubic symphysis. There is a healed stress fracture in S3 level sacrum best seen on lateral view with alignment preserved. Electronically Signed   By: Lahoma Crocker M.D.   On: 06/19/2015 12:18   Ct Head Wo Contrast  06/18/2015  CLINICAL DATA:  Bilateral leg weakness. EXAM: CT HEAD WITHOUT CONTRAST TECHNIQUE: Contiguous axial images were obtained from the base of the skull through the vertex without intravenous contrast. COMPARISON:  03/13/2015 FINDINGS: Brain: No evidence of acute infarction, hemorrhage, extra-axial collection, ventriculomegaly, or mass effect. There is moderate brain parenchymal atrophy and mild bilateral deep white matter microvascular ischemia. Vascular: Vascular calcifications at the skullbase. Skull: Negative for fracture or focal lesion. Sinuses/Orbits: No acute findings. Other: None. IMPRESSION: No acute intracranial abnormality. Atrophy, chronic microvascular disease. Electronically Signed   By: Fidela Salisbury M.D.   On: 06/18/2015 19:43   Dg Chest Port 1 View  06/20/2015  CLINICAL DATA:  Cough EXAM: PORTABLE CHEST 1 VIEW COMPARISON:  06/18/2015 chest radiograph. FINDINGS: Stable cardiomediastinal silhouette with moderate cardiomegaly. No pneumothorax. Probable trace bilateral pleural effusions. Cephalization of the pulmonary vasculature without overt pulmonary edema. No acute consolidative airspace disease. IMPRESSION: Stable moderate cardiomegaly. Cephalization of the pulmonary vasculature without overt pulmonary edema. No acute consolidative airspace disease.  Electronically Signed   By: Ilona Sorrel M.D.   On: 06/20/2015 16:37   Dg Abd Acute W/chest  07/01/2015  CLINICAL DATA:  Heme-positive stool, nausea vomiting. EXAM: DG ABDOMEN ACUTE W/ 1V CHEST COMPARISON:  Chest x-ray June 20, 2015 FINDINGS: There is patchy consolidation of the lateral right lung base. There is mild interstitial edema. The heart size is enlarged. There is a small right pleural effusion. There is no evidence of bowel obstruction or free air. Degenerative joint changes of the spine are noted. IMPRESSION: Congestive heart failure. Patchy consolidation of the lateral right lung base suspicious for pneumonia. Small right pleural effusion. No evidence of bowel obstruction or free air. Electronically Signed   By: Abelardo Diesel M.D.   On: 07/01/2015 20:59   Ct Renal Stone Study  07/01/2015  CLINICAL DATA:  Abdomen pain today.  Blood in the stool. EXAM: CT ABDOMEN AND PELVIS WITHOUT CONTRAST TECHNIQUE: Multidetector CT imaging of the abdomen and pelvis was performed following the standard protocol without IV contrast. COMPARISON:  March 31, 2014 FINDINGS: Lower chest: There are small bilateral pleural effusions. Consolidation of the right middle lobe is identified. There is mild atelectasis of the posterior bilateral lower lobes. The heart size is enlarged. Hepatobiliary: There is a calcified granuloma within the liver. No focal mass is identified in the liver on this noncontrast exam. The gallbladder is normal. Pancreas: No mass or inflammatory process identified on this un-enhanced exam. Spleen: Within normal limits in size. Adrenals/Urinary Tract: There is no hydronephrosis bilaterally. There are left kidney cysts, largest measures 4.3 x 3.9 cm in the midpole. There is no right kidney mass. There is a 2 mm vascular calcification versus non obstructing calcification in the midpole left kidney. The adrenal glands are normal. The bladder is normal. Stomach/Bowel: No evidence of obstruction,  inflammatory process, or abnormal fluid collections. There is diverticulosis of colon without diverticulitis. The appendix is normal. Vascular/Lymphatic: Calcified lymph nodes are identified in the porta hepatis. No pathologically enlarged lymph nodes. No evidence of abdominal aortic aneurysm. There is atherosclerosis of the abdominal aorta. Reproductive: No mass or other significant abnormality. Other: Ascites is identified in the abdomen and pelvis. Musculoskeletal: Degenerative joint changes of the spine are identified. IMPRESSION: No bowel obstruction or diverticulitis. Bilateral pleural effusions with consolidation of the right middle lobe suspicious for pneumonia. Small amount of ascites in the abdomen and pelvis. Electronically Signed   By: Abelardo Diesel M.D.   On: 07/01/2015 21:08         Filed Weights   07/01/15 1912 07/01/15 2324  Weight: 68.947 kg (152 lb) 68.72 kg (151 lb 8 oz)     Microbiology: Recent Results (from the past 240 hour(s))  Urine culture     Status: None   Collection Time: 07/01/15 10:00 PM  Result Value Ref Range Status   Specimen Description URINE, CATHETERIZED  Final   Special Requests NONE  Final   Culture NO GROWTH Performed at [facility]   Final   Report Status 07/03/2015 FINAL  Final  Culture, blood (Routine X 2) w Reflex to ID Panel     Status: None (Preliminary result)   Collection Time: 07/01/15 11:11 PM  Result Value Ref Range Status   Specimen Description BLOOD LEFT ARM  Final   Special Requests BOTTLES DRAWN AEROBIC AND ANAEROBIC 4CC EACH  Final   Culture NO GROWTH 1 DAY  Final   Report Status PENDING  Incomplete  MRSA PCR Screening     Status: None   Collection Time: 07/01/15 11:35 PM  Result Value Ref Range Status   MRSA by PCR NEGATIVE NEGATIVE Final    Comment:        The GeneXpert MRSA Assay (FDA approved for NASAL specimens only), is one component of a comprehensive MRSA colonization surveillance program. It is  not intended to diagnose MRSA infection nor to guide or monitor treatment for MRSA infections.   Culture, blood (Routine X 2) w Reflex to ID Panel     Status: None (Preliminary result)   Collection Time: 07/02/15 12:14 AM  Result Value Ref Range Status   Specimen Description BLOOD LEFT ANTECUBITAL  Final   Special Requests BOTTLES DRAWN AEROBIC ONLY 4CC  Final   Culture NO GROWTH 1 DAY  Final   Report Status PENDING  Incomplete       Blood Culture    Component Value Date/Time   SDES BLOOD LEFT ANTECUBITAL 07/02/2015 0014   SPECREQUEST BOTTLES DRAWN AEROBIC ONLY 4CC 07/02/2015 0014   CULT NO GROWTH 1 DAY 07/02/2015 0014   REPTSTATUS PENDING 07/02/2015 0014      Labs: Results for orders placed or performed during the hospital encounter of 07/01/15 (from the past 48 hour(s))  Occult blood card to lab, stool RN will collect     Status: Abnormal   Collection Time: 07/02/15 11:20 AM  Result Value Ref Range   Fecal Occult Bld POSITIVE (A) NEGATIVE  Troponin I (q 6hr x 3)     Status: Abnormal   Collection Time: 07/02/15 11:31 AM  Result Value Ref Range   Troponin I 0.06 (HH) <0.03 ng/mL    Comment: DELTA CHECK NOTED CRITICAL RESULT CALLED TO, READ BACK BY AND VERIFIED WITH: BROWER,B AT 1310 ON 07/02/2015 BY ISLEY,B   Basic metabolic panel     Status: Abnormal   Collection Time: 07/03/15  6:01 AM  Result Value Ref Range   Sodium 136 135 - 145 mmol/L   Potassium 5.0 3.5 - 5.1 mmol/L   Chloride 109 101 - 111 mmol/L   CO2 22 22 - 32 mmol/L   Glucose, Bld 83 65 - 99 mg/dL   BUN 37 (H) 6 - 20 mg/dL   Creatinine, Ser 1.14 (H) 0.44 - 1.00 mg/dL   Calcium 9.4 8.9 - 10.3 mg/dL   GFR calc non Af Amer 44 (L) >60 mL/min   GFR calc Af Amer 50 (L) >60 mL/min    Comment: (NOTE) The eGFR has been calculated using the CKD EPI equation. This calculation has not been validated in all clinical situations. eGFR's persistently <60 mL/min signify possible Chronic Kidney Disease.    Anion  gap 5 5 - 15  Uric acid     Status: Abnormal   Collection Time: 07/03/15  6:01 AM  Result Value Ref Range   Uric Acid, Serum 9.6 (H) 2.3 - 6.6 mg/dL  CBC     Status: Abnormal   Collection Time: 07/03/15  7:58 AM  Result Value Ref Range   WBC 2.9 (L) 4.0 - 10.5 K/uL   RBC 3.70 (L) 3.87 - 5.11 MIL/uL   Hemoglobin 10.7 (L) 12.0 - 15.0 g/dL   HCT 31.0 (L) 36.0 - 46.0 %   MCV 83.8 78.0 - 100.0 fL   MCH 28.9 26.0 - 34.0 pg   MCHC 34.5 30.0 - 36.0 g/dL   RDW 24.4 (H) 11.5 - 15.5 %   Platelets 39 (L) 150 - 400 K/uL    Comment: SPECIMEN CHECKED FOR CLOTS PLATELET COUNT CONFIRMED BY SMEAR   CBC     Status: Abnormal   Collection Time: 07/04/15  6:25 AM  Result Value Ref Range   WBC 3.3 (L) 4.0 - 10.5 K/uL   RBC 3.88 3.87 - 5.11 MIL/uL   Hemoglobin 11.2 (L) 12.0 - 15.0 g/dL   HCT 32.8 (L) 36.0 - 46.0 %   MCV 84.5 78.0 - 100.0 fL   MCH 28.9 26.0 - 34.0 pg   MCHC 34.1 30.0 - 36.0 g/dL   RDW 24.5 (H) 11.5 - 15.5 %   Platelets 41 (L) 150 - 400 K/uL    Comment: SPECIMEN CHECKED FOR CLOTS PLATELET COUNT CONFIRMED BY SMEAR   Comprehensive metabolic panel     Status: Abnormal   Collection Time: 07/04/15  6:25 AM  Result Value  Ref Range   Sodium 134 (L) 135 - 145 mmol/L   Potassium 4.6 3.5 - 5.1 mmol/L   Chloride 108 101 - 111 mmol/L   CO2 18 (L) 22 - 32 mmol/L   Glucose, Bld 154 (H) 65 - 99 mg/dL   BUN 40 (H) 6 - 20 mg/dL   Creatinine, Ser 1.44 (H) 0.44 - 1.00 mg/dL   Calcium 9.3 8.9 - 10.3 mg/dL   Total Protein 5.3 (L) 6.5 - 8.1 g/dL   Albumin 2.3 (L) 3.5 - 5.0 g/dL   AST 224 (H) 15 - 41 U/L   ALT 29 14 - 54 U/L   Alkaline Phosphatase 72 38 - 126 U/L   Total Bilirubin 3.6 (H) 0.3 - 1.2 mg/dL   GFR calc non Af Amer 33 (L) >60 mL/min   GFR calc Af Amer 38 (L) >60 mL/min    Comment: (NOTE) The eGFR has been calculated using the CKD EPI equation. This calculation has not been validated in all clinical situations. eGFR's persistently <60 mL/min signify possible Chronic  Kidney Disease.    Anion gap 8 5 - 15     Lipid Panel     Component Value Date/Time   CHOL 196 03/06/2011 0410   TRIG 41 03/06/2011 0410   HDL 68 03/06/2011 0410   CHOLHDL 2.9 03/06/2011 0410   VLDL 8 03/06/2011 0410   LDLCALC 120* 03/06/2011 0410     Lab Results  Component Value Date   HGBA1C 6.5* 03/05/2011        82 y.o. female with a past medical history of GI bleed, iron-deficiency anemia, autoimmune hepatitis, gastric ulcer (NSAID-related), colon ulcer (NSAID-related), non-bleeding AVMs, gastritis was admitted for pneumonia. As per daughter, patient has been at rehabilitation facility and was given Percocet and Xanax 5 days ago after the patient became very confused and drowsy. This medication was stopped and this morning patient was back to her usual self. Patient complained of abdominal pain around 1 PM in the afternoon, and after that had one episode of coffee-ground emesis as per daughter. She has a history of anemia, gastric AVMs requiring multiple blood transfusions in the past. She hasn't had any blood transfusion in the past 6 months. She also has a history of chronic thrombocytopenia.  Hospital course 1. Healthcare associated pneumonia- chest x-ray showed consolidation of the right middle lobe suspicious for pneumonia, continue with cefepime per pharmacy consultation. Discontinued vancomycin secondary to negative MRSA Follow blood cultures. Patient transitioned to [facility] and azithromycin 7 days 2. Vomiting ? GI bleed- patient had one episode of ? Coffee-ground emesis as per daughter. Serial hemoglobin stable. Continue with Protonix, GI recommends Protonix 40 mg twice a day. Previous history of gastric AVMs. Last EGD 2012. GI does not feel that the patient will develop significant anemia over the weekend requiring endoscopic evaluation. Patient needs to follow-up with Dr. Laural Golden, aspirin 81 mg per day discontinued. 3. Elevated troponin- patient has mild elevation  of troponin 0.04>0.07,0.06, likely from demand ischemia. We'll follow serial troponin, unable to give  aspirin due to severe thrombocytopenia and suspected GI bleeding 4. Chronic thrombocytopenia- stable, platelet count 35. Will follow CBC in a.m. 5. Atrial fibrillation- continue Cardizem, metoprolol. Patient not on anticoagulation due to GI bleed. Also hold aspirin at this time due to above. 6. History of rheumatoid arthritis- continue prednisone, Imuran. 7. Peripheral neuropathy-discontinue gabapentin 100 mg daily at bedtime, patient's daughter feels that the patient is on too many sedating medications, and gabapentin makes his PCP.  8. Acute gout left toe-start patient on colchicine twice a day,   given 1 dose of IV Solu-Medrol given severity of pain, start allopurinol when acute gout is better . Uric acid this admission was 9.6.     Discharge Exam:   Blood pressure 111/72, pulse 111, temperature 97.1 F (36.2 C), temperature source Oral, resp. rate 22, height 5' 5"  (1.651 m), weight 68.72 kg (151 lb 8 oz), SpO2 100 %.  General exam: Appears calm and comfortable  Respiratory system: Clear to auscultation. Respiratory effort normal. Cardiovascular system: S1 & S2 heard, RRR. No JVD, murmurs, rubs, gallops or clicks. No pedal edema. Gastrointestinal system: Abdomen is nondistended, soft and nontender. No organomegaly or masses felt. Normal bowel sounds heard. Central nervous system: Alert and oriented. No focal neurological deficits. Extremities: Symmetric 5 x 5 power. Skin: No rashes, lesions or ulcers      Follow-up Information    Follow up with [name], MD. Schedule an appointment as soon as possible for a visit in 3 days.   Specialty:  Family Medicine   Contact information:   [address] [place] [address] [phone]       Signed: Reyne Dumas 07/04/2015, 9:29 AM        Time spent >45 mins

## 2015-07-04 NOTE — Progress Notes (Signed)
Patient being discharged to nursing home. IV.s removed and monitor removed. Rolled to [facility] center by staff.

## 2015-07-05 ENCOUNTER — Emergency Department (HOSPITAL_COMMUNITY): Payer: Medicare Other

## 2015-07-05 ENCOUNTER — Other Ambulatory Visit: Payer: Self-pay

## 2015-07-05 ENCOUNTER — Encounter (HOSPITAL_COMMUNITY)
Admission: RE | Admit: 2015-07-05 | Discharge: 2015-07-05 | Disposition: A | Discharge status: Home / Self Care | Payer: Medicare Other | Source: Ambulatory Visit | Attending: Oncology | Admitting: Oncology

## 2015-07-05 ENCOUNTER — Encounter (HOSPITAL_COMMUNITY): Payer: Self-pay | Admitting: *Deleted

## 2015-07-05 ENCOUNTER — Emergency Department (HOSPITAL_COMMUNITY)
Admission: EM | Admit: 2015-07-05 | Discharge: 2015-07-06 | Disposition: A | Discharge status: Home / Self Care | Payer: Medicare Other | Attending: Emergency Medicine | Admitting: Emergency Medicine

## 2015-07-05 DIAGNOSIS — F17210 Nicotine dependence, cigarettes, uncomplicated: Secondary | ICD-10-CM | POA: Insufficient documentation

## 2015-07-05 DIAGNOSIS — D631 Anemia in chronic kidney disease: Secondary | ICD-10-CM | POA: Insufficient documentation

## 2015-07-05 DIAGNOSIS — D500 Iron deficiency anemia secondary to blood loss (chronic): Secondary | ICD-10-CM | POA: Insufficient documentation

## 2015-07-05 DIAGNOSIS — M545 Low back pain: Secondary | ICD-10-CM | POA: Insufficient documentation

## 2015-07-05 DIAGNOSIS — I480 Paroxysmal atrial fibrillation: Secondary | ICD-10-CM | POA: Insufficient documentation

## 2015-07-05 DIAGNOSIS — N200 Calculus of kidney: Secondary | ICD-10-CM | POA: Diagnosis not present

## 2015-07-05 DIAGNOSIS — J189 Pneumonia, unspecified organism: Secondary | ICD-10-CM | POA: Insufficient documentation

## 2015-07-05 DIAGNOSIS — K754 Autoimmune hepatitis: Secondary | ICD-10-CM | POA: Diagnosis not present

## 2015-07-05 DIAGNOSIS — N183 Chronic kidney disease, stage 3 (moderate): Secondary | ICD-10-CM | POA: Insufficient documentation

## 2015-07-05 DIAGNOSIS — M069 Rheumatoid arthritis, unspecified: Secondary | ICD-10-CM | POA: Insufficient documentation

## 2015-07-05 DIAGNOSIS — R109 Unspecified abdominal pain: Secondary | ICD-10-CM | POA: Diagnosis present

## 2015-07-05 DIAGNOSIS — M549 Dorsalgia, unspecified: Secondary | ICD-10-CM

## 2015-07-05 DIAGNOSIS — N390 Urinary tract infection, site not specified: Secondary | ICD-10-CM | POA: Diagnosis not present

## 2015-07-05 DIAGNOSIS — Z882 Allergy status to sulfonamides status: Secondary | ICD-10-CM | POA: Insufficient documentation

## 2015-07-05 DIAGNOSIS — Z8719 Personal history of other diseases of the digestive system: Secondary | ICD-10-CM | POA: Insufficient documentation

## 2015-07-05 DIAGNOSIS — Z79899 Other long term (current) drug therapy: Secondary | ICD-10-CM | POA: Diagnosis not present

## 2015-07-05 DIAGNOSIS — I129 Hypertensive chronic kidney disease with stage 1 through stage 4 chronic kidney disease, or unspecified chronic kidney disease: Secondary | ICD-10-CM | POA: Insufficient documentation

## 2015-07-05 DIAGNOSIS — D638 Anemia in other chronic diseases classified elsewhere: Secondary | ICD-10-CM | POA: Insufficient documentation

## 2015-07-05 DIAGNOSIS — Z9889 Other specified postprocedural states: Secondary | ICD-10-CM | POA: Insufficient documentation

## 2015-07-05 DIAGNOSIS — I422 Other hypertrophic cardiomyopathy: Secondary | ICD-10-CM | POA: Insufficient documentation

## 2015-07-05 DIAGNOSIS — I700 Atherosclerosis of aorta: Secondary | ICD-10-CM | POA: Insufficient documentation

## 2015-07-05 DIAGNOSIS — Z87442 Personal history of urinary calculi: Secondary | ICD-10-CM | POA: Insufficient documentation

## 2015-07-05 DIAGNOSIS — Z8673 Personal history of transient ischemic attack (TIA), and cerebral infarction without residual deficits: Secondary | ICD-10-CM | POA: Insufficient documentation

## 2015-07-05 DIAGNOSIS — Z8701 Personal history of pneumonia (recurrent): Secondary | ICD-10-CM | POA: Insufficient documentation

## 2015-07-05 DIAGNOSIS — Z7982 Long term (current) use of aspirin: Secondary | ICD-10-CM | POA: Insufficient documentation

## 2015-07-05 LAB — CBC WITH DIFFERENTIAL/PLATELET
BASOS ABS: 0 10*3/uL (ref 0.0–0.1)
Basophils Absolute: 0 10*3/uL (ref 0.0–0.1)
Basophils Relative: 0 %
Basophils Relative: 0 %
EOS ABS: 0 10*3/uL (ref 0.0–0.7)
EOS PCT: 0 %
Eosinophils Absolute: 0 10*3/uL (ref 0.0–0.7)
Eosinophils Relative: 0 %
HCT: 33.6 % — ABNORMAL LOW (ref 36.0–46.0)
HCT: 32.4 % — ABNORMAL LOW (ref 36.0–46.0)
HEMOGLOBIN: 11.5 g/dL — AB (ref 12.0–15.0)
HEMOGLOBIN: 11 g/dL — AB (ref 12.0–15.0)
LYMPHS ABS: 0.3 10*3/uL — AB (ref 0.7–4.0)
Lymphocytes Relative: 11 %
Lymphocytes Relative: 7 %
Lymphs Abs: 0.5 10*3/uL — ABNORMAL LOW (ref 0.7–4.0)
MCH: 28.9 pg (ref 26.0–34.0)
MCH: 28.9 pg (ref 26.0–34.0)
MCHC: 34.2 g/dL (ref 30.0–36.0)
MCHC: 34 g/dL (ref 30.0–36.0)
MCV: 84.4 fL (ref 78.0–100.0)
MCV: 85 fL (ref 78.0–100.0)
MONO ABS: 0.4 10*3/uL (ref 0.1–1.0)
MONO ABS: 0.4 10*3/uL (ref 0.1–1.0)
MONOS PCT: 9 %
MONOS PCT: 9 %
NEUTROS ABS: 3.9 10*3/uL (ref 1.7–7.7)
Neutro Abs: 3.9 10*3/uL (ref 1.7–7.7)
Neutrophils Relative %: 81 %
Neutrophils Relative %: 84 %
PLATELETS: 45 10*3/uL — AB (ref 150–400)
Platelets: 53 10*3/uL — ABNORMAL LOW (ref 150–400)
RBC: 3.98 MIL/uL (ref 3.87–5.11)
RBC: 3.81 MIL/uL — ABNORMAL LOW (ref 3.87–5.11)
RDW: 24.1 % — ABNORMAL HIGH (ref 11.5–15.5)
RDW: 24.1 % — AB (ref 11.5–15.5)
WBC: 4.9 10*3/uL (ref 4.0–10.5)
WBC: 4.6 10*3/uL (ref 4.0–10.5)

## 2015-07-05 LAB — URINALYSIS, ROUTINE W REFLEX MICROSCOPIC
GLUCOSE, UA: NEGATIVE mg/dL
HGB URINE DIPSTICK: NEGATIVE
Ketones, ur: NEGATIVE mg/dL
Leukocytes, UA: NEGATIVE
Nitrite: NEGATIVE
PH: 5.5 (ref 5.0–8.0)
SPECIFIC GRAVITY, URINE: 1.025 (ref 1.005–1.030)

## 2015-07-05 LAB — URINE MICROSCOPIC-ADD ON

## 2015-07-05 LAB — COMPREHENSIVE METABOLIC PANEL
ALK PHOS: 76 U/L (ref 38–126)
ALK PHOS: 73 U/L (ref 38–126)
ALT: 31 U/L (ref 14–54)
ALT: 32 U/L (ref 14–54)
ANION GAP: 6 (ref 5–15)
AST: 232 U/L — AB (ref 15–41)
AST: 225 U/L — ABNORMAL HIGH (ref 15–41)
Albumin: 2.3 g/dL — ABNORMAL LOW (ref 3.5–5.0)
Albumin: 2.2 g/dL — ABNORMAL LOW (ref 3.5–5.0)
Anion gap: 5 (ref 5–15)
BILIRUBIN TOTAL: 2.8 mg/dL — AB (ref 0.3–1.2)
BUN: 42 mg/dL — AB (ref 6–20)
BUN: 40 mg/dL — ABNORMAL HIGH (ref 6–20)
CALCIUM: 9.3 mg/dL (ref 8.9–10.3)
CALCIUM: 9.5 mg/dL (ref 8.9–10.3)
CHLORIDE: 109 mmol/L (ref 101–111)
CO2: 20 mmol/L — AB (ref 22–32)
CO2: 20 mmol/L — ABNORMAL LOW (ref 22–32)
CREATININE: 1.51 mg/dL — AB (ref 0.44–1.00)
Chloride: 109 mmol/L (ref 101–111)
Creatinine, Ser: 1.67 mg/dL — ABNORMAL HIGH (ref 0.44–1.00)
GFR calc Af Amer: 36 mL/min — ABNORMAL LOW (ref >60)
GFR calc non Af Amer: 31 mL/min — ABNORMAL LOW (ref >60)
GFR calc non Af Amer: 27 mL/min — ABNORMAL LOW (ref >60)
GFR, EST AFRICAN AMERICAN: 32 mL/min — AB (ref >60)
GLUCOSE: 124 mg/dL — AB (ref 65–99)
Glucose, Bld: 119 mg/dL — ABNORMAL HIGH (ref 65–99)
POTASSIUM: 4 mmol/L (ref 3.5–5.1)
Potassium: 4.5 mmol/L (ref 3.5–5.1)
SODIUM: 134 mmol/L — AB (ref 135–145)
SODIUM: 135 mmol/L (ref 135–145)
TOTAL PROTEIN: 5.4 g/dL — AB (ref 6.5–8.1)
Total Bilirubin: 2.9 mg/dL — ABNORMAL HIGH (ref 0.3–1.2)
Total Protein: 5.4 g/dL — ABNORMAL LOW (ref 6.5–8.1)

## 2015-07-05 LAB — PROTIME-INR
INR: 1.48 (ref 0.00–1.49)
Prothrombin Time: 18 seconds — ABNORMAL HIGH (ref 11.6–15.2)

## 2015-07-05 MED ORDER — SODIUM CHLORIDE 0.9 % IV BOLUS (SEPSIS)
500.0000 mL | Freq: Once | INTRAVENOUS | Status: AC
  Administered 2015-07-05: 500 mL via INTRAVENOUS

## 2015-07-05 MED ORDER — MORPHINE SULFATE (PF) 4 MG/ML IV SOLN
4.0000 mg | Freq: Once | INTRAVENOUS | Status: AC
  Administered 2015-07-05: 2 mg via INTRAVENOUS
  Filled 2015-07-05: qty 1

## 2015-07-05 NOTE — ED Notes (Signed)
Pt with right flank pain for past hour, pt is from [facility]. Tylenol given at [facility] center per report

## 2015-07-05 NOTE — ED Notes (Signed)
MRI aware of pt's presence

## 2015-07-05 NOTE — ED Notes (Signed)
MRI called to say pt's IV was flushing fine then infiltrated with contrast.

## 2015-07-05 NOTE — ED Provider Notes (Signed)
History  By signing my name below, I, Earmon Phoenix, attest that this documentation has been prepared under the direction and in the presence of Glynn Octave, MD. Electronically Signed: Earmon Phoenix, ED Scribe. 07/05/2015. 2:33 PM.  Chief Complaint  Patient presents with  . Flank Pain   The history is provided by the patient, medical records and a relative. No language interpreter was used.   LEVEL 5 CAVEAT- Full history could not be obtained due to pain.   HPI Comments:  Eileen Santana is a 82 y.o. female who presents to the Emergency Department complaining of sudden onset, worsening, severe left flank and low back pain, left side greater than right, that began about two hours ago while sitting down. Pt was discharged from the hospital yesterday for a UTI and pneumonia (currently still on antibiotic therapy) to a rehabilitation facility. She reports the pain radiates down BLE into her feet. Pt reports she has been told in the past that she has a known kidney stone. She has not had anything for pain. Moving increases her pain. She denies alleviating factors. She denies SOB, CP, nausea, vomiting, abdominal pain, constipation, diarrhea. She denies any previous back surgeries. Daughter reports PMHx of an autoimmune disorder, atrial fibrillation, HTN, CKD and chronic low back pain. She is not currently on anticoagulant therapy. She denies home oxygen therapy.  Past Medical History  Diagnosis Date  . History of GI bleed     Associated with NSAIDS  . Iron deficiency anemia     Transfusion dependent  . DJD (degenerative joint disease)   . Essential hypertension, benign   . History of colitis   . Ileitis   . Pancytopenia   . Autoimmune hepatitis (HCC)     With leukocytoclastic vasculitis  . Heart block AV second degree March 2013    a. Cardiology consult note 06/2013: "Question of previous second degree heart block, although review of cardiology notes indicates that this may have been  actually blocked PACs when the patient was on verapamil."  . Bronchitis   . Steroid-induced myopathy   . Renal calculus 08/12/2011  . Rheumatoid arthritis(714.0)   . Colon ulcer 04/2010    NSAID related  . Gastric ulcer 04/2010    NSAID related  . UGI bleed 09/20/2011    Focal area of gastritis oozing of blood  . Gastric AVM 09/20/2011  . Candida esophagitis (HCC) 09/20/2011  . Paroxysmal atrial fibrillation (HCC)     Not on anticoag due to PMH GIB/AVM  . Cholestatic hepatitis   . Paroxysmal atrial flutter (HCC)     Not on anticoag 2/2 hx of GIB/AVM - dx 06/2013.  . Multifocal atrial tachycardia (HCC)   . Hypertrophic cardiomyopathy (HCC)     Echo 03/2011: severe LVH suggesting possble infiltrate cardiomyopathy or advanced hypertensive heart disease, increased  echogenicity of the ventricular septum as well as the pericardium, EF >70% with end systolicmid-cavitary obliteration of the ventricle, stage 1 DD, mild MR, small IVC.  Marland Kitchen Hypomagnesemia   . Chronic renal disease, stage 3, moderately decreased glomerular filtration rate between 30-59 mL/min/1.73 square meter 02/03/2014  . Anemia of chronic renal failure, stage 3 (moderate) 02/03/2014  . Low back pain   . 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 Biatrial enlargement 04/04/2014  . History of pneumonia 12/2013  . Atrial fibrillation (HCC)   . Neuropathic pain     legs  . Pancytopenia [facility])    Past Surgical History  Procedure Laterality Date  . Colonoscopy  04/2010  . Upper gastrointestinal endoscopy  04/2010  . Esophagogastroduodenoscopy  09/20/2011    Status post APC.  Marland Kitchen Esophagogastroduodenoscopy  09/20/2011    Procedure: ESOPHAGOGASTRODUODENOSCOPY (EGD);  Surgeon: Malissa Hippo, MD;  Location: [facility];  Service: Endoscopy;  Laterality: N/A;  . Cyst removal hand      Elbow  . Sebaceous cyst removed      bilateral elbows  . Colonoscopy N/A 07/04/2014    Procedure: COLONOSCOPY;  Surgeon: Malissa Hippo, MD;  Location: [facility] ENDO  SUITE;  Service: Endoscopy;  Laterality: N/A;  730  . Esophagogastroduodenoscopy N/A 07/04/2014    Procedure: ESOPHAGOGASTRODUODENOSCOPY (EGD);  Surgeon: Malissa Hippo, MD;  Location: [facility];  Service: Endoscopy;  Laterality: N/A;  . Givens capsule study N/A 07/14/2014    Procedure: GIVENS CAPSULE STUDY;  Surgeon: Malissa Hippo, MD;  Location: [facility];  Service: Endoscopy;  Laterality: N/A;  730   Family History  Problem Relation Age of Onset  . Stroke Mother   . Heart disease Mother 70  . Hypertension Sister   . Hypertension Brother   . Heart disease Father 95  . COPD Brother   . Arthritis Brother   . Osteoporosis Sister   . Cancer Neg Hx   . Diabetes Neg Hx    Social History  Substance Use Topics  . Smoking status: Current Some Day Smoker -- 0.25 packs/day for 50 years    Types: Cigarettes    Start date: 04/10/1945  . Smokeless tobacco: Never Used     Comment: 1/3 ppd; stopped for @ least 8 years  . Alcohol Use: No   OB History    Gravida Para Term Preterm AB TAB SAB Ectopic Multiple Living   LEVEL 5 CAVEAT- Full history could not be obtained due to pain.   Review of Systems  Unable to perform ROS: Acuity of condition   Allergies  Gabapentin; Iohexol; Ivp dye; Naprosyn; Red blood cells; Verapamil; Vicodin; and Sulfa antibiotics  Home Medications   Prior to Admission medications   Medication Sig Start Date End Date Taking? Authorizing Provider  acetaminophen (TYLENOL) 325 MG tablet Take 650 mg by mouth every 6 (six) hours as needed. For pain   Yes Historical Provider, MD  allopurinol (ZYLOPRIM) 100 MG tablet Take 1 tablet (100 mg total) by mouth daily. 07/13/15  Yes Richarda Overlie, MD  ALPRAZolam (XANAX) 0.25 MG tablet Take 0.125 mg by mouth at bedtime as needed for anxiety.   Yes Historical Provider, MD  aspirin 81 MG chewable tablet Chew 81 mg by mouth daily.   Yes Historical Provider, MD  azaTHIOprine (IMURAN) 50 MG tablet TAKE 1 AND  1/2 TABLETS BY MOUTH ONCE DAILY 03/06/15  Yes Len Blalock, NP  azithromycin (ZITHROMAX) 500 MG tablet Take 1 tablet (500 mg total) by mouth daily. 07/04/15  Yes Richarda Overlie, MD  bisacodyl (DULCOLAX) 10 MG suppository Place 1 suppository (10 mg total) rectally as needed for moderate constipation. 05/12/15  Yes Malissa Hippo, MD  cefdinir (OMNICEF) 300 MG capsule Take 1 capsule (300 mg total) by mouth 2 (two) times daily. 07/04/15  Yes Richarda Overlie, MD  cetirizine (ZYRTEC) 10 MG tablet Take 10 mg by mouth daily.   Yes Historical Provider, MD  Cholecalciferol (VITAMIN D) 2000 UNITS tablet Take 2,000 Units by mouth daily.   Yes Historical Provider, MD  Coenzyme Q10 (CO Q 10)  100 MG CAPS Take 200 mg by mouth daily.   Yes Historical Provider, MD  colchicine 0.6 MG tablet Take 1 tablet (0.6 mg total) by mouth 2 (two) times daily. 07/04/15  Yes Richarda Overlie, MD  Darbepoetin Alfa (ARANESP) 500 MCG/ML SOSY injection Inject 500 mcg into the skin once.   Yes Historical Provider, MD  DEXILANT 60 MG capsule TAKE 1 CAPSULE BY MOUTH EVERY OTHER DAY 10/21/14  Yes Malissa Hippo, MD  diltiazem (TIAZAC) 360 MG 24 hr capsule Take 1 capsule (360 mg total) by mouth daily. 01/06/14  Yes Hollice Espy, MD  folic acid (FOLVITE) 400 MCG tablet Take 800 mcg by mouth daily.    Yes Historical Provider, MD  furosemide (LASIX) 20 MG tablet Take 20 mg by mouth daily as needed for fluid (Give is weight is up 3 lbs.).   Yes Historical Provider, MD  gabapentin (NEURONTIN) 100 MG capsule Take 100 mg by mouth at bedtime.   Yes Historical Provider, MD  guaiFENesin (MUCINEX) 600 MG 12 hr tablet Take 600 mg by mouth 2 (two) times daily.   Yes Historical Provider, MD  guaiFENesin-dextromethorphan (ROBITUSSIN DM) 100-10 MG/5ML syrup Take 5 mLs by mouth every 6 (six) hours as needed for cough. 07/04/15  Yes Richarda Overlie, MD  lidocaine-prilocaine (EMLA) cream Apply 1 application topically as needed (pain).  06/20/14  Yes Historical Provider, MD   Magnesium 200 MG TABS Take 1 tablet by mouth daily.   Yes Historical Provider, MD  metoprolol (LOPRESSOR) 50 MG tablet Take 50 mg by mouth 2 (two) times daily.   Yes Historical Provider, MD  Multiple Vitamin (MULTIVITAMIN WITH MINERALS) TABS tablet Take 1 tablet by mouth daily.   Yes Historical Provider, MD  Omega-3 Fatty Acids (FISH OIL) 1000 MG CAPS Take 1 capsule by mouth daily.   Yes Historical Provider, MD  oxyCODONE-acetaminophen (PERCOCET/ROXICET) 5-325 MG tablet Take 1 tablet by mouth every 4 (four) hours as needed for moderate pain or severe pain.   Yes Historical Provider, MD  pantoprazole (PROTONIX) 40 MG tablet Take 1 tablet (40 mg total) by mouth 2 (two) times daily. 07/04/15  Yes Richarda Overlie, MD  polyethylene glycol (MIRALAX / GLYCOLAX) packet Take 17 g by mouth daily. 03/17/15  Yes Marinda Elk, MD  potassium chloride SA (K-DUR,KLOR-CON) 20 MEQ tablet Take 20 mEq by mouth daily.   Yes Historical Provider, MD  predniSONE (DELTASONE) 5 MG tablet Take 7.5 mg by mouth daily with breakfast.  03/13/15  Yes Historical Provider, MD  spironolactone (ALDACTONE) 50 MG tablet Take 50 mg by mouth daily.   Yes Historical Provider, MD  Travoprost, BAK Free, (TRAVATAN) 0.004 % SOLN ophthalmic solution 1 drop at bedtime.   Yes Historical Provider, MD  ursodiol (ACTIGALL) 300 MG capsule Take 300 mg by mouth 2 (two) times daily.   Yes Historical Provider, MD  Rectal Cleansers (FLEET [employer]UR[employer] CLEANSING ENEMA RE) As needed 1 max a day prn constipation    Historical Provider, MD   Triage Vitals: BP 120/81 mmHg  Pulse 54  Temp(Src) 98.7 F (37.1 C) (Oral)  Resp 18  SpO2 95% Physical Exam  Constitutional: She is oriented to person, place, and time. She appears well-developed and well-nourished. No distress.  Moaning in pain.  HENT:  Head: Normocephalic and atraumatic.  Mouth/Throat: Oropharynx is clear and moist. No oropharyngeal exudate.  Eyes: Conjunctivae and EOM are normal. Pupils are  equal, round, and reactive to light.  Neck: Normal range of motion. Neck supple.  No meningismus.  Cardiovascular: Normal rate, regular rhythm, normal heart sounds and intact distal pulses.   No murmur heard. Pulmonary/Chest: Effort normal and breath sounds normal. No respiratory distress. She has no wheezes. She has no rales.  Abdominal: Soft. Bowel sounds are normal. There is no tenderness. There is no rebound and no guarding.  Musculoskeletal: Normal range of motion. She exhibits edema. She exhibits no tenderness.  Left paraspinal as well as midline tenderness to the lumbar region. 2+ edema to bilateral feet. Intact DP pulses with doppler but weaker on L. Erythema to 1st MTP joint on left.  Neurological: She is alert and oriented to person, place, and time. No cranial nerve deficit. She exhibits normal muscle tone. Coordination normal.  No ataxia on finger to nose bilaterally. No pronator drift. 5/5 strength throughout. CN 2-12 intact.Equal grip strength. Sensation intact.   Skin: Skin is warm.  Psychiatric: She has a normal mood and affect. Her behavior is normal.  Nursing note and vitals reviewed.   ED Course  Procedures (including critical care time) DIAGNOSTIC STUDIES: Oxygen Saturation is 95% on RA, adequate by my interpretation.   COORDINATION OF CARE: 2:20 PM- Will order pain medication and labs. Pt verbalizes understanding and agrees to plan.  Medications  morphine 4 MG/ML injection 4 mg (2 mg Intravenous Given 07/05/15 1426)    Labs Review Labs Reviewed  URINALYSIS, ROUTINE W REFLEX MICROSCOPIC (NOT AT [facility]) - Abnormal; Notable for the following:    APPearance HAZY (*)    Bilirubin Urine SMALL (*)    Protein, ur TRACE (*)    All other components within normal limits  CBC WITH DIFFERENTIAL/PLATELET - Abnormal; Notable for the following:    RBC 3.81 (*)    Hemoglobin 11.0 (*)    HCT 32.4 (*)    RDW 24.1 (*)    Platelets 53 (*)    All other components within normal  limits  PROTIME-INR - Abnormal; Notable for the following:    Prothrombin Time 18.0 (*)    All other components within normal limits  URINE MICROSCOPIC-ADD ON - Abnormal; Notable for the following:    Squamous Epithelial / LPF 6-30 (*)    Bacteria, UA MANY (*)    Crystals CA OXALATE CRYSTALS (*)    All other components within normal limits  COMPREHENSIVE METABOLIC PANEL    Imaging Review Dg Abd Acute W/chest  07/05/2015  CLINICAL DATA:  Acute onset of right flank pain today. EXAM: DG ABDOMEN ACUTE W/ 1V CHEST COMPARISON:  CT scan and radiographs dated 07/01/2015 FINDINGS: There is chronic cardiomegaly with new bilateral hazy pulmonary edema and new pulmonary vascular congestion. Calcification in the arch of the aorta. No free air in the abdomen. Bowel gas pattern is normal. No acute bone abnormality. IMPRESSION: Cardiomegaly with bilateral pulmonary edema consistent with congestive heart failure. Benign appearing abdomen. Electronically Signed   By: Francene Boyers M.D.   On: 07/05/2015 16:09   Ct Renal Stone Study  07/05/2015  CLINICAL DATA:  Left flank pain. Currently treated for pneumonia and UTI EXAM: CT ABDOMEN AND PELVIS WITHOUT CONTRAST TECHNIQUE: Multidetector CT imaging of the abdomen and pelvis was performed following the standard protocol without IV contrast. COMPARISON:  CT abdomen pelvis 07/01/2015 FINDINGS: Lower chest: Small bilateral pleural effusion. Patchy infiltrate in the right lung base laterally is unchanged and consistent with pneumonia. Cardiac enlargement. Coronary calcification. Mild pericardial effusion Hepatobiliary: Mild irregularity of the liver contour suggesting cirrhosis. Moderate ascites has progressed in the interval. No  focal liver mass lesion. Layering density in the gallbladder may represent gallbladder sludge or gallbladder hemorrhage. Correlate with ultrasound if the patient is tender in the right upper quadrant. Bile ducts nondilated. Calcified lymph nodes in  the porta hepatis. Calcified granuloma posterior right liver. Pancreas: Negative Spleen: Negative Adrenals/Urinary Tract: 5 cm midpole cyst on the left. 2 x 4 mm nonobstructing midpole stone on the left. No ureteral obstruction. 15 mm right lower pole cyst. No bladder calculi. No bladder wall thickening. Stomach/Bowel: Stomach and duodenum normal. Negative for bowel obstruction. No bowel edema or mass lesion. Appendix not visualized Vascular/Lymphatic: Atherosclerotic aorta without aneurysm. No lymphadenopathy. Reproductive: Uterus is normal in size.  No pelvic mass. Other: Negative for hernia. Musculoskeletal: Disc and facet degeneration in the lower lumbar spine. No fracture or worrisome bone lesion. IMPRESSION: Progression of ascites.  Question cirrhosis. Layering density in the gallbladder may represent sludge or hemorrhage. Ultrasound of the gallbladder suggested if there is pain in this area. Coronary calcification and atherosclerotic aorta. Small bilateral pleural effusions. Right lower lobe infiltrate consistent with pneumonia is unchanged. Electronically Signed   By: Marlan Palau M.D.   On: 07/05/2015 15:48   I have personally reviewed and evaluated these images and lab results as part of my medical decision-making.   EKG Interpretation   Date/Time:  Sunday July 05 2015 14:27:00 EDT Ventricular Rate:  77 PR Interval:    QRS Duration: 86 QT Interval:  376 QTC Calculation: 426 R Axis:   2 Text Interpretation:  Atrial flutter with predominant 4:1 AV block  Probable anterior infarct, age indeterminate Baseline wander in lead(s) V3  Artifact wandering baseline Confirmed by Manus Gunning  MD, Chirstopher Iovino [phone]) on  07/05/2015 4:56:22 PM      MDM   Final diagnoses:  Flank pain  Back pain  Back pain  Back pain  Back pain  Patient discharged from hospital yesterday after hospitalization for pneumonia and UTI. Acute onset of severe left low back pain today without trauma. No fever or vomiting. No  abdominal pain or chest pain.  Patient very uncomfortable on arrival and difficult to gather history from. Reports no radiation of the pain.  CT on June 28 showed small calcification in L kidney.  UA today has no hematuria. Stable thrombocytopenia.  CT scan discussed with Dr. Chestine Spore. Left renal calculus has not moved. There is stable renal cysts. Gallbladder sludge is artifact. Patient has stable LFT elevation. She has no right upper quadrant pain. Daughter reports history of liver damage from transfusion reaction 4 years ago. Stable elevated of LFT and bilirubin.  Her back pain has improved. She has weak but dopplerable pulses in the lower extremities.  Aortic dissection is considered but patient is not having any back pain at this time. Discussed with Dr. Chestine Spore who states there is no evidence of this on noncontrast CT but is not ruled out. Patient has IV contrast allergy as well as elevated creatinine.  D/w family and patient.  Patient pain-free now after morphine. Her left DP pulse is slightly decreased compared to her right. BP is normal. Discussed with family that patient cannot receive IV contrast due to allergy and kidney function. They're agreeable to transfer to [facility] for MRA. If this is negative for dissection patient may need to have ultrasound of her gallbladder though she has no right upper quadrant pain. Her gallbladder could conceivably be causing her mid back pain.  LFTs are chronically elevated and seem to be at baseline.  Discussed with  Dr. Rhunette Croft who accepts patient for transfer.   I personally performed the services described in this documentation, which was scribed in my presence. The recorded information has been reviewed and is accurate.    Glynn Octave, MD 07/05/15 [phone]

## 2015-07-05 NOTE — ED Provider Notes (Signed)
PT TRANSFERRED HERE FROM [facility] FOR [facility].  PT REMAINS PAIN FREE.  WE WILL NOTIFY MRI AND LET THEM KNOW THAT PT IS HERE.  Jacalyn Lefevre, MD 07/08/15 [phone]

## 2015-07-05 NOTE — ED Notes (Signed)
Pt in xray dept

## 2015-07-05 NOTE — ED Notes (Signed)
EDP at bedside  

## 2015-07-05 NOTE — ED Notes (Addendum)
Note on wrong patient 

## 2015-07-05 NOTE — ED Notes (Signed)
MD at bedside. 

## 2015-07-05 NOTE — ED Notes (Signed)
Rest of morphine(2mg ) given IV

## 2015-07-06 ENCOUNTER — Other Ambulatory Visit (HOSPITAL_COMMUNITY): Payer: Medicare Other

## 2015-07-06 ENCOUNTER — Encounter (HOSPITAL_COMMUNITY)
Admission: RE | Admit: 2015-07-06 | Discharge: 2015-07-06 | Disposition: A | Discharge status: Home / Self Care | Payer: Medicare Other | Source: Skilled Nursing Facility | Attending: *Deleted | Admitting: *Deleted

## 2015-07-06 LAB — CBC WITH DIFFERENTIAL/PLATELET
BASOS ABS: 0 10*3/uL (ref 0.0–0.1)
BASOS PCT: 0 %
EOS ABS: 0 10*3/uL (ref 0.0–0.7)
EOS PCT: 1 %
HCT: 32.2 % — ABNORMAL LOW (ref 36.0–46.0)
Hemoglobin: 10.9 g/dL — ABNORMAL LOW (ref 12.0–15.0)
Lymphocytes Relative: 10 %
Lymphs Abs: 0.4 10*3/uL — ABNORMAL LOW (ref 0.7–4.0)
MCH: 28.8 pg (ref 26.0–34.0)
MCHC: 33.9 g/dL (ref 30.0–36.0)
MCV: 85.2 fL (ref 78.0–100.0)
MONO ABS: 0.5 10*3/uL (ref 0.1–1.0)
MONOS PCT: 11 %
Neutro Abs: 3.4 10*3/uL (ref 1.7–7.7)
Neutrophils Relative %: 79 %
PLATELETS: 38 10*3/uL — AB (ref 150–400)
RBC: 3.78 MIL/uL — ABNORMAL LOW (ref 3.87–5.11)
RDW: 23.6 % — AB (ref 11.5–15.5)
WBC: 4.3 10*3/uL (ref 4.0–10.5)

## 2015-07-06 LAB — COMPREHENSIVE METABOLIC PANEL
ALBUMIN: 2.2 g/dL — AB (ref 3.5–5.0)
ALT: 30 U/L (ref 14–54)
ANION GAP: 6 (ref 5–15)
AST: 216 U/L — AB (ref 15–41)
Alkaline Phosphatase: 64 U/L (ref 38–126)
BUN: 37 mg/dL — AB (ref 6–20)
CHLORIDE: 110 mmol/L (ref 101–111)
CO2: 20 mmol/L — ABNORMAL LOW (ref 22–32)
Calcium: 9.4 mg/dL (ref 8.9–10.3)
Creatinine, Ser: 1.32 mg/dL — ABNORMAL HIGH (ref 0.44–1.00)
GFR calc Af Amer: 42 mL/min — ABNORMAL LOW (ref >60)
GFR calc non Af Amer: 36 mL/min — ABNORMAL LOW (ref >60)
GLUCOSE: 97 mg/dL (ref 65–99)
POTASSIUM: 4 mmol/L (ref 3.5–5.1)
Sodium: 136 mmol/L (ref 135–145)
TOTAL PROTEIN: 5.1 g/dL — AB (ref 6.5–8.1)
Total Bilirubin: 2.4 mg/dL — ABNORMAL HIGH (ref 0.3–1.2)

## 2015-07-06 NOTE — ED Notes (Signed)
Pt's O2 dipped to 91%, EMT increased to 3L O2.  Pt resting, unlabored, family at bedside

## 2015-07-06 NOTE — ED Notes (Signed)
Reported to [place], [place] 88/62 BP.

## 2015-07-06 NOTE — ED Notes (Signed)
Pt stable, family at bedside states understanding of discharge instructions 

## 2015-07-06 NOTE — ED Notes (Signed)
Pt moved, waiting for PTAR.  Daughter signed and states understanding of discharge instructions.  Pt given crackers and soda as family requested

## 2015-07-06 NOTE — Discharge Instructions (Signed)
Continue antibiotics your were prescribed from hospital from UTI and pneumonia. Follow-up with your primary care doctor. Return here for any new/worsening symptoms.

## 2015-07-06 NOTE — ED Notes (Signed)
Pt's daughter recycled BP on monitor in room while this nurse was in room across the hall.  Pt resting, unlabored breathing.

## 2015-07-06 NOTE — ED Notes (Signed)
Spoke with daughter, pt daughter requesting results. Explained that [place], [place] has been on the phone with MRI but no results have been put in the computer yet.

## 2015-07-06 NOTE — ED Provider Notes (Signed)
Patient received in signout at shift change. See prior notes for full H&P. Briefly 82 year old female who originally presented to [facility] with back pain. Recent hospitalization for pneumonia as well as UTI. Currently still on antibiotics for this (azithromycin and omnicef per chart history). Today she underwent a renal CT which was negative for acute stone. She has had RUQ ultrasound due to sludge noted in gallbladder on CT-- no gallstones noted. There was apparently some noted discrepancy in her DP pulses. Given the patient cannot receive IV contrast for CT, she was sent here for MRI to rule out aortic dissection. Since arrival to call in ED patient has denied any pain. She has been sleeping in room without distress. Her vital signs remain stable.  Plan:  MRI is pending. No acute findings, may discharge home.  Results for orders placed or performed during the hospital encounter of 07/05/15  Urinalysis, Routine w reflex microscopic (not at [facility])  Result Value Ref Range   Color, Urine YELLOW YELLOW   APPearance HAZY (A) CLEAR   Specific Gravity, Urine 1.025 1.005 - 1.030   pH 5.5 5.0 - 8.0   Glucose, UA NEGATIVE NEGATIVE mg/dL   Hgb urine dipstick NEGATIVE NEGATIVE   Bilirubin Urine SMALL (A) NEGATIVE   Ketones, ur NEGATIVE NEGATIVE mg/dL   Protein, ur TRACE (A) NEGATIVE mg/dL   Nitrite NEGATIVE NEGATIVE   Leukocytes, UA NEGATIVE NEGATIVE  CBC with Differential  Result Value Ref Range   WBC 4.6 4.0 - 10.5 K/uL   RBC 3.81 (L) 3.87 - 5.11 MIL/uL   Hemoglobin 11.0 (L) 12.0 - 15.0 g/dL   HCT 16.1 (L) 09.6 - 04.5 %   MCV 85.0 78.0 - 100.0 fL   MCH 28.9 26.0 - 34.0 pg   MCHC 34.0 30.0 - 36.0 g/dL   RDW 40.9 (H) 81.1 - 91.4 %   Platelets 53 (L) 150 - 400 K/uL   Neutrophils Relative % 84 %   Neutro Abs 3.9 1.7 - 7.7 K/uL   Lymphocytes Relative 7 %   Lymphs Abs 0.3 (L) 0.7 - 4.0 K/uL   Monocytes Relative 9 %   Monocytes Absolute 0.4 0.1 - 1.0 K/uL   Eosinophils Relative 0 %   Eosinophils  Absolute 0.0 0.0 - 0.7 K/uL   Basophils Relative 0 %   Basophils Absolute 0.0 0.0 - 0.1 K/uL  Comprehensive metabolic panel  Result Value Ref Range   Sodium 135 135 - 145 mmol/L   Potassium 4.0 3.5 - 5.1 mmol/L   Chloride 109 101 - 111 mmol/L   CO2 20 (L) 22 - 32 mmol/L   Glucose, Bld 124 (H) 65 - 99 mg/dL   BUN 40 (H) 6 - 20 mg/dL   Creatinine, Ser 7.82 (H) 0.44 - 1.00 mg/dL   Calcium 9.5 8.9 - 95.6 mg/dL   Total Protein 5.4 (L) 6.5 - 8.1 g/dL   Albumin 2.2 (L) 3.5 - 5.0 g/dL   AST 213 (H) 15 - 41 U/L   ALT 32 14 - 54 U/L   Alkaline Phosphatase 73 38 - 126 U/L   Total Bilirubin 2.8 (H) 0.3 - 1.2 mg/dL   GFR calc non Af Amer 27 (L) >60 mL/min   GFR calc Af Amer 32 (L) >60 mL/min   Anion gap 6 5 - 15  Protime-INR  Result Value Ref Range   Prothrombin Time 18.0 (H) 11.6 - 15.2 seconds   INR 1.48 0.00 - 1.49  Urine microscopic-add on  Result Value Ref  Range   Squamous Epithelial / LPF 6-30 (A) NONE SEEN   WBC, UA 0-5 0 - 5 WBC/hpf   RBC / HPF 0-5 0 - 5 RBC/hpf   Bacteria, UA MANY (A) NONE SEEN   Crystals CA OXALATE CRYSTALS (A) NEGATIVE   Urine-Other YEAST PRESENT    Dg Chest 2 View  06/18/2015  CLINICAL DATA:  Acute onset of bilateral lower extremity weakness and swelling. Initial encounter. EXAM: CHEST  2 VIEW COMPARISON:  Chest radiograph performed 03/15/2015 FINDINGS: The lungs are well-aerated. Pulmonary vascularity is at the upper limits of normal. There is no evidence of focal opacification, pleural effusion or pneumothorax. The heart is enlarged.  No acute osseous abnormalities are seen. IMPRESSION: Cardiomegaly.  Lungs remain grossly clear. Electronically Signed   By: Roanna Raider M.D.   On: 06/18/2015 19:22   Dg Lumbar Spine 2-3 Views  06/19/2015  CLINICAL DATA:  Low back pain EXAM: LUMBAR SPINE - 2-3 VIEW COMPARISON:  MRI 03/15/2015 and x-ray 1/28/ 17 FINDINGS: Three views of the lumbar spine submitted. No acute fracture or subluxation. Again noted significant disc  space flattening with vacuum disc phenomenon at L4-L5 and L5-S1 level. Facet degenerative changes L5 level. Atherosclerotic calcifications of abdominal aorta and iliac arteries are noted. Mild lower thoracic levoscoliosis. IMPRESSION: No acute fracture or subluxation. Again noted osteoarthritic changes at L4-L5 and L5-S1 level. Electronically Signed   By: Natasha Mead M.D.   On: 06/19/2015 12:13   Dg Pelvis 1-2 Views  06/19/2015  CLINICAL DATA:  Low back pain, leg pain EXAM: PELVIS - 1-2 VIEW COMPARISON:  CT scan 03/31/2014 FINDINGS: Single frontal view of the pelvis submitted. No acute fracture or subluxation. Degenerative changes left SI joint again noted. Degenerative changes pubic symphysis. Mild degenerative changes bilateral hip joints with superior acetabular spurring. IMPRESSION: No acute fracture or subluxation. Osteoarthritic changes as described above. Electronically Signed   By: Natasha Mead M.D.   On: 06/19/2015 12:19   Dg Sacrum/coccyx  06/19/2015  CLINICAL DATA:  Low back pain leg pain, no known injury EXAM: SACRUM AND COCCYX - 2+ VIEW COMPARISON:  03/31/2014 FINDINGS: Three views of the sacrum and coccyx submitted. No acute fracture or subluxation. There is diffuse osteopenia. Significant osteoarthritic changes left SI joint. Degenerative changes pubic symphysis. There is a healed stress fracture in S3 level sacrum best seen on lateral view with alignment preserved. IMPRESSION: No acute fracture or subluxation. There is diffuse osteopenia. Significant osteoarthritic changes left SI joint. Degenerative changes pubic symphysis. There is a healed stress fracture in S3 level sacrum best seen on lateral view with alignment preserved. Electronically Signed   By: Natasha Mead M.D.   On: 06/19/2015 12:18   Ct Head Wo Contrast  06/18/2015  CLINICAL DATA:  Bilateral leg weakness. EXAM: CT HEAD WITHOUT CONTRAST TECHNIQUE: Contiguous axial images were obtained from the base of the skull through the vertex  without intravenous contrast. COMPARISON:  03/13/2015 FINDINGS: Brain: No evidence of acute infarction, hemorrhage, extra-axial collection, ventriculomegaly, or mass effect. There is moderate brain parenchymal atrophy and mild bilateral deep white matter microvascular ischemia. Vascular: Vascular calcifications at the skullbase. Skull: Negative for fracture or focal lesion. Sinuses/Orbits: No acute findings. Other: None. IMPRESSION: No acute intracranial abnormality. Atrophy, chronic microvascular disease. Electronically Signed   By: Ted Mcalpine M.D.   On: 06/18/2015 19:43   Dg Chest Port 1 View  06/20/2015  CLINICAL DATA:  Cough EXAM: PORTABLE CHEST 1 VIEW COMPARISON:  06/18/2015 chest radiograph. FINDINGS: Stable  cardiomediastinal silhouette with moderate cardiomegaly. No pneumothorax. Probable trace bilateral pleural effusions. Cephalization of the pulmonary vasculature without overt pulmonary edema. No acute consolidative airspace disease. IMPRESSION: Stable moderate cardiomegaly. Cephalization of the pulmonary vasculature without overt pulmonary edema. No acute consolidative airspace disease. Electronically Signed   By: Delbert Phenix M.D.   On: 06/20/2015 16:37   Dg Abd Acute W/chest  07/05/2015  CLINICAL DATA:  Acute onset of right flank pain today. EXAM: DG ABDOMEN ACUTE W/ 1V CHEST COMPARISON:  CT scan and radiographs dated 07/01/2015 FINDINGS: There is chronic cardiomegaly with new bilateral hazy pulmonary edema and new pulmonary vascular congestion. Calcification in the arch of the aorta. No free air in the abdomen. Bowel gas pattern is normal. No acute bone abnormality. IMPRESSION: Cardiomegaly with bilateral pulmonary edema consistent with congestive heart failure. Benign appearing abdomen. Electronically Signed   By: Francene Boyers M.D.   On: 07/05/2015 16:09   Dg Abd Acute W/chest  07/01/2015  CLINICAL DATA:  Heme-positive stool, nausea vomiting. EXAM: DG ABDOMEN ACUTE W/ 1V CHEST  COMPARISON:  Chest x-ray June 20, 2015 FINDINGS: There is patchy consolidation of the lateral right lung base. There is mild interstitial edema. The heart size is enlarged. There is a small right pleural effusion. There is no evidence of bowel obstruction or free air. Degenerative joint changes of the spine are noted. IMPRESSION: Congestive heart failure. Patchy consolidation of the lateral right lung base suspicious for pneumonia. Small right pleural effusion. No evidence of bowel obstruction or free air. Electronically Signed   By: Sherian Rein M.D.   On: 07/01/2015 20:59   Ct Renal Stone Study  07/05/2015  CLINICAL DATA:  Left flank pain. Currently treated for pneumonia and UTI EXAM: CT ABDOMEN AND PELVIS WITHOUT CONTRAST TECHNIQUE: Multidetector CT imaging of the abdomen and pelvis was performed following the standard protocol without IV contrast. COMPARISON:  CT abdomen pelvis 07/01/2015 FINDINGS: Lower chest: Small bilateral pleural effusion. Patchy infiltrate in the right lung base laterally is unchanged and consistent with pneumonia. Cardiac enlargement. Coronary calcification. Mild pericardial effusion Hepatobiliary: Mild irregularity of the liver contour suggesting cirrhosis. Moderate ascites has progressed in the interval. No focal liver mass lesion. Layering density in the gallbladder may represent gallbladder sludge or gallbladder hemorrhage. Correlate with ultrasound if the patient is tender in the right upper quadrant. Bile ducts nondilated. Calcified lymph nodes in the porta hepatis. Calcified granuloma posterior right liver. Pancreas: Negative Spleen: Negative Adrenals/Urinary Tract: 5 cm midpole cyst on the left. 2 x 4 mm nonobstructing midpole stone on the left. No ureteral obstruction. 15 mm right lower pole cyst. No bladder calculi. No bladder wall thickening. Stomach/Bowel: Stomach and duodenum normal. Negative for bowel obstruction. No bowel edema or mass lesion. Appendix not visualized  Vascular/Lymphatic: Atherosclerotic aorta without aneurysm. No lymphadenopathy. Reproductive: Uterus is normal in size.  No pelvic mass. Other: Negative for hernia. Musculoskeletal: Disc and facet degeneration in the lower lumbar spine. No fracture or worrisome bone lesion. IMPRESSION: Progression of ascites.  Question cirrhosis. Layering density in the gallbladder may represent sludge or hemorrhage. Ultrasound of the gallbladder suggested if there is pain in this area. Coronary calcification and atherosclerotic aorta. Small bilateral pleural effusions. Right lower lobe infiltrate consistent with pneumonia is unchanged. Electronically Signed   By: Marlan Palau M.D.   On: 07/05/2015 15:48   Ct Renal Stone Study  07/01/2015  CLINICAL DATA:  Abdomen pain today.  Blood in the stool. EXAM: CT ABDOMEN AND PELVIS WITHOUT CONTRAST  TECHNIQUE: Multidetector CT imaging of the abdomen and pelvis was performed following the standard protocol without IV contrast. COMPARISON:  March 31, 2014 FINDINGS: Lower chest: There are small bilateral pleural effusions. Consolidation of the right middle lobe is identified. There is mild atelectasis of the posterior bilateral lower lobes. The heart size is enlarged. Hepatobiliary: There is a calcified granuloma within the liver. No focal mass is identified in the liver on this noncontrast exam. The gallbladder is normal. Pancreas: No mass or inflammatory process identified on this un-enhanced exam. Spleen: Within normal limits in size. Adrenals/Urinary Tract: There is no hydronephrosis bilaterally. There are left kidney cysts, largest measures 4.3 x 3.9 cm in the midpole. There is no right kidney mass. There is a 2 mm vascular calcification versus non obstructing calcification in the midpole left kidney. The adrenal glands are normal. The bladder is normal. Stomach/Bowel: No evidence of obstruction, inflammatory process, or abnormal fluid collections. There is diverticulosis of colon  without diverticulitis. The appendix is normal. Vascular/Lymphatic: Calcified lymph nodes are identified in the porta hepatis. No pathologically enlarged lymph nodes. No evidence of abdominal aortic aneurysm. There is atherosclerosis of the abdominal aorta. Reproductive: No mass or other significant abnormality. Other: Ascites is identified in the abdomen and pelvis. Musculoskeletal: Degenerative joint changes of the spine are identified. IMPRESSION: No bowel obstruction or diverticulitis. Bilateral pleural effusions with consolidation of the right middle lobe suspicious for pneumonia. Small amount of ascites in the abdomen and pelvis. Electronically Signed   By: Sherian Rein M.D.   On: 07/01/2015 21:08   US Abdomen Limited Ruq  07/05/2015  CLINICAL DATA:  Acute onset of right sided back pain today. EXAM: US ABDOMEN LIMITED - RIGHT UPPER QUADRANT COMPARISON:  CT scan dated 07/05/2015 FINDINGS: Gallbladder: There is diffuse thickening of the gallbladder wall. No visible gallstones. Common bile duct: Diameter: 3 mm, normal. Liver: No focal lesion identified. Nodularity of the liver contour. Extensive ascites. IMPRESSION: Cirrhosis and ascites. Thickening of the gallbladder wall could be due to low albumin or 14. Chronic acalculous cholecystitis could also give this appearance. No gallstones. Electronically Signed   By: Francene Boyers M.D.   On: 07/05/2015 19:26    MRI with suboptimal study as IV contrast infiltrated.  Questionable dissection flap of descending aorta as well as unusual tapering of the mid-distal abdominal aorta.  Thoracic aorta and aortic arch not clearly visualized.  Patient has remained stable here in the ED-- she has mostly been sleeping and has not been in any acute distress.  Her VS remain stable, baseline for her.  She has intact DP pulses bilaterally.  Will consult with vascular surgery for recommendations.  0115-- spoke with Dr. Edilia Bo with vascular surgery-- has reviewed images, he  notes calcified aorta on the CT which attributes to tapering of the mid-distal aorta on MRI.  He does not appreciate a dissection or dissection flap.  He does note a renal stone on MRI.  No further intervention needed from a vascular standpoint.    Patient does have concurrent UTI and CAP.  Her UTI and renal stone may be contributing to her flank/back pain.  She also has hx of DDD and chronic low back pain.  As she is already on appropriate abx per her urine culture, feel she can be discharged back to her facility.  Will continue regular medications.  Follow-up with PCP.  Discussed plan with patient, he/she acknowledged understanding and agreed with plan of care.  Return precautions given for new or  worsening symptoms.  Garlon Hatchet, PA-C 07/06/15 [id number]  Rolland Porter, MD 07/17/15 [phone]

## 2015-07-06 NOTE — ED Notes (Signed)
Pt's daughter asking for results at nurses station, [place], [place] notified

## 2015-07-06 NOTE — ED Notes (Signed)
Spoke with pt's family to give update on care.  Pt's family unhappy that they haven't gotten answers sooner.  Explained to pt's family that a consult is in and we are waiting on a call back.  Family asking about [facility] for transport since pt was brought by [facility], explained to family that [facility] would be available for transport but we are unable to determine if it is a covered transfer or not.

## 2015-07-07 LAB — CULTURE, BLOOD (ROUTINE X 2)
CULTURE: NO GROWTH
Culture: NO GROWTH

## 2015-07-08 ENCOUNTER — Other Ambulatory Visit (HOSPITAL_COMMUNITY)
Admission: AD | Admit: 2015-07-08 | Discharge: 2015-07-08 | Disposition: A | Discharge status: Home / Self Care | Payer: Medicare Other | Source: Skilled Nursing Facility | Attending: Internal Medicine | Admitting: Internal Medicine

## 2015-07-08 ENCOUNTER — Encounter: Payer: Self-pay | Admitting: Internal Medicine

## 2015-07-08 ENCOUNTER — Non-Acute Institutional Stay (SKILLED_NURSING_FACILITY): Payer: Medicare Other | Admitting: Internal Medicine

## 2015-07-08 ENCOUNTER — Telehealth (INDEPENDENT_AMBULATORY_CARE_PROVIDER_SITE_OTHER): Payer: Self-pay | Admitting: Internal Medicine

## 2015-07-08 DIAGNOSIS — M10072 Idiopathic gout, left ankle and foot: Secondary | ICD-10-CM | POA: Diagnosis not present

## 2015-07-08 DIAGNOSIS — M1000 Idiopathic gout, unspecified site: Secondary | ICD-10-CM | POA: Diagnosis present

## 2015-07-08 DIAGNOSIS — R4189 Other symptoms and signs involving cognitive functions and awareness: Secondary | ICD-10-CM | POA: Diagnosis not present

## 2015-07-08 DIAGNOSIS — K920 Hematemesis: Secondary | ICD-10-CM

## 2015-07-08 DIAGNOSIS — M109 Gout, unspecified: Secondary | ICD-10-CM | POA: Insufficient documentation

## 2015-07-08 DIAGNOSIS — R1013 Epigastric pain: Secondary | ICD-10-CM

## 2015-07-08 DIAGNOSIS — J189 Pneumonia, unspecified organism: Secondary | ICD-10-CM | POA: Diagnosis not present

## 2015-07-08 LAB — BASIC METABOLIC PANEL
Anion gap: 6 (ref 5–15)
BUN: 27 mg/dL — ABNORMAL HIGH (ref 6–20)
CALCIUM: 9.6 mg/dL (ref 8.9–10.3)
CO2: 20 mmol/L — ABNORMAL LOW (ref 22–32)
CREATININE: 1.17 mg/dL — AB (ref 0.44–1.00)
Chloride: 110 mmol/L (ref 101–111)
GFR, EST AFRICAN AMERICAN: 49 mL/min — AB (ref >60)
GFR, EST NON AFRICAN AMERICAN: 42 mL/min — AB (ref >60)
Glucose, Bld: 77 mg/dL (ref 65–99)
Potassium: 3.8 mmol/L (ref 3.5–5.1)
SODIUM: 136 mmol/L (ref 135–145)

## 2015-07-08 NOTE — Assessment & Plan Note (Signed)
Monitor renal function on allopurinol

## 2015-07-08 NOTE — Assessment & Plan Note (Signed)
Monitor renal function on the PPI

## 2015-07-08 NOTE — Progress Notes (Signed)
Facility Location: [facility] Room Number: 155-P   PCP: [name], MD [address] [place] [address]    This is a nursing facility follow up for Nursing Facility readmission within 30 days.  Interim medical record and care since last [facility] visit was updated with review of diagnostic studies and change in clinical status since last visit were documented.  HPI: She was rehospitalized 6/28-07/04/15 with healthcare associated pneumonia.  Initial chest x-ray 6/28  also suggested congestive heart failure with patchy consolidation of the right lateral lung base suggestive  for pneumonia. Mild right effusion was present. She had presented with vomiting of coffee-ground material, abdominal pain, and rectal bleeding. CT renal study revealed consolidation the right middle lobe suspicious for pneumonia.She had small amount of ascites in the abdomen and pelvis. GI recommended Protonix twice a day. Hospital course was complicated by acute gout of the left toe. Colchicine was started twice a day and IV Solu-Medrol administered. Uric acid on admission was 9.6. She was discharged to [facility] to complete antibiotics over additional 7 days. MRI of the abdomen was was performed 7/2 for left flank and low back pain. IV contrast could not be given because of history of intolerance/allergy. Atherosclerotic heterogenous plaque was found in the aorta without true dissection. Bilateral pleural effusions were present, right greater than left. Cirrhosis was present with ascites. She was also found to have bilateral renal cyst. She's continued to have severe thrombocytopenia with platelet counts as low as 32,000. She's exhibited slightly progressive mild anemia. Off spironolactone there's been significant improvement in her renal function. Speech therapy evaluated the patient upon readmission to [facility]. Severe cognitive impairments was suggested by testing as outlined in the problem  list.  Review of systems: She has had loose to watery stool urination since yesterday. Stool is dark.  She did rest with Respiral last night. She has no bleeding dyscrasias despite the profoundly low platelet count. She has a nonproductive cough. She had been sleeping all afternoon due to profound fatigue after her physical therapy session. Denied are chest pain, palpitations, claudication, paroxysmal nocturnal dyspnea,  significant sputum. Epistaxis, hemoptysis, hematuria, melena, or rectal bleeding denied. No significant dyspepsia or dysphagia, or abdominal pain @ this time.  There is no abnormal bruising , bleeding, or difficulty stopping bleeding with injury. .  Physical exam:  Pertinent or positive findings: Intermittent side-to-side head tremor present. Patient slept during the exam. One daughter was her primary history provider. She has complete dentures. There is increased second heart sound. She had occasional premature beats. She intermittent nonproductive cough. Breath sounds are decreased. Abdomen is protuberant. She has 2+ pitting edema to mid shin. Pedal pulses are decreased. General appearance:Adequately nourished; no acute distress , increased work of breathing is present.   Lymphatic: No lymphadenopathy about the head, neck, axilla . Eyes: No conjunctival inflammation or lid edema is present. There is no scleral icterus. Nose:  External nasal examination shows no deformity or inflammation. Nasal mucosa are pink and moist without lesions ,exudates Oral exam: lips and gums are healthy appearing. Neck:  No thyromegaly, masses, tenderness noted.    Heart:  No gallop, murmur, click, rub .  Lungs:Chest clear to auscultation without wheezes, rhonchi,rales , rubs. Abdomen:Bowel sounds are normal. Abdomen is soft and nontender with no organomegaly, hernias,masses. GU: deferred . Extremities:  No cyanosis, clubbing  Neurologic exam : Strength decreased  in upper & lower  extremities Balance,Rhomberg,finger to nose testing could  not be completed due to clinical state Skin: Warm & dry w/o tenting. No significant lesions or rash.    See summary under each active problem in the Problem List with associated updated therapeutic plan

## 2015-07-08 NOTE — Telephone Encounter (Signed)
To be discussed with Dr.Rehman. 

## 2015-07-08 NOTE — Assessment & Plan Note (Signed)
Significant hepatorenal issues suggest increased risk of adverse reaction with initiation of Aricept or Namenda

## 2015-07-08 NOTE — Telephone Encounter (Signed)
Patient's daughter [name] presented to the office and stated that her mother is at the [facility] in room 155.  Having GI problems and abdominal pain.  MRI was done Sunday night at [facility].  Vomiting blood on Friday.  She would like a call from Dr. Karilyn Cota, very concerned.  [phone]

## 2015-07-08 NOTE — Assessment & Plan Note (Signed)
Ongoing GI involvement clinically necessary

## 2015-07-08 NOTE — Assessment & Plan Note (Signed)
Continue antibiotics orally until 07/11/15

## 2015-07-08 NOTE — Patient Instructions (Signed)
New orders for [facility] entry in am CBC and differential  BMET  Fasting cortisol to evaluate adrenal insufficiency Furosemide 20 mg daily as trial Probiotic daily for loose stool while on dual antibiotics.

## 2015-07-08 NOTE — Telephone Encounter (Signed)
Dr.Rehman was given this message and states that he will call [facility].

## 2015-07-09 ENCOUNTER — Encounter (HOSPITAL_COMMUNITY)
Admission: RE | Admit: 2015-07-09 | Discharge: 2015-07-09 | Disposition: A | Discharge status: Home / Self Care | Payer: Medicare Other | Source: Skilled Nursing Facility | Attending: *Deleted | Admitting: *Deleted

## 2015-07-09 ENCOUNTER — Other Ambulatory Visit: Payer: Self-pay | Admitting: Internal Medicine

## 2015-07-09 LAB — CBC
HCT: 29.4 % — ABNORMAL LOW (ref 36.0–46.0)
HEMOGLOBIN: 10.3 g/dL — AB (ref 12.0–15.0)
MCH: 29.2 pg (ref 26.0–34.0)
MCHC: 35 g/dL (ref 30.0–36.0)
MCV: 83.3 fL (ref 78.0–100.0)
PLATELETS: 38 10*3/uL — AB (ref 150–400)
RBC: 3.53 MIL/uL — AB (ref 3.87–5.11)
RDW: 23.7 % — ABNORMAL HIGH (ref 11.5–15.5)
WBC: 3 10*3/uL — AB (ref 4.0–10.5)

## 2015-07-09 LAB — BASIC METABOLIC PANEL
ANION GAP: 4 — AB (ref 5–15)
BUN: 28 mg/dL — ABNORMAL HIGH (ref 6–20)
CHLORIDE: 112 mmol/L — AB (ref 101–111)
CO2: 22 mmol/L (ref 22–32)
Calcium: 9.4 mg/dL (ref 8.9–10.3)
Creatinine, Ser: 1.25 mg/dL — ABNORMAL HIGH (ref 0.44–1.00)
GFR calc Af Amer: 45 mL/min — ABNORMAL LOW (ref >60)
GFR, EST NON AFRICAN AMERICAN: 39 mL/min — AB (ref >60)
Glucose, Bld: 80 mg/dL (ref 65–99)
POTASSIUM: 3.7 mmol/L (ref 3.5–5.1)
SODIUM: 138 mmol/L (ref 135–145)

## 2015-07-09 LAB — CORTISOL: CORTISOL PLASMA: 6.9 ug/dL

## 2015-07-10 NOTE — Telephone Encounter (Signed)
Call returned. Patient at [facility] center with multiple problems.

## 2015-07-16 ENCOUNTER — Non-Acute Institutional Stay (SKILLED_NURSING_FACILITY): Payer: Medicare Other | Admitting: Internal Medicine

## 2015-07-16 ENCOUNTER — Encounter: Payer: Self-pay | Admitting: Internal Medicine

## 2015-07-16 DIAGNOSIS — N183 Chronic kidney disease, stage 3 unspecified: Secondary | ICD-10-CM

## 2015-07-16 DIAGNOSIS — E2740 Unspecified adrenocortical insufficiency: Secondary | ICD-10-CM | POA: Diagnosis not present

## 2015-07-16 DIAGNOSIS — D61818 Other pancytopenia: Secondary | ICD-10-CM | POA: Diagnosis not present

## 2015-07-16 NOTE — Assessment & Plan Note (Signed)
Recheck BMET 

## 2015-07-16 NOTE — Assessment & Plan Note (Signed)
07/09/15 platelet count 38,000;ne no bleeding dyscrasias despite the profound thrombocytopenia Recheck CBC

## 2015-07-16 NOTE — Assessment & Plan Note (Signed)
Vertically placed on 10 mg of hydrocortisone; dose will be corrected

## 2015-07-16 NOTE — Patient Instructions (Signed)
New orders for Matrix entry. CBC and differential  BMET  Changes hydrocortisone dose to 5 mg at 8 AM, 1 PM, and 6 PM

## 2015-07-16 NOTE — Progress Notes (Signed)
Patient ID: Eileen Santana, female   DOB: [birth year], 82 y.o.   MRN: [id number]    [facility]:155  Chief Complaint  Patient presents with  . Medical Management of Chronic Issues    Follow up Adrenal Insufficiency   Allergies  Allergen Reactions  . Gabapentin     confusion  . Iohexol Swelling    IV Dye   . Ivp Dye [Iodinated Diagnostic Agents] Swelling    Hives  . Naprosyn [Naproxen] Hives and Itching  . Red Blood Cells Swelling and Dermatitis    With blood transfusion 2012  . Verapamil     Heart block (2nd degree) Takes Cardizem   . Vicodin [Hydrocodone-Acetaminophen] Hives and Itching  . Sulfa Antibiotics Itching and Rash   This is a nursing facility follow up of possible acute adrenal insufficiency, Pancytopenia, and renal insufficiency. Interim medical record and care since last [facility] visit was updated with review of diagnostic studies and change in clinical status since last visit were documented.  HYW:VPXTGGYIRSWNIO 5 mg 3 times a day was initiated because of a fasting cortisol of 6.9 ( 6.7-22.6) despite being on prednisone 7.5 mg daily. Clinically she has been stronger, able to participate in  in physical therapy to greater degree. Pancytopenia has persisted with platelet counts as low as 38,000. Follow-up CBC and differential is pending she has been followed by oncology actively. She's had renal insufficiency. The most  recent creatinine was 1.25 on 7/6.  Review of systems: Her major symptom is weakness in the lower extremities. In fact she slid to the floor while being transferred. There was no musculoskeletal injury. Her lower extremity edema has improved dramatically with wrapping and elevation Despite the low platelet she's had no bleeding dyscrasias. Her cough is essentially resolved. She has no symptoms or signs of upper or lower respiratory tract infection this time Earlier this week she did describe some diarrhea which was  resolved. Epistaxis, hemoptysis, hematuria, melena, or rectal bleeding denied. No unexplained weight loss, significant dyspepsia,dysphagia, or abdominal pain.  There is no abnormal bruising , bleeding, or difficulty stopping bleeding with injury. Constitutional: No fever ENT/mouth: No nasal congestion,  purulent discharge, earache,change in hearing ,sore throat  Cardiovascular: No chest pain, palpitations,paroxysmal nocturnal dyspnea, claudication, edema  Respiratory: No sputum production, DOE , significant snoring,apnea   Dermatologic: No rash, pruritus, change in appearance of skin Allergy/immunology: No itchy/ watery eyes, significant sneezing, urticaria, angioedema  Physical exam:  Pertinent or positive findings: There is marked exotropia of the left eye. She is completely edentulous. The second heart sound is accentuated and intermittently split. She has 1+ pitting edema up to  the knees.  Pedal pulses are decreased. She gave the date as 06/17/2015. She identified the governor.  General appearance:Adequately nourished; no acute distress , increased work of breathing is present.   Lymphatic: No lymphadenopathy about the head, neck, axilla . Eyes: No conjunctival inflammation or lid edema is present. There is no scleral icterus. Ears:  External ear exam shows no significant lesions or deformities.   Nose:  External nasal examination shows no deformity or inflammation. Nasal mucosa are pink and moist without lesions ,exudates Oral exam: lips and gums are healthy appearing.There is no oropharyngeal erythema or exudate . Neck:  No thyromegaly, masses, tenderness noted.    Heart:  Normal rate and regular rhythm. S1  normal without gallop, murmur, click, rub .  Lungs:Chest clear to auscultation without wheezes, rhonchi,rales , rubs. Abdomen:Bowel sounds are normal. Abdomen is soft  and nontender with no organomegaly, hernias,masses. GU: deferred as previously addressed. Extremities:  No  cyanosis, clubbing  Neurologic exam : Balance,Rhomberg,finger to nose testing could not be completed due to clinical state Skin: Warm & dry w/o tenting. No significant lesions or rash.    See summary under each active problem in the Problem List with associated updated therapeutic plan

## 2015-07-17 ENCOUNTER — Encounter (HOSPITAL_COMMUNITY)
Admission: AD | Admit: 2015-07-17 | Discharge: 2015-07-17 | Disposition: A | Discharge status: Home / Self Care | Payer: Medicare Other | Source: Skilled Nursing Facility | Attending: *Deleted | Admitting: *Deleted

## 2015-07-17 ENCOUNTER — Emergency Department (HOSPITAL_COMMUNITY): Payer: Medicare Other

## 2015-07-17 ENCOUNTER — Encounter (HOSPITAL_COMMUNITY): Payer: Self-pay | Admitting: *Deleted

## 2015-07-17 ENCOUNTER — Inpatient Hospital Stay (HOSPITAL_COMMUNITY)
Admission: EM | Admit: 2015-07-17 | Discharge: 2015-07-20 | DRG: 193 | Disposition: A | Discharge status: Skilled Nursing Facility | Payer: Medicare Other | Attending: Internal Medicine | Admitting: Internal Medicine

## 2015-07-17 ENCOUNTER — Other Ambulatory Visit: Payer: Self-pay

## 2015-07-17 ENCOUNTER — Non-Acute Institutional Stay (SKILLED_NURSING_FACILITY): Payer: Medicare Other | Admitting: Internal Medicine

## 2015-07-17 DIAGNOSIS — E872 Acidosis, unspecified: Secondary | ICD-10-CM | POA: Diagnosis present

## 2015-07-17 DIAGNOSIS — R7989 Other specified abnormal findings of blood chemistry: Secondary | ICD-10-CM | POA: Diagnosis not present

## 2015-07-17 DIAGNOSIS — R778 Other specified abnormalities of plasma proteins: Secondary | ICD-10-CM | POA: Diagnosis present

## 2015-07-17 DIAGNOSIS — N179 Acute kidney failure, unspecified: Secondary | ICD-10-CM | POA: Diagnosis present

## 2015-07-17 DIAGNOSIS — Z8261 Family history of arthritis: Secondary | ICD-10-CM

## 2015-07-17 DIAGNOSIS — I422 Other hypertrophic cardiomyopathy: Secondary | ICD-10-CM | POA: Diagnosis present

## 2015-07-17 DIAGNOSIS — Z8673 Personal history of transient ischemic attack (TIA), and cerebral infarction without residual deficits: Secondary | ICD-10-CM

## 2015-07-17 DIAGNOSIS — L98499 Non-pressure chronic ulcer of skin of other sites with unspecified severity: Secondary | ICD-10-CM | POA: Diagnosis present

## 2015-07-17 DIAGNOSIS — I959 Hypotension, unspecified: Secondary | ICD-10-CM | POA: Diagnosis present

## 2015-07-17 DIAGNOSIS — I9589 Other hypotension: Secondary | ICD-10-CM

## 2015-07-17 DIAGNOSIS — Z8262 Family history of osteoporosis: Secondary | ICD-10-CM

## 2015-07-17 DIAGNOSIS — Z8249 Family history of ischemic heart disease and other diseases of the circulatory system: Secondary | ICD-10-CM

## 2015-07-17 DIAGNOSIS — Y95 Nosocomial condition: Secondary | ICD-10-CM | POA: Diagnosis present

## 2015-07-17 DIAGNOSIS — I4892 Unspecified atrial flutter: Secondary | ICD-10-CM

## 2015-07-17 DIAGNOSIS — Z8701 Personal history of pneumonia (recurrent): Secondary | ICD-10-CM

## 2015-07-17 DIAGNOSIS — N289 Disorder of kidney and ureter, unspecified: Secondary | ICD-10-CM

## 2015-07-17 DIAGNOSIS — R531 Weakness: Secondary | ICD-10-CM | POA: Diagnosis not present

## 2015-07-17 DIAGNOSIS — I4891 Unspecified atrial fibrillation: Secondary | ICD-10-CM

## 2015-07-17 DIAGNOSIS — L98429 Non-pressure chronic ulcer of back with unspecified severity: Secondary | ICD-10-CM

## 2015-07-17 DIAGNOSIS — Z79899 Other long term (current) drug therapy: Secondary | ICD-10-CM

## 2015-07-17 DIAGNOSIS — N183 Chronic kidney disease, stage 3 unspecified: Secondary | ICD-10-CM | POA: Diagnosis present

## 2015-07-17 DIAGNOSIS — D61818 Other pancytopenia: Secondary | ICD-10-CM | POA: Diagnosis present

## 2015-07-17 DIAGNOSIS — I248 Other forms of acute ischemic heart disease: Secondary | ICD-10-CM | POA: Diagnosis present

## 2015-07-17 DIAGNOSIS — E2740 Unspecified adrenocortical insufficiency: Secondary | ICD-10-CM | POA: Diagnosis present

## 2015-07-17 DIAGNOSIS — Z825 Family history of asthma and other chronic lower respiratory diseases: Secondary | ICD-10-CM

## 2015-07-17 DIAGNOSIS — N189 Chronic kidney disease, unspecified: Secondary | ICD-10-CM | POA: Diagnosis not present

## 2015-07-17 DIAGNOSIS — I129 Hypertensive chronic kidney disease with stage 1 through stage 4 chronic kidney disease, or unspecified chronic kidney disease: Secondary | ICD-10-CM | POA: Diagnosis present

## 2015-07-17 DIAGNOSIS — R0602 Shortness of breath: Secondary | ICD-10-CM | POA: Diagnosis not present

## 2015-07-17 DIAGNOSIS — I480 Paroxysmal atrial fibrillation: Secondary | ICD-10-CM | POA: Diagnosis present

## 2015-07-17 DIAGNOSIS — J189 Pneumonia, unspecified organism: Secondary | ICD-10-CM | POA: Diagnosis not present

## 2015-07-17 DIAGNOSIS — F17210 Nicotine dependence, cigarettes, uncomplicated: Secondary | ICD-10-CM | POA: Diagnosis present

## 2015-07-17 DIAGNOSIS — K754 Autoimmune hepatitis: Secondary | ICD-10-CM | POA: Diagnosis present

## 2015-07-17 DIAGNOSIS — M069 Rheumatoid arthritis, unspecified: Secondary | ICD-10-CM | POA: Diagnosis present

## 2015-07-17 DIAGNOSIS — Z823 Family history of stroke: Secondary | ICD-10-CM | POA: Diagnosis not present

## 2015-07-17 DIAGNOSIS — G9340 Encephalopathy, unspecified: Secondary | ICD-10-CM | POA: Diagnosis present

## 2015-07-17 DIAGNOSIS — L89152 Pressure ulcer of sacral region, stage 2: Secondary | ICD-10-CM

## 2015-07-17 DIAGNOSIS — Z7952 Long term (current) use of systemic steroids: Secondary | ICD-10-CM

## 2015-07-17 DIAGNOSIS — I509 Heart failure, unspecified: Secondary | ICD-10-CM

## 2015-07-17 LAB — CBC WITH DIFFERENTIAL/PLATELET
Basophils Absolute: 0 10*3/uL (ref 0.0–0.1)
Basophils Relative: 2 %
EOS ABS: 0.1 10*3/uL (ref 0.0–0.7)
EOS PCT: 3 %
HCT: 28.6 % — ABNORMAL LOW (ref 36.0–46.0)
HEMOGLOBIN: 9.9 g/dL — AB (ref 12.0–15.0)
LYMPHS ABS: 0.2 10*3/uL — AB (ref 0.7–4.0)
LYMPHS PCT: 7 %
MCH: 29.2 pg (ref 26.0–34.0)
MCHC: 34.6 g/dL (ref 30.0–36.0)
MCV: 84.4 fL (ref 78.0–100.0)
MONOS PCT: 15 %
Monocytes Absolute: 0.4 10*3/uL (ref 0.1–1.0)
Neutro Abs: 2 10*3/uL (ref 1.7–7.7)
Neutrophils Relative %: 73 %
Platelets: 28 10*3/uL — CL (ref 150–400)
RBC: 3.39 MIL/uL — ABNORMAL LOW (ref 3.87–5.11)
RDW: 24.4 % — ABNORMAL HIGH (ref 11.5–15.5)
WBC: 2.7 10*3/uL — ABNORMAL LOW (ref 4.0–10.5)

## 2015-07-17 LAB — HEPATIC FUNCTION PANEL
ALT: 42 U/L (ref 14–54)
AST: 264 U/L — AB (ref 15–41)
Albumin: 2.4 g/dL — ABNORMAL LOW (ref 3.5–5.0)
Alkaline Phosphatase: 76 U/L (ref 38–126)
BILIRUBIN INDIRECT: 1.7 mg/dL — AB (ref 0.3–0.9)
BILIRUBIN TOTAL: 3.2 mg/dL — AB (ref 0.3–1.2)
Bilirubin, Direct: 1.5 mg/dL — ABNORMAL HIGH (ref 0.1–0.5)
Total Protein: 5 g/dL — ABNORMAL LOW (ref 6.5–8.1)

## 2015-07-17 LAB — BASIC METABOLIC PANEL
Anion gap: 5 (ref 5–15)
BUN: 41 mg/dL — AB (ref 6–20)
CHLORIDE: 111 mmol/L (ref 101–111)
CO2: 20 mmol/L — ABNORMAL LOW (ref 22–32)
CREATININE: 2.31 mg/dL — AB (ref 0.44–1.00)
Calcium: 9.3 mg/dL (ref 8.9–10.3)
GFR calc Af Amer: 21 mL/min — ABNORMAL LOW (ref >60)
GFR calc non Af Amer: 19 mL/min — ABNORMAL LOW (ref >60)
GLUCOSE: 82 mg/dL (ref 65–99)
POTASSIUM: 4 mmol/L (ref 3.5–5.1)
SODIUM: 136 mmol/L (ref 135–145)

## 2015-07-17 LAB — URINALYSIS, ROUTINE W REFLEX MICROSCOPIC
GLUCOSE, UA: NEGATIVE mg/dL
HGB URINE DIPSTICK: NEGATIVE
LEUKOCYTES UA: NEGATIVE
Nitrite: NEGATIVE
PH: 5 (ref 5.0–8.0)
PROTEIN: NEGATIVE mg/dL
SPECIFIC GRAVITY, URINE: 1.02 (ref 1.005–1.030)

## 2015-07-17 LAB — LACTIC ACID, PLASMA: LACTIC ACID, VENOUS: 3 mmol/L — AB (ref 0.5–1.9)

## 2015-07-17 LAB — TROPONIN I
TROPONIN I: 0.22 ng/mL — AB (ref <0.03)
Troponin I: 0.19 ng/mL (ref <0.03)

## 2015-07-17 LAB — LIPASE, BLOOD: Lipase: 19 U/L (ref 11–51)

## 2015-07-17 LAB — BRAIN NATRIURETIC PEPTIDE: B NATRIURETIC PEPTIDE 5: 709 pg/mL — AB (ref 0.0–100.0)

## 2015-07-17 LAB — PROCALCITONIN: Procalcitonin: 0.56 ng/mL

## 2015-07-17 MED ORDER — ENSURE ENLIVE PO LIQD
237.0000 mL | Freq: Two times a day (BID) | ORAL | Status: DC
  Administered 2015-07-18 – 2015-07-19 (×4): 237 mL via ORAL

## 2015-07-17 MED ORDER — SODIUM CHLORIDE 0.9 % IV BOLUS (SEPSIS): 1000.0000 mL | Freq: Once | INTRAVENOUS | Status: DC

## 2015-07-17 MED ORDER — HYDROCORTISONE NA SUCCINATE PF 100 MG IJ SOLR
100.0000 mg | Freq: Once | INTRAMUSCULAR | Status: AC
  Administered 2015-07-17: 100 mg via INTRAVENOUS
  Filled 2015-07-17: qty 2

## 2015-07-17 MED ORDER — DEXTROSE 5 % IV SOLN
INTRAVENOUS | Status: AC
  Filled 2015-07-17 (×2): qty 1

## 2015-07-17 MED ORDER — LATANOPROST 0.005 % OP SOLN
OPHTHALMIC | Status: AC
  Filled 2015-07-17: qty 2.5

## 2015-07-17 MED ORDER — DEXTROSE 5 % IV SOLN
1.0000 g | INTRAVENOUS | Status: DC
  Administered 2015-07-17 – 2015-07-19 (×3): 1 g via INTRAVENOUS
  Filled 2015-07-17 (×4): qty 1

## 2015-07-17 MED ORDER — BISACODYL 10 MG RE SUPP: 10.0000 mg | RECTAL | Status: DC | PRN

## 2015-07-17 MED ORDER — ACETAMINOPHEN 325 MG PO TABS
650.0000 mg | ORAL_TABLET | Freq: Four times a day (QID) | ORAL | Status: DC | PRN
  Administered 2015-07-17 – 2015-07-19 (×3): 650 mg via ORAL
  Filled 2015-07-17 (×3): qty 2

## 2015-07-17 MED ORDER — DILTIAZEM HCL ER BEADS 240 MG PO CP24
360.0000 mg | ORAL_CAPSULE | Freq: Every day | ORAL | Status: DC
  Administered 2015-07-18: 360 mg via ORAL
  Filled 2015-07-17 (×4): qty 1

## 2015-07-17 MED ORDER — LATANOPROST 0.005 % OP SOLN
1.0000 [drp] | Freq: Every day | OPHTHALMIC | Status: DC
  Administered 2015-07-17 – 2015-07-19 (×3): 1 [drp] via OPHTHALMIC
  Filled 2015-07-17: qty 2.5

## 2015-07-17 MED ORDER — POLYETHYLENE GLYCOL 3350 17 G PO PACK
17.0000 g | PACK | Freq: Every day | ORAL | Status: DC
  Filled 2015-07-17: qty 1

## 2015-07-17 MED ORDER — URSODIOL 300 MG PO CAPS
300.0000 mg | ORAL_CAPSULE | Freq: Two times a day (BID) | ORAL | Status: DC
  Administered 2015-07-17 – 2015-07-19 (×5): 300 mg via ORAL
  Filled 2015-07-17 (×10): qty 1

## 2015-07-17 MED ORDER — URSODIOL 300 MG PO CAPS
ORAL_CAPSULE | ORAL | Status: AC
  Filled 2015-07-17: qty 1

## 2015-07-17 MED ORDER — FEBUXOSTAT 40 MG PO TABS
40.0000 mg | ORAL_TABLET | Freq: Every day | ORAL | Status: DC
  Administered 2015-07-18 – 2015-07-19 (×2): 40 mg via ORAL
  Filled 2015-07-17 (×5): qty 1

## 2015-07-17 MED ORDER — COLCHICINE 0.6 MG PO TABS
0.6000 mg | ORAL_TABLET | Freq: Every day | ORAL | Status: DC
  Administered 2015-07-18 – 2015-07-20 (×3): 0.6 mg via ORAL
  Filled 2015-07-17 (×3): qty 1

## 2015-07-17 MED ORDER — VANCOMYCIN HCL IN DEXTROSE 1-5 GM/200ML-% IV SOLN
1000.0000 mg | Freq: Once | INTRAVENOUS | Status: AC
  Administered 2015-07-17: 1000 mg via INTRAVENOUS
  Filled 2015-07-17: qty 200

## 2015-07-17 MED ORDER — HYDROCORTISONE NA SUCCINATE PF 100 MG IJ SOLR
100.0000 mg | Freq: Three times a day (TID) | INTRAMUSCULAR | Status: DC
  Administered 2015-07-18 (×2): 100 mg via INTRAVENOUS
  Filled 2015-07-17 (×2): qty 2

## 2015-07-17 MED ORDER — PANTOPRAZOLE SODIUM 40 MG PO TBEC
40.0000 mg | DELAYED_RELEASE_TABLET | Freq: Two times a day (BID) | ORAL | Status: DC
  Administered 2015-07-17 – 2015-07-19 (×5): 40 mg via ORAL
  Filled 2015-07-17 (×5): qty 1

## 2015-07-17 MED ORDER — SODIUM CHLORIDE 0.9 % IV BOLUS (SEPSIS)
1000.0000 mL | Freq: Once | INTRAVENOUS | Status: AC
  Administered 2015-07-17: 1000 mL via INTRAVENOUS

## 2015-07-17 MED ORDER — RISPERIDONE 0.5 MG PO TABS
0.2500 mg | ORAL_TABLET | Freq: Every day | ORAL | Status: DC
  Administered 2015-07-17 – 2015-07-19 (×3): 0.25 mg via ORAL
  Filled 2015-07-17 (×3): qty 1

## 2015-07-17 MED ORDER — PIPERACILLIN-TAZOBACTAM 3.375 G IVPB 30 MIN
3.3750 g | Freq: Once | INTRAVENOUS | Status: AC
  Administered 2015-07-17: 3.375 g via INTRAVENOUS
  Filled 2015-07-17: qty 50

## 2015-07-17 MED ORDER — SODIUM CHLORIDE 0.9 % IV BOLUS (SEPSIS)
500.0000 mL | Freq: Once | INTRAVENOUS | Status: AC
  Administered 2015-07-17: 500 mL via INTRAVENOUS

## 2015-07-17 MED ORDER — VANCOMYCIN HCL IN DEXTROSE 750-5 MG/150ML-% IV SOLN
750.0000 mg | INTRAVENOUS | Status: DC
  Administered 2015-07-18 – 2015-07-19 (×2): 750 mg via INTRAVENOUS
  Filled 2015-07-17 (×3): qty 150

## 2015-07-17 MED ORDER — SODIUM CHLORIDE 0.9 % IV BOLUS (SEPSIS): 2000.0000 mL | Freq: Once | INTRAVENOUS | Status: DC

## 2015-07-17 NOTE — ED Provider Notes (Addendum)
CSN: [id number]     Arrival date & time 07/17/15  1324 History   First MD Initiated Contact with Patient 07/17/15 1408     Chief Complaint  Patient presents with  . Weakness      The history is provided by medical records, the patient and a relative.  pt brought to ER from nursing home for increasing generalized weakness over the past two weeks. Currently receiving rehab at the [facility] after a hospitalization in June 2017 for UTI. Hx of autoimmune hepatitis on chronic steroids and azathioprine. Denies CP. Reports generalized weakness.  Hx of paroxysmal atrial flutter not on anticoagulation secondary to prior UGI bleed. Hx of renal insufficiency and anemia. Hx of hypertrophinc cardiomyopathy. No urinary symptoms at this time. Denies n/v/d.     Past Medical History  Diagnosis Date  . History of GI bleed     Associated with NSAIDS  . Iron deficiency anemia     Transfusion dependent  . DJD (degenerative joint disease)   . Essential hypertension, benign   . History of colitis   . Ileitis   . Pancytopenia   . Autoimmune hepatitis (HCC)     With leukocytoclastic vasculitis  . Heart block AV second degree March 2013    a. Cardiology consult note 06/2013: "Question of previous second degree heart block, although review of cardiology notes indicates that this may have been actually blocked PACs when the patient was on verapamil."  . Bronchitis   . Steroid-induced myopathy   . Renal calculus 08/12/2011  . Rheumatoid arthritis(714.0)   . Colon ulcer 04/2010    NSAID related  . Gastric ulcer 04/2010    NSAID related  . UGI bleed 09/20/2011    Focal area of gastritis oozing of blood  . Gastric AVM 09/20/2011  . Candida esophagitis (HCC) 09/20/2011  . Paroxysmal atrial fibrillation (HCC)     Not on anticoag due to PMH GIB/AVM  . Cholestatic hepatitis   . Paroxysmal atrial flutter (HCC)     Not on anticoag 2/2 hx of GIB/AVM - dx 06/2013.  . Multifocal atrial tachycardia (HCC)   .  Hypertrophic cardiomyopathy (HCC)     Echo 03/2011: severe LVH suggesting possble infiltrate cardiomyopathy or advanced hypertensive heart disease, increased  echogenicity of the ventricular septum as well as the pericardium, EF >70% with end systolicmid-cavitary obliteration of the ventricle, stage 1 DD, mild MR, small IVC.  Marland Kitchen Hypomagnesemia   . Chronic renal disease, stage 3, moderately decreased glomerular filtration rate between 30-59 mL/min/1.73 square meter 02/03/2014  . Anemia of chronic renal failure, stage 3 (moderate) 02/03/2014  . Low back pain   . 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 Biatrial enlargement 04/04/2014  . History of pneumonia 12/2013  . Atrial fibrillation (HCC)   . Neuropathic pain     legs  . Pancytopenia [facility])    Past Surgical History  Procedure Laterality Date  . Colonoscopy  04/2010  . Upper gastrointestinal endoscopy  04/2010  . Esophagogastroduodenoscopy  09/20/2011    Status post APC.  Marland Kitchen Esophagogastroduodenoscopy  09/20/2011    Procedure: ESOPHAGOGASTRODUODENOSCOPY (EGD);  Surgeon: Malissa Hippo, MD;  Location: [facility];  Service: Endoscopy;  Laterality: N/A;  . Cyst removal hand      Elbow  . Sebaceous cyst removed      bilateral elbows  . Colonoscopy N/A 07/04/2014    Procedure: COLONOSCOPY;  Surgeon: Malissa Hippo, MD;  Location: [facility];  Service: Endoscopy;  Laterality:  N/A;  730  . Esophagogastroduodenoscopy N/A 07/04/2014    Procedure: ESOPHAGOGASTRODUODENOSCOPY (EGD);  Surgeon: Malissa Hippo, MD;  Location: [facility];  Service: Endoscopy;  Laterality: N/A;  . Givens capsule study N/A 07/14/2014    Procedure: GIVENS CAPSULE STUDY;  Surgeon: Malissa Hippo, MD;  Location: [facility];  Service: Endoscopy;  Laterality: N/A;  730   Family History  Problem Relation Age of Onset  . Stroke Mother   . Heart disease Mother 70  . Hypertension Sister   . Hypertension Brother   . Heart disease Father 95  . COPD Brother   . Arthritis  Brother   . Osteoporosis Sister   . Cancer Neg Hx   . Diabetes Neg Hx    Social History  Substance Use Topics  . Smoking status: Current Some Day Smoker -- 0.25 packs/day for 50 years    Types: Cigarettes    Start date: 04/10/1945  . Smokeless tobacco: Never Used     Comment: 1/3 ppd; stopped for @ least 8 years  . Alcohol Use: No   OB History    Gravida Para Term Preterm AB TAB SAB Ectopic Multiple Living   4 4 4       4      Review of Systems  All other systems reviewed and are negative.     Allergies  Gabapentin; Iohexol; Ivp dye; Naprosyn; Red blood cells; Verapamil; Vicodin; and Sulfa antibiotics  Home Medications   Prior to Admission medications   Medication Sig Start Date End Date Taking? Authorizing Provider  acetaminophen (TYLENOL) 325 MG tablet Take 650 mg by mouth every 6 (six) hours as needed. For pain   Yes Historical Provider, MD  azaTHIOprine (IMURAN) 50 MG tablet TAKE 1 AND 1/2 TABLETS BY MOUTH ONCE DAILY Patient taking differently: Take one tablet by mouth once daily 03/06/15  Yes 05/06/15, NP  bisacodyl (DULCOLAX) 10 MG suppository Place 1 suppository (10 mg total) rectally as needed for moderate constipation. 05/12/15  Yes 07/12/15, MD  Cholecalciferol (VITAMIN D) 2000 UNITS tablet Take 2,000 Units by mouth daily.   Yes Historical Provider, MD  colchicine 0.6 MG tablet Take one tablet by mouth once daily   Yes Historical Provider, MD  diltiazem (TIAZAC) 360 MG 24 hr capsule Take 1 capsule (360 mg total) by mouth daily. 01/06/14  Yes 03/07/14, MD  febuxostat (ULORIC) 40 MG tablet Take 40 mg by mouth daily.   Yes Historical Provider, MD  folic acid (FOLVITE) 400 MCG tablet Take 400 mcg by mouth daily.    Yes Historical Provider, MD  furosemide (LASIX) 20 MG tablet Take 20 mg by mouth daily as needed for fluid. Take one tablet by mouth once daily as needed if weight is up 3 pounds.   Yes Historical Provider, MD  guaiFENesin-dextromethorphan  (ROBITUSSIN DM) 100-10 MG/5ML syrup Take 5 mLs by mouth every 6 (six) hours as needed for cough. 07/04/15  Yes 09/04/15, MD  hydrocortisone (CORTEF) 10 MG tablet Take one tablet by mouth three times daily to prevent adrenal crisis   Yes Historical Provider, MD  lidocaine-prilocaine (EMLA) cream Apply 1 application topically as needed (pain).  06/20/14  Yes Historical Provider, MD  metoprolol (LOPRESSOR) 50 MG tablet Take 50 mg by mouth 2 (two) times daily.   Yes Historical Provider, MD  Nutritional Supplements (ENSURE CLEAR PO) Take twice daily between meals   Yes Historical Provider, MD  pantoprazole (PROTONIX) 40 MG tablet  Take 1 tablet (40 mg total) by mouth 2 (two) times daily. 07/04/15  Yes Richarda Overlie, MD  polyethylene glycol (MIRALAX / GLYCOLAX) packet Take 17 g by mouth daily. 03/17/15  Yes Marinda Elk, MD  Probiotic Product (RISA-BID PROBIOTIC) TABS Take one tablet by mouth once daily   Yes Historical Provider, MD  Rectal Cleansers (FLEET [employer]UR[employer] CLEANSING ENEMA RE) As needed 1 max a day prn constipation   Yes Historical Provider, MD  risperiDONE (RISPERDAL) 0.25 MG tablet Take 0.25 mg by mouth at bedtime. Hold for sedation   Yes Historical Provider, MD  Travoprost, BAK Free, (TRAVATAN) 0.004 % SOLN ophthalmic solution Place 1 drop into both eyes at bedtime.    Yes Historical Provider, MD  ursodiol (ACTIGALL) 300 MG capsule Take 300 mg by mouth 2 (two) times daily.   Yes Historical Provider, MD   BP 100/67 mmHg  Pulse 60  Temp(Src) 97.1 F (36.2 C) (Rectal)  Resp 20  SpO2 96% Physical Exam  Constitutional: She is oriented to person, place, and time. She appears well-developed and well-nourished. No distress.  HENT:  Head: Normocephalic and atraumatic.  Eyes: EOM are normal.  Neck: Normal range of motion.  Cardiovascular: Normal rate, regular rhythm and normal heart sounds.   Pulmonary/Chest: Effort normal. She has rales.  Abdominal: Soft. She exhibits no distension. There  is no tenderness.  Musculoskeletal: Normal range of motion. She exhibits edema.  Neurological: She is alert and oriented to person, place, and time.  Skin: Skin is warm and dry.  Psychiatric: She has a normal mood and affect. Judgment normal.  Nursing note and vitals reviewed.   ED Course  Procedures (including critical care time) Labs Review Labs Reviewed  TROPONIN I - Abnormal; Notable for the following:    Troponin I 0.22 (*)    All other components within normal limits  URINALYSIS, ROUTINE W REFLEX MICROSCOPIC (NOT AT [facility]) - Abnormal; Notable for the following:    Bilirubin Urine SMALL (*)    Ketones, ur TRACE (*)    All other components within normal limits  HEPATIC FUNCTION PANEL - Abnormal; Notable for the following:    Total Protein 5.0 (*)    Albumin 2.4 (*)    AST 264 (*)    Total Bilirubin 3.2 (*)    Bilirubin, Direct 1.5 (*)    Indirect Bilirubin 1.7 (*)    All other components within normal limits  LACTIC ACID, PLASMA - Abnormal; Notable for the following:    Lactic Acid, Venous 3.0 (*)    All other components within normal limits  BRAIN NATRIURETIC PEPTIDE - Abnormal; Notable for the following:    B Natriuretic Peptide 709.0 (*)    All other components within normal limits  URINE CULTURE  LIPASE, BLOOD   BUN  Date Value Ref Range Status  07/17/2015 41* 6 - 20 mg/dL Final  75/10/2583 28* 6 - 20 mg/dL Final  27/78/2423 27* 6 - 20 mg/dL Final  53/61/4431 37* 6 - 20 mg/dL Final   CREAT  Date Value Ref Range Status  03/18/2014 1.37* 0.50 - 1.10 mg/dL Final  54/00/8676 1.95 0.50 - 1.10 mg/dL Final  09/32/6712 4.58 0.50 - 1.10 mg/dL Final  09/98/3382 5.05 0.50 - 1.10 mg/dL Final   CREATININE, SER  Date Value Ref Range Status  07/17/2015 2.31* 0.44 - 1.00 mg/dL Final  39/76/7341 9.37* 0.44 - 1.00 mg/dL Final  90/24/0973 5.32* 0.44 - 1.00 mg/dL Final  99/24/2683 4.19* 0.44 - 1.00 mg/dL Final  Imaging Review Dg Chest 2 View  07/17/2015  CLINICAL  DATA:  Atrial fibrillation and hypertension. Shortness of breath. EXAM: CHEST  2 VIEW COMPARISON:  July 05, 2015 FINDINGS: There is persistent cardiomegaly with pulmonary venous hypertension. There is a loculated right pleural effusion. There is mild interstitial edema. There is patchy airspace opacity in the right lower lobe. There is atherosclerotic calcification aorta. There is no apparent adenopathy. No bone lesions are evident. IMPRESSION: Evidence of congestive heart failure. Suspect superimposed pneumonia right lower lobe. Aortic atherosclerosis. Electronically Signed   By: Bretta Bang III M.D.   On: 07/17/2015 15:50   I have personally reviewed and evaluated these images and lab results as part of my medical decision-making.   EKG Interpretation   Date/Time:  Friday July 17 2015 13:47:25 EDT Ventricular Rate:  98 PR Interval:    QRS Duration: 97 QT Interval:  383 QTC Calculation: 464 R Axis:   -5 Text Interpretation:  Atrial flutter with predominant 3:1 AV block  Probable anterior infarct, age indeterminate No significant change was  found Confirmed by Jennah Satchell  MD, Caryn Bee ([id number]) on 07/17/2015 2:51:05 PM      MDM   Final diagnoses:  None    Hx of autoimmune hepatitis. LFTs baseline for patient. Suspect CHF related to atrial flutter. Borderline BP likely secondary to CHF. Will consider gentle diuresis. Troponin likely secondary to CHF. Question PNA in RLL. Will cover with HCAP abx. Borderline soft BP could be secondary to chronic steroid use. Will send cortisol level at this time. Strong consideration for stress dose steroids, will discuss with admitting team. Pt with acute on chronic renal failure.     Azalia Bilis, MD 07/17/15 1744  Azalia Bilis, MD 07/17/15 1747  Azalia Bilis, MD 07/17/15 1755

## 2015-07-17 NOTE — ED Notes (Addendum)
CRITICAL VALUE ALERT  Critical value received:  Lactic acid = 3.0     Troponin = 0.22  Date of notification:  07/17/15  Time of notification:  1552  Critical value read back:Yes.    Nurse who received alert:  Antony Odea, RN  MD notified (1st page):  Dr. Patria Mane  Time of first page:  1553  MD notified (2nd page):  Time of second page:  Responding MD:    Time MD responded:

## 2015-07-17 NOTE — Progress Notes (Signed)
Patient ID: Naliya Gish, female   DOB: March 09, 1930, 82 y.o.   MRN: [id number]      This is an acute visit.  Level care skilled.  Facility [facility].    Chief complaint- Acute visit secondary to renal insufficiency-thrombocytopenia-tachycardia-increased weakness-hypotension  History of present illness.  Patient is a pleasant 82 year old female with a very complicated medical history.--She was recently hospitalized for healthcare associated pneumonia and has completed antibiotics.  She also has a history of suspected congestive heart failure.  As well as suspicion of some ascites in the past.  She also has a history of significant pancytopenia with platelets recently around 40,000.  Labs today have shown some regression here with platelet count of 28,000.  She is followed by hematology as well.  Hemoglobin relatively stable at 9.9.  Labs also no decreased renal insufficiency creatinine of 2.31 BUN of 41 on lab done today previous one last week was 1.25.  Nursing staff also also noted some hypotensive readings with systolics in the 90s-in fact it was in the 90s on exam today she is also significantly tachycardic with a pulse rate ranging between 110 and 120 she does have a history of atrial fibrillation is on Lopressor 50 mg twice a day-appears at times her systolic blood pressure however runs low.  Per her daughter she has   In the past been on Lopressor 25 mg twice a day  She currently is complaining of weakness but does not complain of chest pain or acute shortness of breath while signs her most remarkable for again elevated pulse rate and systolic blood pressure in the 90s        Previous medical history.  History UTIs-peripheral neuropathy.  Hypertension.  Chronic back pain.  History of elevated lactic acidosis.  Acute renal failure superimposed on stage III chronic disease.  Chronic cough.  Digital joint disease.  Second degree heart  block.  Rheumatoid arthritis.  Atrial fibrillation not on chronic coagulation secondary to GI bleed  Steroid-induced myopathy.   Family medical social history reviewed  per history and physical on 06/18/2015  Medications.include   Tylenol 650 mg every 6 hours when necessary.    Colchicine 0.6 mg daily U Lori 40 mg daily.  Diltiazem ER 360 mg daily.  Dulcolax 10 mg once a day when necessary.  Vitamin D 2000 units daily . Protonix 40 mg twice a day Lasix mg daily as needed . Ridperdal 0.25 mg daily at bedtime Hydrocortisone 10 mg 3 times a day Lopressor 50 mg twice a day. Per family this had previously been 25 mg twice a day           Review of systems this is limited secondary patient being somewhat poor historian and not speaking much.  In general is not complaining of fever or chills.  Respiratory is not complaining of acute shortness of breath does have a cough.at times  Cardiac is not specifically complaining of chest pain.  GI is not complaining stomach pain there's been no nausea or vomiting to my knowledge.  Musculoskeletal is not complaining of acute joint pain other than generalized weakness at this point does have a history of pain in her legs.  Neurologic is not complaining of dizziness headache or syncopal-type feelings.  Psych she appears somewhat anxious and tearful  Physical exam.  She is afebrile pulse between 101 20 respirations 20 blood pressure notable for systolic in the 90s   In general this is a frail elderly female no distress but appears  quite weak and frail  She is alert and responsive very weak appearing.  Her skin is warm and dry.  Oropharynx Oropharynx is clear.  Chest  No labored breathing shallow air entry with poor respiratory effort some slight congestion? At bases.  Heart is regular irregular rate and rhythm pulse of 100--120she has I would say 1-2 plus lower extremity edema  GU could not appreciate overt  suprapubic tenderness.  GI abdomen somewhat protuberant it is soft there are positive bowel sounds Does not appear to have acute tenderness to palpation  Musculoskeletal is able to move all extremities 4 but limited exam since patient is in bed.  Neurologic cannot really appreciating lateralizing findings her speech is clear but she is not speaking much cranial nerves appear grossly intact.  Psych  She is pleasant and cooperative with exam but is somewhat tearful and anxious   Labs.   14 2017.  WBC 2.7 hemoglobin 9.9 platelets 28,000.  Sodium 136 potassium 4 BUN 41 creatinine 2.31.  Assessment and plan.  Weakness complicated with worsening thrombocytopenia renal insufficiency -with tachycardia-patient does not appear to be in acute distress however labs including renal function and platelet count appear to be deteriorating somewhat-she also appears quite weak-blood pressure also was somewhat low with systolic in the 90s again she is on Lopressor apparently 50 mg twice a day which may be contributing to this.  I did discuss this with her daughters in the room as well as with her daughter via phone-I also did speak with the hematology physician assistant about her status-will send her to the ER for evaluation of multiple issues as stated above.  [id number]

## 2015-07-17 NOTE — Progress Notes (Signed)
Pharmacy Antibiotic Note  Eileen Santana is a 82 y.o. female admitted on 07/17/2015 with pneumonia.  Pharmacy has been consulted for Vancomycin dosing and renal adjust antibiotics as needed.  Plan:  Vancomycin 1000mg  x 1 (given) then 750mg  IV q24h Check trough at steady state Continue Cefepime 1gm IV q24hrs Monitor labs, renal fxn, progress and c/s Deescalate ABX when improved / appropriate.    Height: 5\' 5"  (165.1 cm) Weight: 161 lb 13.1 oz (73.4 kg) IBW/kg (Calculated) : 57  Temp (24hrs), Avg:97.2 F (36.2 C), Min:97.1 F (36.2 C), Max:97.5 F (36.4 C)   Recent Labs Lab 07/17/15 0823 07/17/15 1507  WBC 2.7*  --   CREATININE 2.31*  --   LATICACIDVEN  --  3.0*    Estimated Creatinine Clearance: 18.9 mL/min (by C-G formula based on Cr of 2.31).    Allergies  Allergen Reactions  . Gabapentin     confusion  . Iohexol Swelling    IV Dye   . Ivp Dye [Iodinated Diagnostic Agents] Swelling    Hives  . Naprosyn [Naproxen] Hives and Itching  . Red Blood Cells Swelling and Dermatitis    With blood transfusion 2012  . Verapamil     Heart block (2nd degree) Takes Cardizem   . Vicodin [Hydrocodone-Acetaminophen] Hives and Itching  . Sulfa Antibiotics Itching and Rash   Antimicrobials this admission: Vancomycin 7/14 >>  Cefepime 7/14 >>  Zosyn 3.375gm x 1 on 7/14  Dose adjustments this admission: n/a  No results found for this or any previous visit (from the past 240 hour(s)).  Thank you for allowing pharmacy to be a part of this patient's care.  8/14 A 07/17/2015 10:40 PM

## 2015-07-17 NOTE — ED Notes (Signed)
Patient transported to X-ray 

## 2015-07-17 NOTE — H&P (Addendum)
History and Physical  Eileen Santana BPZ:[id number] DOB: 1930-07-14 DOA: 07/17/2015  Referring physician: Dr Patria Mane, ED physician PCP: [name], MD  Outpatient Specialists:    Dr. Karilyn Cota  Dr Galen Manila  Dr Purvis Sheffield  Chief Complaint: Weakness, Shortness of breath  HPI: Eileen Santana is a 82 y.o. female with a history of GI bleeds, essential hypertension, DJD, second degree heart block, rheumatoid arthritis, PAF (CHADS 2 VASC score of 4) not on anticoagulation secondary to GI bleeds, stage III chronic kidney disease, steroid-induced myopathy, feet pain, ambulation with walker secondary to feet pain.  She is currently at the [facility] after being hospitalized twice in the past month, first from a UTI with lactic acidosis and acute renal failure, the second with healthcare associated pneumonia from 6/28 - 7/1. Over the past couple of weeks, the patient has become weaker. Her blood pressure was in the 80s systolically upon arrival, over the patient does not complain of any fevers, chills, nausea, vomiting. She denies cough. She did tell the emergency doctor that she had some mild shortness of breath occasionally.   Review of Systems:   Pt denies any fevers, chills, nausea, vomiting, diarrhea, constipation, abdominal pain, shortness of breath, dyspnea on exertion, orthopnea, cough, wheezing, palpitations, headache, vision changes, lightheadedness, dizziness, melena, rectal bleeding.  Review of systems are otherwise negative  Past Medical History  Diagnosis Date  . History of GI bleed     Associated with NSAIDS  . Iron deficiency anemia     Transfusion dependent  . DJD (degenerative joint disease)   . Essential hypertension, benign   . History of colitis   . Ileitis   . Pancytopenia   . Autoimmune hepatitis (HCC)     With leukocytoclastic vasculitis  . Heart block AV second degree March 2013    a. Cardiology consult note 06/2013: "Question of previous second degree heart block,  although review of cardiology notes indicates that this may have been actually blocked PACs when the patient was on verapamil."  . Bronchitis   . Steroid-induced myopathy   . Renal calculus 08/12/2011  . Rheumatoid arthritis(714.0)   . Colon ulcer 04/2010    NSAID related  . Gastric ulcer 04/2010    NSAID related  . UGI bleed 09/20/2011    Focal area of gastritis oozing of blood  . Gastric AVM 09/20/2011  . Candida esophagitis (HCC) 09/20/2011  . Paroxysmal atrial fibrillation (HCC)     Not on anticoag due to PMH GIB/AVM  . Cholestatic hepatitis   . Paroxysmal atrial flutter (HCC)     Not on anticoag 2/2 hx of GIB/AVM - dx 06/2013.  . Multifocal atrial tachycardia (HCC)   . Hypertrophic cardiomyopathy (HCC)     Echo 03/2011: severe LVH suggesting possble infiltrate cardiomyopathy or advanced hypertensive heart disease, increased  echogenicity of the ventricular septum as well as the pericardium, EF >70% with end systolicmid-cavitary obliteration of the ventricle, stage 1 DD, mild MR, small IVC.  Marland Kitchen Hypomagnesemia   . Chronic renal disease, stage 3, moderately decreased glomerular filtration rate between 30-59 mL/min/1.73 square meter 02/03/2014  . Anemia of chronic renal failure, stage 3 (moderate) 02/03/2014  . Low back pain   . 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 Biatrial enlargement 04/04/2014  . History of pneumonia 12/2013  . Atrial fibrillation (HCC)   . Neuropathic pain     legs  . Pancytopenia [facility])    Past Surgical History  Procedure Laterality Date  . Colonoscopy  04/2010  .  Upper gastrointestinal endoscopy  04/2010  . Esophagogastroduodenoscopy  09/20/2011    Status post APC.  Marland Kitchen Esophagogastroduodenoscopy  09/20/2011    Procedure: ESOPHAGOGASTRODUODENOSCOPY (EGD);  Surgeon: Malissa Hippo, MD;  Location: [facility];  Service: Endoscopy;  Laterality: N/A;  . Cyst removal hand      Elbow  . Sebaceous cyst removed      bilateral elbows  . Colonoscopy N/A 07/04/2014     Procedure: COLONOSCOPY;  Surgeon: Malissa Hippo, MD;  Location: [facility];  Service: Endoscopy;  Laterality: N/A;  730  . Esophagogastroduodenoscopy N/A 07/04/2014    Procedure: ESOPHAGOGASTRODUODENOSCOPY (EGD);  Surgeon: Malissa Hippo, MD;  Location: [facility];  Service: Endoscopy;  Laterality: N/A;  . Givens capsule study N/A 07/14/2014    Procedure: GIVENS CAPSULE STUDY;  Surgeon: Malissa Hippo, MD;  Location: [facility];  Service: Endoscopy;  Laterality: N/A;  730   Social History:  reports that she has been smoking Cigarettes.  She started smoking about 70 years ago. She has a 12.5 pack-year smoking history. She has never used smokeless tobacco. She reports that she does not drink alcohol or use illicit drugs. Patient lives at [facility] currently  Allergies  Allergen Reactions  . Gabapentin     confusion  . Iohexol Swelling    IV Dye   . Ivp Dye [Iodinated Diagnostic Agents] Swelling    Hives  . Naprosyn [Naproxen] Hives and Itching  . Red Blood Cells Swelling and Dermatitis    With blood transfusion 2012  . Verapamil     Heart block (2nd degree) Takes Cardizem   . Vicodin [Hydrocodone-Acetaminophen] Hives and Itching  . Sulfa Antibiotics Itching and Rash    Family History  Problem Relation Age of Onset  . Stroke Mother   . Heart disease Mother 94  . Hypertension Sister   . Hypertension Brother   . Heart disease Father 95  . COPD Brother   . Arthritis Brother   . Osteoporosis Sister   . Cancer Neg Hx   . Diabetes Neg Hx       Prior to Admission medications   Medication Sig Start Date End Date Taking? Authorizing Provider  acetaminophen (TYLENOL) 325 MG tablet Take 650 mg by mouth every 6 (six) hours as needed. For pain   Yes Historical Provider, MD  azaTHIOprine (IMURAN) 50 MG tablet TAKE 1 AND 1/2 TABLETS BY MOUTH ONCE DAILY Patient taking differently: Take one tablet by mouth once daily 03/06/15  Yes Len Blalock, NP  bisacodyl (DULCOLAX) 10 MG  suppository Place 1 suppository (10 mg total) rectally as needed for moderate constipation. 05/12/15  Yes Malissa Hippo, MD  Cholecalciferol (VITAMIN D) 2000 UNITS tablet Take 2,000 Units by mouth daily.   Yes Historical Provider, MD  colchicine 0.6 MG tablet Take one tablet by mouth once daily   Yes Historical Provider, MD  diltiazem (TIAZAC) 360 MG 24 hr capsule Take 1 capsule (360 mg total) by mouth daily. 01/06/14  Yes Hollice Espy, MD  febuxostat (ULORIC) 40 MG tablet Take 40 mg by mouth daily.   Yes Historical Provider, MD  folic acid (FOLVITE) 400 MCG tablet Take 400 mcg by mouth daily.    Yes Historical Provider, MD  furosemide (LASIX) 20 MG tablet Take 20 mg by mouth daily as needed for fluid. Take one tablet by mouth once daily as needed if weight is up 3 pounds.   Yes Historical Provider, MD  guaiFENesin-dextromethorphan (  ROBITUSSIN DM) 100-10 MG/5ML syrup Take 5 mLs by mouth every 6 (six) hours as needed for cough. 07/04/15  Yes Richarda Overlie, MD  hydrocortisone (CORTEF) 10 MG tablet Take one tablet by mouth three times daily to prevent adrenal crisis   Yes Historical Provider, MD  lidocaine-prilocaine (EMLA) cream Apply 1 application topically as needed (pain).  06/20/14  Yes Historical Provider, MD  metoprolol (LOPRESSOR) 50 MG tablet Take 25 mg by mouth 2 (two) times daily.    Yes Historical Provider, MD  Nutritional Supplements (ENSURE CLEAR PO) Take twice daily between meals   Yes Historical Provider, MD  pantoprazole (PROTONIX) 40 MG tablet Take 1 tablet (40 mg total) by mouth 2 (two) times daily. 07/04/15  Yes Richarda Overlie, MD  polyethylene glycol (MIRALAX / GLYCOLAX) packet Take 17 g by mouth daily. 03/17/15  Yes Marinda Elk, MD  Probiotic Product (RISA-BID PROBIOTIC) TABS Take one tablet by mouth once daily   Yes Historical Provider, MD  Rectal Cleansers (FLEET NATURALS CLEANSING ENEMA RE) As needed 1 max a day prn constipation   Yes Historical Provider, MD  risperiDONE  (RISPERDAL) 0.25 MG tablet Take 0.25 mg by mouth at bedtime. Hold for sedation   Yes Historical Provider, MD  Travoprost, BAK Free, (TRAVATAN) 0.004 % SOLN ophthalmic solution Place 1 drop into both eyes at bedtime.    Yes Historical Provider, MD  ursodiol (ACTIGALL) 300 MG capsule Take 300 mg by mouth 2 (two) times daily.   Yes Historical Provider, MD    Physical Exam: BP 84/65 mmHg  Pulse 69  Temp(Src) 97.1 F (36.2 C) (Rectal)  Resp 20  SpO2 97%  General: Older black female. Awake and alert and oriented x3. No acute cardiopulmonary distress.  HEENT: Normocephalic atraumatic.  Right and left ears normal in appearance.  Pupils equal, round, reactive to light. Extraocular muscles are intact. Sclerae anicteric and noninjected.  Dry mucosal membranes. No mucosal lesions.  Neck: Neck supple without lymphadenopathy. No carotid bruits. No masses palpated.  Cardiovascular: Regular rate with normal S1-S2 sounds. No murmurs, rubs, gallops auscultated. No JVD.  Respiratory: Rales in the right lungs. Diminished breath sounds throughout. No wheezing, rhonchi.  No accessory muscle use. Abdomen: Soft, nontender, nondistended. Active bowel sounds. No masses or hepatosplenomegaly  Skin: No rashes, lesions, or ulcerations.  Dry, warm to touch. 2+ dorsalis pedis and radial pulses. Musculoskeletal: No calf or leg pain. All major joints not erythematous nontender.  No upper or lower joint deformation.  Good ROM.  No contractures  Psychiatric: Intact judgment and insight. Pleasant and cooperative. Neurologic: No focal neurological deficits. Strength is 5/5 and symmetric in upper and lower extremities.  Cranial nerves II through XII are grossly intact.           Labs on Admission: I have personally reviewed following labs and imaging studies  CBC:  Recent Labs Lab 07/17/15 0823  WBC 2.7*  NEUTROABS 2.0  HGB 9.9*  HCT 28.6*  MCV 84.4  PLT 28*   Basic Metabolic Panel:  Recent Labs Lab  07/17/15 0823  NA 136  K 4.0  CL 111  CO2 20*  GLUCOSE 82  BUN 41*  CREATININE 2.31*  CALCIUM 9.3   GFR: Estimated Creatinine Clearance: 18.7 mL/min (by C-G formula based on Cr of 2.31). Liver Function Tests:  Recent Labs Lab 07/17/15 1507  AST 264*  ALT 42  ALKPHOS 76  BILITOT 3.2*  PROT 5.0*  ALBUMIN 2.4*    Recent Labs Lab 07/17/15 1507  LIPASE 19   No results for input(s): AMMONIA in the last 168 hours. Coagulation Profile: No results for input(s): INR, PROTIME in the last 168 hours. Cardiac Enzymes:  Recent Labs Lab 07/17/15 1507  TROPONINI 0.22*   BNP (last 3 results) No results for input(s): PROBNP in the last 8760 hours. HbA1C: No results for input(s): HGBA1C in the last 72 hours. CBG: No results for input(s): GLUCAP in the last 168 hours. Lipid Profile: No results for input(s): CHOL, HDL, LDLCALC, TRIG, CHOLHDL, LDLDIRECT in the last 72 hours. Thyroid Function Tests: No results for input(s): TSH, T4TOTAL, FREET4, T3FREE, THYROIDAB in the last 72 hours. Anemia Panel: No results for input(s): VITAMINB12, FOLATE, FERRITIN, TIBC, IRON, RETICCTPCT in the last 72 hours. Urine analysis:    Component Value Date/Time   COLORURINE YELLOW 07/17/2015 1646   APPEARANCEUR CLEAR 07/17/2015 1646   LABSPEC 1.020 07/17/2015 1646   PHURINE 5.0 07/17/2015 1646   GLUCOSEU NEGATIVE 07/17/2015 1646   HGBUR NEGATIVE 07/17/2015 1646   BILIRUBINUR SMALL* 07/17/2015 1646   KETONESUR TRACE* 07/17/2015 1646   PROTEINUR NEGATIVE 07/17/2015 1646   UROBILINOGEN 0.2 07/18/2014 1420   NITRITE NEGATIVE 07/17/2015 1646   LEUKOCYTESUR NEGATIVE 07/17/2015 1646   Sepsis Labs: (procalcitonin:4,lacticidven:4) )No results found for this or any previous visit (from the past 240 hour(s)).   Radiological Exams on Admission: Dg Chest 2 View  07/17/2015  CLINICAL DATA:  Atrial fibrillation and hypertension. Shortness of breath. EXAM: CHEST  2 VIEW COMPARISON:  July 05, 2015 FINDINGS: There is persistent cardiomegaly with pulmonary venous hypertension. There is a loculated right pleural effusion. There is mild interstitial edema. There is patchy airspace opacity in the right lower lobe. There is atherosclerotic calcification aorta. There is no apparent adenopathy. No bone lesions are evident. IMPRESSION: Evidence of congestive heart failure. Suspect superimposed pneumonia right lower lobe. Aortic atherosclerosis. Electronically Signed   By: Bretta Bang III M.D.   On: 07/17/2015 15:50   Ct Chest Wo Contrast  07/17/2015  CLINICAL DATA:  Right lower lobe pneumonia. EXAM: CT CHEST WITHOUT CONTRAST TECHNIQUE: Multidetector CT imaging of the chest was performed following the standard protocol without IV contrast. COMPARISON:  Chest x-ray 07/17/2015. FINDINGS: Cardiovascular: The heart is enlarged. Coronary artery calcification is noted. Mitral annular calcification noted. Trace pericardial fluid associated. Atherosclerotic calcification noted in the thoracic aorta. Mediastinum/Nodes: There is no axillary lymphadenopathy. No mediastinal lymphadenopathy. No evidence for gross hilar lymphadenopathy although assessment is limited by the lack of intravenous contrast on today's study. Lungs/Pleura: Interlobular septal thickening is identified bilaterally with small bilateral pleural effusions, right slightly more than left. Patchy areas of ill-defined nodular opacity are identified in the right upper lobe, right middle lobe with compressive atelectasis noted in the lower lobes bilaterally. There is also a small ill-defined nodule in the right lower lobe. Upper Abdomen: Liver is small and underlying cirrhosis not excluded. There is mild to moderate perihepatic fluid. Gallbladder is distended with ill-defined margins and layering high attenuation material may be related to tiny stones. Musculoskeletal: Bone windows reveal no worrisome lytic or sclerotic osseous lesions. IMPRESSION: 1.  Persistent irregular nodules scattered in the right lung not substantially changed where visualized on abdomen and pelvis CT of 07/05/2015. Given the right-sided predominance, multifocal infection is favored. Neoplasm with metastatic disease would also be a consideration. Other etiologies such as septic emboli or cryptogenic pneumonia considered less likely given predominant right-side involvement. 2. Relatively stable appearance of pleural effusions since abdomen and pelvis CT of 07/05/2015.  3. Ascites with small liver raising the question of cirrhosis. No substantial change. 4. Distended ill-defined gallbladder with layering stones or sludge. Cholecystitis not excluded by CT. Overall this is relatively stable in appearance. Electronically Signed   By: Kennith Center M.D.   On: 07/17/2015 19:28    EKG: Independently reviewed. Atrial flutter with 3-1 block. Old anterior infarct. No acute ST elevation or depression.  Assessment/Plan: Principal Problem:   HCAP (healthcare-associated pneumonia) Active Problems:   Other pancytopenia (HCC)   Adrenal insufficiency (HCC)   Lactic acidosis   Elevated troponin I level   Acute renal failure superimposed on stage 3 chronic kidney disease (HCC)   Hypotension   Ulcer of sacral region, stage 2    This patient was discussed with the ED physician, including pertinent vitals, physical exam findings, labs, and imaging.  We also discussed care given by the ED provider.  #1 healthcare associated pneumonia  Admit to stepdown  Multifocal right sided infiltrate by CT chest  I do not think the patient is septic. According to the patient's family, the patient should be on metoprolol 25 mg twice a day instead of 50 mg twice a day, however in reviewing the hospital record, the patient has been on metoprolol 50 mg twice a day in the previous times that she has been admitted. Either way, I think that the patient's hypotension is secondary to medication administration  and not illness.  Patient fluid resuscitated  Will obtain procalcitonin  Vancomycin and cefepime  Blood cultures  Strep antigen by urine  Sputum culture #2 hypotension  Hold antihypertensives #3 lactic acidosis  Repeat lactic acid after hydration #4 acute renal failure superimposed on stage III chronic disease  Hold diuretics  Recheck creatinine in the morning. #5 elevated troponin I level  Serial troponins  Likely demand ischemia due to tachycardia and hypotension #6 ulcer sacral region stage II  Wound care consult #7 pancytopenia #8 Adrenal insufficiency  Stress dose steroids  DVT prophylaxis: SCDs Consultants: None Code Status: Full code Family Communication: Daughters in the room  Disposition Plan: Return depends on her following admission   Levie Heritage, DO [facility] Pager [phone]  If 7PM-7AM, please contact night-coverage [email or web address] Password [email or web address]

## 2015-07-17 NOTE — ED Notes (Signed)
Family reports Pt experiencing weakness and lab work showing low platelets while at [facility]. Family also reports a recent blood transfusion that resulted in an adverse immune response and hypotensive state.

## 2015-07-17 NOTE — ED Notes (Signed)
Patient assisted to bedside commode.  Patient urinated 150 mL. Sample was contaminated with dirt. Patient informed that we need another sample and will attempt after bolus is completed.

## 2015-07-18 DIAGNOSIS — N179 Acute kidney failure, unspecified: Secondary | ICD-10-CM

## 2015-07-18 DIAGNOSIS — J189 Pneumonia, unspecified organism: Principal | ICD-10-CM

## 2015-07-18 DIAGNOSIS — E2740 Unspecified adrenocortical insufficiency: Secondary | ICD-10-CM

## 2015-07-18 DIAGNOSIS — N183 Chronic kidney disease, stage 3 (moderate): Secondary | ICD-10-CM

## 2015-07-18 LAB — MRSA PCR SCREENING: MRSA BY PCR: NEGATIVE

## 2015-07-18 LAB — CBC
HCT: 30.6 % — ABNORMAL LOW (ref 36.0–46.0)
Hemoglobin: 10.5 g/dL — ABNORMAL LOW (ref 12.0–15.0)
MCH: 29.5 pg (ref 26.0–34.0)
MCHC: 34.3 g/dL (ref 30.0–36.0)
MCV: 86 fL (ref 78.0–100.0)
Platelets: 21 K/uL — CL (ref 150–400)
RBC: 3.56 MIL/uL — ABNORMAL LOW (ref 3.87–5.11)
RDW: 24.8 % — ABNORMAL HIGH (ref 11.5–15.5)
WBC: 3.6 K/uL — ABNORMAL LOW (ref 4.0–10.5)

## 2015-07-18 LAB — BASIC METABOLIC PANEL
ANION GAP: 10 (ref 5–15)
BUN: 42 mg/dL — ABNORMAL HIGH (ref 6–20)
CHLORIDE: 111 mmol/L (ref 101–111)
CO2: 16 mmol/L — AB (ref 22–32)
CREATININE: 2.19 mg/dL — AB (ref 0.44–1.00)
Calcium: 9.5 mg/dL (ref 8.9–10.3)
GFR calc non Af Amer: 20 mL/min — ABNORMAL LOW (ref >60)
GFR, EST AFRICAN AMERICAN: 23 mL/min — AB (ref >60)
Glucose, Bld: 163 mg/dL — ABNORMAL HIGH (ref 65–99)
POTASSIUM: 4.7 mmol/L (ref 3.5–5.1)
Sodium: 137 mmol/L (ref 135–145)

## 2015-07-18 LAB — PROCALCITONIN: PROCALCITONIN: 0.47 ng/mL

## 2015-07-18 LAB — TROPONIN I
Troponin I: 0.15 ng/mL (ref <0.03)
Troponin I: 0.16 ng/mL (ref <0.03)

## 2015-07-18 MED ORDER — HYDROCORTISONE NA SUCCINATE PF 100 MG IJ SOLR
50.0000 mg | Freq: Three times a day (TID) | INTRAMUSCULAR | Status: DC
  Administered 2015-07-18 – 2015-07-20 (×5): 50 mg via INTRAVENOUS
  Filled 2015-07-18 (×5): qty 2

## 2015-07-18 MED ORDER — DILTIAZEM HCL ER COATED BEADS 180 MG PO CP24
360.0000 mg | ORAL_CAPSULE | Freq: Every day | ORAL | Status: DC
  Administered 2015-07-19 – 2015-07-20 (×2): 360 mg via ORAL
  Filled 2015-07-18 (×2): qty 2

## 2015-07-18 MED ORDER — LOPERAMIDE HCL 2 MG PO CAPS
2.0000 mg | ORAL_CAPSULE | ORAL | Status: DC | PRN
  Administered 2015-07-18 – 2015-07-19 (×3): 2 mg via ORAL
  Filled 2015-07-18 (×3): qty 1

## 2015-07-18 MED ORDER — METOPROLOL TARTRATE 25 MG PO TABS
25.0000 mg | ORAL_TABLET | Freq: Two times a day (BID) | ORAL | Status: DC
  Administered 2015-07-18 – 2015-07-20 (×5): 25 mg via ORAL
  Filled 2015-07-18 (×5): qty 1

## 2015-07-18 MED ORDER — METOPROLOL TARTRATE 5 MG/5ML IV SOLN
5.0000 mg | Freq: Once | INTRAVENOUS | Status: AC
  Administered 2015-07-18: 5 mg via INTRAVENOUS
  Filled 2015-07-18: qty 5

## 2015-07-18 MED ORDER — SODIUM CHLORIDE 0.9 % IV SOLN
INTRAVENOUS | Status: DC
  Administered 2015-07-18 – 2015-07-19 (×2): via INTRAVENOUS
  Administered 2015-07-19: 1000 mL via INTRAVENOUS
  Administered 2015-07-20: 06:00:00 via INTRAVENOUS

## 2015-07-18 NOTE — Progress Notes (Signed)
-  PT FREQUENTLY MOANING AND CRYING OUT. WHEN THIS RN AWAKENS HER SHE DENYS AND PAIN,OR DISCOMFORT.

## 2015-07-18 NOTE — Progress Notes (Signed)
AM DOSE OF DILTIAZEM 360MG  PO GIVEN EARLY THIS AM DUE TO HR SUSTAINING IN THE 130'S.

## 2015-07-18 NOTE — Consult Note (Signed)
WOC Nurse wound consult note Reason for Consult: Moisture associated skin damage (MASD), specifically incontinence associated dermatitis (IAD), Area is not a pressure injury/ulcer. Wound type:Moisture Pressure Ulcer POA: No Measurement: Two partial thickness areas of tissue loss secondary to moisture and friction, the largest of which measures 0.4cm x 0.2cm x 0.1cm with a pink moist wound bed. Wound bed:as described above Drainage (amount, consistency, odor) scant serous Periwound:Intact, moist Dressing procedure/placement/frequency: Patient is on a therapuetic mattress with low air loss feature however, she is wearing an incontinence brief and has two plastic chux/underpads beneath her.  I have removed all of these in favor of maximizing air flow to the affected area.  We will use only cloth Dermatherapy underpads beneath the patient and will provide timely incontinence care using our house products.  Supine positioning will be limited to meals only and HOB will be at or below a 30 degree angle to reduce shear to fragile tissues. Bilateral heel boots are provided to redistribution pressure and protect skin integrity.  I have spoken to 3 children about the POC, including a daughter who is a Engineer, civil (consulting) and all are in agreement with the plan and express appreciation for visiting their mother today. WOC nursing team will not follow, but will remain available to this patient, the nursing and medical teams.  Please re-consult if needed. Thanks, Ladona Mow, MSN, RN, GNP, [facility]  Pager# [phone]

## 2015-07-18 NOTE — Progress Notes (Addendum)
PROGRESS NOTE    Eileen Santana  SEG:[id number] DOB: 1930/02/28 DOA: 07/17/2015 PCP: [name], MD     Brief Narrative:  82 year old woman admitted on 7/14 from [facility] nursing center with complaints of weakness and shortness of breath. She was found to have pneumonia on chest x-ray and was admitted for management.   Assessment & Plan:   Principal Problem:   HCAP (healthcare-associated pneumonia) Active Problems:   Other pancytopenia (HCC)   Adrenal insufficiency (HCC)   Lactic acidosis   Elevated troponin I level   Acute renal failure superimposed on stage 3 chronic kidney disease (HCC)   Hypotension   Ulcer of sacral region, stage 2   Hospital-acquired pneumonia -Agree with cefepime and vancomycin pending culture data.  -has no current oxygen requirements.  Acute encephalopathy -Suspect related to infection plus minus steroid effect. -We'll start titrating steroids.  Elevated troponin -Suspect due to demand ischemia.  -will check 2-D echo, as long as no wall motion abnormalities and ejection fraction preserved, no further cardiac workup.  Thrombocytopenia -Appears chronic in nature, no signs of active bleeding, no indication for transfusion. -It appears etiology is thought to be secondary to hypersplenism with autoimmune hepatitis.  Atrial flutter with RVR -Metoprolol had been discontinued on admission, will restart and monitor.  Adrenal insufficiency -Continue stress dose steroids although will reduce dose to see if it will improve her encephalopathy.  Acute on chronic kidney disease stage III -Start fluids and monitor creatinine.   DVT prophylaxis: SCDs Code Status: Full code Family Communication: Discussed with daughters at bedside Disposition Plan: Keep an ICU  Consultants:   None  Procedures:   None  Antimicrobials:   Vancomycin  Cefepime    Subjective: Is confused  Objective: Filed Vitals:   07/18/15 0716 07/18/15 0800 07/18/15  0900 07/18/15 1054  BP:  137/83 124/83 122/88  Pulse:  131 137 130  Temp: 96.8 F (36 C)     TempSrc: Oral     Resp:  22 19   Height:      Weight:      SpO2:  97% 96%     Intake/Output Summary (Last 24 hours) at 07/18/15 1201 Last data filed at 07/18/15 0830  Gross per 24 hour  Intake    650 ml  Output    200 ml  Net    450 ml   Filed Weights   07/17/15 2157  Weight: 73.4 kg (161 lb 13.1 oz)    Examination:  General exam: Awake, confused Respiratory system: Clear to auscultation. Respiratory effort normal. Cardiovascular system: Tachycardic, regular, no murmurs Gastrointestinal system: Abdomen is nondistended, soft and nontender. No organomegaly or masses felt. Normal bowel sounds heard. Central nervous system: Nonfocal Extremities: No C/C/E, 1+ edema bilaterally Skin: No rashes, lesions or ulcers     Data Reviewed: I have personally reviewed following labs and imaging studies  CBC:  Recent Labs Lab 07/17/15 0823 07/18/15 0514  WBC 2.7* 3.6*  NEUTROABS 2.0  --   HGB 9.9* 10.5*  HCT 28.6* 30.6*  MCV 84.4 86.0  PLT 28* 21*   Basic Metabolic Panel:  Recent Labs Lab 07/17/15 0823 07/18/15 0514  NA 136 137  K 4.0 4.7  CL 111 111  CO2 20* 16*  GLUCOSE 82 163*  BUN 41* 42*  CREATININE 2.31* 2.19*  CALCIUM 9.3 9.5   GFR: Estimated Creatinine Clearance: 19.9 mL/min (by C-G formula based on Cr of 2.19). Liver Function Tests:  Recent Labs Lab 07/17/15 1507  AST  264*  ALT 42  ALKPHOS 76  BILITOT 3.2*  PROT 5.0*  ALBUMIN 2.4*    Recent Labs Lab 07/17/15 1507  LIPASE 19   No results for input(s): AMMONIA in the last 168 hours. Coagulation Profile: No results for input(s): INR, PROTIME in the last 168 hours. Cardiac Enzymes:  Recent Labs Lab 07/17/15 1507 07/17/15 2105 07/18/15 0250 07/18/15 0844  TROPONINI 0.22* 0.19* 0.15* 0.16*   BNP (last 3 results) No results for input(s): PROBNP in the last 8760 hours. HbA1C: No results for  input(s): HGBA1C in the last 72 hours. CBG: No results for input(s): GLUCAP in the last 168 hours. Lipid Profile: No results for input(s): CHOL, HDL, LDLCALC, TRIG, CHOLHDL, LDLDIRECT in the last 72 hours. Thyroid Function Tests: No results for input(s): TSH, T4TOTAL, FREET4, T3FREE, THYROIDAB in the last 72 hours. Anemia Panel: No results for input(s): VITAMINB12, FOLATE, FERRITIN, TIBC, IRON, RETICCTPCT in the last 72 hours. Urine analysis:    Component Value Date/Time   COLORURINE YELLOW 07/17/2015 1646   APPEARANCEUR CLEAR 07/17/2015 1646   LABSPEC 1.020 07/17/2015 1646   PHURINE 5.0 07/17/2015 1646   GLUCOSEU NEGATIVE 07/17/2015 1646   HGBUR NEGATIVE 07/17/2015 1646   BILIRUBINUR SMALL* 07/17/2015 1646   KETONESUR TRACE* 07/17/2015 1646   PROTEINUR NEGATIVE 07/17/2015 1646   UROBILINOGEN 0.2 07/18/2014 1420   NITRITE NEGATIVE 07/17/2015 1646   LEUKOCYTESUR NEGATIVE 07/17/2015 1646   Sepsis Labs: @LABRCNTIP (procalcitonin:4,lacticidven:4)  ) Recent Results (from the past 240 hour(s))  Culture, blood (routine x 2) Call MD if unable to obtain prior to antibiotics being given     Status: None (Preliminary result)   Collection Time: 07/17/15  9:05 PM  Result Value Ref Range Status   Specimen Description BLOOD  Final   Special Requests NONE  Final   Culture NO GROWTH < 12 HOURS  Final   Report Status PENDING  Incomplete  Culture, blood (routine x 2) Call MD if unable to obtain prior to antibiotics being given     Status: None (Preliminary result)   Collection Time: 07/17/15  9:09 PM  Result Value Ref Range Status   Specimen Description BLOOD  Final   Special Requests NONE  Final   Culture NO GROWTH < 12 HOURS  Final   Report Status PENDING  Incomplete  MRSA PCR Screening     Status: None   Collection Time: 07/17/15  9:35 PM  Result Value Ref Range Status   MRSA by PCR NEGATIVE NEGATIVE Final    Comment:        The GeneXpert MRSA Assay (FDA approved for NASAL  specimens only), is one component of a comprehensive MRSA colonization surveillance program. It is not intended to diagnose MRSA infection nor to guide or monitor treatment for MRSA infections.          Radiology Studies: Dg Chest 2 View  07/17/2015  CLINICAL DATA:  Atrial fibrillation and hypertension. Shortness of breath. EXAM: CHEST  2 VIEW COMPARISON:  July 05, 2015 FINDINGS: There is persistent cardiomegaly with pulmonary venous hypertension. There is a loculated right pleural effusion. There is mild interstitial edema. There is patchy airspace opacity in the right lower lobe. There is atherosclerotic calcification aorta. There is no apparent adenopathy. No bone lesions are evident. IMPRESSION: Evidence of congestive heart failure. Suspect superimposed pneumonia right lower lobe. Aortic atherosclerosis. Electronically Signed   By: July 07, 2015 III M.D.   On: 07/17/2015 15:50   Ct Chest Wo Contrast  07/17/2015  CLINICAL  DATA:  Right lower lobe pneumonia. EXAM: CT CHEST WITHOUT CONTRAST TECHNIQUE: Multidetector CT imaging of the chest was performed following the standard protocol without IV contrast. COMPARISON:  Chest x-ray 07/17/2015. FINDINGS: Cardiovascular: The heart is enlarged. Coronary artery calcification is noted. Mitral annular calcification noted. Trace pericardial fluid associated. Atherosclerotic calcification noted in the thoracic aorta. Mediastinum/Nodes: There is no axillary lymphadenopathy. No mediastinal lymphadenopathy. No evidence for gross hilar lymphadenopathy although assessment is limited by the lack of intravenous contrast on today's study. Lungs/Pleura: Interlobular septal thickening is identified bilaterally with small bilateral pleural effusions, right slightly more than left. Patchy areas of ill-defined nodular opacity are identified in the right upper lobe, right middle lobe with compressive atelectasis noted in the lower lobes bilaterally. There is also a  small ill-defined nodule in the right lower lobe. Upper Abdomen: Liver is small and underlying cirrhosis not excluded. There is mild to moderate perihepatic fluid. Gallbladder is distended with ill-defined margins and layering high attenuation material may be related to tiny stones. Musculoskeletal: Bone windows reveal no worrisome lytic or sclerotic osseous lesions. IMPRESSION: 1. Persistent irregular nodules scattered in the right lung not substantially changed where visualized on abdomen and pelvis CT of 07/05/2015. Given the right-sided predominance, multifocal infection is favored. Neoplasm with metastatic disease would also be a consideration. Other etiologies such as septic emboli or cryptogenic pneumonia considered less likely given predominant right-side involvement. 2. Relatively stable appearance of pleural effusions since abdomen and pelvis CT of 07/05/2015. 3. Ascites with small liver raising the question of cirrhosis. No substantial change. 4. Distended ill-defined gallbladder with layering stones or sludge. Cholecystitis not excluded by CT. Overall this is relatively stable in appearance. Electronically Signed   By: Kennith Center M.D.   On: 07/17/2015 19:28        Scheduled Meds: . ceFEPime (MAXIPIME) IV  1 g Intravenous Q24H  . colchicine  0.6 mg Oral Daily  . diltiazem  360 mg Oral Daily  . febuxostat  40 mg Oral Daily  . feeding supplement (ENSURE ENLIVE)  237 mL Oral BID  . hydrocortisone sod succinate (SOLU-CORTEF) inj  50 mg Intravenous Q8H  . latanoprost  1 drop Both Eyes QHS  . metoprolol tartrate  25 mg Oral BID  . pantoprazole  40 mg Oral BID  . risperiDONE  0.25 mg Oral QHS  . ursodiol  300 mg Oral BID  . vancomycin  750 mg Intravenous Q24H   Continuous Infusions: . sodium chloride 75 mL/hr at 07/18/15 1157     LOS: 1 day    Time spent: 25 minutes. Greater than 50% of this time was spent in direct contact with the patient coordinating care.     Chaya Jan, MD [facility] Pager [phone]  If 7PM-7AM, please contact night-coverage [email or web address] Password [email or web address] 07/18/2015, 12:01 PM

## 2015-07-19 ENCOUNTER — Inpatient Hospital Stay (HOSPITAL_COMMUNITY): Payer: Medicare Other

## 2015-07-19 LAB — BASIC METABOLIC PANEL
Anion gap: 6 (ref 5–15)
BUN: 49 mg/dL — AB (ref 6–20)
CHLORIDE: 114 mmol/L — AB (ref 101–111)
CO2: 16 mmol/L — ABNORMAL LOW (ref 22–32)
CREATININE: 2.25 mg/dL — AB (ref 0.44–1.00)
Calcium: 8.9 mg/dL (ref 8.9–10.3)
GFR calc Af Amer: 22 mL/min — ABNORMAL LOW (ref >60)
GFR calc non Af Amer: 19 mL/min — ABNORMAL LOW (ref >60)
GLUCOSE: 133 mg/dL — AB (ref 65–99)
Potassium: 4.3 mmol/L (ref 3.5–5.1)
SODIUM: 136 mmol/L (ref 135–145)

## 2015-07-19 LAB — ECHOCARDIOGRAM COMPLETE
HEIGHTINCHES: 65 in
WEIGHTICAEL: 2670.21 [oz_av]

## 2015-07-19 LAB — CBC
HCT: 24.3 % — ABNORMAL LOW (ref 36.0–46.0)
Hemoglobin: 8.5 g/dL — ABNORMAL LOW (ref 12.0–15.0)
MCH: 29.1 pg (ref 26.0–34.0)
MCHC: 35 g/dL (ref 30.0–36.0)
MCV: 83.2 fL (ref 78.0–100.0)
PLATELETS: 26 10*3/uL — AB (ref 150–400)
RBC: 2.92 MIL/uL — ABNORMAL LOW (ref 3.87–5.11)
RDW: 24.6 % — AB (ref 11.5–15.5)
WBC: 5.3 10*3/uL (ref 4.0–10.5)

## 2015-07-19 LAB — PROCALCITONIN: Procalcitonin: 0.55 ng/mL

## 2015-07-19 LAB — URINE CULTURE

## 2015-07-19 NOTE — Progress Notes (Signed)
PROGRESS NOTE    Eileen Santana  ZES:[id number] DOB: [birth year] DOA: 07/17/2015 PCP: [name], MD     Brief Narrative:  82 year old woman admitted on 7/14 from [facility] nursing center with complaints of weakness and shortness of breath. She was found to have pneumonia on chest x-ray and was admitted for management.   Assessment & Plan:   Principal Problem:   HCAP (healthcare-associated pneumonia) Active Problems:   Other pancytopenia (HCC)   Adrenal insufficiency (HCC)   Lactic acidosis   Elevated troponin I level   Acute renal failure superimposed on stage 3 chronic kidney disease (HCC)   Hypotension   Ulcer of sacral region, stage 2   Hospital-acquired pneumonia -Agree with cefepime and vancomycin pending culture data. Cultures remain negative at day 2.  -has no current oxygen requirements.  Acute encephalopathy -Suspect related to infection plus minus steroid effect. -We'll start titrating steroids.  Elevated troponin -Suspect due to demand ischemia.  -2-D echo: Ejection fraction of 60-65% with no wall motion abnormalities. -No further cardiac workup.  Thrombocytopenia -Appears chronic in nature, no signs of active bleeding, no indication for transfusion. -It appears etiology is thought to be secondary to hypersplenism with autoimmune hepatitis.  Atrial flutter with RVR -Rates are now controlled with previously a patient of metoprolol, continue Cardizem.  Adrenal insufficiency -Continue stress dose steroids although will reduce dose to see if it will improve her encephalopathy.  Acute on chronic kidney disease stage III -Start fluids and monitor creatinine.   DVT prophylaxis: SCDs Code Status: Full code Family Communication: Discussed with daughter at bedside Disposition Plan: Transfer to telemetry  Consultants:   None  Procedures:   None  Antimicrobials:   Vancomycin  Cefepime    Subjective: Is confused  Objective: Filed Vitals:   07/19/15 1200 07/19/15 1300 07/19/15 1400 07/19/15 1500  BP: 104/61 91/60 97/59    Pulse: 78  71 75  Temp:      TempSrc:      Resp: 13 13 14 17   Height:      Weight:      SpO2: 99%  98% 100%    Intake/Output Summary (Last 24 hours) at 07/19/15 1537 Last data filed at 07/19/15 1507  Gross per 24 hour  Intake 2560.75 ml  Output      0 ml  Net 2560.75 ml   Filed Weights   07/17/15 2157 07/19/15 0500  Weight: 73.4 kg (161 lb 13.1 oz) 75.7 kg (166 lb 14.2 oz)    Examination:  General exam: Awake, confused Respiratory system: Clear to auscultation. Respiratory effort normal. Cardiovascular system: Tachycardic, regular, no murmurs Gastrointestinal system: Abdomen is nondistended, soft and nontender. No organomegaly or masses felt. Normal bowel sounds heard. Central nervous system: Nonfocal Extremities: No C/C/E, 1+ edema bilaterally Skin: No rashes, lesions or ulcers     Data Reviewed: I have personally reviewed following labs and imaging studies  CBC:  Recent Labs Lab 07/17/15 0823 07/18/15 0514 07/19/15 0704  WBC 2.7* 3.6* 5.3  NEUTROABS 2.0  --   --   HGB 9.9* 10.5* 8.5*  HCT 28.6* 30.6* 24.3*  MCV 84.4 86.0 83.2  PLT 28* 21* 26*   Basic Metabolic Panel:  Recent Labs Lab 07/17/15 0823 07/18/15 0514 07/19/15 0704  NA 136 137 136  K 4.0 4.7 4.3  CL 111 111 114*  CO2 20* 16* 16*  GLUCOSE 82 163* 133*  BUN 41* 42* 49*  CREATININE 2.31* 2.19* 2.25*  CALCIUM 9.3 9.5 8.9   GFR:  Estimated Creatinine Clearance: 19.6 mL/min (by C-G formula based on Cr of 2.25). Liver Function Tests:  Recent Labs Lab 07/17/15 1507  AST 264*  ALT 42  ALKPHOS 76  BILITOT 3.2*  PROT 5.0*  ALBUMIN 2.4*    Recent Labs Lab 07/17/15 1507  LIPASE 19   No results for input(s): AMMONIA in the last 168 hours. Coagulation Profile: No results for input(s): INR, PROTIME in the last 168 hours. Cardiac Enzymes:  Recent Labs Lab 07/17/15 1507 07/17/15 2105 07/18/15 0250  07/18/15 0844  TROPONINI 0.22* 0.19* 0.15* 0.16*   BNP (last 3 results) No results for input(s): PROBNP in the last 8760 hours. HbA1C: No results for input(s): HGBA1C in the last 72 hours. CBG: No results for input(s): GLUCAP in the last 168 hours. Lipid Profile: No results for input(s): CHOL, HDL, LDLCALC, TRIG, CHOLHDL, LDLDIRECT in the last 72 hours. Thyroid Function Tests: No results for input(s): TSH, T4TOTAL, FREET4, T3FREE, THYROIDAB in the last 72 hours. Anemia Panel: No results for input(s): VITAMINB12, FOLATE, FERRITIN, TIBC, IRON, RETICCTPCT in the last 72 hours. Urine analysis:    Component Value Date/Time   COLORURINE YELLOW 07/17/2015 1646   APPEARANCEUR CLEAR 07/17/2015 1646   LABSPEC 1.020 07/17/2015 1646   PHURINE 5.0 07/17/2015 1646   GLUCOSEU NEGATIVE 07/17/2015 1646   HGBUR NEGATIVE 07/17/2015 1646   BILIRUBINUR SMALL* 07/17/2015 1646   KETONESUR TRACE* 07/17/2015 1646   PROTEINUR NEGATIVE 07/17/2015 1646   UROBILINOGEN 0.2 07/18/2014 1420   NITRITE NEGATIVE 07/17/2015 1646   LEUKOCYTESUR NEGATIVE 07/17/2015 1646   Sepsis Labs: @LABRCNTIP (procalcitonin:4,lacticidven:4)  ) Recent Results (from the past 240 hour(s))  Urine culture     Status: Abnormal   Collection Time: 07/17/15  4:46 PM  Result Value Ref Range Status   Specimen Description URINE, CLEAN CATCH  Final   Special Requests NONE  Final   Culture MULTIPLE SPECIES PRESENT, SUGGEST RECOLLECTION (A)  Final   Report Status 07/19/2015 FINAL  Final  Culture, blood (routine x 2) Call MD if unable to obtain prior to antibiotics being given     Status: None (Preliminary result)   Collection Time: 07/17/15  9:05 PM  Result Value Ref Range Status   Specimen Description BLOOD LEFT HAND  Final   Special Requests BOTTLES DRAWN AEROBIC AND ANAEROBIC 6CC EACG  Final   Culture NO GROWTH 2 DAYS  Final   Report Status PENDING  Incomplete  Culture, blood (routine x 2) Call MD if unable to obtain prior to  antibiotics being given     Status: None (Preliminary result)   Collection Time: 07/17/15  9:09 PM  Result Value Ref Range Status   Specimen Description BLOOD LEFT HAND  Final   Special Requests BOTTLES DRAWN AEROBIC ONLY 4CC ONLY  Final   Culture NO GROWTH 2 DAYS  Final   Report Status PENDING  Incomplete  MRSA PCR Screening     Status: None   Collection Time: 07/17/15  9:35 PM  Result Value Ref Range Status   MRSA by PCR NEGATIVE NEGATIVE Final    Comment:        The GeneXpert MRSA Assay (FDA approved for NASAL specimens only), is one component of a comprehensive MRSA colonization surveillance program. It is not intended to diagnose MRSA infection nor to guide or monitor treatment for MRSA infections.          Radiology Studies: Dg Chest 2 View  07/17/2015  CLINICAL DATA:  Atrial fibrillation and hypertension. Shortness of breath.  EXAM: CHEST  2 VIEW COMPARISON:  July 05, 2015 FINDINGS: There is persistent cardiomegaly with pulmonary venous hypertension. There is a loculated right pleural effusion. There is mild interstitial edema. There is patchy airspace opacity in the right lower lobe. There is atherosclerotic calcification aorta. There is no apparent adenopathy. No bone lesions are evident. IMPRESSION: Evidence of congestive heart failure. Suspect superimposed pneumonia right lower lobe. Aortic atherosclerosis. Electronically Signed   By: Bretta Bang III M.D.   On: 07/17/2015 15:50   Ct Chest Wo Contrast  07/17/2015  CLINICAL DATA:  Right lower lobe pneumonia. EXAM: CT CHEST WITHOUT CONTRAST TECHNIQUE: Multidetector CT imaging of the chest was performed following the standard protocol without IV contrast. COMPARISON:  Chest x-ray 07/17/2015. FINDINGS: Cardiovascular: The heart is enlarged. Coronary artery calcification is noted. Mitral annular calcification noted. Trace pericardial fluid associated. Atherosclerotic calcification noted in the thoracic aorta.  Mediastinum/Nodes: There is no axillary lymphadenopathy. No mediastinal lymphadenopathy. No evidence for gross hilar lymphadenopathy although assessment is limited by the lack of intravenous contrast on today's study. Lungs/Pleura: Interlobular septal thickening is identified bilaterally with small bilateral pleural effusions, right slightly more than left. Patchy areas of Santana-defined nodular opacity are identified in the right upper lobe, right middle lobe with compressive atelectasis noted in the lower lobes bilaterally. There is also a small Santana-defined nodule in the right lower lobe. Upper Abdomen: Liver is small and underlying cirrhosis not excluded. There is mild to moderate perihepatic fluid. Gallbladder is distended with Santana-defined margins and layering high attenuation material may be related to tiny stones. Musculoskeletal: Bone windows reveal no worrisome lytic or sclerotic osseous lesions. IMPRESSION: 1. Persistent irregular nodules scattered in the right lung not substantially changed where visualized on abdomen and pelvis CT of 07/05/2015. Given the right-sided predominance, multifocal infection is favored. Neoplasm with metastatic disease would also be a consideration. Other etiologies such as septic emboli or cryptogenic pneumonia considered less likely given predominant right-side involvement. 2. Relatively stable appearance of pleural effusions since abdomen and pelvis CT of 07/05/2015. 3. Ascites with small liver raising the question of cirrhosis. No substantial change. 4. Distended Santana-defined gallbladder with layering stones or sludge. Cholecystitis not excluded by CT. Overall this is relatively stable in appearance. Electronically Signed   By: Kennith Center M.D.   On: 07/17/2015 19:28        Scheduled Meds: . ceFEPime (MAXIPIME) IV  1 g Intravenous Q24H  . colchicine  0.6 mg Oral Daily  . diltiazem  360 mg Oral Daily  . febuxostat  40 mg Oral Daily  . feeding supplement (ENSURE  ENLIVE)  237 mL Oral BID  . hydrocortisone sod succinate (SOLU-CORTEF) inj  50 mg Intravenous Q8H  . latanoprost  1 drop Both Eyes QHS  . metoprolol tartrate  25 mg Oral BID  . pantoprazole  40 mg Oral BID  . risperiDONE  0.25 mg Oral QHS  . ursodiol  300 mg Oral BID  . vancomycin  750 mg Intravenous Q24H   Continuous Infusions: . sodium chloride 75 mL/hr at 07/19/15 1507     LOS: 2 days    Time spent: 25 minutes. Greater than 50% of this time was spent in direct contact with the patient coordinating care.     Chaya Jan, MD [facility] Pager [phone]  If 7PM-7AM, please contact night-coverage [email or web address] Password [facility] 07/19/2015, 3:36 PM

## 2015-07-19 NOTE — Progress Notes (Signed)
PT CONTINUES TO CRY OUT FREQUENTLY IN HER SLEEP. WHEN RN GOES INTO ROOM TO ASK IF PT IS ALL RIGHT SHE SAYS "YES! I AM NOT HURTING ANYWHERE."

## 2015-07-19 NOTE — Progress Notes (Signed)
PT RESPONDED WELL TO CHRISTIAN MUSIC BEING PLAYED IN HER ROOM. SHE FINALLY SETTLED DOWN AND SLEPT FOR BRIEF INTERVALS.

## 2015-07-19 NOTE — Progress Notes (Signed)
  Echocardiogram 2D Echocardiogram has been performed.  Eileen Santana 07/19/2015, 8:22 AM

## 2015-07-20 ENCOUNTER — Inpatient Hospital Stay
Admission: RE | Admit: 2015-07-20 | Discharge: 2015-08-04 | Disposition: E | Payer: Medicare Other | Source: Ambulatory Visit | Attending: Internal Medicine | Admitting: Internal Medicine

## 2015-07-20 ENCOUNTER — Encounter (HOSPITAL_COMMUNITY)
Admission: RE | Admit: 2015-07-20 | Discharge: 2015-07-20 | Disposition: A | Discharge status: Home / Self Care | Payer: Medicare Other | Source: Skilled Nursing Facility | Attending: *Deleted | Admitting: *Deleted

## 2015-07-20 MED ORDER — LORAZEPAM 1 MG PO TABS
1.0000 mg | ORAL_TABLET | ORAL | Status: DC | PRN
Start: 2015-07-20 — End: 2015-07-20

## 2015-07-20 MED ORDER — MORPHINE SULFATE (CONCENTRATE) 10 MG/0.5ML PO SOLN: 2.5000 mg | ORAL | Status: AC | PRN

## 2015-07-20 MED ORDER — MORPHINE SULFATE (CONCENTRATE) 10 MG/0.5ML PO SOLN: 2.5000 mg | ORAL | Status: DC | PRN

## 2015-07-20 MED ORDER — LORAZEPAM 1 MG PO TABS: 1.0000 mg | ORAL_TABLET | ORAL | Status: AC | PRN

## 2015-07-20 NOTE — Care Management Note (Signed)
Case Management Note  Patient Details  Name: Breona Cherubin MRN: [id number] Date of Birth: 12-06-1930      Comments: Faxed referral to [facility] hospice per patient request. Notified Shon Hale at [facility] of referral.   Ted Goodner, Chrystine Oiler, RN 29-Jul-2015, 11:56 AM

## 2015-07-20 NOTE — Care Management Note (Signed)
Case Management Note  Patient Details  Name: Eileen Santana MRN: [id number] Date of Birth: [birth year] Eileen, Santana ([id number]) Female Nov 29, 1930     Room Bed   IC09 IC09-01    Patient Demographics    Address Phone   [address] [place] [address] [phone] (Home) *Preferred*    Patient Ethnicity & Race    Ethnic Group Patient Race   Not Hispanic or Latino Black or African American    Emergency Contact(s)    Name Relation Home Work Mobile   [place] Daughter [phone]   [place]umm[place],Linda Daughter [phone]   Chena, Chohan Daughter [phone]  [phone]    Documents on File      Status Date Received Description   Documents for the Patient   [place] HIPAA NOTICE OF PRIVACY - Scanned Not Received     [place] E-Signature HIPAA Notice of Privacy Received 04/12/10    [place] E-Signature HIPAA Notice of Privacy Spanish Not Received     Driver's License Received 02/22/11    Insurance Card Received 06/11/13 MCR.MCD/DMM/RCGD   Advance Directives/Living Will/HCPOA/POA Not Received     Insurance Card Received 02/17/11 RCGD - OLD   Financial Application Not Received     AMB Correspondence Not Received  10/12 D/C AP[facility]   AMB Correspondence Not Received  09/12 D/C APCC   AMB Correspondence Not Received  12/12 [facility]   AMB Correspondence Not Received  12/12-01/13 IM [facility]   Driver's License Received 02/06/12 RCGD   HIM ROI Authorization Not Received  RCGD--02/17/11 note to PCP   HIM ROI Authorization Not Received     [place] HIPAA NOTICE OF PRIVACY - Scanned Not Received     AMB Correspondence Not Received  02/13 Int Med [facility]   HIM ROI Authorization Not Received  RCGD--03/25/11 U/S to Dr Mariel Sleet (onocologist)   HIM ROI Authorization Not Received  RCGD--03/25/11 U/S to Dr Mariel Sleet (oncologist)   HIM ROI Authorization Not  Received  RCGD--02/17/11 - 03/24/11 prog notes, labs, xrays, pathology result to Dr Kristian Covey   HIM ROI Authorization Not Received     HIM ROI Authorization Not Received  RCGD--03/31/11 labs to [facility] (cardiologist)   HIM ROI Authorization Not Received  RCGD--04/26/11 note to PCP   HIM ROI Authorization Not Received  RCGD--04/26/11 - present prog notes, lab, xrays   HIM ROI Authorization Not Received     AMB Correspondence Not Received  04/13 card [facility]   AMB HH/NH/Hospice Not Received  01/75 cert/poc Adv [facility]   AMB HH/NH/Hospice Not Received  03/13 face to face Adv [facility]   AMB Correspondence Not Received  03/13 Card [facility]   AMB Correspondence Not Received  04/13 Int Med [facility]   HIM ROI Authorization Not Received  RCGD--05/23/11 note to PCP   AMB Correspondence Not Received  10/12 office note [facility]   AMB Correspondence Not Received  11/02-04/13 int med [facility]   AMB Correspondence Not Received  04/12 order [facility]   AMB Correspondence Not Received  04/13 rx [facility]   AMB Correspondence Not Received  05/13 card [facility]   AMB Correspondence Not Received  07/13 int med [facility]   AMB HH/NH/Hospice Not Received  04/13 Face to Face Adv [facility]   HIM ROI Authorization Not Received  RCGD--08/23/11 note to PCP   HIM ROI Authorization Not Received  [facility] Prepayment Review   HIM ROI Authorization Not Received  RCGD--10/04/11 note to PCP  AMB Correspondence Not Received  07/13 int med [facility]   AMB Correspondence Not Received  11/13 Int Med [facility]   AMB Correspondence Not Received  11/13 int med [facility]   HIM ROI Authorization Not Received  RCGD--02/06/12 prog note to PCP   [facility] E-Signature HIPAA Notice of Privacy Received 05/23/12    HIM ROI Authorization Not Received  RCGD--06/04/12 prog note to PCP   AMB Correspondence Not Received  06/14 letter [facility]    HIM ROI Authorization Not Received  RCGD--10/08/12 prog note to PCP ([place])   AMB  Correspondence Not Received  10/14 ORDER [facility]   AMB Correspondence Not Received  11/14 [facility] alert [facility]   AMB Correspondence Not Received  01/15 letter [facility]   HIM ROI Authorization  02/26/13 RCGD--02/11/13 prog note to PCP ([place])   HIM ROI Authorization  06/18/13 RCGD--06/11/13 prog note to PCP ([place])   Insurance Card Received 09/23/13    Insurance Card Received 10/14/13 MCD/DMM/RCGD   HIM ROI Authorization  10/31/13 RCGD--10/14/13 prog note to PCP ([place])   Insurance Card Received 01/09/14 medicare and medicaid   HIM ROI Authorization  03/20/14 RCGD--03/18/14 prog note to PCP ([place])   Other Photo ID Not Received     HIM ROI Authorization  06/25/14 RCGD--06/23/14 prog note to PCP ([place])   HIM ROI Authorization  07/11/14 RCGD--07/04/14 TCS op note to PCP ([place])   HIM ROI Authorization  07/18/14 RCGD--07/14/14 GIVENS capsule report to PCP ([place])   HIM ROI Authorization  09/02/14 appeal of [phone] denied line to correct charges and units   HIM ROI Authorization  10/08/14 RCGD--10/07/14 prog note to PCP ([place])   HIM ROI Authorization  04/02/15 RCGD--01/03/14 to 04/01/15 prog notes, H&P and op notes to [facility] & [employer] Card Received 07/01/15 MCR/MCD/RCGD/HHS   AMB Correspondence  03/02/15 PROGRESS NOTES [facility]   HIM ROI Authorization  06/10/15 RCGD--06/04/15 labs to PCP ([place])   [place] E-Signature HIPAA Notice of Privacy Received 07/01/15    Advanced Beneficiary Notice (ABN) Not Received     E-Signature AOB Spanish Not Received     Advance Directives/Living Will/HCPOA/POA Received 07/14/15 Full Code   Documents for the Encounter   AOB (Assignment of Insurance Benefits) Not Received     E-signature AOB Received 07/17/15    MEDICARE RIGHTS Not Received     E-signature Medicare Rights Received 07/17/15    Cardiac Monitoring Strip  07/18/15     Admission Information    Attending Provider Admitting Provider Admission Type  Admission Date/Time   Estela Isaiah Blakes, MD Levie Heritage, DO Emergency 07/17/15 1324   Discharge Date Hospital Service Auth/Cert Status Service Area    Internal Medicine Incomplete [place] SERVICE AREA   Unit Room/Bed Admission Status   AP-ICCUP NURSING IC09/IC09-01 Admission (Confirmed)         Admission    Complaint   weakness    Hospital Account    Name Acct ID Class Status Primary Coverage   [place], Kwan [id number] Inpatient Open MEDICARE - MEDICARE PART A AND B        Guarantor Account (for Hospital Account [id number])    Name Relation to Pt Service Area Active? Acct Type   Arville Go Self [facility] Yes Personal/Family   Address Phone       [address] [place], [place] [address] [phone](H)          Coverage Information (  for Hospital Account [id number])    1. MEDICARE/MEDICARE PART A AND B    F/O Payor/Plan Precert #   MEDICARE/MEDICARE PART A AND B    Subscriber Subscriber #   [place], [place] [id number]   Address Phone   [address] [place], [place] [phone]        2. MEDICAID [place]/MEDICAID [place] ACCESS    F/O Payor/Plan Precert #   MEDICAID [place]/MEDICAID [place] ACCESS    Subscriber Subscriber #   [place], [place] [id number]   Address Phone   [address] [address] [place], [place] [address] [phone]

## 2015-07-20 NOTE — Progress Notes (Signed)
Report called to [facility] nurse. Patient transferred to room 155 via bed. Daughters present.

## 2015-07-20 NOTE — Clinical Documentation Improvement (Signed)
Hospitalist  Can the diagnosis of CHF be further specified?    Acuity - Acute, Chronic, Acute on Chronic   Type - Systolic, Diastolic, Systolic and Diastolic  Other  Clinically Undetermined   Document any associated diagnoses/conditions   Supporting Information: Noted in record to have CHF.  ECHO results available.   Please exercise your independent, professional judgment when responding. A specific answer is not anticipated or expected.   Thank Modesta Messing [facility] Health Information Management [place] [phone]

## 2015-07-20 NOTE — Care Management Important Message (Signed)
Important Message  Patient Details  Name: Eileen Santana MRN: [id number] Date of Birth: May 04, 1930   Medicare Important Message Given:  Yes    Fuller Plan, RN 07/23/2015, 12:00 PM

## 2015-07-20 NOTE — Discharge Summary (Signed)
Physician Discharge Summary  Eileen Santana CHY:[id number] DOB: [birth year] DOA: 07/17/2015  PCP: [name], MD  Admit date: 07/17/2015 Discharge date: 07/04/2015  Time spent: 45 minutes  Recommendations for Outpatient Follow-up:  Will discharged to SNF today under hospice services.   Discharge Diagnoses:  Principal Problem:   HCAP (healthcare-associated pneumonia) Active Problems:   Other pancytopenia (HCC)   Adrenal insufficiency (HCC)   Lactic acidosis   Elevated troponin I level   Acute renal failure superimposed on stage 3 chronic kidney disease (HCC)   Hypotension   Ulcer of sacral region, stage 2   Discharge Condition: Guarded  Filed Weights   07/17/15 2157 07/19/15 0500 07/25/2015 0500  Weight: 73.4 kg (161 lb 13.1 oz) 75.7 kg (166 lb 14.2 oz) 75.7 kg (166 lb 14.2 oz)    History of present illness:  As per Dr. Adrian Blackwater on 7/14: Eileen Santana is a 81 y.o. female with a history of GI bleeds, essential hypertension, DJD, second degree heart block, rheumatoid arthritis, PAF (CHADS 2 VASC score of 4) not on anticoagulation secondary to GI bleeds, stage III chronic kidney disease, steroid-induced myopathy, feet pain, ambulation with walker secondary to feet pain. She is currently at the [facility] after being hospitalized twice in the past month, first from a UTI with lactic acidosis and acute renal failure, the second with healthcare associated pneumonia from 6/28 - 7/1. Over the past couple of weeks, the patient has become weaker. Her blood pressure was in the 80s systolically upon arrival, over the patient does not complain of any fevers, chills, nausea, vomiting. She denies cough. She did tell the emergency doctor that she had some mild shortness of breath occasionally.  Hospital Course:   Hospital-acquired pneumonia -Patient and family members have elected to pursue hospice and comfort care. No further antibiotics were prescribed on discharge.  Acute  encephalopathy -Suspect related to infection plus minus steroid effect. -Will be discharged on hospice services.  Elevated troponin -Suspect due to demand ischemia.  -2-D echo: Ejection fraction of 60-65% with no wall motion abnormalities. -No further cardiac workup.  Thrombocytopenia -Appears chronic in nature, no signs of active bleeding, no indication for transfusion. -It appears etiology is thought to be secondary to hypersplenism with autoimmune hepatitis.  Atrial flutter with RVR -Rates are now controlled.  Acute on chronic kidney disease stage III -Worsening  Procedures:  None   Consultations:  None  Discharge Instructions  Discharge Instructions    Increase activity slowly    Complete by:  As directed             Medication List    STOP taking these medications        azaTHIOprine 50 MG tablet  Commonly known as:  IMURAN     ENSURE CLEAR PO     febuxostat 40 MG tablet  Commonly known as:  ULORIC     FLEET [employer]URALS CLEANSING ENEMA RE     folic acid 400 MCG tablet  Commonly known as:  FOLVITE     furosemide 20 MG tablet  Commonly known as:  LASIX     guaiFENesin-dextromethorphan 100-10 MG/5ML syrup  Commonly known as:  ROBITUSSIN DM     hydrocortisone 10 MG tablet  Commonly known as:  CORTEF     lidocaine-prilocaine cream  Commonly known as:  EMLA     metoprolol 50 MG tablet  Commonly known as:  LOPRESSOR     pantoprazole 40 MG tablet  Commonly known as:  PROTONIX     polyethylene glycol packet  Commonly known as:  MIRALAX / GLYCOLAX     RISA-BID PROBIOTIC Tabs     ursodiol 300 MG capsule  Commonly known as:  ACTIGALL      TAKE these medications        acetaminophen 325 MG tablet  Commonly known as:  TYLENOL  Take 650 mg by mouth every 6 (six) hours as needed. For pain     bisacodyl 10 MG suppository  Commonly known as:  DULCOLAX  Place 1 suppository (10 mg total) rectally as needed for moderate constipation.      colchicine 0.6 MG tablet  Take one tablet by mouth once daily     diltiazem 360 MG 24 hr capsule  Commonly known as:  TIAZAC  Take 1 capsule (360 mg total) by mouth daily.     LORazepam 1 MG tablet  Commonly known as:  ATIVAN  Take 1 tablet (1 mg total) by mouth every 2 (two) hours as needed for anxiety.     morphine CONCENTRATE 10 MG/0.5ML Soln concentrated solution  Take 0.13 mLs (2.6 mg total) by mouth every 2 (two) hours as needed for severe pain, anxiety or shortness of breath.     risperiDONE 0.25 MG tablet  Commonly known as:  RISPERDAL  Take 0.25 mg by mouth at bedtime. Hold for sedation     Travoprost (BAK Free) 0.004 % Soln ophthalmic solution  Commonly known as:  TRAVATAN  Place 1 drop into both eyes at bedtime.     Vitamin D 2000 units tablet  Take 2,000 Units by mouth daily.       Allergies  Allergen Reactions  . Gabapentin     confusion  . Iohexol Swelling    IV Dye   . Ivp Dye [Iodinated Diagnostic Agents] Swelling    Hives  . Naprosyn [Naproxen] Hives and Itching  . Red Blood Cells Swelling and Dermatitis    With blood transfusion 2012  . Verapamil     Heart block (2nd degree) Takes Cardizem   . Vicodin [Hydrocodone-Acetaminophen] Hives and Itching  . Sulfa Antibiotics Itching and Rash      The results of significant diagnostics from this hospitalization (including imaging, microbiology, ancillary and laboratory) are listed below for reference.    Significant Diagnostic Studies: Dg Chest 2 View  07/17/2015  CLINICAL DATA:  Atrial fibrillation and hypertension. Shortness of breath. EXAM: CHEST  2 VIEW COMPARISON:  July 05, 2015 FINDINGS: There is persistent cardiomegaly with pulmonary venous hypertension. There is a loculated right pleural effusion. There is mild interstitial edema. There is patchy airspace opacity in the right lower lobe. There is atherosclerotic calcification aorta. There is no apparent adenopathy. No bone lesions are evident.  IMPRESSION: Evidence of congestive heart failure. Suspect superimposed pneumonia right lower lobe. Aortic atherosclerosis. Electronically Signed   By: Bretta Bang III M.D.   On: 07/17/2015 15:50   Ct Chest Wo Contrast  07/17/2015  CLINICAL DATA:  Right lower lobe pneumonia. EXAM: CT CHEST WITHOUT CONTRAST TECHNIQUE: Multidetector CT imaging of the chest was performed following the standard protocol without IV contrast. COMPARISON:  Chest x-ray 07/17/2015. FINDINGS: Cardiovascular: The heart is enlarged. Coronary artery calcification is noted. Mitral annular calcification noted. Trace pericardial fluid associated. Atherosclerotic calcification noted in the thoracic aorta. Mediastinum/Nodes: There is no axillary lymphadenopathy. No mediastinal lymphadenopathy. No evidence for gross hilar lymphadenopathy although assessment is limited by the lack of intravenous contrast on today's study. Lungs/Pleura: Interlobular  septal thickening is identified bilaterally with small bilateral pleural effusions, right slightly more than left. Patchy areas of ill-defined nodular opacity are identified in the right upper lobe, right middle lobe with compressive atelectasis noted in the lower lobes bilaterally. There is also a small ill-defined nodule in the right lower lobe. Upper Abdomen: Liver is small and underlying cirrhosis not excluded. There is mild to moderate perihepatic fluid. Gallbladder is distended with ill-defined margins and layering high attenuation material may be related to tiny stones. Musculoskeletal: Bone windows reveal no worrisome lytic or sclerotic osseous lesions. IMPRESSION: 1. Persistent irregular nodules scattered in the right lung not substantially changed where visualized on abdomen and pelvis CT of 07/05/2015. Given the right-sided predominance, multifocal infection is favored. Neoplasm with metastatic disease would also be a consideration. Other etiologies such as septic emboli or cryptogenic  pneumonia considered less likely given predominant right-side involvement. 2. Relatively stable appearance of pleural effusions since abdomen and pelvis CT of 07/05/2015. 3. Ascites with small liver raising the question of cirrhosis. No substantial change. 4. Distended ill-defined gallbladder with layering stones or sludge. Cholecystitis not excluded by CT. Overall this is relatively stable in appearance. Electronically Signed   By: Kennith Center M.D.   On: 07/17/2015 19:28   Mr Maxine Glenn Chest Wo Contrast  07/06/2015  CLINICAL DATA:  82 year old female with sudden onset severe left flank and lower back pain EXAM: MRA CHEST ABDOMEN AND PELVIS WITH CONTRAST TECHNIQUE: Multiplanar, multiecho pulse sequences of the chest, abdomen and pelvis were obtained with intravenous contrast. Angiographic images of abdomen and pelvis were obtained using MRA technique with intravenous contrast. Unfortunately, due to extravasation of injected contrast material the postcontrast images are nondiagnostic. COMPARISON:  None. FINDINGS: MRA CHEST Limited evaluation secondary to unsuccessful intravenous contrast injection. Normal caliber thoracic aorta. A heterogeneous atherosclerotic plaque versus wall adherent mural thrombus in the descending thoracic aorta. No evidence of aneurysm or dissection. In the questioned potential dissection flap in the descending thoracic aorta almost certainly represents the margin of atherosclerotic plaque. The aortic root is normal in size. Cardiomegaly with biatrial enlargement. No pericardial effusion. Moderate right and small left layering pleural effusions with associated atelectasis. No abnormal signal scratch then no abnormal signal within the bones or soft tissues. MRA ABDOMEN FINDINGS Limited evaluation secondary to unsuccessful intravenous contrast injection. Normal caliber abdominal aorta with atherosclerotic plaque. No aneurysm or dissection. Mild tapering of the distal abdominal aorta is not  particularly un common in the setting of peripheral calcified atherosclerotic plaque in the elderly female. There is no signal change in the distal aorta or proximal iliac vessels to suggest aortoiliac occlusion. Normal signal voids present suggesting the presence of arterial flow. Very limited evaluation of the aortic branch arteries. The renal arteries are poorly seen. The celiac and SMA appear patent an without significant stenosis. Normal signal in the visualized veins. No findings to suggest venous thrombosis. Small volume perihepatic and perisplenic ascites extending along the pericolic gutters. The overall ascites volumes mild -moderate. Multiple circumscribed T2 hyperintense simple cysts in both kidneys the largest measures up to 4.4 cm in the left interpolar kidney. Mildly nodular hepatic contour consistent with ascites. Multilevel lumbar degenerative disc disease and facet arthropathy. Multiple Tarlov cysts are noted in the sacrum. Dependent body wall edema. MRI PELVIS FINDINGS Limited evaluation secondary to unsuccessful intravenous contrast injection. No evidence of aneurysm, dissection or vascular occlusion. Small to moderate ascites layers within the pelvis. Unremarkable bladder. Pelvic floor laxity. IMPRESSION: VASCULAR 1. Limited evaluation of  the vascular structure secondary to unsuccessful attempted intravenous contrast injection. 2. No convincing evidence of acute aortic pathology. There is no aneurysm N the questioned potential dissection flap in the descending thoracic aorta on the preliminary interpretation almost certainly represents the margin of heterogeneous atherosclerotic plaque rather than a true dissection flap. 3. Homogeneous signal and flow voids throughout the abdominal aorta and iliac vessels. No findings to suggest aortoiliac occlusive disease. 4.  Aortic Atherosclerosis (ICD10-170.0) 5. Cardiomegaly with biatrial enlargement. NON VASCULAR 1. Combination of bilateral pleural  effusions (right larger than left), mild-moderate ascites and body wall edema suggests CHF versus fluid overload. There is likely a component of cirrhosis trend ascites as well. 2. Morphologic changes of the liver suggests hepatic cirrhosis. Cirrhosis of liver. (SLH73-S28.76) 3. Bilateral renal cysts. 4. Multilevel degenerative disc disease and lower lumbar facet arthropathy. 5. Multiple sacral Tarlov cysts. 6. Pelvic floor laxity. Signed, Sterling Big, MD [facility] Radiology Electronically Signed   By: Malachy Moan M.D.   On: 07/06/2015 07:58   Mr Maxine Glenn Pelvis Wo Contrast  07/06/2015  CLINICAL DATA:  82 year old female with sudden onset severe left flank and lower back pain EXAM: MRA CHEST ABDOMEN AND PELVIS WITH CONTRAST TECHNIQUE: Multiplanar, multiecho pulse sequences of the chest, abdomen and pelvis were obtained with intravenous contrast. Angiographic images of abdomen and pelvis were obtained using MRA technique with intravenous contrast. Unfortunately, due to extravasation of injected contrast material the postcontrast images are nondiagnostic. COMPARISON:  None. FINDINGS: MRA CHEST Limited evaluation secondary to unsuccessful intravenous contrast injection. Normal caliber thoracic aorta. A heterogeneous atherosclerotic plaque versus wall adherent mural thrombus in the descending thoracic aorta. No evidence of aneurysm or dissection. In the questioned potential dissection flap in the descending thoracic aorta almost certainly represents the margin of atherosclerotic plaque. The aortic root is normal in size. Cardiomegaly with biatrial enlargement. No pericardial effusion. Moderate right and small left layering pleural effusions with associated atelectasis. No abnormal signal scratch then no abnormal signal within the bones or soft tissues. MRA ABDOMEN FINDINGS Limited evaluation secondary to unsuccessful intravenous contrast injection. Normal  caliber abdominal aorta with atherosclerotic plaque. No aneurysm or dissection. Mild tapering of the distal abdominal aorta is not particularly un common in the setting of peripheral calcified atherosclerotic plaque in the elderly female. There is no signal change in the distal aorta or proximal iliac vessels to suggest aortoiliac occlusion. Normal signal voids present suggesting the presence of arterial flow. Very limited evaluation of the aortic branch arteries. The renal arteries are poorly seen. The celiac and SMA appear patent an without significant stenosis. Normal signal in the visualized veins. No findings to suggest venous thrombosis. Small volume perihepatic and perisplenic ascites extending along the pericolic gutters. The overall ascites volumes mild -moderate. Multiple circumscribed T2 hyperintense simple cysts in both kidneys the largest measures up to 4.4 cm in the left interpolar kidney. Mildly nodular hepatic contour consistent with ascites. Multilevel lumbar degenerative disc disease and facet arthropathy. Multiple Tarlov cysts are noted in the sacrum. Dependent body wall edema. MRI PELVIS FINDINGS Limited evaluation secondary to unsuccessful intravenous contrast injection. No evidence of aneurysm, dissection or vascular occlusion. Small to moderate ascites layers within the pelvis. Unremarkable bladder. Pelvic floor laxity. IMPRESSION: VASCULAR 1. Limited evaluation of the vascular structure secondary to unsuccessful attempted intravenous contrast injection. 2. No convincing evidence of acute aortic pathology. There is no aneurysm N the questioned potential dissection flap in the descending thoracic aorta on the preliminary  interpretation almost certainly represents the margin of heterogeneous atherosclerotic plaque rather than a true dissection flap. 3. Homogeneous signal and flow voids throughout the abdominal aorta and iliac vessels. No findings to suggest aortoiliac occlusive disease. 4.   Aortic Atherosclerosis (ICD10-170.0) 5. Cardiomegaly with biatrial enlargement. NON VASCULAR 1. Combination of bilateral pleural effusions (right larger than left), mild-moderate ascites and body wall edema suggests CHF versus fluid overload. There is likely a component of cirrhosis trend ascites as well. 2. Morphologic changes of the liver suggests hepatic cirrhosis. Cirrhosis of liver. (ONG29-B28.41) 3. Bilateral renal cysts. 4. Multilevel degenerative disc disease and lower lumbar facet arthropathy. 5. Multiple sacral Tarlov cysts. 6. Pelvic floor laxity. Signed, Sterling Big, MD [facility] Radiology Electronically Signed   By: Malachy Moan M.D.   On: 07/06/2015 07:58   Mr Maxine Glenn Abdomen Wo Contrast  07/06/2015  CLINICAL DATA:  82 year old female with sudden onset severe left flank and lower back pain EXAM: MRA CHEST ABDOMEN AND PELVIS WITH CONTRAST TECHNIQUE: Multiplanar, multiecho pulse sequences of the chest, abdomen and pelvis were obtained with intravenous contrast. Angiographic images of abdomen and pelvis were obtained using MRA technique with intravenous contrast. Unfortunately, due to extravasation of injected contrast material the postcontrast images are nondiagnostic. COMPARISON:  None. FINDINGS: MRA CHEST Limited evaluation secondary to unsuccessful intravenous contrast injection. Normal caliber thoracic aorta. A heterogeneous atherosclerotic plaque versus wall adherent mural thrombus in the descending thoracic aorta. No evidence of aneurysm or dissection. In the questioned potential dissection flap in the descending thoracic aorta almost certainly represents the margin of atherosclerotic plaque. The aortic root is normal in size. Cardiomegaly with biatrial enlargement. No pericardial effusion. Moderate right and small left layering pleural effusions with associated atelectasis. No abnormal signal scratch then no abnormal signal within the  bones or soft tissues. MRA ABDOMEN FINDINGS Limited evaluation secondary to unsuccessful intravenous contrast injection. Normal caliber abdominal aorta with atherosclerotic plaque. No aneurysm or dissection. Mild tapering of the distal abdominal aorta is not particularly un common in the setting of peripheral calcified atherosclerotic plaque in the elderly female. There is no signal change in the distal aorta or proximal iliac vessels to suggest aortoiliac occlusion. Normal signal voids present suggesting the presence of arterial flow. Very limited evaluation of the aortic branch arteries. The renal arteries are poorly seen. The celiac and SMA appear patent an without significant stenosis. Normal signal in the visualized veins. No findings to suggest venous thrombosis. Small volume perihepatic and perisplenic ascites extending along the pericolic gutters. The overall ascites volumes mild -moderate. Multiple circumscribed T2 hyperintense simple cysts in both kidneys the largest measures up to 4.4 cm in the left interpolar kidney. Mildly nodular hepatic contour consistent with ascites. Multilevel lumbar degenerative disc disease and facet arthropathy. Multiple Tarlov cysts are noted in the sacrum. Dependent body wall edema. MRI PELVIS FINDINGS Limited evaluation secondary to unsuccessful intravenous contrast injection. No evidence of aneurysm, dissection or vascular occlusion. Small to moderate ascites layers within the pelvis. Unremarkable bladder. Pelvic floor laxity. IMPRESSION: VASCULAR 1. Limited evaluation of the vascular structure secondary to unsuccessful attempted intravenous contrast injection. 2. No convincing evidence of acute aortic pathology. There is no aneurysm N the questioned potential dissection flap in the descending thoracic aorta on the preliminary interpretation almost certainly represents the margin of heterogeneous atherosclerotic plaque rather than a true dissection flap. 3. Homogeneous  signal and flow voids throughout the abdominal aorta and iliac vessels. No findings to suggest aortoiliac occlusive  disease. 4.  Aortic Atherosclerosis (ICD10-170.0) 5. Cardiomegaly with biatrial enlargement. NON VASCULAR 1. Combination of bilateral pleural effusions (right larger than left), mild-moderate ascites and body wall edema suggests CHF versus fluid overload. There is likely a component of cirrhosis trend ascites as well. 2. Morphologic changes of the liver suggests hepatic cirrhosis. Cirrhosis of liver. (AST41-D62.22) 3. Bilateral renal cysts. 4. Multilevel degenerative disc disease and lower lumbar facet arthropathy. 5. Multiple sacral Tarlov cysts. 6. Pelvic floor laxity. Signed, Sterling Big, MD [facility] Radiology Electronically Signed   By: Malachy Moan M.D.   On: 07/06/2015 07:58   Dg Chest Port 1 View  06/20/2015  CLINICAL DATA:  Cough EXAM: PORTABLE CHEST 1 VIEW COMPARISON:  06/18/2015 chest radiograph. FINDINGS: Stable cardiomediastinal silhouette with moderate cardiomegaly. No pneumothorax. Probable trace bilateral pleural effusions. Cephalization of the pulmonary vasculature without overt pulmonary edema. No acute consolidative airspace disease. IMPRESSION: Stable moderate cardiomegaly. Cephalization of the pulmonary vasculature without overt pulmonary edema. No acute consolidative airspace disease. Electronically Signed   By: Delbert Phenix M.D.   On: 06/20/2015 16:37   Dg Abd Acute W/chest  07/05/2015  CLINICAL DATA:  Acute onset of right flank pain today. EXAM: DG ABDOMEN ACUTE W/ 1V CHEST COMPARISON:  CT scan and radiographs dated 07/01/2015 FINDINGS: There is chronic cardiomegaly with new bilateral hazy pulmonary edema and new pulmonary vascular congestion. Calcification in the arch of the aorta. No free air in the abdomen. Bowel gas pattern is normal. No acute bone abnormality. IMPRESSION: Cardiomegaly with bilateral  pulmonary edema consistent with congestive heart failure. Benign appearing abdomen. Electronically Signed   By: Francene Boyers M.D.   On: 07/05/2015 16:09   Dg Abd Acute W/chest  07/01/2015  CLINICAL DATA:  Heme-positive stool, nausea vomiting. EXAM: DG ABDOMEN ACUTE W/ 1V CHEST COMPARISON:  Chest x-ray June 20, 2015 FINDINGS: There is patchy consolidation of the lateral right lung base. There is mild interstitial edema. The heart size is enlarged. There is a small right pleural effusion. There is no evidence of bowel obstruction or free air. Degenerative joint changes of the spine are noted. IMPRESSION: Congestive heart failure. Patchy consolidation of the lateral right lung base suspicious for pneumonia. Small right pleural effusion. No evidence of bowel obstruction or free air. Electronically Signed   By: Sherian Rein M.D.   On: 07/01/2015 20:59   Ct Renal Stone Study  07/05/2015  CLINICAL DATA:  Left flank pain. Currently treated for pneumonia and UTI EXAM: CT ABDOMEN AND PELVIS WITHOUT CONTRAST TECHNIQUE: Multidetector CT imaging of the abdomen and pelvis was performed following the standard protocol without IV contrast. COMPARISON:  CT abdomen pelvis 07/01/2015 FINDINGS: Lower chest: Small bilateral pleural effusion. Patchy infiltrate in the right lung base laterally is unchanged and consistent with pneumonia. Cardiac enlargement. Coronary calcification. Mild pericardial effusion Hepatobiliary: Mild irregularity of the liver contour suggesting cirrhosis. Moderate ascites has progressed in the interval. No focal liver mass lesion. Layering density in the gallbladder may represent gallbladder sludge or gallbladder hemorrhage. Correlate with ultrasound if the patient is tender in the right upper quadrant. Bile ducts nondilated. Calcified lymph nodes in the porta hepatis. Calcified granuloma posterior right liver. Pancreas: Negative Spleen: Negative Adrenals/Urinary Tract: 5 cm midpole cyst on the left. 2 x  4 mm nonobstructing midpole stone on the left. No ureteral obstruction. 15 mm right lower pole cyst. No bladder calculi. No bladder wall thickening. Stomach/Bowel: Stomach and duodenum normal. Negative for bowel obstruction. No  bowel edema or mass lesion. Appendix not visualized Vascular/Lymphatic: Atherosclerotic aorta without aneurysm. No lymphadenopathy. Reproductive: Uterus is normal in size.  No pelvic mass. Other: Negative for hernia. Musculoskeletal: Disc and facet degeneration in the lower lumbar spine. No fracture or worrisome bone lesion. IMPRESSION: Progression of ascites.  Question cirrhosis. Layering density in the gallbladder may represent sludge or hemorrhage. Ultrasound of the gallbladder suggested if there is pain in this area. Coronary calcification and atherosclerotic aorta. Small bilateral pleural effusions. Right lower lobe infiltrate consistent with pneumonia is unchanged. Electronically Signed   By: Marlan Palau M.D.   On: 07/05/2015 15:48   Ct Renal Stone Study  07/01/2015  CLINICAL DATA:  Abdomen pain today.  Blood in the stool. EXAM: CT ABDOMEN AND PELVIS WITHOUT CONTRAST TECHNIQUE: Multidetector CT imaging of the abdomen and pelvis was performed following the standard protocol without IV contrast. COMPARISON:  March 31, 2014 FINDINGS: Lower chest: There are small bilateral pleural effusions. Consolidation of the right middle lobe is identified. There is mild atelectasis of the posterior bilateral lower lobes. The heart size is enlarged. Hepatobiliary: There is a calcified granuloma within the liver. No focal mass is identified in the liver on this noncontrast exam. The gallbladder is normal. Pancreas: No mass or inflammatory process identified on this un-enhanced exam. Spleen: Within normal limits in size. Adrenals/Urinary Tract: There is no hydronephrosis bilaterally. There are left kidney cysts, largest measures 4.3 x 3.9 cm in the midpole. There is no right kidney mass. There is a  2 mm vascular calcification versus non obstructing calcification in the midpole left kidney. The adrenal glands are normal. The bladder is normal. Stomach/Bowel: No evidence of obstruction, inflammatory process, or abnormal fluid collections. There is diverticulosis of colon without diverticulitis. The appendix is normal. Vascular/Lymphatic: Calcified lymph nodes are identified in the porta hepatis. No pathologically enlarged lymph nodes. No evidence of abdominal aortic aneurysm. There is atherosclerosis of the abdominal aorta. Reproductive: No mass or other significant abnormality. Other: Ascites is identified in the abdomen and pelvis. Musculoskeletal: Degenerative joint changes of the spine are identified. IMPRESSION: No bowel obstruction or diverticulitis. Bilateral pleural effusions with consolidation of the right middle lobe suspicious for pneumonia. Small amount of ascites in the abdomen and pelvis. Electronically Signed   By: Sherian Rein M.D.   On: 07/01/2015 21:08   US Abdomen Limited Ruq  07/05/2015  CLINICAL DATA:  Acute onset of right sided back pain today. EXAM: US ABDOMEN LIMITED - RIGHT UPPER QUADRANT COMPARISON:  CT scan dated 07/05/2015 FINDINGS: Gallbladder: There is diffuse thickening of the gallbladder wall. No visible gallstones. Common bile duct: Diameter: 3 mm, normal. Liver: No focal lesion identified. Nodularity of the liver contour. Extensive ascites. IMPRESSION: Cirrhosis and ascites. Thickening of the gallbladder wall could be due to low albumin or 14. Chronic acalculous cholecystitis could also give this appearance. No gallstones. Electronically Signed   By: Francene Boyers M.D.   On: 07/05/2015 19:26    Microbiology: Recent Results (from the past 240 hour(s))  Urine culture     Status: Abnormal   Collection Time: 07/17/15  4:46 PM  Result Value Ref Range Status   Specimen Description URINE, CLEAN CATCH  Final   Special Requests NONE  Final   Culture MULTIPLE SPECIES  PRESENT, SUGGEST RECOLLECTION (A)  Final   Report Status 07/19/2015 FINAL  Final  Culture, blood (routine x 2) Call MD if unable to obtain prior to antibiotics being given     Status: None (  Preliminary result)   Collection Time: 07/17/15  9:05 PM  Result Value Ref Range Status   Specimen Description BLOOD LEFT HAND  Final   Special Requests BOTTLES DRAWN AEROBIC AND ANAEROBIC 6CC EACG  Final   Culture NO GROWTH 3 DAYS  Final   Report Status PENDING  Incomplete  Culture, blood (routine x 2) Call MD if unable to obtain prior to antibiotics being given     Status: None (Preliminary result)   Collection Time: 07/17/15  9:09 PM  Result Value Ref Range Status   Specimen Description BLOOD LEFT HAND  Final   Special Requests BOTTLES DRAWN AEROBIC ONLY 4CC ONLY  Final   Culture NO GROWTH 3 DAYS  Final   Report Status PENDING  Incomplete  MRSA PCR Screening     Status: None   Collection Time: 07/17/15  9:35 PM  Result Value Ref Range Status   MRSA by PCR NEGATIVE NEGATIVE Final    Comment:        The GeneXpert MRSA Assay (FDA approved for NASAL specimens only), is one component of a comprehensive MRSA colonization surveillance program. It is not intended to diagnose MRSA infection nor to guide or monitor treatment for MRSA infections.      Labs: Basic Metabolic Panel:  Recent Labs Lab 07/17/15 0823 07/18/15 0514 07/19/15 0704  NA 136 137 136  K 4.0 4.7 4.3  CL 111 111 114*  CO2 20* 16* 16*  GLUCOSE 82 163* 133*  BUN 41* 42* 49*  CREATININE 2.31* 2.19* 2.25*  CALCIUM 9.3 9.5 8.9   Liver Function Tests:  Recent Labs Lab 07/17/15 1507  AST 264*  ALT 42  ALKPHOS 76  BILITOT 3.2*  PROT 5.0*  ALBUMIN 2.4*    Recent Labs Lab 07/17/15 1507  LIPASE 19   No results for input(s): AMMONIA in the last 168 hours. CBC:  Recent Labs Lab 07/17/15 0823 07/18/15 0514 07/19/15 0704  WBC 2.7* 3.6* 5.3  NEUTROABS 2.0  --   --   HGB 9.9* 10.5* 8.5*  HCT 28.6* 30.6* 24.3*   MCV 84.4 86.0 83.2  PLT 28* 21* 26*   Cardiac Enzymes:  Recent Labs Lab 07/17/15 1507 07/17/15 2105 07/18/15 0250 07/18/15 0844  TROPONINI 0.22* 0.19* 0.15* 0.16*   BNP: BNP (last 3 results)  Recent Labs  06/20/15 1534 07/01/15 1950 07/17/15 1507  BNP 552.0* 497.0* 709.0*    ProBNP (last 3 results) No results for input(s): PROBNP in the last 8760 hours.  CBG: No results for input(s): GLUCAP in the last 168 hours.     SignedChaya Jan  [facility] Pager: [phone] 08/02/2015, 11:44 AM

## 2015-07-20 NOTE — Clinical Social Work Note (Signed)
CSW notified facility that patient was discharging today.  CSW notified family that patient was discharging and returning to the facility.   CSW signing off.     Andrae Claunch, Juleen China, LCSW

## 2015-07-20 NOTE — Clinical Social Work Note (Signed)
Clinical Social Work Assessment  Patient Details  Name: Eileen Santana MRN: [id number] Date of Birth: [birth year]  Date of referral:  07/11/2015               Reason for consult:  Other (Comment Required) (From [facility])                Permission sought to share information with:    Permission granted to share information::     Name::        Agency::     Relationship::     Contact Information:     Housing/Transportation Living arrangements for the past 2 months:  [place] of Information:  Adult Children, Facility Patient Interpreter Needed:  None Criminal Activity/Legal Involvement Pertinent to Current Situation/Hospitalization:  No - Comment as needed Significant Relationships:  Adult Children, Other Family Members Lives with:  Facility Resident Do you feel safe going back to the place where you live?  Yes Need for family participation in patient care:  Yes (Comment)  Care giving concerns:  Facility resident.  Social Worker assessment / plan:  CSW has assessed patient several times recently due to multiple admissions.  Patient has been at [facility] for several weeks for rehab.  At baseline she resides with two of her daughters. CSW spoke with patient's attending, who advised that she had met with the family and they had decided that to make patient a DNR and completed a MOST form.  Attending advised that patient's family desired for patient to go back to the facility with Hospice services.  CSW met with patient's daughter, Eileen Santana, who confirmed that she desired for patient to return to [facility].  Patient was surrounded my multiple family members.  CSW spoke with Keri at [facility]. She advised that patient could return with Hospice services and that she did not need at new [facility].  CSW spoke with Nurse Casemanager about patient discharge plan to return to [facility] with Hospice.  CSW provided emotional support to patient's family.  CSW signing off.   Employment status:    Radiation protection practitioner:  Medicare, Medicaid In [place] PT Recommendations:  Not assessed at this time Information / Referral to community resources:   (Hospice at [facility])  Patient/Family's Response to care:  Family is agreeable to Hospice services at [facility].  Patient/Family's Understanding of and Emotional Response to Diagnosis, Current Treatment, and Prognosis:  Patient's family has understanding of patient's diagnosis, treatment and prognosis. They are emotionally upset currently, however they are coping through supporting each other.   Emotional Assessment Appearance:  Appears stated age Attitude/Demeanor/Rapport:   (Cooperative) Affect (typically observed):  Calm Orientation:  Oriented to Self, Oriented to Place Alcohol / Substance use:  Not Applicable Psych involvement (Current and /or in the community):  No (Comment)  Discharge Needs  Concerns to be addressed:  Discharge Planning Concerns Readmission within the last 30 days:  Yes Current discharge risk:  Chronically ill Barriers to Discharge:  No Barriers Identified   Ihor Gully, LCSW 07/29/2015, 11:19 AM

## 2015-07-21 ENCOUNTER — Non-Acute Institutional Stay (SKILLED_NURSING_FACILITY): Payer: Medicare Other | Admitting: Internal Medicine

## 2015-07-21 ENCOUNTER — Encounter: Payer: Self-pay | Admitting: Internal Medicine

## 2015-07-21 ENCOUNTER — Encounter (HOSPITAL_COMMUNITY)
Admission: AD | Admit: 2015-07-21 | Discharge: 2015-07-21 | Disposition: A | Discharge status: Home / Self Care | Payer: Medicare Other | Source: Skilled Nursing Facility | Attending: *Deleted | Admitting: *Deleted

## 2015-07-21 DIAGNOSIS — N183 Chronic kidney disease, stage 3 unspecified: Secondary | ICD-10-CM

## 2015-07-21 DIAGNOSIS — E46 Unspecified protein-calorie malnutrition: Secondary | ICD-10-CM | POA: Diagnosis not present

## 2015-07-21 DIAGNOSIS — J189 Pneumonia, unspecified organism: Secondary | ICD-10-CM | POA: Diagnosis not present

## 2015-07-21 DIAGNOSIS — N179 Acute kidney failure, unspecified: Secondary | ICD-10-CM

## 2015-07-21 DIAGNOSIS — K51811 Other ulcerative colitis with rectal bleeding: Secondary | ICD-10-CM

## 2015-07-21 DIAGNOSIS — K754 Autoimmune hepatitis: Secondary | ICD-10-CM | POA: Diagnosis not present

## 2015-07-21 DIAGNOSIS — I158 Other secondary hypertension: Secondary | ICD-10-CM

## 2015-07-21 DIAGNOSIS — I455 Other specified heart block: Secondary | ICD-10-CM | POA: Diagnosis not present

## 2015-07-21 DIAGNOSIS — D61818 Other pancytopenia: Secondary | ICD-10-CM | POA: Diagnosis not present

## 2015-07-21 NOTE — Assessment & Plan Note (Signed)
D/C colchicine

## 2015-07-21 NOTE — Patient Instructions (Signed)
D/C colchichine

## 2015-07-21 NOTE — Assessment & Plan Note (Signed)
Supportive care only 

## 2015-07-21 NOTE — Progress Notes (Signed)
   This is a nursing facility follow up for Nursing Facility readmission within 30 days. Interim medical record and care since last [facility] visit was updated with review of diagnostic studies and change in clinical status since last visit were documented.  HPI: The patient was admitted to the hospital 07/17/15 with adult failure to thrive associated with progressive weakness, hypotension and intermittent dyspnea. Chest x-ray revealed congestive heart failure and possible superimposed pneumonia in the right lower lobe. CT without contrast documented multifocal infection versus metastatic neoplasia in the RLL. Pleural effusions were stable. She also did developed aggressive renal insufficiency. Creatinine rose to 2.31 and BUN 49. GFR was 21. Pancytopenia had been present but her white count rose to 5300. HCT was 24.3 and platelet count 26,000. Because of lack of improvement in her clinical state in context of recurrent HCAP; Hospice was counseled for terminal care. She was discharged 7/17 to [facility].   Review of systems: Her daughter states that scleral icterus is progressive. Her edema improved dramatically and was essentially gone prior to discharge. She's had only 75 mL of urine output the last 8 hours. Urine is described as dark yellow but not cola colored. The family is comfortable with the decision of hospice.  Physical exam:  Pertinent or positive findings: She is arousable but is crying out that she "just want to go". I cannot appreciate significant icterus but exam was limited. She has resting exotropia of the left eye. Respirations are shallow and rapid. She has resting tachycardia with a flow murmur. 1+ edema at the ankles. Pedal pulses are decreased. Lymphatic: No lymphadenopathy about the head, neck, axilla . Eyes: No conjunctival inflammation or lid edema is present.  Nose:  External nasal examination shows no deformity or inflammation. Nasal mucosa are pink and moist  without lesions ,exudates Neck:  No thyromegaly, masses, tenderness noted.    Abdomen:Bowel sounds are normal. Abdomen is soft and nontender with no organomegaly, hernias,masses. Skin: Warm & dry w/o tenting. No significant lesions or rash.    See summary under each active problem in the Problem List with associated updated therapeutic plan

## 2015-07-21 NOTE — Assessment & Plan Note (Addendum)
Immune incompetence discussed with her daughter, Olegario Messier, who is a Engineer, civil (consulting) as healthcare associated pneumonia has not responded to multiple courses of antibiotics Hospice supportive care

## 2015-07-22 ENCOUNTER — Other Ambulatory Visit (HOSPITAL_COMMUNITY): Payer: Medicare Other

## 2015-07-22 ENCOUNTER — Ambulatory Visit (HOSPITAL_COMMUNITY): Payer: Medicare Other | Admitting: Oncology

## 2015-07-22 LAB — CULTURE, BLOOD (ROUTINE X 2)
CULTURE: NO GROWTH
CULTURE: NO GROWTH

## 2015-07-24 ENCOUNTER — Encounter: Payer: Self-pay | Admitting: Internal Medicine

## 2015-08-04 DEATH — deceased

## 2015-08-06 ENCOUNTER — Other Ambulatory Visit (HOSPITAL_COMMUNITY): Payer: Medicare Other

## 2015-08-06 ENCOUNTER — Ambulatory Visit (HOSPITAL_COMMUNITY): Payer: Medicare Other | Admitting: Oncology

## 2015-08-18 ENCOUNTER — Ambulatory Visit (INDEPENDENT_AMBULATORY_CARE_PROVIDER_SITE_OTHER): Payer: Medicare Other | Admitting: Internal Medicine

## 2015-09-04 NOTE — Progress Notes (Signed)
This encounter was created in error - please disregard.

## 2015-11-03 LAB — HIV ANTIBODY (ROUTINE TESTING W REFLEX): HIV Screen 4th Generation wRfx: NONREACTIVE

## 2015-12-12 ENCOUNTER — Other Ambulatory Visit: Payer: Self-pay | Admitting: Nurse Practitioner

## 2016-08-23 IMAGING — US US ABDOMEN LIMITED
1 series · 14 of 25 positions shown · non-contrast
Comparison: CT scan dated 07/05/2015

CLINICAL DATA: Acute onset of right sided back pain today.

EXAM:
US ABDOMEN LIMITED - RIGHT UPPER QUADRANT

[Series 1: us abdomen limited · 0.20mm/px · 14 of 49 slices shown]
[im 1/49]
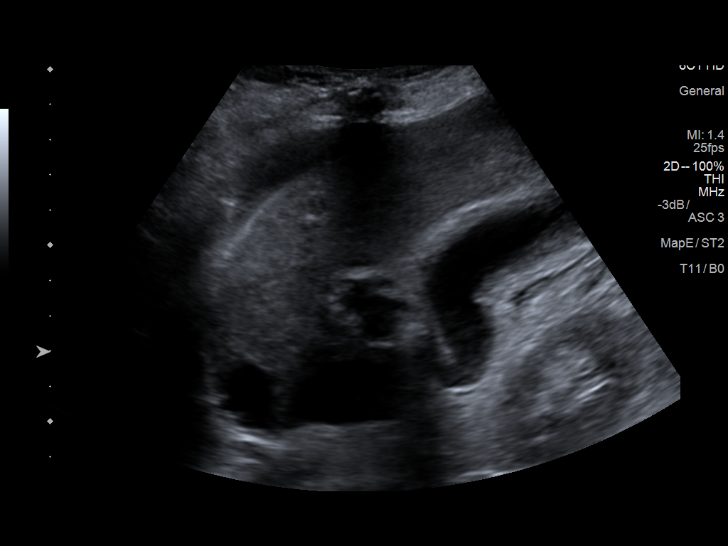
[im 5/49]
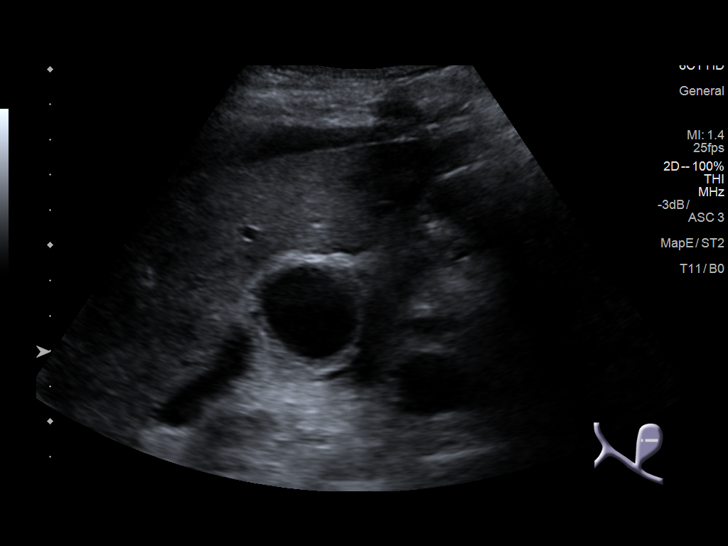
[im 9/49]
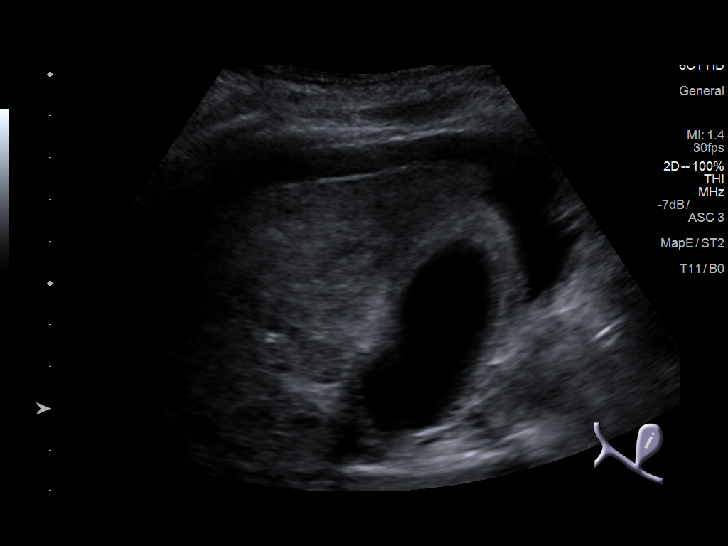
[im 13/49]
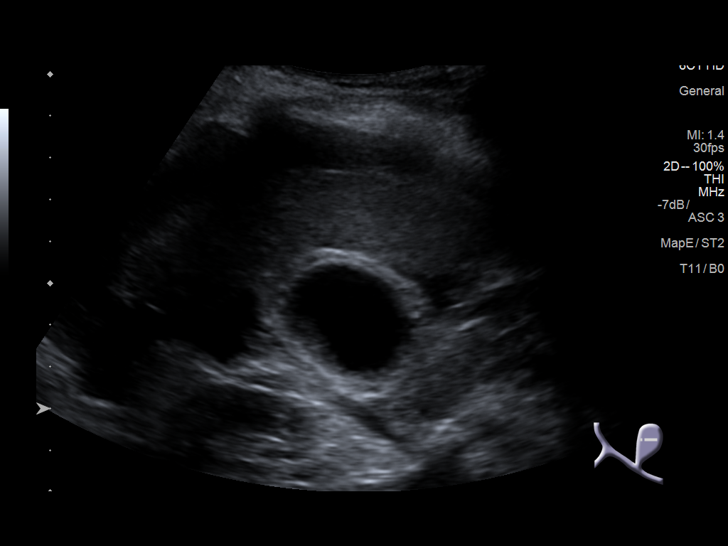
[im 17/49]
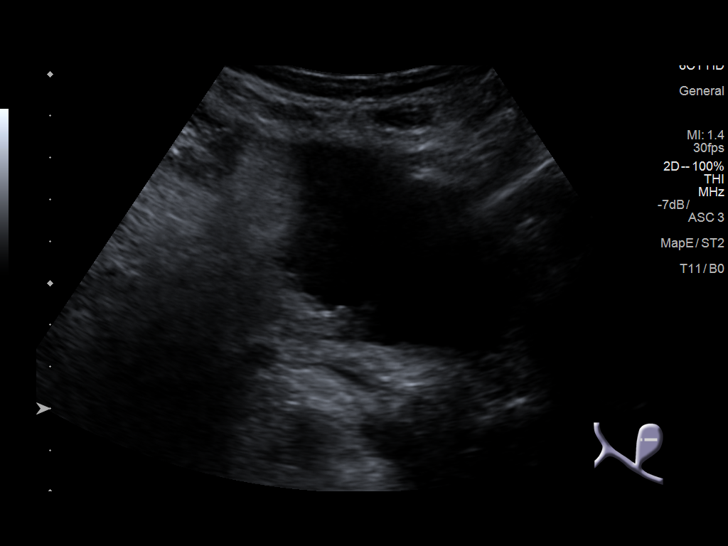
[im 19/49]
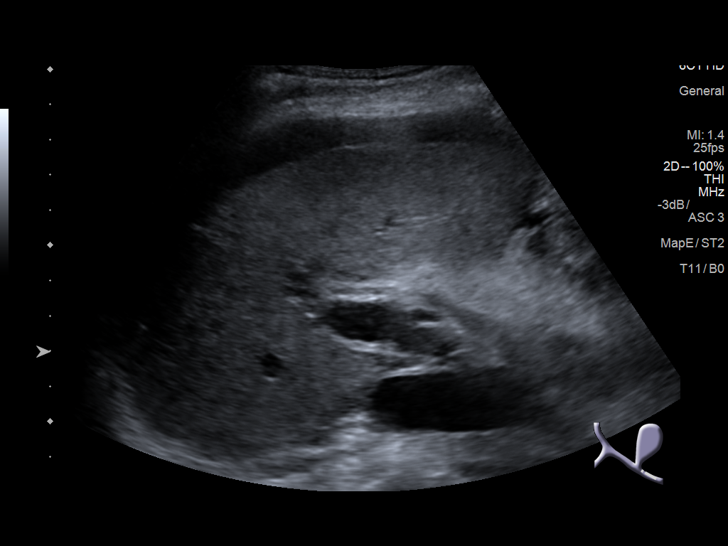
[im 23/49]
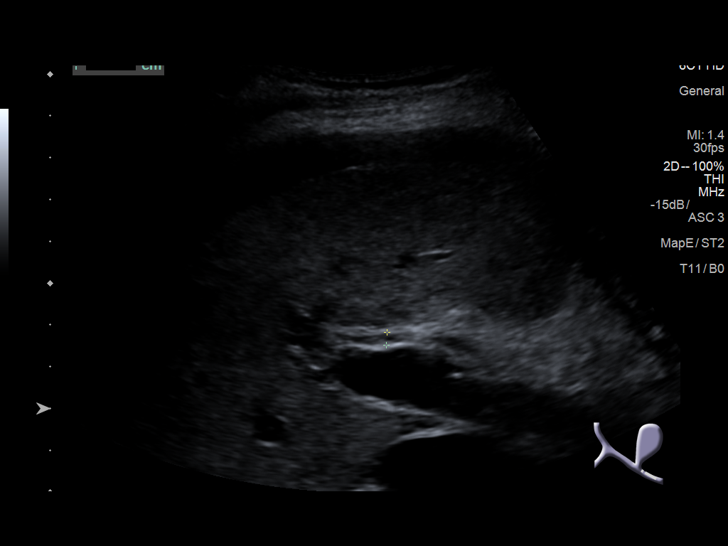
[im 27/49]
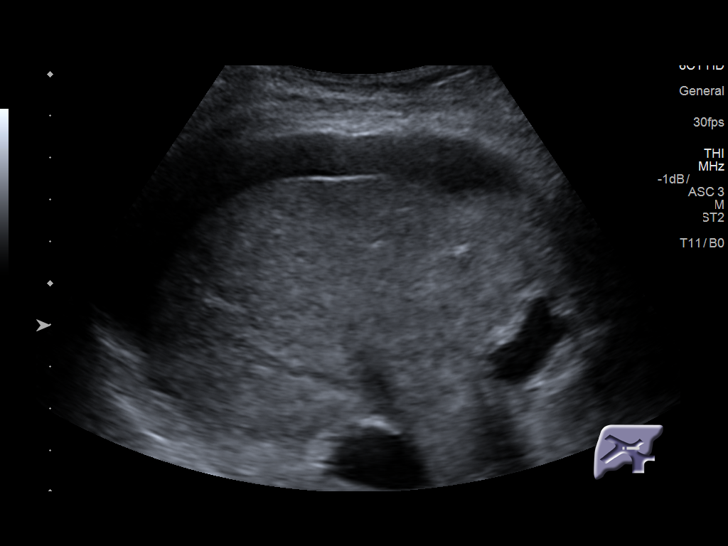
[im 31/49]
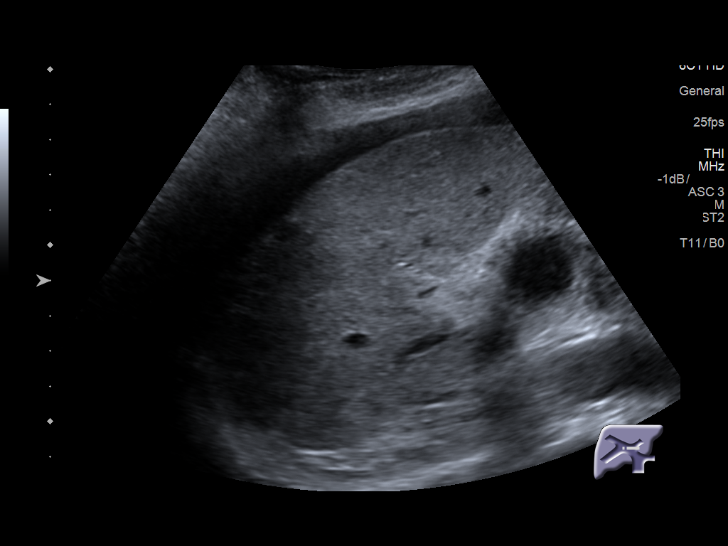
[im 33/49]
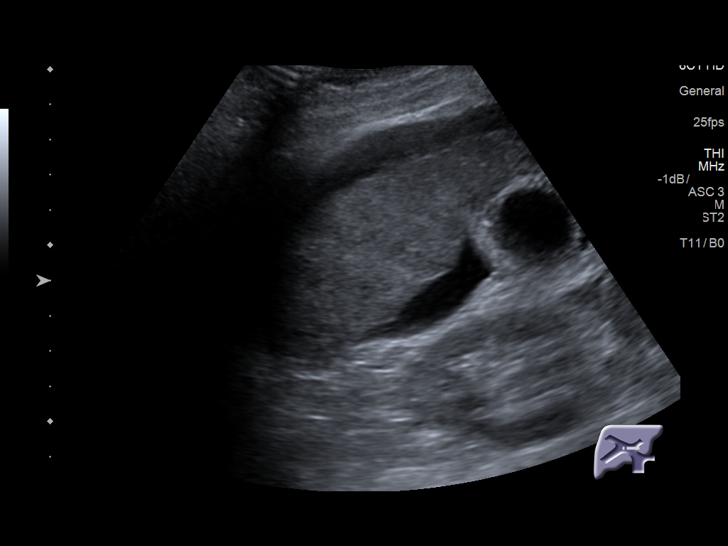
[im 37/49]
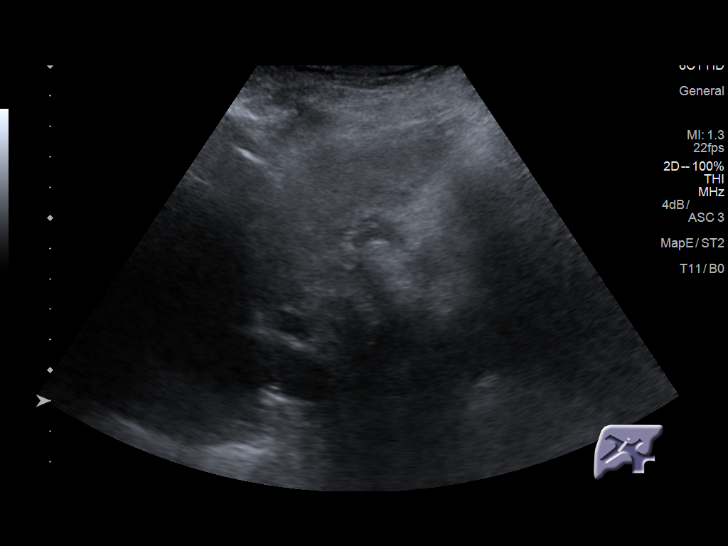
[im 41/49]
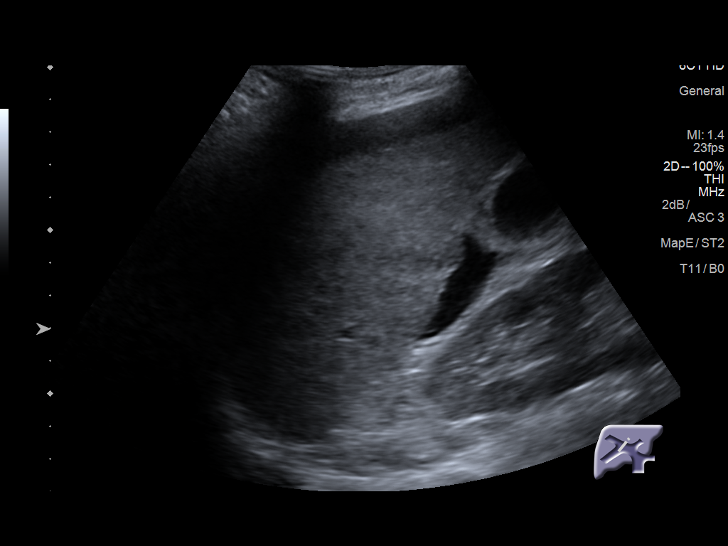
[im 45/49]
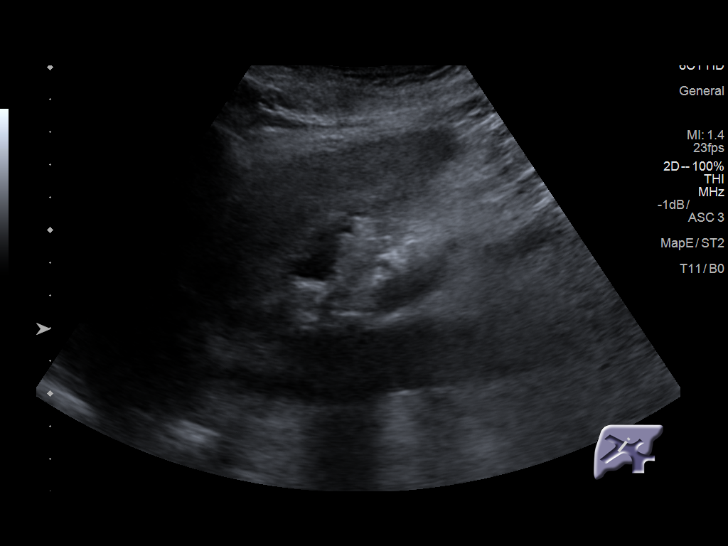
[im 49/49]
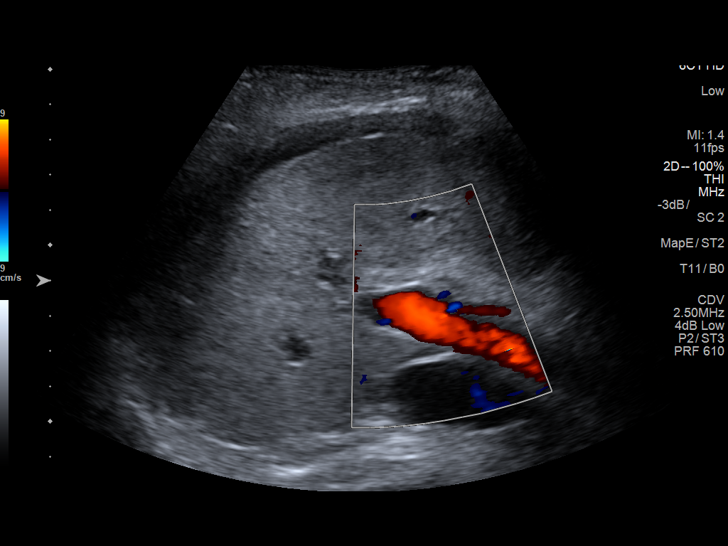

[14 of 25 positions shown; findings below may reference images not displayed]

FINDINGS: Gallbladder:

There is diffuse thickening of the gallbladder wall. No visible
gallstones.

Common bile duct:

Diameter: 3 mm, normal.

Liver:

No focal lesion identified. Nodularity of the liver contour.
Extensive ascites.
IMPRESSION: Cirrhosis and ascites. Thickening of the gallbladder wall could be
due to low albumin or 14. Chronic acalculous cholecystitis could
also give this appearance. No gallstones.

## 2017-05-19 IMAGING — CT CT RENAL STONE PROTOCOL
1 of 2 series · 15 of 32 positions shown, 19 images · non-contrast
Comparison: CT abdomen pelvis 07/01/2015

CLINICAL DATA: Left flank pain. Currently treated for pneumonia and
UTI

EXAM:
CT ABDOMEN AND PELVIS WITHOUT CONTRAST
TECHNIQUE: Multidetector CT imaging of the abdomen and pelvis was performed
following the standard protocol without IV contrast.

[Series 2: routine abd pel with · axial · 0.77mm/px · z∈[-522,-97]mm · 15 of 93 slices shown, 19 images]
[im 4/93  soft-tissue]
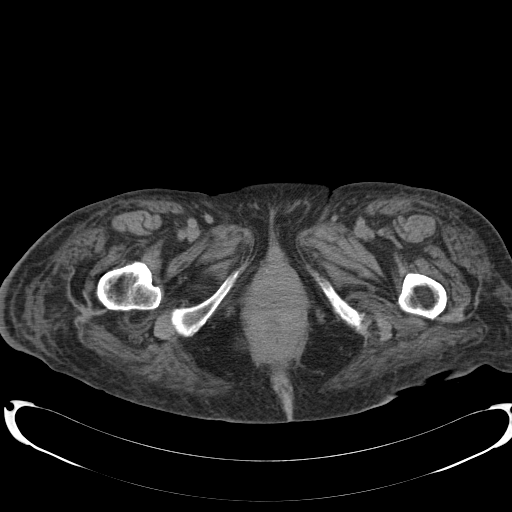
[im 4/93  bone]
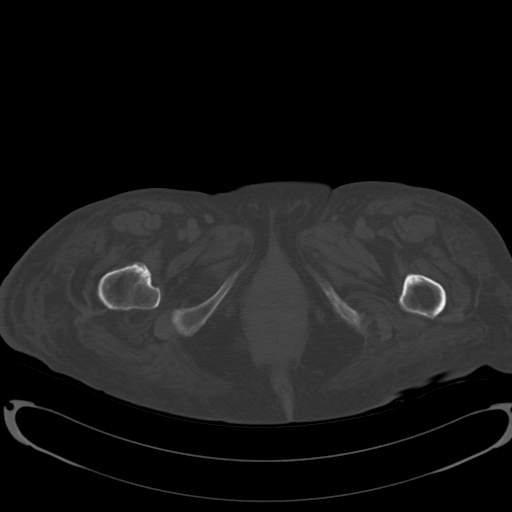
[im 12/93  soft-tissue]
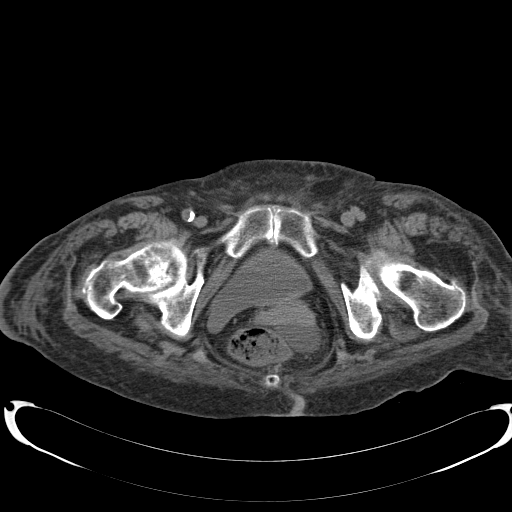
[im 19/93  soft-tissue]
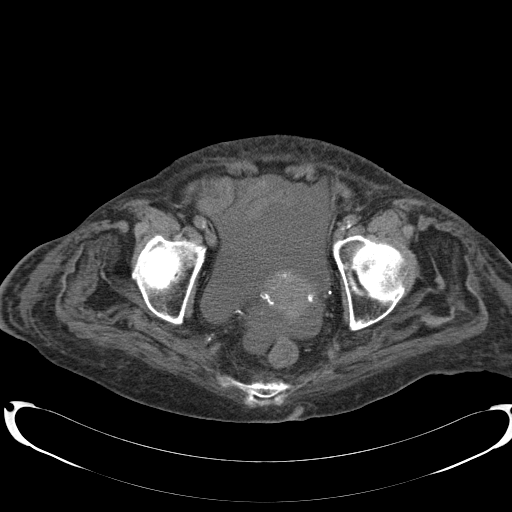
[im 26/93  soft-tissue]
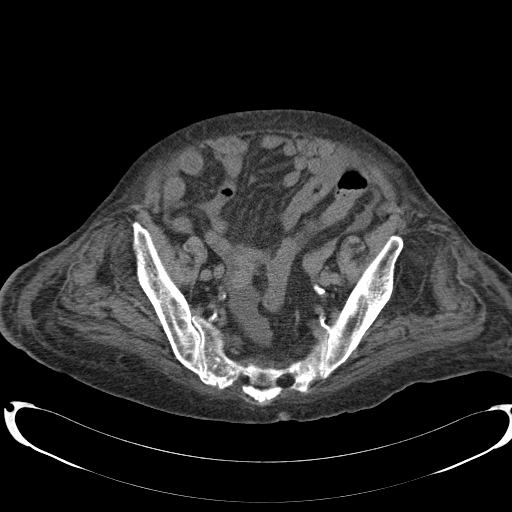
[im 34/93  soft-tissue]
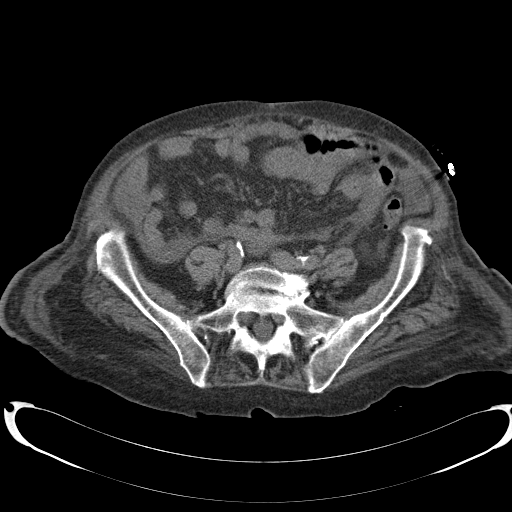
[im 41/93  soft-tissue]
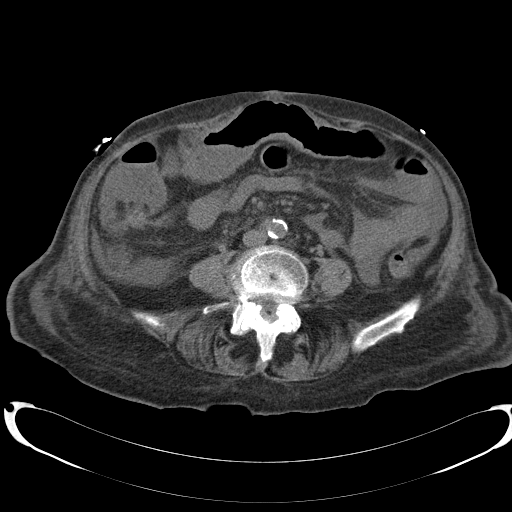
[im 48/93  soft-tissue]
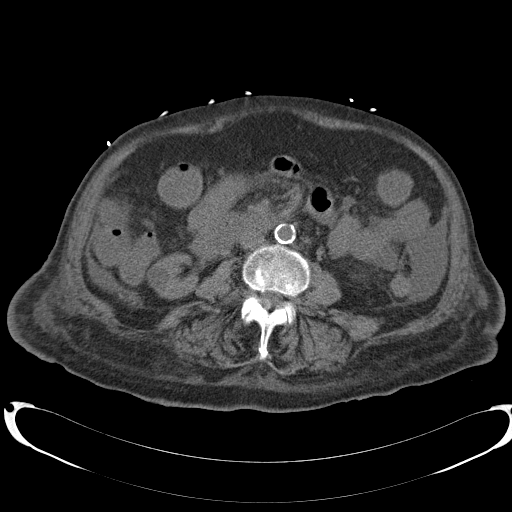
[im 52/93  soft-tissue]
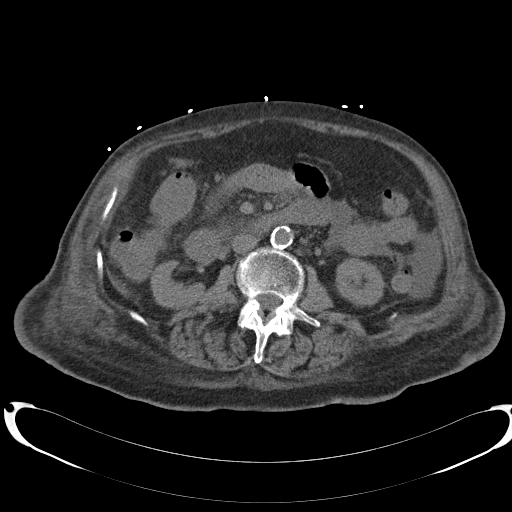
[im 59/93  soft-tissue]
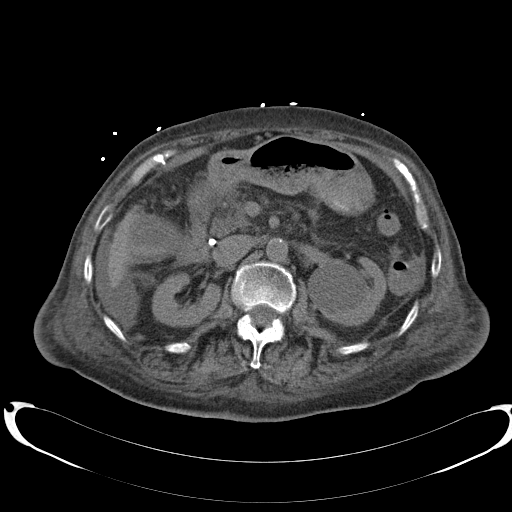
[im 59/93  bone]
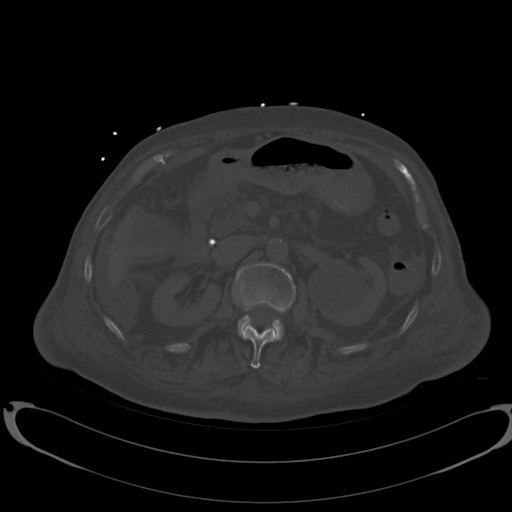
[im 67/93  soft-tissue]
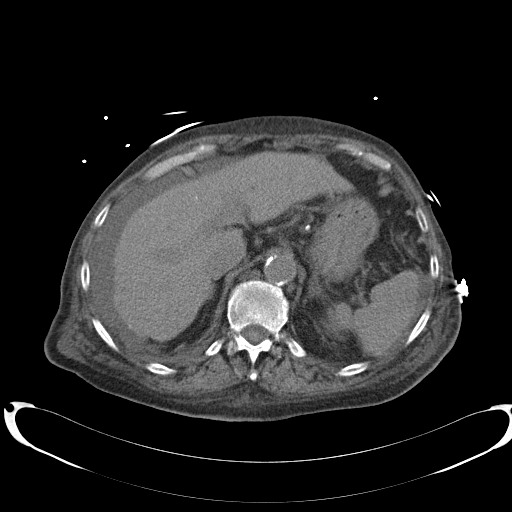
[im 74/93  soft-tissue]
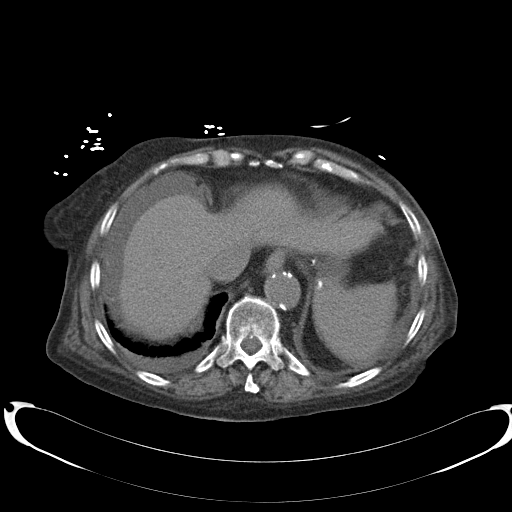
[im 78/93  lung]
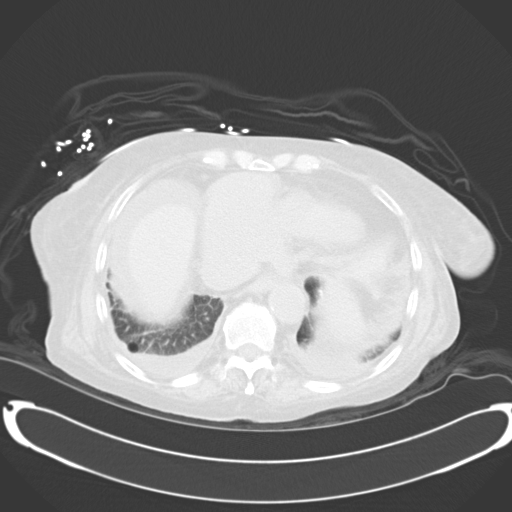
[im 81/93  soft-tissue]
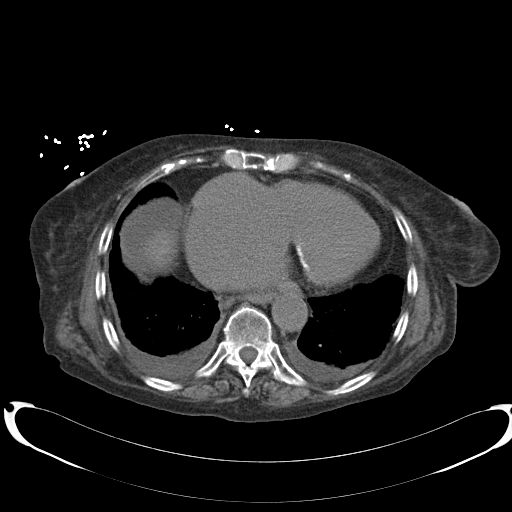
[im 81/93  lung]
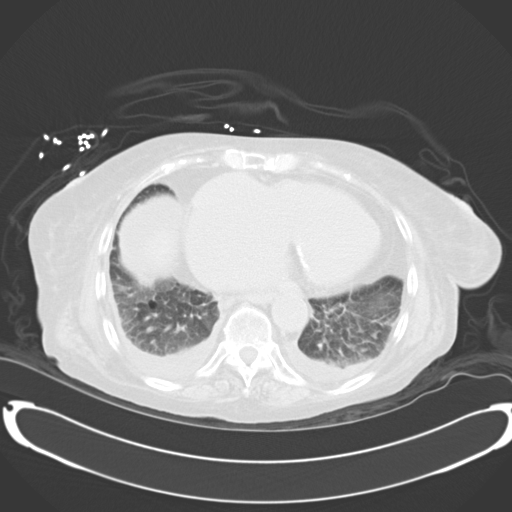
[im 85/93  lung]
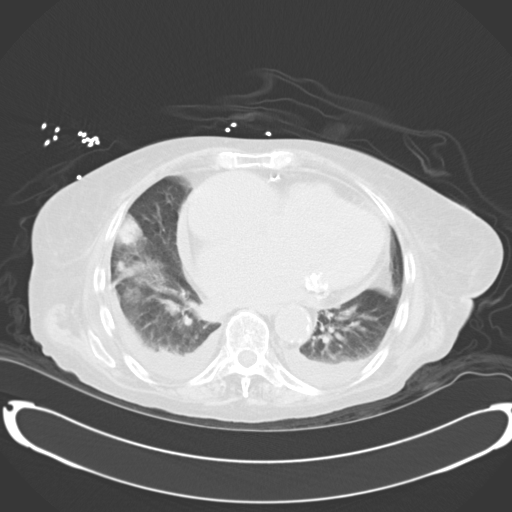
[im 89/93  soft-tissue]
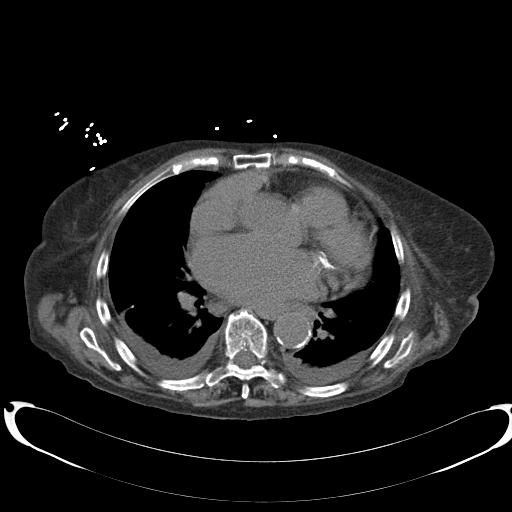
[im 89/93  lung]
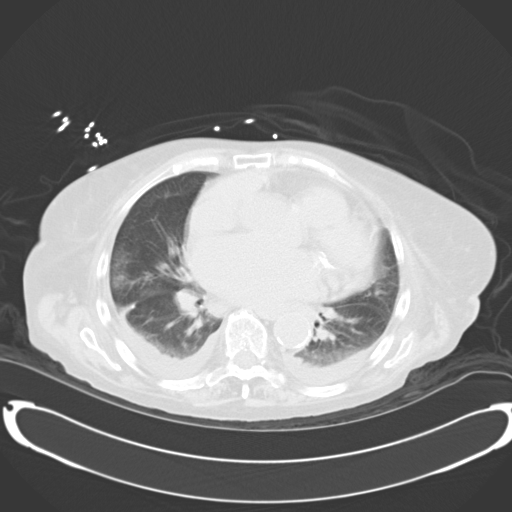

[15 of 32 positions shown; findings below may reference images not displayed]

FINDINGS: Lower chest: Small bilateral pleural effusion. Patchy infiltrate in
the right lung base laterally is unchanged and consistent with
pneumonia.

Cardiac enlargement. Coronary calcification. Mild pericardial
effusion

Hepatobiliary: Mild irregularity of the liver contour suggesting
cirrhosis. Moderate ascites has progressed in the interval. No focal
liver mass lesion. Layering density in the gallbladder may represent
gallbladder sludge or gallbladder hemorrhage. Correlate with
ultrasound if the patient is tender in the right upper quadrant.
Bile ducts nondilated. Calcified lymph nodes in the porta hepatis.
Calcified granuloma posterior right liver.

Pancreas: Negative

Spleen: Negative

Adrenals/Urinary Tract: 5 cm midpole cyst on the left. 2 x 4 mm
nonobstructing midpole stone on the left. No ureteral obstruction.
15 mm right lower pole cyst. No bladder calculi. No bladder wall
thickening.

Stomach/Bowel: Stomach and duodenum normal. Negative for bowel
obstruction. No bowel edema or mass lesion. Appendix not visualized

Vascular/Lymphatic: Atherosclerotic aorta without aneurysm. No
lymphadenopathy.

Reproductive: Uterus is normal in size.  No pelvic mass.

Other: Negative for hernia.

Musculoskeletal: Disc and facet degeneration in the lower lumbar
spine. No fracture or worrisome bone lesion.
IMPRESSION: Progression of ascites.  Question cirrhosis.

Layering density in the gallbladder may represent sludge or
hemorrhage. Ultrasound of the gallbladder suggested if there is pain
in this area.

Coronary calcification and atherosclerotic aorta.

Small bilateral pleural effusions. Right lower lobe infiltrate
consistent with pneumonia is unchanged.
